# Patient Record
Sex: Male | Born: 1955 | Race: White | Hispanic: No | Marital: Married | Smoking: Never smoker
Health system: Southern US, Community
[De-identification: ages and names within clinical notes are randomized; demographics above are authoritative.]

## PROBLEM LIST (undated history)

## (undated) DIAGNOSIS — F419 Anxiety disorder, unspecified: Secondary | ICD-10-CM

## (undated) DIAGNOSIS — F32A Depression, unspecified: Secondary | ICD-10-CM

## (undated) DIAGNOSIS — I10 Essential (primary) hypertension: Secondary | ICD-10-CM

## (undated) DIAGNOSIS — C801 Malignant (primary) neoplasm, unspecified: Secondary | ICD-10-CM

## (undated) DIAGNOSIS — F429 Obsessive-compulsive disorder, unspecified: Secondary | ICD-10-CM

## (undated) DIAGNOSIS — F329 Major depressive disorder, single episode, unspecified: Secondary | ICD-10-CM

## (undated) HISTORY — PX: HERNIA REPAIR: SHX51

## (undated) HISTORY — PX: COLON SURGERY: SHX602

## (undated) HISTORY — PX: CHOLECYSTECTOMY: SHX55

---

## 2015-10-06 ENCOUNTER — Encounter (HOSPITAL_BASED_OUTPATIENT_CLINIC_OR_DEPARTMENT_OTHER): Payer: Self-pay | Admitting: *Deleted

## 2015-10-06 ENCOUNTER — Emergency Department (HOSPITAL_BASED_OUTPATIENT_CLINIC_OR_DEPARTMENT_OTHER)
Admission: EM | Admit: 2015-10-06 | Discharge: 2015-10-06 | Disposition: A | Discharge status: Home / Self Care | Payer: 59 | Attending: Emergency Medicine | Admitting: Emergency Medicine

## 2015-10-06 ENCOUNTER — Emergency Department (HOSPITAL_BASED_OUTPATIENT_CLINIC_OR_DEPARTMENT_OTHER): Payer: 59

## 2015-10-06 DIAGNOSIS — Z760 Encounter for issue of repeat prescription: Secondary | ICD-10-CM | POA: Insufficient documentation

## 2015-10-06 DIAGNOSIS — E876 Hypokalemia: Secondary | ICD-10-CM | POA: Diagnosis not present

## 2015-10-06 DIAGNOSIS — F419 Anxiety disorder, unspecified: Secondary | ICD-10-CM | POA: Diagnosis present

## 2015-10-06 DIAGNOSIS — R002 Palpitations: Secondary | ICD-10-CM | POA: Diagnosis not present

## 2015-10-06 HISTORY — DX: Depression, unspecified: F32.A

## 2015-10-06 HISTORY — DX: Essential (primary) hypertension: I10

## 2015-10-06 HISTORY — DX: Obsessive-compulsive disorder, unspecified: F42.9

## 2015-10-06 HISTORY — DX: Anxiety disorder, unspecified: F41.9

## 2015-10-06 HISTORY — DX: Major depressive disorder, single episode, unspecified: F32.9

## 2015-10-06 HISTORY — DX: Malignant (primary) neoplasm, unspecified: C80.1

## 2015-10-06 LAB — CBC WITH DIFFERENTIAL/PLATELET
Basophils Absolute: 0.1 10*3/uL (ref 0.0–0.1)
Basophils Relative: 0 %
EOS ABS: 0.1 10*3/uL (ref 0.0–0.7)
EOS PCT: 1 %
HCT: 45.1 % (ref 39.0–52.0)
Hemoglobin: 15.9 g/dL (ref 13.0–17.0)
LYMPHS ABS: 2.1 10*3/uL (ref 0.7–4.0)
LYMPHS PCT: 19 %
MCH: 31.3 pg (ref 26.0–34.0)
MCHC: 35.3 g/dL (ref 30.0–36.0)
MCV: 88.8 fL (ref 78.0–100.0)
MONO ABS: 1.1 10*3/uL — AB (ref 0.1–1.0)
MONOS PCT: 10 %
Neutro Abs: 7.9 10*3/uL — ABNORMAL HIGH (ref 1.7–7.7)
Neutrophils Relative %: 70 %
PLATELETS: 137 10*3/uL — AB (ref 150–400)
RBC: 5.08 MIL/uL (ref 4.22–5.81)
RDW: 13.4 % (ref 11.5–15.5)
WBC: 11.3 10*3/uL — AB (ref 4.0–10.5)

## 2015-10-06 LAB — MAGNESIUM: MAGNESIUM: 2 mg/dL (ref 1.7–2.4)

## 2015-10-06 LAB — BASIC METABOLIC PANEL
Anion gap: 8 (ref 5–15)
BUN: 13 mg/dL (ref 6–20)
CO2: 25 mmol/L (ref 22–32)
CREATININE: 0.75 mg/dL (ref 0.61–1.24)
Calcium: 9.1 mg/dL (ref 8.9–10.3)
Chloride: 107 mmol/L (ref 101–111)
GFR calc Af Amer: >60 mL/min (ref >60)
GLUCOSE: 123 mg/dL — AB (ref 65–99)
Potassium: 3.7 mmol/L (ref 3.5–5.1)
SODIUM: 140 mmol/L (ref 135–145)

## 2015-10-06 LAB — T4, FREE: Free T4: 1.2 ng/dL — ABNORMAL HIGH (ref 0.61–1.12)

## 2015-10-06 MED ORDER — HALOPERIDOL 1 MG PO TABS
1.0000 mg | ORAL_TABLET | Freq: Once | ORAL | Status: DC
  Filled 2015-10-06: qty 1

## 2015-10-06 MED ORDER — LORAZEPAM 1 MG PO TABS
1.0000 mg | ORAL_TABLET | Freq: Once | ORAL | Status: AC
  Administered 2015-10-06: 1 mg via ORAL
  Filled 2015-10-06: qty 1

## 2015-10-06 MED ORDER — DIPHENHYDRAMINE HCL 25 MG PO CAPS
50.0000 mg | ORAL_CAPSULE | Freq: Once | ORAL | Status: AC
  Administered 2015-10-06: 50 mg via ORAL
  Filled 2015-10-06: qty 2

## 2015-10-06 MED ORDER — POTASSIUM CHLORIDE CRYS ER 20 MEQ PO TBCR
40.0000 meq | EXTENDED_RELEASE_TABLET | Freq: Once | ORAL | Status: AC
  Administered 2015-10-06: 40 meq via ORAL
  Filled 2015-10-06: qty 2

## 2015-10-06 MED ORDER — LORAZEPAM 1 MG PO TABS
2.0000 mg | ORAL_TABLET | Freq: Once | ORAL | Status: DC
Start: 2015-10-06 — End: 2015-10-06

## 2015-10-06 MED ORDER — HALOPERIDOL 5 MG PO TABS
5.0000 mg | ORAL_TABLET | Freq: Once | ORAL | Status: AC
  Administered 2015-10-06: 5 mg via ORAL
  Filled 2015-10-06: qty 1

## 2015-10-06 MED ORDER — LORAZEPAM 1 MG PO TABS: 1.0000 mg | ORAL_TABLET | Freq: Two times a day (BID) | ORAL | Status: DC | PRN

## 2015-10-06 NOTE — ED Notes (Signed)
Denies sx, "feel better", steady gait, alert, NAD, calm, interactive, no dyspnea noted, out with family to d/c desk, given rx x1

## 2015-10-06 NOTE — ED Notes (Addendum)
C/o 1 week of increased and progressive anxiety, heart racing, palpitations, inability to sleep (difficulty falling asleep and staying asleep, sleeps about 4-5 hrs at a time).  Reports recent stressors (father died). Admits to h/o anxiety and depression. No relief tonight after taking lexapro10mg  at 2000, and clonazepam 0.5mg  and ambien 10mg  at 2300. Hand tremor and irregular pulse noted.  Denies ETOH smoking or drug use. Mentions some recent, but not current congestion.

## 2015-10-06 NOTE — ED Notes (Signed)
Dr. Claudine Mouton into room, family at [facility].

## 2015-10-06 NOTE — ED Notes (Signed)
Pt to xray, no changes, calmer, VSS.

## 2015-10-06 NOTE — ED Provider Notes (Signed)
CSN: [id number]     Arrival date & time 10/06/15  0404 History   First MD Initiated Contact with Patient 10/06/15 0410     Chief Complaint  Patient presents with  . Anxiety     (Consider location/radiation/quality/duration/timing/severity/associated sxs/prior Treatment) HPI  Joshua Eaton is a 60yo male, h/o anxiety, here with an attack. He states his symptoms  Have worsened over the last week. He hs had recent tress with his ather ho passed away in his mother who is in a nursing home. He denies alcohol or illicit drug use. He took several medications tonight including Lexapro, clonazepam, and Ambien to help with his sleeping and anxiety but they did not help.He is here requesting medication for this.  He does have palpitations but denies any pain  He has no SOB, vomiting or diaphoresis. He states this is consistent with his anxiety.   10 Systems reviewed and are negative for acute change except as noted in the HPI.   No past medical history on file. No past surgical history on file. No family history on file. Social History  Substance Use Topics  . Smoking status: Not on file  . Smokeless tobacco: Not on file  . Alcohol Use: Not on file    Review of Systems    Allergies  Review of patient's allergies indicates not on file.  Home Medications   Prior to Admission medications   Not on File   BP 180/100 mmHg  Pulse 92  Temp(Src) 98.4 F (36.9 C) (Oral)  Resp 18  Ht 5\' 10"  (1.778 m)  Wt 197 lb (89.359 kg)  BMI 28.27 kg/m2  SpO2 97% Physical Exam  Constitutional: He is oriented to person, place, and time. Vital signs are normal. He appears well-developed and well-nourished.  Non-toxic appearance. He does not appear ill. No distress.  HENT:  Head: Normocephalic and atraumatic.  Nose: Nose normal.  Mouth/Throat: Oropharynx is clear and moist. No oropharyngeal exudate.  Eyes: Conjunctivae and EOM are normal. Pupils are equal, round, and reactive to light. No scleral icterus.   Neck: Normal range of motion. Neck supple. No tracheal deviation, no edema, no erythema and normal range of motion present. No thyroid mass and no thyromegaly present.  Cardiovascular: Normal rate, regular rhythm, S1 normal, S2 normal, normal heart sounds, intact distal pulses and normal pulses.  Exam reveals no gallop and no friction rub.   No murmur heard. Pulmonary/Chest: Effort normal and breath sounds normal. No respiratory distress. He has no wheezes. He has no rhonchi. He has no rales.  Abdominal: Soft. Normal appearance and bowel sounds are normal. He exhibits no distension, no ascites and no mass. There is no hepatosplenomegaly. There is no tenderness. There is no rebound, no guarding and no CVA tenderness.  Musculoskeletal: Normal range of motion. He exhibits no edema or tenderness.  Lymphadenopathy:    He has no cervical adenopathy.  Neurological: He is alert and oriented to person, place, and time. He has normal strength. No cranial nerve deficit or sensory deficit.  Skin: Skin is warm, dry and intact. No petechiae and no rash noted. He is not diaphoretic. No erythema. No pallor.  Psychiatric:  anxious  Nursing note and vitals reviewed.   ED Course  Procedures (including critical care time) Labs Review Labs Reviewed  CBC WITH DIFFERENTIAL/PLATELET - Abnormal; Notable for the following:    WBC 11.3 (*)    Platelets 137 (*)    Neutro Abs 7.9 (*)    Monocytes Absolute 1.1 (*)  All other components within normal limits  BASIC METABOLIC PANEL - Abnormal; Notable for the following:    Glucose, Bld 123 (*)    All other components within normal limits  MAGNESIUM  TSH  T4, FREE    Imaging Review Dg Chest 2 View  10/06/2015  CLINICAL DATA:  Palpitations EXAM: CHEST  2 VIEW COMPARISON:  None. FINDINGS: Normal heart size and mediastinal contours. No acute infiltrate or edema. No effusion or pneumothorax. No acute osseous findings. Cholecystectomy clips IMPRESSION: No active  cardiopulmonary disease. Electronically Signed   By: Monte Fantasia M.D.   On: 10/06/2015 05:44   I have personally reviewed and evaluated these images and lab results as part of my medical decision-making.   EKG Interpretation None      MDM   Final diagnoses:  None   Patient given ativan, benadryl and haldol for relief, PO.  EKG show PVCs, potassium mildly low and was replaced.  Labs otherwise unremarkable.  Will follow up on thyroid studies.  Upon repeat evaluation, patient appears much more calm.  He is requesting ativan to go home with, he was given 6 tabs and instructed to stop taking the clonazepam completely.  VS remain within his normal limits and he is safe for DC with PCP fu within 3 Days.   He appears well and in NAD. VS remain within his normal limits and he is safe for DC.    Everlene Balls, MD 10/06/15 [phone]

## 2015-10-06 NOTE — Discharge Instructions (Signed)
Palpitations Mr. Chaplain, your EKG, blood work, and chest xray today were normal.  Take ativan as needed for your anxiety and see your doctor on Monday at your scheduled appointment.  Do NOT mix the clonazepam with the ativan.  Stop taking the clonazepam. If any symptoms worsen, come back to the ED Immediately. Thank you. A palpitation is the feeling that your heartbeat is irregular. It may feel like your heart is fluttering or skipping a beat. It may also feel like your heart is beating faster than normal. This is usually not a serious problem. In some cases, you may need more medical tests. HOME CARE  Avoid:  Caffeine in coffee, tea, soft drinks, diet pills, and energy drinks.  Chocolate.  Alcohol.  Stop smoking if you smoke.  Reduce your stress and anxiety. Try:  A method that measures bodily functions so you can learn to control them (biofeedback).  Yoga.  Meditation.  Physical activity such as swimming, jogging, or walking.  Get plenty of rest and sleep. GET HELP IF:  Your fast or irregular heartbeat continues after 24 hours.  Your palpitations occur more often. GET HELP RIGHT AWAY IF:   You have chest pain.  You feel short of breath.  You have a very bad headache.  You feel dizzy or pass out (faint). MAKE SURE YOU:   Understand these instructions.  Will watch your condition.  Will get help right away if you are not doing well or get worse.   This information is not intended to replace advice given to you by your health care provider. Make sure you discuss any questions you have with your health care provider.   Document Released: 05/26/2008 Document Revised: 09/07/2014 Document Reviewed: 10/16/2011 Elsevier Interactive Patient Education [employer].

## 2015-10-06 NOTE — ED Notes (Signed)
Dr. Claudine Mouton into room to update pt and family with results and plan. Pt up to b/r to void, steady gait.

## 2015-10-07 LAB — TSH: TSH: 1.07 u[IU]/mL (ref 0.350–4.500)

## 2015-12-05 ENCOUNTER — Encounter (HOSPITAL_COMMUNITY): Payer: Self-pay | Admitting: *Deleted

## 2015-12-05 ENCOUNTER — Inpatient Hospital Stay (HOSPITAL_COMMUNITY)
Admission: AD | Admit: 2015-12-05 | Discharge: 2015-12-11 | DRG: 881 | Disposition: A | Discharge status: Home / Self Care | Payer: 59 | Attending: Psychiatry | Admitting: Psychiatry

## 2015-12-05 DIAGNOSIS — I10 Essential (primary) hypertension: Secondary | ICD-10-CM | POA: Diagnosis present

## 2015-12-05 DIAGNOSIS — G4700 Insomnia, unspecified: Secondary | ICD-10-CM | POA: Diagnosis present

## 2015-12-05 DIAGNOSIS — F329 Major depressive disorder, single episode, unspecified: Principal | ICD-10-CM | POA: Diagnosis present

## 2015-12-05 DIAGNOSIS — F429 Obsessive-compulsive disorder, unspecified: Secondary | ICD-10-CM | POA: Diagnosis present

## 2015-12-05 DIAGNOSIS — F419 Anxiety disorder, unspecified: Secondary | ICD-10-CM | POA: Diagnosis not present

## 2015-12-05 DIAGNOSIS — F418 Other specified anxiety disorders: Secondary | ICD-10-CM | POA: Diagnosis not present

## 2015-12-05 DIAGNOSIS — F332 Major depressive disorder, recurrent severe without psychotic features: Secondary | ICD-10-CM | POA: Diagnosis not present

## 2015-12-05 DIAGNOSIS — F32A Depression, unspecified: Secondary | ICD-10-CM | POA: Diagnosis present

## 2015-12-05 MED ORDER — CLONAZEPAM 1 MG PO TABS
1.0000 mg | ORAL_TABLET | Freq: Three times a day (TID) | ORAL | Status: DC
  Administered 2015-12-05 – 2015-12-06 (×2): 1 mg via ORAL
  Filled 2015-12-05 (×2): qty 1

## 2015-12-05 MED ORDER — ACETAMINOPHEN 325 MG PO TABS: 650.0000 mg | ORAL_TABLET | Freq: Four times a day (QID) | ORAL | Status: DC | PRN

## 2015-12-05 MED ORDER — ZOLPIDEM TARTRATE 5 MG PO TABS
15.0000 mg | ORAL_TABLET | Freq: Every evening | ORAL | Status: DC | PRN
  Administered 2015-12-05: 15 mg via ORAL
  Filled 2015-12-05: qty 1

## 2015-12-05 MED ORDER — ALUM & MAG HYDROXIDE-SIMETH 200-200-20 MG/5ML PO SUSP: 30.0000 mL | ORAL | Status: DC | PRN

## 2015-12-05 MED ORDER — CLONAZEPAM 0.5 MG PO TABS: 0.5000 mg | ORAL_TABLET | Freq: Two times a day (BID) | ORAL | Status: DC | PRN

## 2015-12-05 MED ORDER — ESCITALOPRAM OXALATE 10 MG PO TABS
10.0000 mg | ORAL_TABLET | Freq: Every day | ORAL | Status: DC
  Filled 2015-12-05 (×2): qty 1

## 2015-12-05 MED ORDER — CLONAZEPAM 1 MG PO TABS
1.0000 mg | ORAL_TABLET | Freq: Three times a day (TID) | ORAL | Status: DC
  Filled 2015-12-05: qty 1

## 2015-12-05 MED ORDER — CLONAZEPAM 1 MG PO TABS
1.0000 mg | ORAL_TABLET | Freq: Once | ORAL | Status: AC
  Administered 2015-12-05: 1 mg via ORAL
  Filled 2015-12-05: qty 1

## 2015-12-05 MED ORDER — MAGNESIUM HYDROXIDE 400 MG/5ML PO SUSP: 30.0000 mL | Freq: Every day | ORAL | Status: DC | PRN

## 2015-12-05 MED ORDER — CLONAZEPAM 0.5 MG PO TABS: 0.5000 mg | ORAL_TABLET | Freq: Three times a day (TID) | ORAL | Status: DC | PRN

## 2015-12-05 MED ORDER — ZOLPIDEM TARTRATE 5 MG PO TABS: 15.0000 mg | ORAL_TABLET | Freq: Every evening | ORAL | Status: DC | PRN

## 2015-12-05 MED ORDER — HYDROXYZINE HCL 50 MG PO TABS
50.0000 mg | ORAL_TABLET | Freq: Three times a day (TID) | ORAL | Status: DC | PRN
  Administered 2015-12-05 – 2015-12-09 (×6): 50 mg via ORAL
  Filled 2015-12-05 (×6): qty 1

## 2015-12-05 MED ORDER — ZOLPIDEM TARTRATE 10 MG PO TABS: 10.0000 mg | ORAL_TABLET | Freq: Every evening | ORAL | Status: DC | PRN

## 2015-12-05 NOTE — Progress Notes (Signed)
Admission Note:  D- 60 yr old male who presents as a walk-in, in no acute distress, for the treatment of SI and anxiety. Patient appears anxious and depressed. Patient was cooperative with admission process. Patient presents with passive SI without a plan and contracts for safety upon admission. Patient reports, prior to admission, he was having "uneasy feelings" and "obessive thoughts to harm myself".  Patient reports "I didn't want to be left alone by myself".  Patient denies AVH . Patient reports multiple stressors.  Patient reports that at the beginning of the year both of his parents and his sister passed away in an 32 day period.  Patient also reports, 2 days after his parent's death, he was notified that he had a diagnosis of Macular Degeneration.  Patient currently reports minimal sight in his left eye.  Patient reports taking Areds for his eyes.  Patient reports that he was previously taking Zoloft to help with his OCD but was recently switched to Zyprexa.  Patient states that he had taken 5 doses of Zyprexa, as prescribed, and began feeling "dizzy and lightheaded".  Patient states "the medication set something off in my head and now everyone thinks I'm looney".  Patient reports that he had a CT Scan on Saturday and it came back "clear".    Patient has past medical Hx of irregular heart beat, OCD, and anxiety.  Patient reports that he was diagnosed with colon cancer 10 years ago which resulted in doctors removing part of his colon.  Patient reports decreased appetite which has resulted in him losing 8-10lbs in "the last couple of weeks".  Patient reports poor sleep and reports sleeping an average of 3 hours per night.  Denies tobacco, drug, or alcohol use.  Denies physical, verbal, or sexual abuse.   A- Skin was assessed and found to be clear of any abnormal marks apart from a scab on his right arm which patient reports is from "picking" at dry skin. Patient searched and no contraband found, POC and unit  policies explained and understanding verbalized. Consents obtained. Food and fluids offered and fluids accepted.  R- Patient had no additional questions or concerns.

## 2015-12-05 NOTE — Progress Notes (Signed)
Adult Psychoeducational Group Note  Date:  12/05/2015 Time:  9:25 PM  Group Topic/Focus:  Wrap-Up Group:   The focus of this group is to help patients review their daily goal of treatment and discuss progress on daily workbooks.  Participation Level:  Active  Participation Quality:  Appropriate  Affect:  Appropriate  Cognitive:  Alert  Insight: Appropriate  Engagement in Group:  Engaged  Modes of Intervention:  Discussion  Additional Comments:  Patient goal for today was to get to [name]. Patient stated his anxiety is bad. On a scale between 1-10, (1=worse, 10=best) patient rated his day a 2.  [name] [name] 12/05/2015, 9:25 PM

## 2015-12-05 NOTE — BH Assessment (Addendum)
Tele Assessment Note   Joshua Eaton is an 60 y.o. male. Pt presents voluntarily to [facility] for assessment accompanied by his wife of 84 years, Joshua Eaton. Pt endorses SI with severe anxiety and panic attacks. Pt reports he has been inpatient at [facility] twice over the past six mos. He reports he is currently having "thoughts of harming myself.". Pt denies hx of suicide attempts. He is unable to contract for safety. He endorses severe anxiety with panic attacks. Pt reports his anxiety is greatly affecting his quality of life. Pt reports he doesn't leave his house anymore and doesn't interact with 18 yo grandson like he used to. He endorses severe anxiety during assessment and reports he is afraid he may have a panic attack. He says, "My anxiety just takes over. It is terrible." He endorses insomnia, poor appetite, loss of interest in usual pleasures, hopelessness, isolating bx and irritability. Pt says he has been having more SI lately. Pt reports his wife has had to stay home with him the past week d/t his anxiety. He says he is afraid to be alone. He says he has seen Dr [facility] Eaton twice recently for med management. He reports Dr [facility] Eaton is his psychaitrist, however in EPIC Dr [facility] E[facility] is listed as his PCP. Pt reports Dr Joshua Eaton prescribes Clonazepam 1 mg. Pt reports having OCD. He denies any compulsive behavior.   Wife reports pt's mood has been "doom and gloom." She says pt is won't leave the house. Wife reports pt used to be outgoing and very social. She says pt's father, sister and mother have all died in the past three months. She also reports pt has recently been dx with macular degeneration. Wife says pt's anxiety and depression worsened approx six months ago when his parents' health began going quickly downhill. She reports pt is very sensitive to medications. She says she is worried as [facility] professionals haven't seemed to be able to find psych meds which will decrease pt's anxiety.   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   Past Medical History:  Past Medical History  Diagnosis Date  . Anxiety   . Depression   . OCD (obsessive compulsive disorder)   . Hypertension   . Cancer [facility])     Past Surgical History  Procedure Laterality Date  . Colon surgery      cancerous polyp removed  . Cholecystectomy    . Hernia repair      Family History: No family history on file.  Social History:  reports that he has never smoked. He does not have any smokeless tobacco history on file. He reports that he does not drink alcohol or use illicit drugs.  Additional Social History:  Alcohol / Drug Use Pain Medications: pt denies abuse = see PTA meds list Prescriptions: pt denies abuse = see PTA meds list Over the Counter: pt denies abuse -see PTA meds list History of alcohol / drug use?: No history of alcohol / drug abuse Longest period of sobriety (when/how long): n/a  CIWA:   COWS:    PATIENT STRENGTHS: (choose at least two) Ability for insight Average or above average intelligence Communication skills Physical Health Supportive family/friends  Allergies:  Allergies  Allergen Reactions  . Codeine     "sends me into orbit"    Home Medications:  Medications Prior to Admission  Medication Sig Dispense Refill  . escitalopram (LEXAPRO) 10 MG tablet Take 10 mg by  mouth daily.    Marland Kitchen LORazepam (ATIVAN) 1 MG tablet Take 1 tablet (1 mg total) by mouth 2 (two) times daily as needed for anxiety. 6 tablet 0  . zolpidem (AMBIEN) 10 MG tablet Take 10 mg by mouth at bedtime as needed for sleep.      OB/GYN Status:  No LMP for male patient.  General Assessment Data Location of Assessment: [facility] TTS Assessment: In system Is this a Tele or Face-to-Face Assessment?: Face-to-Face Is this an Initial Assessment or a Re-assessment for this encounter?: Initial Assessment Marital  status: Married Is patient pregnant?: No Pregnancy Status: No Living Arrangements: Spouse/significant other (wife of 65 yrs) Can pt return to current living arrangement?: Yes Admission Status: Voluntary Is patient capable of signing voluntary admission?: Yes Referral Source: Self/Family/Friend Insurance type: united healthcare     Crisis Care Plan Living Arrangements: Spouse/significant other (wife of 63 yrs) Name of Psychiatrist: Dr Joshua Eaton (has only seen MD twice) Name of Therapist: none  Education Status Is patient currently in school?: No Highest grade of school patient has completed: 9  Risk to self with the past 6 months Suicidal Ideation: Yes-Currently Present Has patient been a risk to self within the past 6 months prior to admission? : No Suicidal Intent: No Has patient had any suicidal intent within the past 6 months prior to admission? : No Is patient at risk for suicide?: Yes Suicidal Plan?: No Has patient had any suicidal plan within the past 6 months prior to admission? : No Access to Means: No What has been your use of drugs/alcohol within the last 12 months?: none Previous Attempts/Gestures: No How many times?: 0 Other Self Harm Risks: none Triggers for Past Attempts:  (n/a) Intentional Self Injurious Behavior: None Family Suicide History: No (fam hx of mental illness and substance absue) Recent stressful life event(s): Loss (Comment) (mom, sister, dad died b/t 2015/09/24 & 2015-11-22) Persecutory voices/beliefs?: No Depression: Yes Depression Symptoms: Tearfulness, Feeling angry/irritable, Loss of interest in usual pleasures, Fatigue, Isolating, Despondent Substance abuse history and/or treatment for substance abuse?: No Suicide prevention information given to non-admitted patients: Yes  Risk to Others within the past 6 months Homicidal Ideation: No Does patient have any lifetime risk of violence toward others beyond the six months prior to admission? :  No Thoughts of Harm to Others: No Current Homicidal Intent: No Current Homicidal Plan: No Access to Homicidal Means: No Identified Victim: none History of harm to others?: No Assessment of Violence: None Noted Violent Behavior Description: pt denies hx violence  Does patient have access to weapons?: No Criminal Charges Pending?: No Does patient have a court date: No Is patient on probation?: No  Psychosis Hallucinations: None noted Delusions: None noted  Mental Status Report Appearance/Hygiene: Unremarkable (in appropriate street clothing) Eye Contact: Fair Motor Activity: Freedom of movement Speech: Logical/coherent Level of Consciousness: Alert Mood: Depressed, Anxious, Sad, Anhedonia Affect: Appropriate to circumstance Anxiety Level: Panic Attacks Panic attack frequency: daily Most recent panic attack: today Thought Processes: Relevant, Coherent Judgement: Unimpaired Orientation: Person, Place, Time, Situation Obsessive Compulsive Thoughts/Behaviors: Moderate (yes per pt)  Cognitive Functioning Concentration: Normal Memory: Recent Intact, Remote Intact IQ: Average Insight: Fair Impulse Control: Fair Appetite: Poor Sleep: No Change Total Hours of Sleep: 3 Vegetative Symptoms: None  ADLScreening [facility] Assessment Services) Patient's cognitive ability adequate to safely complete daily activities?: Yes Patient able to express need for assistance with ADLs?: Yes Independently performs ADLs?: Yes (appropriate for developmental age)  Prior Inpatient  Therapy Prior Inpatient Therapy: Yes Prior Therapy Dates: in past year Prior Therapy Facilty/Provider(s): [facility] Reason for Treatment: anxiety, depression, SI  Prior Outpatient Therapy Prior Outpatient Therapy: Yes Prior Therapy Dates: two appointments so far Prior Therapy Facilty/Provider(s): Dr Joshua Eaton Reason for Treatment: med management Does patient have an ACCT team?: No Does patient have Intensive  In-House Services?  : No Does patient have [facility] services? : No Does patient have P4CC services?: No  ADL Screening (condition at time of admission) Patient's cognitive ability adequate to safely complete daily activities?: Yes Is the patient deaf or have difficulty hearing?: No Does the patient have difficulty seeing, even when wearing glasses/contacts?: No Does the patient have difficulty concentrating, remembering, or making decisions?: No Patient able to express need for assistance with ADLs?: Yes Does the patient have difficulty dressing or bathing?: No Independently performs ADLs?: Yes (appropriate for developmental age) Does the patient have difficulty walking or climbing stairs?: No Weakness of Legs: None Weakness of Arms/Hands: None  Home Assistive Devices/Equipment Home Assistive Devices/Equipment: None    Abuse/Neglect Assessment (Assessment to be complete while patient is alone) Physical Abuse: Denies Verbal Abuse: Denies Sexual Abuse: Denies Exploitation of patient/patient's resources: Denies Self-Neglect: Denies     Regulatory affairs officer (For Healthcare) Does patient have an advance directive?: No Would patient like information on creating an advanced directive?: No - patient declined information    Additional Information 1:1 In Past 12 Months?: No CIRT Risk: No Elopement Risk: No Does patient have medical clearance?: No     Disposition:  Disposition Initial Assessment Completed for this Encounter: Yes Disposition of Patient: Inpatient treatment program Type of inpatient treatment program: Adult (conrad withrow DNP accepts to 407-1)  [place], Adrina Armijo P 12/05/2015 2:27 PM

## 2015-12-05 NOTE — Tx Team (Signed)
Initial Interdisciplinary Treatment Plan   PATIENT STRESSORS: Health problems Loss of family members Medication change or noncompliance Recent health diagnosis   PATIENT STRENGTHS: Ability for insight Communication skills Motivation for treatment/growth Supportive family/friends   PROBLEM LIST: Problem List/Patient Goals Date to be addressed Date deferred Reason deferred Estimated date of resolution  At risk for suicide 12/05/2015  12/05/2015   D/C  Depression 12/05/2015  12/05/2015   D/C  Anxiety 12/05/2015  12/05/2015   D/C  "Relief from OCD" 12/05/2015  12/05/2015   D/C  "Try not to think about recent losses" 12/05/2015  12/05/2015   D/C                           DISCHARGE CRITERIA:  Ability to meet basic life and health needs Improved stabilization in mood, thinking, and/or behavior Medical problems require only outpatient monitoring Motivation to continue treatment in a less acute level of care Need for constant or close observation no longer present Reduction of life-threatening or endangering symptoms to within safe limits  PRELIMINARY DISCHARGE PLAN: Outpatient therapy Return to previous living arrangement Return to previous work or school arrangements  PATIENT/FAMIILY INVOLVEMENT: This treatment plan has been presented to and reviewed with the patient, Trendyn Arrellano.  The patient and family have been given the opportunity to ask questions and make suggestions.  Donne Hazel P 12/05/2015, 4:23 PM

## 2015-12-06 DIAGNOSIS — F418 Other specified anxiety disorders: Secondary | ICD-10-CM

## 2015-12-06 MED ORDER — ZOLPIDEM TARTRATE 5 MG PO TABS
5.0000 mg | ORAL_TABLET | Freq: Every evening | ORAL | Status: DC | PRN
  Filled 2015-12-06: qty 1

## 2015-12-06 MED ORDER — ADULT MULTIVITAMIN W/MINERALS CH
1.0000 | ORAL_TABLET | Freq: Once | ORAL | Status: AC
  Administered 2015-12-06: 1 via ORAL
  Filled 2015-12-06: qty 1

## 2015-12-06 MED ORDER — CLONAZEPAM 1 MG PO TABS
1.0000 mg | ORAL_TABLET | ORAL | Status: DC
  Administered 2015-12-06 – 2015-12-11 (×15): 1 mg via ORAL
  Filled 2015-12-06 (×15): qty 1

## 2015-12-06 MED ORDER — ZOLPIDEM TARTRATE 10 MG PO TABS
10.0000 mg | ORAL_TABLET | Freq: Every evening | ORAL | Status: DC | PRN
Start: 2015-12-06 — End: 2015-12-08
  Administered 2015-12-06 – 2015-12-07 (×2): 10 mg via ORAL
  Filled 2015-12-06 (×2): qty 1

## 2015-12-06 MED ORDER — CLONAZEPAM 1 MG PO TABS
1.0000 mg | ORAL_TABLET | ORAL | Status: DC
Start: 2015-12-07 — End: 2015-12-06
  Administered 2015-12-06: 1 mg via ORAL
  Filled 2015-12-06: qty 1

## 2015-12-06 MED ORDER — VITAMIN E 180 MG (400 UNIT) PO CAPS
400.0000 [IU] | ORAL_CAPSULE | Freq: Once | ORAL | Status: AC
  Administered 2015-12-06: 400 [IU] via ORAL
  Filled 2015-12-06: qty 1

## 2015-12-06 MED ORDER — PAROXETINE HCL 20 MG PO TABS
20.0000 mg | ORAL_TABLET | Freq: Every day | ORAL | Status: DC
  Administered 2015-12-07 – 2015-12-08 (×2): 20 mg via ORAL
  Filled 2015-12-06 (×4): qty 1

## 2015-12-06 MED ORDER — ICAPS AREDS 2 PO CAPS
2.0000 | ORAL_CAPSULE | Freq: Two times a day (BID) | ORAL | Status: DC
  Administered 2015-12-06: 2 via ORAL
  Administered 2015-12-07 – 2015-12-08 (×4): 1 via ORAL
  Administered 2015-12-09 – 2015-12-10 (×3): 2 via ORAL
  Filled 2015-12-06 (×4): qty 1

## 2015-12-06 NOTE — Progress Notes (Signed)
Pt is new to the unit this evening.  He reports that he is really anxious about being here.  He states he has been having panic attacks and hopes the MD can find medication that will help him not feel that way.  He has had three deaths in his family that have affected him greatly.  He is worried about being able to sleep tonight.  Writer reviewed his medications for the evening.  He was encouraged to make his needs known to staff.  Support and encouragement offered.  Discharge plans are in process.  Safety maintained with q15 minute checks.

## 2015-12-06 NOTE — Progress Notes (Signed)
[name] is seen OOB UAL on the 400 hall today..he tolerates this fair. He is quite verbal about getting his klonopin " on time" . He completed his daily assessment today and on it he wrote  He denied SI today and  He rated his depression, hopelessness and anxiety " 06/09/09", respectively. A per his requests, the  klonopin is scheduled for 0800, 1500 and bedtime, his room is changed to one with a hospital bed ( " for my back") and he is attending his groups as scheduled. R Safety in place.

## 2015-12-06 NOTE — H&P (Addendum)
Psychiatric Admission Assessment Adult  Patient Identification: Joshua Eaton MRN:  [id number] Date of Evaluation:  12/06/2015 Chief Complaint:   " I think it is my OCD which is driving all  Of this " Principal Diagnosis: OCD by history, Suicidal Ideations  Diagnosis:   Patient Active Problem List   Diagnosis Date Noted  . Anxiety and depression [F41.8] 12/05/2015   History of Present Illness:: 60 year old man, reports worsening depression and anxiety,  which he attributes partially to the recent death of his  mother, father, and sister, who passed away  within a few weeks of one another earlier this year. He also states that he has been diagnosed with Macular Degeneration and has been ruminating about possibly losing his eye sight  He reports having a history of OCD,  reports long history of obsessions ( does not describe any clear compulsions), and states he has had these constant " intrusive thoughts " of suicide , in spite of not actually having a plan or  intention to do so. Identifies these suicidal ruminations more as an obsessive thought than as a function of depression and again denies  an actual plan or intention . Associated Signs/Symptoms: Depression Symptoms:  insomnia, anxiety, panic attacks, loss of energy/fatigue,  Of note, states " I don't think I'm that depressed, mostly I am anxious ". Denies anhedonia or persistent sense of sadness.  (Hypo) Manic Symptoms:   Does not endorse  Anxiety Symptoms:  Describes panic attacks, tendency to worry excessively.  Psychotic Symptoms:   Denies psychotic symptoms  PTSD Symptoms: does not endorse   Total Time spent with patient: 45 minutes  Past Psychiatric History: history of chronic anxiety and  states he has had previous episodes of depression . States he has never attempted suicide.  Denies history of self injurious ideations, denies any history of violence. States he has been diagnosed with anxiety, and OCD . States he has been  hospitalized at [facility]  Earlier this year for " having these obsessive thoughts of suicide or sometimes about hurting someone, although denies any actual history of violence or any actual plan or intention of violence ever. Does not endorse any clear history of mania or hypomania.   Is the patient at risk to self? Yes.    Has the patient been a risk to self in the past 6 months? No.  Has the patient been a risk to self within the distant past? No.  Is the patient a risk to others? No.  Has the patient been a risk to others in the past 6 months? No.  Has the patient been a risk to others within the distant past? No.   Prior Inpatient Therapy: Prior Inpatient Therapy: Yes Prior Therapy Dates: in past year Prior Therapy Facilty/Provider(s): [facility] Reason for Treatment: anxiety, depression, SI Prior Outpatient Therapy: Prior Outpatient Therapy: Yes Prior Therapy Dates: two appointments so far Prior Therapy Facilty/Provider(s): Dr Rex Kras Reason for Treatment: med management Does patient have an ACCT team?: No Does patient have Intensive In-House Services?  : No Does patient have [facility] services? : No Does patient have P4CC services?: No  Alcohol Screening: 1. How often do you have a drink containing alcohol?: Never 9. Have you or someone else been injured as a result of your drinking?: No 10. Has a relative or friend or a doctor or another health worker been concerned about your drinking or suggested you cut down?: No Alcohol Use Disorder Identification Test Final Score (  AUDIT): 0 Brief Intervention: AUDIT score less than 7 or less-screening does not suggest unhealthy drinking-brief intervention not indicated Substance Abuse History in the last 12 months: denies alcohol or drug abuse  Consequences of Substance Abuse: Denies  Previous Psychotropic Medications:  He states he has recently been on Zoloft, Zyprexa , Klonopin, Ambien.  He remembers Zyprexa as initially  helpful but had stopped it due to feeling it was causing some poorly specified neurologic symptoms. Remembers Paxil trial as helpful in the past . Regarding Klonopin, states he has been taking 1 mgr TID prior to admission .   Psychological Evaluations:  No  Past Medical History:  Past Medical History  Diagnosis Date  . Anxiety   . Depression   . OCD (obsessive compulsive disorder)   . Hypertension   . Cancer [facility])     Past Surgical History  Procedure Laterality Date  . Colon surgery      cancerous polyp removed  . Cholecystectomy    . Hernia repair     Family History:parents passed away recently .  Family Psychiatric  History:  Sister had history of OCD, another sister had history of opiate abuse, no history of suicides in the family  Tobacco Screening: does not smoke  Social History: Married x 83 years, has an adult son, self employed as a Barrister's clerk , but not working at this time  , denies legal or financial issues .  Describes boredom and limited daily structure since he stopped working  History  Alcohol Use No     History  Drug Use No    Additional Social History: Marital status: Married    Pain Medications: pt denies abuse = see PTA meds list Prescriptions: pt denies abuse = see PTA meds list Over the Counter: pt denies abuse -see PTA meds list History of alcohol / drug use?: No history of alcohol / drug abuse Longest period of sobriety (when/how long): n/a  Allergies:   Allergies  Allergen Reactions  . Codeine     "sends me into orbit"   Lab Results: No results found for this or any previous visit (from the past 48 hour(s)).  Blood Alcohol level:  No results found for: [facility]  Metabolic Disorder Labs:  No results found for: HGBA1C, MPG No results found for: PROLACTIN No results found for: CHOL, TRIG, HDL, CHOLHDL, VLDL, LDLCALC  Current Medications: Current Facility-Administered Medications  Medication Dose Route Frequency Provider Last Rate Last Dose  .  acetaminophen (TYLENOL) tablet 650 mg  650 mg Oral Q6H PRN [name], NP      . alum & mag hydroxide-simeth (MAALOX/MYLANTA) 200-200-20 MG/5ML suspension 30 mL  30 mL Oral Q4H PRN [name], NP      . clonazePAM Bobbye Charleston) tablet 1 mg  1 mg Oral TID Benjamine Mola, FNP   1 mg at 12/06/15 0814  . hydrOXYzine (ATARAX/VISTARIL) tablet 50 mg  50 mg Oral TID PRN [name], NP   50 mg at 12/05/15 2207  . ICAPS AREDS 2 CAPS 2 capsule  2 capsule Oral BID Myer Peer Cobos, MD      . magnesium hydroxide (MILK OF MAGNESIA) suspension 30 mL  30 mL Oral Daily PRN [name], NP      . multivitamin with minerals tablet 1 tablet  1 tablet Oral Once [name], MD      . vitamin E capsule 400 Units  400 Units Oral Once [name], MD      .  zolpidem (AMBIEN) tablet 15 mg  15 mg Oral QHS PRN Benjamine Mola, FNP   15 mg at 12/05/15 2207   PTA Medications: Prescriptions prior to admission  Medication Sig Dispense Refill Last Dose  . clonazePAM (KLONOPIN) 1 MG tablet Take 1 mg by mouth 3 (three) times daily.   Past Week at Unknown time  . Multiple Vitamins-Minerals (ICAPS AREDS 2) CAPS Take 1 tablet by mouth 2 (two) times daily.   12/04/2015  . zolpidem (AMBIEN) 10 MG tablet Take 15 mg by mouth at bedtime as needed for sleep.    Past Week at Unknown time    Musculoskeletal: Strength & Muscle Tone: within normal limits Gait & Station: normal Patient leans: N/A  Psychiatric Specialty Exam: Physical Exam  Review of Systems  Constitutional: Negative.   Eyes:       States decrease in visual acuity due to macular degeneration   Respiratory: Negative.   Cardiovascular: Negative.   Gastrointestinal: Negative for nausea, vomiting, abdominal pain, diarrhea, constipation and blood in stool.  Skin: Negative.   Neurological: Negative for seizures.  Psychiatric/Behavioral: Positive for suicidal ideas. The patient is nervous/anxious.   All other systems reviewed and are negative.    Blood pressure 119/84, pulse 91, temperature 98.4 F (36.9 C), temperature source Oral, resp. rate 18, height 5' 9.75" (1.772 m), weight 197 lb (89.359 kg).Body mass index is 28.46 kg/(m^2).  General Appearance: Fairly Groomed  Engineer, water::  Good  Speech:  Normal Rate  Volume:  Normal  Mood:  depressed, anxious   Affect:  anxious   Thought Process:  Linear  Orientation:  Other:  fully alert and attentive oriented x 3   Thought Content:  Rumination and denies any hallucinations, not internally preoccupied , no delusions   Suicidal Thoughts:  No states " I do not want to hurt myself ", denies plan or intention of hurting self or of SI. Contracts for safety on the unit   Homicidal Thoughts:  No denies any homicidal ideations, specifically denies any violent or homicidal ideations towards wife or family    Memory:  recent and remote grossly intact   Judgement:  Other:  fair   Insight:  Fair  Psychomotor Activity:  Normal  Concentration:  Good  Recall:  Good  Fund of Knowledge:Good  Language: Good  Akathisia:  Negative  Handed:  Right  AIMS (if indicated):     Assets:  Desire for Improvement Housing Physical Health Resilience  ADL's:  Intact  Cognition: WNL  Sleep:  Number of Hours: 5.5     Treatment Plan Summary: Daily contact with patient to assess and evaluate symptoms and progress in treatment, Medication management, Plan inpatient treatment  and medications as below   Observation Level/Precautions:  15 minute checks  Laboratory:  As needed   Psychotherapy: milieu, support    Medications:   On klonopin 1 mgrs TID for anxiety.  Would continue this  treatment at this time. We have reviewed side effects, addictive potential, sedating and cognitive side effects. Patient agrees to start Paxil, which he states he has been on before and remembers as possibly effective and well tolerated .  Start Paxil 20 mgrs QAM  Will taper Ambien from 15 mgrs QHS to 5 mgrs QHS PRN to minimize  risk of excessive sedation and interactions with BZD   Consultations:  As needed   Discharge Concerns:  -   Estimated LOS: 5 days   Other:     I certify that inpatient  services furnished can reasonably be expected to improve the patient's condition.    Neita Garnet, MD 4/7/201711:56 AM

## 2015-12-06 NOTE — BHH Suicide Risk Assessment (Addendum)
[facility] Admission Suicide Risk Assessment   Nursing information obtained from:  Patient Demographic factors:  Male, Caucasian, Unemployed, Access to firearms Current Mental Status:  Self-harm thoughts Loss Factors:  Loss of significant relationship, Decline in physical health Historical Factors:  Family history of mental illness or substance abuse Risk Reduction Factors:  Living with another person, especially a relative, Positive social support  Total Time spent with patient: 45 minutes Principal Problem:  Depression, Anxiety- OCD by history  Diagnosis:   Patient Active Problem List   Diagnosis Date Noted  . Anxiety and depression [F41.8] 12/05/2015     Continued Clinical Symptoms:  Alcohol Use Disorder Identification Test Final Score (AUDIT): 0 The "Alcohol Use Disorders Identification Test", Guidelines for Use in Primary Care, Second Edition.  World Pharmacologist [facility]). Score between 0-7:  no or low risk or alcohol related problems. Score between 8-15:  moderate risk of alcohol related problems. Score between 16-19:  high risk of alcohol related problems. Score 20 or above:  warrants further diagnostic evaluation for alcohol dependence and treatment.   CLINICAL FACTORS:   Patient is a 60 year old male, long history of anxiety, states specifically he has dealt with OCD symptoms for years, reports worsening anxiety, some depression and obsessive , intrusive thoughts of suicide , but without plan or intention . He has been facing significant stressors, mainly the death of mother, father, and sister recently .     Psychiatric Specialty Exam: ROS  Blood pressure 119/84, pulse 91, temperature 98.4 F (36.9 C), temperature source Oral, resp. rate 18, height 5' 9.75" (1.772 m), weight 197 lb (89.359 kg).Body mass index is 28.46 kg/(m^2).   see admit note MSE                                                       COGNITIVE FEATURES THAT CONTRIBUTE TO RISK:   Closed-mindedness and Loss of executive function    SUICIDE RISK:   Moderate:  Frequent suicidal ideation with limited intensity, and duration, some specificity in terms of plans, no associated intent, good self-control, limited dysphoria/symptomatology, some risk factors present, and identifiable protective factors, including available and accessible social support.  PLAN OF CARE: Patient will be admitted to inpatient psychiatric unit for stabilization and safety. Will provide and encourage milieu participation. Provide medication management and maked adjustments as needed.  Will follow daily.    I certify that inpatient services furnished can reasonably be expected to improve the patient's condition.   Neita Garnet, MD 12/06/2015, 2:16 PM

## 2015-12-06 NOTE — Progress Notes (Signed)
Adult Psychoeducational Group Note  Date:  12/06/2015 Time:  9:17 PM  Group Topic/Focus:  Wrap-Up Group:   The focus of this group is to help patients review their daily goal of treatment and discuss progress on daily workbooks.  Participation Level:  Active  Participation Quality:  Appropriate  Affect:  Appropriate  Cognitive:  Alert  Insight: Appropriate  Engagement in Group:  Engaged  Modes of Intervention:  Discussion  Additional Comments:  Patient stated having an "alright day". Patient goal for today was to get sleep.   Deanie Jupiter L Stormy Connon 12/06/2015, 9:17 PM

## 2015-12-06 NOTE — Progress Notes (Addendum)
Patient ID: Joshua Eaton, male   DOB: 1956/06/18, 60 y.o.   MRN: [id number] D: Client visible on the unit, in dayroom watching TV, interacts appropriately with staff and peers. Client reports wife brought Vitamins for his eyes. "day has been pretty good, tired of this negative talk" "this cursing getting on my nerves"  A: Writer provided emotional support. Medications retrieved, reviewed and administered. Client encouaged to reports to staff any offensive language. Staff will monitor q42min for safety. R: Client is safe on the unit, attended group.

## 2015-12-06 NOTE — BHH Group Notes (Signed)
[place] LCSW Group Therapy 12/06/2015 1:15pm  Type of Therapy: Group Therapy- Feelings Around Relapse and Recovery  Participation Level: Active   Participation Quality:  Appropriate  Affect:  Appropriate  Cognitive: Alert and Oriented   Insight:  Developing   Engagement in Therapy: Developing/Improving and Engaged   Modes of Intervention: Clarification, Confrontation, Discussion, Education, Exploration, Limit-setting, Orientation, Problem-solving, Rapport Building, Art therapist, Socialization and Support  Summary of Progress/Problems: The topic for today was feelings about relapse. The group discussed what relapse prevention is to them and identified triggers that they are on the path to relapse. Members also processed their feeling towards relapse and were able to relate to common experiences. Group also discussed coping skills that can be used for relapse prevention.  Pt participated actively, however participation was off-topic at times and somewhat tangential. He discussed not finding pleasure in activities that he enjoys and expressed that grief was the primary trigger for this relapse.   Therapeutic Modalities:   Cognitive Behavioral Therapy Solution-Focused Therapy Assertiveness Training Relapse Prevention Therapy    Norman Clay [id number] 12/06/2015 4:32 PM

## 2015-12-06 NOTE — BHH Group Notes (Signed)
[facility] LCSW Aftercare Discharge Planning Group Note  12/06/2015 8:45 AM  Participation Quality: Alert, Appropriate and Oriented  Mood/Affect: Irritable  Depression Rating: "okay"  Anxiety Rating: "25"  Thoughts of Suicide: Pt denies SI/HI  Will you contract for safety? Yes  Current AVH: Pt denies  Plan for Discharge/Comments: Pt attended discharge planning group and actively participated in group. CSW discussed suicide prevention education with the group and encouraged them to discuss discharge planning and any relevant barriers. Pt expressed that he did not sleep well and is not pleased with the beds here at the hospital.  Transportation Means: Pt reports access to transportation  Supports: No supports mentioned at this time  Peri Maris, [place] 12/06/2015 9:51 AM
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
## 2015-12-06 NOTE — Tx Team (Signed)
Interdisciplinary Treatment Plan Update (Adult) Date: 12/06/2015   Date: 12/06/2015 4:36 PM  Progress in Treatment:  Attending groups: Yes  Participating in groups: Yes  Taking medication as prescribed: Yes  Tolerating medication: Yes  Family/Significant othe contact made: No, CSW attempting to make contact with wife Patient understands diagnosis: yes AEB seeking help with his depression and OCD Discussing patient identified problems/goals with staff: Yes  Medical problems stabilized or resolved: Yes  Denies suicidal/homicidal ideation: yes Patient has not harmed self or Others: Yes   New problem(s) identified: None identified at this time.   Discharge Plan or Barriers: Pt will return home and follow-up with outpatient services  Additional comments:  Patient and CSW reviewed pt's identified goals and treatment plan. Patient verbalized understanding and agreed to treatment plan. CSW reviewed [facility] "Discharge Process and Patient Involvement" Form. Pt verbalized understanding of information provided and signed form.   Reason for Continuation of Hospitalization:  Anxiety Depression Medication stabilization Suicidal ideation  Estimated length of stay: 3-5 days  Review of initial/current patient goals per problem list:   1.  Goal(s): Patient will participate in aftercare plan  Met:  Yes  Target date: 3-5 days from date of admission   As evidenced by: Patient will participate within aftercare plan AEB aftercare provider and housing plan at discharge being identified.   12/06/15:  Pt will return home and follow-up with outpatient services  2.  Goal (s): Patient will exhibit decreased depressive symptoms and suicidal ideations.  Met:  No  Target date: 3-5 days from date of admission   As evidenced by: Patient will utilize self rating of depression at 3 or below and demonstrate decreased signs of depression or be deemed stable for discharge by MD.  12/06/15: Pt continues to rate  depression at high levels; denies SI  3.  Goal(s): Patient will demonstrate decreased signs and symptoms of anxiety.  Met:  No  Target date: 3-5 days from date of admission   As evidenced by: Patient will utilize self rating of anxiety at 3 or below and demonstrated decreased signs of anxiety, or be deemed stable for discharge by MD  12/06/15: Pt rates anxiety at "25"/10  Attendees:  Patient:    Family:    Physician: Dr. Parke Poisson, MD  12/06/2015 4:36 PM  Nursing: Lars Pinks, RN Case manager  12/06/2015 4:36 PM  Clinical Social Worker Peri Maris, [place] 12/06/2015 4:36 PM  Other: Tilden Fossa, [place] 12/06/2015 4:36 PM  Clinical:  Sandre Kitty, RN 12/06/2015 4:36 PM  Other: , RN Charge Nurse 12/06/2015 4:36 PM  Other: Hilda Lias, [place], [place] Work [phone]

## 2015-12-07 DIAGNOSIS — F419 Anxiety disorder, unspecified: Secondary | ICD-10-CM

## 2015-12-07 DIAGNOSIS — F332 Major depressive disorder, recurrent severe without psychotic features: Secondary | ICD-10-CM

## 2015-12-07 NOTE — Progress Notes (Signed)
Patient up and visible in the milieu. Labile, irritable. Rates depression and hopelessness both at a 5/10, anxiety at a 10/10. States he signed a 72 hour RFD on admit however no record in EPIC or paper chart. Admission nurse Lissa Merlin) present and states she did not provide him with this form nor did he request it from her. Complains that the other patients are negative in their comments, particularly during group. States he is angry that his Lorrin Mais was cut back from 15mg  to 10mg . Angry that his perception is the doctor told him he would be discharged tomorrow however per his conversation with NP, he is not slated for discharge. 72 hour RFD provided and patient signed it at 1341. Medicated per orders and emotional support offered. Self inventory reviewed. He remains safe on level III obs and denies SI/HI. States he is just here because his wife wanted him to come in and he is not a danger to himself or others. "I just have OCD."

## 2015-12-07 NOTE — B[facility] Group Notes (Signed)
[place] LCSW Group Therapy  12/07/2015 10:15 to 11 AM  Type of Therapy:  Group Therapy  Participation Level:  Active  Participation Quality:  Attentive  Affect:  Appropriate and humorus  Cognitive:  Alert and Oriented  Insight:  Limited  Engagement in Therapy:  Limited  Modes of Intervention:  Clarification, Discussion, Exploration, Socialization and Support  Summary of Progress/Problems:  The main focus of today's process group was for the patient to identify ways in which they have in the past sabotaged their own recovery. Motivational Interviewing was utilized to identify motivation they may have for wanting to change. The Stages of Change were explained using a handout, and patients identified where they currently are with regard to stages of change. Pt reports willingness (action) to try new medications.    Sheilah Pigeon, LCSW

## 2015-12-07 NOTE — Plan of Care (Signed)
Problem: Ineffective individual coping Goal: STG: Patient will remain free from self harm Outcome: Progressing Patient has not engaged in self harm, denies Si.  Problem: Diagnosis: Increased Risk For Suicide Attempt Goal: STG-Patient Will Comply With Medication Regime Outcome: Progressing Patient is med compliant.

## 2015-12-07 NOTE — Progress Notes (Signed)
Adult Psychoeducational Group Note  Date:  12/07/2015 Time:  8:57 PM  Group Topic/Focus:  Wrap-Up Group:   The focus of this group is to help patients review their daily goal of treatment and discuss progress on daily workbooks.  Participation Level:  Active  Participation Quality:  Appropriate and Attentive  Affect:  Appropriate  Cognitive:  Appropriate  Insight: Appropriate  Engagement in Group:  Engaged  Modes of Intervention:  Discussion  Additional Comments:  Pt stated his goal for today was to think positive in general. Pt stated a coping skill he used is laughing.   Clint Bolder 12/07/2015, 8:57 PM

## 2015-12-07 NOTE — Progress Notes (Signed)
Joshua Eaton states today was "enjoyable". He rates Anxiety 1/10 and Depression 5/10. He is ready to be discharged home. His support system includes his wife and 4 sisters. He endorses passive SI but contracts for safety. Denies HI/AVH at this time. Encouragement and support given. Medications administered as prescribed. Continue to monitor Q 15 minutes for patient safety and medication effectiveness.

## 2015-12-07 NOTE — Progress Notes (Signed)
[facility] MD Progress Note  12/07/2015 2:15 PM Onel Kong  MRN:  [id number]   Subjective:  Patient reports " I only get anxious when I wake up in the morning and think about my eye disease."   Objective:  Regenald Maturo is awake, alert and oriented X4, found sitting in the dayroom interacting with peers.  Denies suicidal or homicidal ideation. Denies auditory or visual hallucination and does not appear to be responding to internal stimuli. Patient reports he is medication compliant without mediation side effects. However patient reports that he doesn't care for group sessions, due to the negativity. States he is ready to be discharged on Sunday like he was promised. Patient reports signing a 72 hour requested to be discharged. Unable to locate paperwork)  States his depression 5/10. Patient reports he feels ready to be discharged.  Reports good appetite and states he is resting well. Support, encouragement and reassurance was provided.   Principal Problem: Anxiety and depression Diagnosis:   Patient Active Problem List   Diagnosis Date Noted  . Anxiety and depression [F41.8] 12/05/2015   Total Time spent with patient: 30 minutes  Past Psychiatric History: See Above Past Medical History:  Past Medical History  Diagnosis Date  . Anxiety   . Depression   . OCD (obsessive compulsive disorder)   . Hypertension   . Cancer [facility])     Past Surgical History  Procedure Laterality Date  . Colon surgery      cancerous polyp removed  . Cholecystectomy    . Hernia repair     Family History: History reviewed. No pertinent family history. Family Psychiatric  History: See H&P Social History:  History  Alcohol Use No     History  Drug Use No    Social History   Social History  . Marital Status: Married    Spouse Name: N/A  . Number of Children: N/A  . Years of Education: N/A   Social History Main Topics  . Smoking status: Never Smoker   . Smokeless tobacco: None  . Alcohol Use: No  . Drug  Use: No  . Sexual Activity: Not Asked   Other Topics Concern  . None   Social History Narrative   Additional Social History:    Pain Medications: pt denies abuse = see PTA meds list Prescriptions: pt denies abuse = see PTA meds list Over the Counter: pt denies abuse -see PTA meds list History of alcohol / drug use?: No history of alcohol / drug abuse Longest period of sobriety (when/how long): n/a                    Sleep: Fair  Appetite:  Fair  Current Medications: Current Facility-Administered Medications  Medication Dose Route Frequency Provider Last Rate Last Dose  . acetaminophen (TYLENOL) tablet 650 mg  650 mg Oral Q6H PRN [name], NP      . alum & mag hydroxide-simeth (MAALOX/MYLANTA) 200-200-20 MG/5ML suspension 30 mL  30 mL Oral Q4H PRN [name], NP      . clonazePAM [facility]) tablet 1 mg  1 mg Oral [id number] [name], MD   1 mg at 12/07/15 0853  . hydrOXYzine (ATARAX/VISTARIL) tablet 50 mg  50 mg Oral TID PRN [name], NP   50 mg at 12/06/15 2241  . ICAPS AREDS 2 CAPS 2 capsule  2 capsule Oral BID [name], MD   1 capsule at 12/07/15 0854  . magnesium hydroxide (MILK OF MAGNESIA) suspension  30 mL  30 mL Oral Daily PRN [name], NP      . PARoxetine (PAXIL) tablet 20 mg  20 mg Oral Daily [name], MD   20 mg at 12/07/15 0853  . zolpidem (AMBIEN) tablet 10 mg  10 mg Oral QHS PRN Nicholaus Bloom, MD   10 mg at 12/06/15 2242    Lab Results: No results found for this or any previous visit (from the past 28 hour(s)).  Blood Alcohol level:  No results found for: [facility]  Physical Findings: AIMS: Facial and Oral Movements Muscles of Facial Expression: None, normal Lips and Perioral Area: None, normal Jaw: None, normal Tongue: None, normal,Extremity Movements Upper (arms, wrists, hands, fingers): None, normal Lower (legs, knees, ankles, toes): None, normal, Trunk Movements Neck, shoulders, hips: None, normal, Overall  Severity Severity of abnormal movements (highest score from questions above): None, normal Incapacitation due to abnormal movements: None, normal Patient's awareness of abnormal movements (rate only patient's report): No Awareness, Dental Status Current problems with teeth and/or dentures?: No Does patient usually wear dentures?: No  CIWA:    COWS:     Musculoskeletal: Strength & Muscle Tone: within normal limits Gait & Station: normal Patient leans: N/A  Psychiatric Specialty Exam: Review of Systems  Psychiatric/Behavioral: Positive for depression. Negative for suicidal ideas and hallucinations. The patient is nervous/anxious.   All other systems reviewed and are negative.   Blood pressure 110/72, pulse 74, temperature 98.4 F (36.9 C), temperature source Oral, resp. rate 18, height 5' 9.75" (1.772 m), weight 89.359 kg (197 lb).Body mass index is 28.46 kg/(m^2).  General Appearance: Casual  Eye Contact::  Good  Speech:  Clear and Coherent  Volume:  Normal  Mood:  Anxious and Depressed  Affect:  Congruent  Thought Process:  Linear  Orientation:  Full (Time, Place, and Person)  Thought Content:  Hallucinations: None  Suicidal Thoughts:  No  Homicidal Thoughts:  No  Memory:  Immediate;   Fair Recent;   Fair Remote;   Fair  Judgement:  Intact  Insight:  Fair  Psychomotor Activity:  Normal  Concentration:  Fair  Recall:  [employer] of Knowledge:Fair  Language: Fair  Akathisia:  No  Handed:  Right  AIMS (if indicated):     Assets:  Resilience  ADL's:  Intact  Cognition: WNL  Sleep:  Number of Hours: 6.25     I agree with current treatment plan on 12/07/2015, Patient seen face-to-face for psychiatric evaluation follow-up, chart reviewed. Reviewed the information documented and agree with the treatment plan.  Treatment Plan Summary:  Daily contact with patient to assess and evaluate symptoms and progress in treatment and Medication management   Continue klonopin 1  mgrs TID for anxiety. Would continue this treatment at this time.  Continue start Paxil 20 mg PO Q Am for depression/anxiety  Continue Ambien 10 mg PO QHS for insomnia .  MD noted-Will taper Ambien from 15 mgrs QHS to 5 mgrs QHS PRN to minimize risk of excessive sedation and interactions with BZD  Will continue to monitor vitals ,medication compliance and treatment side effects while patient is here.  Reviewed labs: Pending Admission Labs CSW will start working on disposition.  Patient to participate in therapeutic milieu  Derrill Center, NP 12/07/2015, 2:15 PM I agree with assessment and plan Geralyn Flash A. Sabra Heck, M.D.

## 2015-12-08 MED ORDER — PAROXETINE HCL 10 MG PO TABS
10.0000 mg | ORAL_TABLET | Freq: Every day | ORAL | Status: DC
  Administered 2015-12-09: 10 mg via ORAL
  Filled 2015-12-08 (×3): qty 1

## 2015-12-08 MED ORDER — ZOLPIDEM TARTRATE 5 MG PO TABS
5.0000 mg | ORAL_TABLET | Freq: Every evening | ORAL | Status: DC | PRN
  Administered 2015-12-08 – 2015-12-09 (×2): 5 mg via ORAL
  Filled 2015-12-08 (×2): qty 1

## 2015-12-08 NOTE — [facility] Group Notes (Signed)
[facility] Group Notes:  (Nursing/MHT/Case Management/Adjunct)  Date:  12/08/2015  Time:  1315  Type of Therapy:  Nurse Education  /   Healthy Support Systems :  The group focuses on teaching patients how to develop and utilize  healthy support systems.  Participation Level:  Active  Participation Quality:  Appropriate  Affect:  Appropriate  Cognitive:  Appropriate  Insight:  Appropriate  Engagement in Group:  Engaged  Modes of Intervention:  Education  Summary of Progress/Problems:  Joshua Eaton 12/08/2015, 3:12 PM

## 2015-12-08 NOTE — Progress Notes (Signed)
Patient up and visible in the milieu. Affect anxious, mood congruent. Rates depression and hopelessness both at a 5/10, anxiety at a 10/10. States his goal is to "feel better" and to "try harder." Denies pain however reports feeling "shaky" from what he thinks is the initiation of paxil. Encouraged to speak with MD. Emotional support offered. Self inventory reviewed. Fall precautions in place and teaching reviewed. Patient verbalizes understanding. He denies SI/HI again stating, "ma'am, I'm not here for that. I have OCD." Patient remains safe on level III obs.

## 2015-12-08 NOTE — Progress Notes (Signed)
Adult Psychoeducational Group Note  Date:  12/08/2015 Time:  9:05 PM  Group Topic/Focus:  Wrap-Up Group:   The focus of this group is to help patients review their daily goal of treatment and discuss progress on daily workbooks.  Participation Level:  Active  Participation Quality:  Appropriate and Attentive  Affect:  Appropriate  Cognitive:  Appropriate  Insight: Appropriate  Engagement in Group:  Engaged  Modes of Intervention:  Discussion  Additional Comments:  Pt stated he had a good day today. Pt stated he enjoyed going outside and eating good food.  Clint Bolder 12/08/2015, 9:05 PM

## 2015-12-08 NOTE — Plan of Care (Signed)
Problem: Diagnosis: Increased Risk For Suicide Attempt Goal: STG-Patient Will Attend All Groups On The Unit Outcome: Progressing Patient attending groups/programming.  Problem: Alteration in mood Goal: STG-Patient reports thoughts of self-harm to staff Outcome: Progressing Patient denies thoughts to self harm. No SI.

## 2015-12-08 NOTE — Progress Notes (Signed)
[facility] MD Progress Note  12/08/2015 9:52 AM Joshua Eaton  MRN:  [id number]   Subjective:  Patient reports " I feel shaky and nervous today I think its from the Paxil ."   Objective:  Joshua Eaton is awake, alert and oriented X4, found sitting in the dayroom interacting with peers.  Denies suicidal or homicidal ideation. Denies auditory or visual hallucination and does not appear to be responding to internal stimuli. Patient reports he is medication compliant. Patient reports similar side effect when taken Zoloft in the past. However patient reports that he doesn't care for group sessions, due to the negativity. Patient reports I always real nervous because I have high anxiety anyway's. States he is ready to be discharged on Sunday like he was promised. Patient reports signing a 72 hour requested to be discharged. (Unable to locate paperwork)  States his depression 5/10. Patient reports he feels ready to be discharged. Patient reports he was advised by his sister to just relax and go with the flow. Reports good appetite and states he didn't rest well last night. Support, encouragement and reassurance was provided.   Principal Problem: Anxiety and depression Diagnosis:   Patient Active Problem List   Diagnosis Date Noted  . Anxiety and depression [F41.8] 12/05/2015   Total Time spent with patient: 30 minutes  Past Psychiatric History: See Above Past Medical History:  Past Medical History  Diagnosis Date  . Anxiety   . Depression   . OCD (obsessive compulsive disorder)   . Hypertension   . Cancer [facility])     Past Surgical History  Procedure Laterality Date  . Colon surgery      cancerous polyp removed  . Cholecystectomy    . Hernia repair     Family History: History reviewed. No pertinent family history. Family Psychiatric  History: See H&P Social History:  History  Alcohol Use No     History  Drug Use No    Social History   Social History  . Marital Status: Married    Spouse Name:  N/A  . Number of Children: N/A  . Years of Education: N/A   Social History Main Topics  . Smoking status: Never Smoker   . Smokeless tobacco: None  . Alcohol Use: No  . Drug Use: No  . Sexual Activity: Not Asked   Other Topics Concern  . None   Social History Narrative   Additional Social History:    Pain Medications: pt denies abuse = see PTA meds list Prescriptions: pt denies abuse = see PTA meds list Over the Counter: pt denies abuse -see PTA meds list History of alcohol / drug use?: No history of alcohol / drug abuse Longest period of sobriety (when/how long): n/a                    Sleep: Fair  Appetite:  Fair  Current Medications: Current Facility-Administered Medications  Medication Dose Route Frequency Provider Last Rate Last Dose  . acetaminophen (TYLENOL) tablet 650 mg  650 mg Oral Q6H PRN [name], NP      . alum & mag hydroxide-simeth (MAALOX/MYLANTA) 200-200-20 MG/5ML suspension 30 mL  30 mL Oral Q4H PRN [name], NP      . clonazePAM [facility]) tablet 1 mg  1 mg Oral [id number] [name], MD   1 mg at 12/08/15 0807  . hydrOXYzine (ATARAX/VISTARIL) tablet 50 mg  50 mg Oral TID PRN [name], NP   50 mg at 12/07/15 2244  .  ICAPS AREDS 2 CAPS 2 capsule  2 capsule Oral BID [name], MD   1 capsule at 12/08/15 0807  . magnesium hydroxide (MILK OF MAGNESIA) suspension 30 mL  30 mL Oral Daily PRN [name], NP      . PARoxetine (PAXIL) tablet 20 mg  20 mg Oral Daily [name], MD   20 mg at 12/08/15 [id number]  . zolpidem (AMBIEN) tablet 10 mg  10 mg Oral QHS PRN Nicholaus Bloom, MD   10 mg at 12/07/15 2244    Lab Results: No results found for this or any previous visit (from the past 85 hour(s)).  Blood Alcohol level:  No results found for: [facility]  Physical Findings: AIMS: Facial and Oral Movements Muscles of Facial Expression: None, normal Lips and Perioral Area: None, normal Jaw: None, normal Tongue: None,  normal,Extremity Movements Upper (arms, wrists, hands, fingers): None, normal Lower (legs, knees, ankles, toes): None, normal, Trunk Movements Neck, shoulders, hips: None, normal, Overall Severity Severity of abnormal movements (highest score from questions above): None, normal Incapacitation due to abnormal movements: None, normal Patient's awareness of abnormal movements (rate only patient's report): No Awareness, Dental Status Current problems with teeth and/or dentures?: No Does patient usually wear dentures?: No  CIWA:    COWS:     Musculoskeletal: Strength & Muscle Tone: within normal limits Gait & Station: normal Patient leans: N/A  Psychiatric Specialty Exam: Review of Systems  Psychiatric/Behavioral: Positive for depression. Negative for suicidal ideas and hallucinations. The patient is nervous/anxious.   All other systems reviewed and are negative.   Blood pressure 131/63, pulse 83, temperature 98.6 F (37 C), temperature source Oral, resp. rate 18, height 5' 9.75" (1.772 m), weight 89.359 kg (197 lb).Body mass index is 28.46 kg/(m^2).  General Appearance: Casual  Eye Contact::  Good  Speech:  Clear and Coherent  Volume:  Normal  Mood:  Anxious and Depressed  Affect:  Congruent  Thought Process:  Linear  Orientation:  Full (Time, Place, and Person)  Thought Content:  Hallucinations: None  Suicidal Thoughts:  No  Homicidal Thoughts:  No  Memory:  Immediate;   Fair Recent;   Fair Remote;   Fair  Judgement:  Intact  Insight:  Fair  Psychomotor Activity:  Normal  Concentration:  Fair  Recall:  [employer] of Knowledge:Fair  Language: Fair  Akathisia:  No  Handed:  Right  AIMS (if indicated):     Assets:  Resilience  ADL's:  Intact  Cognition: WNL  Sleep:  Number of Hours: 5     I agree with current treatment plan on 12/07/2015, Patient seen face-to-face for psychiatric evaluation follow-up, chart reviewed. Reviewed the information documented and agree with  the treatment plan.  Treatment Plan Summary:  Daily contact with patient to assess and evaluate symptoms and progress in treatment and Medication management   Continue klonopin 1 mgrs TID for anxiety. Would continue this treatment at this time.  Decreased Paxil 20 mg to 10 mg PO Q Am for depression/anxiety -consider titration  Continue Ambien 10 mg PO QHS for insomnia .  MD noted-Will taper Ambien from 15 mgrs QHS to 5 mgrs QHS PRN to minimize risk of excessive sedation and interactions with BZD  Taper Ambien 5 mg PO QHS for insomnia Will continue to monitor vitals ,medication compliance and treatment side effects while patient is here.  Reviewed labs: Pending Admission Labs CSW will start working on disposition.  Patient to participate in therapeutic milieu  Derrill Center, NP 12/08/2015, 9:52 AM I agree with assessment and plan Woodroe Chen. Sabra Heck, M.D.

## 2015-12-09 LAB — BASIC METABOLIC PANEL
Anion gap: 9 (ref 5–15)
BUN: 16 mg/dL (ref 6–20)
CALCIUM: 9.1 mg/dL (ref 8.9–10.3)
CO2: 26 mmol/L (ref 22–32)
CREATININE: 0.86 mg/dL (ref 0.61–1.24)
Chloride: 106 mmol/L (ref 101–111)
GFR calc non Af Amer: >60 mL/min (ref >60)
Glucose, Bld: 168 mg/dL — ABNORMAL HIGH (ref 65–99)
Potassium: 3.7 mmol/L (ref 3.5–5.1)
SODIUM: 141 mmol/L (ref 135–145)

## 2015-12-09 LAB — TSH: TSH: 0.725 u[IU]/mL (ref 0.350–4.500)

## 2015-12-09 LAB — CBC
HCT: 44.9 % (ref 39.0–52.0)
Hemoglobin: 16.2 g/dL (ref 13.0–17.0)
MCH: 32.1 pg (ref 26.0–34.0)
MCHC: 36.1 g/dL — ABNORMAL HIGH (ref 30.0–36.0)
MCV: 89.1 fL (ref 78.0–100.0)
PLATELETS: 138 10*3/uL — AB (ref 150–400)
RBC: 5.04 MIL/uL (ref 4.22–5.81)
RDW: 13.3 % (ref 11.5–15.5)
WBC: 10.4 10*3/uL (ref 4.0–10.5)

## 2015-12-09 LAB — GLUCOSE, CAPILLARY: Glucose-Capillary: 88 mg/dL (ref 65–99)

## 2015-12-09 LAB — T4, FREE: FREE T4: 0.94 ng/dL (ref 0.61–1.12)

## 2015-12-09 LAB — MAGNESIUM: Magnesium: 2.1 mg/dL (ref 1.7–2.4)

## 2015-12-09 MED ORDER — VENLAFAXINE HCL ER 37.5 MG PO CP24: 37.5000 mg | ORAL_CAPSULE | Freq: Every day | ORAL | Status: DC

## 2015-12-09 NOTE — Progress Notes (Addendum)
Patient ID: Joshua Eaton, male   DOB: December 19, 1955, 60 y.o.   MRN: [id number] [facility] MD Progress Note  12/09/2015 12:56 PM Joshua Eaton  MRN:  [id number]   Subjective: patient states he is experiencing increased anxiety today, and states " I feel terrible ". Describes a vague sense of dysesthesias , tingling in his extremities, severe anxiety, and a  Feeling of imminent panic . Denies chest pain, no shortness of breath at room air . Attributes the above to Paxil- states he feels these symptoms most likely Paxil related side effects, states " I don't want to continue to take it , I think it is making me worse ".  Objective:  Patient seen and case reviewed with treatment team. As above, reports worsening symptoms of anxiety today. He reports he feels these are related to poor tolerance to Paxil, as he states he has been feeling them since Paxil was started .  He does not want to continue taking Paxil, and is currently not wanting to start another antidepressant medication . States " I have been on several and they do not work well for me. Right now I just think I need to come off Paxil". Tolerating Klonopin well , denies side effects or excessive sedation   As per staff, has been going to groups, visible in milieu, reporting ongoing , severe anxiety.  Denies any suicidal ideations . Vitals stable - 146/64, pulse 97, FS 88 , EKG - Sinus rhythm  PVCs.  Principal Problem: Anxiety and depression Diagnosis:   Patient Active Problem List   Diagnosis Date Noted  . Anxiety and depression [F41.8] 12/05/2015   Total Time spent with patient: 25 minutes   Past Psychiatric History: See Above Past Medical History:  Past Medical History  Diagnosis Date  . Anxiety   . Depression   . OCD (obsessive compulsive disorder)   . Hypertension   . Cancer [facility])     Past Surgical History  Procedure Laterality Date  . Colon surgery      cancerous polyp removed  . Cholecystectomy    . Hernia repair     Family History:  History reviewed. No pertinent family history. Family Psychiatric  History: See H&P Social History:  History  Alcohol Use No     History  Drug Use No    Social History   Social History  . Marital Status: Married    Spouse Name: N/A  . Number of Children: N/A  . Years of Education: N/A   Social History Main Topics  . Smoking status: Never Smoker   . Smokeless tobacco: None  . Alcohol Use: No  . Drug Use: No  . Sexual Activity: Not Asked   Other Topics Concern  . None   Social History Narrative   Additional Social History:    Pain Medications: pt denies abuse = see PTA meds list Prescriptions: pt denies abuse = see PTA meds list Over the Counter: pt denies abuse -see PTA meds list History of alcohol / drug use?: No history of alcohol / drug abuse Longest period of sobriety (when/how long): n/a  Sleep: Fair  Appetite:  Fair  Current Medications: Current Facility-Administered Medications  Medication Dose Route Frequency Provider Last Rate Last Dose  . acetaminophen (TYLENOL) tablet 650 mg  650 mg Oral Q6H PRN [name], NP      . alum & mag hydroxide-simeth (MAALOX/MYLANTA) 200-200-20 MG/5ML suspension 30 mL  30 mL Oral Q4H PRN [name], NP      .  clonazePAM (KLONOPIN) tablet 1 mg  1 mg Oral [id number] Saramma Eappen, MD   1 mg at 12/09/15 0810  . hydrOXYzine (ATARAX/VISTARIL) tablet 50 mg  50 mg Oral TID PRN [name], NP   50 mg at 12/09/15 1223  . ICAPS AREDS 2 CAPS 2 capsule  2 capsule Oral BID [name], MD   2 capsule at 12/09/15 [id number]  . magnesium hydroxide (MILK OF MAGNESIA) suspension 30 mL  30 mL Oral Daily PRN [name], NP      . PARoxetine (PAXIL) tablet 10 mg  10 mg Oral Daily Derrill Center, NP   10 mg at 12/09/15 0809  . zolpidem (AMBIEN) tablet 5 mg  5 mg Oral QHS PRN Derrill Center, NP   5 mg at 12/08/15 2232    Lab Results:  Results for orders placed or performed during the hospital encounter of 12/05/15 (from the past  48 hour(s))  Glucose, capillary     Status: None   Collection Time: 12/09/15 12:20 PM  Result Value Ref Range   Glucose-Capillary 88 65 - 99 mg/dL    Blood Alcohol level:  No results found for: [facility]  Physical Findings: AIMS: Facial and Oral Movements Muscles of Facial Expression: None, normal Lips and Perioral Area: None, normal Jaw: None, normal Tongue: None, normal,Extremity Movements Upper (arms, wrists, hands, fingers): None, normal Lower (legs, knees, ankles, toes): None, normal, Trunk Movements Neck, shoulders, hips: None, normal, Overall Severity Severity of abnormal movements (highest score from questions above): None, normal Incapacitation due to abnormal movements: None, normal Patient's awareness of abnormal movements (rate only patient's report): No Awareness, Dental Status Current problems with teeth and/or dentures?: No Does patient usually wear dentures?: No  CIWA:    COWS:     Musculoskeletal: Strength & Muscle Tone: within normal limits Gait & Station: normal Patient leans: N/A  Psychiatric Specialty Exam: Review of Systems  Psychiatric/Behavioral: Positive for depression. Negative for suicidal ideas and hallucinations. The patient is nervous/anxious.   All other systems reviewed and are negative.   Blood pressure 152/85, pulse 81, temperature 98.1 F (36.7 C), temperature source Oral, resp. rate 18, height 5' 9.75" (1.772 m), weight 197 lb (89.359 kg).Body mass index is 28.46 kg/(m^2).  General Appearance: Fairly Groomed  Engineer, water::  Good  Speech:  Normal Rate  Volume:  Normal  Mood:   Anxious , not as depressed   Affect:  Anxious, vaguely irritable   Thought Process:  Linear  Orientation:  Full (Time, Place, and Person)- recall 3/3 immediate, 2/3 at 5 minutes   Thought Content:   No hallucinations, no delusions  Suicidal Thoughts:  No does not endorse suicidal plan or intention, contracts for safety on unit   Homicidal Thoughts:  No  Memory:  recent and remote grossly intact   Judgement:  Fair  Insight:  Fair  Psychomotor Activity:  Normal  Concentration:  Good  Recall:  Good  Fund of Knowledge:Good  Language: Good  Akathisia:  No  Handed:  Right  AIMS (if indicated):     Assets:  Resilience  ADL's:  Intact  Cognition: WNL  Sleep:  Number of Hours: 6.5  Assessment - patient reports increasing anxiety today and describes diffuse sense of tingling, subjective sense of agitation, and sense of impending doom, suggestive of panic symptomatology.  Attributes this to Paxil, as he states he is clearly feeling more of these symptoms since he started this antidepressant. States that the only medication that has been  helpful thus far is Klonopin. We have discussed psychopharmacological options- at this time not interested in restarting another antidepressant .     Treatment Plan Summary:  Daily contact with patient to assess and evaluate symptoms and progress in treatment and Medication management   Continue to encourage groups/milieu participation to work on coping skills and symptom reduction  Continue Klonopin 1 mgr TID for severe anxiety .   D/C Paxil- see above  Continue  Vistaril 25 mgrs Q  6 hours PRN for anxiety as needed  Continue Ambien 5 mgrs QHS for insomnia . Check BMP, Mg ++,  and CBC in AM  COBOS, FERNANDO, MD 12/09/2015, 12:56 PM Addendum - reviewed EKG result with Hospitalist . Has PVCs, which he did not have on prior EKG- recommended to check BMP, Mg++, and repeat TSH, FT4.

## 2015-12-09 NOTE — BHH Group Notes (Signed)
[facility] LCSW Group Therapy  12/09/2015 1:15pm  Type of Therapy:  Group Therapy vercoming Obstacles  Pt did not attend, declined invitation.    Peri Maris, LCSWA 12/09/2015 3:00 PM

## 2015-12-09 NOTE — Progress Notes (Signed)
DAR: Pt present with very anxious mood and flat affect. Pt has been isolating a lot, complained of dayroom being too noisy for him to stay. Pt has been encouraged to approach staff with his needs and also advised to not to stand up abruptly when getting up to avoid falls. Pt took all his medications as scheduled, denied physical pain.  As per self inventory, pt had a good night sleep, good appetite, low energy, and good concentration. Pt rate depression at 5, hopeless ness at 5, and anxiety at a 10. Pt's safety ensured with 15 minute and environmental checks. Pt currently denies SI/HI and A/V hallucinations. Pt verbally agrees to seek staff if SI/HI or A/VH occurs and to consult with staff before acting on these thoughts. Will continue POC.

## 2015-12-09 NOTE — Progress Notes (Signed)
Recreation Therapy Notes  Date: 04.10.2017 Time: 9:30am Location: [address] Group Room   Group Topic: Stress Management  Goal Area(s) Addresses:  Patient will actively participate in stress management techniques presented during session.   Behavioral Response: Appropriate, Engaged  Intervention: Stress management techniques  Activity :  Diaphragmatic Breathing, Mindful Breathing and Mindfulness. LRT provided education, instruction and demonstration on practice of Diaphragmatic Breathing, Mindful Breathing and Mindfulness. Patient was asked to participate in technique introduced during session.   Education:  Stress Management, Discharge Planning.   Education Outcome: Acknowledges education  Clinical Observations/Feedback: Patient actively engaged in technique introduced, expressed no concerns and demonstrated ability to practice independently post d/c.   Laureen Ochs Jenisse Vullo, LRT/CTRS        Chasady Longwell L 12/09/2015 1:44 PM

## 2015-12-09 NOTE — BHH Group Notes (Signed)
[facility] LCSW Aftercare Discharge Planning Group Note  12/09/2015 8:45 AM  Participation Quality: Alert, Appropriate and Oriented  Mood/Affect: Anxious  Depression Rating: 5  Anxiety Rating: 10  Thoughts of Suicide: Pt denies SI/HI  Will you contract for safety? Yes  Current AVH: Pt denies  Plan for Discharge/Comments: Pt attended discharge planning group and actively participated in group. CSW discussed suicide prevention education with the group and encouraged them to discuss discharge planning and any relevant barriers. Pt reports that he woke up anxious this morning and is not sure of the reason. Pt expresses no needs at this time.  Transportation Means: Pt reports access to transportation  Supports: No supports mentioned at this time  Peri Maris, [place] 12/09/2015 9:19 AM

## 2015-12-09 NOTE — Progress Notes (Signed)
D: When asked about questions or concerns pt stated "I feel all right. Dr Salvatore Decent could go home today, but he wasn't here". Writer also spoke with the pt his grief.  Pt has no questions or concerns.   A:  Support and encouragement was offered. 15 min checks continued for safety.  R: Pt remains safe.

## 2015-12-09 NOTE — BHH Suicide Risk Assessment (Signed)
[place] INPATIENT:  Family/Significant Other Suicide Prevention Education  Suicide Prevention Education:  Education Completed; Joshua Eaton, Pt's wife [phone], has been identified by the patient as the family member/significant other with whom the patient will be residing, and identified as the person(s) who will aid the patient in the event of a mental health crisis (suicidal ideations/suicide attempt).  With written consent from the patient, the family member/significant other has been provided the following suicide prevention education, prior to the and/or following the discharge of the patient.  The suicide prevention education provided includes the following:  Suicide risk factors  Suicide prevention and interventions  National Suicide Hotline telephone number  [facility] assessment telephone number  [facility] Emergency Assistance [place] and/or Residential Mobile Crisis Unit telephone number  Request made of family/significant other to:  Remove weapons (e.g., guns, rifles, knives), all items previously/currently identified as safety concern.    Remove drugs/medications (over-the-counter, prescriptions, illicit drugs), all items previously/currently identified as a safety concern.  The family member/significant other verbalizes understanding of the suicide prevention education information provided.  The family member/significant other agrees to remove the items of safety concern listed above.  Joshua Eaton 12/09/2015, 3:01 PM

## 2015-12-10 MED ORDER — MIRTAZAPINE 15 MG PO TABS
7.5000 mg | ORAL_TABLET | Freq: Every evening | ORAL | Status: DC | PRN
  Administered 2015-12-10: 7.5 mg via ORAL
  Filled 2015-12-10: qty 1
  Filled 2015-12-10: qty 0.5
  Filled 2015-12-10: qty 1
  Filled 2015-12-10 (×3): qty 0.5

## 2015-12-10 MED ORDER — ICAPS AREDS 2 PO CAPS
1.0000 | ORAL_CAPSULE | Freq: Two times a day (BID) | ORAL | Status: DC
  Administered 2015-12-10 – 2015-12-11 (×2): 1 via ORAL

## 2015-12-10 MED ORDER — GABAPENTIN 100 MG PO CAPS
100.0000 mg | ORAL_CAPSULE | Freq: Three times a day (TID) | ORAL | Status: DC
  Administered 2015-12-10 – 2015-12-11 (×4): 100 mg via ORAL
  Filled 2015-12-10 (×10): qty 1

## 2015-12-10 MED ORDER — MIRTAZAPINE 7.5 MG PO TABS
7.5000 mg | ORAL_TABLET | Freq: Every day | ORAL | Status: DC
  Administered 2015-12-10: 7.5 mg via ORAL
  Filled 2015-12-10 (×2): qty 1

## 2015-12-10 NOTE — BHH Group Notes (Signed)
The focus of this group is to educate the patient on the purpose and policies of crisis stabilization and provide a format to answer questions about their admission.  The group details unit policies and expectations of patients while admitted.  Patient did not attend 0900 nurse education orientation group this morning.  Patient stayed in bed.   

## 2015-12-10 NOTE — Progress Notes (Signed)
DAR: Pt present with anxious and depressed mood. Pt stated  He afraid something bad is going to happen to him, pt will not come out of the room and complained of not getting better. Pt appears confused and believed that medication he is taking is not going to help him. Pt stated he did not sleep well last night, has poor appetite, low energy and poor concentration. Pt rate depression at 10, hopelessness at 3, and anxiety at 10. Pt's goal for today is to " get out of bed, but feels like i can't."Pt's safety ensured with 15 minute and environmental checks. Pt currently denies SI/HI and A/V hallucinations. Pt verbally agrees to seek staff if SI/HI or A/VH occurs and to consult with staff before acting on these thoughts. Will continue POC.

## 2015-12-10 NOTE — Progress Notes (Signed)
D: Pt's wife was with him when the writer entered the room. When asked about his day pt stated, "I was numb and tingly all over". Stated, "I was able to calm down with my wife here".  Pt then stated, "I can't walk. I might not be able to go home tomorrow. They may have to drag me out of here". Informed the writer that he " can't deal with the people in the dayroom.  Informed Probation officer later that [employer] works well. Writer discussed further need for possible anti depressant as well as an anxiolytic. Pt has no other questions or concerns. Encouraged participation in the milieu.  A:  Support and encouragement was offered. 15 min checks continued for safety.  R: Pt remains safe.

## 2015-12-10 NOTE — Progress Notes (Signed)
Patient ID: Joshua Eaton, male   DOB: [birth year], 60 y.o.   MRN: [id number] [name] MD Progress Note  12/10/2015 2:56 PM Joshua Eaton  MRN:  [id number]   Subjective: Reports increased anxiety. Reports feeling of vague but persistent apprehension, vague sense of impending doom, and a sense of diffuse " tingling " in extremities, and a sense of abdominal discomfort, all related to anxiety. Continues to attribute this to Paxil side effect ( now discontinued ) .  Today, however, also expresses that he feels the milieu, with associated noise, level of stimulation, and peers " being negative " may be contributing to his anxiety. Also, although expresses sense of apprehension that medications may cause side effects or worsen anxiety rather than improve it, is more willing to add another medication to address anxiety. Denies suicidal ideations, and states he does not want to die, reports improved mood and at this time focused  On severe anxiety.  Objective:  Patient seen and case reviewed with treatment team. Patient presents significantly anxious, apprehensive, and ruminative about his symptoms. Denies suicidal ideations, denies hallucinations and does not appear internally preoccupied . With patient's express consent I have spoken with his wife via phone- she reports he has had worsening symptoms of anxiety , depression, and agrees that milieu may be contributing to some increased anxiety.  I have discussed with patient and with wife potential disposition options, such as IOP. Wife is hoping to be able to take some time off work in order to spend more time with patient and insure follow up compliance . Of note, patient is alert, oriented x 3, and there is no current evidence of delirium Labs reviewed :  Mg ++ WNL, TSH, FT4  WNL Principal Problem: Anxiety and depression Diagnosis:   Patient Active Problem List   Diagnosis Date Noted  . Anxiety and depression [F41.8] 12/05/2015   Total Time spent with patient:  25 minutes   Past Psychiatric History: See Above Past Medical History:  Past Medical History  Diagnosis Date  . Anxiety   . Depression   . OCD (obsessive compulsive disorder)   . Hypertension   . Cancer [facility])     Past Surgical History  Procedure Laterality Date  . Colon surgery      cancerous polyp removed  . Cholecystectomy    . Hernia repair     Family History: History reviewed. No pertinent family history. Family Psychiatric  History: See H&P Social History:  History  Alcohol Use No     History  Drug Use No    Social History   Social History  . Marital Status: Married    Spouse Name: N/A  . Number of Children: N/A  . Years of Education: N/A   Social History Main Topics  . Smoking status: Never Smoker   . Smokeless tobacco: None  . Alcohol Use: No  . Drug Use: No  . Sexual Activity: Not Asked   Other Topics Concern  . None   Social History Narrative   Additional Social History:    Pain Medications: pt denies abuse = see PTA meds list Prescriptions: pt denies abuse = see PTA meds list Over the Counter: pt denies abuse -see PTA meds list History of alcohol / drug use?: No history of alcohol / drug abuse Longest period of sobriety (when/how long): n/a  Sleep: improved   Appetite:  Fair  Current Medications: Current Facility-Administered Medications  Medication Dose Route Frequency Provider Last Rate Last Dose  . acetaminophen (TYLENOL)  tablet 650 mg  650 mg Oral Q6H PRN [name], NP      . alum & mag hydroxide-simeth (MAALOX/MYLANTA) 200-200-20 MG/5ML suspension 30 mL  30 mL Oral Q4H PRN [name], NP      . clonazePAM [facility]) tablet 1 mg  1 mg Oral [id number] [name], MD   1 mg at 12/10/15 0827  . gabapentin (NEURONTIN) capsule 100 mg  100 mg Oral TID [name], MD   100 mg at 12/10/15 1146  . ICAPS AREDS 2 CAPS 1 capsule  1 capsule Oral BID [name], MD      . magnesium hydroxide (MILK OF MAGNESIA) suspension 30  mL  30 mL Oral Daily PRN [name], NP      . mirtazapine (REMERON) tablet 7.5 mg  7.5 mg Oral QHS [name], MD        Lab Results:  Results for orders placed or performed during the hospital encounter of 12/05/15 (from the past 48 hour(s))  Glucose, capillary     Status: None   Collection Time: 12/09/15 12:20 PM  Result Value Ref Range   Glucose-Capillary 88 65 - 99 mg/dL  Basic metabolic panel     Status: Abnormal   Collection Time: 12/09/15  6:30 PM  Result Value Ref Range   Sodium 141 135 - 145 mmol/L   Potassium 3.7 3.5 - 5.1 mmol/L   Chloride 106 101 - 111 mmol/L   CO2 26 22 - 32 mmol/L   Glucose, Bld 168 (H) 65 - 99 mg/dL   BUN 16 6 - 20 mg/dL   Creatinine, Ser 0.86 0.61 - 1.24 mg/dL   Calcium 9.1 8.9 - 10.3 mg/dL   GFR calc non Af Amer >60 >60 mL/min   GFR calc Af Amer >60 >60 mL/min    Comment: (NOTE) The eGFR has been calculated using the CKD EPI equation. This calculation has not been validated in all clinical situations. eGFR's persistently <60 mL/min signify possible Chronic Kidney Disease.    Anion gap 9 5 - 15    Comment: Performed at [facility]  CBC     Status: Abnormal   Collection Time: 12/09/15  6:30 PM  Result Value Ref Range   WBC 10.4 4.0 - 10.5 K/uL   RBC 5.04 4.22 - 5.81 MIL/uL   Hemoglobin 16.2 13.0 - 17.0 g/dL   HCT 44.9 39.0 - 52.0 %   MCV 89.1 78.0 - 100.0 fL   MCH 32.1 26.0 - 34.0 pg   MCHC 36.1 (H) 30.0 - 36.0 g/dL   RDW 13.3 11.5 - 15.5 %   Platelets 138 (L) 150 - 400 K/uL    Comment: Performed at [facility]  Magnesium     Status: None   Collection Time: 12/09/15  6:30 PM  Result Value Ref Range   Magnesium 2.1 1.7 - 2.4 mg/dL    Comment: Performed at [facility]  T4, free     Status: None   Collection Time: 12/09/15  6:30 PM  Result Value Ref Range   Free T4 0.94 0.61 - 1.12 ng/dL    Comment: Performed at [facility]  TSH     Status: None   Collection  Time: 12/09/15  6:30 PM  Result Value Ref Range   TSH 0.725 0.350 - 4.500 uIU/mL    Comment: Performed at [facility]    Blood Alcohol level:  No results found for:  [facility]  Physical Findings: AIMS: Facial and Oral Movements Muscles of Facial Expression: None, normal Lips and Perioral Area: None, normal Jaw: None, normal Tongue: None, normal,Extremity Movements Upper (arms, wrists, hands, fingers): None, normal Lower (legs, knees, ankles, toes): None, normal, Trunk Movements Neck, shoulders, hips: None, normal, Overall Severity Severity of abnormal movements (highest score from questions above): None, normal Incapacitation due to abnormal movements: None, normal Patient's awareness of abnormal movements (rate only patient's report): No Awareness, Dental Status Current problems with teeth and/or dentures?: No Does patient usually wear dentures?: No  CIWA:    COWS:     Musculoskeletal: Strength & Muscle Tone: within normal limits Gait & Station: normal Patient leans: N/A  Psychiatric Specialty Exam: Review of Systems  Psychiatric/Behavioral: Positive for depression. Negative for suicidal ideas and hallucinations. The patient is nervous/anxious.   All other systems reviewed and are negative.   Blood pressure 108/77, pulse 103, temperature 98.6 F (37 C), temperature source Oral, resp. rate 12, height 5' 9.75" (1.772 m), weight 197 lb (89.359 kg), SpO2 98 %.Body mass index is 28.46 kg/(m^2).  General Appearance: Fairly Groomed  Engineer, water::  Good  Speech:  Normal Rate  Volume:  Normal  Mood:   Anxious    Affect:  Anxious, fearful, denies depression   Thought Process:  Linear  Orientation:  Full (Time, Place, and Person)-  Thought Content:   No hallucinations, no delusions, ruminative about anxiety, medications, possible side effects  Suicidal Thoughts:  No does not endorse suicidal  Ideations, denies any plan or intention of suicide or of hurting self ,  contracts for safety on unit   Homicidal Thoughts:  No  Memory: recent and remote grossly intact   Judgement:  Fair  Insight:  Fair  Psychomotor Activity:  Normal  Concentration:  Good  Recall:  Good  Fund of Knowledge:Good  Language: Good  Akathisia:  No  Handed:  Right  AIMS (if indicated):     Assets:  Resilience  ADL's:  Intact  Cognition: WNL  Sleep:  Number of Hours: 6.5  Assessment -patient reports subjective sense of severe anxiety, which he tends to attribute to recent medication trial ( Paxil) and today also to milieu. As discussed with wife via phone, anxiety has been worsening over recent months and has become severe at times. Patient does feel that Klonopin helps " a little ", denies side effects. Today, although fearful of side effects, more amenable to discuss adding medications to address symptoms- we discussed options ( to include low dose Seroquel, Zyprexa) , but states he has had side effects to them . Agreeing to Neurontin and Remeron. We have reviewed side effects. Will D/C Ambien, add Remeron     Treatment Plan Summary:  Daily contact with patient to assess and evaluate symptoms and progress in treatment and Medication management   Continue to encourage groups/milieu participation to work on coping skills and symptom reduction  Continue Klonopin 1 mgr TID for severe anxiety .   Start Remeron 7.5 mgrs QHS for insomnia, depression, anxiety disorder  Start Neurontin 100 mgrs TID for anxiety disorder Treatment team working on disposition planning - consider IOP   Neita Garnet, MD 12/10/2015, 2:56 PM

## 2015-12-10 NOTE — BHH Group Notes (Signed)
Adult Psychoeducational Group Note  Date:  12/10/2015 Time:  9:20 PM  Group Topic/Focus:  Wrap-Up Group:   The focus of this group is to help patients review their daily goal of treatment and discuss progress on daily workbooks.  Participation Level:  Minimal  Participation Quality:  Attentive  Affect:  Flat  Cognitive:  Appropriate  Insight: Good  Engagement in Group:  Limited  Modes of Intervention:  Discussion  Additional Comments:  Pt stated his day was a 0 because he has "something going on and I don't know what it is".  Victorino Sparrow A 12/10/2015, 9:20 PM

## 2015-12-10 NOTE — Progress Notes (Signed)
Pt complained of being nervous, shakes, and rasing heart., orthostatic v/s taken.  Pt also rescinded 72 hr form. MD is aware.

## 2015-12-10 NOTE — Progress Notes (Signed)
Recreation Therapy Notes  Animal-Assisted Activity (AAA) Program Checklist/Progress Notes Patient Eligibility Criteria Checklist & Daily Group note for Rec Tx Intervention  Date: 04.12.2017 Time: 2:45pm Location: [address]   AAA/T Program Assumption of Risk Form signed by Patient/ or Parent Legal Guardian Yes  Patient is free of allergies or sever asthma Yes  Patient reports no fear of animals Yes  Patient reports no history of cruelty to animals Yes  Patient understands his/her participation is voluntary Yes  Behavioral Response: Did not attend.   Laureen Ochs Patina Spanier, LRT/CTRS        Kasai Beltran L 12/10/2015 3:05 PM

## 2015-12-10 NOTE — BHH Group Notes (Signed)
[facility] LCSW Group Therapy 12/10/2015 1:15 PM  Type of Therapy: Group Therapy- Feelings about Diagnosis  Pt did not attend, declined invitation.   Peri Maris, LCSWA 12/10/2015 5:13 PM

## 2015-12-11 MED ORDER — GABAPENTIN 100 MG PO CAPS: 100.0000 mg | ORAL_CAPSULE | Freq: Three times a day (TID) | ORAL | Status: DC

## 2015-12-11 MED ORDER — CLONAZEPAM 1 MG PO TABS: 1.0000 mg | ORAL_TABLET | Freq: Three times a day (TID) | ORAL | Status: DC

## 2015-12-11 MED ORDER — ICAPS AREDS 2 PO CAPS: 1.0000 | ORAL_CAPSULE | Freq: Two times a day (BID) | ORAL | Status: DC

## 2015-12-11 MED ORDER — MIRTAZAPINE 7.5 MG PO TABS: ORAL_TABLET | ORAL | Status: DC

## 2015-12-11 NOTE — Progress Notes (Signed)
Pt discharged home with wife. Pt was ambulatory, stable and appreciative at that time. All papers and prescriptions were given and valuables returned. Verbal understanding expressed. Denies SI/HI and A/VH. Pt given opportunity to express concerns and ask questions.  

## 2015-12-11 NOTE — Progress Notes (Signed)
Recreation Therapy Notes  Date: 04.12.2017 Time: 9:30am Location: [address] Room   Group Topic: Stress Management  Goal Area(s) Addresses:  Patient will actively participate in stress management techniques presented during session.   Behavioral Response: Did not attend.    Laureen Ochs Kassady Laboy, LRT/CTRS        Noelia Lenart L 12/11/2015 12:01 PM

## 2015-12-11 NOTE — Tx Team (Signed)
Interdisciplinary Treatment Plan Update (Adult) Date: 12/11/2015   Date: 12/11/2015 4:00 PM  Progress in Treatment:  Attending groups: Intermittently Participating in groups: Minimally Taking medication as prescribed: Yes  Tolerating medication: Yes  Family/Significant othe contact made: Yes with wife Patient understands diagnosis: yes AEB seeking help with his depression and OCD Discussing patient identified problems/goals with staff: Yes  Medical problems stabilized or resolved: Yes  Denies suicidal/homicidal ideation: yes Patient has not harmed self or Others: Yes   New problem(s) identified: None identified at this time.   Discharge Plan or Barriers: Pt will return home and follow-up with outpatient services  Additional comments:  Patient and CSW reviewed pt's identified goals and treatment plan. Patient verbalized understanding and agreed to treatment plan. CSW reviewed [facility] "Discharge Process and Patient Involvement" Form. Pt verbalized understanding of information provided and signed form.   Reason for Continuation of Hospitalization:  Anxiety Depression Medication stabilization Suicidal ideation  Estimated length of stay: 0 days  Review of initial/current patient goals per problem list:   1.  Goal(s): Patient will participate in aftercare plan  Met:  Yes  Target date: 3-5 days from date of admission   As evidenced by: Patient will participate within aftercare plan AEB aftercare provider and housing plan at discharge being identified.   12/06/15:  Pt will return home and follow-up with outpatient services  2.  Goal (s): Patient will exhibit decreased depressive symptoms and suicidal ideations.  Met:  Yes  Target date: 3-5 days from date of admission   As evidenced by: Patient will utilize self rating of depression at 3 or below and demonstrate decreased signs of depression or be deemed stable for discharge by MD.  12/06/15: Pt continues to rate depression at high  levels; denies SI  12/11/15: Pt rates depression at 3/10; denies SI  3.  Goal(s): Patient will demonstrate decreased signs and symptoms of anxiety.  Met:  Adequate for DC  Target date: 3-5 days from date of admission   As evidenced by: Patient will utilize self rating of anxiety at 3 or below and demonstrated decreased signs of anxiety, or be deemed stable for discharge by MD  12/06/15: Pt rates anxiety at "25"/10  12/11/15: Although Pt continues to rate anxiety at high levels, MD feels that Pt's symptoms have decreased to the point that they can be managed in an outpatient setting.   Attendees:  Patient:    Family:    Physician: Dr. Parke Poisson, MD  12/11/2015 4:00 PM  Nursing: Lars Pinks, RN Case manager  12/11/2015 4:00 PM  Clinical Social Worker Peri Maris, LCSWA 12/11/2015 4:00 PM  Other: Erasmo Downer Drinkard, LCSWA 12/11/2015 4:00 PM  Clinical:  Eulogio Bear, RN; Marcella Dubs, RN  12/11/2015 4:00 PM  Other: , RN Charge Nurse 12/11/2015 4:00 PM  Other: Hilda Lias, [place], [place] Social Work [phone]

## 2015-12-11 NOTE — BHH Suicide Risk Assessment (Signed)
[facility] Discharge Suicide Risk Assessment   Principal Problem: Anxiety and depression Discharge Diagnoses:  Patient Active Problem List   Diagnosis Date Noted  . Anxiety and depression [F41.8] 12/05/2015    Total Time spent with patient: 30 minutes  Musculoskeletal: Strength & Muscle Tone: within normal limits Gait & Station: normal Patient leans: N/A  Psychiatric Specialty Exam: ROS  Blood pressure 136/60, pulse 94, temperature 97.9 F (36.6 C), temperature source Oral, resp. rate 18, height 5' 9.75" (1.772 m), weight 197 lb (89.359 kg), SpO2 98 %.Body mass index is 28.46 kg/(m^2).  General Appearance: Fairly Groomed, but improving compared to admission   Eye Contact::  Good  Speech:  Normal Rate409  Volume:  Normal  Mood:  Anxious  Affect:  remains anxious, but more reactive, does smile at times   Thought Process:  Linear  Orientation:  Full (Time, Place, and Person)- fully oriented x 3  Thought Content:  denies hallucinations, no delusions, remains ruminative /anxious, but to lesser degree than on admission   Suicidal Thoughts:  No denies any suicidal or self injurious ideations, denies any homicidal or violent ideations   Homicidal Thoughts:  No  Memory:  grossly intact   Judgement:  Fair  Insight:  Fair  Psychomotor Activity:  Normal- today is calmer, not pacing, no tremors or restlessness noted   Concentration:  Good  Recall:  Good  Fund of Knowledge:Good  Language: Good  Akathisia:  Negative  Handed:  Right  AIMS (if indicated):     Assets:  Desire for Improvement Resilience  Sleep:  Number of Hours: 5.75  Cognition: WNL  ADL's: improving    Mental Status Per Nursing Assessment::   On Admission:  Self-harm thoughts  Demographic Factors:  60 year old married male , lives with wife  Loss Factors: Recent death of family members, recent diagnosis of Macular Degeneration   Historical Factors: long history of anxiety disorder   Risk Reduction Factors:    Sense of responsibility to family, Living with another person, especially a relative, Positive social support and Positive coping skills or problem solving skills  Continued Clinical Symptoms:  At this time patient , although remains anxious, ruminative, is improved compared to admission- denies depression, emphasizes chronic anxiety as main issue affecting him, affect remains anxious but is partially improved and is noted to smile at times and use some humor during interactions, no thought disorder, denies suicidal ideations, denies homicidal ideations , no hallucinations, no delusions expressed, oriented x 3.  Today less focused on medication issues, side effects, and at this time tolerating medications well . Patient wants to discharge, states he would feel more comfortable at home, feels subjectively anxious with level of noise and stimulation inherent to milieu  At his request, have contacted wife, who is very supportive and is agreeing with discharge, will come pick him up later today  Cognitive Features That Contribute To Risk:  No gross cognitive deficits noted upon discharge. Is alert , attentive, and oriented x 3   Suicide Risk:  Mild:  Suicidal ideation of limited frequency, intensity, duration, and specificity.  There are no identifiable plans, no associated intent, mild dysphoria and related symptoms, good self-control (both objective and subjective assessment), few other risk factors, and identifiable protective factors, including available and accessible social support.    Plan Of Care/Follow-up recommendations:  Activity:  as tolerated  Diet:  Regular Tests:  Na Other:  See below  Patient wants to discharge and there are no current grounds for  involuntary commitment  Plans to return home Plans to follow up with Dr. Erling Cruz, psychiatrist at [facility] and is being referred to [facility] Plans to follow up with his ophthalmologist and with his PCP for medical issues Neita Garnet,  MD 12/11/2015, 11:35 AM

## 2015-12-11 NOTE — Progress Notes (Signed)
Pt has been staying in his room most of the time.   He comes out for meals and snacks.  He denies having any suicidal thoughts.  He denies HI/AVH.  He seems very paranoid and slightly confused tonight.  He feels he is worse now than when he came in.  When writer took him his medication, he had to be told to put the med in his mouth and then drink the water to swallow the medicine.  When he is offered meds, he says that they are not going to work anyway.  Pt is very down and depressed.  Support and encouragement offered.  Discharge plans are in process.  He plans to return home at discharge.  Safety maintained with q15 minute checks.

## 2015-12-11 NOTE — Progress Notes (Signed)
  [facility] Adult Case Management Discharge Plan :  Will you be returning to the same living situation after discharge:  Yes,  Pt returning home with wife At discharge, do you have transportation home?: Yes,  Pt wife to pick up Do you have the ability to pay for your medications: Yes,  Pt provided with prescriptions  Release of information consent forms completed and in the chart;  Patient's signature needed at discharge.  Patient to Follow up at: Follow-up Information    Follow up with [facility].   Why:  Referral for therapy sent 12/11/15. Staff should call you within 2-3 business days to schedule. Call office if you do not receive a call by Tuesday April 18th.   Contact information:   [address] [place], [place] [address]  Phone - [phone] Fax - [phone]      Follow up with [facility] On 12/12/2015.   Why:  at  10:45am for medication management with Dr. Ocie Doyne information:   [address] [place] ,  [address] Phone: [phone] Fax: [phone]      Next level of care provider has access to [name] and Suicide Prevention discussed: Yes,  with wife  Have you used any form of tobacco in the last 30 days? (Cigarettes, Smokeless Tobacco, Cigars, and/or Pipes): No  Has patient been referred to the Quitline?: N/A patient is not a smoker  Patient has been referred for addiction treatment: N/A  Bo Mcclintock 12/11/2015, 4:06 PM 2

## 2015-12-11 NOTE — Discharge Summary (Signed)
Physician Discharge Summary Note  Patient:  Joshua Eaton is an 60 y.o., male MRN:  [id number] DOB:  Dec 31, 1955 Patient phone:  [phone] (home)  Patient address:   [place] [address],  Total Time spent with patient: Greater than 30 minutes  Date of Admission:  12/05/2015 Date of Discharge: 12-11-15  Reason for Admission:  Worsening symptoms of depression/anxiety  Principal Problem: Anxiety and depression  Discharge Diagnoses: Patient Active Problem List   Diagnosis Date Noted  . Anxiety and depression [F41.8] 12/05/2015   Past Psychiatric History: Depression/anxiety  Past Medical History:  Past Medical History  Diagnosis Date  . Anxiety   . Depression   . OCD (obsessive compulsive disorder)   . Hypertension   . Cancer [facility])     Past Surgical History  Procedure Laterality Date  . Colon surgery      cancerous polyp removed  . Cholecystectomy    . Hernia repair     Family History: History reviewed. No pertinent family history.  Family Psychiatric  History: See H&P  Social History:  History  Alcohol Use No     History  Drug Use No    Social History   Social History  . Marital Status: Married    Spouse Name: N/A  . Number of Children: N/A  . Years of Education: N/A   Social History Main Topics  . Smoking status: Never Smoker   . Smokeless tobacco: None  . Alcohol Use: No  . Drug Use: No  . Sexual Activity: Not Asked   Other Topics Concern  . None   Social History Narrative   Hospital Course: 60 year old man, reports worsening depression and anxiety,which he attributes partially to the recent death of his mother, father, and sister, who passed away within a few weeks of one another earlier this year. He also states that he has been diagnosed with Macular Degeneration and has been ruminating about possibly losing his eye sight. He reports having a history of OCD, reports long history of obsessions ( does not describe any clear  compulsions), and states he has had these constant " intrusive thoughts " of suicide , in spite of not actually having a plan or intention to do so. Identifies these suicidal ruminations more as an obsessive thought than as a function of depression and again denies an actual plan or intention.  Joshua Eaton was admitted to the adult unit for constant suicidal ideations without any plans or intent to kill himself. He cited his OCD as the reason for the obsessive thoughts of suicide. He did admit worsening symptoms of depression & anxiety since the recent deaths of 3 of his close family members. He was in need of mental health care. During his admission assessment, Joshua Eaton was evaluated and his presenting symptoms were identified. The medication regimen targeting those presenting symptoms were discussed & initiated with his consent. He was oriented to the unit and encouraged to participate in the unit programming. His other pre-existing medical problems were identified and treated appropriately by resuming his pertinent home medication for those health issues (Vitamin supplement).         During the course of his hospitalization, Joshua Eaton was evaluated each day by a clinical provider to ascertain his response to his treatment regimen. As the day goes by, improvement was noted by his report of decreasing symptoms, improved affect, medication tolerance & participation in the unit programming.  He was required on daily basis to complete a self inventory asssessment  noting mood, mental status any new symptoms, anxiety or concerns. Joshua Eaton symptoms responded well to his treatment regimen, being in a therapeutic & supportive environment also assisted in his mood stability. Joshua Eaton did present appropriate behavior & was motivated for recovery. He worked closely with the treatment team & case manager to develop a discharge plan with appropriate goals to maintain mood stability after discharge. Coping skills, problem solving as well as  relaxation therapies were also part of the unit programming.  On this day of his hospital discharge, Joshua Eaton was in much improved condition than upon admission. His symptoms were reported as significantly decreased or resolved completely. Upon discharge, he denies SI/HI and voiced no AVH. He was motivated to continue taking medication with a goal of continued improvement in mental health.He was discharged home with a plan to follow up as noted below. He was discharged on; Gabapentin 100 mg for agitation, Klonopin 1 mg for anxiety & Mirtazapine 7.5 mg for insomnia. He left [facility] with all personal belongings in no apparent distress. Transportation per his arrangement.   Physical Findings: AIMS: Facial and Oral Movements Muscles of Facial Expression: None, normal Lips and Perioral Area: None, normal Jaw: None, normal Tongue: None, normal,Extremity Movements Upper (arms, wrists, hands, fingers): None, normal Lower (legs, knees, ankles, toes): None, normal, Trunk Movements Neck, shoulders, hips: None, normal, Overall Severity Severity of abnormal movements (highest score from questions above): None, normal Incapacitation due to abnormal movements: None, normal Patient's awareness of abnormal movements (rate only patient's report): No Awareness, Dental Status Current problems with teeth and/or dentures?: No Does patient usually wear dentures?: No  CIWA:    COWS:     Musculoskeletal: Strength & Muscle Tone: within normal limits Gait & Station: normal Patient leans: N/A  Psychiatric Specialty Exam: Review of Systems  Constitutional: Negative.   HENT: Negative.   Eyes: Negative.   Respiratory: Negative.   Cardiovascular: Negative.   Gastrointestinal: Negative.   Genitourinary: Negative.   Musculoskeletal: Negative.   Skin: Negative.   Neurological: Negative.   Endo/Heme/Allergies: Negative.   Psychiatric/Behavioral: Positive for depression (Stable). Negative for suicidal ideas,  hallucinations, memory loss and substance abuse. The patient has insomnia (Stable). The patient is not nervous/anxious.     Blood pressure 116/80, pulse 86, temperature 97.9 F (36.6 C), temperature source Oral, resp. rate 18, height 5' 9.75" (1.772 m), weight 89.359 kg (197 lb), SpO2 98 %.Body mass index is 28.46 kg/(m^2).  See Md's SRA   Have you used any form of tobacco in the last 30 days? (Cigarettes, Smokeless Tobacco, Cigars, and/or Pipes): No  Has this patient used any form of tobacco in the last 30 days? (Cigarettes, Smokeless Tobacco, Cigars, and/or Pipes): No  Blood Alcohol level:  No results found for: [facility]  Metabolic Disorder Labs:  No results found for: HGBA1C, MPG No results found for: PROLACTIN No results found for: CHOL, TRIG, HDL, CHOLHDL, VLDL, LDLCALC  See Psychiatric Specialty Exam and Suicide Risk Assessment completed by Attending Physician prior to discharge.  Discharge destination:  Home  Is patient on multiple antipsychotic therapies at discharge:  No   Has Patient had three or more failed trials of antipsychotic monotherapy by history:  No  Recommended Plan for Multiple Antipsychotic Therapies: NA    Medication List    STOP taking these medications        zolpidem 10 MG tablet  Commonly known as:  AMBIEN      TAKE these medications      Indication  clonazePAM 1 MG tablet  Commonly known as:  KLONOPIN  Take 1 tablet (1 mg total) by mouth 3 (three) times daily. For severe anxiety   Indication:  Severe anxiety     gabapentin 100 MG capsule  Commonly known as:  NEURONTIN  Take 1 capsule (100 mg total) by mouth 3 (three) times daily. For agitation   Indication:  Agitation     ICAPS AREDS 2 Caps  Take 1 capsule by mouth 2 (two) times daily. For eye care   Indication:  Vitamin and Mineral Deficiency     mirtazapine 7.5 MG tablet  Commonly known as:  REMERON  Take 1 tablet (7.5 mg) at bedtime as needed: For sleep   Indication:  Trouble Sleeping        Follow-up Information    Follow up with [facility].   Why:  Referral for therapy sent 12/11/15. Staff should call you within 2-3 business days to schedule. Call office if you do not receive a call by Tuesday April 18th.   Contact information:   [address] [place], [place] [address]  Phone - [phone] Fax - [phone]      Follow up with [facility] On 12/12/2015.   Why:  at  10:45am for medication management with Dr. Ocie Doyne information:   [address] [place] , [place] [address] Phone: [phone] Fax: [phone]    Follow-up recommendations: Activity:  As tolerated Diet: As recommended by your primary care doctor. Keep all scheduled follow-up appointments as recommended.    Comments: Take all your medications as prescribed by your mental healthcare provider. Report any adverse effects and or reactions from your medicines to your outpatient provider promptly. Patient is instructed and cautioned to not engage in alcohol and or illegal drug use while on prescription medicines. In the event of worsening symptoms, patient is instructed to call the crisis hotline, 911 and or go to the nearest ED for appropriate evaluation and treatment of symptoms. Follow-up with your primary care provider for your other medical issues, concerns and or health care needs.   Signed: Encarnacion Slates, NP, PMHNP-BC 12/11/2015, 3:26 PM   Patient seen, Suicide Assessment Completed.  Disposition Plan Reviewed

## 2015-12-12 LAB — HEMOGLOBIN A1C
Hgb A1c MFr Bld: 5.1 % (ref 4.8–5.6)
MEAN PLASMA GLUCOSE: 100 mg/dL

## 2016-02-03 ENCOUNTER — Encounter (HOSPITAL_COMMUNITY): Payer: Self-pay | Admitting: Emergency Medicine

## 2016-02-03 ENCOUNTER — Ambulatory Visit (HOSPITAL_COMMUNITY)
Admission: RE | Admit: 2016-02-03 | Discharge: 2016-02-03 | Disposition: A | Discharge status: Home / Self Care | Payer: 59 | Attending: Psychiatry | Admitting: Psychiatry

## 2016-02-03 ENCOUNTER — Emergency Department (HOSPITAL_COMMUNITY)
Admission: EM | Admit: 2016-02-03 | Discharge: 2016-02-04 | Disposition: A | Discharge status: Other Acute Inpatient Hospital | Payer: 59 | Attending: Emergency Medicine | Admitting: Emergency Medicine

## 2016-02-03 DIAGNOSIS — Z859 Personal history of malignant neoplasm, unspecified: Secondary | ICD-10-CM | POA: Insufficient documentation

## 2016-02-03 DIAGNOSIS — R45850 Homicidal ideations: Secondary | ICD-10-CM | POA: Diagnosis not present

## 2016-02-03 DIAGNOSIS — F332 Major depressive disorder, recurrent severe without psychotic features: Secondary | ICD-10-CM | POA: Insufficient documentation

## 2016-02-03 DIAGNOSIS — F429 Obsessive-compulsive disorder, unspecified: Secondary | ICD-10-CM | POA: Diagnosis present

## 2016-02-03 DIAGNOSIS — I10 Essential (primary) hypertension: Secondary | ICD-10-CM | POA: Diagnosis not present

## 2016-02-03 DIAGNOSIS — R45851 Suicidal ideations: Secondary | ICD-10-CM | POA: Diagnosis not present

## 2016-02-03 DIAGNOSIS — Z049 Encounter for examination and observation for unspecified reason: Secondary | ICD-10-CM

## 2016-02-03 LAB — COMPREHENSIVE METABOLIC PANEL
ALK PHOS: 49 U/L (ref 38–126)
ALT: 24 U/L (ref 17–63)
ANION GAP: 9 (ref 5–15)
AST: 23 U/L (ref 15–41)
Albumin: 3.8 g/dL (ref 3.5–5.0)
BUN: 21 mg/dL — ABNORMAL HIGH (ref 6–20)
CALCIUM: 8.9 mg/dL (ref 8.9–10.3)
CO2: 19 mmol/L — AB (ref 22–32)
CREATININE: 1.02 mg/dL (ref 0.61–1.24)
Chloride: 110 mmol/L (ref 101–111)
Glucose, Bld: 212 mg/dL — ABNORMAL HIGH (ref 65–99)
Potassium: 3.5 mmol/L (ref 3.5–5.1)
SODIUM: 138 mmol/L (ref 135–145)
Total Bilirubin: 0.6 mg/dL (ref 0.3–1.2)
Total Protein: 6.6 g/dL (ref 6.5–8.1)

## 2016-02-03 LAB — CBC
HCT: 45.4 % (ref 39.0–52.0)
HEMOGLOBIN: 16.3 g/dL (ref 13.0–17.0)
MCH: 31.9 pg (ref 26.0–34.0)
MCHC: 35.9 g/dL (ref 30.0–36.0)
MCV: 88.8 fL (ref 78.0–100.0)
PLATELETS: 138 10*3/uL — AB (ref 150–400)
RBC: 5.11 MIL/uL (ref 4.22–5.81)
RDW: 13.1 % (ref 11.5–15.5)
WBC: 10.1 10*3/uL (ref 4.0–10.5)

## 2016-02-03 LAB — ACETAMINOPHEN LEVEL

## 2016-02-03 LAB — RAPID URINE DRUG SCREEN, HOSP PERFORMED
Amphetamines: NOT DETECTED
Barbiturates: NOT DETECTED
Benzodiazepines: POSITIVE — AB
COCAINE: NOT DETECTED
OPIATES: NOT DETECTED
TETRAHYDROCANNABINOL: NOT DETECTED

## 2016-02-03 LAB — SALICYLATE LEVEL

## 2016-02-03 LAB — ETHANOL

## 2016-02-03 MED ORDER — ACETAMINOPHEN 325 MG PO TABS: 650.0000 mg | ORAL_TABLET | ORAL | Status: DC | PRN

## 2016-02-03 MED ORDER — TOPIRAMATE 25 MG PO TABS
50.0000 mg | ORAL_TABLET | Freq: Two times a day (BID) | ORAL | Status: DC
Start: 2016-02-03 — End: 2016-02-04
  Administered 2016-02-03 – 2016-02-04 (×2): 50 mg via ORAL
  Filled 2016-02-03 (×2): qty 2

## 2016-02-03 MED ORDER — LORAZEPAM 1 MG PO TABS
1.0000 mg | ORAL_TABLET | Freq: Three times a day (TID) | ORAL | Status: DC
  Administered 2016-02-03 – 2016-02-04 (×3): 1 mg via ORAL
  Filled 2016-02-03 (×3): qty 1

## 2016-02-03 MED ORDER — GABAPENTIN 100 MG PO CAPS
100.0000 mg | ORAL_CAPSULE | Freq: Three times a day (TID) | ORAL | Status: DC
  Administered 2016-02-03: 100 mg via ORAL
  Administered 2016-02-03: 300 mg via ORAL
  Administered 2016-02-04: 100 mg via ORAL
  Filled 2016-02-03: qty 2
  Filled 2016-02-03: qty 1
  Filled 2016-02-03: qty 3
  Filled 2016-02-03: qty 1

## 2016-02-03 MED ORDER — ONDANSETRON HCL 4 MG PO TABS: 4.0000 mg | ORAL_TABLET | Freq: Three times a day (TID) | ORAL | Status: DC | PRN

## 2016-02-03 MED ORDER — TRAZODONE HCL 100 MG PO TABS
100.0000 mg | ORAL_TABLET | Freq: Every evening | ORAL | Status: DC | PRN
  Administered 2016-02-03: 100 mg via ORAL
  Filled 2016-02-03: qty 1

## 2016-02-03 MED ORDER — FLUOXETINE HCL 20 MG PO CAPS
60.0000 mg | ORAL_CAPSULE | Freq: Every day | ORAL | Status: DC
  Administered 2016-02-04: 60 mg via ORAL
  Filled 2016-02-03 (×2): qty 3

## 2016-02-03 MED ORDER — ASENAPINE MALEATE 5 MG SL SUBL
10.0000 mg | SUBLINGUAL_TABLET | Freq: Two times a day (BID) | SUBLINGUAL | Status: DC
  Administered 2016-02-03 – 2016-02-04 (×2): 10 mg via SUBLINGUAL
  Filled 2016-02-03 (×3): qty 2

## 2016-02-03 MED ORDER — MIRTAZAPINE 30 MG PO TABS
15.0000 mg | ORAL_TABLET | Freq: Every day | ORAL | Status: DC
  Administered 2016-02-03: 15 mg via ORAL
  Filled 2016-02-03: qty 1

## 2016-02-03 NOTE — ED Notes (Signed)
Pt states that he has been having harmful thoughts toward his wife. Denies SI. Sent here from [facility] to seek placement possibly at [facility]. Alert and oriented.

## 2016-02-03 NOTE — ED Provider Notes (Signed)
CSN: [id number]     Arrival date & time 02/03/16  1633 History   First MD Initiated Contact with Patient 02/03/16 1654     Chief Complaint  Patient presents with  . Homicidal     (Consider location/radiation/quality/duration/timing/severity/associated sxs/prior Treatment) HPI Patient has had recent recurrent hospitalizations for depression. His wife reports that he is temporarily improved after leaving the hospital but then becomes very severely depressed and preoccupied with anxiety about hurting someone else. Patient reports he is very anxious that he will hurt his wife and he does not want to do that. His wife reports that she does not feel that he is a danger to her but he is becoming increasingly anxious and preoccupied to the point that is nonfunctional. Patient denies any self injury. He has not ingested any medications or tried to harm himself. He also expresses concerns that he is watched very carefully when he is in the hospital because he is fearful that he will hurt someone else in the hospital ward. Past Medical History  Diagnosis Date  . Anxiety   . Depression   . OCD (obsessive compulsive disorder)   . Hypertension   . Cancer [facility])    Past Surgical History  Procedure Laterality Date  . Colon surgery      cancerous polyp removed  . Cholecystectomy    . Hernia repair     History reviewed. No pertinent family history. Social History  Substance Use Topics  . Smoking status: Never Smoker   . Smokeless tobacco: None  . Alcohol Use: No    Review of Systems 10 Systems reviewed and are negative for acute change except as noted in the HPI.   Allergies  Codeine  Home Medications   Prior to Admission medications   Medication Sig Start Date End Date Taking? Authorizing Provider  Asenapine Maleate 10 MG SUBL Place 10 mg under the tongue 2 (two) times daily.   Yes Historical Provider, MD  FLUoxetine (PROZAC) 20 MG capsule Take 60 mg by mouth daily.   Yes Historical  Provider, MD  gabapentin (NEURONTIN) 100 MG capsule Take 1 capsule (100 mg total) by mouth 3 (three) times daily. For agitation Patient taking differently: Take 100-300 mg by mouth 3 (three) times daily. 100 mg every morning, 100 mg every afternoon, 300 mg every night 12/11/15  Yes Encarnacion Slates, NP  hydrOXYzine (ATARAX/VISTARIL) 25 MG tablet Take 25 mg by mouth every 6 (six) hours as needed (sleep).   Yes Historical Provider, MD  LORazepam (ATIVAN) 1 MG tablet Take 1 mg by mouth 3 (three) times daily.   Yes Historical Provider, MD  mirtazapine (REMERON) 30 MG tablet Take 15 mg by mouth at bedtime.   Yes Historical Provider, MD  Multiple Vitamins-Minerals (ICAPS AREDS 2) CAPS Take 1 capsule by mouth 2 (two) times daily. For eye care 12/11/15  Yes Encarnacion Slates, NP  topiramate (TOPAMAX) 50 MG tablet Take 50 mg by mouth 2 (two) times daily. 01/29/16  Yes Historical Provider, MD  traZODone (DESYREL) 100 MG tablet Take 100-150 mg by mouth at bedtime as needed for sleep.   Yes Historical Provider, MD  clonazePAM (KLONOPIN) 1 MG tablet Take 1 tablet (1 mg total) by mouth 3 (three) times daily. For severe anxiety Patient not taking: Reported on 02/03/2016 12/11/15   Encarnacion Slates, NP  mirtazapine (REMERON) 7.5 MG tablet Take 1 tablet (7.5 mg) at bedtime as needed: For sleep Patient not taking: Reported on 02/03/2016 12/11/15  Encarnacion Slates, NP   BP 147/86 mmHg  Pulse 82  Temp(Src) 98.7 F (37.1 C) (Oral)  Resp 16  SpO2 96% Physical Exam  Constitutional: He is oriented to person, place, and time. He appears well-developed and well-nourished.  HENT:  Head: Normocephalic and atraumatic.  Eyes: EOM are normal. Pupils are equal, round, and reactive to light.  Neck: Neck supple.  Cardiovascular: Normal rate, regular rhythm, normal heart sounds and intact distal pulses.   Pulmonary/Chest: Effort normal and breath sounds normal.  Abdominal: Soft. Bowel sounds are normal. He exhibits no distension. There is no  tenderness.  Musculoskeletal: Normal range of motion. He exhibits no edema.  Neurological: He is alert and oriented to person, place, and time. He has normal strength. Coordination normal. GCS eye subscore is 4. GCS verbal subscore is 5. GCS motor subscore is 6.  Skin: Skin is warm, dry and intact.  Psychiatric: He has a normal mood and affect.    ED Course  Procedures (including critical care time) Labs Review Labs Reviewed  COMPREHENSIVE METABOLIC PANEL - Abnormal; Notable for the following:    CO2 19 (*)    Glucose, Bld 212 (*)    BUN 21 (*)    All other components within normal limits  ACETAMINOPHEN LEVEL - Abnormal; Notable for the following:    Acetaminophen (Tylenol), Serum <10 (*)    All other components within normal limits  CBC - Abnormal; Notable for the following:    Platelets 138 (*)    All other components within normal limits  URINE RAPID DRUG SCREEN, HOSP PERFORMED - Abnormal; Notable for the following:    Benzodiazepines POSITIVE (*)    All other components within normal limits  URINALYSIS, ROUTINE W REFLEX MICROSCOPIC (NOT AT [facility]) - Abnormal; Notable for the following:    APPearance CLOUDY (*)    All other components within normal limits  ETHANOL  SALICYLATE LEVEL    Imaging Review Dg Chest 2 View  02/04/2016  CLINICAL DATA:  60 year old male - respiratory examination for behavioral health. EXAM: CHEST  2 VIEW COMPARISON:  01/21/2016 FINDINGS: The cardiomediastinal silhouette is unremarkable. There is no evidence of focal airspace disease, pulmonary edema, suspicious pulmonary nodule/mass, pleural effusion, or pneumothorax. No acute bony abnormalities are identified. IMPRESSION: No active cardiopulmonary disease. Electronically Signed   By: Margarette Canada M.D.   On: 02/04/2016 12:38   I have personally reviewed and evaluated these images and lab results as part of my medical decision-making.   EKG Interpretation   Date/Time:  Tuesday February 04 2016 12:39:10  EDT Ventricular Rate:  73 PR Interval:  204 QRS Duration: 88 QT Interval:  398 QTC Calculation: 438 R Axis:   -11 Text Interpretation:  Normal sinus rhythm Normal ECG pvcs resolved since  last tracing Confirmed by KNAPP  MD-J, JON [id number]) on 02/04/2016 3:23:29 PM      MDM   Final diagnoses:  Severe recurrent major depression without psychotic features [facility])   Patient is medically cleared. He is alert and nontoxic. His vital signs are stable. Patient has had severe paranoia of injury to others. Patient has taken no action on these thoughts.    Charlesetta Shanks, MD 02/05/16 [phone]

## 2016-02-03 NOTE — BH Assessment (Signed)
Tele Assessment Note   Joshua Eaton is an 60 y.o. male.  Joshua Eaton is an 60 y.o. male. Pt presents voluntarily to [facility] for assessment accompanied by his wife of 56 years, Joshua Eaton. Pt is cooperative and oriented x 4. He appears anxious and preoccupied. Writer assessed pt as walk in on 12/05/15 after which time pt was admitted to [facility] for SI. Today pt endorses HI towards his wife. He denies intent or plan. Pt denies access to weapons. He says, "I would never hurt her." Pt reports that the homicidal thoughts "are wearing me out." He says since his d/c from [facility] in mid-April, pt has been admitted to [facility] inpatient psych for 3 weeks and then admitted to [facility]. Wife says he was d/c from [facility] on 01/29/16. He endorses severe anxiety. Pt reports insomnia and only sleeping three hours nightly. His appetite is good. Pt endorses insomnia, loss of interest in usual pleasures, isolating behavior, irritability and hopelessness. Pt denies hx of substance abuse. He says his psychiatrist is Dr Lendon Colonel and his PCP is Dr Rex Kras. Pt denies hx of suicide attempts. Pt says he is afraid to be alone. Pt says he is afraid he might hurt someone else. He asked if whichever hospital he goes to will watch him carefully so he won't harm anyone. Pt denies hx of violence.   Wife says, "I'm at my wit's end." She says pt has been inpatient so often that he has missed his [facility] neurology appt scheduled for last week. She also says he was supposed to start outpatient treatment today at [facility]. Wife says pt was so worried about harming wife last night that she had to get his sisters to visit to help calm him down. She says that she is not worried at all that pt will actually try to harm wife. She says that pt seems to do well for "three or four days" after d/c from an inpatient unit but then "he gets worse again". Wife reports pt's mood has been "doom and gloom." She says pt is won't leave the house. Wife reports pt  used to be outgoing and very social. She says pt's father, sister and mother have all died in the past five months. She also reports pt has recently been dx with macular degeneration. Wife says pt's anxiety and depression worsened approx seven months ago when his parents' health began going quickly downhill. She reports pt is very sensitive to medications. She says she is worried as [place] professionals haven't seemed to be able to find psych meds which will decrease pt's anxiety.   Diagnosis:  MDD, Recurrent, Severe without Psychotic Features Generalized Anxiety Disorder  Past Medical History:  Past Medical History  Diagnosis Date  . Anxiety   . Depression   . OCD (obsessive compulsive disorder)   . Hypertension   . Cancer [facility])     Past Surgical History  Procedure Laterality Date  . Colon surgery      cancerous polyp removed  . Cholecystectomy    . Hernia repair      Family History: No family history on file.  Social History:  reports that he has never smoked. He does not have any smokeless tobacco history on file. He reports that he does not drink alcohol or use illicit drugs.  Additional Social History:  Alcohol / Drug Use Pain Medications: pt denies abuse - see pta meds list Prescriptions: pt denies abuse - see pta meds  list Over the Counter: pt denies abuse - see pta meds list History of alcohol / drug use?: No history of alcohol / drug abuse Longest period of sobriety (when/how long): n/a  CIWA:   COWS:    PATIENT STRENGTHS: (choose at least two) Average or above average intelligence Communication skills Motivation for treatment/growth Supportive family/friends  Allergies:  Allergies  Allergen Reactions  . Codeine     "sends me into orbit"    Home Medications:  (Not in a hospital admission)  OB/GYN Status:  No LMP for male patient.  General Assessment Data Location of Assessment: [facility] TTS Assessment: In system Is this a Tele or  Face-to-Face Assessment?: Face-to-Face Is this an Initial Assessment or a Re-assessment for this encounter?: Initial Assessment Marital status: Married Is patient pregnant?: No Pregnancy Status: No Living Arrangements: Spouse/significant other Can pt return to current living arrangement?: Yes Admission Status: Voluntary Is patient capable of signing voluntary admission?: Yes Referral Source: Self/Family/Friend Insurance type: united healthcare     Crisis Care Plan Living Arrangements: Spouse/significant other Name of Psychiatrist: Dr Lendon Colonel Name of Therapist: none  Education Status Is patient currently in school?: No Highest grade of school patient has completed: 9  Risk to self with the past 6 months Suicidal Ideation: No Has patient been a risk to self within the past 6 months prior to admission? : No Suicidal Intent: No Has patient had any suicidal intent within the past 6 months prior to admission? : No Is patient at risk for suicide?: No Suicidal Plan?: No Has patient had any suicidal plan within the past 6 months prior to admission? : No Access to Means: No What has been your use of drugs/alcohol within the last 12 months?: none Previous Attempts/Gestures: No How many times?: 0 Other Self Harm Risks: none Triggers for Past Attempts:  (n/a) Intentional Self Injurious Behavior: None Family Suicide History: No (fam hx of mental illness & substance abuse) Recent stressful life event(s): Other (Comment) (HI towards wife although denies intent or plan) Persecutory voices/beliefs?: No Depression: Yes Depression Symptoms: Tearfulness, Feeling angry/irritable, Loss of interest in usual pleasures, Fatigue, Isolating, Insomnia Substance abuse history and/or treatment for substance abuse?: No Suicide prevention information given to non-admitted patients: Not applicable  Risk to Others within the past 6 months Homicidal Ideation: Yes-Currently Present Does patient have  any lifetime risk of violence toward others beyond the six months prior to admission? : No Thoughts of Harm to Others: Yes-Currently Present Comment - Thoughts of Harm to Others: pt reports HI towards wife but denies intent or plan Current Homicidal Intent: No Current Homicidal Plan: No Access to Homicidal Means: No Identified Victim: wife History of harm to others?: No Assessment of Violence: None Noted Violent Behavior Description: pt denies hx violence (wife reports she isn't worried about him harming her) Does patient have access to weapons?: No Criminal Charges Pending?: No Does patient have a court date: No Is patient on probation?: No  Psychosis Hallucinations: None noted Delusions: None noted  Mental Status Report Appearance/Hygiene: Unremarkable Eye Contact: Fair Motor Activity: Freedom of movement, Restlessness Speech: Logical/coherent, Soft Level of Consciousness: Alert Mood: Fearful, Anxious Affect: Anxious, Preoccupied Anxiety Level: Severe Panic attack frequency: daily a few mos ago Thought Processes: Relevant, Coherent Judgement: Impaired Orientation: Person, Place, Time, Situation Obsessive Compulsive Thoughts/Behaviors: Moderate  Cognitive Functioning Concentration: Decreased Memory: Remote Intact, Recent Intact IQ: Average Insight: Fair Impulse Control: Fair Appetite: Poor Sleep: Decreased Total Hours of Sleep: 3 Vegetative Symptoms:  None  ADLScreening [facility] Assessment Services) Patient's cognitive ability adequate to safely complete daily activities?: Yes Patient able to express need for assistance with ADLs?: Yes Independently performs ADLs?: Yes (appropriate for developmental age)  Prior Inpatient Therapy Prior Inpatient Therapy: Yes Prior Therapy Dates: over the past year Prior Therapy Facilty/Provider(s): [facility], [facility], [facility] Reason for Treatment: anxiety, depression, SI  Prior Outpatient Therapy Prior Outpatient Therapy:  Yes Prior Therapy Dates: recently began Prior Therapy Facilty/Provider(s): Dr Erling Cruz Reason for Treatment: med management Does patient have an ACCT team?: No Does patient have Intensive In-House Services?  : No Does patient have [facility] services? : No Does patient have P4CC services?: No  ADL Screening (condition at time of admission) Patient's cognitive ability adequate to safely complete daily activities?: Yes Is the patient deaf or have difficulty hearing?: No Does the patient have difficulty seeing, even when wearing glasses/contacts?: No Does the patient have difficulty concentrating, remembering, or making decisions?: Yes Patient able to express need for assistance with ADLs?: Yes Does the patient have difficulty dressing or bathing?: No Independently performs ADLs?: Yes (appropriate for developmental age) Does the patient have difficulty walking or climbing stairs?: No Weakness of Legs: None Weakness of Arms/Hands: None  Home Assistive Devices/Equipment Home Assistive Devices/Equipment: None    Abuse/Neglect Assessment (Assessment to be complete while patient is alone) Physical Abuse: Denies Verbal Abuse: Denies Sexual Abuse: Denies Exploitation of patient/patient's resources: Denies Self-Neglect: Denies     Regulatory affairs officer (For Healthcare) Does patient have an advance directive?: No Would patient like information on creating an advanced directive?: No - patient declined information    Additional Information 1:1 In Past 12 Months?: Yes CIRT Risk: No Elopement Risk: No Does patient have medical clearance?: No     Disposition:  Disposition Initial Assessment Completed for this Encounter: Yes Disposition of Patient: Inpatient treatment program Type of inpatient treatment program: Adult (laura davis NP recommends geropsych)  Equan Cogbill P 02/03/2016 3:48 PM

## 2016-02-03 NOTE — ED Notes (Signed)
Patient denies SI and AVH. Patient admits to HI towards wife with no plan. Patient has family at bedside. Plan of care discussed with patient. Patient voices no complaints or concerns at this time. Encouragement and support provided and safety maintain. Q 15 min safety checks remain in place.

## 2016-02-04 ENCOUNTER — Emergency Department (HOSPITAL_COMMUNITY): Payer: 59

## 2016-02-04 DIAGNOSIS — R45850 Homicidal ideations: Secondary | ICD-10-CM | POA: Diagnosis not present

## 2016-02-04 DIAGNOSIS — R45851 Suicidal ideations: Secondary | ICD-10-CM | POA: Diagnosis not present

## 2016-02-04 DIAGNOSIS — F429 Obsessive-compulsive disorder, unspecified: Secondary | ICD-10-CM | POA: Diagnosis not present

## 2016-02-04 DIAGNOSIS — F332 Major depressive disorder, recurrent severe without psychotic features: Secondary | ICD-10-CM | POA: Diagnosis not present

## 2016-02-04 LAB — URINALYSIS, ROUTINE W REFLEX MICROSCOPIC
Bilirubin Urine: NEGATIVE
GLUCOSE, UA: NEGATIVE mg/dL
HGB URINE DIPSTICK: NEGATIVE
Ketones, ur: NEGATIVE mg/dL
Leukocytes, UA: NEGATIVE
Nitrite: NEGATIVE
Protein, ur: NEGATIVE mg/dL
SPECIFIC GRAVITY, URINE: 1.01 (ref 1.005–1.030)
pH: 7 (ref 5.0–8.0)

## 2016-02-04 MED ORDER — LORAZEPAM 1 MG PO TABS
1.0000 mg | ORAL_TABLET | Freq: Four times a day (QID) | ORAL | Status: DC | PRN
  Administered 2016-02-04 (×2): 1 mg via ORAL
  Filled 2016-02-04 (×2): qty 1

## 2016-02-04 MED ORDER — ZIPRASIDONE HCL 20 MG PO CAPS
20.0000 mg | ORAL_CAPSULE | Freq: Every day | ORAL | Status: DC
  Administered 2016-02-04: 20 mg via ORAL
  Filled 2016-02-04: qty 1

## 2016-02-04 MED ORDER — GABAPENTIN 100 MG PO CAPS: 200.0000 mg | ORAL_CAPSULE | Freq: Two times a day (BID) | ORAL | Status: DC

## 2016-02-04 MED ORDER — TRAZODONE HCL 50 MG PO TABS: 50.0000 mg | ORAL_TABLET | Freq: Every day | ORAL | Status: DC

## 2016-02-04 NOTE — BH Assessment (Signed)
[facility] Assessment Progress Note  Per Corena Pilgrim, MD, this pt requires psychiatric hospitalization at this time.  Ebony Hail calls from [facility] to report that they will accept pt for admission provided that he is placed under IVC.  Waylan Boga, DNP, opines that pt meets criteria for IVC, and EDP Dorie Rank, MD concurs with this opinion.  He has initiated IVC.  IVC documents have been faxed to [facility], and at 15:57 [employer] confirmed receipt.  As of this writing, service of Findings and Custody Order is pending.  Petition and First Examination have been faxed to [facility].  At 16:04 Ebony Hail reports that pt has been accepted to their facility by Dr Mina Marble.  Dr Tomi Bamberger concurs with this decision.  Please call report to [phone].  Pt's nurse, Dawnaly, has been notified, and agrees to call report.  Pt is to be transported via [facility].  Jalene Mullet, [place] Triage Specialist [phone]

## 2016-02-04 NOTE — Progress Notes (Signed)
CSW called various facilities to assist with placement:  Joshua Eaton- left message [facility]- adult beds available, one gero male [place]- at capacity for adult and gero [place] for male and male [place]- no adult, wait list for [name]- left message [facility]- adult beds available, no adolescent [facility] [place]- at [place]- at capacity for adolescent, adult and [place]- no adult male, no gero Audiological [facility]- gero available [place]- no gero, four adult beds available [facility]- gero beds available [facility]- gero beds available  Genice Rouge [id number] ED CSW 02/04/2016 1:57 PM

## 2016-02-04 NOTE — Progress Notes (Addendum)
CSW received a telephone call from [place] with [facility] who states patient should be IVC due to being homicidal. CSW informed TTS and provided the contact number to speak with their facility [phone].  Genice Rouge [id number] ED CSW 02/04/2016 3:21 PM

## 2016-02-04 NOTE — Progress Notes (Signed)
This writer completed a chart review for disposition.    Jobani Sabado, MSW, LCSW, LCAS [facility] Triage Specialist [phone] 

## 2016-02-04 NOTE — Consult Note (Signed)
[facility] Face-to-Face Psychiatry Consult   Reason for Consult:  Homicidal ideations, anxiety Referring Physician:  EDP Patient Identification: Joshua Eaton MRN:  [id number] Principal Diagnosis: Severe recurrent major depression without psychotic features [facility]) Diagnosis:   Patient Active Problem List   Diagnosis Date Noted  . Severe recurrent major depression without psychotic features ([place]) [F33.2] 02/04/2016    Priority: High  . Obsessive compulsive disorder [F42.9] 02/04/2016    Priority: High    Total Time spent with patient: 45 minutes  Subjective:   Joshua Eaton is a 60 y.o. male patient admitted with homicidal ideations and increase in depression and anxiety.  HPI:  60 yo male who presented to the ED with an increase in anxiety, depression, and homicidal ideations.    Past Psychiatric History: depression, anxiety, OCD   Risk to Self: Is patient at risk for suicide?: No Risk to Others:  wife Prior Inpatient Therapy:  yes Prior Outpatient Therapy:  [facility]  Past Medical History:  Past Medical History  Diagnosis Date  . Anxiety   . Depression   . OCD (obsessive compulsive disorder)   . Hypertension   . Cancer [facility])     Past Surgical History  Procedure Laterality Date  . Colon surgery      cancerous polyp removed  . Cholecystectomy    . Hernia repair     Family History: History reviewed. No pertinent family history. Family Psychiatric  History: none Social History:  History  Alcohol Use No     History  Drug Use No    Social History   Social History  . Marital Status: Married    Spouse Name: N/A  . Number of Children: N/A  . Years of Education: N/A   Social History Main Topics  . Smoking status: Never Smoker   . Smokeless tobacco: None  . Alcohol Use: No  . Drug Use: No  . Sexual Activity: Not Asked   Other Topics Concern  . None   Social History Narrative   Additional Social History:    Allergies:   Allergies  Allergen Reactions  . Codeine    "sends me into orbit"    Labs:  Results for orders placed or performed during the hospital encounter of 02/03/16 (from the past 48 hour(s))  Comprehensive metabolic panel     Status: Abnormal   Collection Time: 02/03/16  5:08 PM  Result Value Ref Range   Sodium 138 135 - 145 mmol/L   Potassium 3.5 3.5 - 5.1 mmol/L   Chloride 110 101 - 111 mmol/L   CO2 19 (L) 22 - 32 mmol/L   Glucose, Bld 212 (H) 65 - 99 mg/dL   BUN 21 (H) 6 - 20 mg/dL   Creatinine, Ser 1.02 0.61 - 1.24 mg/dL   Calcium 8.9 8.9 - 10.3 mg/dL   Total Protein 6.6 6.5 - 8.1 g/dL   Albumin 3.8 3.5 - 5.0 g/dL   AST 23 15 - 41 U/L   ALT 24 17 - 63 U/L   Alkaline Phosphatase 49 38 - 126 U/L   Total Bilirubin 0.6 0.3 - 1.2 mg/dL   GFR calc non Af Amer >60 >60 mL/min   GFR calc Af Amer >60 >60 mL/min    Comment: (NOTE) The eGFR has been calculated using the CKD EPI equation. This calculation has not been validated in all clinical situations. eGFR's persistently <60 mL/min signify possible Chronic Kidney Disease.    Anion gap 9 5 - 15  Ethanol  Status: None   Collection Time: 02/03/16  5:08 PM  Result Value Ref Range   Alcohol, Ethyl (B) <5 <5 mg/dL    Comment:        LOWEST DETECTABLE LIMIT FOR SERUM ALCOHOL IS 5 mg/dL FOR MEDICAL PURPOSES ONLY   Salicylate level     Status: None   Collection Time: 02/03/16  5:08 PM  Result Value Ref Range   Salicylate Lvl <8.2 2.8 - 30.0 mg/dL  Acetaminophen level     Status: Abnormal   Collection Time: 02/03/16  5:08 PM  Result Value Ref Range   Acetaminophen (Tylenol), Serum <10 (L) 10 - 30 ug/mL    Comment:        THERAPEUTIC CONCENTRATIONS VARY SIGNIFICANTLY. A RANGE OF 10-30 ug/mL MAY BE AN EFFECTIVE CONCENTRATION FOR MANY PATIENTS. HOWEVER, SOME ARE BEST TREATED AT CONCENTRATIONS OUTSIDE THIS RANGE. ACETAMINOPHEN CONCENTRATIONS >150 ug/mL AT 4 HOURS AFTER INGESTION AND >50 ug/mL AT 12 HOURS AFTER INGESTION ARE OFTEN ASSOCIATED WITH TOXIC REACTIONS.   cbc      Status: Abnormal   Collection Time: 02/03/16  5:08 PM  Result Value Ref Range   WBC 10.1 4.0 - 10.5 K/uL   RBC 5.11 4.22 - 5.81 MIL/uL   Hemoglobin 16.3 13.0 - 17.0 g/dL   HCT 45.4 39.0 - 52.0 %   MCV 88.8 78.0 - 100.0 fL   MCH 31.9 26.0 - 34.0 pg   MCHC 35.9 30.0 - 36.0 g/dL   RDW 13.1 11.5 - 15.5 %   Platelets 138 (L) 150 - 400 K/uL  Rapid urine drug screen (hospital performed)     Status: Abnormal   Collection Time: 02/03/16  7:36 PM  Result Value Ref Range   Opiates NONE DETECTED NONE DETECTED   Cocaine NONE DETECTED NONE DETECTED   Benzodiazepines POSITIVE (A) NONE DETECTED   Amphetamines NONE DETECTED NONE DETECTED   Tetrahydrocannabinol NONE DETECTED NONE DETECTED   Barbiturates NONE DETECTED NONE DETECTED    Comment:        DRUG SCREEN FOR MEDICAL PURPOSES ONLY.  IF CONFIRMATION IS NEEDED FOR ANY PURPOSE, NOTIFY LAB WITHIN 5 DAYS.        LOWEST DETECTABLE LIMITS FOR URINE DRUG SCREEN Drug Class       Cutoff (ng/mL) Amphetamine      1000 Barbiturate      200 Benzodiazepine   423 Tricyclics       536 Opiates          300 Cocaine          300 THC              50     Current Facility-Administered Medications  Medication Dose Route Frequency Provider Last Rate Last Dose  . acetaminophen (TYLENOL) tablet 650 mg  650 mg Oral Q4H PRN Charlesetta Shanks, MD      . FLUoxetine (PROZAC) capsule 60 mg  60 mg Oral Daily Charlesetta Shanks, MD   60 mg at 02/04/16 1443  . gabapentin (NEURONTIN) capsule 200 mg  200 mg Oral BID Raigen Jagielski, MD      . LORazepam (ATIVAN) tablet 1 mg  1 mg Oral Q6H PRN Colton Engdahl, MD      . ondansetron (ZOFRAN) tablet 4 mg  4 mg Oral Q8H PRN Charlesetta Shanks, MD      . topiramate (TOPAMAX) tablet 50 mg  50 mg Oral BID Corena Pilgrim, MD   50 mg at 02/04/16 1540  . traZODone (DESYREL) tablet 50  mg  50 mg Oral QHS Lular Letson, MD      . ziprasidone (GEODON) capsule 20 mg  20 mg Oral Q1200 Corena Pilgrim, MD       Current Outpatient  Prescriptions  Medication Sig Dispense Refill  . Asenapine Maleate 10 MG SUBL Place 10 mg under the tongue 2 (two) times daily.    Marland Kitchen FLUoxetine (PROZAC) 20 MG capsule Take 60 mg by mouth daily.    Marland Kitchen gabapentin (NEURONTIN) 100 MG capsule Take 1 capsule (100 mg total) by mouth 3 (three) times daily. For agitation (Patient taking differently: Take 100-300 mg by mouth 3 (three) times daily. 100 mg every morning, 100 mg every afternoon, 300 mg every night) 90 capsule 0  . hydrOXYzine (ATARAX/VISTARIL) 25 MG tablet Take 25 mg by mouth every 6 (six) hours as needed (sleep).    . LORazepam (ATIVAN) 1 MG tablet Take 1 mg by mouth 3 (three) times daily.    . mirtazapine (REMERON) 30 MG tablet Take 15 mg by mouth at bedtime.    . Multiple Vitamins-Minerals (ICAPS AREDS 2) CAPS Take 1 capsule by mouth 2 (two) times daily. For eye care    . topiramate (TOPAMAX) 50 MG tablet Take 50 mg by mouth 2 (two) times daily.  0  . traZODone (DESYREL) 100 MG tablet Take 100-150 mg by mouth at bedtime as needed for sleep.    . clonazePAM (KLONOPIN) 1 MG tablet Take 1 tablet (1 mg total) by mouth 3 (three) times daily. For severe anxiety (Patient not taking: Reported on 02/03/2016) 21 tablet 0  . mirtazapine (REMERON) 7.5 MG tablet Take 1 tablet (7.5 mg) at bedtime as needed: For sleep (Patient not taking: Reported on 02/03/2016) 30 tablet 0    Musculoskeletal: Strength & Muscle Tone: within normal limits Gait & Station: normal Patient leans: N/A  Psychiatric Specialty Exam: Physical Exam  Constitutional: He is oriented to person, place, and time. He appears well-developed and well-nourished.  HENT:  Head: Normocephalic.  Neck: Normal range of motion.  Respiratory: Effort normal.  Musculoskeletal: Normal range of motion.  Neurological: He is alert and oriented to person, place, and time.  Skin: Skin is warm and dry.  Psychiatric: His behavior is normal. Judgment normal. His mood appears anxious. Cognition and memory  are normal. He exhibits a depressed mood. He expresses homicidal and suicidal ideation. He expresses suicidal plans.    Review of Systems  Constitutional: Negative.   HENT: Negative.   Eyes: Negative.   Respiratory: Negative.   Cardiovascular: Negative.   Gastrointestinal: Negative.   Genitourinary: Negative.   Musculoskeletal: Negative.   Skin: Negative.   Neurological: Negative.   Endo/Heme/Allergies: Negative.   Psychiatric/Behavioral: Positive for depression and suicidal ideas. The patient is nervous/anxious.     Blood pressure 127/73, pulse 78, temperature 97.8 F (36.6 C), temperature source Oral, resp. rate 17, SpO2 96 %.There is no weight on file to calculate BMI.  General Appearance: Casual  Eye Contact:  Fair  Speech:  Normal Rate  Volume:  Decreased  Mood:  Depressed, anxiety  Affect:  Congruent  Thought Process:  Coherent and Descriptions of Associations: Intact  Orientation:  Full (Time, Place, and Person)  Thought Content:  Obsessions and Rumination  Suicidal Thoughts:  Yes.  without intent/plan  Homicidal Thoughts:  Yes.  without intent/plan  Memory:  Immediate;   Fair Recent;   Fair Remote;   Fair  Judgement:  Impaired  Insight:  Fair  Psychomotor Activity:  Decreased  Concentration:  Concentration: Fair and Attention Span: Fair  Recall:  [employer] of Knowledge:  Fair  Language:  Good  Akathisia:  No  Handed:  Right  AIMS (if indicated):     Assets:  Housing Intimacy Leisure Time Resilience Social Support  ADL's:  Intact  Cognition:  WNL  Sleep:        Treatment Plan Summary: Daily contact with patient to assess and evaluate symptoms and progress in treatment, Medication management and Plan major depressive disorder, recurrent, severe without psychotic features:  -Crisis stabilization -Medication management:  Continue his medical medications and Prozac 60 mg daily for depression.  Change Trazodone 100-150 mg at bedtime for sleep decreased to 50  mg.  Start Geodon 20 mg for anxiety and OCD.  Gabapentin changed to 200 mg BID for anxiety.  Stop Remeron 15 mg at bedtime for sleep.  Change Ativan 1 mg TID to every six hours PRN anxiety.  Stop Saphris 10 mg BID for OCD and mood. -Individual counseling  Disposition: Recommend psychiatric Inpatient admission when medically cleared.  Waylan Boga, NP 02/04/2016 11:55 AM Patient seen face-to-face for psychiatric evaluation, chart reviewed and case discussed with the physician extender and developed treatment plan. Reviewed the information documented and agree with the treatment plan. Corena Pilgrim, MD

## 2016-02-04 NOTE — ED Notes (Signed)
Report called to [facility] 

## 2017-03-03 IMAGING — CR DG CHEST 2V
2 series · 2 of 2 positions shown · non-contrast
Comparison: None.

CLINICAL DATA: Palpitations

EXAM:
CHEST  2 VIEW

[w chest pa]
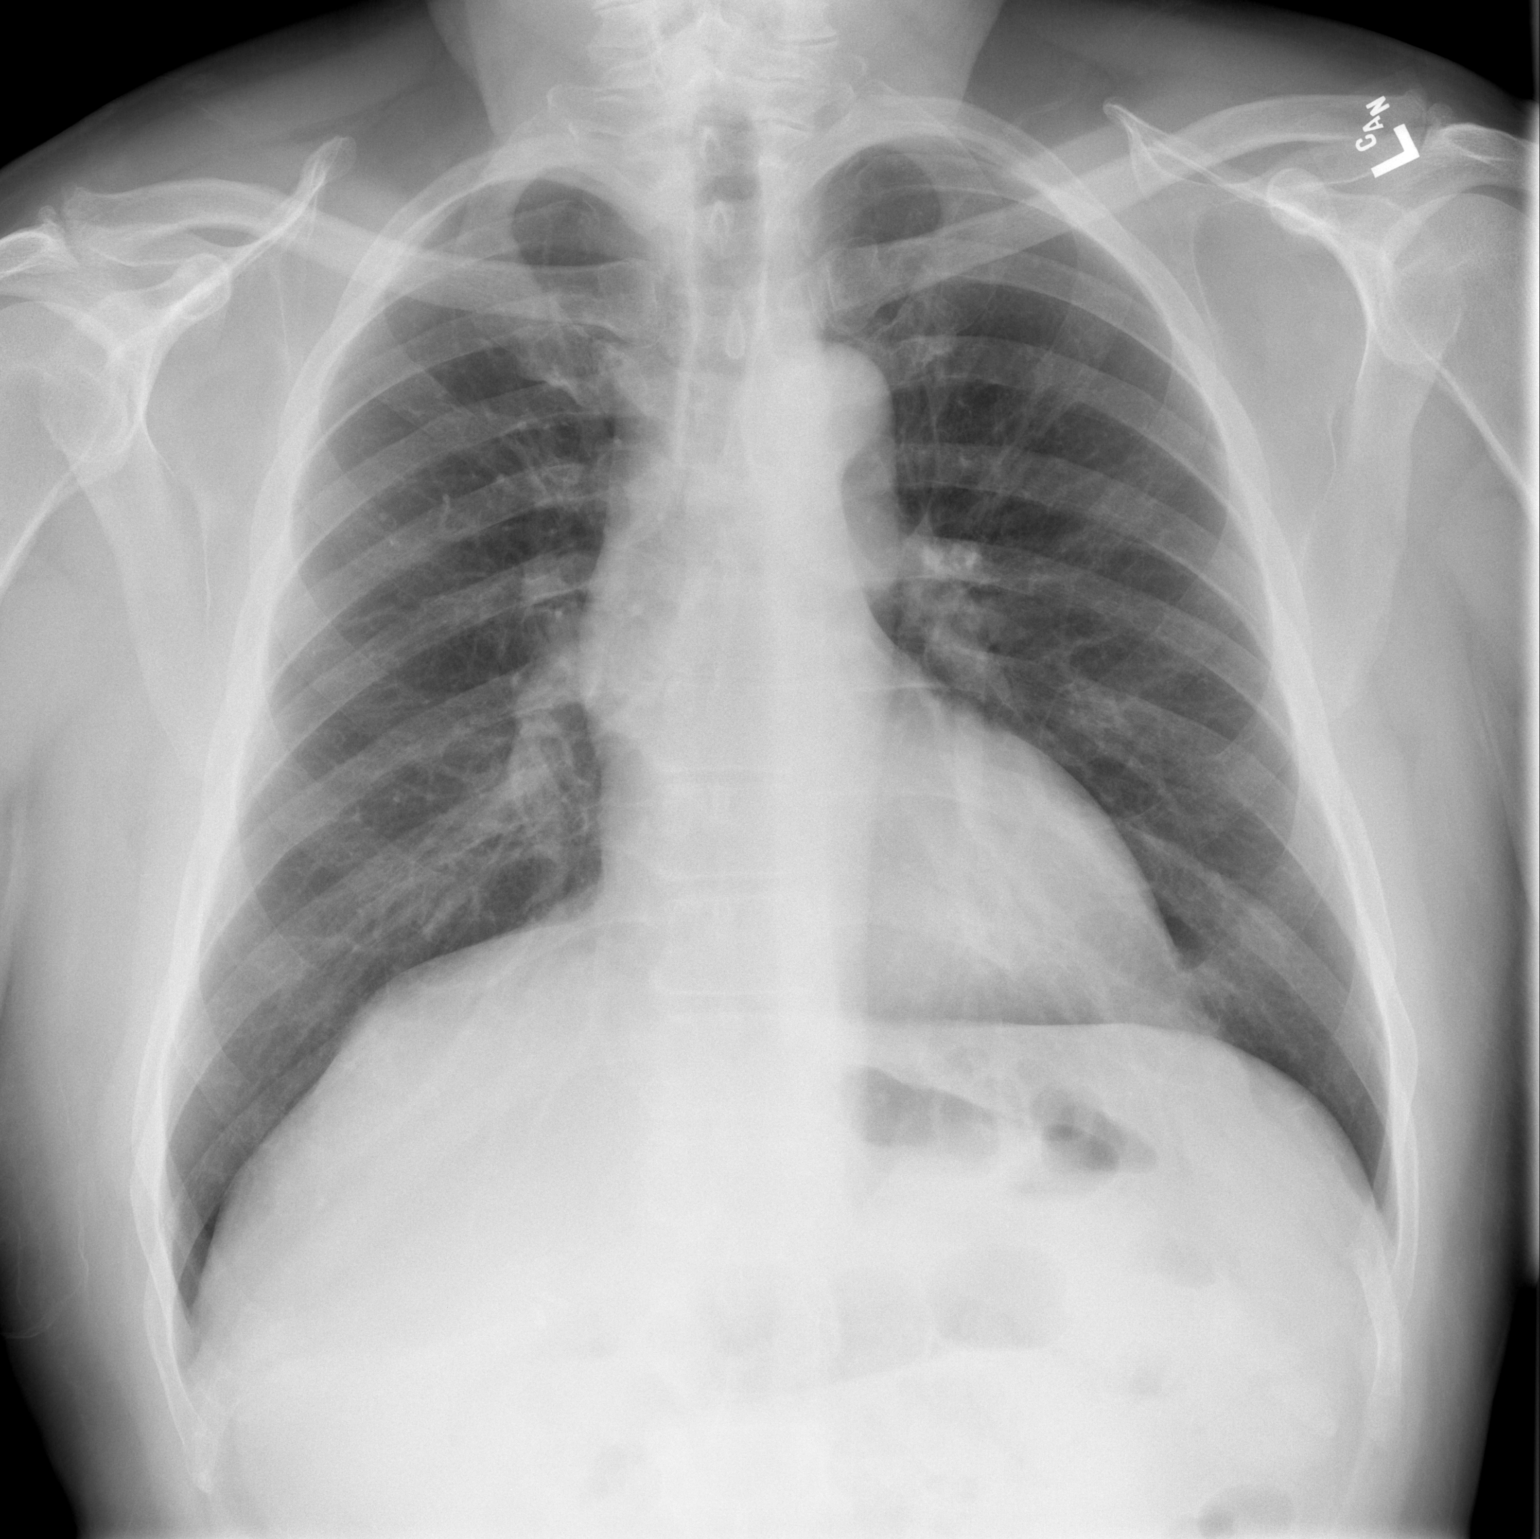

[w chest lat]
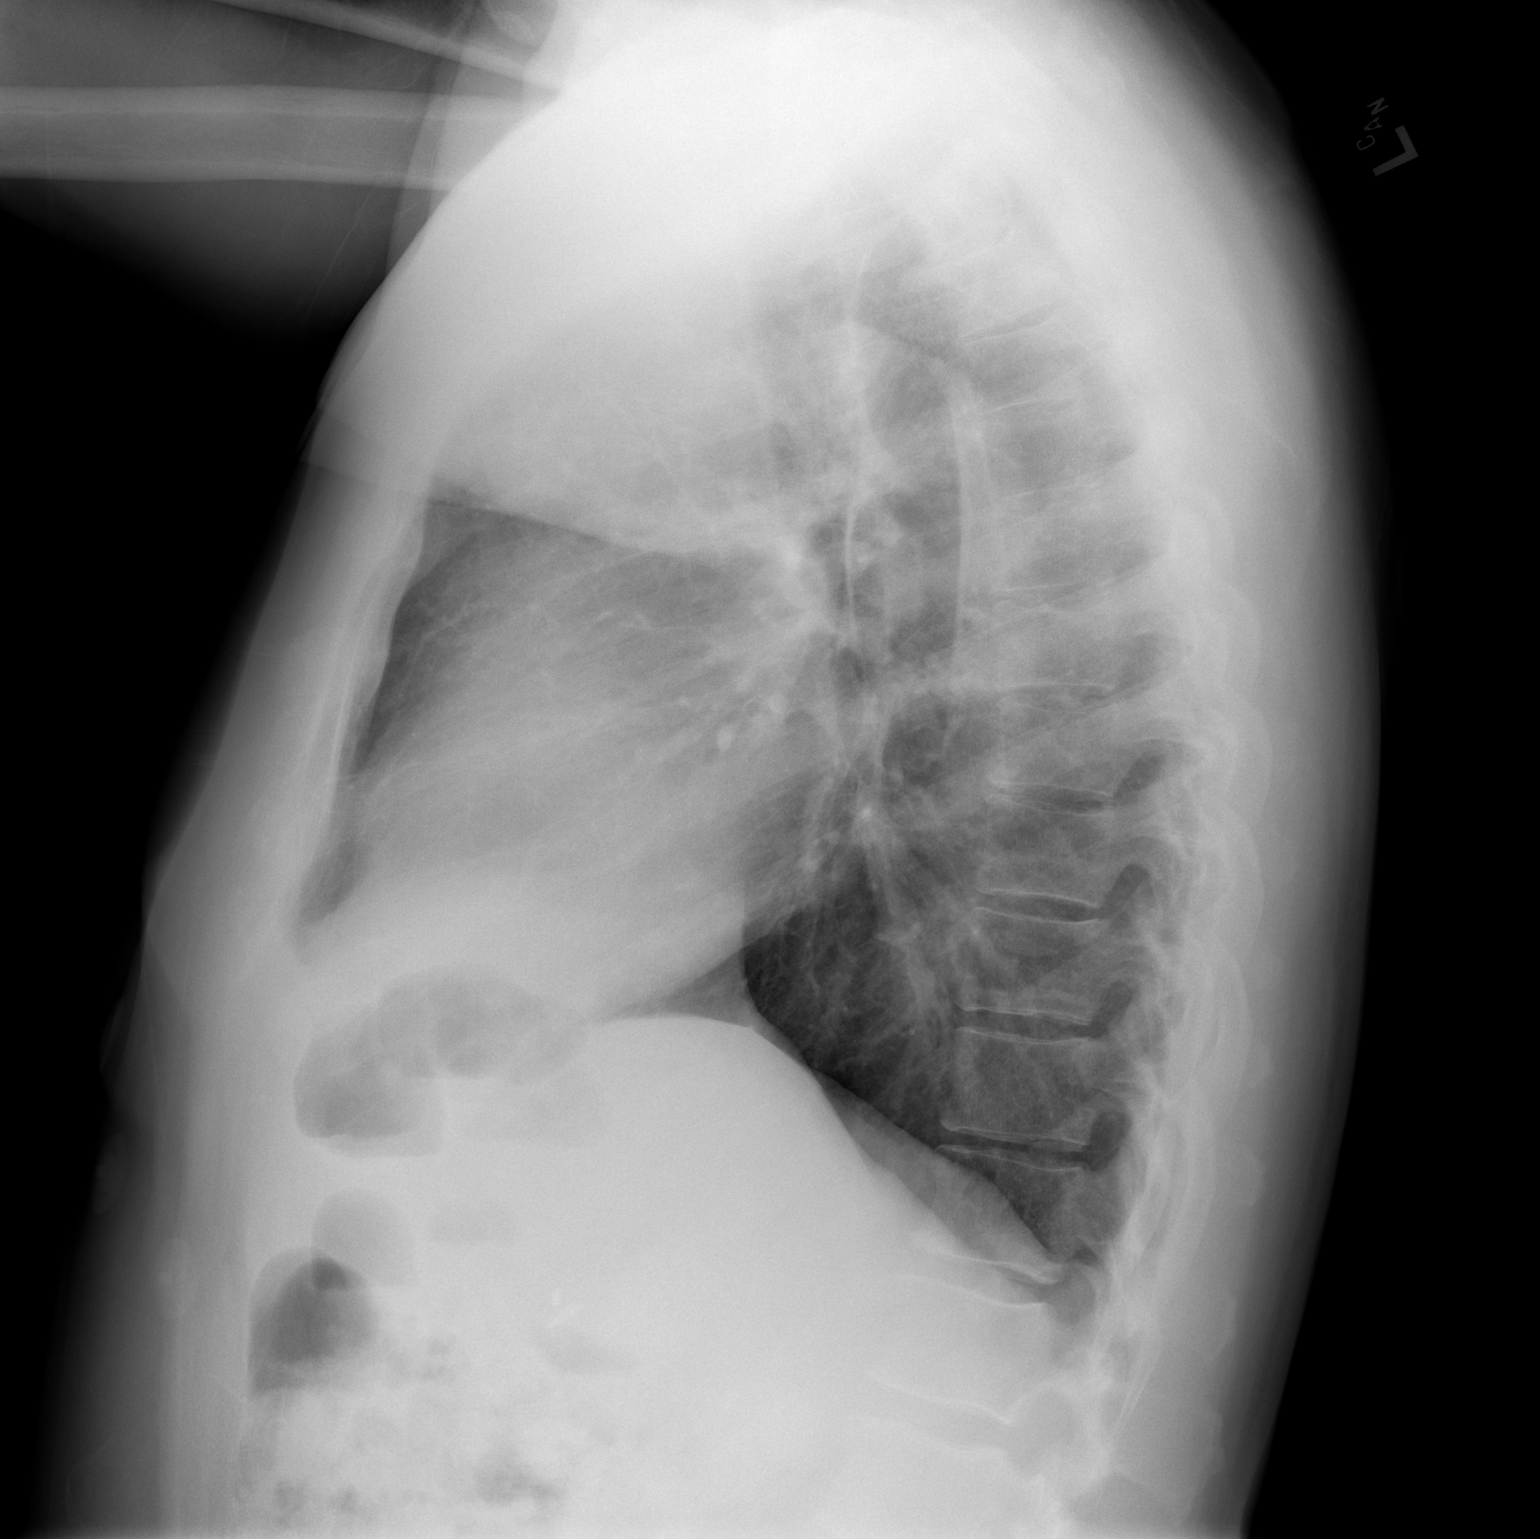

[2 of 2 positions shown; findings below may reference images not displayed]

FINDINGS: Normal heart size and mediastinal contours. No acute infiltrate or
edema. No effusion or pneumothorax. No acute osseous findings.
Cholecystectomy clips
IMPRESSION: No active cardiopulmonary disease.

## 2017-07-02 IMAGING — CR DG CHEST 2V
2 series · 2 of 2 positions shown · non-contrast
Comparison: 01/21/2016

CLINICAL DATA: 59-year-old male - respiratory examination for
[REDACTED].

EXAM:
CHEST  2 VIEW

[w chest pa]
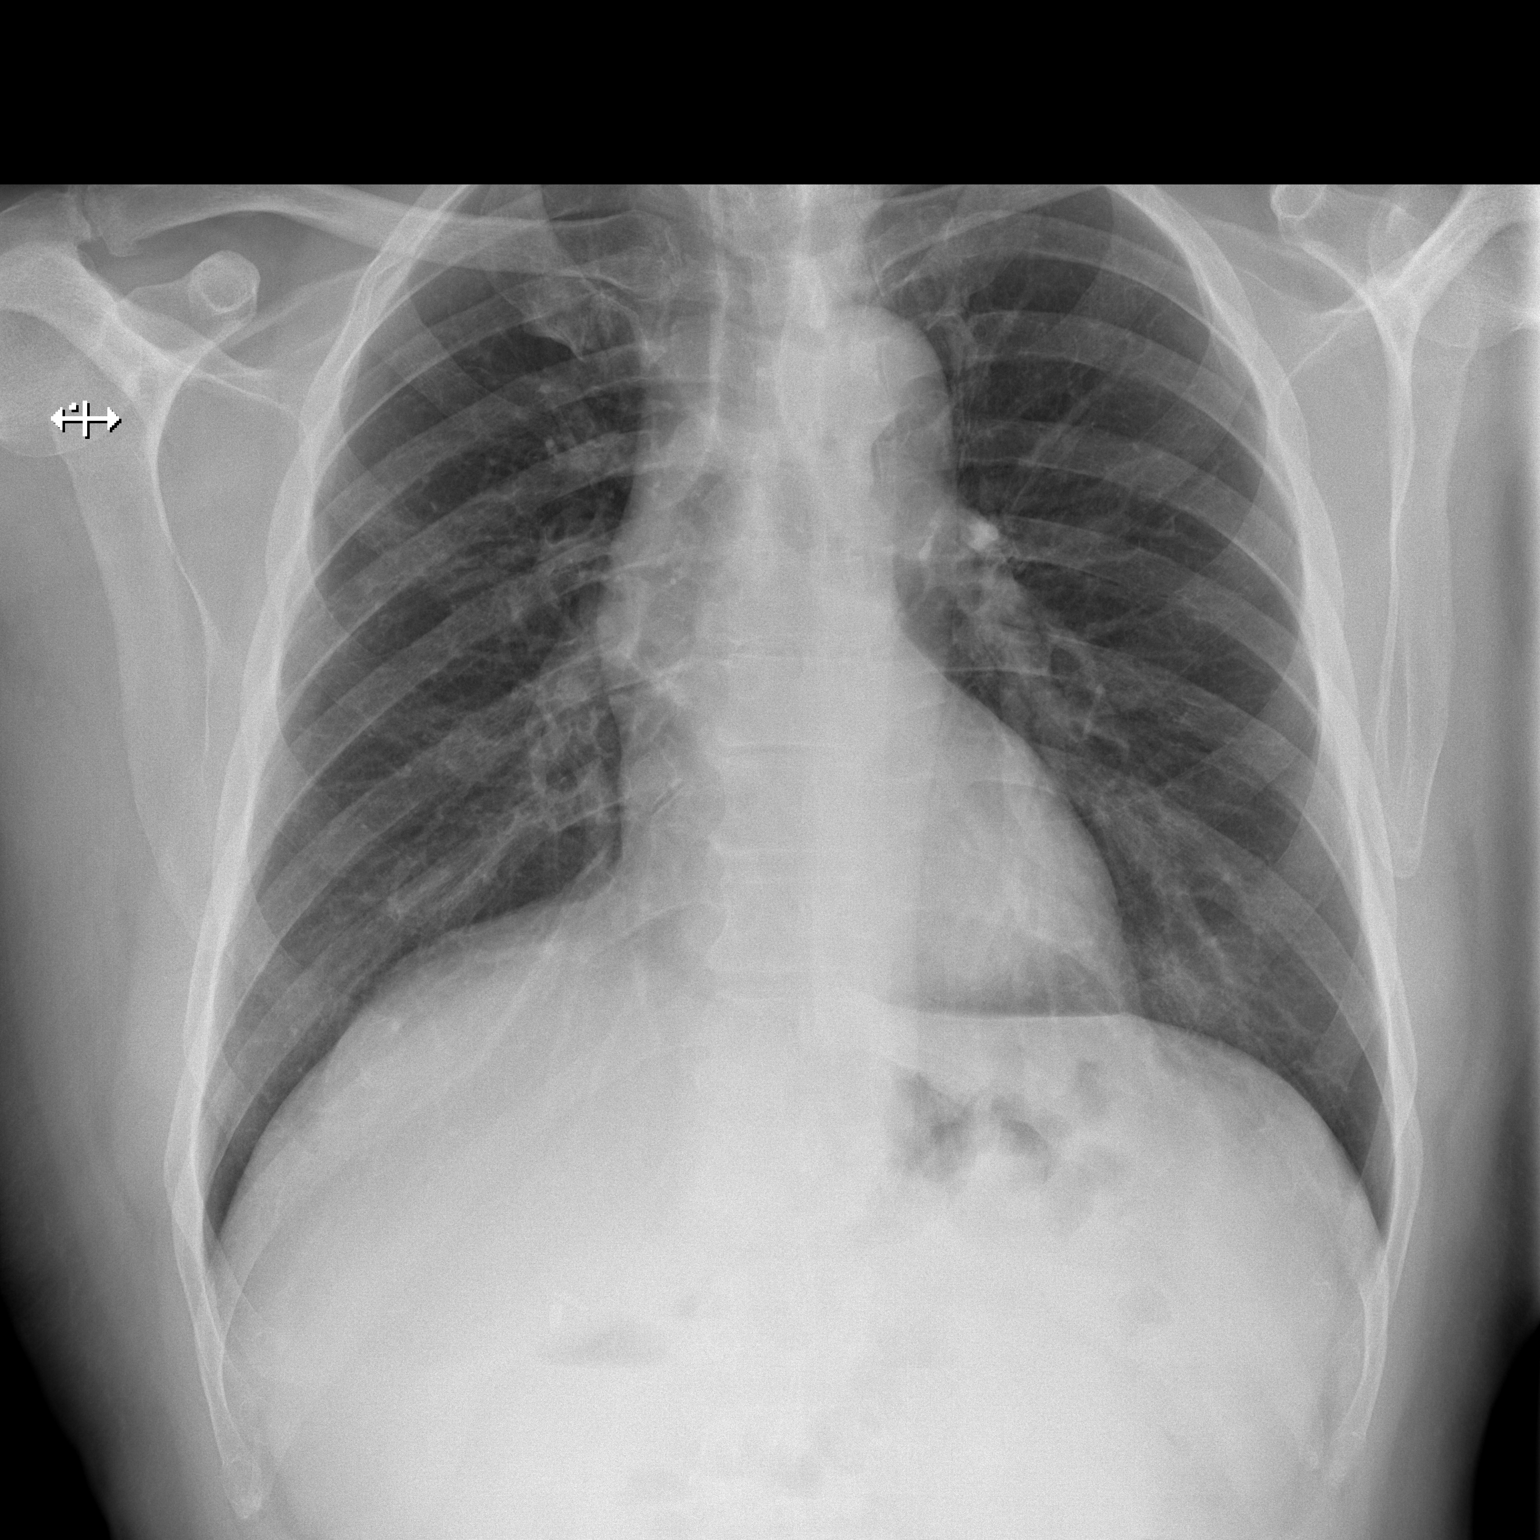

[w chest lat]
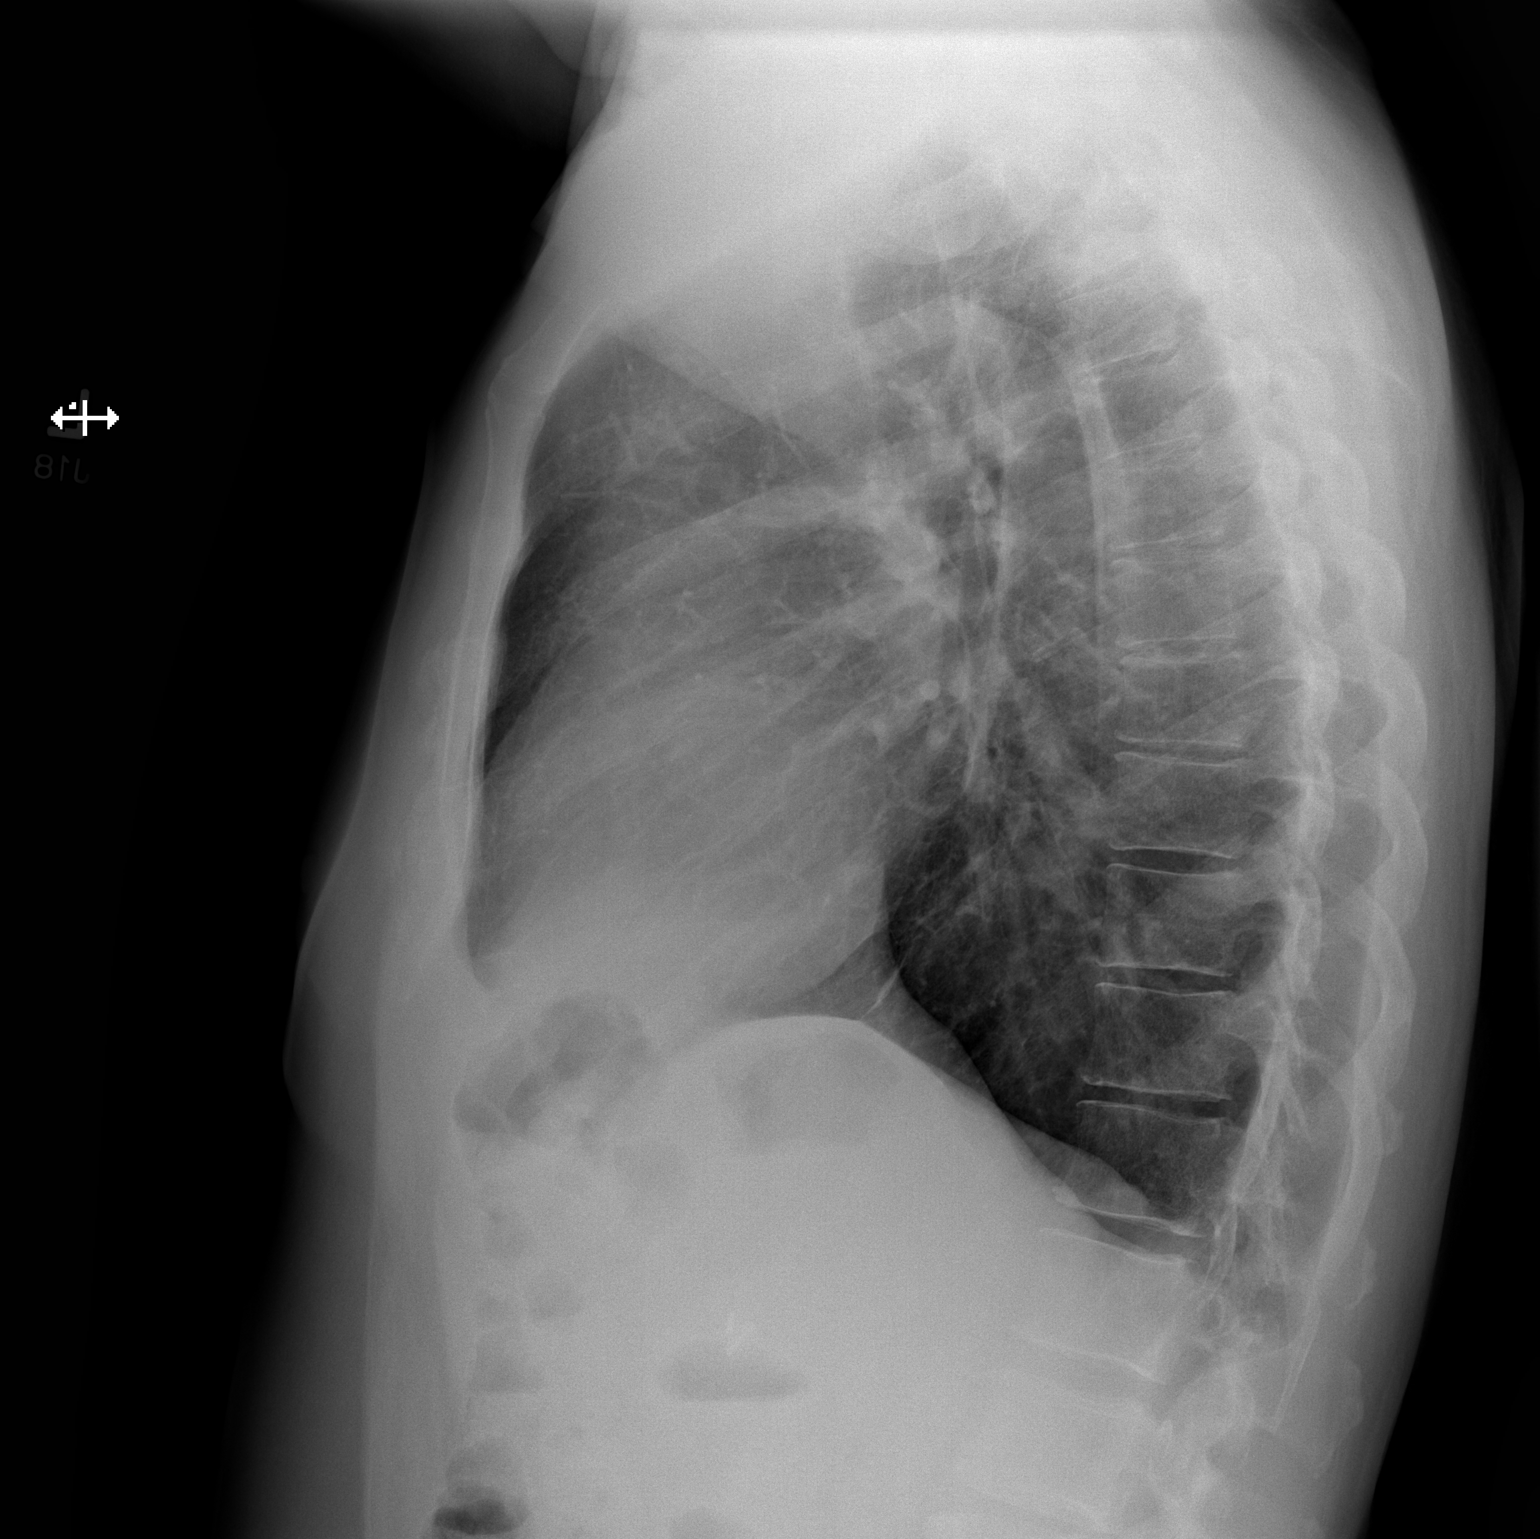

[2 of 2 positions shown; findings below may reference images not displayed]

FINDINGS: The cardiomediastinal silhouette is unremarkable.

There is no evidence of focal airspace disease, pulmonary edema,
suspicious pulmonary nodule/mass, pleural effusion, or pneumothorax.
No acute bony abnormalities are identified.
IMPRESSION: No active cardiopulmonary disease.

## 2018-06-07 ENCOUNTER — Other Ambulatory Visit: Payer: Self-pay

## 2018-06-07 MED ORDER — ZOLPIDEM TARTRATE 10 MG PO TABS: 10.0000 mg | ORAL_TABLET | Freq: Every evening | ORAL | 2 refills | Status: DC | PRN

## 2018-08-01 ENCOUNTER — Other Ambulatory Visit: Payer: Self-pay

## 2018-08-01 MED ORDER — ZOLPIDEM TARTRATE 10 MG PO TABS: 10.0000 mg | ORAL_TABLET | Freq: Every evening | ORAL | 1 refills | Status: DC | PRN

## 2018-08-01 MED ORDER — ZOLPIDEM TARTRATE 10 MG PO TABS: 10.0000 mg | ORAL_TABLET | Freq: Every evening | ORAL | 2 refills | Status: DC | PRN

## 2018-08-12 ENCOUNTER — Encounter: Payer: Self-pay | Admitting: Emergency Medicine

## 2018-08-12 DIAGNOSIS — G4700 Insomnia, unspecified: Secondary | ICD-10-CM | POA: Insufficient documentation

## 2018-08-12 DIAGNOSIS — F419 Anxiety disorder, unspecified: Secondary | ICD-10-CM | POA: Insufficient documentation

## 2018-08-22 ENCOUNTER — Encounter: Payer: Self-pay | Admitting: Psychiatry

## 2018-08-22 ENCOUNTER — Ambulatory Visit (INDEPENDENT_AMBULATORY_CARE_PROVIDER_SITE_OTHER): Payer: 59 | Admitting: Psychiatry

## 2018-08-22 DIAGNOSIS — F332 Major depressive disorder, recurrent severe without psychotic features: Secondary | ICD-10-CM

## 2018-08-22 DIAGNOSIS — F422 Mixed obsessional thoughts and acts: Secondary | ICD-10-CM

## 2018-08-22 DIAGNOSIS — F99 Mental disorder, not otherwise specified: Secondary | ICD-10-CM

## 2018-08-22 DIAGNOSIS — F5105 Insomnia due to other mental disorder: Secondary | ICD-10-CM | POA: Diagnosis not present

## 2018-08-22 DIAGNOSIS — G4733 Obstructive sleep apnea (adult) (pediatric): Secondary | ICD-10-CM

## 2018-08-22 DIAGNOSIS — F40298 Other specified phobia: Secondary | ICD-10-CM | POA: Diagnosis not present

## 2018-08-22 NOTE — Patient Instructions (Signed)
Stop gabapentin.  If any problems then call.

## 2018-08-22 NOTE — Progress Notes (Signed)
Joshua Eaton [id number] March 22, 1956 62 y.o.  Subjective:   Patient ID:  Charlis Eaton is a 62 y.o. (DOB 1955-09-16) male.  Chief Complaint:  Chief Complaint  Patient presents with  . Anxiety  . Sleeping Problem  . Medication Problem    weight    HPI [name] presents to the office today for follow-up of severe TR anxiety and OCD and med problems.  Still has chronic anxiety  But it's better than before the meds he's currently on.  Chronic fear of insomnia.  Appetite is very strong with the olanzapine.  Sleep is better with olanzapine but not great.  He is not depressed.  He is not having mood swings or unusual irritability or anger.  Still has some rigidity and is easily anxious and highly avoidant and poorly functional overall.  He can enjoy some things.  Last visit DC olanzapine but he restarted it helped his sleep.  Lost 5 #.  CO poor sleep and still having problems with the mask of CPAP.  Has nasal pillows recently.  Leaks some still.    Review of Systems:  Review of Systems  Constitutional: Positive for appetite change and unexpected weight change.  Neurological: Negative for tremors and weakness.  Psychiatric/Behavioral: Negative for agitation, behavioral problems, confusion, decreased concentration, dysphoric mood, hallucinations, self-injury, sleep disturbance and suicidal ideas. The patient is nervous/anxious. The patient is not hyperactive.     Medications: I have reviewed the patient's current medications.  Current Outpatient Medications  Medication Sig Dispense Refill  . LORazepam (ATIVAN) 1 MG tablet Take 1 mg by mouth 2 (two) times daily.     . Multiple Vitamins-Minerals (ICAPS AREDS 2) CAPS Take 1 capsule by mouth 2 (two) times daily. For eye care    . OLANZapine (ZYPREXA) 5 MG tablet Take 5 mg by mouth at bedtime.    . sertraline (ZOLOFT) 100 MG tablet Take 300 mg by mouth daily.     Marland Kitchen zolpidem (AMBIEN) 10 MG tablet Take 1 tablet (10 mg total) by mouth at bedtime as needed  for sleep. 30 tablet 2  . topiramate (TOPAMAX) 50 MG tablet Take 50 mg by mouth 2 (two) times daily.  0   No current facility-administered medications for this visit.     Medication Side Effects: Other: weight  Allergies:  Allergies  Allergen Reactions  . Codeine     "sends me into orbit"    Past Medical History:  Diagnosis Date  . Anxiety   . Cancer ([place])   . Depression   . Hypertension   . OCD (obsessive compulsive disorder)     History reviewed. No pertinent family history.  Social History   Socioeconomic History  . Marital status: Married    Spouse name: Not on file  . Number of children: Not on file  . Years of education: Not on file  . Highest education level: Not on file  Occupational History  . Not on file  Social Needs  . Financial resource strain: Not on file  . Food insecurity:    Worry: Not on file    Inability: Not on file  . Transportation needs:    Medical: Not on file    Non-medical: Not on file  Tobacco Use  . Smoking status: Never Smoker  . Smokeless tobacco: Never Used  Substance and Sexual Activity  . Alcohol use: No  . Drug use: No  . Sexual activity: Not on file  Lifestyle  . Physical activity:    Days  per week: Not on file    Minutes per session: Not on file  . Stress: Not on file  Relationships  . Social connections:    Talks on phone: Not on file    Gets together: Not on file    Attends religious service: Not on file    Active member of club or organization: Not on file    Attends meetings of clubs or organizations: Not on file    Relationship status: Not on file  . Intimate partner violence:    Fear of current or ex partner: Not on file    Emotionally abused: Not on file    Physically abused: Not on file    Forced sexual activity: Not on file  Other Topics Concern  . Not on file  Social History Narrative  . Not on file    Past Medical History, Surgical history, Social history, and Family history were reviewed and  updated as appropriate.   Please see review of systems for further details on the patient's review from today.   Objective:   Physical Exam:  There were no vitals taken for this visit.  Physical Exam Constitutional:      Appearance: He is obese.  Neurological:     Mental Status: He is alert.     Sensory: No sensory deficit.     Gait: Gait normal.  Psychiatric:        Attention and Perception: Attention and perception normal.        Mood and Affect: Mood is anxious. Mood is not depressed.        Speech: Speech normal.        Behavior: Behavior is cooperative.        Thought Content: Thought content is not paranoid. Thought content does not include homicidal or suicidal ideation.        Cognition and Memory: Cognition normal.     Comments: Chronic anxiety. Fair insight and judgment. Chronic OCD still better than before Zoloft.     Lab Review:     Component Value Date/Time   NA 138 02/03/2016 1708   K 3.5 02/03/2016 1708   CL 110 02/03/2016 1708   CO2 19 (L) 02/03/2016 1708   GLUCOSE 212 (H) 02/03/2016 1708   BUN 21 (H) 02/03/2016 1708   CREATININE 1.02 02/03/2016 1708   CALCIUM 8.9 02/03/2016 1708   PROT 6.6 02/03/2016 1708   ALBUMIN 3.8 02/03/2016 1708   AST 23 02/03/2016 1708   ALT 24 02/03/2016 1708   ALKPHOS 49 02/03/2016 1708   BILITOT 0.6 02/03/2016 1708   GFRNONAA >60 02/03/2016 1708   GFRAA >60 02/03/2016 1708       Component Value Date/Time   WBC 10.1 02/03/2016 1708   RBC 5.11 02/03/2016 1708   HGB 16.3 02/03/2016 1708   HCT 45.4 02/03/2016 1708   PLT 138 (L) 02/03/2016 1708   MCV 88.8 02/03/2016 1708   MCH 31.9 02/03/2016 1708   MCHC 35.9 02/03/2016 1708   RDW 13.1 02/03/2016 1708   LYMPHSABS 2.1 10/06/2015 0500   MONOABS 1.1 (H) 10/06/2015 0500   EOSABS 0.1 10/06/2015 0500   BASOSABS 0.1 10/06/2015 0500    No results found for: POCLITH, LITHIUM   No results found for: PHENYTOIN, PHENOBARB, VALPROATE, CBMZ   .res Assessment: Plan:     Mixed obsessional thoughts and acts  Insomnia due to other mental disorder  Severe recurrent major depression without psychotic features ([place])  Specific phobia  Obstructive sleep apnea  Disc CPAP use and ways to improve mask sealing.  Disc importance of compliance for brain health.  Disc high dose sertraline and article supporting high dosage for TR OCD.  Disc risk serotonin syndrome.  Disc SSRI WD as cause for dizziness.  Discussed potential metabolic side effects associated with atypical antipsychotics, as well as potential risk for movement side effects. Advised pt to contact office if movement side effects occur.   We discussed the short-term risks associated with benzodiazepines including sedation and increased fall risk among others.  Discussed long-term side effect risk including dependence, potential withdrawal symptoms, and the potential eventual dose-related risk of dementia.  Consider DC gabapentin bc balance issues  This appt was 30 mins.  FU 3 mos  Lynder Parents, MD, DFAPA    Please see After Visit Summary for patient specific instructions.  No future appointments.  No orders of the defined types were placed in this encounter.     -------------------------------

## 2018-08-26 ENCOUNTER — Other Ambulatory Visit: Payer: Self-pay

## 2018-08-26 MED ORDER — OLANZAPINE 5 MG PO TABS: 5.0000 mg | ORAL_TABLET | Freq: Every day | ORAL | 2 refills | Status: DC

## 2018-09-24 ENCOUNTER — Other Ambulatory Visit: Payer: Self-pay | Admitting: Psychiatry

## 2018-09-25 NOTE — Telephone Encounter (Signed)
Clarify dose discrepancy

## 2018-09-26 ENCOUNTER — Other Ambulatory Visit: Payer: Self-pay

## 2018-09-26 MED ORDER — LORAZEPAM 1 MG PO TABS: 1.0000 mg | ORAL_TABLET | Freq: Two times a day (BID) | ORAL | 2 refills | Status: DC

## 2018-09-26 NOTE — Telephone Encounter (Signed)
Please clarify, paper chart showing gabapentin 100 mg tid, refill request is 300mg  tid?

## 2018-09-29 ENCOUNTER — Other Ambulatory Visit: Payer: Self-pay

## 2018-09-29 MED ORDER — SERTRALINE HCL 100 MG PO TABS: ORAL_TABLET | ORAL | 1 refills | Status: DC

## 2018-09-29 NOTE — Telephone Encounter (Signed)
Have you review this yet?

## 2018-10-13 ENCOUNTER — Other Ambulatory Visit: Payer: Self-pay | Admitting: Psychiatry

## 2018-10-18 ENCOUNTER — Other Ambulatory Visit: Payer: Self-pay

## 2018-10-18 NOTE — Progress Notes (Signed)
Gabapentin Rx failed to transmit on 10/14/2018 to [facility] pharmacy in [place], Per last office visit on 08/22/2018 pt to discontinue.

## 2018-10-31 ENCOUNTER — Other Ambulatory Visit: Payer: Self-pay

## 2018-10-31 MED ORDER — ZOLPIDEM TARTRATE 10 MG PO TABS: 10.0000 mg | ORAL_TABLET | Freq: Every evening | ORAL | 2 refills | Status: DC | PRN

## 2018-11-20 ENCOUNTER — Other Ambulatory Visit: Payer: Self-pay | Admitting: Psychiatry

## 2018-11-25 ENCOUNTER — Encounter: Payer: Self-pay | Admitting: Psychiatry

## 2018-11-25 ENCOUNTER — Ambulatory Visit (INDEPENDENT_AMBULATORY_CARE_PROVIDER_SITE_OTHER): Payer: 59 | Admitting: Psychiatry

## 2018-11-25 ENCOUNTER — Other Ambulatory Visit: Payer: Self-pay

## 2018-11-25 DIAGNOSIS — F40298 Other specified phobia: Secondary | ICD-10-CM

## 2018-11-25 DIAGNOSIS — G4733 Obstructive sleep apnea (adult) (pediatric): Secondary | ICD-10-CM

## 2018-11-25 DIAGNOSIS — F332 Major depressive disorder, recurrent severe without psychotic features: Secondary | ICD-10-CM

## 2018-11-25 DIAGNOSIS — F422 Mixed obsessional thoughts and acts: Secondary | ICD-10-CM

## 2018-11-25 DIAGNOSIS — F99 Mental disorder, not otherwise specified: Secondary | ICD-10-CM

## 2018-11-25 DIAGNOSIS — F5105 Insomnia due to other mental disorder: Secondary | ICD-10-CM

## 2018-11-25 MED ORDER — ZOLPIDEM TARTRATE 10 MG PO TABS: 10.0000 mg | ORAL_TABLET | Freq: Every day | ORAL | 5 refills | Status: DC

## 2018-11-25 NOTE — Progress Notes (Signed)
Joshua Eaton [id number] October 07, 1955 63 y.o.  Subjective:   Patient ID:  Joshua Eaton is a 63 y.o. (DOB [birth year]) male.  Chief Complaint:  Chief Complaint  Patient presents with  . Anxiety  . Sleeping Problem  . Depression    HPI TC with patient and his wife. She administers meds. 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    Last visit August 22, 2018.   Good and bad days. Bad days doesn't want to do things.  Can't find much activity.  Bored and not doing much.  Anxiety 7-8/10 with 10 good.  Depression is rated the same as anxiety which is all much improved.  Still has chronic anxiety  But it's better than before the meds he's currently on.  Chronic fear of insomnia.  Appetite is very strong with the olanzapine.  Sleep is better with olanzapine but not great.  He is not depressed.  He is not having mood swings or unusual irritability or anger.  Still has some rigidity and is easily anxious and highly avoidant and poorly functional overall.  He can enjoy some things.  Problems with CPAP.  Sleep terrible.  Only 3-4 hours of sleep.  Using [facility].  Doesn't think it's humidiyfing.  Has not talked to them about it yet.    CO poor sleep and still having problems with the mask of CPAP.  Has nasal pillows recently.  Leaks some still.    Stopped gabapentin with no problems.  Asked about reduction in Zoloft.   Weight down from 287 to 266#.  Review of Systems:  Review of Systems  Constitutional: Positive for appetite change and unexpected weight change.  Neurological: Negative for tremors and weakness.  Psychiatric/Behavioral: Negative for agitation, behavioral problems, confusion, decreased concentration, dysphoric mood, hallucinations, self-injury, sleep disturbance and suicidal ideas. The patient is nervous/anxious. The patient is not hyperactive.     Medications: I have reviewed the patient's current  medications.  Current Outpatient Medications  Medication Sig Dispense Refill  . LORazepam (ATIVAN) 1 MG tablet Take 1 tablet (1 mg total) by mouth 2 (two) times daily. 60 tablet 2  . Multiple Vitamins-Minerals (ICAPS AREDS 2) CAPS Take 1 capsule by mouth 2 (two) times daily. For eye care    . OLANZapine (ZYPREXA) 5 MG tablet TAKE 1 TABLET BY MOUTH EVERYDAY AT BEDTIME 30 tablet 2  . sertraline (ZOLOFT) 100 MG tablet Take 2 tablets by mouth in the morning and 1 tablet in the PM 270 tablet 1  . TRIAMTERENE PO Take 25 mg by mouth daily.    Marland Kitchen zolpidem (AMBIEN) 10 MG tablet Take 1 tablet (10 mg total) by mouth at bedtime for 30 days. 30 tablet 5   No current facility-administered medications for this visit.     Medication Side Effects: Other: weight  Allergies:  Allergies  Allergen Reactions  . Codeine     "sends me into orbit"    Past Medical History:  Diagnosis Date  . Anxiety   . Cancer ([place])   . Depression   . Hypertension   . OCD (obsessive compulsive disorder)     History reviewed. No pertinent family history.  Social History   Socioeconomic History  . Marital status: Married    Spouse name: Not on file  . Number of children: Not on file  . Years of education: Not on file  . Highest education level: Not on file  Occupational History  .  Not on file  Social Needs  . Financial resource strain: Not on file  . Food insecurity:    Worry: Not on file    Inability: Not on file  . Transportation needs:    Medical: Not on file    Non-medical: Not on file  Tobacco Use  . Smoking status: Never Smoker  . Smokeless tobacco: Never Used  Substance and Sexual Activity  . Alcohol use: No  . Drug use: No  . Sexual activity: Not on file  Lifestyle  . Physical activity:    Days per week: Not on file    Minutes per session: Not on file  . Stress: Not on file  Relationships  . Social connections:    Talks on phone: Not on file    Gets together: Not on file    Attends  religious service: Not on file    Active member of club or organization: Not on file    Attends meetings of clubs or organizations: Not on file    Relationship status: Not on file  . Intimate partner violence:    Fear of current or ex partner: Not on file    Emotionally abused: Not on file    Physically abused: Not on file    Forced sexual activity: Not on file  Other Topics Concern  . Not on file  Social History Narrative  . Not on file    Past Medical History, Surgical history, Social history, and Family history were reviewed and updated as appropriate.   Please see review of systems for further details on the patient's review from today.   Objective:   Physical Exam:  There were no vitals taken for this visit.  Physical Exam Constitutional:      Appearance: He is obese.  Neurological:     Mental Status: He is alert.     Sensory: No sensory deficit.     Gait: Gait normal.  Psychiatric:        Attention and Perception: Attention and perception normal.        Mood and Affect: Mood is anxious. Mood is not depressed.        Speech: Speech normal.        Behavior: Behavior is cooperative.        Thought Content: Thought content is not paranoid. Thought content does not include homicidal or suicidal ideation.        Cognition and Memory: Cognition normal.     Comments: Chronic anxiety. Fair insight and judgment. Chronic OCD still better than before Zoloft.     Lab Review:     Component Value Date/Time   NA 138 02/03/2016 1708   K 3.5 02/03/2016 1708   CL 110 02/03/2016 1708   CO2 19 (L) 02/03/2016 1708   GLUCOSE 212 (H) 02/03/2016 1708   BUN 21 (H) 02/03/2016 1708   CREATININE 1.02 02/03/2016 1708   CALCIUM 8.9 02/03/2016 1708   PROT 6.6 02/03/2016 1708   ALBUMIN 3.8 02/03/2016 1708   AST 23 02/03/2016 1708   ALT 24 02/03/2016 1708   ALKPHOS 49 02/03/2016 1708   BILITOT 0.6 02/03/2016 1708   GFRNONAA >60 02/03/2016 1708   GFRAA >60 02/03/2016 1708        Component Value Date/Time   WBC 10.1 02/03/2016 1708   RBC 5.11 02/03/2016 1708   HGB 16.3 02/03/2016 1708   HCT 45.4 02/03/2016 1708   PLT 138 (L) 02/03/2016 1708   MCV 88.8 02/03/2016 1708  MCH 31.9 02/03/2016 1708   MCHC 35.9 02/03/2016 1708   RDW 13.1 02/03/2016 1708   LYMPHSABS 2.1 10/06/2015 0500   MONOABS 1.1 (H) 10/06/2015 0500   EOSABS 0.1 10/06/2015 0500   BASOSABS 0.1 10/06/2015 0500    No results found for: POCLITH, LITHIUM   No results found for: PHENYTOIN, PHENOBARB, VALPROATE, CBMZ   .res Assessment: Plan:    Mixed obsessional thoughts and acts  Insomnia due to other mental disorder  Obstructive sleep apnea  Severe recurrent major depression without psychotic features (HCC)  Specific phobia   Disc CPAP use and ways to improve mask sealing.  Disc importance of compliance for brain health.  He needs to contact [facility] and make an appt with them about it.  Sleep poor bc of it.  Disc high dose sertraline and article supporting high dosage for TR OCD.  Disc risk serotonin syndrome.  Disc SSRI WD as cause for dizziness.  Advise against reduction from the high dosage because it is clearly helped his anxiety and depression.  His anxiety and depression are the best it has been in years..  Discussed potential metabolic side effects associated with atypical antipsychotics, as well as potential risk for movement side effects. Advised pt to contact office if movement side effects occur.   We discussed the short-term risks associated with benzodiazepines including sedation and increased fall risk among others.  Discussed long-term side effect risk including dependence, potential withdrawal symptoms, and the potential eventual dose-related risk of dementia.  stopped gabapentin bc balance issues wtihout worsening anxiety.  No med changes today  This appt was 30 mins.  I connected with patient by a video enabled telemedicine application or telephone, with  their informed consent, and verified patient privacy and that I am speaking with the correct person using two identifiers.  I was located at office and patient at home.  FU 3 mos  Lynder Parents, MD, DFAPA    Please see After Visit Summary for patient specific instructions.  No future appointments.  No orders of the defined types were placed in this encounter.     -------------------------------

## 2018-12-13 ENCOUNTER — Other Ambulatory Visit: Payer: Self-pay

## 2018-12-13 MED ORDER — LORAZEPAM 1 MG PO TABS: 1.0000 mg | ORAL_TABLET | Freq: Two times a day (BID) | ORAL | 2 refills | Status: DC

## 2018-12-14 ENCOUNTER — Other Ambulatory Visit: Payer: Self-pay | Admitting: Psychiatry

## 2019-02-20 ENCOUNTER — Ambulatory Visit (INDEPENDENT_AMBULATORY_CARE_PROVIDER_SITE_OTHER): Payer: 59 | Admitting: Psychiatry

## 2019-02-20 ENCOUNTER — Other Ambulatory Visit: Payer: Self-pay

## 2019-02-20 ENCOUNTER — Encounter: Payer: Self-pay | Admitting: Psychiatry

## 2019-02-20 DIAGNOSIS — F5105 Insomnia due to other mental disorder: Secondary | ICD-10-CM

## 2019-02-20 DIAGNOSIS — G4733 Obstructive sleep apnea (adult) (pediatric): Secondary | ICD-10-CM

## 2019-02-20 DIAGNOSIS — F99 Mental disorder, not otherwise specified: Secondary | ICD-10-CM

## 2019-02-20 DIAGNOSIS — F40298 Other specified phobia: Secondary | ICD-10-CM | POA: Diagnosis not present

## 2019-02-20 DIAGNOSIS — F422 Mixed obsessional thoughts and acts: Secondary | ICD-10-CM | POA: Diagnosis not present

## 2019-02-20 DIAGNOSIS — F4001 Agoraphobia with panic disorder: Secondary | ICD-10-CM

## 2019-02-20 MED ORDER — LORAZEPAM 1 MG PO TABS: 1.0000 mg | ORAL_TABLET | Freq: Two times a day (BID) | ORAL | 3 refills | Status: DC

## 2019-02-20 MED ORDER — SERTRALINE HCL 100 MG PO TABS: ORAL_TABLET | ORAL | 1 refills | Status: DC

## 2019-02-20 MED ORDER — ZOLPIDEM TARTRATE 10 MG PO TABS: 10.0000 mg | ORAL_TABLET | Freq: Every day | ORAL | 3 refills | Status: DC

## 2019-02-20 MED ORDER — OLANZAPINE 5 MG PO TABS: 5.0000 mg | ORAL_TABLET | Freq: Every day | ORAL | 1 refills | Status: DC

## 2019-02-20 NOTE — Progress Notes (Addendum)
Joshua Eaton [id number] 1955-12-01 63 y.o.  Subjective:   Patient ID:  Joshua Eaton is a 63 y.o. (DOB 1955-11-10) male.  Chief Complaint:  Chief Complaint  Patient presents with  . Anxiety    med mangement  . Sleeping Problem    HPI Seen alone bc W''s mother died lately. 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    Last seen November 25, 2018.  No med changes were made.  M in law died at their house.  Lived there 12 years.  Nice lady.  Some sort of funeral  For her this weekend.  Concerned insurance won't be as good when turns 63 yo.  Disc concerns about this.  Still Good and bad days. Bad days doesn't want to do things.  Can't find much activity.  Bored and not doing much.  Anxiety 7-8/10 with 10 good.  Depression is rated the same as anxiety which is all much improved.  Still has chronic anxiety  But it's better than before the meds he's currently on.  Chronic fear of insomnia.  Appetite is very strong with the olanzapine.  Sleep is better with olanzapine but not great.  He is not depressed.  He is not having mood swings or unusual irritability or anger.  Still has some rigidity and is easily anxious and highly avoidant and poorly functional overall.  He can enjoy some things.  Problems with CPAP.  Sleep not great as usual but sleeps with TV in bedroom.    Only 3-4 hours of sleep.  Using [facility].  Doesn't think it's humidiyfing.  Has not talked to them about it yet.    CO poor sleep and still having problems with the mask of CPAP.  Has nasal pillows recently.  Leaks some still.    Stopped gabapentin with no problems.  Asked about reduction in Zoloft.   Weight down from 287 to 266#.  Past Psychiatric Medication Trials:  Olanzapine, Xanax, lorazepam, gabapentin, Ambien, Geodon, fluvoxamine, sertraline 300, mirtazapine, fluoxetine 40, temazepam, risperidone 0.25, quetiapine 25, topiramate 50, trazodone  Review of  Systems:  Review of Systems  Constitutional: Positive for appetite change and unexpected weight change.  Neurological: Negative for tremors and weakness.  Psychiatric/Behavioral: Negative for agitation, behavioral problems, confusion, decreased concentration, dysphoric mood, hallucinations, self-injury, sleep disturbance and suicidal ideas. The patient is nervous/anxious. The patient is not hyperactive.     Medications: I have reviewed the patient's current medications.  Current Outpatient Medications  Medication Sig Dispense Refill  . LORazepam (ATIVAN) 1 MG tablet Take 1 tablet (1 mg total) by mouth 2 (two) times daily. 60 tablet 2  . Multiple Vitamins-Minerals (ICAPS AREDS 2) CAPS Take 1 capsule by mouth 2 (two) times daily. For eye care    . OLANZapine (ZYPREXA) 5 MG tablet TAKE 1 TABLET BY MOUTH EVERYDAY AT BEDTIME 90 tablet 0  . sertraline (ZOLOFT) 100 MG tablet Take 2 tablets by mouth in the morning and 1 tablet in the PM 270 tablet 1  . TRIAMTERENE PO Take 25 mg by mouth daily.    Marland Kitchen zolpidem (AMBIEN) 10 MG tablet Take 1 tablet (10 mg total) by mouth at bedtime for 30 days. 30 tablet 5   No current facility-administered medications for this visit.     Medication Side Effects: Other: weight  Allergies:  Allergies  Allergen Reactions  . Codeine     "sends me into orbit"    Past  Medical History:  Diagnosis Date  . Anxiety   . Cancer ([place])   . Depression   . Hypertension   . OCD (obsessive compulsive disorder)     History reviewed. No pertinent family history.  Social History   Socioeconomic History  . Marital status: Married    Spouse name: Not on file  . Number of children: Not on file  . Years of education: Not on file  . Highest education level: Not on file  Occupational History  . Not on file  Social Needs  . Financial resource strain: Not on file  . Food insecurity    Worry: Not on file    Inability: Not on file  . Transportation needs    Medical: Not  on file    Non-medical: Not on file  Tobacco Use  . Smoking status: Never Smoker  . Smokeless tobacco: Never Used  Substance and Sexual Activity  . Alcohol use: No  . Drug use: No  . Sexual activity: Not on file  Lifestyle  . Physical activity    Days per week: Not on file    Minutes per session: Not on file  . Stress: Not on file  Relationships  . Social Herbalist on phone: Not on file    Gets together: Not on file    Attends religious service: Not on file    Active member of club or organization: Not on file    Attends meetings of clubs or organizations: Not on file    Relationship status: Not on file  . Intimate partner violence    Fear of current or ex partner: Not on file    Emotionally abused: Not on file    Physically abused: Not on file    Forced sexual activity: Not on file  Other Topics Concern  . Not on file  Social History Narrative  . Not on file    Past Medical History, Surgical history, Social history, and Family history were reviewed and updated as appropriate.   Please see review of systems for further details on the patient's review from today.   Objective:   Physical Exam:  There were no vitals taken for this visit.  Physical Exam Constitutional:      Appearance: He is obese.  Neurological:     Mental Status: He is alert.     Sensory: No sensory deficit.     Gait: Gait normal.  Psychiatric:        Attention and Perception: Attention and perception normal.        Mood and Affect: Mood is anxious. Mood is not depressed.        Speech: Speech normal.        Behavior: Behavior is cooperative.        Thought Content: Thought content is not paranoid. Thought content does not include homicidal or suicidal ideation.        Cognition and Memory: Cognition normal.     Comments: Chronic anxiety. Fair insight and judgment. Chronic OCD still better than before Zoloft.     Lab Review:     Component Value Date/Time   NA 138 02/03/2016  1708   K 3.5 02/03/2016 1708   CL 110 02/03/2016 1708   CO2 19 (L) 02/03/2016 1708   GLUCOSE 212 (H) 02/03/2016 1708   BUN 21 (H) 02/03/2016 1708   CREATININE 1.02 02/03/2016 1708   CALCIUM 8.9 02/03/2016 1708   PROT 6.6 02/03/2016 1708   ALBUMIN  3.8 02/03/2016 1708   AST 23 02/03/2016 1708   ALT 24 02/03/2016 1708   ALKPHOS 49 02/03/2016 1708   BILITOT 0.6 02/03/2016 1708   GFRNONAA >60 02/03/2016 1708   GFRAA >60 02/03/2016 1708       Component Value Date/Time   WBC 10.1 02/03/2016 1708   RBC 5.11 02/03/2016 1708   HGB 16.3 02/03/2016 1708   HCT 45.4 02/03/2016 1708   PLT 138 (L) 02/03/2016 1708   MCV 88.8 02/03/2016 1708   MCH 31.9 02/03/2016 1708   MCHC 35.9 02/03/2016 1708   RDW 13.1 02/03/2016 1708   LYMPHSABS 2.1 10/06/2015 0500   MONOABS 1.1 (H) 10/06/2015 0500   EOSABS 0.1 10/06/2015 0500   BASOSABS 0.1 10/06/2015 0500    No results found for: POCLITH, LITHIUM   No results found for: PHENYTOIN, PHENOBARB, VALPROATE, CBMZ   .res Assessment: Plan:    Joshua Eaton was seen today for anxiety and sleeping problem.  Diagnoses and all orders for this visit:  Mixed obsessional thoughts and acts  Insomnia due to other mental disorder  Obstructive sleep apnea  Specific phobia  Panic disorder with agoraphobia   Disc CPAP use and ways to improve mask sealing.  Disc importance of compliance for brain health.  He needs to contact [facility] and make an appt with them about it.  Sleep poor bc of it.  Sleep hygiene poor with TV on and explained to him.  He's unlikely to change.  Disc high dose sertraline and article supporting high dosage for TR OCD.  Disc risk serotonin syndrome.  Disc SSRI WD as cause for dizziness.  Advise against reduction from the high dosage because it is clearly helped his anxiety and depression.  His anxiety and depression are the best it has been in years..  Discussed potential metabolic side effects associated with atypical  antipsychotics, as well as potential risk for movement side effects. Advised pt to contact office if movement side effects occur. Getting benefit from olanzapine.  We discussed the short-term risks associated with benzodiazepines including sedation and increased fall risk among others.  Discussed long-term side effect risk including dependence, potential withdrawal symptoms, and the potential eventual dose-related risk of dementia.  Disc good RX witih med changes.  No med changes today  This appt was 30 mins.  FU 4 mos  Lynder Parents, MD, DFAPA    Please see After Visit Summary for patient specific instructions.  No future appointments.  No orders of the defined types were placed in this encounter.     -------------------------------

## 2019-04-21 ENCOUNTER — Other Ambulatory Visit: Payer: Self-pay | Admitting: Cardiology

## 2019-04-21 NOTE — Telephone Encounter (Signed)
°*  STAT* If patient is at the pharmacy, call can be transferred to refill team.   1. Which medications need to be refilled? (please list name of each medication and dose if known) Triamterene hctz 37.5-25mg  tablet  2. Which pharmacy/location (including street and city if local pharmacy) is medication to be sent to?[id number]  3. Do they need a 30 day or 90 day supply? [place]

## 2019-04-24 NOTE — Telephone Encounter (Signed)
Called patient's pharmacy, [facility]. I have advised that the patient is over due for a follow up so we would not be able to do his refill.

## 2019-06-21 ENCOUNTER — Encounter: Payer: Self-pay | Admitting: Psychiatry

## 2019-06-21 ENCOUNTER — Other Ambulatory Visit: Payer: Self-pay

## 2019-06-21 ENCOUNTER — Ambulatory Visit (INDEPENDENT_AMBULATORY_CARE_PROVIDER_SITE_OTHER): Payer: PPO | Admitting: Psychiatry

## 2019-06-21 VITALS — Wt 262.0 lb

## 2019-06-21 DIAGNOSIS — F332 Major depressive disorder, recurrent severe without psychotic features: Secondary | ICD-10-CM

## 2019-06-21 DIAGNOSIS — F40298 Other specified phobia: Secondary | ICD-10-CM

## 2019-06-21 DIAGNOSIS — G4733 Obstructive sleep apnea (adult) (pediatric): Secondary | ICD-10-CM

## 2019-06-21 DIAGNOSIS — F4001 Agoraphobia with panic disorder: Secondary | ICD-10-CM

## 2019-06-21 DIAGNOSIS — F422 Mixed obsessional thoughts and acts: Secondary | ICD-10-CM | POA: Diagnosis not present

## 2019-06-21 DIAGNOSIS — F5105 Insomnia due to other mental disorder: Secondary | ICD-10-CM

## 2019-06-21 DIAGNOSIS — F99 Mental disorder, not otherwise specified: Secondary | ICD-10-CM | POA: Diagnosis not present

## 2019-06-21 MED ORDER — LORAZEPAM 1 MG PO TABS: 1.0000 mg | ORAL_TABLET | Freq: Two times a day (BID) | ORAL | 3 refills | Status: DC

## 2019-06-21 MED ORDER — OLANZAPINE 5 MG PO TABS: 5.0000 mg | ORAL_TABLET | Freq: Every day | ORAL | 1 refills | Status: DC

## 2019-06-21 MED ORDER — SERTRALINE HCL 100 MG PO TABS: ORAL_TABLET | ORAL | 1 refills | Status: DC

## 2019-06-21 NOTE — Progress Notes (Signed)
Joshua Eaton [id number] May 26, 1956 63 y.o.  Subjective:   Patient ID:  Joshua Eaton is a 63 y.o. (DOB [birth year]) male.  Chief Complaint:  Chief Complaint  Patient presents with  . Anxiety  . Depression  . Follow-up    med managemet    HPI Seen alone  Joshua Eaton presents to the office today for follow-up of severe TR anxiety and OCD and med problems.    Last seen June 22 , 2020.  No med changes were made.  M in law died at their house in 03/11/23.  Lived there 12 years.  Still dealing with it.  Only time he was with someone who died.  Wife Joshua Eaton still grieving.   Usually feeling OK but this week not feeling all present, "like I'm outside of myself".  Bothered some there have been a lot of friends to die lately.  Thinks his anxiety is up some.  Never should have started watching news.  Disc Covid concerns too.  Still Good and bad days. Bad days doesn't want to do things.  Can't find much activity.  Bored and not doing much.  Anxiety 7-8/10 with 10 good.  Depression is rated the same as anxiety which is all much improved.  No problems with the meds.  Still has chronic anxiety  But it's better than before the meds he's currently on.  Chronic fear of insomnia.  Appetite is very strong with the olanzapine.  Sleep is variable.  Last night good but bad last week.  He is not depressed.  He is not having mood swings or unusual irritability or anger.  Still has some rigidity and is easily anxious and highly avoidant and poorly functional overall.  He can enjoy some things.  Tries to stay active.  Going to [employer].  Worry over the country.  Chronic Problems with CPAP.  Sleep not great as usual but sleeps with TV in bedroom.    Only 3-4 hours of sleep.  Using [place].  Doesn't think it's humidiyfing.  Has not talked to them about it yet.    CO poor sleep and still having problems with the mask of CPAP.  Has nasal pillows recently.  Leaks some still.    Stopped gabapentin with no  problems.  Weight down from 287 to 262#.  No sex in 67 mos bc difficulty.  Wonders about meds affecting.    Past Psychiatric Medication Trials:  Olanzapine, Xanax, lorazepam, gabapentin, Ambien, Geodon, fluvoxamine, sertraline 300, mirtazapine, fluoxetine 40, temazepam, risperidone 0.25, quetiapine 25, topiramate 50, trazodone  Review of Systems:  Review of Systems  Constitutional: Positive for appetite change and unexpected weight change.  Neurological: Negative for tremors and weakness.  Psychiatric/Behavioral: Negative for agitation, behavioral problems, confusion, decreased concentration, dysphoric mood, hallucinations, self-injury, sleep disturbance and suicidal ideas. The patient is nervous/anxious. The patient is not hyperactive.     Medications: I have reviewed the patient's current medications.  Current Outpatient Medications  Medication Sig Dispense Refill  . LORazepam (ATIVAN) 1 MG tablet Take 1 tablet (1 mg total) by mouth 2 (two) times daily. 60 tablet 3  . Multiple Vitamins-Minerals (ICAPS AREDS 2) CAPS Take 1 capsule by mouth 2 (two) times daily. For eye care    . OLANZapine (ZYPREXA) 5 MG tablet Take 1 tablet (5 mg total) by mouth at bedtime. 90 tablet 1  . sertraline (ZOLOFT) 100 MG tablet Take 2 tablets by mouth in the morning and 1 tablet in the PM 270  tablet 1  . TRIAMTERENE PO Take 25 mg by mouth daily.    Marland Kitchen zolpidem (AMBIEN) 10 MG tablet Take 1 tablet (10 mg total) by mouth at bedtime for 30 days. 30 tablet 3   No current facility-administered medications for this visit.     Medication Side Effects: Other: weight  Allergies:  Allergies  Allergen Reactions  . Codeine     "sends me into orbit"    Past Medical History:  Diagnosis Date  . Anxiety   . Cancer ([place])   . Depression   . Hypertension   . OCD (obsessive compulsive disorder)     History reviewed. No pertinent family history.  Social History   Socioeconomic History  . Marital status:  Married    Spouse name: Not on file  . Number of children: Not on file  . Years of education: Not on file  . Highest education level: Not on file  Occupational History  . Not on file  Social Needs  . Financial resource strain: Not on file  . Food insecurity    Worry: Not on file    Inability: Not on file  . Transportation needs    Medical: Not on file    Non-medical: Not on file  Tobacco Use  . Smoking status: Never Smoker  . Smokeless tobacco: Never Used  Substance and Sexual Activity  . Alcohol use: No  . Drug use: No  . Sexual activity: Not on file  Lifestyle  . Physical activity    Days per week: Not on file    Minutes per session: Not on file  . Stress: Not on file  Relationships  . Social Herbalist on phone: Not on file    Gets together: Not on file    Attends religious service: Not on file    Active member of club or organization: Not on file    Attends meetings of clubs or organizations: Not on file    Relationship status: Not on file  . Intimate partner violence    Fear of current or ex partner: Not on file    Emotionally abused: Not on file    Physically abused: Not on file    Forced sexual activity: Not on file  Other Topics Concern  . Not on file  Social History Narrative  . Not on file    Past Medical History, Surgical history, Social history, and Family history were reviewed and updated as appropriate.   Please see review of systems for further details on the patient's review from today.   Objective:   Physical Exam:  Wt 262 lb (118.8 kg)   BMI 37.86 kg/m   Physical Exam Constitutional:      Appearance: He is obese.  Neurological:     Mental Status: He is alert.     Sensory: No sensory deficit.     Gait: Gait normal.  Psychiatric:        Attention and Perception: Attention and perception normal.        Mood and Affect: Mood is anxious. Mood is not depressed.        Speech: Speech normal.        Behavior: Behavior is  cooperative.        Thought Content: Thought content is not paranoid. Thought content does not include homicidal or suicidal ideation.        Cognition and Memory: Cognition normal.     Comments: Chronic anxiety. Fair insight and judgment.  Chronic OCD still better than before Zoloft. Talkative per usual.     Lab Review:     Component Value Date/Time   NA 138 02/03/2016 1708   K 3.5 02/03/2016 1708   CL 110 02/03/2016 1708   CO2 19 (L) 02/03/2016 1708   GLUCOSE 212 (H) 02/03/2016 1708   BUN 21 (H) 02/03/2016 1708   CREATININE 1.02 02/03/2016 1708   CALCIUM 8.9 02/03/2016 1708   PROT 6.6 02/03/2016 1708   ALBUMIN 3.8 02/03/2016 1708   AST 23 02/03/2016 1708   ALT 24 02/03/2016 1708   ALKPHOS 49 02/03/2016 1708   BILITOT 0.6 02/03/2016 1708   GFRNONAA >60 02/03/2016 1708   GFRAA >60 02/03/2016 1708       Component Value Date/Time   WBC 10.1 02/03/2016 1708   RBC 5.11 02/03/2016 1708   HGB 16.3 02/03/2016 1708   HCT 45.4 02/03/2016 1708   PLT 138 (L) 02/03/2016 1708   MCV 88.8 02/03/2016 1708   MCH 31.9 02/03/2016 1708   MCHC 35.9 02/03/2016 1708   RDW 13.1 02/03/2016 1708   LYMPHSABS 2.1 10/06/2015 0500   MONOABS 1.1 (H) 10/06/2015 0500   EOSABS 0.1 10/06/2015 0500   BASOSABS 0.1 10/06/2015 0500    No results found for: POCLITH, LITHIUM   No results found for: PHENYTOIN, PHENOBARB, VALPROATE, CBMZ   .res Assessment: Plan:    Joshua Eaton was seen today for anxiety, depression and follow-up.  Diagnoses and all orders for this visit:  Mixed obsessional thoughts and acts -     LORazepam (ATIVAN) 1 MG tablet; Take 1 tablet (1 mg total) by mouth 2 (two) times daily. -     OLANZapine (ZYPREXA) 5 MG tablet; Take 1 tablet (5 mg total) by mouth at bedtime. -     sertraline (ZOLOFT) 100 MG tablet; Take 2 tablets by mouth in the morning and 1 tablet in the PM  Insomnia due to other mental disorder  Panic disorder with agoraphobia  Severe recurrent major depression  without psychotic features ([place])  Obstructive sleep apnea  Specific phobia  SSRI sexual dysfunction  Greater than 50% of 30 min  face to face time with patient was spent on counseling and coordination of care. We discussed the following: Disc CPAP use and ways to improve mask sealing.  Disc importance of compliance for brain health.  He needs to contact [place] [facility] and make an appt with them about it.  Sleep poor bc of it.  Sleep hygiene poor with TV on and explained to him.  He's unlikely to change.  Disc high dose sertraline and article supporting high dosage for TR OCD.  Disc risk serotonin syndrome.  Disc SSRI WD as cause for dizziness.  Advise against reduction from the high dosage because it is clearly helped his anxiety and depression.  His anxiety and depression are the best it has been in years.. Disc sexual SE likely from sertraline.  Option Viagra.  Disc pricing.  Says Dr. Arelia Sneddon gave him a sample without help.  Disc drug holiday.  Discussed potential metabolic side effects associated with atypical antipsychotics, as well as potential risk for movement side effects. Advised pt to contact office if movement side effects occur. Getting benefit from olanzapine markedly and he agrees.  We discussed the short-term risks associated with benzodiazepines including sedation and increased fall risk among others.  Discussed long-term side effect risk including dependence, potential withdrawal symptoms, and the potential eventual dose-related risk of dementia.  Encourage walking and  weight loss.  Disc good RX witih med changes.  No med changes today  This appt was 30 mins.  FU 4 mos  Lynder Parents, MD, DFAPA    Please see After Visit Summary for patient specific instructions.  No future appointments.  No orders of the defined types were placed in this encounter.     -------------------------------

## 2019-06-23 DIAGNOSIS — G4733 Obstructive sleep apnea (adult) (pediatric): Secondary | ICD-10-CM | POA: Diagnosis not present

## 2019-07-08 ENCOUNTER — Other Ambulatory Visit: Payer: Self-pay | Admitting: Psychiatry

## 2019-07-08 DIAGNOSIS — F422 Mixed obsessional thoughts and acts: Secondary | ICD-10-CM

## 2019-07-09 NOTE — Telephone Encounter (Signed)
NEXT apt 02/23

## 2019-08-26 ENCOUNTER — Other Ambulatory Visit: Payer: Self-pay | Admitting: Psychiatry

## 2019-08-26 DIAGNOSIS — F5105 Insomnia due to other mental disorder: Secondary | ICD-10-CM

## 2019-08-31 DIAGNOSIS — G4733 Obstructive sleep apnea (adult) (pediatric): Secondary | ICD-10-CM | POA: Diagnosis not present

## 2019-10-05 DIAGNOSIS — U071 COVID-19: Secondary | ICD-10-CM | POA: Diagnosis not present

## 2019-10-05 DIAGNOSIS — Z20828 Contact with and (suspected) exposure to other viral communicable diseases: Secondary | ICD-10-CM | POA: Diagnosis not present

## 2019-10-06 DIAGNOSIS — K5732 Diverticulitis of large intestine without perforation or abscess without bleeding: Secondary | ICD-10-CM | POA: Diagnosis not present

## 2019-10-24 ENCOUNTER — Ambulatory Visit (INDEPENDENT_AMBULATORY_CARE_PROVIDER_SITE_OTHER): Payer: PPO | Admitting: Psychiatry

## 2019-10-24 ENCOUNTER — Other Ambulatory Visit: Payer: Self-pay

## 2019-10-24 ENCOUNTER — Encounter: Payer: Self-pay | Admitting: Psychiatry

## 2019-10-24 DIAGNOSIS — F3341 Major depressive disorder, recurrent, in partial remission: Secondary | ICD-10-CM

## 2019-10-24 DIAGNOSIS — F99 Mental disorder, not otherwise specified: Secondary | ICD-10-CM | POA: Diagnosis not present

## 2019-10-24 DIAGNOSIS — F4001 Agoraphobia with panic disorder: Secondary | ICD-10-CM | POA: Diagnosis not present

## 2019-10-24 DIAGNOSIS — G4733 Obstructive sleep apnea (adult) (pediatric): Secondary | ICD-10-CM | POA: Diagnosis not present

## 2019-10-24 DIAGNOSIS — F422 Mixed obsessional thoughts and acts: Secondary | ICD-10-CM | POA: Diagnosis not present

## 2019-10-24 DIAGNOSIS — F5105 Insomnia due to other mental disorder: Secondary | ICD-10-CM

## 2019-10-24 MED ORDER — LORAZEPAM 1 MG PO TABS: 1.0000 mg | ORAL_TABLET | Freq: Two times a day (BID) | ORAL | 5 refills | Status: DC

## 2019-10-24 NOTE — Progress Notes (Signed)
[name] [id number] [birth year] 64 y.o.  Subjective:   Patient ID:  Joshua Eaton is a 64 y.o. (DOB 08-Mar-1956) male.  Chief Complaint:  Chief Complaint  Patient presents with  . Anxiety  . Follow-up    med managment    HPI Seen with wife Joshua Eaton presents to the office today for follow-up of severe TR anxiety and OCD and med problems.    Last seen October, 2020.  No med changes were made.   No Covid.  Still worries about it.  Has days where he feels that he doesn't exist.  Will be unmotivated but not nervous.    Enjoys cat.    M in law died at their house in 18-Mar-2019.  Lived there 12 years.  Still dealing with it.  Only time he was with someone who died.  Wife Joshua Eaton still grieving.   Usually feeling OK but this week not feeling all present, "like I'm outside of myself".  Bothered some there have been a lot of friends to die lately.  Thinks his anxiety is up some.  Never should have started watching news.  Disc Covid concerns too.  Still Good and bad days. Bad days doesn't want to do things.  Can't find much activity.  Bored and not doing much.  Anxiety 7-8/10 with 10 good.  Depression is rated the same as anxiety which is all much improved.  No problems with the meds. Except is forgetful per wife.    Still has chronic anxiety  But it's better than before the meds he's currently on.  Chronic fear of insomnia.  Appetite is very strong with the olanzapine.  Sleep is variable.  Last night good but bad last week.  He is not depressed.  He is not having mood swings or unusual irritability or anger.  Still has some rigidity and is easily anxious and highly avoidant and poorly functional overall.  He can enjoy some things.  Tries to stay active.  Going to [employer].  Worry over the country.  Chronic Problems with CPAP.  Sleep not great as usual but sleeps with TV in bedroom.    Only 3-4 hours of sleep.  Using [place].  Doesn't think it's humidiyfing.  Has not talked  to them about it yet.    CO poor sleep and still having problems with the mask of CPAP.  Has nasal pillows recently.  Leaks some still.    Stopped gabapentin with no problems.  Weight down from 287 to 262#.  No sex in 22 mos bc difficulty.  Wonders about meds affecting.    Past Psychiatric Medication Trials:  Olanzapine, Xanax, lorazepam, gabapentin, Ambien, Geodon, fluvoxamine, sertraline 300, mirtazapine, fluoxetine 40, temazepam, risperidone 0.25, quetiapine 25, topiramate 50, trazodone  Review of Systems:  Review of Systems  Constitutional: Positive for appetite change and unexpected weight change.  Eyes: Positive for visual disturbance.  Neurological: Negative for tremors and weakness.  Psychiatric/Behavioral: Negative for agitation, behavioral problems, confusion, decreased concentration, dysphoric mood, hallucinations, self-injury, sleep disturbance and suicidal ideas. The patient is nervous/anxious. The patient is not hyperactive.     Medications: I have reviewed the patient's current medications.  Current Outpatient Medications  Medication Sig Dispense Refill  . LORazepam (ATIVAN) 1 MG tablet Take 1 tablet (1 mg total) by mouth 2 (two) times daily. 60 tablet 5  . Multiple Vitamins-Minerals (ICAPS AREDS 2) CAPS Take 1 capsule by mouth 2 (two) times daily. For eye care    .  OLANZapine (ZYPREXA) 5 MG tablet Take 1 tablet (5 mg total) by mouth at bedtime. 90 tablet 1  . sertraline (ZOLOFT) 100 MG tablet Take 2 tablets by mouth in the morning and 1 tablet in the PM 270 tablet 1  . TRIAMTERENE PO Take 25 mg by mouth daily.    Marland Kitchen zolpidem (AMBIEN) 10 MG tablet TAKE 1 TABLET BY MOUTH AT BEDTIME FOR 30 DAYS. 30 tablet 2   No current facility-administered medications for this visit.    Medication Side Effects: Other: weight  Allergies:  Allergies  Allergen Reactions  . Codeine     "sends me into orbit"    Past Medical History:  Diagnosis Date  . Anxiety   . Cancer ([place])   .  Depression   . Hypertension   . OCD (obsessive compulsive disorder)     History reviewed. No pertinent family history.  Social History   Socioeconomic History  . Marital status: Married    Spouse name: Not on file  . Number of children: Not on file  . Years of education: Not on file  . Highest education level: Not on file  Occupational History  . Not on file  Tobacco Use  . Smoking status: Never Smoker  . Smokeless tobacco: Never Used  Substance and Sexual Activity  . Alcohol use: No  . Drug use: No  . Sexual activity: Not on file  Other Topics Concern  . Not on file  Social History Narrative  . Not on file   Social Determinants of Health   Financial Resource Strain:   . Difficulty of Paying Living Expenses: Not on file  Food Insecurity:   . Worried About Charity fundraiser in the Last Year: Not on file  . Ran Out of Food in the Last Year: Not on file  Transportation Needs:   . Lack of Transportation (Medical): Not on file  . Lack of Transportation (Non-Medical): Not on file  Physical Activity:   . Days of Exercise per Week: Not on file  . Minutes of Exercise per Session: Not on file  Stress:   . Feeling of Stress : Not on file  Social Connections:   . Frequency of Communication with Friends and Family: Not on file  . Frequency of Social Gatherings with Friends and Family: Not on file  . Attends Religious Services: Not on file  . Active Member of Clubs or Organizations: Not on file  . Attends Archivist Meetings: Not on file  . Marital Status: Not on file  Intimate Partner Violence:   . Fear of Current or Ex-Partner: Not on file  . Emotionally Abused: Not on file  . Physically Abused: Not on file  . Sexually Abused: Not on file    Past Medical History, Surgical history, Social history, and Family history were reviewed and updated as appropriate.   Please see review of systems for further details on the patient's review from today.   Objective:    Physical Exam:  There were no vitals taken for this visit.  Physical Exam Constitutional:      Appearance: He is obese.  Neurological:     Mental Status: He is alert.     Sensory: No sensory deficit.     Gait: Gait normal.  Psychiatric:        Attention and Perception: Attention and perception normal.        Mood and Affect: Mood is anxious. Mood is not depressed.  Speech: Speech normal.        Behavior: Behavior is cooperative.        Thought Content: Thought content is not paranoid. Thought content does not include homicidal or suicidal ideation.        Cognition and Memory: Cognition normal.     Comments: Chronic anxiety but better with meds. Fair insight and judgment. Chronic OCD still better than before Zoloft. Talkative per usual.     Lab Review:     Component Value Date/Time   NA 138 02/03/2016 1708   K 3.5 02/03/2016 1708   CL 110 02/03/2016 1708   CO2 19 (L) 02/03/2016 1708   GLUCOSE 212 (H) 02/03/2016 1708   BUN 21 (H) 02/03/2016 1708   CREATININE 1.02 02/03/2016 1708   CALCIUM 8.9 02/03/2016 1708   PROT 6.6 02/03/2016 1708   ALBUMIN 3.8 02/03/2016 1708   AST 23 02/03/2016 1708   ALT 24 02/03/2016 1708   ALKPHOS 49 02/03/2016 1708   BILITOT 0.6 02/03/2016 1708   GFRNONAA >60 02/03/2016 1708   GFRAA >60 02/03/2016 1708       Component Value Date/Time   WBC 10.1 02/03/2016 1708   RBC 5.11 02/03/2016 1708   HGB 16.3 02/03/2016 1708   HCT 45.4 02/03/2016 1708   PLT 138 (L) 02/03/2016 1708   MCV 88.8 02/03/2016 1708   MCH 31.9 02/03/2016 1708   MCHC 35.9 02/03/2016 1708   RDW 13.1 02/03/2016 1708   LYMPHSABS 2.1 10/06/2015 0500   MONOABS 1.1 (H) 10/06/2015 0500   EOSABS 0.1 10/06/2015 0500   BASOSABS 0.1 10/06/2015 0500    No results found for: POCLITH, LITHIUM   No results found for: PHENYTOIN, PHENOBARB, VALPROATE, CBMZ   .res Assessment: Plan:    D[place] was seen today for anxiety and follow-up.  Diagnoses and all orders for this  visit:  Mixed obsessional thoughts and acts -     LORazepam (ATIVAN) 1 MG tablet; Take 1 tablet (1 mg total) by mouth 2 (two) times daily.  Panic disorder with agoraphobia  Insomnia due to other mental disorder  Depression, major, recurrent, in partial remission ([place])  Obstructive sleep apnea  SSRI sexual dysfunction  Greater than 50% of 30 min  face to face time with patient was spent on counseling and coordination of care. We discussed the following: Disc CPAP use and ways to improve mask sealing.  Disc importance of compliance for brain health.  He needs to contact [facility] and make an appt with them about it.  Sleep poor bc of it.  Sleep hygiene poor with TV on and explained to him.  He's unlikely to change. He's much better with meds.    Disc high dose sertraline and article supporting high dosage for TR OCD.  Disc risk serotonin syndrome.  Disc SSRI WD as cause for dizziness.  Advise against reduction from the high dosage because it is clearly helped his anxiety and depression.  His anxiety and depression are the best it has been in years.. Disc sexual SE likely from sertraline.  Option Viagra.  Disc pricing.  Says Dr. Arelia Sneddon gave him a sample without help.  Disc drug holiday. Consider reduce at [facility] for sexual SE  Discussed potential metabolic side effects associated with atypical antipsychotics, as well as potential risk for movement side effects. Advised pt to contact office if movement side effects occur. Getting benefit from olanzapine markedly and he agrees.  We discussed the short-term risks associated with benzodiazepines including sedation  and increased fall risk among others.  Discussed long-term side effect risk including dependence, potential withdrawal symptoms, and the potential eventual dose-related risk of dementia. Disc possibility of reducing lorazepam to 1/2 mg AM and 1 mg PM.  Encourage walking and weight loss.  Disc good RX witih med changes.  No  med changes today except as noted.  Continue olanzapine 5, sertraline 300, Ambien and lorazepam.  This appt was 30 mins.  FU 6 mos  Lynder Parents, MD, DFAPA    Please see After Visit Summary for patient specific instructions.  No future appointments.  No orders of the defined types were placed in this encounter.     -------------------------------

## 2019-11-17 DIAGNOSIS — Z20828 Contact with and (suspected) exposure to other viral communicable diseases: Secondary | ICD-10-CM | POA: Diagnosis not present

## 2019-11-17 DIAGNOSIS — L2089 Other atopic dermatitis: Secondary | ICD-10-CM | POA: Diagnosis not present

## 2019-11-17 DIAGNOSIS — S30860A Insect bite (nonvenomous) of lower back and pelvis, initial encounter: Secondary | ICD-10-CM | POA: Diagnosis not present

## 2019-11-17 DIAGNOSIS — R21 Rash and other nonspecific skin eruption: Secondary | ICD-10-CM | POA: Diagnosis not present

## 2019-11-17 DIAGNOSIS — M791 Myalgia, unspecified site: Secondary | ICD-10-CM | POA: Diagnosis not present

## 2019-11-20 ENCOUNTER — Other Ambulatory Visit: Payer: Self-pay

## 2019-11-20 DIAGNOSIS — F5105 Insomnia due to other mental disorder: Secondary | ICD-10-CM

## 2019-11-20 MED ORDER — ZOLPIDEM TARTRATE 10 MG PO TABS: ORAL_TABLET | ORAL | 2 refills | Status: DC

## 2019-12-29 DIAGNOSIS — G4733 Obstructive sleep apnea (adult) (pediatric): Secondary | ICD-10-CM | POA: Diagnosis not present

## 2020-02-19 ENCOUNTER — Other Ambulatory Visit: Payer: Self-pay | Admitting: Psychiatry

## 2020-02-19 DIAGNOSIS — F99 Mental disorder, not otherwise specified: Secondary | ICD-10-CM

## 2020-03-10 ENCOUNTER — Other Ambulatory Visit: Payer: Self-pay | Admitting: Psychiatry

## 2020-03-10 DIAGNOSIS — F422 Mixed obsessional thoughts and acts: Secondary | ICD-10-CM

## 2020-04-11 ENCOUNTER — Other Ambulatory Visit: Payer: Self-pay | Admitting: Psychiatry

## 2020-04-11 DIAGNOSIS — F422 Mixed obsessional thoughts and acts: Secondary | ICD-10-CM

## 2020-04-22 ENCOUNTER — Ambulatory Visit (INDEPENDENT_AMBULATORY_CARE_PROVIDER_SITE_OTHER): Payer: PPO | Admitting: Psychiatry

## 2020-04-22 ENCOUNTER — Encounter: Payer: Self-pay | Admitting: Psychiatry

## 2020-04-22 ENCOUNTER — Other Ambulatory Visit: Payer: Self-pay

## 2020-04-22 DIAGNOSIS — G4733 Obstructive sleep apnea (adult) (pediatric): Secondary | ICD-10-CM

## 2020-04-22 DIAGNOSIS — F5105 Insomnia due to other mental disorder: Secondary | ICD-10-CM | POA: Diagnosis not present

## 2020-04-22 DIAGNOSIS — F99 Mental disorder, not otherwise specified: Secondary | ICD-10-CM

## 2020-04-22 DIAGNOSIS — F4001 Agoraphobia with panic disorder: Secondary | ICD-10-CM

## 2020-04-22 DIAGNOSIS — F422 Mixed obsessional thoughts and acts: Secondary | ICD-10-CM

## 2020-04-22 DIAGNOSIS — F3341 Major depressive disorder, recurrent, in partial remission: Secondary | ICD-10-CM | POA: Diagnosis not present

## 2020-04-22 MED ORDER — ZOLPIDEM TARTRATE 10 MG PO TABS: ORAL_TABLET | ORAL | 5 refills | Status: DC

## 2020-04-22 MED ORDER — SERTRALINE HCL 100 MG PO TABS: ORAL_TABLET | ORAL | 1 refills | Status: DC

## 2020-04-22 MED ORDER — LORAZEPAM 1 MG PO TABS: 1.0000 mg | ORAL_TABLET | Freq: Two times a day (BID) | ORAL | 5 refills | Status: DC

## 2020-04-22 MED ORDER — OLANZAPINE 5 MG PO TABS: 5.0000 mg | ORAL_TABLET | Freq: Every day | ORAL | 1 refills | Status: DC

## 2020-04-22 NOTE — Progress Notes (Signed)
Joshua Eaton [id number] 01/01/1956 64 y.o.  Subjective:   Patient ID:  Joshua Eaton is a 64 y.o. (DOB 10-21-1955) male.  Chief Complaint:  Chief Complaint  Patient presents with  . Follow-up    Medication Management  . 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    Last seen October, 2020.  No med changes were made.   No Covid.  Still worries about it.  Has days where he feels that he doesn't exist.  Will be unmotivated but not nervous.    Enjoys cat.    M in law died at their house in 22-Mar-2019.  Lived there 12 years.  Still dealing with it.  Only time he was with someone who died.  Wife Joshua Eaton still grieving.   Usually feeling OK but this week not feeling all present, "like I'm outside of myself".  Bothered some there have been a lot of friends to die lately.  Thinks his anxiety is up some.  Never should have started watching news.  Disc Covid concerns too.  10/24/19 appt with the following noted:  No med changes Still Good and bad days. Bad days doesn't want to do things.  Can't find much activity.  Bored and not doing much.  Anxiety 7-8/10 with 10 good.  Depression is rated the same as anxiety which is all much improved. No problems with the meds. Except is forgetful per wife.   Still has chronic anxiety  But it's better than before the meds he's currently on.  Chronic fear of insomnia.  Appetite is very strong with the olanzapine.  Sleep is variable.  Last night good but bad last week.  He is not depressed.  He is not having mood swings or unusual irritability or anger.  Still has some rigidity and is easily anxious and highly avoidant and poorly functional overall.  He can enjoy some things.  Tries to stay active.  Going to [employer].  Worry over the country. Chronic Problems with CPAP.  Sleep not great as usual but sleeps with TV in bedroom.    Only 3-4 hours of sleep.  Using [place].  Doesn't think it's humidiyfing.  Has not talked to them about it yet.    CO poor sleep and still having problems with the mask of CPAP.  Has nasal pillows recently.  Leaks some still.   Stopped gabapentin with no problems. Weight down from 287 to 262#.  04/22/20 appt with the following noted: Days when doesn't feel right but otherwise anxiety is managed well.   Usually Sleep OK but not always. Favorite car is [employer], community and enjoys them.  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 Good interest. Still has occ obsessions but can usually dismiss easily. Patient reports stable mood and denies depressed or irritable moods.  Patient denies any recent difficulty with anxiety. Some chronic issues with CPAP and sleep. Denies appetite disturbance.  Patient reports that energy and motivation have been good.  Patient denies any difficulty with concentration.  Patient denies any suicidal ideation. No Ambien amnesia.  No sex in 62 mos bc difficulty.  Wonders about meds affecting.    Past Psychiatric Medication Trials:   Xanax, lorazepam, gabapentin, Ambien, temazepam, trazodone Geodon, Olanzapine, risperidone 0.25, fluvoxamine, sertraline 300, mirtazapine, fluoxetine 40,   quetiapine 25, topiramate 50,  Review of Systems:  Review of Systems  Constitutional: Negative for appetite change and unexpected weight change.  Eyes: Positive for visual disturbance.  Neurological: Negative for tremors and weakness.  Psychiatric/Behavioral: Negative for agitation, behavioral problems, confusion, decreased concentration, dysphoric mood, hallucinations, self-injury, sleep disturbance and suicidal ideas. The patient is nervous/anxious. The patient is not hyperactive.     Medications: I have reviewed the patient's current medications.  Current Outpatient Medications  Medication Sig Dispense Refill  . LORazepam (ATIVAN) 1 MG tablet Take 1 tablet (1 mg total) by mouth 2 (two) times daily. 60 tablet 5  .  Multiple Vitamins-Minerals (ICAPS AREDS 2) CAPS Take 1 capsule by mouth 2 (two) times daily. For eye care    . OLANZapine (ZYPREXA) 5 MG tablet Take 1 tablet (5 mg total) by mouth at bedtime. 90 tablet 1  . sertraline (ZOLOFT) 100 MG tablet TAKE 2 TABLETS BY MOUTH EVERY MORNING AND 1 TABLET IN THE EVENING 270 tablet 1  . TRIAMTERENE PO Take 25 mg by mouth daily.    Marland Kitchen zolpidem (AMBIEN) 10 MG tablet TAKE 1 TABLET BY MOUTH EVERYDAY AT BEDTIME 30 tablet 5   No current facility-administered medications for this visit.    Medication Side Effects: Other: weight  Allergies:  Allergies  Allergen Reactions  . Codeine     "sends me into orbit"    Past Medical History:  Diagnosis Date  . Anxiety   . Cancer ([place])   . Depression   . Hypertension   . OCD (obsessive compulsive disorder)     History reviewed. No pertinent family history.  Social History   Socioeconomic History  . Marital status: Married    Spouse name: Not on file  . Number of children: Not on file  . Years of education: Not on file  . Highest education level: Not on file  Occupational History  . Not on file  Tobacco Use  . Smoking status: Never Smoker  . Smokeless tobacco: Never Used  Substance and Sexual Activity  . Alcohol use: No  . Drug use: No  . Sexual activity: Not on file  Other Topics Concern  . Not on file  Social History Narrative  . Not on file   Social Determinants of Health   Financial Resource Strain:   . Difficulty of Paying Living Expenses: Not on file  Food Insecurity:   . Worried About Charity fundraiser in the Last Year: Not on file  . Ran Out of Food in the Last Year: Not on file  Transportation Needs:   . Lack of Transportation (Medical): Not on file  . Lack of Transportation (Non-Medical): Not on file  Physical Activity:   . Days of Exercise per Week: Not on file  . Minutes of Exercise per Session: Not on file  Stress:   . Feeling of Stress : Not on file  Social Connections:    . Frequency of Communication with Friends and Family: Not on file  . Frequency of Social Gatherings with Friends and Family: Not on file  . Attends Religious Services: Not on file  . Active Member of Clubs or Organizations: Not on file  . Attends Archivist Meetings: Not on file  . Marital Status: Not on file  Intimate Partner Violence:   . Fear of Current or Ex-Partner: Not on file  . Emotionally Abused: Not on file  . Physically Abused: Not on file  . Sexually Abused: Not on file    Past Medical History, Surgical history, Social history, and Family history  were reviewed and updated as appropriate.   Please see review of systems for further details on the patient's review from today.   Objective:   Physical Exam:  There were no vitals taken for this visit.  Physical Exam Constitutional:      Appearance: He is obese.  Neurological:     Mental Status: He is alert.     Sensory: No sensory deficit.     Gait: Gait normal.  Psychiatric:        Attention and Perception: Attention and perception normal.        Mood and Affect: Mood is anxious. Mood is not depressed.        Speech: Speech normal.        Behavior: Behavior is cooperative.        Thought Content: Thought content is not paranoid. Thought content does not include homicidal or suicidal ideation.        Cognition and Memory: Cognition normal.     Comments: Chronic anxiety but better with meds and overall better much than when he first came here. Fair insight and judgment. Chronic OCD still better than before Zoloft. Talkative per usual.     Lab Review:     Component Value Date/Time   NA 138 02/03/2016 1708   K 3.5 02/03/2016 1708   CL 110 02/03/2016 1708   CO2 19 (L) 02/03/2016 1708   GLUCOSE 212 (H) 02/03/2016 1708   BUN 21 (H) 02/03/2016 1708   CREATININE 1.02 02/03/2016 1708   CALCIUM 8.9 02/03/2016 1708   PROT 6.6 02/03/2016 1708   ALBUMIN 3.8 02/03/2016 1708   AST 23 02/03/2016 1708   ALT  24 02/03/2016 1708   ALKPHOS 49 02/03/2016 1708   BILITOT 0.6 02/03/2016 1708   GFRNONAA >60 02/03/2016 1708   GFRAA >60 02/03/2016 1708       Component Value Date/Time   WBC 10.1 02/03/2016 1708   RBC 5.11 02/03/2016 1708   HGB 16.3 02/03/2016 1708   HCT 45.4 02/03/2016 1708   PLT 138 (L) 02/03/2016 1708   MCV 88.8 02/03/2016 1708   MCH 31.9 02/03/2016 1708   MCHC 35.9 02/03/2016 1708   RDW 13.1 02/03/2016 1708   LYMPHSABS 2.1 10/06/2015 0500   MONOABS 1.1 (H) 10/06/2015 0500   EOSABS 0.1 10/06/2015 0500   BASOSABS 0.1 10/06/2015 0500    No results found for: POCLITH, LITHIUM   No results found for: PHENYTOIN, PHENOBARB, VALPROATE, CBMZ   .res Assessment: Plan:    Sharmarke was seen today for follow-up and anxiety.  Diagnoses and all orders for this visit:  Panic disorder with agoraphobia  Mixed obsessional thoughts and acts -     LORazepam (ATIVAN) 1 MG tablet; Take 1 tablet (1 mg total) by mouth 2 (two) times daily. -     OLANZapine (ZYPREXA) 5 MG tablet; Take 1 tablet (5 mg total) by mouth at bedtime. -     sertraline (ZOLOFT) 100 MG tablet; TAKE 2 TABLETS BY MOUTH EVERY MORNING AND 1 TABLET IN THE EVENING  Insomnia due to other mental disorder -     zolpidem (AMBIEN) 10 MG tablet; TAKE 1 TABLET BY MOUTH EVERYDAY AT BEDTIME  Depression, major, recurrent, in partial remission (HCC)  Obstructive sleep apnea  SSRI sexual dysfunction  Greater than 50% of 30 min  face to face time with patient was spent on counseling and coordination of care. We discussed the following:  Residual obsession are a lot better but not gone. Disc  CPAP use and ways to improve mask sealing.  Disc importance of compliance for brain health.  He needs to contact [place] care and make an appt with them about it.  Sleep poor bc of it.  Sleep hygiene poor with TV on and explained to him.  He's unlikely to change. He's much better with meds.    Disc high dose sertraline and article supporting  high dosage for TR OCD.  Disc risk serotonin syndrome.  Disc SSRI WD as cause for dizziness.  Advise against reduction from the high dosage because it is clearly helped his anxiety and depression.  His anxiety and depression are the best it has been in years.. Disc sexual SE likely from sertraline.  Option Viagra.  Disc pricing.  Says Dr. Arelia Sneddon gave him a sample without help.  Disc drug holiday.  Discussed potential metabolic side effects associated with atypical antipsychotics, as well as potential risk for movement side effects. Advised pt to contact office if movement side effects occur. Getting benefit from olanzapine markedly and he agrees.  We discussed the short-term risks associated with benzodiazepines including sedation and increased fall risk among others.  Discussed long-term side effect risk including dependence, potential withdrawal symptoms, and the potential eventual dose-related risk of dementia.  Encourage walking and weight loss.  Disc good RX witih med changes.  No med changes.  He doesn't know the name of the meds.  Continue olanzapine 5, sertraline 300, Ambien and lorazepam.  This appt was 30 mins.  FU 6 mos  Lynder Parents, MD, DFAPA    Please see After Visit Summary for patient specific instructions.  No future appointments.  No orders of the defined types were placed in this encounter.     -------------------------------

## 2020-05-20 ENCOUNTER — Other Ambulatory Visit: Payer: Self-pay | Admitting: Psychiatry

## 2020-05-20 DIAGNOSIS — F5105 Insomnia due to other mental disorder: Secondary | ICD-10-CM

## 2020-09-27 DIAGNOSIS — U071 COVID-19: Secondary | ICD-10-CM | POA: Diagnosis not present

## 2020-09-27 DIAGNOSIS — Z20828 Contact with and (suspected) exposure to other viral communicable diseases: Secondary | ICD-10-CM | POA: Diagnosis not present

## 2020-10-21 ENCOUNTER — Other Ambulatory Visit: Payer: Self-pay

## 2020-10-21 ENCOUNTER — Encounter: Payer: Self-pay | Admitting: Psychiatry

## 2020-10-21 ENCOUNTER — Ambulatory Visit (INDEPENDENT_AMBULATORY_CARE_PROVIDER_SITE_OTHER): Payer: PPO | Admitting: Psychiatry

## 2020-10-21 DIAGNOSIS — G4733 Obstructive sleep apnea (adult) (pediatric): Secondary | ICD-10-CM

## 2020-10-21 DIAGNOSIS — F4001 Agoraphobia with panic disorder: Secondary | ICD-10-CM

## 2020-10-21 DIAGNOSIS — F422 Mixed obsessional thoughts and acts: Secondary | ICD-10-CM

## 2020-10-21 DIAGNOSIS — F5105 Insomnia due to other mental disorder: Secondary | ICD-10-CM

## 2020-10-21 DIAGNOSIS — F3341 Major depressive disorder, recurrent, in partial remission: Secondary | ICD-10-CM

## 2020-10-21 DIAGNOSIS — F99 Mental disorder, not otherwise specified: Secondary | ICD-10-CM | POA: Diagnosis not present

## 2020-10-21 MED ORDER — SERTRALINE HCL 100 MG PO TABS: ORAL_TABLET | ORAL | 1 refills | Status: DC

## 2020-10-21 MED ORDER — LORAZEPAM 1 MG PO TABS: ORAL_TABLET | ORAL | 5 refills | Status: DC

## 2020-10-21 MED ORDER — ZOLPIDEM TARTRATE 10 MG PO TABS
ORAL_TABLET | ORAL | 5 refills | Status: DC
Start: 2020-10-21 — End: 2020-11-15

## 2020-10-21 MED ORDER — OLANZAPINE 5 MG PO TABS: 5.0000 mg | ORAL_TABLET | Freq: Every day | ORAL | 1 refills | Status: DC

## 2020-10-21 NOTE — Progress Notes (Signed)
Joshua Eaton [id number] 06/25/1956 65 y.o.  Subjective:   Patient ID:  Joshua Eaton is a 65 y.o. (DOB 04-30-1956) male.  Chief Complaint:  Chief Complaint  Patient presents with  . Follow-up  . Panic disorder with agoraphobia  . Anxiety  . 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    seen October, 2020.  No med changes were made.   10/24/19 appt with the following noted:  No med changes Still Good and bad days. Bad days doesn't want to do things.  Can't find much activity.  Bored and not doing much.  Anxiety 7-8/10 with 10 good.  Depression is rated the same as anxiety which is all much improved. No problems with the meds. Except is forgetful per wife.   Still has chronic anxiety  But it's better than before the meds he's currently on.  Chronic fear of insomnia.  Appetite is very strong with the olanzapine.  Sleep is variable.  Last night good but bad last week.  He is not depressed.  He is not having mood swings or unusual irritability or anger.  Still has some rigidity and is easily anxious and highly avoidant and poorly functional overall.  He can enjoy some things.  Tries to stay active.  Going to [employer].  Worry over the country. Chronic Problems with CPAP.  Sleep not great as usual but sleeps with TV in bedroom.    Only 3-4 hours of sleep.  Using [place].  Doesn't think it's humidiyfing.  Has not talked to them about it yet.    CO poor sleep and still having problems with the mask of CPAP.  Has nasal pillows recently.  Leaks some still.   Stopped gabapentin with no problems. Weight down from 287 to 262#.  04/22/20 appt with the following noted: Days when doesn't feel right but otherwise anxiety is managed well.   Usually Sleep OK but not always. Favorite car is [employer], community and enjoys them.  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 Good interest. Still has occ obsessions but  can usually dismiss easily. Patient reports stable mood and denies depressed or irritable moods.  Patient denies any recent difficulty with anxiety. Some chronic issues with CPAP and sleep. Denies appetite disturbance.  Patient reports that energy and motivation have been good.  Patient denies any difficulty with concentration.  Patient denies any suicidal ideation. No Ambien amnesia. No sex in 21 mos bc difficulty.  Wonders about meds affecting.   Plan no med changes Continue sertraline 300 mg daily, olanzapine 5 mg nightly and lorazepam 1 mg twice daily and zolpidem 10 mg nightly.  10/21/2020 appt with following noted:  Seen with wife Joshua Eaton Situational stressors.  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 Not as sensitive with caffeine. Missed Ativan 2 days and felt it.  Otherwise been OK with meds.   Consistent. Still balance problems.   No numbness in feet.   Has macular degeneration and history of repeated ear infections. Has been able to reduce lorazepam to 1/2 mg AM and 1 mg PM.  Has had a few panic attacks over the last few months.  Car market is bad bc high prices.  Wife says he's forgetful.   Rarely obsesses if triggered.   Sleep is OK at night.  Past Psychiatric Medication Trials:   Xanax, lorazepam, gabapentin, Ambien, temazepam, trazodone, mirtazapine, quetiapine 25, Geodon, Olanzapine, risperidone 0.25, fluvoxamine, sertraline 300, fluoxetine 40,     topiramate 50,  Review of Systems:  Review of Systems  Constitutional: Negative for appetite change and unexpected weight change.  Eyes: Positive for visual disturbance.  Neurological: Negative for tremors and weakness.       Balance trouble ongoing  Psychiatric/Behavioral: Negative for agitation, behavioral problems, confusion, decreased concentration, dysphoric mood, hallucinations, self-injury, sleep disturbance and suicidal ideas. The patient is nervous/anxious. The patient is not  hyperactive.     Medications: I have reviewed the patient's current medications.  Current Outpatient Medications  Medication Sig Dispense Refill  . LORazepam (ATIVAN) 1 MG tablet Take 1 tablet (1 mg total) by mouth 2 (two) times daily. (Patient taking differently: Take 1 mg by mouth 2 (two) times daily. 1/2 tablet in the AM and 1 tablet at night) 60 tablet 5  . Multiple Vitamins-Minerals (ICAPS AREDS 2) CAPS Take 1 capsule by mouth 2 (two) times daily. For eye care    . OLANZapine (ZYPREXA) 5 MG tablet Take 1 tablet (5 mg total) by mouth at bedtime. 90 tablet 1  . sertraline (ZOLOFT) 100 MG tablet TAKE 2 TABLETS BY MOUTH EVERY MORNING AND 1 TABLET IN THE EVENING 270 tablet 1  . zolpidem (AMBIEN) 10 MG tablet TAKE 1 TABLET BY MOUTH EVERYDAY AT BEDTIME 30 tablet 5  . TRIAMTERENE PO Take 25 mg by mouth daily. (Patient not taking: Reported on 10/21/2020)     No current facility-administered medications for this visit.    Medication Side Effects: Other: weight  Allergies:  Allergies  Allergen Reactions  . Codeine     "sends me into orbit"    Past Medical History:  Diagnosis Date  . Anxiety   . Cancer ([place])   . Depression   . Hypertension   . OCD (obsessive compulsive disorder)     History reviewed. No pertinent family history.  Social History   Socioeconomic History  . Marital status: Married    Spouse name: Not on file  . Number of children: Not on file  . Years of education: Not on file  . Highest education level: Not on file  Occupational History  . Not on file  Tobacco Use  . Smoking status: Never Smoker  . Smokeless tobacco: Never Used  Substance and Sexual Activity  . Alcohol use: No  . Drug use: No  . Sexual activity: Not on file  Other Topics Concern  . Not on file  Social History Narrative  . Not on file   Social Determinants of Health   Financial Resource Strain: Not on file  Food Insecurity: Not on file  Transportation Needs: Not on file  Physical  Activity: Not on file  Stress: Not on file  Social Connections: Not on file  Intimate Partner Violence: Not on file    Past Medical History, Surgical history, Social history, and Family history were reviewed and updated as appropriate.   Please see review of systems for further details on the patient's review from today.   Objective:   Physical Exam:  There were no vitals taken for this visit.  Physical Exam Constitutional:      Appearance: He is obese.  Neurological:     Mental Status: He is alert.     Sensory: No sensory deficit.     Gait: Gait normal.  Psychiatric:        Attention  and Perception: Attention and perception normal.        Mood and Affect: Mood is anxious. Mood is not depressed.        Speech: Speech normal.        Behavior: Behavior is cooperative.        Thought Content: Thought content is not paranoid. Thought content does not include homicidal or suicidal ideation.        Cognition and Memory: Cognition normal.     Comments: Chronic anxiety but better with meds and overall better much than when he first came here. Fair insight and judgment. Chronic OCD still better than before Zoloft. Talkative per usual.     Lab Review:     Component Value Date/Time   NA 138 02/03/2016 1708   K 3.5 02/03/2016 1708   CL 110 02/03/2016 1708   CO2 19 (L) 02/03/2016 1708   GLUCOSE 212 (H) 02/03/2016 1708   BUN 21 (H) 02/03/2016 1708   CREATININE 1.02 02/03/2016 1708   CALCIUM 8.9 02/03/2016 1708   PROT 6.6 02/03/2016 1708   ALBUMIN 3.8 02/03/2016 1708   AST 23 02/03/2016 1708   ALT 24 02/03/2016 1708   ALKPHOS 49 02/03/2016 1708   BILITOT 0.6 02/03/2016 1708   GFRNONAA >60 02/03/2016 1708   GFRAA >60 02/03/2016 1708       Component Value Date/Time   WBC 10.1 02/03/2016 1708   RBC 5.11 02/03/2016 1708   HGB 16.3 02/03/2016 1708   HCT 45.4 02/03/2016 1708   PLT 138 (L) 02/03/2016 1708   MCV 88.8 02/03/2016 1708   MCH 31.9 02/03/2016 1708   MCHC 35.9  02/03/2016 1708   RDW 13.1 02/03/2016 1708   LYMPHSABS 2.1 10/06/2015 0500   MONOABS 1.1 (H) 10/06/2015 0500   EOSABS 0.1 10/06/2015 0500   BASOSABS 0.1 10/06/2015 0500    No results found for: POCLITH, LITHIUM   No results found for: PHENYTOIN, PHENOBARB, VALPROATE, CBMZ   .res Assessment: Plan:    Nethaniel was seen today for follow-up, panic disorder with agoraphobia, anxiety and sleeping problem.  Diagnoses and all orders for this visit:  Panic disorder with agoraphobia  Mixed obsessional thoughts and acts  Depression, major, recurrent, in partial remission ([place])  Insomnia due to other mental disorder  Obstructive sleep apnea  SSRI sexual dysfunction  Greater than 50% of 30 min  face to face time with patient was spent on counseling and coordination of care. We discussed the following:  Residual obsession are a lot better but not gone. Disc CPAP use and ways to improve mask sealing. He's not using it consistently lately. Disc importance of compliance for brain health.  He needs to contact [place] [facility] care and make an appt with them about it.  Sleep poor bc of it.  Sleep hygiene poor with TV on and explained to him.  He's unlikely to change. He's much better with meds.    USE C PAP FOR MEMORY, HEART HEALTH.  Disc high dose sertraline and article supporting high dosage for TR OCD.  Disc risk serotonin syndrome.  Disc SSRI WD as cause for dizziness.  Advise against reduction from the high dosage because it is clearly helped his anxiety and depression.  His anxiety and depression are the best it has been in years.. Disc sexual SE likely from sertraline.  Option Viagra.  Disc pricing.  Says Dr. Arelia Sneddon gave him a sample without help.  Disc drug holiday.  Discussed potential metabolic side effects associated with  atypical antipsychotics, as well as potential risk for movement side effects. Advised pt to contact office if movement side effects occur. Getting benefit from olanzapine  markedly and he agrees.  We discussed the short-term risks associated with benzodiazepines including sedation and increased fall risk among others.  Discussed long-term side effect risk including dependence, potential withdrawal symptoms, and the potential eventual dose-related risk of dementia.  Encourage walking and weight loss.  Disc good RX witih med changes.  Trial reduce olanzapine 1/2 of 5 mg tablet at night to see if balance is any better.  Disc risk.  Continue sertraline 300, Ambien and lorazepam. Use LED of Ambien and try reducing if possible.  This appt was 30 mins.  FU 6 mos  Lynder Parents, MD, DFAPA    Please see After Visit Summary for patient specific instructions.  No future appointments.  No orders of the defined types were placed in this encounter.     -------------------------------

## 2020-11-14 ENCOUNTER — Other Ambulatory Visit: Payer: Self-pay | Admitting: Psychiatry

## 2020-11-14 DIAGNOSIS — F5105 Insomnia due to other mental disorder: Secondary | ICD-10-CM

## 2020-11-15 NOTE — Telephone Encounter (Signed)
Controlled substance 

## 2020-11-24 ENCOUNTER — Other Ambulatory Visit: Payer: Self-pay | Admitting: Psychiatry

## 2020-11-24 DIAGNOSIS — F422 Mixed obsessional thoughts and acts: Secondary | ICD-10-CM

## 2020-12-28 DIAGNOSIS — G4733 Obstructive sleep apnea (adult) (pediatric): Secondary | ICD-10-CM | POA: Diagnosis not present

## 2021-02-19 DIAGNOSIS — G4733 Obstructive sleep apnea (adult) (pediatric): Secondary | ICD-10-CM | POA: Diagnosis not present

## 2021-03-27 ENCOUNTER — Other Ambulatory Visit: Payer: Self-pay | Admitting: Psychiatry

## 2021-03-27 DIAGNOSIS — F422 Mixed obsessional thoughts and acts: Secondary | ICD-10-CM

## 2021-04-23 DIAGNOSIS — H353131 Nonexudative age-related macular degeneration, bilateral, early dry stage: Secondary | ICD-10-CM | POA: Diagnosis not present

## 2021-04-23 DIAGNOSIS — H2513 Age-related nuclear cataract, bilateral: Secondary | ICD-10-CM | POA: Diagnosis not present

## 2021-04-23 DIAGNOSIS — H25013 Cortical age-related cataract, bilateral: Secondary | ICD-10-CM | POA: Diagnosis not present

## 2021-04-23 DIAGNOSIS — H524 Presbyopia: Secondary | ICD-10-CM | POA: Diagnosis not present

## 2021-04-23 DIAGNOSIS — H31093 Other chorioretinal scars, bilateral: Secondary | ICD-10-CM | POA: Diagnosis not present

## 2021-04-24 ENCOUNTER — Other Ambulatory Visit: Payer: Self-pay

## 2021-04-24 ENCOUNTER — Encounter: Payer: Self-pay | Admitting: Psychiatry

## 2021-04-24 ENCOUNTER — Ambulatory Visit: Payer: PPO | Admitting: Psychiatry

## 2021-04-24 DIAGNOSIS — F99 Mental disorder, not otherwise specified: Secondary | ICD-10-CM | POA: Diagnosis not present

## 2021-04-24 DIAGNOSIS — F3341 Major depressive disorder, recurrent, in partial remission: Secondary | ICD-10-CM | POA: Diagnosis not present

## 2021-04-24 DIAGNOSIS — F422 Mixed obsessional thoughts and acts: Secondary | ICD-10-CM | POA: Diagnosis not present

## 2021-04-24 DIAGNOSIS — F4001 Agoraphobia with panic disorder: Secondary | ICD-10-CM | POA: Diagnosis not present

## 2021-04-24 DIAGNOSIS — F5105 Insomnia due to other mental disorder: Secondary | ICD-10-CM | POA: Diagnosis not present

## 2021-04-24 DIAGNOSIS — G4733 Obstructive sleep apnea (adult) (pediatric): Secondary | ICD-10-CM

## 2021-04-24 MED ORDER — SERTRALINE HCL 100 MG PO TABS: ORAL_TABLET | ORAL | 1 refills | Status: DC

## 2021-04-24 MED ORDER — LORAZEPAM 1 MG PO TABS: ORAL_TABLET | ORAL | 5 refills | Status: DC

## 2021-04-24 MED ORDER — ZOLPIDEM TARTRATE 10 MG PO TABS: ORAL_TABLET | ORAL | 5 refills | Status: DC

## 2021-04-24 MED ORDER — OLANZAPINE 5 MG PO TABS: 5.0000 mg | ORAL_TABLET | Freq: Every day | ORAL | 1 refills | Status: DC

## 2021-04-24 NOTE — Progress Notes (Signed)
Joshua Eaton [id number] February 27, 1956 65 y.o.  Subjective:   Patient ID:  Joshua Eaton is a 65 y.o. (DOB 28-Oct-1955) male.  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    seen October, 2020.  No med changes were made.   10/24/19 appt with the following noted:  No med changes Still Good and bad days. Bad days doesn't want to do things.  Can't find much activity.  Bored and not doing much.  Anxiety 7-8/10 with 10 good.  Depression is rated the same as anxiety which is all much improved. No problems with the meds. Except is forgetful per wife.   Still has chronic anxiety  But it's better than before the meds he's currently on.  Chronic fear of insomnia.  Appetite is very strong with the olanzapine.  Sleep is variable.  Last night good but bad last week.  He is not depressed.  He is not having mood swings or unusual irritability or anger.  Still has some rigidity and is easily anxious and highly avoidant and poorly functional overall.  He can enjoy some things.  Tries to stay active.  Going to [employer].  Worry over the country. Chronic Problems with CPAP.  Sleep not great as usual but sleeps with TV in bedroom.    Only 3-4 hours of sleep.  Using [place].  Doesn't think it's humidiyfing.  Has not talked to them about it yet.    CO poor sleep and still having problems with the mask of CPAP.  Has nasal pillows recently.  Leaks some still.   Stopped gabapentin with no problems. Weight down from 287 to 262#.  04/22/20 appt with the following noted: Days when doesn't feel right but otherwise anxiety is managed well.   Usually Sleep OK but not always. Favorite car is [employer] community and enjoys them.  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 Good interest. Still has occ obsessions  but can usually dismiss easily. Patient reports stable mood and denies depressed or irritable moods.  Patient denies any recent difficulty with anxiety. Some chronic issues with CPAP and sleep. Denies appetite disturbance.  Patient reports that energy and motivation have been good.  Patient denies any difficulty with concentration.  Patient denies any suicidal ideation. No Ambien amnesia. No sex in 52 mos bc difficulty.  Wonders about meds affecting.   Plan no med changes Continue sertraline 300 mg daily, olanzapine 5 mg nightly and lorazepam 1 mg twice daily and zolpidem 10 mg nightly.  10/21/2020 appt with following noted:  Seen with wife Joshua Eaton Situational stressors.  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 Not as sensitive with caffeine. Missed Ativan 2 days and felt it.  Otherwise been OK with meds.   Consistent. Still balance problems.   No numbness in feet.   Has macular degeneration and history of repeated ear infections. Has been able to reduce lorazepam to 1/2 mg AM and 1 mg PM.  Has had a few panic attacks over the last few months.  Car market is bad bc high prices.  Wife says he's forgetful.   Rarely obsesses if triggered.   Sleep is OK at night.  Plan: Trial reduce olanzapine 1/2 of 5 mg tablet at night to see if balance is any better.  Disc risk.  Continue sertraline 300, Ambien and lorazepam. Use LED of Ambien and try reducing if possible.  04/24/21 appt noted:  seen with wife Joshua Eaton Never tried reducing olanzapine below 5 mg HS. Memory is ok somewhat but trouble with low back pain and hard to start walking. No recent falls.  One of them caused him to hit head on car.   SP mild Covid. Sleep not good the last few weeks but was good until lately.  May nap in the morning. Sometimes forgets am meds.  But uses pill box.  W says he's more forgetful.   Not using CPAP.    Past Psychiatric Medication Trials:   Xanax, lorazepam, gabapentin,  Ambien, temazepam, trazodone, mirtazapine, quetiapine 25, Geodon, Olanzapine, risperidone 0.25, fluvoxamine, sertraline 300, fluoxetine 40,     topiramate 50,  Review of Systems:  Review of Systems  Constitutional:  Negative for appetite change and unexpected weight change.  Eyes:  Positive for visual disturbance.  Musculoskeletal:  Positive for back pain.  Neurological:  Positive for dizziness and weakness. Negative for tremors.       Balance trouble ongoing  Psychiatric/Behavioral:  Negative for agitation, behavioral problems, confusion, decreased concentration, dysphoric mood, hallucinations, self-injury, sleep disturbance and suicidal ideas. The patient is nervous/anxious. The patient is not hyperactive.    Medications: I have reviewed the patient's current medications.  Current Outpatient Medications  Medication Sig Dispense Refill   Multiple Vitamins-Minerals (ICAPS AREDS 2) CAPS Take 1 capsule by mouth 2 (two) times daily. For eye care     LORazepam (ATIVAN) 1 MG tablet 1/2 tablet in the morning and 1 tablet at night. 45 tablet 5   OLANZapine (ZYPREXA) 5 MG tablet Take 1 tablet (5 mg total) by mouth at bedtime. 90 tablet 1   sertraline (ZOLOFT) 100 MG tablet TAKE 2 TABLETS BY MOUTH EVERY MORNING AND 1 TABLET IN THE EVENING 270 tablet 1   TRIAMTERENE PO Take 25 mg by mouth daily. (Patient not taking: No sig reported)     zolpidem (AMBIEN) 10 MG tablet TAKE 1 TABLET BY MOUTH EVERYDAY AT BEDTIME 30 tablet 5   No current facility-administered medications for this visit.    Medication Side Effects: Other: weight  Allergies:  Allergies  Allergen Reactions   Codeine     "sends me into orbit"    Past Medical History:  Diagnosis Date   Anxiety    Cancer ([place])    Depression    Hypertension    OCD (obsessive compulsive disorder)     History reviewed. No pertinent family history.  Social History   Socioeconomic History   Marital status: Married    Spouse name: Not on  file   Number of children: Not on file   Years of education: Not on file   Highest education level: Not on file  Occupational History   Not on file  Tobacco Use   Smoking status: Never   Smokeless tobacco: Never  Substance and Sexual Activity   Alcohol use: No   Drug use: No   Sexual activity: Not on file  Other Topics Concern   Not on file  Social History Narrative   Not on file   Social Determinants of Health   Financial Resource Strain: Not on file  Food Insecurity: Not on file  Transportation Needs: Not on file  Physical Activity: Not on file  Stress:  Not on file  Social Connections: Not on file  Intimate Partner Violence: Not on file    Past Medical History, Surgical history, Social history, and Family history were reviewed and updated as appropriate.   Please see review of systems for further details on the patient's review from today.   Objective:   Physical Exam:  There were no vitals taken for this visit.  Physical Exam Constitutional:      Appearance: He is obese.  Neurological:     Mental Status: He is alert.     Sensory: No sensory deficit.     Gait: Gait normal.  Psychiatric:        Attention and Perception: Attention and perception normal.        Mood and Affect: Mood is anxious. Mood is not depressed.        Speech: Speech normal.        Behavior: Behavior is cooperative.        Thought Content: Thought content is not paranoid. Thought content does not include homicidal or suicidal ideation.        Cognition and Memory: Cognition normal.     Comments: Chronic anxiety waxes and wanes and overall much better than when he first came here. Fair insight and judgment. Chronic OCD still better than before Zoloft. Talkative per usual. But not a good historian.    Lab Review:     Component Value Date/Time   NA 138 02/03/2016 1708   K 3.5 02/03/2016 1708   CL 110 02/03/2016 1708   CO2 19 (L) 02/03/2016 1708   GLUCOSE 212 (H) 02/03/2016 1708   BUN  21 (H) 02/03/2016 1708   CREATININE 1.02 02/03/2016 1708   CALCIUM 8.9 02/03/2016 1708   PROT 6.6 02/03/2016 1708   ALBUMIN 3.8 02/03/2016 1708   AST 23 02/03/2016 1708   ALT 24 02/03/2016 1708   ALKPHOS 49 02/03/2016 1708   BILITOT 0.6 02/03/2016 1708   GFRNONAA >60 02/03/2016 1708   GFRAA >60 02/03/2016 1708       Component Value Date/Time   WBC 10.1 02/03/2016 1708   RBC 5.11 02/03/2016 1708   HGB 16.3 02/03/2016 1708   HCT 45.4 02/03/2016 1708   PLT 138 (L) 02/03/2016 1708   MCV 88.8 02/03/2016 1708   MCH 31.9 02/03/2016 1708   MCHC 35.9 02/03/2016 1708   RDW 13.1 02/03/2016 1708   LYMPHSABS 2.1 10/06/2015 0500   MONOABS 1.1 (H) 10/06/2015 0500   EOSABS 0.1 10/06/2015 0500   BASOSABS 0.1 10/06/2015 0500    No results found for: POCLITH, LITHIUM   No results found for: PHENYTOIN, PHENOBARB, VALPROATE, CBMZ   .res Assessment: Plan:    Joshua Eaton was seen today for mixed obsessional thoughts and acts, follow-up, anxiety and medication reaction.  Diagnoses and all orders for this visit:  Mixed obsessional thoughts and acts -     LORazepam (ATIVAN) 1 MG tablet; 1/2 tablet in the morning and 1 tablet at night. -     OLANZapine (ZYPREXA) 5 MG tablet; Take 1 tablet (5 mg total) by mouth at bedtime. -     sertraline (ZOLOFT) 100 MG tablet; TAKE 2 TABLETS BY MOUTH EVERY MORNING AND 1 TABLET IN THE EVENING  Panic disorder with agoraphobia  Depression, major, recurrent, in partial remission (HCC)  Insomnia due to other mental disorder -     zolpidem (AMBIEN) 10 MG tablet; TAKE 1 TABLET BY MOUTH EVERYDAY AT BEDTIME  Obstructive sleep apnea SSRI sexual dysfunction  Greater than 50% of 30 min  face to face time with patient was spent on counseling and coordination of care. We discussed the following:  Residual obsession are a lot better but not gone. I don't think memory problems are med related but related to not using CPAP. Disc CPAP use and ways to improve mask sealing.  He's not using it consistently lately. Disc importance of compliance for brain health.  He needs to contact [place] care and make an appt with them about it.  Sleep poor bc of it.  Sleep hygiene poor with TV on and explained to him.  He's unlikely to change. He's much better with meds.    USE C PAP FOR MEMORY, HEART HEALTH.  He probably will not comply. Disc caffeine.    Disc high dose sertraline and article supporting high dosage for TR OCD.  Disc risk serotonin syndrome.  Disc SSRI WD as cause for dizziness.  Advise against reduction from the high dosage because it is clearly helped his anxiety and depression.  His anxiety and depression are the best it has been in years.. Disc sexual SE likely from sertraline.  Option Viagra.  Disc pricing.  Says Dr. Arelia Sneddon gave him a sample without help.  Disc drug holiday.  Extensive discussion of timing of olanzapine and Ambien which need to be separated to have most benefit for sleep.  Not sure he's doing this correctly.  Plan no med changes Continue sertraline 300 mg daily, olanzapine 5 mg nightly and lorazepam 1 mg twice daily and zolpidem 10 mg nightly.  Discussed potential metabolic side effects associated with atypical antipsychotics, as well as potential risk for movement side effects. Advised pt to contact office if movement side effects occur. Getting benefit from olanzapine markedly and he agrees.  We discussed the short-term risks associated with benzodiazepines including sedation and increased fall risk among others.  Discussed long-term side effect risk including dependence, potential withdrawal symptoms, and the potential eventual dose-related risk of dementia.  Encourage walking and weight loss. Encourage see PCP about weakness and falls.  Disc good RX witih med changes.  This appt was 30 mins.  FU 6 mos  Lynder Parents, MD, DFAPA    Please see After Visit Summary for patient specific instructions.  No future  appointments.  No orders of the defined types were placed in this encounter.     -------------------------------

## 2021-10-21 ENCOUNTER — Ambulatory Visit: Payer: Medicare Other | Admitting: Psychiatry

## 2021-10-21 ENCOUNTER — Other Ambulatory Visit: Payer: Self-pay

## 2021-10-21 ENCOUNTER — Encounter: Payer: Self-pay | Admitting: Psychiatry

## 2021-10-21 DIAGNOSIS — F422 Mixed obsessional thoughts and acts: Secondary | ICD-10-CM

## 2021-10-21 DIAGNOSIS — F4001 Agoraphobia with panic disorder: Secondary | ICD-10-CM

## 2021-10-21 DIAGNOSIS — F3341 Major depressive disorder, recurrent, in partial remission: Secondary | ICD-10-CM | POA: Diagnosis not present

## 2021-10-21 DIAGNOSIS — F5105 Insomnia due to other mental disorder: Secondary | ICD-10-CM

## 2021-10-21 DIAGNOSIS — F99 Mental disorder, not otherwise specified: Secondary | ICD-10-CM

## 2021-10-21 DIAGNOSIS — G4733 Obstructive sleep apnea (adult) (pediatric): Secondary | ICD-10-CM

## 2021-10-21 MED ORDER — SERTRALINE HCL 100 MG PO TABS: ORAL_TABLET | ORAL | 1 refills | Status: DC

## 2021-10-21 MED ORDER — LORAZEPAM 1 MG PO TABS: ORAL_TABLET | ORAL | 1 refills | Status: DC

## 2021-10-21 MED ORDER — ZOLPIDEM TARTRATE 10 MG PO TABS: ORAL_TABLET | ORAL | 5 refills | Status: DC

## 2021-10-21 MED ORDER — OLANZAPINE 5 MG PO TABS: 5.0000 mg | ORAL_TABLET | Freq: Every day | ORAL | 1 refills | Status: DC

## 2021-10-21 NOTE — Progress Notes (Signed)
Joshua Eaton [id number] 17-Nov-1955 66 y.o.  Subjective:   Patient ID:  Joshua Eaton is a 66 y.o. (DOB [birth year]) male.  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    seen October, 2020.  No med changes were made.   10/24/19 appt with the following noted:  No med changes Still Good and bad days. Bad days doesn't want to do things.  Can't find much activity.  Bored and not doing much.  Anxiety 7-8/10 with 10 good.  Depression is rated the same as anxiety which is all much improved. No problems with the meds. Except is forgetful per wife.   Still has chronic anxiety  But it's better than before the meds he's currently on.  Chronic fear of insomnia.  Appetite is very strong with the olanzapine.  Sleep is variable.  Last night good but bad last week.  He is not depressed.  He is not having mood swings or unusual irritability or anger.  Still has some rigidity and is easily anxious and highly avoidant and poorly functional overall.  He can enjoy some things.  Tries to stay active.  Going to [place].  Worry over the country. Chronic Problems with CPAP.  Sleep not great as usual but sleeps with TV in bedroom.    Only 3-4 hours of sleep.  Using [place].  Doesn't think it's humidiyfing.  Has not talked to them about it yet.    CO poor sleep and still having problems with the mask of CPAP.  Has nasal pillows recently.  Leaks some still.   Stopped gabapentin with no problems. Weight down from 287 to 262#.  04/22/20 appt with the following noted: Days when doesn't feel right but otherwise anxiety is managed well.   Usually Sleep OK but not always. Favorite car is [employer], community and enjoys them.  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 Good interest. Still has occ  obsessions but can usually dismiss easily. Patient reports stable mood and denies depressed or irritable moods.  Patient denies any recent difficulty with anxiety. Some chronic issues with CPAP and sleep. Denies appetite disturbance.  Patient reports that energy and motivation have been good.  Patient denies any difficulty with concentration.  Patient denies any suicidal ideation. No Ambien amnesia. No sex in 42 mos bc difficulty.  Wonders about meds affecting.   Plan no med changes Continue sertraline 300 mg daily, olanzapine 5 mg nightly and lorazepam 1 mg twice daily and zolpidem 10 mg nightly.  10/21/2020 appt with following noted:  Seen with wife Joshua Eaton Situational stressors.  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 Not as sensitive with caffeine. Missed Ativan 2 days and felt it.  Otherwise been OK with meds.   Consistent. Still balance problems.   No numbness in feet.   Has macular degeneration and history of repeated ear infections. Has been able to reduce lorazepam to 1/2 mg AM and 1 mg PM.  Has had a few panic attacks over the last few months.  Car market is bad bc high prices.  Wife says he's forgetful.   Rarely obsesses if triggered.   Sleep is OK at night.  Plan: Trial reduce olanzapine 1/2 of 5 mg tablet at night to see if balance is any better.  Disc risk.  Continue sertraline 300, Ambien and lorazepam. Use LED of Ambien and try reducing if possible.  04/24/21 appt noted:  seen with wife Joshua Eaton Never tried reducing olanzapine below 5 mg HS. Memory is ok somewhat but trouble with low back pain and hard to start walking. No recent falls.  One of them caused him to hit head on car.   SP mild Covid. Sleep not good the last few weeks but was good until lately.  May nap in the morning. Sometimes forgets am meds.  But uses pill box.  W says he's more forgetful.   Not using CPAP.   Plan: Plan no med changes Continue sertraline 300 mg daily,  olanzapine 5 mg nightly and lorazepam 1 mg twice daily and zolpidem 10 mg nightly. Use CPAP  10/21/21 appt noted: seen with wife Joshua Eaton.  Caffeine some.  Mother also had a Joshua.   Wonders if Joshua related to meds. A little frustrated with things lately.   Son lost his license for 2 years with DUI.    Past Psychiatric Medication Trials:   Xanax, lorazepam, gabapentin, Ambien, temazepam, trazodone, mirtazapine, quetiapine 25, Geodon, Olanzapine, risperidone 0.25, fluvoxamine, sertraline 300, fluoxetine 40,     topiramate 50,  Review of Systems:  Review of Systems  Constitutional:  Negative for appetite change and unexpected weight change.  Eyes:  Positive for visual disturbance.  Musculoskeletal:  Positive for back pain.  Neurological:  Positive for dizziness, tremors and weakness.       Balance trouble ongoing  Psychiatric/Behavioral:  Negative for agitation, behavioral problems, confusion, decreased concentration, dysphoric mood, hallucinations, self-injury, sleep disturbance and suicidal ideas. The patient is nervous/anxious. The patient is not hyperactive.    Medications: I have reviewed the patient's current medications.  Current Outpatient Medications  Medication Sig Dispense Refill   Multiple Vitamins-Minerals (ICAPS AREDS 2) CAPS Take 1 capsule by mouth 2 (two) times daily. For eye care     LORazepam (ATIVAN) 1 MG tablet 1/2 tablet in the morning and 1 tablet at night. 135 tablet 1   OLANZapine (ZYPREXA) 5 MG tablet Take 1 tablet (5 mg total) by mouth at bedtime. 90 tablet 1   sertraline (ZOLOFT) 100 MG tablet TAKE 2 TABLETS BY MOUTH EVERY MORNING AND 1 TABLET IN THE EVENING 270 tablet 1   zolpidem (AMBIEN) 10 MG tablet TAKE 1 TABLET BY MOUTH EVERYDAY AT BEDTIME 30 tablet 5   No current facility-administered medications for this visit.    Medication Side Effects: Other: weight  Allergies:  Allergies  Allergen Reactions   Codeine      "sends me into orbit"    Past Medical History:  Diagnosis Date   Anxiety    Cancer ([place])    Depression    Hypertension    OCD (obsessive compulsive disorder)     History reviewed. No pertinent family history.  Social History   Socioeconomic History   Marital status: Married    Spouse name: Not on file   Number of children: Not on file   Years of education: Not on file   Highest education level: Not on file  Occupational History   Not on file  Tobacco Use   Smoking status: Never   Smokeless tobacco: Never  Substance and Sexual Activity   Alcohol use: No   Drug use: No  Sexual activity: Not on file  Other Topics Concern   Not on file  Social History Narrative   Not on file   Social Determinants of Health   Financial Resource Strain: Not on file  Food Insecurity: Not on file  Transportation Needs: Not on file  Physical Activity: Not on file  Stress: Not on file  Social Connections: Not on file  Intimate Partner Violence: Not on file    Past Medical History, Surgical history, Social history, and Family history were reviewed and updated as appropriate.   Please see review of systems for further details on the patient's review from today.   Objective:   Physical Exam:  There were no vitals taken for this visit.  Physical Exam Constitutional:      Appearance: He is obese.  Neurological:     Mental Status: He is alert.     Sensory: No sensory deficit.     Gait: Gait normal.  Psychiatric:        Attention and Perception: Attention and perception normal.        Mood and Affect: Mood is anxious. Mood is not depressed.        Speech: Speech normal.        Behavior: Behavior is cooperative.        Thought Content: Thought content is not paranoid or delusional. Thought content does not include homicidal or suicidal ideation.        Cognition and Memory: Cognition normal.     Comments: Chronic anxiety waxes and wanes and overall much better than when he first  came here. Fair insight and judgment. Chronic OCD still better than before Zoloft. Talkative per usual. But not a good historian.    Lab Review:     Component Value Date/Time   NA 138 02/03/2016 1708   K 3.5 02/03/2016 1708   CL 110 02/03/2016 1708   CO2 19 (L) 02/03/2016 1708   GLUCOSE 212 (H) 02/03/2016 1708   BUN 21 (H) 02/03/2016 1708   CREATININE 1.02 02/03/2016 1708   CALCIUM 8.9 02/03/2016 1708   PROT 6.6 02/03/2016 1708   ALBUMIN 3.8 02/03/2016 1708   AST 23 02/03/2016 1708   ALT 24 02/03/2016 1708   ALKPHOS 49 02/03/2016 1708   BILITOT 0.6 02/03/2016 1708   GFRNONAA >60 02/03/2016 1708   GFRAA >60 02/03/2016 1708       Component Value Date/Time   WBC 10.1 02/03/2016 1708   RBC 5.11 02/03/2016 1708   HGB 16.3 02/03/2016 1708   HCT 45.4 02/03/2016 1708   PLT 138 (L) 02/03/2016 1708   MCV 88.8 02/03/2016 1708   MCH 31.9 02/03/2016 1708   MCHC 35.9 02/03/2016 1708   RDW 13.1 02/03/2016 1708   LYMPHSABS 2.1 10/06/2015 0500   MONOABS 1.1 (H) 10/06/2015 0500   EOSABS 0.1 10/06/2015 0500   BASOSABS 0.1 10/06/2015 0500    No results found for: POCLITH, LITHIUM   No results found for: PHENYTOIN, PHENOBARB, VALPROATE, CBMZ   .res Assessment: Plan:    Dyllen was seen today for follow-up and anxiety.  Diagnoses and all orders for this visit:  Mixed obsessional thoughts and acts -     sertraline (ZOLOFT) 100 MG tablet; TAKE 2 TABLETS BY MOUTH EVERY MORNING AND 1 TABLET IN THE EVENING -     OLANZapine (ZYPREXA) 5 MG tablet; Take 1 tablet (5 mg total) by mouth at bedtime. -     LORazepam (ATIVAN) 1 MG tablet; 1/2 tablet in the  morning and 1 tablet at night.  Panic disorder with agoraphobia  Depression, major, recurrent, in partial remission ([place])  Insomnia due to other mental disorder -     zolpidem (AMBIEN) 10 MG tablet; TAKE 1 TABLET BY MOUTH EVERYDAY AT BEDTIME  Obstructive sleep apnea  SSRI sexual dysfunction  Greater than 50% of 30 min  face to face  time with patient was spent on counseling and coordination of care. We discussed the following:  Residual obsession are a lot better but not gone. I don't think memory problems are med related but related to not using CPAP. Disc CPAP use and ways to improve mask sealing. He's not using it consistently lately. Disc importance of compliance for brain health.  He needs to contact [place] care and make an appt with them about it.  Sleep poor bc of it.  Sleep hygiene poor with TV on and explained to him.  He's unlikely to change. He's much better with meds.    USE C PAP FOR MEMORY, HEART HEALTH.  He probably will not comply. Disc caffeine.    Disc high dose sertraline and article supporting high dosage for TR OCD.  Disc risk serotonin syndrome.  Disc SSRI WD as cause for dizziness.  Advise against reduction from the high dosage because it is clearly helped his anxiety and depression.  His anxiety and depression are the best it has been in years.. Might be contributing to Joshua.  Extensive discussion of timing of olanzapine and Ambien which need to be separated to have most benefit for sleep.  Not sure he's doing this correctly.  Continue sertraline but ok trial 250 mg daily to see if Joshua is better (Reduce sertraline to 1 and 1/2 tablets in the AM and 1 tablet in the PM) Disc risk of OCD and anxiety getting worse  olanzapine 5 mg nightly and lorazepam 1 mg twice daily and zolpidem 10 mg nightly.  Discussed potential metabolic side effects associated with atypical antipsychotics, as well as potential risk for movement side effects. Advised pt to contact office if movement side effects occur. Getting benefit from olanzapine markedly and he agrees.  We discussed the short-term risks associated with benzodiazepines including sedation and increased fall risk among others.  Discussed long-term side effect risk including dependence, potential withdrawal symptoms, and the potential eventual  dose-related risk of dementia.  Encourage walking and weight loss. Encourage see PCP about weakness and falls.  Disc good RX witih med changes.  This appt was 30 mins.  FU 3 mos  Lynder Parents, MD, DFAPA    Please see After Visit Summary for patient specific instructions.  No future appointments.  No orders of the defined types were placed in this encounter.      -------------------------------

## 2021-10-21 NOTE — Patient Instructions (Signed)
Reduce sertraline to 1 and 1/2 tablets in the AM and 1 tablet in the PM

## 2021-10-22 ENCOUNTER — Other Ambulatory Visit: Payer: Self-pay | Admitting: Psychiatry

## 2021-10-22 DIAGNOSIS — F422 Mixed obsessional thoughts and acts: Secondary | ICD-10-CM

## 2021-10-26 ENCOUNTER — Other Ambulatory Visit: Payer: Self-pay | Admitting: Psychiatry

## 2021-10-26 DIAGNOSIS — F422 Mixed obsessional thoughts and acts: Secondary | ICD-10-CM

## 2021-11-17 ENCOUNTER — Other Ambulatory Visit: Payer: Self-pay | Admitting: Psychiatry

## 2021-11-17 DIAGNOSIS — F422 Mixed obsessional thoughts and acts: Secondary | ICD-10-CM

## 2022-01-16 ENCOUNTER — Encounter: Payer: Self-pay | Admitting: Nurse Practitioner

## 2022-01-20 ENCOUNTER — Ambulatory Visit (INDEPENDENT_AMBULATORY_CARE_PROVIDER_SITE_OTHER): Payer: Medicare Other | Admitting: Psychiatry

## 2022-01-20 ENCOUNTER — Encounter: Payer: Self-pay | Admitting: Psychiatry

## 2022-01-20 DIAGNOSIS — F422 Mixed obsessional thoughts and acts: Secondary | ICD-10-CM

## 2022-01-20 DIAGNOSIS — F5105 Insomnia due to other mental disorder: Secondary | ICD-10-CM

## 2022-01-20 DIAGNOSIS — F3341 Major depressive disorder, recurrent, in partial remission: Secondary | ICD-10-CM | POA: Diagnosis not present

## 2022-01-20 DIAGNOSIS — F99 Mental disorder, not otherwise specified: Secondary | ICD-10-CM

## 2022-01-20 DIAGNOSIS — F4001 Agoraphobia with panic disorder: Secondary | ICD-10-CM | POA: Diagnosis not present

## 2022-01-20 DIAGNOSIS — G4733 Obstructive sleep apnea (adult) (pediatric): Secondary | ICD-10-CM

## 2022-01-20 DIAGNOSIS — R251 Tremor, unspecified: Secondary | ICD-10-CM

## 2022-01-20 MED ORDER — PROPRANOLOL HCL 20 MG PO TABS: 20.0000 mg | ORAL_TABLET | Freq: Two times a day (BID) | ORAL | 0 refills | Status: DC | PRN

## 2022-01-20 NOTE — Progress Notes (Signed)
Joshua Eaton [id number] 1956/08/21 66 y.o.  Subjective:   Patient ID:  Joshua Eaton is a 66 y.o. (DOB 22-Nov-1955) male.  Chief Complaint:  Chief Complaint  Patient presents with   Follow-up   Anxiety   Other    tremor    HPI  Kamran Coker presents to the office today for follow-up of severe TR anxiety and OCD and med problems.    seen October, 2020.  No med changes were made.   10/24/19 appt with the following noted:  No med changes Still Good and bad days. Bad days doesn't want to do things.  Can't find much activity.  Bored and not doing much.  Anxiety 7-8/10 with 10 good.  Depression is rated the same as anxiety which is all much improved. No problems with the meds. Except is forgetful per wife.   Still has chronic anxiety  But it's better than before the meds he's currently on.  Chronic fear of insomnia.  Appetite is very strong with the olanzapine.  Sleep is variable.  Last night good but bad last week.  He is not depressed.  He is not having mood swings or unusual irritability or anger.  Still has some rigidity and is easily anxious and highly avoidant and poorly functional overall.  He can enjoy some things.  Tries to stay active.  Going to [employer].  Worry over the country. Chronic Problems with CPAP.  Sleep not great as usual but sleeps with TV in bedroom.    Only 3-4 hours of sleep.  Using [place].  Doesn't think it's humidiyfing.  Has not talked to them about it yet.    CO poor sleep and still having problems with the mask of CPAP.  Has nasal pillows recently.  Leaks some still.   Stopped gabapentin with no problems. Weight down from 287 to 262#.  04/22/20 appt with the following noted: Days when doesn't feel right but otherwise anxiety is managed well.   Usually Sleep OK but not always. Favorite car is [employer], community and enjoys them.  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 Good interest. Still has occ obsessions but can usually dismiss easily. Patient  reports stable mood and denies depressed or irritable moods.  Patient denies any recent difficulty with anxiety. Some chronic issues with CPAP and sleep. Denies appetite disturbance.  Patient reports that energy and motivation have been good.  Patient denies any difficulty with concentration.  Patient denies any suicidal ideation. No Ambien amnesia. No sex in 44 mos bc difficulty.  Wonders about meds affecting.   Plan no med changes Continue sertraline 300 mg daily, olanzapine 5 mg nightly and lorazepam 1 mg twice daily and zolpidem 10 mg nightly.  10/21/2020 appt with following noted:  Seen with wife Joshua Eaton Situational stressors.  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 Not as sensitive with caffeine. Missed Ativan 2 days and felt it.  Otherwise been OK with meds.   Consistent. Still balance problems.   No numbness in feet.   Has macular degeneration and history of repeated ear infections. Has been able to reduce lorazepam to 1/2 mg AM and 1 mg PM.  Has had a few panic attacks over the last few months.  Car market is bad bc high prices.  Wife says he's forgetful.   Rarely obsesses if triggered.   Sleep is OK at night.   Plan: Trial reduce olanzapine  1/2 of 5 mg tablet at night to see if balance is any better.  Disc risk.  Continue sertraline 300, Ambien and lorazepam. Use LED of Ambien and try reducing if possible.  04/24/21 appt noted:  seen with wife Joshua Eaton Never tried reducing olanzapine below 5 mg HS. Memory is ok somewhat but trouble with low back pain and hard to start walking. No recent falls.  One of them caused him to hit head on car.   SP mild Covid. Sleep not good the last few weeks but was good until lately.  May nap in the morning. Sometimes forgets am meds.  But uses pill box.  W says he's more forgetful.   Not using CPAP.   Plan: Plan no med changes Continue sertraline 300 mg daily, olanzapine 5 mg nightly and lorazepam 1 mg twice  daily and zolpidem 10 mg nightly. Use CPAP  10/21/21 appt noted: seen with wife Tremor with intention close to a year.  Caffeine some.  Mother also had a tremor.   Wonders if tremor related to meds. A little frustrated with things lately.   Son lost his license for 2 years with DUI.   OCD manageable and wants to reduce sertraline to see if tremor will be better Plan: Continue sertraline but ok trial 250 mg daily to see if tremor is better (Reduce sertraline to 1 and 1/2 tablets in the AM and 1 tablet in the PM)  01/20/22 appt noted: Pretty much feel the same as I did.  Some occ obs thoughts but generally lets them go. Can't tell any difference at all in the tremor with less sertraline. Macular degeneration affects him more than anything. Limited coffee. Anxiety managed.     Past Psychiatric Medication Trials:   Xanax, lorazepam, gabapentin, Ambien, temazepam, trazodone, mirtazapine, quetiapine 25, Geodon, Olanzapine, risperidone 0.25, fluvoxamine, sertraline 300, fluoxetine 40,     topiramate 50,  M had severe tremor  Review of Systems:  Review of Systems  Constitutional:  Negative for appetite change and unexpected weight change.  Eyes:  Positive for visual disturbance.  Musculoskeletal:  Positive for back pain.  Neurological:  Positive for dizziness, tremors and weakness.       Balance trouble ongoing  Psychiatric/Behavioral:  Negative for agitation, behavioral problems, confusion, decreased concentration, dysphoric mood, hallucinations, self-injury, sleep disturbance and suicidal ideas. The patient is nervous/anxious. The patient is not hyperactive.    Medications: I have reviewed the patient's current medications.  Current Outpatient Medications  Medication Sig Dispense Refill   LORazepam (ATIVAN) 1 MG tablet 1/2 tablet in the morning and 1 tablet at night. 135 tablet 1   Multiple Vitamins-Minerals (ICAPS AREDS 2) CAPS Take 1 capsule by mouth 2 (two) times daily. For eye  care     OLANZapine (ZYPREXA) 5 MG tablet Take 1 tablet (5 mg total) by mouth at bedtime. 90 tablet 1   propranolol (INDERAL) 20 MG tablet Take 1-2 tablets (20-40 mg total) by mouth 2 (two) times daily as needed. 100 tablet 0   sertraline (ZOLOFT) 100 MG tablet TAKE 2 TABLETS BY MOUTH EVERY MORNING AND 1 TABLET IN THE EVENING 270 tablet 1   zolpidem (AMBIEN) 10 MG tablet TAKE 1 TABLET BY MOUTH EVERYDAY AT BEDTIME 30 tablet 5   No current facility-administered medications for this visit.    Medication Side Effects: Other: weight  Allergies:  Allergies  Allergen Reactions   Codeine     "sends me into orbit"    Past Medical History:  Diagnosis Date   Anxiety    Cancer ([place])    Depression    Hypertension    OCD (obsessive compulsive disorder)     History reviewed. No pertinent family history.  Social History   Socioeconomic History   Marital status: Married    Spouse name: Not on file   Number of children: Not on file   Years of education: Not on file   Highest education level: Not on file  Occupational History   Not on file  Tobacco Use   Smoking status: Never   Smokeless tobacco: Never  Substance and Sexual Activity   Alcohol use: No   Drug use: No   Sexual activity: Not on file  Other Topics Concern   Not on file  Social History Narrative   Not on file   Social Determinants of Health   Financial Resource Strain: Not on file  Food Insecurity: Not on file  Transportation Needs: Not on file  Physical Activity: Not on file  Stress: Not on file  Social Connections: Not on file  Intimate Partner Violence: Not on file    Past Medical History, Surgical history, Social history, and Family history were reviewed and updated as appropriate.   Please see review of systems for further details on the patient's review from today.   Objective:   Physical Exam:  There were no vitals taken for this visit.  Physical Exam Constitutional:      Appearance: He is  obese.  Neurological:     Mental Status: He is alert.     Sensory: No sensory deficit.     Gait: Gait normal.  Psychiatric:        Attention and Perception: Attention and perception normal.        Mood and Affect: Mood is anxious. Mood is not depressed.        Speech: Speech normal.        Behavior: Behavior is cooperative.        Thought Content: Thought content is not paranoid or delusional. Thought content does not include homicidal or suicidal ideation.        Cognition and Memory: Cognition normal.     Comments: Chronic anxiety waxes and wanes and overall much better than when he first came here. Fair insight and judgment. Chronic OCD still better than before Zoloft. Talkative per usual. But not a good historian.    Lab Review:     Component Value Date/Time   NA 138 02/03/2016 1708   K 3.5 02/03/2016 1708   CL 110 02/03/2016 1708   CO2 19 (L) 02/03/2016 1708   GLUCOSE 212 (H) 02/03/2016 1708   BUN 21 (H) 02/03/2016 1708   CREATININE 1.02 02/03/2016 1708   CALCIUM 8.9 02/03/2016 1708   PROT 6.6 02/03/2016 1708   ALBUMIN 3.8 02/03/2016 1708   AST 23 02/03/2016 1708   ALT 24 02/03/2016 1708   ALKPHOS 49 02/03/2016 1708   BILITOT 0.6 02/03/2016 1708   GFRNONAA >60 02/03/2016 1708   GFRAA >60 02/03/2016 1708       Component Value Date/Time   WBC 10.1 02/03/2016 1708   RBC 5.11 02/03/2016 1708   HGB 16.3 02/03/2016 1708   HCT 45.4 02/03/2016 1708   PLT 138 (L) 02/03/2016 1708   MCV 88.8 02/03/2016 1708   MCH 31.9 02/03/2016 1708   MCHC 35.9 02/03/2016 1708   RDW 13.1 02/03/2016 1708   LYMPHSABS 2.1 10/06/2015 0500   MONOABS 1.1 (H) 10/06/2015 0500  EOSABS 0.1 10/06/2015 0500   BASOSABS 0.1 10/06/2015 0500    No results found for: POCLITH, LITHIUM   No results found for: PHENYTOIN, PHENOBARB, VALPROATE, CBMZ   .res Assessment: Plan:    Cloyce was seen today for follow-up, anxiety and other.  Diagnoses and all orders for this visit:  Mixed obsessional  thoughts and acts  Panic disorder with agoraphobia  Depression, major, recurrent, in partial remission ([place])  Insomnia due to other mental disorder  Obstructive sleep apnea  Tremor of both hands -     propranolol (INDERAL) 20 MG tablet; Take 1-2 tablets (20-40 mg total) by mouth 2 (two) times daily as needed.  SSRI sexual dysfunction  Greater than 50% of 30 min  face to face time with patient was spent on counseling and coordination of care. We discussed the following:  Residual obsession are a lot better but not gone. I don't think memory problems are med related but related to not using CPAP. Disc CPAP use and ways to improve mask sealing. He's not using it consistently lately. Disc importance of compliance for brain health.  He needs to contact [facility] and make an appt with them about it.  Sleep poor bc of it.  Sleep hygiene poor with TV on and explained to him.  He's unlikely to change. He's much better with meds.    USE C PAP FOR MEMORY, HEART HEALTH.  He probably will not comply. Disc caffeine.    Disc high dose sertraline and article supporting high dosage for TR OCD.  Disc risk serotonin syndrome.  Disc SSRI WD as cause for dizziness.  Advise against reduction from the high dosage because it is clearly helped his anxiety and depression.  His anxiety and depression are the best it has been in years.. Might be contributing to tremor. Might have essential tremor bc mother had tremor to or could be related to olanzapine. Ok trial reduction in olanzapine 2.5 mg HS.   Disc risk worsening OCD  and anxiety and if so increase dose If tremor is not better call and we'll try propranolol 20-40 mg twice daily  Extensive discussion of timing of olanzapine and Ambien which need to be separated to have most benefit for sleep.  Not sure he's doing this correctly.  Continue sertraline  250 mg daily but  tremor is not better but anxiety no worse. Disc risk of OCD and anxiety getting  worse  lorazepam 1 mg twice daily and zolpidem 10 mg nightly.  Discussed potential metabolic side effects associated with atypical antipsychotics, as well as potential risk for movement side effects. Advised pt to contact office if movement side effects occur. Getting benefit from olanzapine markedly and he agrees.  We discussed the short-term risks associated with benzodiazepines including sedation and increased fall risk among others.  Discussed long-term side effect risk including dependence, potential withdrawal symptoms, and the potential eventual dose-related risk of dementia.  Encourage walking and weight loss. Encourage see PCP about weakness and falls.  Disc good RX witih med changes.  This appt was 30 mins.  FU 3 mos  Lynder Parents, MD, DFAPA    Please see After Visit Summary for patient specific instructions.  No future appointments.   No orders of the defined types were placed in this encounter.      -------------------------------

## 2022-01-30 ENCOUNTER — Other Ambulatory Visit: Payer: Self-pay | Admitting: Psychiatry

## 2022-01-30 DIAGNOSIS — F422 Mixed obsessional thoughts and acts: Secondary | ICD-10-CM

## 2022-02-02 ENCOUNTER — Other Ambulatory Visit: Payer: Self-pay | Admitting: Psychiatry

## 2022-02-02 DIAGNOSIS — F422 Mixed obsessional thoughts and acts: Secondary | ICD-10-CM

## 2022-02-04 ENCOUNTER — Other Ambulatory Visit: Payer: Self-pay

## 2022-02-04 DIAGNOSIS — R251 Tremor, unspecified: Secondary | ICD-10-CM

## 2022-02-04 MED ORDER — PROPRANOLOL HCL 20 MG PO TABS: 20.0000 mg | ORAL_TABLET | Freq: Two times a day (BID) | ORAL | 0 refills | Status: DC | PRN

## 2022-02-26 ENCOUNTER — Other Ambulatory Visit: Payer: Self-pay | Admitting: Psychiatry

## 2022-02-26 DIAGNOSIS — F5105 Insomnia due to other mental disorder: Secondary | ICD-10-CM

## 2022-03-13 ENCOUNTER — Other Ambulatory Visit: Payer: Self-pay | Admitting: Psychiatry

## 2022-03-13 DIAGNOSIS — F5105 Insomnia due to other mental disorder: Secondary | ICD-10-CM

## 2022-03-13 DIAGNOSIS — F99 Mental disorder, not otherwise specified: Secondary | ICD-10-CM

## 2022-03-16 NOTE — Telephone Encounter (Signed)
Last filled 6/26, due 7/24

## 2022-03-18 NOTE — Telephone Encounter (Signed)
Nadav called to check on status of refill of his refill for Zolpidem.  I told him we had the request and were processing it.  I see it is not due until 7/24.  Note that [facility] is a mail order.

## 2022-04-08 ENCOUNTER — Other Ambulatory Visit: Payer: Self-pay | Admitting: Psychiatry

## 2022-04-08 DIAGNOSIS — R251 Tremor, unspecified: Secondary | ICD-10-CM

## 2022-04-17 ENCOUNTER — Other Ambulatory Visit: Payer: Self-pay | Admitting: Psychiatry

## 2022-04-17 DIAGNOSIS — F422 Mixed obsessional thoughts and acts: Secondary | ICD-10-CM

## 2022-05-28 ENCOUNTER — Ambulatory Visit: Payer: Medicare Other | Admitting: Psychiatry

## 2022-07-20 ENCOUNTER — Encounter: Payer: Self-pay | Admitting: Psychiatry

## 2022-07-20 ENCOUNTER — Ambulatory Visit (INDEPENDENT_AMBULATORY_CARE_PROVIDER_SITE_OTHER): Payer: Medicare Other | Admitting: Psychiatry

## 2022-07-20 DIAGNOSIS — F422 Mixed obsessional thoughts and acts: Secondary | ICD-10-CM

## 2022-07-20 MED ORDER — OLANZAPINE 2.5 MG PO TABS: 2.5000 mg | ORAL_TABLET | Freq: Every day | ORAL | 1 refills | Status: DC

## 2022-07-20 MED ORDER — LORAZEPAM 1 MG PO TABS: ORAL_TABLET | ORAL | 1 refills | Status: DC

## 2022-07-20 NOTE — Patient Instructions (Addendum)
Reduce olanzapine to 2.5 mg nightly .  Sent in RX for that dose to [facility].

## 2022-07-20 NOTE — Progress Notes (Signed)
Joshua Eaton [id number] [birth year] 66 y.o.  Subjective:   Patient ID:  Joshua Eaton is a 66 y.o. (DOB 07-07-1956) male.  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    seen October, 2020.  No med changes were made.   10/24/19 appt with the following noted:  No med changes Still Good and bad days. Bad days doesn't want to do things.  Can't find much activity.  Bored and not doing much.  Anxiety 7-8/10 with 10 good.  Depression is rated the same as anxiety which is all much improved. No problems with the meds. Except is forgetful per wife.   Still has chronic anxiety  But it's better than before the meds he's currently on.  Chronic fear of insomnia.  Appetite is very strong with the olanzapine.  Sleep is variable.  Last night good but bad last week.  He is not depressed.  He is not having mood swings or unusual irritability or anger.  Still has some rigidity and is easily anxious and highly avoidant and poorly functional overall.  He can enjoy some things.  Tries to stay active.  Going to [employer].  Worry over the country. Chronic Problems with CPAP.  Sleep not great as usual but sleeps with TV in bedroom.    Only 3-4 hours of sleep.  Using [place].  Doesn't think it's humidiyfing.  Has not talked to them about it yet.    CO poor sleep and still having problems with the mask of CPAP.  Has nasal pillows recently.  Leaks some still.   Stopped gabapentin with no problems. Weight down from 287 to 262#.  04/22/20 appt with the following noted: Days when doesn't feel right but otherwise anxiety is managed well.   Usually Sleep OK but not always. Favorite car is [employer], community and enjoys them.  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 Good interest. Still has occ obsessions but can  usually dismiss easily. Patient reports stable mood and denies depressed or irritable moods.  Patient denies any recent difficulty with anxiety. Some chronic issues with CPAP and sleep. Denies appetite disturbance.  Patient reports that energy and motivation have been good.  Patient denies any difficulty with concentration.  Patient denies any suicidal ideation. No Ambien amnesia. No sex in 7 mos bc difficulty.  Wonders about meds affecting.   Plan no med changes Continue sertraline 300 mg daily, olanzapine 5 mg nightly and lorazepam 1 mg twice daily and zolpidem 10 mg nightly.  10/21/2020 appt with following noted:  Seen with wife Joshua Eaton Situational stressors.  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 Not as sensitive with caffeine. Missed Ativan 2 days and felt it.  Otherwise been OK with meds.   Consistent. Still balance problems.   No numbness in feet.   Has macular degeneration and history of repeated ear infections. Has been able to reduce lorazepam to 1/2 mg AM and 1 mg PM.  Has had a few panic attacks over the last few months.  Car market is bad bc high prices.  Wife says he's forgetful.   Rarely obsesses if triggered.   Sleep is OK at night.  Plan: Trial reduce olanzapine 1/2 of 5 mg tablet at night to see if balance is any better.  Disc risk.  Continue sertraline 300, Ambien and lorazepam. Use LED of Ambien and try reducing if possible.  04/24/21 appt noted:  seen with wife Joshua Eaton Never tried reducing olanzapine below 5 mg HS. Memory is ok somewhat but trouble with low back pain and hard to start walking. No recent falls.  One of them caused him to hit head on car.   SP mild Covid. Sleep not good the last few weeks but was good until lately.  May nap in the morning. Sometimes forgets am meds.  But uses pill box.  W says he's more forgetful.   Not using CPAP.   Plan: Plan no med changes Continue sertraline 300 mg daily, olanzapine 5 mg  nightly and lorazepam 1 mg twice daily and zolpidem 10 mg nightly. Use CPAP  10/21/21 appt noted: seen with wife Joshua Eaton with intention close to a year.  Caffeine some.  Mother also had a Joshua Eaton.   Wonders if Joshua Eaton related to meds. A little frustrated with things lately.   Son lost his license for 2 years with DUI.   OCD manageable and wants to reduce sertraline to see if Joshua Eaton will be better Plan: Continue sertraline but ok trial 250 mg daily to see if Joshua Eaton is better (Reduce sertraline to 1 and 1/2 tablets in the AM and 1 tablet in the PM)  01/20/22 appt noted: Pretty much feel the same as I did.  Some occ obs thoughts but generally lets them go. Can't tell any difference at all in the Joshua Eaton with less sertraline. Macular degeneration affects him more than anything. Limited coffee. Anxiety managed.   Plan: Ok trial reduction in olanzapine 2.5 mg HS.    07/20/2022 appointment noted: Anxiety has been OK overall.  Still some low back pain issues and affects balance some and some positional dizziness.  No problems driving.   Not depressed.   He's not sure if reduced olazapine to 2.5 mg HS bc wife puts his meds out for him.   No problems with reduction in dose.   Still working some.  Past Psychiatric Medication Trials:   Xanax, lorazepam, gabapentin, Ambien, temazepam, trazodone, mirtazapine, quetiapine 25, Geodon, Olanzapine, risperidone 0.25, fluvoxamine, sertraline 300, fluoxetine 40,     topiramate 50,  M had severe Joshua Eaton  Review of Systems:  Review of Systems  Constitutional:  Negative for appetite change and unexpected weight change.  Eyes:  Positive for visual disturbance.  Musculoskeletal:  Positive for back pain.  Neurological:  Positive for dizziness and tremors. Negative for weakness.       Balance trouble ongoing but looks like positional vertigo  Psychiatric/Behavioral:  Negative for agitation, behavioral problems, confusion, decreased concentration, dysphoric  mood, hallucinations, self-injury, sleep disturbance and suicidal ideas. The patient is not nervous/anxious and is not hyperactive.     Medications: I have reviewed the patient's current medications.  Current Outpatient Medications  Medication Sig Dispense Refill   Multiple Vitamins-Minerals (ICAPS AREDS 2) CAPS Take 1 capsule by mouth 2 (two) times daily. For eye care     propranolol (INDERAL) 20 MG tablet TAKE 1 TO 2 TABLETS BY MOUTH  TWICE DAILY AS NEEDED 360 tablet 3   sertraline (ZOLOFT) 100 MG tablet TAKE 2 TABLETS BY MOUTH IN THE  MORNING AND 1 TABLET BY MOUTH IN THE EVENING (Patient taking differently: TAKE 2 TABLETS BY MOUTH IN THE  MORNING AND  1/2 TABLET BY MOUTH IN THE EVENING) 270 tablet 3   zolpidem (AMBIEN) 10 MG tablet TAKE 1 TABLET BY MOUTH DAILY AT  BEDTIME 30 tablet 4   LORazepam (ATIVAN) 1 MG tablet TAKE ONE-HALF TABLET BY MOUTH IN THE MORNING AND 1 TABLET BY  MOUTH AT NIGHT 135 tablet 1   OLANZapine (ZYPREXA) 2.5 MG tablet Take 1 tablet (2.5 mg total) by mouth at bedtime. 90 tablet 1   No current facility-administered medications for this visit.    Medication Side Effects: Other: weight  Allergies:  Allergies  Allergen Reactions   Codeine     "sends me into orbit"    Past Medical History:  Diagnosis Date   Anxiety    Cancer ([place])    Depression    Hypertension    OCD (obsessive compulsive disorder)     History reviewed. No pertinent family history.  Social History   Socioeconomic History   Marital status: Married    Spouse name: Not on file   Number of children: Not on file   Years of education: Not on file   Highest education level: Not on file  Occupational History   Not on file  Tobacco Use   Smoking status: Never   Smokeless tobacco: Never  Substance and Sexual Activity   Alcohol use: No   Drug use: No   Sexual activity: Not on file  Other Topics Concern   Not on file  Social History Narrative   Not on file   Social Determinants of  Health   Financial Resource Strain: Not on file  Food Insecurity: Not on file  Transportation Needs: Not on file  Physical Activity: Not on file  Stress: Not on file  Social Connections: Not on file  Intimate Partner Violence: Not on file    Past Medical History, Surgical history, Social history, and Family history were reviewed and updated as appropriate.   Please see review of systems for further details on the patient's review from today.   Objective:   Physical Exam:  There were no vitals taken for this visit.  Physical Exam Constitutional:      Appearance: He is obese.  Neurological:     Mental Status: He is alert.     Sensory: No sensory deficit.     Gait: Gait normal.  Psychiatric:        Attention and Perception: Attention and perception normal.        Mood and Affect: Mood is anxious. Mood is not depressed.        Speech: Speech normal.        Behavior: Behavior is cooperative.        Thought Content: Thought content is not paranoid or delusional. Thought content does not include homicidal or suicidal ideation.        Cognition and Memory: Cognition normal.     Comments: Chronic anxiety much better than when he first came here. Fair insight and judgment. Chronic OCD still better than before Zoloft. Talkative per usual.     Lab Review:     Component Value Date/Time   NA 138 02/03/2016 1708   K 3.5 02/03/2016 1708   CL 110 02/03/2016 1708   CO2 19 (L) 02/03/2016 1708   GLUCOSE 212 (H) 02/03/2016 1708   BUN 21 (H) 02/03/2016 1708   CREATININE 1.02 02/03/2016 1708   CALCIUM 8.9 02/03/2016 1708   PROT 6.6 02/03/2016 1708   ALBUMIN 3.8 02/03/2016 1708   AST 23 02/03/2016 1708  ALT 24 02/03/2016 1708   ALKPHOS 49 02/03/2016 1708   BILITOT 0.6 02/03/2016 1708   GFRNONAA >60 02/03/2016 1708   GFRAA >60 02/03/2016 1708       Component Value Date/Time   WBC 10.1 02/03/2016 1708   RBC 5.11 02/03/2016 1708   HGB 16.3 02/03/2016 1708   HCT 45.4  02/03/2016 1708   PLT 138 (L) 02/03/2016 1708   MCV 88.8 02/03/2016 1708   MCH 31.9 02/03/2016 1708   MCHC 35.9 02/03/2016 1708   RDW 13.1 02/03/2016 1708   LYMPHSABS 2.1 10/06/2015 0500   MONOABS 1.1 (H) 10/06/2015 0500   EOSABS 0.1 10/06/2015 0500   BASOSABS 0.1 10/06/2015 0500    No results found for: "POCLITH", "LITHIUM"   No results found for: "PHENYTOIN", "PHENOBARB", "VALPROATE", "CBMZ"   .res Assessment: Plan:    Nobel was seen today for follow-up, depression and anxiety.  Diagnoses and all orders for this visit:  Mixed obsessional thoughts and acts -     OLANZapine (ZYPREXA) 2.5 MG tablet; Take 1 tablet (2.5 mg total) by mouth at bedtime. -     LORazepam (ATIVAN) 1 MG tablet; TAKE ONE-HALF TABLET BY MOUTH IN THE MORNING AND 1 TABLET BY  MOUTH AT NIGHT  SSRI sexual dysfunction  Greater than 50% of 30 min  face to face time with patient was spent on counseling and coordination of care. We discussed the following:  Residual obsession are a lot better but not gone. I don't think memory problems are med related but related to not using CPAP. Disc CPAP use and ways to improve mask sealing. He's not using it consistently lately. Disc importance of compliance for brain health.  He needs to contact [facility] and make an appt with them about it.  Sleep poor bc of it.  Sleep hygiene poor with TV on and explained to him.  He's unlikely to change. He's much better with meds.    USE C PAP FOR MEMORY, HEART HEALTH.  He probably will not comply. Disc caffeine.    Disc high dose sertraline and article supporting high dosage for TR OCD.  Disc risk serotonin syndrome.  Disc SSRI WD as cause for dizziness.  Advise against reduction from the high dosage because it is clearly helped his anxiety and depression.  His anxiety and depression are the best it has been in years.. Might be contributing to Joshua Eaton. Might have essential Joshua Eaton bc mother had Joshua Eaton to or could be related to  olanzapine. continue reduction in olanzapine 2.5 mg HS.   Disc risk worsening OCD  and anxiety and if so increase dose If Joshua Eaton is not better call and we'll try propranolol 20-40 mg twice daily  Extensive discussion of timing of olanzapine and Ambien which need to be separated to have most benefit for sleep.  Not sure he's doing this correctly.  Continue sertraline  250 mg daily but  Joshua Eaton is not better but anxiety no worse. Disc risk of OCD and anxiety getting worse  lorazepam 0.5-1 mg twice daily and zolpidem 10 mg nightly.  Discussed potential metabolic side effects associated with atypical antipsychotics, as well as potential risk for movement side effects. Advised pt to contact office if movement side effects occur. Getting benefit from olanzapine markedly and he agrees.  We discussed the short-term risks associated with benzodiazepines including sedation and increased fall risk among others.  Discussed long-term side effect risk including dependence, potential withdrawal symptoms, and the potential eventual dose-related risk of  dementia.  This appt was 30 mins.  FU 3 mos  Lynder Parents, MD, DFAPA    Please see After Visit Summary for patient specific instructions.  Future Appointments  Date Time Provider [place]  08/18/2022 10:30 AM Cottle, Billey Co., MD [facility] None     No orders of the defined types were placed in this encounter.      -------------------------------

## 2022-08-18 ENCOUNTER — Ambulatory Visit: Payer: Medicare Other | Admitting: Psychiatry

## 2022-08-29 ENCOUNTER — Other Ambulatory Visit: Payer: Self-pay | Admitting: Psychiatry

## 2022-08-29 DIAGNOSIS — F5105 Insomnia due to other mental disorder: Secondary | ICD-10-CM

## 2022-12-05 ENCOUNTER — Other Ambulatory Visit: Payer: Self-pay | Admitting: Psychiatry

## 2022-12-05 DIAGNOSIS — F422 Mixed obsessional thoughts and acts: Secondary | ICD-10-CM

## 2023-01-20 ENCOUNTER — Encounter: Payer: Self-pay | Admitting: Psychiatry

## 2023-01-20 ENCOUNTER — Other Ambulatory Visit: Payer: Self-pay | Admitting: Psychiatry

## 2023-01-20 ENCOUNTER — Ambulatory Visit: Payer: Medicare Other | Admitting: Psychiatry

## 2023-01-20 DIAGNOSIS — F422 Mixed obsessional thoughts and acts: Secondary | ICD-10-CM

## 2023-01-20 DIAGNOSIS — R251 Tremor, unspecified: Secondary | ICD-10-CM

## 2023-01-20 DIAGNOSIS — F4001 Agoraphobia with panic disorder: Secondary | ICD-10-CM | POA: Diagnosis not present

## 2023-01-20 DIAGNOSIS — G4733 Obstructive sleep apnea (adult) (pediatric): Secondary | ICD-10-CM

## 2023-01-20 DIAGNOSIS — F5105 Insomnia due to other mental disorder: Secondary | ICD-10-CM | POA: Diagnosis not present

## 2023-01-20 DIAGNOSIS — F3341 Major depressive disorder, recurrent, in partial remission: Secondary | ICD-10-CM

## 2023-01-20 DIAGNOSIS — F99 Mental disorder, not otherwise specified: Secondary | ICD-10-CM

## 2023-01-20 MED ORDER — OLANZAPINE 2.5 MG PO TABS: 2.5000 mg | ORAL_TABLET | Freq: Every day | ORAL | 1 refills | Status: DC

## 2023-01-20 MED ORDER — LORAZEPAM 1 MG PO TABS: ORAL_TABLET | ORAL | 1 refills | Status: DC

## 2023-01-20 MED ORDER — ZOLPIDEM TARTRATE 10 MG PO TABS: 10.0000 mg | ORAL_TABLET | Freq: Every day | ORAL | 5 refills | Status: DC

## 2023-01-20 NOTE — Progress Notes (Signed)
Joshua Eaton [id number] 1956-05-17 67 y.o.  Subjective:   Patient ID:  Joshua Eaton is a 67 y.o. (DOB 11-17-1955) male.  Chief Complaint:  Chief Complaint  Patient presents with   Follow-up   Anxiety   Sleeping Problem    HPI  Joshua Eaton presents to the office today for follow-up of severe TR anxiety and OCD and med problems.    seen October, 2020.  No med changes were made.   10/24/19 appt with the following noted:  No med changes Still Good and bad days. Bad days doesn't want to do things.  Can't find much activity.  Bored and not doing much.  Anxiety 7-8/10 with 10 good.  Depression is rated the same as anxiety which is all much improved. No problems with the meds. Except is forgetful per wife.   Still has chronic anxiety  But it's better than before the meds he's currently on.  Chronic fear of insomnia.  Appetite is very strong with the olanzapine.  Sleep is variable.  Last night good but bad last week.  He is not depressed.  He is not having mood swings or unusual irritability or anger.  Still has some rigidity and is easily anxious and highly avoidant and poorly functional overall.  He can enjoy some things.  Tries to stay active.  Going to [employer].  Worry over the country. Chronic Problems with CPAP.  Sleep not great as usual but sleeps with TV in bedroom.    Only 3-4 hours of sleep.  Using [facility].  Doesn't think it's humidiyfing.  Has not talked to them about it yet.    CO poor sleep and still having problems with the mask of CPAP.  Has nasal pillows recently.  Leaks some still.   Stopped gabapentin with no problems. Weight down from 287 to 262#.  04/22/20 appt with the following noted: Days when doesn't feel right but otherwise anxiety is managed well.   Usually Sleep OK but not always. Favorite car is [employer] and enjoys them.  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 Good interest. Still has occ obsessions but can usually dismiss easily. Patient  reports stable mood and denies depressed or irritable moods.  Patient denies any recent difficulty with anxiety. Some chronic issues with CPAP and sleep. Denies appetite disturbance.  Patient reports that energy and motivation have been good.  Patient denies any difficulty with concentration.  Patient denies any suicidal ideation. No Ambien amnesia. No sex in 18 mos bc difficulty.  Wonders about meds affecting.   Plan no med changes Continue sertraline 300 mg daily, olanzapine 5 mg nightly and lorazepam 1 mg twice daily and zolpidem 10 mg nightly.  10/21/2020 appt with following noted:  Seen with wife Joshua Eaton Situational stressors.  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 Not as sensitive with caffeine. Missed Ativan 2 days and felt it.  Otherwise been OK with meds.   Consistent. Still balance problems.   No numbness in feet.   Has macular degeneration and history of repeated ear infections. Has been able to reduce lorazepam to 1/2 mg AM and 1 mg PM.  Has had a few panic attacks over the last few months.  Car market is bad bc high prices.  Wife says he's forgetful.   Rarely obsesses if triggered.   Sleep is OK at night.   Plan: Trial reduce olanzapine 1/2 of 5  mg tablet at night to see if balance is any better.  Disc risk.  Continue sertraline 300, Ambien and lorazepam. Use LED of Ambien and try reducing if possible.  04/24/21 appt noted:  seen with wife Joshua Eaton Never tried reducing olanzapine below 5 mg HS. Memory is ok somewhat but trouble with low back pain and hard to start walking. No recent falls.  One of them caused him to hit head on car.   SP mild Covid. Sleep not good the last few weeks but was good until lately.  May nap in the morning. Sometimes forgets am meds.  But uses pill box.  W says he's more forgetful.   Not using CPAP.   Plan: Plan no med changes Continue sertraline 300 mg daily, olanzapine 5 mg nightly and lorazepam 1 mg twice  daily and zolpidem 10 mg nightly. Use CPAP  10/21/21 appt noted: seen with wife Joshua Eaton with intention close to a year.  Caffeine some.  Mother also had a Joshua Eaton.   Wonders if Joshua Eaton related to meds. A little frustrated with things lately.   Son lost his license for 2 years with DUI.   OCD manageable and wants to reduce sertraline to see if Joshua Eaton will be better Plan: Continue sertraline but ok trial 250 mg daily to see if Joshua Eaton is better (Reduce sertraline to 1 and 1/2 tablets in the AM and 1 tablet in the PM)  01/20/22 appt noted: Pretty much feel the same as I did.  Some occ obs thoughts but generally lets them go. Can't tell any difference at all in the Joshua Eaton with less sertraline. Macular degeneration affects him more than anything. Limited coffee. Anxiety managed.   Plan: Ok trial reduction in olanzapine 2.5 mg HS.    07/20/2022 appointment noted: Anxiety has been OK overall.  Still some low back pain issues and affects balance some and some positional dizziness.  No problems driving.   Not depressed.   He's not sure if reduced olazapine to 2.5 mg HS bc wife puts his meds out for him.   No problems with reduction in dose.   Still working some. Plan no changes  01/20/23 appt noted: Psych med: sertraline 250, lorazepam 1 mg tab 1/2 in AM and 1 HS, olanzapine 2.5 mg HS, Ambien 10 HS, propranolol 20 prn Still selling cars.  Still some Joshua Eaton problems.  Takes propranolol prn with some benefit but doesn't use it all the time. Coffee in the AM 2.  Not depressed.  Anxiety doing pretty good overall.   Sleep is pretty well.  To bed 10 pm No SE noted.   Forgets names. 2 sisters passed away.    Past Psychiatric Medication Trials:   Xanax, lorazepam,  gabapentin,  Ambien, temazepam, trazodone, mirtazapine, quetiapine 25, Geodon, Olanzapine, risperidone 0.25, fluvoxamine, sertraline 300, fluoxetine 40,    topiramate 50, propranolol  M had severe Joshua Eaton  Review of Systems:   Review of Systems  Constitutional:  Negative for appetite change and unexpected weight change.  Eyes:  Positive for visual disturbance.  Cardiovascular:  Negative for palpitations.  Musculoskeletal:  Positive for back pain.  Neurological:  Positive for dizziness and tremors. Negative for weakness.       Balance trouble ongoing but looks like positional vertigo  Psychiatric/Behavioral:  Negative for agitation, behavioral problems, confusion, decreased concentration, dysphoric mood, hallucinations, self-injury, sleep disturbance and suicidal ideas. The patient is not nervous/anxious and is not hyperactive.     Medications: I have reviewed the patient's  current medications.  Current Outpatient Medications  Medication Sig Dispense Refill   Multiple Vitamins-Minerals (ICAPS AREDS 2) CAPS Take 1 capsule by mouth 2 (two) times daily. For eye care     propranolol (INDERAL) 20 MG tablet TAKE 1 TO 2 TABLETS BY MOUTH  TWICE DAILY AS NEEDED 360 tablet 3   sertraline (ZOLOFT) 100 MG tablet TAKE 2 TABLETS BY MOUTH IN THE  MORNING AND 1 TABLET BY MOUTH IN THE EVENING (Patient taking differently: TAKE 2 TABLETS BY MOUTH IN THE  MORNING AND 1/2 TABLET BY MOUTH IN THE EVENING) 270 tablet 3   LORazepam (ATIVAN) 1 MG tablet TAKE ONE-HALF TABLET BY MOUTH IN THE MORNING AND 1 TABLET BY  MOUTH AT NIGHT 135 tablet 1   OLANZapine (ZYPREXA) 2.5 MG tablet Take 1 tablet (2.5 mg total) by mouth at bedtime. 90 tablet 1   zolpidem (AMBIEN) 10 MG tablet Take 1 tablet (10 mg total) by mouth at bedtime. 30 tablet 5   No current facility-administered medications for this visit.    Medication Side Effects: Other: weight  Allergies:  Allergies  Allergen Reactions   Codeine     "sends me into orbit"    Past Medical History:  Diagnosis Date   Anxiety    Cancer (HCC)    Depression    Hypertension    OCD (obsessive compulsive disorder)     History reviewed. No pertinent family history.  Social History    Socioeconomic History   Marital status: Married    Spouse name: Not on file   Number of children: Not on file   Years of education: Not on file   Highest education level: Not on file  Occupational History   Not on file  Tobacco Use   Smoking status: Never   Smokeless tobacco: Never  Substance and Sexual Activity   Alcohol use: No   Drug use: No   Sexual activity: Not on file  Other Topics Concern   Not on file  Social History Narrative   Not on file   Social Determinants of Health   Financial Resource Strain: Not on file  Food Insecurity: Not on file  Transportation Needs: Not on file  Physical Activity: Not on file  Stress: Not on file  Social Connections: Not on file  Intimate Partner Violence: Not on file    Past Medical History, Surgical history, Social history, and Family history were reviewed and updated as appropriate.   Please see review of systems for further details on the patient's review from today.   Objective:   Physical Exam:  There were no vitals taken for this visit.  Physical Exam Constitutional:      Appearance: He is obese.  Neurological:     Mental Status: He is alert.     Sensory: No sensory deficit.     Gait: Gait normal.  Psychiatric:        Attention and Perception: Attention and perception normal.        Mood and Affect: Mood is anxious. Mood is not depressed.        Speech: Speech normal.        Behavior: Behavior is cooperative.        Thought Content: Thought content is not paranoid or delusional. Thought content does not include homicidal or suicidal ideation.        Cognition and Memory: Cognition normal.     Comments: Chronic anxiety much better than when he first came here. Fair insight and  judgment. Chronic OCD still better than before Zoloft. Talkative per usual.     Lab Review:     Component Value Date/Time   NA 138 02/03/2016 1708   K 3.5 02/03/2016 1708   CL 110 02/03/2016 1708   CO2 19 (L) 02/03/2016 1708    GLUCOSE 212 (H) 02/03/2016 1708   BUN 21 (H) 02/03/2016 1708   CREATININE 1.02 02/03/2016 1708   CALCIUM 8.9 02/03/2016 1708   PROT 6.6 02/03/2016 1708   ALBUMIN 3.8 02/03/2016 1708   AST 23 02/03/2016 1708   ALT 24 02/03/2016 1708   ALKPHOS 49 02/03/2016 1708   BILITOT 0.6 02/03/2016 1708   GFRNONAA >60 02/03/2016 1708   GFRAA >60 02/03/2016 1708       Component Value Date/Time   WBC 10.1 02/03/2016 1708   RBC 5.11 02/03/2016 1708   HGB 16.3 02/03/2016 1708   HCT 45.4 02/03/2016 1708   PLT 138 (L) 02/03/2016 1708   MCV 88.8 02/03/2016 1708   MCH 31.9 02/03/2016 1708   MCHC 35.9 02/03/2016 1708   RDW 13.1 02/03/2016 1708   LYMPHSABS 2.1 10/06/2015 0500   MONOABS 1.1 (H) 10/06/2015 0500   EOSABS 0.1 10/06/2015 0500   BASOSABS 0.1 10/06/2015 0500    No results found for: "POCLITH", "LITHIUM"   No results found for: "PHENYTOIN", "PHENOBARB", "VALPROATE", "CBMZ"   .res Assessment: Plan:    Gavon was seen today for follow-up, anxiety and sleeping problem.  Diagnoses and all orders for this visit:  Mixed obsessional thoughts and acts -     LORazepam (ATIVAN) 1 MG tablet; TAKE ONE-HALF TABLET BY MOUTH IN THE MORNING AND 1 TABLET BY  MOUTH AT NIGHT -     OLANZapine (ZYPREXA) 2.5 MG tablet; Take 1 tablet (2.5 mg total) by mouth at bedtime.  Panic disorder with agoraphobia  Depression, major, recurrent, in partial remission (HCC)  Insomnia due to other mental disorder -     zolpidem (AMBIEN) 10 MG tablet; Take 1 tablet (10 mg total) by mouth at bedtime.  Obstructive sleep apnea  Joshua Eaton of both hands  SSRI sexual dysfunction  Greater than 50% of 30 min  face to face time with patient was spent on counseling and coordination of care. We discussed the following:  Residual obsession are a lot better but not gone. I don't think memory problems are med related but related to not using CPAP. Disc CPAP use and ways to improve mask sealing. He's not using it consistently  lately. Disc importance of compliance for brain health.  He needs to contact [facility] and make an appt with them about it.  Sleep poor bc of it.  Sleep hygiene poor with TV on and explained to him.  He's unlikely to change. He's much better with meds.    USE CPAP FOR MEMORY, HEART HEALTH.  He probably will not comply. Disc caffeine.    Disc high dose sertraline and article supporting high dosage for TR OCD.  Disc risk serotonin syndrome.  Disc SSRI WD as cause for dizziness.  Advise against reduction from the high dosage because it is clearly helped his anxiety and depression.  His anxiety and depression are the best it has been in years.. Might be contributing to Joshua Eaton. Might have essential Joshua Eaton bc mother had Joshua Eaton to or could be related to olanzapine. continue reduction in olanzapine 2.5 mg HS.   Disc risk worsening OCD  and anxiety and if so increase dose If Joshua Eaton is not better  call and we'll try propranolol 20-40 mg twice daily  Extensive discussion of timing of olanzapine and Ambien which need to be separated to have most benefit for sleep.  Not sure he's doing this correctly.  Continue sertraline  250 mg daily but  Joshua Eaton is not better but anxiety no worse. Disc risk of OCD and anxiety getting worse  lorazepam 0.5-1 mg twice daily and  zolpidem 10 mg nightly.  Propranolol prn Joshua Eaton with some benefit.  Discussed potential metabolic side effects associated with atypical antipsychotics, as well as potential risk for movement side effects. Advised pt to contact office if movement side effects occur. Getting benefit from olanzapine markedly and he agrees.  We discussed the short-term risks associated with benzodiazepines including sedation and increased fall risk among others.  Discussed long-term side effect risk including dependence, potential withdrawal symptoms, and the potential eventual dose-related risk of dementia.  But recent studies from 2020 dispute this  association between benzodiazepines and dementia risk. Newer studies in 2020 do not support an association with dementia.  No med changes.  This appt was 30 mins.  FU 6 mos  Meredith Staggers, MD, DFAPA    Please see After Visit Summary for patient specific instructions.  No future appointments.    No orders of the defined types were placed in this encounter.      -------------------------------

## 2023-03-16 ENCOUNTER — Other Ambulatory Visit: Payer: Self-pay | Admitting: Psychiatry

## 2023-03-16 DIAGNOSIS — F422 Mixed obsessional thoughts and acts: Secondary | ICD-10-CM

## 2023-04-16 ENCOUNTER — Other Ambulatory Visit: Payer: Self-pay | Admitting: Anesthesiology

## 2023-04-16 DIAGNOSIS — M5459 Other low back pain: Secondary | ICD-10-CM

## 2023-04-27 ENCOUNTER — Ambulatory Visit (HOSPITAL_COMMUNITY)
Admission: RE | Admit: 2023-04-27 | Discharge: 2023-04-27 | Disposition: A | Discharge status: Home / Self Care | Payer: Medicare Other | Source: Ambulatory Visit | Attending: Anesthesiology | Admitting: Anesthesiology

## 2023-04-27 DIAGNOSIS — M5459 Other low back pain: Secondary | ICD-10-CM | POA: Insufficient documentation

## 2023-05-20 DIAGNOSIS — I340 Nonrheumatic mitral (valve) insufficiency: Secondary | ICD-10-CM | POA: Diagnosis not present

## 2023-05-20 DIAGNOSIS — I361 Nonrheumatic tricuspid (valve) insufficiency: Secondary | ICD-10-CM | POA: Diagnosis not present

## 2023-05-27 ENCOUNTER — Ambulatory Visit: Payer: Medicare Other | Admitting: Neurology

## 2023-06-01 DIAGNOSIS — I340 Nonrheumatic mitral (valve) insufficiency: Secondary | ICD-10-CM | POA: Diagnosis not present

## 2023-06-01 DIAGNOSIS — J189 Pneumonia, unspecified organism: Secondary | ICD-10-CM | POA: Diagnosis not present

## 2023-06-01 DIAGNOSIS — J9601 Acute respiratory failure with hypoxia: Secondary | ICD-10-CM | POA: Diagnosis not present

## 2023-06-02 ENCOUNTER — Encounter (HOSPITAL_COMMUNITY): Payer: Self-pay

## 2023-06-02 DIAGNOSIS — C189 Malignant neoplasm of colon, unspecified: Secondary | ICD-10-CM

## 2023-06-02 DIAGNOSIS — J9601 Acute respiratory failure with hypoxia: Secondary | ICD-10-CM | POA: Diagnosis not present

## 2023-06-02 DIAGNOSIS — I340 Nonrheumatic mitral (valve) insufficiency: Secondary | ICD-10-CM | POA: Diagnosis not present

## 2023-06-02 DIAGNOSIS — J189 Pneumonia, unspecified organism: Secondary | ICD-10-CM | POA: Diagnosis not present

## 2023-06-03 ENCOUNTER — Inpatient Hospital Stay (HOSPITAL_COMMUNITY)
Admission: AD | Admit: 2023-06-03 | Discharge: 2023-06-28 | DRG: 004 | Disposition: A | Discharge status: Other Acute Inpatient Hospital | Payer: Medicare Other | Source: Other Acute Inpatient Hospital | Attending: Family Medicine | Admitting: Family Medicine

## 2023-06-03 ENCOUNTER — Inpatient Hospital Stay (HOSPITAL_COMMUNITY): Payer: Medicare Other

## 2023-06-03 DIAGNOSIS — I110 Hypertensive heart disease with heart failure: Secondary | ICD-10-CM | POA: Diagnosis present

## 2023-06-03 DIAGNOSIS — I080 Rheumatic disorders of both mitral and aortic valves: Secondary | ICD-10-CM | POA: Diagnosis present

## 2023-06-03 DIAGNOSIS — E43 Unspecified severe protein-calorie malnutrition: Secondary | ICD-10-CM | POA: Diagnosis present

## 2023-06-03 DIAGNOSIS — Z9911 Dependence on respirator [ventilator] status: Secondary | ICD-10-CM | POA: Diagnosis not present

## 2023-06-03 DIAGNOSIS — Z85038 Personal history of other malignant neoplasm of large intestine: Secondary | ICD-10-CM

## 2023-06-03 DIAGNOSIS — I5043 Acute on chronic combined systolic (congestive) and diastolic (congestive) heart failure: Secondary | ICD-10-CM | POA: Diagnosis present

## 2023-06-03 DIAGNOSIS — J189 Pneumonia, unspecified organism: Secondary | ICD-10-CM | POA: Diagnosis present

## 2023-06-03 DIAGNOSIS — J690 Pneumonitis due to inhalation of food and vomit: Secondary | ICD-10-CM | POA: Diagnosis not present

## 2023-06-03 DIAGNOSIS — I341 Nonrheumatic mitral (valve) prolapse: Secondary | ICD-10-CM | POA: Diagnosis not present

## 2023-06-03 DIAGNOSIS — J9601 Acute respiratory failure with hypoxia: Secondary | ICD-10-CM | POA: Diagnosis present

## 2023-06-03 DIAGNOSIS — I351 Nonrheumatic aortic (valve) insufficiency: Secondary | ICD-10-CM | POA: Diagnosis not present

## 2023-06-03 DIAGNOSIS — J80 Acute respiratory distress syndrome: Principal | ICD-10-CM | POA: Diagnosis present

## 2023-06-03 DIAGNOSIS — E873 Alkalosis: Secondary | ICD-10-CM | POA: Diagnosis present

## 2023-06-03 DIAGNOSIS — Z9049 Acquired absence of other specified parts of digestive tract: Secondary | ICD-10-CM

## 2023-06-03 DIAGNOSIS — F429 Obsessive-compulsive disorder, unspecified: Secondary | ICD-10-CM | POA: Diagnosis present

## 2023-06-03 DIAGNOSIS — Z6823 Body mass index (BMI) 23.0-23.9, adult: Secondary | ICD-10-CM

## 2023-06-03 DIAGNOSIS — L89152 Pressure ulcer of sacral region, stage 2: Secondary | ICD-10-CM | POA: Diagnosis not present

## 2023-06-03 DIAGNOSIS — E870 Hyperosmolality and hypernatremia: Secondary | ICD-10-CM | POA: Diagnosis present

## 2023-06-03 DIAGNOSIS — Z781 Physical restraint status: Secondary | ICD-10-CM

## 2023-06-03 DIAGNOSIS — Z7951 Long term (current) use of inhaled steroids: Secondary | ICD-10-CM

## 2023-06-03 DIAGNOSIS — D6959 Other secondary thrombocytopenia: Secondary | ICD-10-CM | POA: Diagnosis present

## 2023-06-03 DIAGNOSIS — G9341 Metabolic encephalopathy: Secondary | ICD-10-CM | POA: Diagnosis not present

## 2023-06-03 DIAGNOSIS — K761 Chronic passive congestion of liver: Secondary | ICD-10-CM | POA: Diagnosis present

## 2023-06-03 DIAGNOSIS — I82621 Acute embolism and thrombosis of deep veins of right upper extremity: Secondary | ICD-10-CM | POA: Diagnosis not present

## 2023-06-03 DIAGNOSIS — T502X5A Adverse effect of carbonic-anhydrase inhibitors, benzothiadiazides and other diuretics, initial encounter: Secondary | ICD-10-CM | POA: Diagnosis not present

## 2023-06-03 DIAGNOSIS — J440 Chronic obstructive pulmonary disease with acute lower respiratory infection: Secondary | ICD-10-CM | POA: Diagnosis present

## 2023-06-03 DIAGNOSIS — I4891 Unspecified atrial fibrillation: Secondary | ICD-10-CM | POA: Diagnosis not present

## 2023-06-03 DIAGNOSIS — J810 Acute pulmonary edema: Secondary | ICD-10-CM | POA: Diagnosis not present

## 2023-06-03 DIAGNOSIS — N401 Enlarged prostate with lower urinary tract symptoms: Secondary | ICD-10-CM | POA: Diagnosis present

## 2023-06-03 DIAGNOSIS — N400 Enlarged prostate without lower urinary tract symptoms: Secondary | ICD-10-CM | POA: Diagnosis present

## 2023-06-03 DIAGNOSIS — L89323 Pressure ulcer of left buttock, stage 3: Secondary | ICD-10-CM | POA: Diagnosis present

## 2023-06-03 DIAGNOSIS — D531 Other megaloblastic anemias, not elsewhere classified: Secondary | ICD-10-CM | POA: Diagnosis present

## 2023-06-03 DIAGNOSIS — Z79899 Other long term (current) drug therapy: Secondary | ICD-10-CM

## 2023-06-03 DIAGNOSIS — F419 Anxiety disorder, unspecified: Secondary | ICD-10-CM | POA: Diagnosis not present

## 2023-06-03 DIAGNOSIS — I340 Nonrheumatic mitral (valve) insufficiency: Secondary | ICD-10-CM | POA: Diagnosis not present

## 2023-06-03 DIAGNOSIS — E876 Hypokalemia: Secondary | ICD-10-CM | POA: Diagnosis present

## 2023-06-03 DIAGNOSIS — E1165 Type 2 diabetes mellitus with hyperglycemia: Secondary | ICD-10-CM | POA: Diagnosis present

## 2023-06-03 DIAGNOSIS — N179 Acute kidney failure, unspecified: Secondary | ICD-10-CM | POA: Diagnosis not present

## 2023-06-03 DIAGNOSIS — Z885 Allergy status to narcotic agent status: Secondary | ICD-10-CM

## 2023-06-03 DIAGNOSIS — R509 Fever, unspecified: Secondary | ICD-10-CM | POA: Diagnosis not present

## 2023-06-03 DIAGNOSIS — E8779 Other fluid overload: Secondary | ICD-10-CM | POA: Diagnosis not present

## 2023-06-03 DIAGNOSIS — D638 Anemia in other chronic diseases classified elsewhere: Secondary | ICD-10-CM | POA: Diagnosis present

## 2023-06-03 DIAGNOSIS — Z7982 Long term (current) use of aspirin: Secondary | ICD-10-CM

## 2023-06-03 DIAGNOSIS — Z930 Tracheostomy status: Secondary | ICD-10-CM | POA: Diagnosis not present

## 2023-06-03 DIAGNOSIS — D696 Thrombocytopenia, unspecified: Secondary | ICD-10-CM | POA: Diagnosis not present

## 2023-06-03 DIAGNOSIS — R338 Other retention of urine: Secondary | ICD-10-CM | POA: Diagnosis present

## 2023-06-03 DIAGNOSIS — I959 Hypotension, unspecified: Secondary | ICD-10-CM | POA: Diagnosis not present

## 2023-06-03 LAB — COMPREHENSIVE METABOLIC PANEL
ALT: 14 U/L (ref 0–44)
AST: 30 U/L (ref 15–41)
Albumin: 1.9 g/dL — ABNORMAL LOW (ref 3.5–5.0)
Alkaline Phosphatase: 43 U/L (ref 38–126)
Anion gap: 11 (ref 5–15)
BUN: 40 mg/dL — ABNORMAL HIGH (ref 8–23)
CO2: 21 mmol/L — ABNORMAL LOW (ref 22–32)
Calcium: 8.6 mg/dL — ABNORMAL LOW (ref 8.9–10.3)
Chloride: 111 mmol/L (ref 98–111)
Creatinine, Ser: 0.98 mg/dL (ref 0.61–1.24)
GFR, Estimated: >60 mL/min (ref >60)
Glucose, Bld: 128 mg/dL — ABNORMAL HIGH (ref 70–99)
Potassium: 4.5 mmol/L (ref 3.5–5.1)
Sodium: 143 mmol/L (ref 135–145)
Total Bilirubin: 2.1 mg/dL — ABNORMAL HIGH (ref 0.3–1.2)
Total Protein: 4.5 g/dL — ABNORMAL LOW (ref 6.5–8.1)

## 2023-06-03 LAB — GLUCOSE, CAPILLARY
Glucose-Capillary: 113 mg/dL — ABNORMAL HIGH (ref 70–99)
Glucose-Capillary: 114 mg/dL — ABNORMAL HIGH (ref 70–99)
Glucose-Capillary: 122 mg/dL — ABNORMAL HIGH (ref 70–99)

## 2023-06-03 LAB — BLOOD GAS, VENOUS
Acid-Base Excess: 1.8 mmol/L (ref 0.0–2.0)
Bicarbonate: 28.6 mmol/L — ABNORMAL HIGH (ref 20.0–28.0)
Drawn by: 67249
O2 Saturation: 87.8 %
Patient temperature: 36.9
pCO2, Ven: 53 mm[Hg] (ref 44–60)
pH, Ven: 7.34 (ref 7.25–7.43)
pO2, Ven: 54 mm[Hg] — ABNORMAL HIGH (ref 32–45)

## 2023-06-03 LAB — PROCALCITONIN: Procalcitonin: 0.27 ng/mL

## 2023-06-03 LAB — PHOSPHORUS: Phosphorus: 2.6 mg/dL (ref 2.5–4.6)

## 2023-06-03 LAB — MRSA NEXT GEN BY PCR, NASAL: MRSA by PCR Next Gen: NOT DETECTED

## 2023-06-03 LAB — CBC
HCT: 30.8 % — ABNORMAL LOW (ref 39.0–52.0)
Hemoglobin: 9.8 g/dL — ABNORMAL LOW (ref 13.0–17.0)
MCH: 32.2 pg (ref 26.0–34.0)
MCHC: 31.8 g/dL (ref 30.0–36.0)
MCV: 101.3 fL — ABNORMAL HIGH (ref 80.0–100.0)
Platelets: 49 10*3/uL — ABNORMAL LOW (ref 150–400)
RBC: 3.04 MIL/uL — ABNORMAL LOW (ref 4.22–5.81)
RDW: 18.3 % — ABNORMAL HIGH (ref 11.5–15.5)
WBC: 10.2 10*3/uL (ref 4.0–10.5)
nRBC: 0 % (ref 0.0–0.2)

## 2023-06-03 LAB — HIV ANTIBODY (ROUTINE TESTING W REFLEX): HIV Screen 4th Generation wRfx: NONREACTIVE

## 2023-06-03 LAB — MAGNESIUM: Magnesium: 2.3 mg/dL (ref 1.7–2.4)

## 2023-06-03 LAB — BRAIN NATRIURETIC PEPTIDE: B Natriuretic Peptide: 564.4 pg/mL — ABNORMAL HIGH (ref 0.0–100.0)

## 2023-06-03 MED ORDER — IPRATROPIUM-ALBUTEROL 0.5-2.5 (3) MG/3ML IN SOLN: 3.0000 mL | RESPIRATORY_TRACT | Status: DC | PRN

## 2023-06-03 MED ORDER — SODIUM CHLORIDE 0.9% FLUSH
10.0000 mL | Freq: Two times a day (BID) | INTRAVENOUS | Status: DC
  Administered 2023-06-03 – 2023-06-05 (×4): 10 mL
  Administered 2023-06-05: 20 mL
  Administered 2023-06-06: 10 mL
  Administered 2023-06-06: 20 mL
  Administered 2023-06-07 – 2023-06-10 (×5): 10 mL
  Administered 2023-06-11: 20 mL

## 2023-06-03 MED ORDER — FUROSEMIDE 10 MG/ML IJ SOLN
40.0000 mg | Freq: Once | INTRAMUSCULAR | Status: AC
  Administered 2023-06-03: 40 mg via INTRAVENOUS
  Filled 2023-06-03: qty 4

## 2023-06-03 MED ORDER — FAMOTIDINE 20 MG PO TABS: 20.0000 mg | ORAL_TABLET | Freq: Two times a day (BID) | ORAL | Status: DC

## 2023-06-03 MED ORDER — SODIUM CHLORIDE 0.9% FLUSH: 10.0000 mL | INTRAVENOUS | Status: DC | PRN

## 2023-06-03 MED ORDER — VITAL HIGH PROTEIN PO LIQD
1000.0000 mL | ORAL | Status: DC
  Filled 2023-06-03: qty 1000

## 2023-06-03 MED ORDER — SODIUM CHLORIDE 0.9 % IV SOLN
2.0000 g | Freq: Two times a day (BID) | INTRAVENOUS | Status: DC
  Administered 2023-06-03: 2 g via INTRAVENOUS
  Filled 2023-06-03: qty 12.5

## 2023-06-03 MED ORDER — VITAL AF 1.2 CAL PO LIQD
1000.0000 mL | ORAL | Status: DC
  Administered 2023-06-03: 1000 mL

## 2023-06-03 MED ORDER — FENTANYL BOLUS VIA INFUSION
25.0000 ug | INTRAVENOUS | Status: DC | PRN
  Administered 2023-06-04: 50 ug via INTRAVENOUS
  Administered 2023-06-04: 25 ug via INTRAVENOUS
  Administered 2023-06-05 – 2023-06-07 (×5): 50 ug via INTRAVENOUS

## 2023-06-03 MED ORDER — POLYETHYLENE GLYCOL 3350 17 G PO PACK
17.0000 g | PACK | Freq: Every day | ORAL | Status: DC
  Administered 2023-06-03 – 2023-06-04 (×2): 17 g
  Filled 2023-06-03 (×2): qty 1

## 2023-06-03 MED ORDER — FENTANYL 2500MCG IN NS 250ML (10MCG/ML) PREMIX INFUSION
25.0000 ug/h | INTRAVENOUS | Status: DC
  Administered 2023-06-03: 25 ug/h via INTRAVENOUS
  Administered 2023-06-04: 125 ug/h via INTRAVENOUS
  Administered 2023-06-05: 50 ug/h via INTRAVENOUS
  Administered 2023-06-06: 75 ug/h via INTRAVENOUS
  Filled 2023-06-03 (×3): qty 250

## 2023-06-03 MED ORDER — PANTOPRAZOLE SODIUM 40 MG IV SOLR
40.0000 mg | Freq: Two times a day (BID) | INTRAVENOUS | Status: DC
  Administered 2023-06-03 – 2023-06-07 (×8): 40 mg via INTRAVENOUS
  Filled 2023-06-03 (×8): qty 10

## 2023-06-03 MED ORDER — DOCUSATE SODIUM 100 MG PO CAPS: 100.0000 mg | ORAL_CAPSULE | Freq: Two times a day (BID) | ORAL | Status: DC | PRN

## 2023-06-03 MED ORDER — CHLORHEXIDINE GLUCONATE CLOTH 2 % EX PADS
6.0000 | MEDICATED_PAD | Freq: Every day | CUTANEOUS | Status: DC
  Administered 2023-06-04: 6 via TOPICAL

## 2023-06-03 MED ORDER — VANCOMYCIN HCL 2000 MG/400ML IV SOLN
2000.0000 mg | Freq: Once | INTRAVENOUS | Status: AC
  Administered 2023-06-03: 2000 mg via INTRAVENOUS
  Filled 2023-06-03: qty 400

## 2023-06-03 MED ORDER — PROSOURCE TF20 ENFIT COMPATIBL EN LIQD
60.0000 mL | Freq: Every day | ENTERAL | Status: DC
  Administered 2023-06-03 – 2023-06-28 (×22): 60 mL
  Filled 2023-06-03 (×24): qty 60

## 2023-06-03 MED ORDER — POLYETHYLENE GLYCOL 3350 17 G PO PACK: 17.0000 g | PACK | Freq: Every day | ORAL | Status: DC | PRN

## 2023-06-03 MED ORDER — PROPOFOL 1000 MG/100ML IV EMUL
0.0000 ug/kg/min | INTRAVENOUS | Status: DC
  Administered 2023-06-03 (×2): 10 ug/kg/min via INTRAVENOUS
  Administered 2023-06-04: 25 ug/kg/min via INTRAVENOUS
  Administered 2023-06-04: 20 ug/kg/min via INTRAVENOUS
  Administered 2023-06-04: 15 ug/kg/min via INTRAVENOUS
  Administered 2023-06-05: 25 ug/kg/min via INTRAVENOUS
  Administered 2023-06-05: 20 ug/kg/min via INTRAVENOUS
  Administered 2023-06-05 – 2023-06-06 (×2): 25 ug/kg/min via INTRAVENOUS
  Administered 2023-06-06: 10 ug/kg/min via INTRAVENOUS
  Administered 2023-06-06: 25 ug/kg/min via INTRAVENOUS
  Administered 2023-06-07: 15 ug/kg/min via INTRAVENOUS
  Filled 2023-06-03 (×8): qty 100

## 2023-06-03 MED ORDER — DOCUSATE SODIUM 50 MG/5ML PO LIQD
100.0000 mg | Freq: Two times a day (BID) | ORAL | Status: DC
  Administered 2023-06-03 – 2023-06-04 (×3): 100 mg
  Filled 2023-06-03 (×3): qty 10

## 2023-06-03 MED ORDER — SODIUM CHLORIDE 0.9 % IV SOLN
2.0000 g | Freq: Three times a day (TID) | INTRAVENOUS | Status: DC
  Administered 2023-06-04 – 2023-06-06 (×7): 2 g via INTRAVENOUS
  Filled 2023-06-03 (×7): qty 12.5

## 2023-06-03 NOTE — Progress Notes (Deleted)
eLink Physician-Brief Progress Note Patient Name: Joshua Eaton DOB: 1955/09/15 MRN: [id number]   Date of Service  06/03/2023  HPI/Events of Note  Noted ABG.  Pt with improved oxygenation while proned (pO2 80) Pt with worsening respiratory alkalosis.   eICU Interventions  Will decrease TV to 540 (36ml/kg PBW), RR to 24 This would decrease MV from 17L/min to 13L/min. Will follow serial ABG.         Jancarlos Thrun M DELA CRUZ 06/03/2023, 8:29 PM

## 2023-06-03 NOTE — Progress Notes (Addendum)
eLink Physician-Brief Progress Note Patient Name: Joshua Eaton DOB: June 19, 1956 MRN: [id number]   Date of Service  06/03/2023  HPI/Events of Note  Pt intubated, on vent support.  CXR shows ETT in proper place. With bilateral airspace disease; edema vs. pneumonia  eICU Interventions  Unable to get ABG; ok to obtain VBG for now.  Bilateral wrist restraints also ordered as pt intermittently agitated, at risk for unintentional extubation        [name] 06/03/2023, 8:38 PM  5:32 AM Pt reported to have increased secretions being suctioned out this AM Placed order for CXR.

## 2023-06-03 NOTE — Progress Notes (Signed)
Joshua Eaton [id number] Admission Data: 06/03/2023 6:55 PM Attending Provider: Cheri Fowler, MD  EXB:[id number], Curly Rim, MD Consults/ Treatment Team:   Joshua Eaton is a 67 y.o. male patient admitted from [facility] awake, alert  & orientated  X 3,  Full Code, VSS - Blood pressure (!) 144/55, pulse 69, temperature 98.8 F (37.1 C), temperature source Axillary, resp. rate 17, height 6\' 1"  (1.854 m), weight 101.9 kg, SpO2 100%., O2, intubated Tele # [id number] placed and pt is currently running:normal sinus rhythm.   IV site WDL:  hand left, condition patent and no redness with a transparent dsg that's clean dry and intact.  Skin, clean-dry- intact without evidence of bruising, or skin tears.   No evidence of skin break down noted on exam.  Pt arrive by carelink, one unit of PRBC given prior to arrival.      Will cont to monitor and assist as needed.  [name], RN 06/03/2023 6:55 PM

## 2023-06-03 NOTE — H&P (Addendum)
NAME:  Joshua Eaton, MRN:  [id number], DOB:  04/02/1956, LOS: 0 ADMISSION DATE:  06/03/2023, CONSULTATION DATE:  06/02/2023 REFERRING MD:  Dr. Nadine Counts [facility], CHIEF COMPLAINT:  pneumonia, respiratory failure   History of Present Illness:  67 year old male with past medical history of colon cancer s/p right hemicolectomy 2007 , ?COPD wears CPAP at home, MVR who presented to [facility] on 05/20/2023 for shortness of breath. Per transfer notes, patient feeling unwell for approximately 6 days prior to initial presentation with cough, shortness of breath. He had an initial CXR showing bilateral airspace opacities and was eventually placed on BiPAP and admitted to ICU. On 9/20 he was intubated due to ongoing respiratory failure and antibiotics were widened for multifocal pneumonia. He had echo performed showing EF 60-65%, severe dilated LA, severe MR. Throughout his course at [facility] had ongoing ARDS picture, developed worsening AKI. Eventually, [facility] PCCM team was consulted for transfer here due to ongoing high vent requirements, unable to wean. Additionally, there was concern over need for MV replacement and possible need for right heart cath.   Pertinent  Medical History  colon cancer s/p right hemicolectomy 2007 , ?COPD wears CPAP at home, MVR  Significant Hospital Events: Including procedures, antibiotic start and stop dates in addition to other pertinent events   10/3: admitted to PCCM from [place]   Interim History / Subjective:  Patient unable to participate in subjective history   Objective   Height 6\' 1"  (1.854 m).    Vent Mode: PRVC FiO2 (%):  [100 %] 100 % Set Rate:  [16 bmp] 16 bmp Vt Set:  [640 mL] 640 mL PEEP:  [5 cmH20] 5 cmH20 Plateau Pressure:  [19 cmH20] 19 cmH20  No intake or output data in the 24 hours ending 06/03/23 1807 There were no vitals filed for this visit.  Examination: General: Middle aged male on vent in NAD HENT: [place]/AT, PERRL, no JVD Lungs: Bibasilar  crackles Cardiovascular: RRR, no MRG Abdomen: Soft, NT, nD Extremities: No acute deformity or edema Neuro: Alert, oriented, non-focal  Resolved Hospital Problem list     Assessment & Plan:   Acute respiratory failure with hypoxia: He was intubated 9/20 at [place]. Has since been treated for multifocal pneumonia. Cultures negative. Zosyn and Levaquin x 7 days ended 9/30. He has continued with high FiO2 requirements despite treatment. Diffuse infiltrates on CXR despite ABX. Also has severe MR Multifocal PNA - Full vent support - CXR and ABG - Escalate to cefepime and vanco, check PCT.  - Wean vent as able, we are told this has been difficult and he may require trach.  - Try a dose of lasix, repeat echo. Check BNP  Elevated BNP Severe MR: echo 9/20 EF 60-65%; sever MR Plan: -monitor UOP; consider lasix -daily weights; strict I/o's  AKI Plan: -avoid nephrotoxic agents; renal dose meds -monitor UOP  Possible GIB -having tarry stools and low hgb Plan: -PPI bid -consider gi consult -trend cbc  Anemia Thrombocytopenia Plan: -trend cbc  Nutrition - did not tolerate gastric feeding - now has post pyloric tube, which has been tolerated better.  - TF  Best Practice (right click and "Reselect all SmartList Selections" daily)   Diet/type: NPO DVT prophylaxis: SCD GI prophylaxis: H2B Lines: N/A Foley:  Yes, and it is still needed Code Status:  full code Last date of multidisciplinary goals of care discussion [10/3 updated family at bedside]  Labs   CBC: No results for input(s): "WBC", "NEUTROABS", "HGB", "HCT", "MCV", "PLT"  in the last 168 hours.  Basic Metabolic Panel: No results for input(s): "NA", "K", "CL", "CO2", "GLUCOSE", "BUN", "CREATININE", "CALCIUM", "MG", "PHOS" in the last 168 hours. GFR: CrCl cannot be calculated (Patient's most recent lab result is older than the maximum 21 days allowed.). No results for input(s): "PROCALCITON", "WBC", "LATICACIDVEN"  in the last 168 hours.  Liver Function Tests: No results for input(s): "AST", "ALT", "ALKPHOS", "BILITOT", "PROT", "ALBUMIN" in the last 168 hours. No results for input(s): "LIPASE", "AMYLASE" in the last 168 hours. No results for input(s): "AMMONIA" in the last 168 hours.  ABG No results found for: "PHART", "PCO2ART", "PO2ART", "HCO3", "TCO2", "ACIDBASEDEF", "O2SAT"   Coagulation Profile: No results for input(s): "INR", "PROTIME" in the last 168 hours.  Cardiac Enzymes: No results for input(s): "CKTOTAL", "CKMB", "CKMBINDEX", "TROPONINI" in the last 168 hours.  HbA1C: Hgb A1c MFr Bld  Date/Time Value Ref Range Status  12/09/2015 06:30 PM 5.1 4.8 - 5.6 % Final    Comment:    (NOTE)         Pre-diabetes: 5.7 - 6.4         Diabetes: >6.4         Glycemic control for adults with diabetes: <7.0     CBG: No results for input(s): "GLUCAP" in the last 168 hours.  Review of Systems:   As per HPI   Past Medical History:  He,  has a past medical history of Anxiety, Cancer (HCC), Depression, Hypertension, and OCD (obsessive compulsive disorder).   Surgical History:   Past Surgical History:  Procedure Laterality Date   CHOLECYSTECTOMY     COLON SURGERY     cancerous polyp removed   HERNIA REPAIR       Social History:   reports that he has never smoked. He has never used smokeless tobacco. He reports that he does not drink alcohol and does not use drugs.   Family History:  His family history is not on file.   Allergies Allergies  Allergen Reactions   Codeine     "sends me into orbit"     Home Medications  Prior to Admission medications   Medication Sig Start Date End Date Taking? Authorizing Provider  LORazepam (ATIVAN) 1 MG tablet TAKE ONE-HALF TABLET BY MOUTH IN THE MORNING AND 1 TABLET BY  MOUTH AT NIGHT 01/20/23   Cottle, Steva Ready., MD  Multiple Vitamins-Minerals (ICAPS AREDS 2) CAPS Take 1 capsule by mouth 2 (two) times daily. For eye care 12/11/15   Armandina Stammer I, NP  OLANZapine (ZYPREXA) 2.5 MG tablet Take 1 tablet (2.5 mg total) by mouth at bedtime. 01/20/23   Cottle, Steva Ready., MD  propranolol (INDERAL) 20 MG tablet TAKE 1 TO 2 TABLETS BY MOUTH  TWICE DAILY AS NEEDED 01/21/23   Cottle, Steva Ready., MD  sertraline (ZOLOFT) 100 MG tablet TAKE 2 TABLETS BY MOUTH IN THE  MORNING AND 1 TABLET BY MOUTH IN THE EVENING 03/17/23   Cottle, Steva Ready., MD  zolpidem (AMBIEN) 10 MG tablet Take 1 tablet (10 mg total) by mouth at bedtime. 01/20/23   Cottle, Steva Ready., MD     Critical care time: 35 min     Joneen Roach, AGACNP-BC [facility]  See Amion for personal pager PCCM on call pager [phone] until 7pm. Please call Elink 7p-7a. [phone]  06/03/2023 7:05 PM

## 2023-06-03 NOTE — Progress Notes (Signed)
RT attempted ABG X2 with no success. Only fluid is coming out. RT will try again later.

## 2023-06-03 NOTE — Progress Notes (Signed)
Pharmacy Antibiotic Note  Joshua Eaton is a 67 y.o. male admitted on 06/03/2023 with HAP.  Pharmacy has been consulted for cefepime dosing.  Plan: Cefepime 2g IV q8h Follow up renal function, cultures as available, clinical progress, length of tx  Height: 6\' 1"  (185.4 cm) Weight: 101.9 kg (224 lb 10.4 oz) IBW/kg (Calculated) : 79.9  Temp (24hrs), Avg:98.8 F (37.1 C), Min:98.8 F (37.1 C), Max:98.8 F (37.1 C)  Recent Labs  Lab 06/03/23 2112  WBC 10.2  CREATININE 0.98    Estimated Creatinine Clearance: 89.3 mL/min (by C-G formula based on SCr of 0.98 mg/dL).    Allergies  Allergen Reactions   Codeine     "sends me into orbit"    Antimicrobials this admission: 10/3 Vanc x 1 10/3 Cefepime >>  Dose adjustments this admission:  Microbiology results: 10/3 MRSA PCR: neg  Thank you for allowing pharmacy to be a part of this patient's care.  Loralee Pacas, PharmD, BCPS 06/03/2023 9:49 PM  Please check AMION for all [facility] Pharmacy phone numbers After 10:00 PM, call Main Pharmacy [phone]

## 2023-06-04 ENCOUNTER — Inpatient Hospital Stay (HOSPITAL_COMMUNITY): Payer: Medicare Other

## 2023-06-04 DIAGNOSIS — Z9911 Dependence on respirator [ventilator] status: Secondary | ICD-10-CM

## 2023-06-04 DIAGNOSIS — I340 Nonrheumatic mitral (valve) insufficiency: Secondary | ICD-10-CM | POA: Diagnosis not present

## 2023-06-04 DIAGNOSIS — J810 Acute pulmonary edema: Secondary | ICD-10-CM | POA: Diagnosis not present

## 2023-06-04 LAB — CBC
HCT: 31.6 % — ABNORMAL LOW (ref 39.0–52.0)
Hemoglobin: 10.2 g/dL — ABNORMAL LOW (ref 13.0–17.0)
MCH: 32.4 pg (ref 26.0–34.0)
MCHC: 32.3 g/dL (ref 30.0–36.0)
MCV: 100.3 fL — ABNORMAL HIGH (ref 80.0–100.0)
Platelets: 55 10*3/uL — ABNORMAL LOW (ref 150–400)
RBC: 3.15 MIL/uL — ABNORMAL LOW (ref 4.22–5.81)
RDW: 19.6 % — ABNORMAL HIGH (ref 11.5–15.5)
WBC: 11.3 10*3/uL — ABNORMAL HIGH (ref 4.0–10.5)
nRBC: 0.2 % (ref 0.0–0.2)

## 2023-06-04 LAB — TRIGLYCERIDES: Triglycerides: 131 mg/dL (ref <150)

## 2023-06-04 LAB — VITAMIN B12: Vitamin B-12: 455 pg/mL (ref 180–914)

## 2023-06-04 LAB — ECHOCARDIOGRAM COMPLETE
AR max vel: 3.34 cm2
AV Area VTI: 3.52 cm2
AV Area mean vel: 3.13 cm2
AV Mean grad: 8.2 mm[Hg]
AV Peak grad: 15.5 mm[Hg]
Ao pk vel: 1.97 m/s
Area-P 1/2: 2.61 cm2
Height: 73 in
MV VTI: 0.92 cm2
P 1/2 time: 362 ms
S' Lateral: 3.5 cm
Weight: 3604.96 [oz_av]

## 2023-06-04 LAB — MAGNESIUM
Magnesium: 2.1 mg/dL (ref 1.7–2.4)
Magnesium: 2 mg/dL (ref 1.7–2.4)

## 2023-06-04 LAB — BASIC METABOLIC PANEL
Anion gap: 9 (ref 5–15)
BUN: 43 mg/dL — ABNORMAL HIGH (ref 8–23)
CO2: 24 mmol/L (ref 22–32)
Calcium: 8.8 mg/dL — ABNORMAL LOW (ref 8.9–10.3)
Chloride: 112 mmol/L — ABNORMAL HIGH (ref 98–111)
Creatinine, Ser: 0.98 mg/dL (ref 0.61–1.24)
GFR, Estimated: >60 mL/min (ref >60)
Glucose, Bld: 116 mg/dL — ABNORMAL HIGH (ref 70–99)
Potassium: 4.1 mmol/L (ref 3.5–5.1)
Sodium: 145 mmol/L (ref 135–145)

## 2023-06-04 LAB — GLUCOSE, CAPILLARY
Glucose-Capillary: 107 mg/dL — ABNORMAL HIGH (ref 70–99)
Glucose-Capillary: 116 mg/dL — ABNORMAL HIGH (ref 70–99)
Glucose-Capillary: 117 mg/dL — ABNORMAL HIGH (ref 70–99)
Glucose-Capillary: 141 mg/dL — ABNORMAL HIGH (ref 70–99)
Glucose-Capillary: 173 mg/dL — ABNORMAL HIGH (ref 70–99)
Glucose-Capillary: 153 mg/dL — ABNORMAL HIGH (ref 70–99)

## 2023-06-04 LAB — PHOSPHORUS
Phosphorus: 2.3 mg/dL — ABNORMAL LOW (ref 2.5–4.6)
Phosphorus: 2.5 mg/dL (ref 2.5–4.6)

## 2023-06-04 LAB — FOLATE: Folate: 5 ng/mL — ABNORMAL LOW (ref >5.9)

## 2023-06-04 MED ORDER — VITAL 1.5 CAL PO LIQD
1000.0000 mL | ORAL | Status: DC
  Administered 2023-06-04 – 2023-06-14 (×14): 1000 mL
  Filled 2023-06-04 (×3): qty 1000

## 2023-06-04 MED ORDER — FUROSEMIDE 10 MG/ML IJ SOLN
60.0000 mg | Freq: Three times a day (TID) | INTRAMUSCULAR | Status: AC
  Administered 2023-06-04 (×2): 60 mg via INTRAVENOUS
  Filled 2023-06-04 (×2): qty 6

## 2023-06-04 MED ORDER — ORAL CARE MOUTH RINSE
15.0000 mL | OROMUCOSAL | Status: DC
  Administered 2023-06-04 – 2023-06-25 (×246): 15 mL via OROMUCOSAL

## 2023-06-04 MED ORDER — HYDRALAZINE HCL 25 MG PO TABS
25.0000 mg | ORAL_TABLET | Freq: Three times a day (TID) | ORAL | Status: DC
  Administered 2023-06-04 – 2023-06-08 (×12): 25 mg
  Filled 2023-06-04 (×13): qty 1

## 2023-06-04 MED ORDER — HEPARIN SODIUM (PORCINE) 5000 UNIT/ML IJ SOLN
5000.0000 [IU] | Freq: Three times a day (TID) | INTRAMUSCULAR | Status: DC
  Administered 2023-06-04 – 2023-06-05 (×3): 5000 [IU] via SUBCUTANEOUS
  Filled 2023-06-04 (×3): qty 1

## 2023-06-04 MED ORDER — ORAL CARE MOUTH RINSE: 15.0000 mL | OROMUCOSAL | Status: DC | PRN

## 2023-06-04 MED ORDER — CHLORHEXIDINE GLUCONATE CLOTH 2 % EX PADS
6.0000 | MEDICATED_PAD | CUTANEOUS | Status: DC
  Administered 2023-06-05 – 2023-06-28 (×24): 6 via TOPICAL

## 2023-06-04 MED ORDER — POTASSIUM PHOSPHATES 15 MMOLE/5ML IV SOLN
30.0000 mmol | Freq: Once | INTRAVENOUS | Status: AC
  Administered 2023-06-04: 30 mmol via INTRAVENOUS
  Filled 2023-06-04: qty 10

## 2023-06-04 NOTE — Progress Notes (Signed)
NAME:  Joshua Eaton, MRN:  [id number], DOB:  [birth year], LOS: 1 ADMISSION DATE:  06/03/2023, CONSULTATION DATE:  06/02/2023 REFERRING MD:  Dr. Nadine Counts [facility], CHIEF COMPLAINT:  Pneumonia    History of Present Illness:  This is a 67 year old male with a past medical history of colon cancer status post right hemicolectomy 2007, COPD, mitral valve Regurgitation who presented to [facility] health on 05/20/2023 for shortness of breath.  Prior to presenting, patient was feeling unwell for about 6 days with cough, shortness of breath. He had an initial CXR showing bilateral airspace opacities and was eventually placed on BiPAP and admitted to ICU. On 9/20 he was intubated due to ongoing respiratory failure and antibiotics were widened for multifocal pneumonia. He had echo performed showing EF 60-65%, severe dilated LA, severe MR. Throughout his course at [facility] had ongoing ARDS picture, developed worsening AKI. Eventually, [facility] PCCM team was consulted for transfer here due to ongoing high vent requirements, unable to wean. Additionally, there was concern over need for MV replacement and possible need for right heart cath.   Pertinent  Medical History  Colon cancer, status post hemicolectomy (right) 2007, questionable COPD wears CPAP at home, mitral valve regurgitation   Significant Hospital Events: Including procedures, antibiotic start and stop dates in addition to other pertinent events   Admitted to [facility] health on 05/20/2023 06/03/2023: Transferred from [facility] to [facility]  Interim History / Subjective:  Overnight events: Patient agitated, needed wrist restraints for agitation.  Had increased secretions being suctioned out.  Placed order for chest x-ray.  Patient evaluated at bedside this morning.  Patient is intubated and sedated.  Not able to participate in patient-physician interview.  Objective   Blood pressure (!) 129/45, pulse 70, temperature 99.5 F (37.5 C), resp. rate 15,  height 6\' 1"  (1.854 m), weight 102.2 kg, SpO2 96%.  Maps currently between 68-79, satting at 96-100% on ventilator, afebrile, pulse 60s-70s, sedated with fent and prop Vent Mode: PRVC FiO2 (%):  [60 %-100 %] 60 % Set Rate:  [16 bmp] 16 bmp Vt Set:  [640 mL] 640 mL PEEP:  [5 cmH20] 5 cmH20 Plateau Pressure:  [19 cmH20-25 cmH20] 25 cmH20   Intake/Output Summary (Last 24 hours) at 06/04/2023 0702 Last data filed at 06/04/2023 0600 Gross per 24 hour  Intake 970.97 ml  Output 1675 ml  Net -704.03 ml   Filed Weights   06/03/23 1807 06/04/23 0459  Weight: 101.9 kg 102.2 kg    Examination: General: Intubated, sedated, critically ill HENT: Normocephalic, atraumatic, ET tube in place Lungs: Coarse breath sounds bilaterally, decreased breath sounds to right lung field worse than left Cardiovascular: Grade 3/6 systolic murmur noted throughout Abdomen: Soft, nontender, normal active bowel sounds Extremities: 1+ pitting edema noted below bilateral malleoli Neuro: Intubated, sedated GU: Foley in place, draining well  Labs reviewed: CMP: Sodium 143, potassium 4.5, albumin 1.9, glucose 128, bilirubin 2.1 CBC: Platelets 49, hemoglobin 9.8, MCV 101.3, white count 10.2 Pro-Cal 0.27 Mag 2.3 Phos 2.6 pH: 7.34, pCO2 53, pO2 54  Resolved Hospital Problem list   AKI  Assessment & Plan:  This is a 67 year old male with past medical history of colon cancer, mitral valve replacement who presents to the emergency department for concerns of shortness of breath.  Patient diagnosed with pneumonia and admitted to the ICU for further evaluation and management.  Patient was transferred over from [facility] for further monitoring.  #Acute hypoxemic respiratory failure secondary to multifocal pneumonia #Sepsis  Patient  did have cefepime and vancomycin yesterday.  Patient did have negative MRSA nares.  I am trying to track down cultures from previous hospital.  Will continue with cefepime at this time.   Will stop vancomycin.  Legionella and strep antigen were negative at previous hospital.  There is some charting about Staph epidermidis and blood cultures, likely contaminant.  Repeat chest x-ray showing diffuse opacities.  Entire right lung almost looks whited out.  Patient was treated previously with Zosyn and Levaquin.  Was also treated with Bactrim.  Has been intubated since 9/20. I do wonder if this could be related to pulmonary edema and if there is an underlying effusion. Patient did well with Lasix yesterday and will give more lasix today  -Continue cefepime -Stop vancomycin -Follow-up echo -Start lasix 60 mg q8h for 2 doses  #History of mitral regurgitation  Patient does have grade 3 systolic murmur noted on exam.  Will obtain echo today.  Elevated BNP with some edema noted. -Trial 60 of Lasix today q8h  #Macrocytic anemia #Thrombocytopenia Likely concomitant with critical illness.  Can obtain B12 and folate levels. Platlets 49. Per charting, there is some concern for Dark tarry stools. Patient is currently with normal blood pressures. On PPI. Will trend CBC to make sure that patient is not losing.  -Follow CBC -Monitor for evidence of bleeding -Follow up b12 -Start lovenox for DVT ppx  -folate and b12 pending   #Concern for GI bleed, resolved Did have some concern for GI bleed at the previous hospital. Did resolve as hgb is stable at this time and is up trending -monitor cbc -continue ppi bid  #Nutrition Currently on tube feeds  Best Practice (right click and "Reselect all SmartList Selections" daily)   Diet/type: tubefeeds DVT prophylaxis: SCD GI prophylaxis: PPI Lines: N/A Foley:  Yes, and it is still needed Code Status:  full code  Labs   CBC: Recent Labs  Lab 06/03/23 2112  WBC 10.2  HGB 9.8*  HCT 30.8*  MCV 101.3*  PLT 49*    Basic Metabolic Panel: Recent Labs  Lab 06/03/23 2112  NA 143  K 4.5  CL 111  CO2 21*  GLUCOSE 128*  BUN 40*  CREATININE  0.98  CALCIUM 8.6*  MG 2.3  PHOS 2.6   GFR: Estimated Creatinine Clearance: 89.4 mL/min (by C-G formula based on SCr of 0.98 mg/dL). Recent Labs  Lab 06/03/23 2112  PROCALCITON 0.27  WBC 10.2    Liver Function Tests: Recent Labs  Lab 06/03/23 2112  AST 30  ALT 14  ALKPHOS 43  BILITOT 2.1*  PROT 4.5*  ALBUMIN 1.9*   No results for input(s): "LIPASE", "AMYLASE" in the last 168 hours. No results for input(s): "AMMONIA" in the last 168 hours.  ABG    Component Value Date/Time   HCO3 28.6 (H) 06/03/2023 2229   O2SAT 87.8 06/03/2023 2229     Coagulation Profile: No results for input(s): "INR", "PROTIME" in the last 168 hours.  Cardiac Enzymes: No results for input(s): "CKTOTAL", "CKMB", "CKMBINDEX", "TROPONINI" in the last 168 hours.  HbA1C: Hgb A1c MFr Bld  Date/Time Value Ref Range Status  12/09/2015 06:30 PM 5.1 4.8 - 5.6 % Final    Comment:    (NOTE)         Pre-diabetes: 5.7 - 6.4         Diabetes: >6.4         Glycemic control for adults with diabetes: <7.0     CBG: Recent  Labs  Lab 06/03/23 1811 06/03/23 1936 06/03/23 2350 06/04/23 0329  GLUCAP 113* 122* 114* 107*    Review of Systems:   Unable to obtain 2/2 intubation and sedation   Past Medical History:  He,  has a past medical history of Anxiety, Cancer (HCC), Depression, Hypertension, and OCD (obsessive compulsive disorder).   Surgical History:   Past Surgical History:  Procedure Laterality Date   CHOLECYSTECTOMY     COLON SURGERY     cancerous polyp removed   HERNIA REPAIR       Social History:   reports that he has never smoked. He has never used smokeless tobacco. He reports that he does not drink alcohol and does not use drugs.   Family History:  His family history is not on file.   Allergies Allergies  Allergen Reactions   Codeine     "sends me into orbit"     Home Medications  Prior to Admission medications   Medication Sig Start Date End Date Taking? Authorizing  Provider  LORazepam (ATIVAN) 1 MG tablet TAKE ONE-HALF TABLET BY MOUTH IN THE MORNING AND 1 TABLET BY  MOUTH AT NIGHT 01/20/23   Cottle, Steva Ready., MD  Multiple Vitamins-Minerals (ICAPS AREDS 2) CAPS Take 1 capsule by mouth 2 (two) times daily. For eye care 12/11/15   Armandina Stammer I, NP  OLANZapine (ZYPREXA) 2.5 MG tablet Take 1 tablet (2.5 mg total) by mouth at bedtime. 01/20/23   Cottle, Steva Ready., MD  propranolol (INDERAL) 20 MG tablet TAKE 1 TO 2 TABLETS BY MOUTH  TWICE DAILY AS NEEDED 01/21/23   Cottle, Steva Ready., MD  sertraline (ZOLOFT) 100 MG tablet TAKE 2 TABLETS BY MOUTH IN THE  MORNING AND 1 TABLET BY MOUTH IN THE EVENING 03/17/23   Cottle, Steva Ready., MD  zolpidem (AMBIEN) 10 MG tablet Take 1 tablet (10 mg total) by mouth at bedtime. 01/20/23   Cottle, Steva Ready., MD     Critical care time: 40 mins     Modena Slater, DO Internal Medicine Resident PGY-2 Pager: [phone]

## 2023-06-04 NOTE — Progress Notes (Signed)
Initial Nutrition Assessment  DOCUMENTATION CODES:   Not applicable  INTERVENTION:  TF via NGT (tip in distal stomach): Transition to Vital 1.5 at 11ml/h ( per day) Start at 21ml/h and advance by 10ml q8h to goal rate of 25ml/h 60ml PrSoruce TF once daily  Goal TF regimen provides: 2240 kcal, 117g protein, and per day  NUTRITION DIAGNOSIS:   Inadequate oral intake related to acute illness as evidenced by NPO status.  GOAL:   Patient will meet greater than or equal to 90% of their needs  MONITOR:   Vent status, Labs, Weight trends, TF tolerance  REASON FOR ASSESSMENT:   Consult Enteral/tube feeding initiation and management  ASSESSMENT:   Pt admitted with PNA and respiratory failure. PMH significant for colon cancer s/p R hemicolectomy (2007), COPD, MVR.   9/20: intubated at [facility] 10/3: transferred to [facility] d/t high vent requirements, unable to wean, and concern for need for MVR and R heart cath  Assessed pt at bedside. Unable to provide nutrition history as he remains on vent support. No family present at bedside.   Pt noted to have severe muscle deficits on nutrition focused physical exam. Which is suggestive of a chronic period of undernutrition however and likely a degree of malnutrition however cannot confirm additional criteria to diagnose at this time. Will attempt to gather more information throughout admission.    Vital AF 1.2 infusing at 95ml/h at time of visit. Given presence of edema, will transition TF to a more concentrated formula.   Patient is currently intubated on ventilator support MV: 10.3 L/min Temp (24hrs), Avg:99.2 F (37.3 C), Min:98.4 F (36.9 C), Max:100.2 F (37.9 C) Current MAP 75  Unfortunately there is no prior weight history on file to review.  9/17: 224 lbs +moderate BLE and B hand edema  Medications: colace, lasix 60mg  q8h, protonix, miralax  Drips: Abx Fentanyl Potassium phos Propofol @ 12.23  ml/hr  Labs: BUN 43, phos 2.3, CBG's 107-122 x24 hours  NUTRITION - FOCUSED PHYSICAL EXAM:  Flowsheet Row Most Recent Value  Orbital Region Unable to assess  Upper Arm Region Mild depletion  Thoracic and Lumbar Region No depletion  Buccal Region Unable to assess  Temple Region Severe depletion  Clavicle Bone Region Severe depletion  Clavicle and Acromion Bone Region Moderate depletion  Scapular Bone Region Unable to assess  Dorsal Hand Unable to assess  [mod edema]  Patellar Region Unable to assess  [RLE edema]  Anterior Thigh Region Severe depletion  Posterior Calf Region Severe depletion  Edema (RD Assessment) Moderate  [moderate BLE]  Hair Reviewed  Eyes Reviewed  Mouth Unable to assess  Skin Reviewed  Nails Reviewed       Diet Order:   Diet Order             Diet NPO time specified  Diet effective now                   EDUCATION NEEDS:   No education needs have been identified at this time  Skin:  Skin Assessment: Reviewed RN Assessment  Last BM:  PTA/unknown  Height:   Ht Readings from Last 1 Encounters:  06/03/23 6\' 1"  (1.854 m)    Weight:   Wt Readings from Last 1 Encounters:  06/04/23 102.2 kg   BMI:  Body mass index is 29.73 kg/m.  Estimated Nutritional Needs:   Kcal:  2100-2300  Protein:  105-120g  Fluid:  >/=2L  Drusilla Kanner, RDN, LDN Clinical Nutrition

## 2023-06-04 NOTE — Progress Notes (Signed)
Cortrak Tube Team Note:  Consult received to place a bridle on an existing small bore feeding tube present from outside facility. Placed without incident and left at 88cm at the nare   Greig Castilla, RD, LDN Clinical Dietitian RD pager # available in AMION  After hours/weekend pager # available in [facility]

## 2023-06-04 NOTE — Progress Notes (Signed)
*  PRELIMINARY RESULTS* Echocardiogram 2D Echocardiogram has been performed.  Joshua Eaton 06/04/2023, 9:57 AM

## 2023-06-05 DIAGNOSIS — J9601 Acute respiratory failure with hypoxia: Secondary | ICD-10-CM | POA: Diagnosis not present

## 2023-06-05 DIAGNOSIS — Z9911 Dependence on respirator [ventilator] status: Secondary | ICD-10-CM | POA: Diagnosis not present

## 2023-06-05 DIAGNOSIS — E8779 Other fluid overload: Secondary | ICD-10-CM

## 2023-06-05 DIAGNOSIS — I340 Nonrheumatic mitral (valve) insufficiency: Secondary | ICD-10-CM | POA: Diagnosis not present

## 2023-06-05 LAB — COMPREHENSIVE METABOLIC PANEL
ALT: 14 U/L (ref 0–44)
AST: 28 U/L (ref 15–41)
Albumin: 1.7 g/dL — ABNORMAL LOW (ref 3.5–5.0)
Alkaline Phosphatase: 50 U/L (ref 38–126)
Anion gap: 9 (ref 5–15)
BUN: 46 mg/dL — ABNORMAL HIGH (ref 8–23)
CO2: 27 mmol/L (ref 22–32)
Calcium: 8.3 mg/dL — ABNORMAL LOW (ref 8.9–10.3)
Chloride: 110 mmol/L (ref 98–111)
Creatinine, Ser: 0.95 mg/dL (ref 0.61–1.24)
GFR, Estimated: >60 mL/min (ref >60)
Glucose, Bld: 167 mg/dL — ABNORMAL HIGH (ref 70–99)
Potassium: 3 mmol/L — ABNORMAL LOW (ref 3.5–5.1)
Sodium: 146 mmol/L — ABNORMAL HIGH (ref 135–145)
Total Bilirubin: 1.6 mg/dL — ABNORMAL HIGH (ref 0.3–1.2)
Total Protein: 4.3 g/dL — ABNORMAL LOW (ref 6.5–8.1)

## 2023-06-05 LAB — CBC
HCT: 30.2 % — ABNORMAL LOW (ref 39.0–52.0)
Hemoglobin: 9.5 g/dL — ABNORMAL LOW (ref 13.0–17.0)
MCH: 31 pg (ref 26.0–34.0)
MCHC: 31.5 g/dL (ref 30.0–36.0)
MCV: 98.7 fL (ref 80.0–100.0)
Platelets: 52 10*3/uL — ABNORMAL LOW (ref 150–400)
RBC: 3.06 MIL/uL — ABNORMAL LOW (ref 4.22–5.81)
RDW: 19.9 % — ABNORMAL HIGH (ref 11.5–15.5)
WBC: 8.7 10*3/uL (ref 4.0–10.5)
nRBC: 0.2 % (ref 0.0–0.2)

## 2023-06-05 LAB — GLUCOSE, CAPILLARY
Glucose-Capillary: 143 mg/dL — ABNORMAL HIGH (ref 70–99)
Glucose-Capillary: 144 mg/dL — ABNORMAL HIGH (ref 70–99)
Glucose-Capillary: 152 mg/dL — ABNORMAL HIGH (ref 70–99)
Glucose-Capillary: 159 mg/dL — ABNORMAL HIGH (ref 70–99)
Glucose-Capillary: 163 mg/dL — ABNORMAL HIGH (ref 70–99)
Glucose-Capillary: 153 mg/dL — ABNORMAL HIGH (ref 70–99)

## 2023-06-05 LAB — TYPE AND SCREEN
ABO/RH(D): O POS
Antibody Screen: NEGATIVE

## 2023-06-05 LAB — PHOSPHORUS: Phosphorus: 2.8 mg/dL (ref 2.5–4.6)

## 2023-06-05 LAB — ABO/RH: ABO/RH(D): O POS

## 2023-06-05 LAB — MAGNESIUM: Magnesium: 2 mg/dL (ref 1.7–2.4)

## 2023-06-05 MED ORDER — POTASSIUM CHLORIDE 10 MEQ/100ML IV SOLN
10.0000 meq | INTRAVENOUS | Status: AC
  Administered 2023-06-05 (×4): 10 meq via INTRAVENOUS
  Filled 2023-06-05 (×4): qty 100

## 2023-06-05 MED ORDER — HYDRALAZINE HCL 20 MG/ML IJ SOLN
INTRAMUSCULAR | Status: AC
  Filled 2023-06-05: qty 1

## 2023-06-05 MED ORDER — POTASSIUM CHLORIDE 20 MEQ PO PACK
20.0000 meq | PACK | ORAL | Status: AC
  Administered 2023-06-05 (×2): 20 meq
  Filled 2023-06-05 (×2): qty 1

## 2023-06-05 MED ORDER — FOLIC ACID 1 MG PO TABS
1.0000 mg | ORAL_TABLET | Freq: Every day | ORAL | Status: DC
  Administered 2023-06-05 – 2023-06-08 (×4): 1 mg
  Filled 2023-06-05 (×5): qty 1

## 2023-06-05 MED ORDER — BANATROL TF EN LIQD
60.0000 mL | Freq: Two times a day (BID) | ENTERAL | Status: DC
  Administered 2023-06-05 – 2023-06-15 (×18): 60 mL
  Filled 2023-06-05 (×20): qty 60

## 2023-06-05 MED ORDER — FREE WATER
200.0000 mL | Status: DC
  Administered 2023-06-05 (×5): 200 mL

## 2023-06-05 MED ORDER — ADULT MULTIVITAMIN LIQUID CH
15.0000 mL | Freq: Every day | ORAL | Status: DC
  Administered 2023-06-05 – 2023-06-08 (×4): 15 mL
  Filled 2023-06-05 (×5): qty 15

## 2023-06-05 MED ORDER — LOSARTAN POTASSIUM 50 MG PO TABS
25.0000 mg | ORAL_TABLET | Freq: Every day | ORAL | Status: DC
  Administered 2023-06-05 – 2023-06-08 (×4): 25 mg
  Filled 2023-06-05 (×4): qty 1

## 2023-06-05 MED ORDER — POTASSIUM CHLORIDE 20 MEQ PO PACK
40.0000 meq | PACK | Freq: Once | ORAL | Status: AC
  Administered 2023-06-05: 40 meq
  Filled 2023-06-05: qty 2

## 2023-06-05 MED ORDER — SODIUM CHLORIDE 0.9% IV SOLUTION: Freq: Once | INTRAVENOUS | Status: DC

## 2023-06-05 MED ORDER — FUROSEMIDE 10 MG/ML IJ SOLN
80.0000 mg | Freq: Three times a day (TID) | INTRAMUSCULAR | Status: AC
  Administered 2023-06-05 (×2): 80 mg via INTRAVENOUS
  Filled 2023-06-05 (×2): qty 8

## 2023-06-05 MED ORDER — HEPARIN SODIUM (PORCINE) 5000 UNIT/ML IJ SOLN
5000.0000 [IU] | Freq: Three times a day (TID) | INTRAMUSCULAR | Status: DC
  Administered 2023-06-06 – 2023-06-16 (×30): 5000 [IU] via SUBCUTANEOUS
  Filled 2023-06-05 (×30): qty 1

## 2023-06-05 MED ORDER — INSULIN ASPART 100 UNIT/ML IJ SOLN
2.0000 [IU] | INTRAMUSCULAR | Status: DC
  Administered 2023-06-05 – 2023-06-06 (×6): 4 [IU] via SUBCUTANEOUS
  Administered 2023-06-06 – 2023-06-07 (×3): 2 [IU] via SUBCUTANEOUS
  Administered 2023-06-07 – 2023-06-08 (×6): 4 [IU] via SUBCUTANEOUS
  Administered 2023-06-08 – 2023-06-09 (×5): 2 [IU] via SUBCUTANEOUS
  Administered 2023-06-09: 4 [IU] via SUBCUTANEOUS
  Administered 2023-06-09 – 2023-06-10 (×6): 2 [IU] via SUBCUTANEOUS
  Administered 2023-06-11 (×3): 4 [IU] via SUBCUTANEOUS
  Administered 2023-06-11: 2 [IU] via SUBCUTANEOUS
  Administered 2023-06-11 – 2023-06-12 (×4): 4 [IU] via SUBCUTANEOUS
  Administered 2023-06-12: 2 [IU] via SUBCUTANEOUS
  Administered 2023-06-12: 4 [IU] via SUBCUTANEOUS
  Administered 2023-06-12: 2 [IU] via SUBCUTANEOUS
  Administered 2023-06-13: 4 [IU] via SUBCUTANEOUS
  Administered 2023-06-13: 2 [IU] via SUBCUTANEOUS
  Administered 2023-06-13: 4 [IU] via SUBCUTANEOUS
  Administered 2023-06-13: 6 [IU] via SUBCUTANEOUS
  Administered 2023-06-13 (×2): 2 [IU] via SUBCUTANEOUS
  Administered 2023-06-14 (×4): 4 [IU] via SUBCUTANEOUS
  Administered 2023-06-14: 2 [IU] via SUBCUTANEOUS
  Administered 2023-06-14 (×2): 4 [IU] via SUBCUTANEOUS
  Administered 2023-06-15 (×2): 2 [IU] via SUBCUTANEOUS
  Administered 2023-06-15: 4 [IU] via SUBCUTANEOUS
  Administered 2023-06-15: 2 [IU] via SUBCUTANEOUS
  Administered 2023-06-15 – 2023-06-16 (×4): 4 [IU] via SUBCUTANEOUS
  Administered 2023-06-16: 2 [IU] via SUBCUTANEOUS
  Administered 2023-06-16: 4 [IU] via SUBCUTANEOUS
  Administered 2023-06-16 – 2023-06-17 (×4): 2 [IU] via SUBCUTANEOUS
  Administered 2023-06-17: 4 [IU] via SUBCUTANEOUS
  Administered 2023-06-17 – 2023-06-18 (×4): 2 [IU] via SUBCUTANEOUS
  Administered 2023-06-18: 4 [IU] via SUBCUTANEOUS
  Administered 2023-06-18 – 2023-06-20 (×9): 2 [IU] via SUBCUTANEOUS
  Administered 2023-06-20 (×2): 4 [IU] via SUBCUTANEOUS
  Administered 2023-06-20: 2 [IU] via SUBCUTANEOUS
  Administered 2023-06-21: 4 [IU] via SUBCUTANEOUS
  Administered 2023-06-21: 2 [IU] via SUBCUTANEOUS
  Administered 2023-06-21 (×2): 4 [IU] via SUBCUTANEOUS
  Administered 2023-06-22 – 2023-06-26 (×18): 2 [IU] via SUBCUTANEOUS
  Administered 2023-06-26 (×2): 4 [IU] via SUBCUTANEOUS
  Administered 2023-06-26: 2 [IU] via SUBCUTANEOUS
  Administered 2023-06-26: 4 [IU] via SUBCUTANEOUS
  Administered 2023-06-27: 2 [IU] via SUBCUTANEOUS
  Administered 2023-06-27: 4 [IU] via SUBCUTANEOUS
  Administered 2023-06-27 (×3): 2 [IU] via SUBCUTANEOUS
  Administered 2023-06-28: 4 [IU] via SUBCUTANEOUS
  Administered 2023-06-28: 2 [IU] via SUBCUTANEOUS
  Administered 2023-06-28: 4 [IU] via SUBCUTANEOUS
  Administered 2023-06-28: 2 [IU] via SUBCUTANEOUS

## 2023-06-05 MED ORDER — DOCUSATE SODIUM 50 MG/5ML PO LIQD: 100.0000 mg | Freq: Every day | ORAL | Status: DC | PRN

## 2023-06-05 NOTE — Progress Notes (Signed)
NAME:  Joshua Eaton, MRN:  [id number], DOB:  12-14-1955, LOS: 2 ADMISSION DATE:  06/03/2023, CONSULTATION DATE:  06/02/2023 REFERRING MD:  Dr. Nadine Counts [facility], CHIEF COMPLAINT:  Pneumonia    History of Present Illness:  This is a 67 year old male with a past medical history of colon cancer status post right hemicolectomy 2007, COPD, mitral valve Regurgitation who presented to [facility] health on 05/20/2023 for shortness of breath.  Prior to presenting, patient was feeling unwell for about 6 days with cough, shortness of breath. He had an initial CXR showing bilateral airspace opacities and was eventually placed on BiPAP and admitted to ICU. On 9/20 he was intubated due to ongoing respiratory failure and antibiotics were widened for multifocal pneumonia. He had echo performed showing EF 60-65%, severe dilated LA, severe MR. Throughout his course at [facility] had ongoing ARDS picture, developed worsening AKI. Eventually, [facility] PCCM team was consulted for transfer here due to ongoing high vent requirements, unable to wean. Additionally, there was concern over need for MV replacement and possible need for right heart cath.   Pertinent  Medical History  Colon cancer, status post hemicolectomy (right) 2007, questionable COPD wears CPAP at home, mitral valve regurgitation   Significant Hospital Events: Including procedures, antibiotic start and stop dates in addition to other pertinent events   Admitted to [facility] health on 05/20/2023 06/03/2023: Transferred from [place] [facility] to [facility]  Interim History / Subjective:  Sedated, not answering questions. Failed SBT yesterday.  Diarrhea after bowel regimen yesterday, no blood in stool.  Objective   Blood pressure (!) 126/47, pulse 64, temperature 99 F (37.2 C), temperature source Bladder, resp. rate 17, height 6\' 1"  (1.854 m), weight 102.6 kg, SpO2 100%.   Vent Mode: PRVC FiO2 (%):  [50 %-55 %] 55 % Set Rate:  [16 bmp] 16 bmp Vt Set:  [640 mL] 640  mL PEEP:  [5 cmH20] 5 cmH20 Plateau Pressure:  [21 cmH20-22 cmH20] 21 cmH20   Intake/Output Summary (Last 24 hours) at 06/05/2023 0815 Last data filed at 06/05/2023 0800 Gross per 24 hour  Intake 2646.49 ml  Output 3725 ml  Net -1078.51 ml   Filed Weights   06/03/23 1807 06/04/23 0459 06/05/23 0213  Weight: 101.9 kg 102.2 kg 102.6 kg    Examination: General: Critically ill-appearing man lying in bed no acute distress HENT: [place]/AT, endotracheal tube in place Lungs: Decreased basilar breath sounds, rales bilaterally. Cardiovascular: Systolic murmur throughout precordium.  S1-S2 regular rate and rhythm Abdomen: Soft, nontender, nondistended Extremities: Pitting edema in arms and legs Neuro: RASS -4, breathing over the vent GU: Foley in place with clear yellow urine  Respiratory culture-no WBC  Sodium 146 Potassium 3.0 BUN 46 Creatinine 0.95 Bilirubin 1.6 WBC 8.7 H/H 9.5/30.2 Platelets 52  Folic acid low at 5 B12   455  Bedside US: minimal small R dependent effusion- not large enough to drain.   Resolved Hospital Problem list   AKI- present PTA  Assessment & Plan:  Acute respiratory failure with hypoxia requiring MV- with elevated BNP and normal PCT, favor acute pulmonary edema being the more significant cause currently. RLL pneumonia; procalcitonin low suggest against active pneumonia -trach aspirate culture repeat -LTVV - VAP prevention protocol - PAD protocol for sedation - Daily SAT and SBT as appropriate.  Likely to require tracheostomy. Will require platelet transfusion for tracheostomy. -Continue diuresis  -Anticipate stopping antibiotics in the coming days as long as cultures remain negative   Severe MR Mild to moderate AI -  Continue diuresis - Continue afterload reduction with hydralazine, adding low-dose losartan.  Hold beta-blocker for now. - Consult cardiology when more stable from respiratory standpoint.  Anticipate he would require TEE for further  evaluation.   HTN LVH on echo -Hydralazine, losartan   Thrombocytopenia- potentially due to antibiotics acutely, but per wife has had low platelet counts preceding this admission. Has never been evaluated by hematology. HIV negative.  Anemia; new since 2017 Concern for GIB at OSH -Monitor for bleeding - Continue PPI twice daily - If evidence of GI bleeding will consult GI for evaluation - Continue [place] heparin and monitoring for bleeding.  With low platelets he is increased risk of bleeding but he has been critically ill for about 2 weeks and has increased risk of clotting as well.   Mild hyperglycemia, controlled off insulin -Adding sliding scale insulin - Goal blood glucose 1 4180   At risk for malnutrition -Tube feeds via cortrak   AKI PTA, resolved. Peak Cr 2.1. -Renally dose meds, avoid nephrotoxic meds - Monitor - Strict I's/O   Hypophosphatemia -repleted   Hyperglycemia, mild, controlled without insulin -SSI as needed -Goal blood glucose 140-180   BPH -Unable to take PTA tamsulosin until able to take oral meds -Unfortunately still continues to require Foley catheter due to retention   Megaloblastic anemia with low folate.  B12 within normal limits. - Folate supplementation  Hyperbilirubinemia with normal transaminases, improving.  Suspect this is related to heart failure. -Continue to monitor - Continue diuresis  Hypernatremia - Free water enterally   Best Practice (right click and "Reselect all SmartList Selections" daily)   Diet/type: tubefeeds DVT prophylaxis: prophylactic heparin  GI prophylaxis: PPI Lines: Central line Foley:  Yes, and it is still needed Code Status:  full code  Labs   CBC: Recent Labs  Lab 06/03/23 2112 06/04/23 0902 06/05/23 0209  WBC 10.2 11.3* 8.7  HGB 9.8* 10.2* 9.5*  HCT 30.8* 31.6* 30.2*  MCV 101.3* 100.3* 98.7  PLT 49* 55* 52*    Basic Metabolic Panel: Recent Labs  Lab 06/03/23 2112 06/04/23 0902  06/04/23 1743 06/05/23 0209  NA 143 145  --  146*  K 4.5 4.1  --  3.0*  CL 111 112*  --  110  CO2 21* 24  --  27  GLUCOSE 128* 116*  --  167*  BUN 40* 43*  --  46*  CREATININE 0.98 0.98  --  0.95  CALCIUM 8.6* 8.8*  --  8.3*  MG 2.3 2.1 2.0 2.0  PHOS 2.6 2.3* 2.5 2.8   GFR: Estimated Creatinine Clearance: 92.4 mL/min (by C-G formula based on SCr of 0.95 mg/dL). Recent Labs  Lab 06/03/23 2112 06/04/23 0902 06/05/23 0209  PROCALCITON 0.27  --   --   WBC 10.2 11.3* 8.7   CBG: Recent Labs  Lab 06/04/23 1519 06/04/23 1914 06/04/23 2320 06/05/23 0310 06/05/23 0717  GLUCAP 141* 173* 153* 143* 144*    Critical care time:     This patient is critically ill with multiple organ system failure which requires frequent high complexity decision making, assessment, support, evaluation, and titration of therapies. This was completed through the application of advanced monitoring technologies and extensive interpretation of multiple databases. During this encounter critical care time was devoted to patient care services described in this note for 35 minutes.  Steffanie Dunn, DO 06/05/23 8:47 AM [facility]  For contact information, see Amion. If no response to pager, please call PCCM  consult pager. After hours, 7PM- 7AM, please call Elink.

## 2023-06-05 NOTE — Progress Notes (Signed)
Wife and another family member updated at bedside.  Planning for trach if he fails SBT.  Failed SBT this afternoon. Planning for 1 unit of platelets, then trach this afternoon.  Steffanie Dunn, DO 06/05/23 12:09 PM [facility]  For contact information, see Amion. If no response to pager, please call PCCM consult pager. After hours, 7PM- 7AM, please call Elink.

## 2023-06-05 NOTE — Progress Notes (Signed)
[facility] ADULT ICU REPLACEMENT PROTOCOL   The patient does apply for the [facility] Adult ICU Electrolyte Replacment Protocol based on the criteria listed below:   1.Exclusion criteria: TCTS, ECMO, Dialysis, and Myasthenia Gravis patients 2. Is GFR >/= 30 ml/min? Yes.    Patient's GFR today is >60 3. Is SCr </= 2? Yes.   Patient's SCr is 0.95 mg/dL 4. Did SCr increase >/= 0.5 in 24 hours? No. 5.Pt's weight >40kg  Yes.   6. Abnormal electrolyte(s): K+ 3.0  7. Electrolytes replaced per protocol 8.  Call MD STAT for K+ </= 2.5, Phos </= 1, or Mag </= 1 Physician:  Larinda Buttery [facility] 06/05/2023 3:08 AM

## 2023-06-06 ENCOUNTER — Inpatient Hospital Stay (HOSPITAL_COMMUNITY): Payer: Medicare Other

## 2023-06-06 DIAGNOSIS — D696 Thrombocytopenia, unspecified: Secondary | ICD-10-CM | POA: Diagnosis not present

## 2023-06-06 DIAGNOSIS — E876 Hypokalemia: Secondary | ICD-10-CM

## 2023-06-06 DIAGNOSIS — J9601 Acute respiratory failure with hypoxia: Secondary | ICD-10-CM | POA: Diagnosis not present

## 2023-06-06 DIAGNOSIS — Z9911 Dependence on respirator [ventilator] status: Secondary | ICD-10-CM | POA: Diagnosis not present

## 2023-06-06 LAB — BASIC METABOLIC PANEL
Anion gap: 9 (ref 5–15)
Anion gap: 9 (ref 5–15)
BUN: 40 mg/dL — ABNORMAL HIGH (ref 8–23)
BUN: 42 mg/dL — ABNORMAL HIGH (ref 8–23)
CO2: 28 mmol/L (ref 22–32)
CO2: 27 mmol/L (ref 22–32)
Calcium: 7.9 mg/dL — ABNORMAL LOW (ref 8.9–10.3)
Calcium: 8.2 mg/dL — ABNORMAL LOW (ref 8.9–10.3)
Chloride: 107 mmol/L (ref 98–111)
Chloride: 108 mmol/L (ref 98–111)
Creatinine, Ser: 0.78 mg/dL (ref 0.61–1.24)
Creatinine, Ser: 0.77 mg/dL (ref 0.61–1.24)
GFR, Estimated: >60 mL/min (ref >60)
GFR, Estimated: >60 mL/min (ref >60)
Glucose, Bld: 108 mg/dL — ABNORMAL HIGH (ref 70–99)
Glucose, Bld: 120 mg/dL — ABNORMAL HIGH (ref 70–99)
Potassium: 3.2 mmol/L — ABNORMAL LOW (ref 3.5–5.1)
Potassium: 4 mmol/L (ref 3.5–5.1)
Sodium: 144 mmol/L (ref 135–145)
Sodium: 144 mmol/L (ref 135–145)

## 2023-06-06 LAB — GLUCOSE, CAPILLARY
Glucose-Capillary: 103 mg/dL — ABNORMAL HIGH (ref 70–99)
Glucose-Capillary: 114 mg/dL — ABNORMAL HIGH (ref 70–99)
Glucose-Capillary: 145 mg/dL — ABNORMAL HIGH (ref 70–99)
Glucose-Capillary: 120 mg/dL — ABNORMAL HIGH (ref 70–99)
Glucose-Capillary: 156 mg/dL — ABNORMAL HIGH (ref 70–99)
Glucose-Capillary: 169 mg/dL — ABNORMAL HIGH (ref 70–99)

## 2023-06-06 LAB — CBC
HCT: 29.2 % — ABNORMAL LOW (ref 39.0–52.0)
Hemoglobin: 9.4 g/dL — ABNORMAL LOW (ref 13.0–17.0)
MCH: 32.1 pg (ref 26.0–34.0)
MCHC: 32.2 g/dL (ref 30.0–36.0)
MCV: 99.7 fL (ref 80.0–100.0)
Platelets: 50 10*3/uL — ABNORMAL LOW (ref 150–400)
RBC: 2.93 MIL/uL — ABNORMAL LOW (ref 4.22–5.81)
RDW: 20.3 % — ABNORMAL HIGH (ref 11.5–15.5)
WBC: 8.7 10*3/uL (ref 4.0–10.5)
nRBC: 0 % (ref 0.0–0.2)

## 2023-06-06 LAB — MAGNESIUM: Magnesium: 2.8 mg/dL — ABNORMAL HIGH (ref 1.7–2.4)

## 2023-06-06 MED ORDER — POTASSIUM CHLORIDE 20 MEQ PO PACK
40.0000 meq | PACK | Freq: Once | ORAL | Status: AC
  Administered 2023-06-06: 40 meq
  Filled 2023-06-06: qty 2

## 2023-06-06 MED ORDER — FUROSEMIDE 10 MG/ML IJ SOLN
80.0000 mg | Freq: Three times a day (TID) | INTRAMUSCULAR | Status: AC
  Administered 2023-06-06 (×2): 80 mg via INTRAVENOUS
  Filled 2023-06-06 (×2): qty 8

## 2023-06-06 MED ORDER — FENTANYL CITRATE PF 50 MCG/ML IJ SOSY
200.0000 ug | PREFILLED_SYRINGE | Freq: Once | INTRAMUSCULAR | Status: AC
  Administered 2023-06-06: 200 ug via INTRAVENOUS

## 2023-06-06 MED ORDER — FREE WATER
200.0000 mL | Freq: Four times a day (QID) | Status: DC
  Administered 2023-06-06 – 2023-06-10 (×12): 200 mL

## 2023-06-06 MED ORDER — ETOMIDATE 2 MG/ML IV SOLN
20.0000 mg | Freq: Once | INTRAVENOUS | Status: AC
  Administered 2023-06-06: 20 mg via INTRAVENOUS
  Filled 2023-06-06: qty 10

## 2023-06-06 MED ORDER — POTASSIUM CHLORIDE 20 MEQ PO PACK
20.0000 meq | PACK | ORAL | Status: AC
  Administered 2023-06-06 (×2): 20 meq
  Filled 2023-06-06: qty 1

## 2023-06-06 MED ORDER — LIDOCAINE-EPINEPHRINE 1 %-1:100000 IJ SOLN
20.0000 mL | Freq: Once | INTRAMUSCULAR | Status: AC
  Administered 2023-06-06: 20 mL via INTRADERMAL
  Filled 2023-06-06: qty 1

## 2023-06-06 MED ORDER — NOREPINEPHRINE 4 MG/250ML-% IV SOLN
INTRAVENOUS | Status: AC
  Filled 2023-06-06: qty 250

## 2023-06-06 MED ORDER — POTASSIUM CHLORIDE 10 MEQ/100ML IV SOLN
10.0000 meq | INTRAVENOUS | Status: AC
  Administered 2023-06-06 (×4): 10 meq via INTRAVENOUS
  Filled 2023-06-06 (×4): qty 100

## 2023-06-06 MED ORDER — MAGNESIUM SULFATE 2 GM/50ML IV SOLN
2.0000 g | Freq: Four times a day (QID) | INTRAVENOUS | Status: AC
  Administered 2023-06-06 (×2): 2 g via INTRAVENOUS
  Filled 2023-06-06 (×2): qty 50

## 2023-06-06 MED ORDER — SODIUM CHLORIDE 0.9 % IV SOLN
250.0000 mL | INTRAVENOUS | Status: DC
  Administered 2023-06-06: 250 mL via INTRAVENOUS

## 2023-06-06 MED ORDER — ROCURONIUM BROMIDE 10 MG/ML (PF) SYRINGE
PREFILLED_SYRINGE | INTRAVENOUS | Status: AC
  Administered 2023-06-06: 100 mg
  Filled 2023-06-06: qty 10

## 2023-06-06 MED ORDER — NOREPINEPHRINE 4 MG/250ML-% IV SOLN
2.0000 ug/min | INTRAVENOUS | Status: DC
  Administered 2023-06-06: 3 ug/min via INTRAVENOUS

## 2023-06-06 MED ORDER — ROCURONIUM BROMIDE 10 MG/ML (PF) SYRINGE
100.0000 mg | PREFILLED_SYRINGE | Freq: Once | INTRAVENOUS | Status: AC
  Administered 2023-06-06: 100 mg via INTRAVENOUS
  Filled 2023-06-06: qty 10

## 2023-06-06 MED ORDER — MIDAZOLAM HCL 2 MG/2ML IJ SOLN
5.0000 mg | Freq: Once | INTRAMUSCULAR | Status: AC
  Administered 2023-06-06: 6 mg via INTRAVENOUS
  Filled 2023-06-06: qty 6

## 2023-06-06 NOTE — Procedures (Signed)
Diagnostic Bronchoscopy  Joshua Eaton  [id number]  Sep 20, 1955  Date:06/06/23  Time:1:21 PM   Provider Performing:Michelangelo Rindfleisch C Katrinka Blazing   Procedure: Diagnostic Bronchoscopy (95638) w/ BAL  Indication(s) Assist with direct visualization of tracheostomy placement  Consent Risks of the procedure as well as the alternatives and risks of each were explained to the patient and/or caregiver.  Consent for the procedure was obtained.   Anesthesia See separate tracheostomy note   Time Out Verified patient identification, verified procedure, site/side was marked, verified correct patient position, special equipment/implants available, medications/allergies/relevant history reviewed, required imaging and test results available.   Sterile Technique Usual hand hygiene, masks, gowns, and gloves were used   Procedure Description Bronchoscope advanced through endotracheal tube and into airway.  After suctioning out tracheal secretions, bronchoscope used to provide direct visualization of tracheostomy placement and showed balloon entirely within tracheal lumen.  BAL then performed in RML with return of bloody fluid.   Complications/Tolerance None; patient tolerated the procedure well.   EBL None  Specimen(s) RML BAL

## 2023-06-06 NOTE — Progress Notes (Signed)
NAME:  Joshua Eaton, MRN:  [id number], DOB:  1955/09/05, LOS: 3 ADMISSION DATE:  06/03/2023, CONSULTATION DATE:  06/02/2023 REFERRING MD:  Dr. Nadine Counts [facility], CHIEF COMPLAINT:  Pneumonia    History of Present Illness:  This is a 67 year old male with a past medical history of colon cancer status post right hemicolectomy 2007, COPD, mitral valve Regurgitation who presented to [facility] health on 05/20/2023 for shortness of breath.  Prior to presenting, patient was feeling unwell for about 6 days with cough, shortness of breath. He had an initial CXR showing bilateral airspace opacities and was eventually placed on BiPAP and admitted to ICU. On 9/20 he was intubated due to ongoing respiratory failure and antibiotics were widened for multifocal pneumonia. He had echo performed showing EF 60-65%, severe dilated LA, severe MR. Throughout his course at [facility] had ongoing ARDS picture, developed worsening AKI. Eventually, [facility] PCCM team was consulted for transfer here due to ongoing high vent requirements, unable to wean. Additionally, there was concern over need for MV replacement and possible need for right heart cath.   Pertinent  Medical History  Colon cancer, status post hemicolectomy (right) 2007, questionable COPD wears CPAP at home, mitral valve regurgitation   Significant Hospital Events: Including procedures, antibiotic start and stop dates in addition to other pertinent events   Admitted to [facility] health on 05/20/2023 06/03/2023: Transferred from [facility] to [facility] 10/5 failed SBT  Interim History / Subjective:  Planning for trach today.  Tmax 99.5.  Objective   Blood pressure (!) 129/46, pulse 70, temperature 99 F (37.2 C), temperature source Bladder, resp. rate (!) 22, height 6\' 1"  (1.854 m), weight 98.2 kg, SpO2 99%.   Vent Mode: PRVC FiO2 (%):  [40 %] 40 % Set Rate:  [16 bmp] 16 bmp Vt Set:  [640 mL] 640 mL PEEP:  [5 cmH20] 5 cmH20 Plateau Pressure:  [18 cmH20-27  cmH20] 24 cmH20   Intake/Output Summary (Last 24 hours) at 06/06/2023 0823 Last data filed at 06/06/2023 0800 Gross per 24 hour  Intake 3092.43 ml  Output 4235 ml  Net -1142.57 ml   Filed Weights   06/04/23 0459 06/05/23 0213 06/06/23 0127  Weight: 102.2 kg 102.6 kg 98.2 kg    Examination: General: Critically ill-appearing man lying in bed no acute distress HENT: [place]/AT, eyes anicteric Lungs: Breathing comfortably mechanical ventilation, faint basilar rales.  Clear anteriorly.  Minimal endotracheal tube secretions. Cardiovascular: S1-S2, regular rate and rhythm.  Systolic murmur throughout the precordium. Abdomen: soft, NT Extremities: No cyanosis, pitting edema in all extremities Neuro: RASS -4, large pupils, reactive GU: Foley with clear yellow urine  I/O -2.6L  for admission   Respiratory culture-no WBC, NGTD  Sodium 144 Potassium 3.2 BUN 40 Creatinine 0.78 WBC 0.7  H/H 9.4/29.2 Platelets 50  Resolved Hospital Problem list   AKI- present PTA  Assessment & Plan:  Acute respiratory failure with hypoxia requiring MV- with elevated BNP and normal PCT, favor acute pulmonary edema being the more significant cause currently. RLL pneumonia; procalcitonin low suggest against active pneumonia -Repeat trach aspirate culture - Low tidal volume ventilation - VAP prevention protocol - PAD protocol for sedation - Planning for trach today, continue vent weaning efforts. -Continues to need diuresis - Stop antibiotics, monitor fever curve and white blood cell count   Severe MR Mild to moderate AI - Needs to require diuresis - Continue afterload reduction with hydralazine, low-dose losartan.  Holding beta-blocker. -Consult cardiology when more stable from respiratory standpoint.  He may eventually require TEE for further valve evaluation   HTN; wide pulse pressure due to valve disease LVH on echo - Continue losartan and hydralazine   Hypokalemia - Aggressive potassium  magnesium repletion - Recheck this afternoon  Thrombocytopenia- potentially due to antibiotics acutely, but per wife has had low platelet counts preceding this admission. Has never been evaluated by hematology. HIV negative.  Anemia; new since 2017 Concern for GIB at OSH - Monitor for signs of bleeding - Continue twice daily PPI - Holding DVT prophylaxis for trach today. - Would consult GI if develops evidence of GI bleeding. -1 unit of platelets today before trach   Mild hyperglycemia, controlled off insulin -Sliding scale insulin as needed - Goal blood glucose 140-180   At risk for malnutrition -Tube feeds on hold this morning for trach, can resume after trach is placed.   AKI PTA, resolved. Peak Cr 2.1. - Strict I's/O - Renally dose meds and avoid nephrotoxic meds - Continue to monitor   Hypophosphatemia -Recheck tomorrow   BPH -Unable to take PTA tamsulosin until able to take oral meds -Continue Foley catheter due to concern for retention.  When he is off sedation post tracheostomy can consider discontinuing Foley   Megaloblastic anemia with low folate.  B12 within normal limits. -Continue folate supplementation  Hyperbilirubinemia with normal transaminases, improving.  Suspect this is related to heart failure. -Monitor LFTs periodically - Continue diuresis  Hypernatremia - Can slow down enteral free water.   Best Practice (right click and "Reselect all SmartList Selections" daily)   Diet/type: tubefeeds DVT prophylaxis: SCD GI prophylaxis: PPI Lines: Central line Foley:  Yes, and it is still needed Code Status:  full code  Labs   CBC: Recent Labs  Lab 06/03/23 2112 06/04/23 0902 06/05/23 0209 06/06/23 0228  WBC 10.2 11.3* 8.7 8.7  HGB 9.8* 10.2* 9.5* 9.4*  HCT 30.8* 31.6* 30.2* 29.2*  MCV 101.3* 100.3* 98.7 99.7  PLT 49* 55* 52* 50*    Basic Metabolic Panel: Recent Labs  Lab 06/03/23 2112 06/04/23 0902 06/04/23 1743 06/05/23 0209  06/06/23 0228  NA 143 145  --  146* 144  K 4.5 4.1  --  3.0* 3.2*  CL 111 112*  --  110 107  CO2 21* 24  --  27 28  GLUCOSE 128* 116*  --  167* 108*  BUN 40* 43*  --  46* 40*  CREATININE 0.98 0.98  --  0.95 0.78  CALCIUM 8.6* 8.8*  --  8.3* 7.9*  MG 2.3 2.1 2.0 2.0  --   PHOS 2.6 2.3* 2.5 2.8  --    GFR: Estimated Creatinine Clearance: 107.5 mL/min (by C-G formula based on SCr of 0.78 mg/dL). Recent Labs  Lab 06/03/23 2112 06/04/23 0902 06/05/23 0209 06/06/23 0228  PROCALCITON 0.27  --   --   --   WBC 10.2 11.3* 8.7 8.7   CBG: Recent Labs  Lab 06/05/23 1506 06/05/23 1932 06/05/23 2305 06/06/23 0312 06/06/23 0731  GLUCAP 159* 163* 153* 103* 114*    Critical care time:     This patient is critically ill with multiple organ system failure which requires frequent high complexity decision making, assessment, support, evaluation, and titration of therapies. This was completed through the application of advanced monitoring technologies and extensive interpretation of multiple databases. During this encounter critical care time was devoted to patient care services described in this note for 36 minutes.  Steffanie Dunn, DO 06/06/23 8:36 AM  Pulmonary &  Critical Care  For contact information, see Amion. If no response to pager, please call PCCM consult pager. After hours, 7PM- 7AM, please call Elink.

## 2023-06-06 NOTE — Plan of Care (Signed)
?  Problem: Clinical Measurements: ?Goal: Diagnostic test results will improve ?Outcome: Progressing ?  ?Problem: Clinical Measurements: ?Goal: Respiratory complications will improve ?Outcome: Progressing ?  ?Problem: Nutrition: ?Goal: Adequate nutrition will be maintained ?Outcome: Progressing ?  ?Problem: Coping: ?Goal: Level of anxiety will decrease ?Outcome: Progressing ?  ?Problem: Pain Managment: ?Goal: General experience of comfort will improve ?Outcome: Progressing ?  ?

## 2023-06-06 NOTE — Procedures (Signed)
Bedside Tracheostomy Insertion Procedure Note   Patient Details:   Name: Joshua Eaton DOB: 1955/11/02 MRN: [id number]  Procedure: Tracheostomy  Pre Procedure Assessment: ET Tube Size: 8.0 ET Tube secured at lip (cm): 24 Bite block in place: No Breath Sounds: Clear and Diminished  Post Procedure Assessment: BP (!) 117/50   Pulse 78   Temp 98.8 F (37.1 C) (Bladder)   Resp 16   Ht 6\' 1"  (1.854 m)   Wt 98.2 kg   SpO2 100%   BMI 28.56 kg/m  O2 sats: stable throughout Complications: No apparent complications Patient did tolerate procedure well Tracheostomy Brand:Shiley Tracheostomy Style:Cuffed Tracheostomy Size:  8 Tracheostomy Secured XBM:[id number] & velcro Tracheostomy Placement Confirmation:Trach cuff visualized and in place and Chest X ray ordered for placement    Jacqulynn Cadet 06/06/2023, 12:28 PM

## 2023-06-06 NOTE — Progress Notes (Signed)
Patient with new tracheostomy. Orders for SLP eval and treat for PMSV and swallowing received. Will follow pt closely for readiness for SLP interventions as appropriate.   Angela Nevin, MA, CCC-SLP Speech Therapy

## 2023-06-06 NOTE — Progress Notes (Signed)
[facility] ADULT ICU REPLACEMENT PROTOCOL   The patient does apply for the [facility] Adult ICU Electrolyte Replacment Protocol based on the criteria listed below:   1.Exclusion criteria: TCTS, ECMO, Dialysis, and Myasthenia Gravis patients 2. Is GFR >/= 30 ml/min? Yes.    Patient's GFR today is >60 3. Is SCr </= 2? Yes.   Patient's SCr is 0.78 mg/dL 4. Did SCr increase >/= 0.5 in 24 hours? No. 5.Pt's weight >40kg  Yes.   6. Abnormal electrolyte(s): K  7. Electrolytes replaced per protocol 8.  Call MD STAT for K+ </= 2.5, Phos </= 1, or Mag </= 1 Physician:  Thersa Salt Tashon Capp 06/06/2023 3:29 AM

## 2023-06-06 NOTE — Procedures (Signed)
Percutaneous Tracheostomy Procedure Note   Joshua Eaton  [id number]  02-19-1956  Date:06/06/23  Time:11:59 AM   Provider Performing:Mareli Antunes  Procedure: Percutaneous Tracheostomy with Bronchoscopic Guidance (40981)  Indication(s) Acute respiratory failure  Consent Risks of the procedure as well as the alternatives and risks of each were explained to the patient and/or caregiver.  Consent for the procedure was obtained.  Anesthesia Etomidate, Versed, Fentanyl, Rocuronium   Time Out Verified patient identification, verified procedure, site/side was marked, verified correct patient position, special equipment/implants available, medications/allergies/relevant history reviewed, required imaging and test results available.   Sterile Technique Maximal sterile technique including sterile barrier drape, hand hygiene, sterile gown, sterile gloves, mask, hair covering.    Procedure Description Appropriate anatomy identified by palpation.  Patient's neck prepped and draped in sterile fashion.  1% lidocaine with epinephrine was used to anesthetize skin overlying neck.  1.5cm incision made and blunt dissection performed until tracheal rings could be easily palpated.   Then a size 8 Shiley tracheostomy was placed under bronchoscopic visualization using usual Seldinger technique and serial dilation.   Bronchoscope confirmed placement above the carina.  Tracheostomy was sutured in place with adhesive pad to protect skin under pressure.    Patient connected to ventilator.   Complications/Tolerance None; patient tolerated the procedure well. Chest X-ray is ordered to confirm no post-procedural complication.   EBL Minimal   Specimen(s) None

## 2023-06-07 DIAGNOSIS — J810 Acute pulmonary edema: Secondary | ICD-10-CM | POA: Diagnosis not present

## 2023-06-07 DIAGNOSIS — J9601 Acute respiratory failure with hypoxia: Secondary | ICD-10-CM | POA: Diagnosis not present

## 2023-06-07 DIAGNOSIS — Z9911 Dependence on respirator [ventilator] status: Secondary | ICD-10-CM | POA: Diagnosis not present

## 2023-06-07 LAB — GLUCOSE, CAPILLARY
Glucose-Capillary: 139 mg/dL — ABNORMAL HIGH (ref 70–99)
Glucose-Capillary: 156 mg/dL — ABNORMAL HIGH (ref 70–99)
Glucose-Capillary: 167 mg/dL — ABNORMAL HIGH (ref 70–99)
Glucose-Capillary: 163 mg/dL — ABNORMAL HIGH (ref 70–99)
Glucose-Capillary: 156 mg/dL — ABNORMAL HIGH (ref 70–99)
Glucose-Capillary: 146 mg/dL — ABNORMAL HIGH (ref 70–99)

## 2023-06-07 LAB — PHOSPHORUS: Phosphorus: 2.1 mg/dL — ABNORMAL LOW (ref 2.5–4.6)

## 2023-06-07 LAB — CBC
HCT: 31.9 % — ABNORMAL LOW (ref 39.0–52.0)
Hemoglobin: 10.1 g/dL — ABNORMAL LOW (ref 13.0–17.0)
MCH: 31 pg (ref 26.0–34.0)
MCHC: 31.7 g/dL (ref 30.0–36.0)
MCV: 97.9 fL (ref 80.0–100.0)
Platelets: 60 10*3/uL — ABNORMAL LOW (ref 150–400)
RBC: 3.26 MIL/uL — ABNORMAL LOW (ref 4.22–5.81)
RDW: 20.9 % — ABNORMAL HIGH (ref 11.5–15.5)
WBC: 9.9 10*3/uL (ref 4.0–10.5)
nRBC: 0 % (ref 0.0–0.2)

## 2023-06-07 LAB — COMPREHENSIVE METABOLIC PANEL
ALT: 15 U/L (ref 0–44)
AST: 28 U/L (ref 15–41)
Albumin: 1.8 g/dL — ABNORMAL LOW (ref 3.5–5.0)
Alkaline Phosphatase: 52 U/L (ref 38–126)
Anion gap: 12 (ref 5–15)
BUN: 38 mg/dL — ABNORMAL HIGH (ref 8–23)
CO2: 28 mmol/L (ref 22–32)
Calcium: 8.2 mg/dL — ABNORMAL LOW (ref 8.9–10.3)
Chloride: 103 mmol/L (ref 98–111)
Creatinine, Ser: 0.75 mg/dL (ref 0.61–1.24)
GFR, Estimated: >60 mL/min (ref >60)
Glucose, Bld: 149 mg/dL — ABNORMAL HIGH (ref 70–99)
Potassium: 3.4 mmol/L — ABNORMAL LOW (ref 3.5–5.1)
Sodium: 143 mmol/L (ref 135–145)
Total Bilirubin: 1.6 mg/dL — ABNORMAL HIGH (ref 0.3–1.2)
Total Protein: 4.8 g/dL — ABNORMAL LOW (ref 6.5–8.1)

## 2023-06-07 LAB — PREPARE PLATELET PHERESIS: Unit division: 0

## 2023-06-07 LAB — BPAM PLATELET PHERESIS
Blood Product Expiration Date: 202410062359
ISSUE DATE / TIME: 202410061044
Unit Type and Rh: 202410062359
Unit Type and Rh: 8400

## 2023-06-07 LAB — CULTURE, RESPIRATORY W GRAM STAIN: Gram Stain: NONE SEEN

## 2023-06-07 LAB — MAGNESIUM: Magnesium: 2.7 mg/dL — ABNORMAL HIGH (ref 1.7–2.4)

## 2023-06-07 LAB — TRIGLYCERIDES: Triglycerides: 124 mg/dL (ref <150)

## 2023-06-07 MED ORDER — POTASSIUM & SODIUM PHOSPHATES 280-160-250 MG PO PACK
2.0000 | PACK | ORAL | Status: AC
  Administered 2023-06-07 (×3): 2
  Filled 2023-06-07 (×3): qty 2

## 2023-06-07 MED ORDER — POTASSIUM CHLORIDE 20 MEQ PO PACK
40.0000 meq | PACK | ORAL | Status: AC
  Administered 2023-06-07 (×3): 40 meq
  Filled 2023-06-07 (×3): qty 2

## 2023-06-07 MED ORDER — POTASSIUM CHLORIDE 20 MEQ PO PACK
20.0000 meq | PACK | Freq: Once | ORAL | Status: AC
  Administered 2023-06-07: 20 meq
  Filled 2023-06-07: qty 1

## 2023-06-07 MED ORDER — FUROSEMIDE 10 MG/ML IJ SOLN
80.0000 mg | Freq: Three times a day (TID) | INTRAMUSCULAR | Status: AC
  Administered 2023-06-07 (×2): 80 mg via INTRAVENOUS
  Filled 2023-06-07 (×2): qty 8

## 2023-06-07 MED ORDER — DEXMEDETOMIDINE HCL IN NACL 400 MCG/100ML IV SOLN
0.0000 ug/kg/h | INTRAVENOUS | Status: DC
  Administered 2023-06-07: 0.6 ug/kg/h via INTRAVENOUS
  Administered 2023-06-07: 0.7 ug/kg/h via INTRAVENOUS
  Administered 2023-06-07 – 2023-06-08 (×2): 0.4 ug/kg/h via INTRAVENOUS
  Administered 2023-06-08: 0.7 ug/kg/h via INTRAVENOUS
  Administered 2023-06-08: 0.6 ug/kg/h via INTRAVENOUS
  Administered 2023-06-09: 0.7 ug/kg/h via INTRAVENOUS
  Filled 2023-06-07 (×7): qty 100

## 2023-06-07 MED ORDER — PANTOPRAZOLE SODIUM 40 MG IV SOLR
40.0000 mg | INTRAVENOUS | Status: DC
  Administered 2023-06-08 – 2023-06-09 (×2): 40 mg via INTRAVENOUS
  Filled 2023-06-07 (×2): qty 10

## 2023-06-07 MED ORDER — POTASSIUM PHOSPHATES 15 MMOLE/5ML IV SOLN
15.0000 mmol | Freq: Once | INTRAVENOUS | Status: AC
  Administered 2023-06-07: 15 mmol via INTRAVENOUS
  Filled 2023-06-07: qty 5

## 2023-06-07 NOTE — Progress Notes (Signed)
PT Cancellation Note  Patient Details Name: Joshua Eaton MRN: [id number] DOB: Nov 16, 1955   Cancelled Treatment:    Reason Eval/Treat Not Completed: Patient not medically ready (Pt with sedation off, on vent but currently unresponsive. Not currently able to participate in therapy)   Joshua Eaton 06/07/2023, 7:55 AM Merryl Hacker, PT Acute Rehabilitation Services Office: [phone]

## 2023-06-07 NOTE — Progress Notes (Signed)
[facility] ADULT ICU REPLACEMENT PROTOCOL   The patient does apply for the [facility] Adult ICU Electrolyte Replacment Protocol based on the criteria listed below:   1.Exclusion criteria: TCTS, ECMO, Dialysis, and Myasthenia Gravis patients 2. Is GFR >/= 30 ml/min? Yes.    Patient's GFR today is >60 3. Is SCr </= 2? Yes.   Patient's SCr is 0.75 mg/dL 4. Did SCr increase >/= 0.5 in 24 hours? No. 5.Pt's weight >40kg  Yes.   6. Abnormal electrolyte(s): Phos, K  7. Electrolytes replaced per protocol 8.  Call MD STAT for K+ </= 2.5, Phos </= 1, or Mag </= 1 Physician:  Halina Andreas E Jewelz Kobus 06/07/2023 4:06 AM

## 2023-06-07 NOTE — Progress Notes (Signed)
NAME:  Joshua Eaton, MRN:  [id number], DOB:  01-Oct-1955, LOS: 4 ADMISSION DATE:  06/03/2023, CONSULTATION DATE:  06/02/2023 REFERRING MD:  Dr. Nadine Counts [facility] , CHIEF COMPLAINT: Pneumonia  History of Present Illness:  This is a 67 year old male with a past medical history of colon cancer status post right hemicolectomy 2007, COPD, mitral valve Regurgitation who presented to [facility] on 05/20/2023 for shortness of breath. Prior to presenting, patient was feeling unwell for about 6 days with cough, shortness of breath. He had an initial CXR showing bilateral airspace opacities and was eventually placed on BiPAP and admitted to ICU. On 9/20 he was intubated due to ongoing respiratory failure and antibiotics were widened for multifocal pneumonia. He had echo performed showing EF 60-65%, severe dilated LA, severe MR. Throughout his course at [facility] had ongoing ARDS picture, developed worsening AKI. Eventually, [facility] PCCM team was consulted for transfer here due to ongoing high vent requirements, unable to wean. Additionally, there was concern over need for MV replacement and possible need for right heart cath.   Pertinent  Medical History  Colon cancer, status post hemicolectomy (right) 2007, questionable COPD wears CPAP at home, mitral valve regurgitation   Significant Hospital Events: Including procedures, antibiotic start and stop dates in addition to other pertinent events   Admitted to [facility] health on 05/20/2023 06/03/2023: Transferred from [facility] to [facility] 10/5 failed SBT 10/06: Tracheostomy   Interim History / Subjective:  No overnight events.   Patient evaluated at bedside this morning. Patient is not able to participate in the physician-patient interview as he is intubated and sedated.  Objective   Blood pressure (!) 127/45, pulse 79, temperature 99 F (37.2 C), resp. rate (!) 22, height 6\' 1"  (1.854 m), weight 94.4 kg, SpO2 97%.  Blood pressure with maps between  73-81 off pressors, did require 2 of Levophed at 1 point, satting 97-100% on ventilator settings, afebrile, pulse 70s Vent Mode: PRVC FiO2 (%):  [40 %-100 %] 40 % Set Rate:  [16 bmp] 16 bmp Vt Set:  [640 mL] 640 mL PEEP:  [5 cmH20] 5 cmH20 Plateau Pressure:  [22 cmH20-24 cmH20] 22 cmH20   Intake/Output Summary (Last 24 hours) at 06/07/2023 0651 Last data filed at 06/07/2023 0600 Gross per 24 hour  Intake 2694.52 ml  Output 4585 ml  Net -1890.48 ml   Filed Weights   06/05/23 0213 06/06/23 0127 06/07/23 0256  Weight: 102.6 kg 98.2 kg 94.4 kg    Examination: General: Critically ill, intubated, sedated, tracheostomy in place HENT: Tracheostomy in place Lungs: Decreased breath sounds bilaterally Cardiovascular: Regular rate and rhythm, systolic murmur appreciated Abdomen: Soft, nontender, nondistended Extremities: Diffuse edema appreciated to bilateral upper and lower extremities Neuro: Intubated, sedated, not withdrawing to pain GU: Foley in place draining well  Labs reviewed: CBC9.9 white count, hemoglobin 10.1, platelets 60 Magnesium 2.7 Phosphorus 2.1 CMP sodium 143, potassium 3.4, bicarb 28, BUN 38, creatinine 0.75, albumin 1.8, total bili 1.6  Glucose 139-169  Chest x-ray: Tracheostomy terminates 30 cm above carina, stable interstitial and airspace opacities  Resolved Hospital Problem list   AKI GI bleed   Assessment & Plan:  This is a 67 year old male with past medical history of mitral valve regurgitation, colon cancer status post hemicolectomy in 2007 who presents for concerns of this of breath.  Patient admitted for further evaluation and management acute hypoxemic respiratory failure  #Acute hypoxemic respiratory failure status post tracheostomy still requiring mechanical ventilation #Pulmonary edema secondary to HFpEF exacerbation  Patient is status post tracheostomy at this time.  Still requiring ventilator.  Not passing SBT.  Patient has significant volume  overload.  Will need to continue diuresis.  Patient is no longer on antibiotics as likely this is more related to volume overload. -Lasix 80 mg every 8 hours today x two doses  -Strict I's and O's -Daily weights -Wean off sedation as tolerated  #Severe mitral regurgitation #Mild to moderate aortic valve insufficiency Mitral regurg likely causing the pulmonary edema.  Patient status post Lasix 80 mg twice daily yesterday.  Patient had output of about 1.89 L yesterday.  Chest x-ray yesterday did shows some improvement of pulmonary edema.  Will continue with Lasix 80 mg twice daily.  Output has been well.  Will continue to be on mechanical ventilation. -Continue diuresing as above -Can evaluate mitral valve with TEE once patient is more stable  #History of hypertension Patient did require pressors at 2 mics of Levophed, this is bit weaned off.  Patient is currently on losartan 25 mg daily and hydralazine 25 mg every 8 hours.  Blood pressure is measuring well. -Patient has been weaned off all pressors -Continue hydralazine 25 mg every 8 hours -Continue losartan 25 mg daily  #Thrombocytopenia Thrombocytopenia likely in setting of critical illness.  Will continue to monitor.  Patient is status post 1 packed unit of platelets. Platelets stable at 60 this morning.  Still low.  No signs of bleeding. -Monitor CBC  #Nutrition -Continue tube feeds  #Hypophosphatemia Phosphorus low at 2.1, will replete.  Recheck in the morning.  #BPH -Foley still in place draining well   #Hyperbilirubinemia Stably elevated, but no acute concerns.  Will continue to monitor.  This is likely congestive hepatopathy. -Likely should improve with diuresis   Best Practice (right click and "Reselect all SmartList Selections" daily)   Diet/type: tubefeeds DVT prophylaxis: Heparin GI prophylaxis: PPI Lines: Midline  Foley:  Yes, and it is still needed Code Status:  full code  Labs   CBC: Recent Labs  Lab  06/03/23 2112 06/04/23 0902 06/05/23 0209 06/06/23 0228 06/07/23 0305  WBC 10.2 11.3* 8.7 8.7 9.9  HGB 9.8* 10.2* 9.5* 9.4* 10.1*  HCT 30.8* 31.6* 30.2* 29.2* 31.9*  MCV 101.3* 100.3* 98.7 99.7 97.9  PLT 49* 55* 52* 50* 60*    Basic Metabolic Panel: Recent Labs  Lab 06/03/23 2112 06/04/23 0902 06/04/23 1743 06/05/23 0209 06/06/23 0228 06/06/23 1436 06/07/23 0305  NA 143 145  --  146* 144 144 143  K 4.5 4.1  --  3.0* 3.2* 4.0 3.4*  CL 111 112*  --  110 107 108 103  CO2 21* 24  --  27 28 27 28   GLUCOSE 128* 116*  --  167* 108* 120* 149*  BUN 40* 43*  --  46* 40* 42* 38*  CREATININE 0.98 0.98  --  0.95 0.78 0.77 0.75  CALCIUM 8.6* 8.8*  --  8.3* 7.9* 8.2* 8.2*  MG 2.3 2.1 2.0 2.0  --  2.8* 2.7*  PHOS 2.6 2.3* 2.5 2.8  --   --  2.1*   GFR: Estimated Creatinine Clearance: 98.5 mL/min (by C-G formula based on SCr of 0.75 mg/dL). Recent Labs  Lab 06/03/23 2112 06/04/23 0902 06/05/23 0209 06/06/23 0228 06/07/23 0305  PROCALCITON 0.27  --   --   --   --   WBC 10.2 11.3* 8.7 8.7 9.9    Liver Function Tests: Recent Labs  Lab 06/03/23 2112 06/05/23 0209 06/07/23 0305  AST 30 28 28   ALT 14 14 15   ALKPHOS 43 50 52  BILITOT 2.1* 1.6* 1.6*  PROT 4.5* 4.3* 4.8*  ALBUMIN 1.9* 1.7* 1.8*   No results for input(s): "LIPASE", "AMYLASE" in the last 168 hours. No results for input(s): "AMMONIA" in the last 168 hours.  ABG    Component Value Date/Time   HCO3 28.6 (H) 06/03/2023 2229   O2SAT 87.8 06/03/2023 2229     Coagulation Profile: No results for input(s): "INR", "PROTIME" in the last 168 hours.  Cardiac Enzymes: No results for input(s): "CKTOTAL", "CKMB", "CKMBINDEX", "TROPONINI" in the last 168 hours.  HbA1C: Hgb A1c MFr Bld  Date/Time Value Ref Range Status  12/09/2015 06:30 PM 5.1 4.8 - 5.6 % Final    Comment:    (NOTE)         Pre-diabetes: 5.7 - 6.4         Diabetes: >6.4         Glycemic control for adults with diabetes: <7.0     CBG: Recent  Labs  Lab 06/06/23 1107 06/06/23 1509 06/06/23 1927 06/06/23 2317 06/07/23 0304  GLUCAP 145* 120* 156* 169* 139*    Review of Systems:   Negative Except for what is stated in HPI   Past Medical History:  He,  has a past medical history of Anxiety, Cancer (HCC), Depression, Hypertension, and OCD (obsessive compulsive disorder).   Surgical History:   Past Surgical History:  Procedure Laterality Date   CHOLECYSTECTOMY     COLON SURGERY     cancerous polyp removed   HERNIA REPAIR       Social History:   reports that he has never smoked. He has never used smokeless tobacco. He reports that he does not drink alcohol and does not use drugs.   Family History:  His family history is not on file.   Allergies Allergies  Allergen Reactions   Codeine     "sends me into orbit"     Home Medications  Prior to Admission medications   Medication Sig Start Date End Date Taking? Authorizing Provider  acetaminophen (TYLENOL) 325 MG tablet Take 650 mg by mouth every 6 (six) hours as needed for moderate pain.   Yes [provider]  aspirin EC 81 MG tablet Take 81 mg by mouth daily. Swallow whole.   Yes [provider]  benzonatate (TESSALON) 200 MG capsule Take 200 mg by mouth 3 (three) times daily as needed for cough. 05/11/23  Yes [provider]  budesonide (PULMICORT) 0.5 MG/2ML nebulizer solution Take 0.5 mg by nebulization 2 (two) times daily.   Yes [provider]  chlorhexidine (PERIDEX) 0.12 % solution Use as directed 15 mLs in the mouth or throat 2 (two) times daily.   Yes [provider]  dexmedeTOMIDine HCl (PRECEDEX IV) Inject 1 Dose into the vein every 12 (twelve) hours. in 100 mL 10.28mL/hr Q12H   Yes [provider]  feeding supplement (OSMOLITE 1 CAL) LIQD Take 1,000 mLs by mouth daily. 68mL/hr   Yes [provider]  fentaNYL citrate (PF) 10 mcg/mL in sodium chloride 0.9 % 200 mL Inject 50 mcg/hr into  the vein See admin instructions. 2,5109mcg in Rate: 21mL/hr Freq: Q12H   Yes [provider]  guaiFENesin-Codeine 200-20 MG/10ML SOLN Take 10 mLs by mouth every 6 (six) hours as needed (Cough).   Yes [provider]  ipratropium-albuterol (DUONEB) 0.5-2.5 (3) MG/3ML SOLN Take 3 mLs by nebulization every 6 (six)  hours.   Yes [provider]  levofloxacin (LEVAQUIN) 750 MG/150ML SOLN Inject 750 mg into the vein daily. Rate: 18ml/hr Dur: 90 minutes Freq: Q24H Started 05/31/23 End 06/03/23   Yes [provider]  LORazepam (ATIVAN) 0.5 MG tablet Take 0.5 mg by mouth in the morning.   Yes [provider]  LORazepam (ATIVAN) 1 MG tablet Take 1 mg by mouth at bedtime.   Yes [provider]  LORazepam (ATIVAN) 2 MG/ML injection Inject 0.5 mg into the vein every 6 (six) hours as needed for anxiety.   Yes [provider]  methylPREDNISolone acetate (DEPO-MEDROL) 40 MG/ML injection 20 mg every 12 (twelve) hours. Started 05/29/23 End 06/12/23   Yes [provider]  METOCLOPRAMIDE HCL IJ Inject 10 mg into the vein every 6 (six) hours.   Yes [provider]  Multiple Vitamins-Minerals (MULTIVITAL PO) Take 5 mLs by mouth in the morning and at bedtime.   Yes [provider]  OLANZapine (ZYPREXA) 2.5 MG tablet Take 1 tablet (2.5 mg total) by mouth at bedtime. 01/20/23  Yes Cottle, Steva Ready., MD  ondansetron [facility]) 4 MG/2ML SOLN injection Inject 4 mg into the vein every 6 (six) hours as needed for nausea or vomiting.   Yes [provider]  pantoprazole 40 mg in sodium chloride 0.9 % 100 mL Inject 40 mg into the vein every 12 (twelve) hours.   Yes [provider]  polyethylene glycol (MIRALAX / GLYCOLAX) 17 g packet Take 17 g by mouth 2 (two) times daily as needed for moderate constipation. Give with lunch and evening meal if no BM during the day as needed   Yes [provider]  promethazine  (PHENERGAN) 25 MG/ML injection Inject 12.5 mg into the muscle every 6 (six) hours as needed for nausea or vomiting.   Yes [provider]  propofol (DIPRIVAN) 1000 MG/100ML EMUL injection Inject 2.939 mLs into the vein See admin instructions. Rate: 2.970mL/hr Dur: 34hr 2 min Freq: Every 6 hours   Yes [provider]  propranolol (INDERAL) 20 MG tablet TAKE 1 TO 2 TABLETS BY MOUTH  TWICE DAILY AS NEEDED 01/21/23  Yes Cottle, Steva Ready., MD  sennosides-docusate sodium (SENOKOT-S) 8.6-50 MG tablet Take 1 tablet by mouth 2 (two) times daily as needed for constipation.   Yes [provider]  zolpidem (AMBIEN) 5 MG tablet Take 5 mg by mouth at bedtime as needed for sleep.   Yes [provider]  doxycycline (VIBRAMYCIN) 100 MG capsule Take 100 mg by mouth 2 (two) times daily. Patient not taking: Reported on 06/05/2023 05/14/23   [provider]  gabapentin (NEURONTIN) 100 MG capsule Take 300 mg by mouth at bedtime. Patient not taking: Reported on 06/05/2023 12/11/15   [provider]  loratadine (CLARITIN) 10 MG tablet Take 10 mg by mouth daily as needed for allergies. Patient not taking: Reported on 06/05/2023 05/11/23   [provider]  meloxicam (MOBIC) 7.5 MG tablet Take 7.5 mg by mouth 2 (two) times daily. Patient not taking: Reported on 06/05/2023 04/04/23   [provider]  methocarbamol (ROBAXIN) 500 MG tablet Take 500 mg by mouth 2 (two) times daily as needed for muscle spasms. Patient not taking: Reported on 06/05/2023 04/04/23   [provider]  predniSONE (DELTASONE) 20 MG tablet Take 10-40 mg by mouth See admin instructions. AKE 2 TABLETS (40mg ) BY MOUTH ONCE DAILY UNTIL SYMPTOMS IMPROVE, THEN DECREASE TO 1/2 TABLET (10mg ) EVERY OTHER DAY Patient  not taking: Reported on 06/05/2023 05/14/23   [provider]  promethazine-dextromethorphan (PROMETHAZINE-DM) 6.25-15 MG/5ML syrup Take 5 mLs by mouth every 6 (six) hours  as needed for cough. Patient not taking: Reported on 06/05/2023 05/11/23   [provider]  sertraline (ZOLOFT) 100 MG tablet TAKE 2 TABLETS BY MOUTH IN THE  MORNING AND 1 TABLET BY MOUTH IN THE EVENING 03/17/23   Cottle, Steva Ready., MD     Critical care time: 35 mins     Modena Slater, DO Internal Medicine Resident PGY-2 Pager: [phone]

## 2023-06-07 NOTE — Progress Notes (Signed)
RT NOTE: patient placed on CPAP/PSV of 12/5 at 0800.  Currently tolerating well.  Will continue to monitor.

## 2023-06-08 DIAGNOSIS — J9601 Acute respiratory failure with hypoxia: Secondary | ICD-10-CM | POA: Diagnosis not present

## 2023-06-08 LAB — CBC
HCT: 30.5 % — ABNORMAL LOW (ref 39.0–52.0)
Hemoglobin: 9.5 g/dL — ABNORMAL LOW (ref 13.0–17.0)
MCH: 30.9 pg (ref 26.0–34.0)
MCHC: 31.1 g/dL (ref 30.0–36.0)
MCV: 99.3 fL (ref 80.0–100.0)
Platelets: 53 10*3/uL — ABNORMAL LOW (ref 150–400)
RBC: 3.07 MIL/uL — ABNORMAL LOW (ref 4.22–5.81)
RDW: 20.8 % — ABNORMAL HIGH (ref 11.5–15.5)
WBC: 7.3 10*3/uL (ref 4.0–10.5)
nRBC: 0 % (ref 0.0–0.2)

## 2023-06-08 LAB — CULTURE, RESPIRATORY W GRAM STAIN

## 2023-06-08 LAB — BASIC METABOLIC PANEL
Anion gap: 8 (ref 5–15)
BUN: 43 mg/dL — ABNORMAL HIGH (ref 8–23)
CO2: 27 mmol/L (ref 22–32)
Calcium: 7.6 mg/dL — ABNORMAL LOW (ref 8.9–10.3)
Chloride: 108 mmol/L (ref 98–111)
Creatinine, Ser: 0.99 mg/dL (ref 0.61–1.24)
GFR, Estimated: >60 mL/min (ref >60)
Glucose, Bld: 157 mg/dL — ABNORMAL HIGH (ref 70–99)
Potassium: 4 mmol/L (ref 3.5–5.1)
Sodium: 143 mmol/L (ref 135–145)

## 2023-06-08 LAB — GLUCOSE, CAPILLARY
Glucose-Capillary: 160 mg/dL — ABNORMAL HIGH (ref 70–99)
Glucose-Capillary: 145 mg/dL — ABNORMAL HIGH (ref 70–99)
Glucose-Capillary: 134 mg/dL — ABNORMAL HIGH (ref 70–99)
Glucose-Capillary: 133 mg/dL — ABNORMAL HIGH (ref 70–99)
Glucose-Capillary: 153 mg/dL — ABNORMAL HIGH (ref 70–99)
Glucose-Capillary: 136 mg/dL — ABNORMAL HIGH (ref 70–99)

## 2023-06-08 LAB — MAGNESIUM: Magnesium: 2.4 mg/dL (ref 1.7–2.4)

## 2023-06-08 MED ORDER — ACETAMINOPHEN 325 MG PO TABS
650.0000 mg | ORAL_TABLET | Freq: Four times a day (QID) | ORAL | Status: DC | PRN
  Administered 2023-06-08: 650 mg
  Filled 2023-06-08: qty 2

## 2023-06-08 MED ORDER — CLONAZEPAM 0.25 MG PO TBDP
0.5000 mg | ORAL_TABLET | Freq: Two times a day (BID) | ORAL | Status: DC
  Administered 2023-06-08 (×2): 0.5 mg
  Filled 2023-06-08 (×3): qty 2

## 2023-06-08 MED ORDER — SODIUM CHLORIDE 0.9 % IV SOLN: INTRAVENOUS | Status: DC

## 2023-06-08 MED ORDER — DOCUSATE SODIUM 50 MG/5ML PO LIQD: 100.0000 mg | Freq: Two times a day (BID) | ORAL | Status: DC

## 2023-06-08 MED ORDER — INSULIN ASPART 100 UNIT/ML IJ SOLN
2.0000 [IU] | INTRAMUSCULAR | Status: DC
  Administered 2023-06-08 – 2023-06-28 (×100): 2 [IU] via SUBCUTANEOUS

## 2023-06-08 MED ORDER — FENTANYL CITRATE PF 50 MCG/ML IJ SOSY
25.0000 ug | PREFILLED_SYRINGE | INTRAMUSCULAR | Status: DC | PRN
  Administered 2023-06-08 (×2): 100 ug via INTRAVENOUS
  Administered 2023-06-09 (×2): 50 ug via INTRAVENOUS
  Administered 2023-06-10 (×2): 100 ug via INTRAVENOUS
  Administered 2023-06-10: 50 ug via INTRAVENOUS
  Filled 2023-06-08 (×2): qty 1
  Filled 2023-06-08 (×3): qty 2
  Filled 2023-06-08: qty 1

## 2023-06-08 MED ORDER — METOLAZONE 2.5 MG PO TABS
5.0000 mg | ORAL_TABLET | Freq: Once | ORAL | Status: AC
  Administered 2023-06-08: 5 mg
  Filled 2023-06-08: qty 2

## 2023-06-08 MED ORDER — FUROSEMIDE 10 MG/ML IJ SOLN
80.0000 mg | Freq: Three times a day (TID) | INTRAMUSCULAR | Status: AC
  Administered 2023-06-08 (×2): 80 mg via INTRAVENOUS
  Filled 2023-06-08 (×2): qty 8

## 2023-06-08 MED ORDER — LOPERAMIDE HCL 1 MG/7.5ML PO SUSP
2.0000 mg | ORAL | Status: DC | PRN
  Administered 2023-06-26: 2 mg
  Filled 2023-06-08 (×2): qty 15

## 2023-06-08 MED ORDER — POTASSIUM CHLORIDE 20 MEQ PO PACK
40.0000 meq | PACK | Freq: Once | ORAL | Status: AC
  Administered 2023-06-08: 40 meq
  Filled 2023-06-08: qty 2

## 2023-06-08 MED ORDER — POLYETHYLENE GLYCOL 3350 17 G PO PACK: 17.0000 g | PACK | Freq: Every day | ORAL | Status: DC

## 2023-06-08 NOTE — Progress Notes (Signed)
eLink Physician-Brief Progress Note Patient Name: Joshua Eaton DOB: 11-Oct-1955 MRN: [id number]   Date of Service  06/08/2023  HPI/Events of Note  67 year old that has severe MR with flail leaflet in the setting of heart failure and respiratory failure requiring tracheostomy  Intermittently febrile throughout the day.  Last set of blood cultures on 10/6 no growth to date  eICU Interventions  Anticipating TEE in the morning  Repeat blood cultures Tylenol as needed per tube   0021 -mild hypotension after scheduled hydralazine, diuresis.  Will attempt albumin 25% bolus 12.5 g.  If ineffective, low-dose peripheral phenylephrine for MAP greater than 65.  Notable wide pulse pressures  Intervention Category Minor Interventions: Routine modifications to care plan (e.g. PRN medications for pain, fever)  Kamal Jurgens 06/08/2023, 9:32 PM

## 2023-06-08 NOTE — Progress Notes (Signed)
SLP Cancellation Note  Patient Details Name: Joshua Eaton MRN: [id number] DOB: 1955-11-25   Cancelled treatment:       Reason Eval/Treat Not Completed: Patient not medically ready. Patient remains on vent, not responsive. SLP will continue to follow for readiness.   Angela Nevin, MA, CCC-SLP Speech Therapy

## 2023-06-08 NOTE — TOC Initial Note (Signed)
Transition of Care [facility]) - Initial/Assessment Note    Patient Details  Name: Joshua Eaton MRN: [id number] Date of Birth: 11/11/1955  [facility] CM/SW Contact:    Lorri Frederick, LCSW Phone Number: 06/08/2023, 11:46 AM  Clinical Narrative:     CSW spoke with pt wife [name] by phone regarding LTAC choice, discussed [facility] options.  [name] would prefer pt stay in the [facility] builiding and would like to choose [facility].    Pt from home with [name], no current services.  [facility] notified.                Expected Discharge Plan: Long Term Acute Care (LTAC) Barriers to Discharge: Other (must enter comment), Continued Medical Work up [employer] [facility])   Patient Goals and CMS Choice     Choice offered to / list presented to : Spouse      Expected Discharge Plan and Services In-house Referral: Clinical Social Work   Post Acute Care Choice: Long Term Acute Care (LTAC) Living arrangements for the past 2 months: Single Family Home                                      Prior Living Arrangements/Services Living arrangements for the past 2 months: Single Family Home Lives with:: Spouse Patient language and need for interpreter reviewed:: No        Need for Family Participation in Patient Care: Yes (Comment) Care giver support system in place?: Yes (comment) Current home services: Other (comment) (no services) Criminal Activity/Legal Involvement Pertinent to Current Situation/Hospitalization: No - Comment as needed  Activities of Daily Living      Permission Sought/Granted                  Emotional Assessment Appearance:: Appears stated age Attitude/Demeanor/Rapport: Unable to Assess Affect (typically observed): Unable to Assess Orientation: :  (intubated)      Admission diagnosis:  Acute respiratory failure with hypoxia (HCC) [J96.01] Patient Active Problem List   Diagnosis Date Noted   On mechanically assisted  ventilation (HCC) 06/04/2023   Acute pulmonary edema (HCC) 06/04/2023   Acute respiratory failure with hypoxia (HCC) 06/03/2023   Severe mitral regurgitation 06/03/2023   AKI (acute kidney injury) (HCC) 06/03/2023   Insomnia 08/12/2018   Anxiety 08/12/2018   Severe recurrent major depression without psychotic features (HCC) 02/04/2016   Obsessive compulsive disorder 02/04/2016   PCP:  Kaleen Mask, MD Pharmacy:   [facility] - [place], [place] - [address] [place] [address] Phone: [phone] Fax: [phone]     Social Determinants of Health (SDOH) Social History: SDOH Screenings   Tobacco Use: Low Risk  (01/20/2023)   SDOH Interventions:     Readmission Risk Interventions     No data to display

## 2023-06-08 NOTE — Progress Notes (Signed)
NAME:  Joshua Eaton, MRN:  [id number], DOB:  1955/10/24, LOS: 5 ADMISSION DATE:  06/03/2023, CONSULTATION DATE:  06/02/2023 REFERRING MD:  Dr. Nadine Counts [facility] , CHIEF COMPLAINT: Pneumonia  History of Present Illness:  This is a 67 year old male with a past medical history of colon cancer status post right hemicolectomy 2007, COPD, mitral valve Regurgitation who presented to [facility] health on 05/20/2023 for shortness of breath. Prior to presenting, patient was feeling unwell for about 6 days with cough, shortness of breath. He had an initial CXR showing bilateral airspace opacities and was eventually placed on BiPAP and admitted to ICU. On 9/20 he was intubated due to ongoing respiratory failure and antibiotics were widened for multifocal pneumonia. He had echo performed showing EF 60-65%, severe dilated LA, severe MR. Throughout his course at [facility] had ongoing ARDS picture, developed worsening AKI. Eventually, [facility] PCCM team was consulted for transfer here due to ongoing high vent requirements, unable to wean. Additionally, there was concern over need for MV replacement and possible need for right heart cath.   Pertinent  Medical History  Colon cancer, status post hemicolectomy (right) 2007, questionable COPD wears CPAP at home, mitral valve regurgitation   Significant Hospital Events: Including procedures, antibiotic start and stop dates in addition to other pertinent events   Admitted to [facility] health on 05/20/2023 06/03/2023: Transferred from [facility] to [facility] 10/5 failed SBT 10/06: Tracheostomy   Interim History / Subjective:  No overnight events.   Patient evaluated at bedside this morning.  Patient is intubated.  Able to follow some instructions.  Not participating in physician-patient interview  Objective   Blood pressure (!) 110/43, pulse (!) 55, temperature 99.5 F (37.5 C), resp. rate (!) 22, height 6\' 1"  (1.854 m), weight 94.4 kg, SpO2 99%.  Vent Mode:  PSV;CPAP FiO2 (%):  [40 %] 40 % Set Rate:  [16 bmp] 16 bmp Vt Set:  [640 mL] 640 mL PEEP:  [5 cmH20] 5 cmH20 Pressure Support:  [10 cmH20-12 cmH20] 10 cmH20 Plateau Pressure:  [18 cmH20-22 cmH20] 18 cmH20   Intake/Output Summary (Last 24 hours) at 06/08/2023 0748 Last data filed at 06/08/2023 0600 Gross per 24 hour  Intake 3072.45 ml  Output 3870 ml  Net -797.55 ml   Filed Weights   06/06/23 0127 06/07/23 0256 06/08/23 0213  Weight: 98.2 kg 94.4 kg 94.4 kg    Examination: General: Critically ill, intubated, sedated, tracheostomy in place HENT: Tracheostomy in place Lungs: Crackles heard throughout, decreased breath sounds bilaterally, no wheezing  Cardiovascular: Bradycardic, regular rhythm, grade 3/6 systolic murmur heard throughout Abdomen: Soft, nontender, nondistended Extremities: Diffuse edema appreciated to bilateral upper and lower extremities Neuro: Intubated, following some instructions, able to withdraw to pain GU: Foley in place draining well  Resolved Hospital Problem list   AKI GI bleed  Hypophosphatemia  Assessment & Plan:  This is a 67 year old male with past medical history of mitral valve regurgitation, colon cancer status post hemicolectomy in 2007 who presents for concerns of this of breath.  Patient admitted for further evaluation and management acute hypoxemic respiratory failure  #Acute hypoxemic respiratory failure status post tracheostomy still requiring mechanical ventilation #Pulmonary edema secondary to HFpEF exacerbation Patient is day by day improving.  Did wean today and did well.  Transition to trach collar.  However, patient did get a little agitated, required him to go back onto mechanical ventilation.  Will continue to work with patient.  Do feel that Lasix is helping patient.  Will  continue with diuresing.  -Lasix 80 mg every 8 hours today x two doses  -Strict I's and O's -Daily weights -Will wean off sedation, plan to use Klonopin to help  augment  #Severe mitral regurgitation #Mild to moderate aortic valve insufficiency Plan to consult cardiology today to evaluate for TEE. -Consult cardiology today-appreciate recommendations  #History of hypertension Patient blood pressure is measuring well at this time.  Currently on hydralazine 25 mg every 8 hours and losartan 25 mg daily.   -Continue hydralazine 25 mg every 8 hours -Continue losartan 25 mg daily  #Thrombocytopenia Thrombocytopenia likely in setting of critical illness.  Platelets at 53 today.  No signs of bleeding. -Monitor CBC  #Nutrition -Continue tube feeds -Replete electrolytes as needed  #BPH -Foley still in place draining well   #Hyperbilirubinemia Stably elevated, but no acute concerns.  Will continue to monitor.  This is likely congestive hepatopathy. -Likely should improve with diuresis  Consulted transition of care team for LTAC placement.  Best Practice (right click and "Reselect all SmartList Selections" daily)   Diet/type: tubefeeds DVT prophylaxis: Heparin GI prophylaxis: PPI Lines: Midline  Foley:  Yes, and it is still needed Code Status:  full code  Labs   CBC: Recent Labs  Lab 06/04/23 0902 06/05/23 0209 06/06/23 0228 06/07/23 0305 06/08/23 0354  WBC 11.3* 8.7 8.7 9.9 7.3  HGB 10.2* 9.5* 9.4* 10.1* 9.5*  HCT 31.6* 30.2* 29.2* 31.9* 30.5*  MCV 100.3* 98.7 99.7 97.9 99.3  PLT 55* 52* 50* 60* 53*    Basic Metabolic Panel: Recent Labs  Lab 06/03/23 2112 06/04/23 0902 06/04/23 1743 06/05/23 0209 06/06/23 0228 06/06/23 1436 06/07/23 0305 06/08/23 0354  NA 143 145  --  146* 144 144 143 143  K 4.5 4.1  --  3.0* 3.2* 4.0 3.4* 4.0  CL 111 112*  --  110 107 108 103 108  CO2 21* 24  --  27 28 27 28 27   GLUCOSE 128* 116*  --  167* 108* 120* 149* 157*  BUN 40* 43*  --  46* 40* 42* 38* 43*  CREATININE 0.98 0.98  --  0.95 0.78 0.77 0.75 0.99  CALCIUM 8.6* 8.8*  --  8.3* 7.9* 8.2* 8.2* 7.6*  MG 2.3 2.1 2.0 2.0  --  2.8* 2.7*  2.4  PHOS 2.6 2.3* 2.5 2.8  --   --  2.1*  --    GFR: Estimated Creatinine Clearance: 79.6 mL/min (by C-G formula based on SCr of 0.99 mg/dL). Recent Labs  Lab 06/03/23 2112 06/04/23 0902 06/05/23 0209 06/06/23 0228 06/07/23 0305 06/08/23 0354  PROCALCITON 0.27  --   --   --   --   --   WBC 10.2   < > 8.7 8.7 9.9 7.3   < > = values in this interval not displayed.    Liver Function Tests: Recent Labs  Lab 06/03/23 2112 06/05/23 0209 06/07/23 0305  AST 30 28 28   ALT 14 14 15   ALKPHOS 43 50 52  BILITOT 2.1* 1.6* 1.6*  PROT 4.5* 4.3* 4.8*  ALBUMIN 1.9* 1.7* 1.8*   No results for input(s): "LIPASE", "AMYLASE" in the last 168 hours. No results for input(s): "AMMONIA" in the last 168 hours.  ABG    Component Value Date/Time   HCO3 28.6 (H) 06/03/2023 2229   O2SAT 87.8 06/03/2023 2229     Coagulation Profile: No results for input(s): "INR", "PROTIME" in the last 168 hours.  Cardiac Enzymes: No results for input(s): "CKTOTAL", "CKMB", "  CKMBINDEX", "TROPONINI" in the last 168 hours.  HbA1C: Hgb A1c MFr Bld  Date/Time Value Ref Range Status  12/09/2015 06:30 PM 5.1 4.8 - 5.6 % Final    Comment:    (NOTE)         Pre-diabetes: 5.7 - 6.4         Diabetes: >6.4         Glycemic control for adults with diabetes: <7.0     CBG: Recent Labs  Lab 06/07/23 1511 06/07/23 1914 06/07/23 2310 06/08/23 0315 06/08/23 0728  GLUCAP 163* 156* 146* 160* 145*    Review of Systems:   Negative Except for what is stated in HPI   Past Medical History:  He,  has a past medical history of Anxiety, Cancer (HCC), Depression, Hypertension, and OCD (obsessive compulsive disorder).   Surgical History:   Past Surgical History:  Procedure Laterality Date   CHOLECYSTECTOMY     COLON SURGERY     cancerous polyp removed   HERNIA REPAIR       Social History:   reports that he has never smoked. He has never used smokeless tobacco. He reports that he does not drink alcohol and  does not use drugs.   Family History:  His family history is not on file.   Allergies Allergies  Allergen Reactions   Codeine     "sends me into orbit"     Home Medications  Prior to Admission medications   Medication Sig Start Date End Date Taking? Authorizing Provider  acetaminophen (TYLENOL) 325 MG tablet Take 650 mg by mouth every 6 (six) hours as needed for moderate pain.   Yes [provider]  aspirin EC 81 MG tablet Take 81 mg by mouth daily. Swallow whole.   Yes [provider]  benzonatate (TESSALON) 200 MG capsule Take 200 mg by mouth 3 (three) times daily as needed for cough. 05/11/23  Yes [provider]  budesonide (PULMICORT) 0.5 MG/2ML nebulizer solution Take 0.5 mg by nebulization 2 (two) times daily.   Yes [provider]  chlorhexidine (PERIDEX) 0.12 % solution Use as directed 15 mLs in the mouth or throat 2 (two) times daily.   Yes [provider]  dexmedeTOMIDine HCl (PRECEDEX IV) Inject 1 Dose into the vein every 12 (twelve) hours. in 100 mL 10.53mL/hr Q12H   Yes [provider]  feeding supplement (OSMOLITE 1 CAL) LIQD Take 1,000 mLs by mouth daily. 52mL/hr   Yes [provider]  fentaNYL citrate (PF) 10 mcg/mL in sodium chloride 0.9 % 200 mL Inject 50 mcg/hr into the vein See admin instructions. 2,578mcg in Rate: 67mL/hr Freq: Q12H   Yes [provider]  guaiFENesin-Codeine 200-20 MG/10ML SOLN Take 10 mLs by mouth every 6 (six) hours as needed (Cough).   Yes [provider]  ipratropium-albuterol (DUONEB) 0.5-2.5 (3) MG/3ML SOLN Take 3 mLs by nebulization every 6 (six) hours.   Yes [provider]  levofloxacin (LEVAQUIN) 750 MG/150ML SOLN Inject 750 mg into the vein daily. Rate: 137ml/hr Dur: 90 minutes Freq: Q24H Started 05/31/23 End 06/03/23   Yes [provider]  LORazepam (ATIVAN) 0.5 MG tablet Take 0.5 mg by mouth in the morning.   Yes [provider]  LORazepam (ATIVAN) 1 MG tablet Take 1 mg by mouth at bedtime.   Yes [provider]  LORazepam (ATIVAN) 2 MG/ML injection Inject 0.5 mg into the vein every 6 (six) hours as needed for anxiety.   Yes  [provider]  methylPREDNISolone acetate (DEPO-MEDROL) 40 MG/ML injection 20 mg every 12 (twelve) hours. Started 05/29/23 End 06/12/23   Yes [provider]  METOCLOPRAMIDE HCL IJ Inject 10 mg into the vein every 6 (six) hours.   Yes [provider]  Multiple Vitamins-Minerals (MULTIVITAL PO) Take 5 mLs by mouth in the morning and at bedtime.   Yes [provider]  OLANZapine (ZYPREXA) 2.5 MG tablet Take 1 tablet (2.5 mg total) by mouth at bedtime. 01/20/23  Yes Cottle, Steva Ready., MD  ondansetron [facility]) 4 MG/2ML SOLN injection Inject 4 mg into the vein every 6 (six) hours as needed for nausea or vomiting.   Yes [provider]  pantoprazole 40 mg in sodium chloride 0.9 % 100 mL Inject 40 mg into the vein every 12 (twelve) hours.   Yes [provider]  polyethylene glycol (MIRALAX / GLYCOLAX) 17 g packet Take 17 g by mouth 2 (two) times daily as needed for moderate constipation. Give with lunch and evening meal if no BM during the day as needed   Yes [provider]  promethazine (PHENERGAN) 25 MG/ML injection Inject 12.5 mg into the muscle every 6 (six) hours as needed for nausea or vomiting.   Yes [provider]  propofol (DIPRIVAN) 1000 MG/100ML EMUL injection Inject 2.939 mLs into the vein See admin instructions. Rate: 2.93mL/hr Dur: 34hr 2 min Freq: Every 6 hours   Yes [provider]  propranolol (INDERAL) 20 MG tablet TAKE 1 TO 2 TABLETS BY MOUTH  TWICE DAILY AS NEEDED 01/21/23  Yes Cottle, Steva Ready., MD  sennosides-docusate sodium (SENOKOT-S) 8.6-50 MG tablet Take 1 tablet by mouth 2 (two) times daily as needed for constipation.   Yes [provider]  zolpidem (AMBIEN) 5 MG  tablet Take 5 mg by mouth at bedtime as needed for sleep.   Yes [provider]  doxycycline (VIBRAMYCIN) 100 MG capsule Take 100 mg by mouth 2 (two) times daily. Patient not taking: Reported on 06/05/2023 05/14/23   [provider]  gabapentin (NEURONTIN) 100 MG capsule Take 300 mg by mouth at bedtime. Patient not taking: Reported on 06/05/2023 12/11/15   [provider]  loratadine (CLARITIN) 10 MG tablet Take 10 mg by mouth daily as needed for allergies. Patient not taking: Reported on 06/05/2023 05/11/23   [provider]  meloxicam (MOBIC) 7.5 MG tablet Take 7.5 mg by mouth 2 (two) times daily. Patient not taking: Reported on 06/05/2023 04/04/23   [provider]  methocarbamol (ROBAXIN) 500 MG tablet Take 500 mg by mouth 2 (two) times daily as needed for muscle spasms. Patient not taking: Reported on 06/05/2023 04/04/23   [provider]  predniSONE (DELTASONE) 20 MG tablet Take 10-40 mg by mouth See admin instructions. AKE 2 TABLETS (40mg ) BY MOUTH ONCE DAILY UNTIL SYMPTOMS IMPROVE, THEN DECREASE TO 1/2 TABLET (10mg ) EVERY OTHER DAY Patient not taking: Reported on 06/05/2023 05/14/23   [provider]  promethazine-dextromethorphan (PROMETHAZINE-DM) 6.25-15 MG/5ML syrup Take 5 mLs by mouth every 6 (six) hours as needed for cough. Patient not taking: Reported on 06/05/2023 05/11/23   [provider]  sertraline (ZOLOFT) 100 MG tablet TAKE 2 TABLETS BY MOUTH IN THE  MORNING AND 1 TABLET BY MOUTH IN THE EVENING 03/17/23   Cottle, Steva Ready., MD     Critical care time: 35 mins     Modena Slater, DO Internal Medicine Resident PGY-2 Pager: [phone]

## 2023-06-08 NOTE — Progress Notes (Signed)
    [facility] has been requested to perform a transesophageal echocardiogram on Tacey Heap for evaluation of severe mitral regurgitation. Patient is currently sedated/on ventilator. Consent discussion via phone with patient's spouse Jayveon Convey.  After careful review of history and examination, the risks and benefits of transesophageal echocardiogram have been explained including risks of esophageal damage, perforation (1:10,000 risk), bleeding, pharyngeal hematoma as well as other potential complications associated with conscious sedation including aspiration, arrhythmia, respiratory failure and death. Alternatives to treatment were discussed, questions were answered. Patient's spouse willing to consent on patient's behalf/ is willing to proceed.   Perlie Gold PA-C 06/08/2023 3:55 PM

## 2023-06-08 NOTE — Evaluation (Addendum)
Physical Therapy Evaluation Patient Details Name: Joshua Eaton MRN: [id number] DOB: 1956-02-02 Today's Date: 06/08/2023  History of Present Illness  67 yo male admitted to [facility] 9/19 with SOB, intubated 9/20. Pt with continued ARDS and AKI, 10/3 transfer to [facility] 10/6 bronch and trach. PMHx: colon CA s/p Rt hemicolectomy, MVR, COPD  Clinical Impression  Pt with very limited response of toe wiggling, left thumb movement and partial eye opening/head movement x 2. Pt tolerated PROM all extremities and transition to chair position during session. Pt unable and family not present to state PLOF. Pt unable to significantly participate or move during today's session and will follow from a distance for progression of activity. Pt with decreased cognition, strength, function and balance who will benefit from acute therapy to maximize mobility and function to decrease burden of care as able to actively participate. PT would benefit from [facility].  PRVC, FiO2 40%, sPO2 98%, peep 5 HR 66 BP 119/46        If plan is discharge home, recommend the following: Two people to help with walking and/or transfers;Two people to help with bathing/dressing/bathroom;Assistance with cooking/housework;Assist for transportation   Can travel by Doctor, hospital (measurements PT);Wheelchair cushion (measurements PT);Hoyer lift;Hospital bed  Recommendations for Other Services       Functional Status Assessment Patient has had a recent decline in their functional status and/or demonstrates limited ability to make significant improvements in function in a reasonable and predictable amount of time     Precautions / Restrictions Precautions Precautions: Fall;Other (comment) Precaution Comments: vent, trach, UB edema Restrictions Weight Bearing Restrictions: No      Mobility  Bed Mobility               General bed mobility comments: total assist with bed  features to transition from supine with HOB elevated to full chair position. pt with left lean in sitting with pillows supporting pt    Transfers                   General transfer comment: unable    Ambulation/Gait                  Stairs            Wheelchair Mobility     Tilt Bed    Modified Rankin (Stroke Patients Only)       Balance                                             Pertinent Vitals/Pain Pain Assessment Pain Assessment: CPOT Facial Expression: Relaxed, neutral Body Movements: Absence of movements Muscle Tension: Relaxed Compliance with ventilator (intubated pts.): Tolerating ventilator or movement Vocalization (extubated pts.): N/A CPOT Total: 0 Pain Intervention(s): Repositioned    Home Living Family/patient expects to be discharged to:: Unsure                   Additional Comments: pt unable  to provide home setup or PLOF and no family present    Prior Function                       Extremity/Trunk Assessment   Upper Extremity Assessment Upper Extremity Assessment: RUE deficits/detail;LUE deficits/detail RUE Deficits / Details: significant edema with no AROM noted. PROM WFL LUE  Deficits / Details: significant edema with 1 instance of thumb wiggle but no other AROM noted. PROM WFL    Lower Extremity Assessment Lower Extremity Assessment: LLE deficits/detail;RLE deficits/detail RLE Deficits / Details: wiggled toes on command and with noxious stimuli, no other AROM, PROM WFL LLE Deficits / Details: wiggled toes on command and with noxious stimuli, no other AROM, PROM WFL       Communication   Communication Communication: Tracheostomy  Cognition Arousal: Obtunded   Overall Cognitive Status: Difficult to assess                                 General Comments: pt with trach on vent, maintains eyes closed with 2 periods of partial eye opening or slight turn of head to  stimuli        General Comments      Exercises General Exercises - Upper Extremity Shoulder Flexion: PROM, Both, Seated, 10 reps Shoulder Horizontal ABduction: PROM, Both, Seated, 10 reps Elbow Flexion: PROM, Both, Seated, 10 reps Elbow Extension: PROM, 10 reps, Both, Seated General Exercises - Lower Extremity Ankle Circles/Pumps: PROM, Both, 10 reps, Seated Short Arc Quad: PROM, Both, Seated, 10 reps Heel Slides: PROM, Both, 10 reps, Seated Hip ABduction/ADduction: PROM, Both, Seated, 10 reps   Assessment/Plan    PT Assessment Patient needs continued PT services  PT Problem List Decreased strength;Decreased mobility;Decreased safety awareness;Decreased range of motion;Decreased coordination;Decreased activity tolerance;Decreased cognition;Cardiopulmonary status limiting activity;Decreased skin integrity;Decreased balance;Decreased knowledge of use of DME       PT Treatment Interventions DME instruction;Functional mobility training;Cognitive remediation;Therapeutic activities;Patient/family education;Neuromuscular re-education;Balance training;Therapeutic exercise    PT Goals (Current goals can be found in the Care Plan section)  Acute Rehab PT Goals PT Goal Formulation: Patient unable to participate in goal setting Time For Goal Achievement: 06/22/23 Potential to Achieve Goals: Poor    Frequency Min 1X/week     Co-evaluation               AM-PAC PT "6 Clicks" Mobility  Outcome Measure Help needed turning from your back to your side while in a flat bed without using bedrails?: Total Help needed moving from lying on your back to sitting on the side of a flat bed without using bedrails?: Total Help needed moving to and from a bed to a chair (including a wheelchair)?: Total Help needed standing up from a chair using your arms (e.g., wheelchair or bedside chair)?: Total Help needed to walk in hospital room?: Total Help needed climbing 3-5 steps with a railing? :  Total 6 Click Score: 6    End of Session   Activity Tolerance: Patient limited by lethargy Patient left: in bed;with bed alarm set Nurse Communication: Mobility status;Need for lift equipment PT Visit Diagnosis: Other abnormalities of gait and mobility (R26.89);Muscle weakness (generalized) (M62.81);Other symptoms and signs involving the nervous system (R29.898)    Time: 6045-4098 PT Time Calculation (min) (ACUTE ONLY): 12 min   Charges:   PT Evaluation $PT Eval High Complexity: 1 High   PT General Charges $$ ACUTE PT VISIT: 1 Visit         Merryl Hacker, PT Acute Rehabilitation Services Office: [phone]   Shahzad Thomann B [place] 06/08/2023, 10:50 AM

## 2023-06-09 ENCOUNTER — Encounter: Payer: Self-pay | Admitting: Internal Medicine

## 2023-06-09 ENCOUNTER — Inpatient Hospital Stay (HOSPITAL_COMMUNITY): Payer: Medicare Other

## 2023-06-09 ENCOUNTER — Other Ambulatory Visit: Payer: Self-pay

## 2023-06-09 DIAGNOSIS — I341 Nonrheumatic mitral (valve) prolapse: Secondary | ICD-10-CM

## 2023-06-09 DIAGNOSIS — J9601 Acute respiratory failure with hypoxia: Secondary | ICD-10-CM | POA: Diagnosis not present

## 2023-06-09 LAB — HEPATIC FUNCTION PANEL
ALT: 18 U/L (ref 0–44)
AST: 30 U/L (ref 15–41)
Albumin: 1.8 g/dL — ABNORMAL LOW (ref 3.5–5.0)
Alkaline Phosphatase: 48 U/L (ref 38–126)
Bilirubin, Direct: 0.4 mg/dL — ABNORMAL HIGH (ref 0.0–0.2)
Indirect Bilirubin: 1.1 mg/dL — ABNORMAL HIGH (ref 0.3–0.9)
Total Bilirubin: 1.5 mg/dL — ABNORMAL HIGH (ref 0.3–1.2)
Total Protein: 5 g/dL — ABNORMAL LOW (ref 6.5–8.1)

## 2023-06-09 LAB — BASIC METABOLIC PANEL
Anion gap: 11 (ref 5–15)
BUN: 52 mg/dL — ABNORMAL HIGH (ref 8–23)
CO2: 28 mmol/L (ref 22–32)
Calcium: 7.9 mg/dL — ABNORMAL LOW (ref 8.9–10.3)
Chloride: 102 mmol/L (ref 98–111)
Creatinine, Ser: 0.92 mg/dL (ref 0.61–1.24)
GFR, Estimated: >60 mL/min (ref >60)
Glucose, Bld: 121 mg/dL — ABNORMAL HIGH (ref 70–99)
Potassium: 3.4 mmol/L — ABNORMAL LOW (ref 3.5–5.1)
Sodium: 141 mmol/L (ref 135–145)

## 2023-06-09 LAB — CBC
HCT: 33.8 % — ABNORMAL LOW (ref 39.0–52.0)
Hemoglobin: 10.5 g/dL — ABNORMAL LOW (ref 13.0–17.0)
MCH: 31.2 pg (ref 26.0–34.0)
MCHC: 31.1 g/dL (ref 30.0–36.0)
MCV: 100.3 fL — ABNORMAL HIGH (ref 80.0–100.0)
Platelets: 62 10*3/uL — ABNORMAL LOW (ref 150–400)
RBC: 3.37 MIL/uL — ABNORMAL LOW (ref 4.22–5.81)
RDW: 20.5 % — ABNORMAL HIGH (ref 11.5–15.5)
WBC: 10.5 10*3/uL (ref 4.0–10.5)
nRBC: 0 % (ref 0.0–0.2)

## 2023-06-09 LAB — GLUCOSE, CAPILLARY
Glucose-Capillary: 129 mg/dL — ABNORMAL HIGH (ref 70–99)
Glucose-Capillary: 110 mg/dL — ABNORMAL HIGH (ref 70–99)
Glucose-Capillary: 114 mg/dL — ABNORMAL HIGH (ref 70–99)
Glucose-Capillary: 108 mg/dL — ABNORMAL HIGH (ref 70–99)
Glucose-Capillary: 132 mg/dL — ABNORMAL HIGH (ref 70–99)
Glucose-Capillary: 163 mg/dL — ABNORMAL HIGH (ref 70–99)

## 2023-06-09 LAB — PHOSPHORUS: Phosphorus: 4.4 mg/dL (ref 2.5–4.6)

## 2023-06-09 LAB — ECHO TEE: P 1/2 time: 411 ms

## 2023-06-09 LAB — MAGNESIUM: Magnesium: 2.5 mg/dL — ABNORMAL HIGH (ref 1.7–2.4)

## 2023-06-09 MED ORDER — FUROSEMIDE 10 MG/ML IJ SOLN
80.0000 mg | Freq: Three times a day (TID) | INTRAMUSCULAR | Status: DC
  Administered 2023-06-09: 80 mg via INTRAVENOUS
  Filled 2023-06-09: qty 8

## 2023-06-09 MED ORDER — CLONAZEPAM 0.25 MG PO TBDP: 0.5000 mg | ORAL_TABLET | Freq: Two times a day (BID) | ORAL | Status: DC

## 2023-06-09 MED ORDER — ALBUMIN HUMAN 25 % IV SOLN
12.5000 g | Freq: Once | INTRAVENOUS | Status: AC
  Administered 2023-06-09: 12.5 g via INTRAVENOUS
  Filled 2023-06-09: qty 50

## 2023-06-09 MED ORDER — POTASSIUM CHLORIDE CRYS ER 20 MEQ PO TBCR: 40.0000 meq | EXTENDED_RELEASE_TABLET | Freq: Three times a day (TID) | ORAL | Status: DC

## 2023-06-09 MED ORDER — ACETAMINOPHEN 325 MG PO TABS: 650.0000 mg | ORAL_TABLET | Freq: Four times a day (QID) | ORAL | Status: DC | PRN

## 2023-06-09 MED ORDER — SODIUM CHLORIDE 0.9 % IV SOLN: 250.0000 mL | INTRAVENOUS | Status: DC

## 2023-06-09 MED ORDER — MIDODRINE HCL 5 MG PO TABS
5.0000 mg | ORAL_TABLET | Freq: Three times a day (TID) | ORAL | Status: DC
  Filled 2023-06-09: qty 1

## 2023-06-09 MED ORDER — POTASSIUM CHLORIDE 20 MEQ PO PACK
40.0000 meq | PACK | Freq: Three times a day (TID) | ORAL | Status: DC
  Administered 2023-06-09 – 2023-06-10 (×2): 40 meq
  Filled 2023-06-09 (×2): qty 2

## 2023-06-09 MED ORDER — FOLIC ACID 1 MG PO TABS
1.0000 mg | ORAL_TABLET | Freq: Every day | ORAL | Status: DC
  Administered 2023-06-10 – 2023-06-28 (×18): 1 mg
  Filled 2023-06-09 (×18): qty 1

## 2023-06-09 MED ORDER — FOLIC ACID 1 MG PO TABS: 1.0000 mg | ORAL_TABLET | Freq: Every day | ORAL | Status: DC

## 2023-06-09 MED ORDER — FUROSEMIDE 10 MG/ML IJ SOLN
120.0000 mg | Freq: Three times a day (TID) | INTRAVENOUS | Status: AC
  Administered 2023-06-09: 120 mg via INTRAVENOUS
  Filled 2023-06-09: qty 10

## 2023-06-09 MED ORDER — MIDAZOLAM HCL 2 MG/2ML IJ SOLN
INTRAMUSCULAR | Status: AC
  Administered 2023-06-09: 4 mg via INTRAVENOUS
  Filled 2023-06-09: qty 4

## 2023-06-09 MED ORDER — FENTANYL CITRATE PF 50 MCG/ML IJ SOSY
50.0000 ug | PREFILLED_SYRINGE | Freq: Once | INTRAMUSCULAR | Status: AC
  Administered 2023-06-09: 50 ug via INTRAVENOUS
  Filled 2023-06-09: qty 1

## 2023-06-09 MED ORDER — PANTOPRAZOLE SODIUM 40 MG PO TBEC: 40.0000 mg | DELAYED_RELEASE_TABLET | Freq: Every day | ORAL | Status: DC

## 2023-06-09 MED ORDER — MIDAZOLAM HCL 2 MG/2ML IJ SOLN
4.0000 mg | Freq: Once | INTRAMUSCULAR | Status: AC
  Administered 2023-06-09: 4 mg via INTRAVENOUS
  Filled 2023-06-09: qty 4

## 2023-06-09 MED ORDER — FAMOTIDINE 20 MG PO TABS
20.0000 mg | ORAL_TABLET | Freq: Two times a day (BID) | ORAL | Status: DC
  Administered 2023-06-09 – 2023-06-17 (×17): 20 mg
  Filled 2023-06-09 (×17): qty 1

## 2023-06-09 MED ORDER — POTASSIUM CHLORIDE 20 MEQ PO PACK: 40.0000 meq | PACK | Freq: Three times a day (TID) | ORAL | Status: DC

## 2023-06-09 MED ORDER — OXYCODONE HCL 5 MG PO TABS
5.0000 mg | ORAL_TABLET | Freq: Four times a day (QID) | ORAL | Status: DC
  Filled 2023-06-09: qty 1

## 2023-06-09 MED ORDER — CLONAZEPAM 0.5 MG PO TABS
0.5000 mg | ORAL_TABLET | Freq: Two times a day (BID) | ORAL | Status: DC
  Administered 2023-06-09 – 2023-06-12 (×7): 0.5 mg
  Filled 2023-06-09 (×8): qty 1

## 2023-06-09 MED ORDER — PHENYLEPHRINE HCL-NACL 20-0.9 MG/250ML-% IV SOLN
25.0000 ug/min | INTRAVENOUS | Status: DC
  Administered 2023-06-09: 25 ug/min via INTRAVENOUS
  Filled 2023-06-09 (×2): qty 250

## 2023-06-09 MED ORDER — MIDODRINE HCL 5 MG PO TABS
5.0000 mg | ORAL_TABLET | Freq: Three times a day (TID) | ORAL | Status: DC
  Administered 2023-06-09 – 2023-06-10 (×2): 5 mg
  Filled 2023-06-09 (×2): qty 1

## 2023-06-09 MED ORDER — OXYCODONE HCL 5 MG/5ML PO SOLN
5.0000 mg | Freq: Four times a day (QID) | ORAL | Status: DC
  Administered 2023-06-09 – 2023-06-10 (×3): 5 mg
  Filled 2023-06-09 (×3): qty 5

## 2023-06-09 MED ORDER — ADULT MULTIVITAMIN W/MINERALS CH: 1.0000 | ORAL_TABLET | Freq: Every day | ORAL | Status: DC

## 2023-06-09 MED ORDER — SODIUM CHLORIDE 0.9% FLUSH
10.0000 mL | Freq: Two times a day (BID) | INTRAVENOUS | Status: DC
  Administered 2023-06-09 – 2023-06-12 (×6): 10 mL
  Administered 2023-06-13: 30 mL
  Administered 2023-06-13 – 2023-06-14 (×2): 10 mL
  Administered 2023-06-14: 20 mL
  Administered 2023-06-15 (×2): 10 mL
  Administered 2023-06-16: 30 mL
  Administered 2023-06-16 – 2023-06-17 (×3): 10 mL
  Administered 2023-06-18: 30 mL
  Administered 2023-06-18 – 2023-06-21 (×6): 10 mL
  Administered 2023-06-21: 30 mL
  Administered 2023-06-22: 10 mL
  Administered 2023-06-22: 30 mL
  Administered 2023-06-23: 20 mL
  Administered 2023-06-23 – 2023-06-24 (×2): 10 mL
  Administered 2023-06-24 – 2023-06-25 (×2): 30 mL
  Administered 2023-06-25 – 2023-06-28 (×6): 10 mL

## 2023-06-09 MED ORDER — SODIUM CHLORIDE 0.9% FLUSH: 10.0000 mL | INTRAVENOUS | Status: DC | PRN

## 2023-06-09 MED ORDER — ACETAMINOPHEN 160 MG/5ML PO SOLN
650.0000 mg | Freq: Four times a day (QID) | ORAL | Status: DC | PRN
  Administered 2023-06-09 – 2023-06-11 (×5): 650 mg
  Filled 2023-06-09 (×5): qty 20.3

## 2023-06-09 MED ORDER — MIDAZOLAM HCL 2 MG/2ML IJ SOLN: 4.0000 mg | Freq: Once | INTRAMUSCULAR | Status: AC

## 2023-06-09 MED ORDER — POTASSIUM CHLORIDE 20 MEQ PO PACK
40.0000 meq | PACK | Freq: Once | ORAL | Status: DC
  Filled 2023-06-09: qty 2

## 2023-06-09 MED ORDER — POTASSIUM CHLORIDE 20 MEQ PO PACK
40.0000 meq | PACK | Freq: Once | ORAL | Status: AC
  Administered 2023-06-09: 40 meq
  Filled 2023-06-09: qty 2

## 2023-06-09 MED ORDER — ADULT MULTIVITAMIN W/MINERALS CH
1.0000 | ORAL_TABLET | Freq: Every day | ORAL | Status: DC
  Administered 2023-06-10 – 2023-06-28 (×18): 1
  Filled 2023-06-09 (×18): qty 1

## 2023-06-09 MED ORDER — POLYETHYLENE GLYCOL 3350 17 G PO PACK: 17.0000 g | PACK | Freq: Every day | ORAL | Status: DC | PRN

## 2023-06-09 NOTE — Progress Notes (Signed)
NAME:  Joshua Eaton, MRN:  [id number], DOB:  12-Feb-1956, LOS: 6 ADMISSION DATE:  06/03/2023, CONSULTATION DATE:  06/02/2023 REFERRING MD:  Dr. Nadine Counts [facility] , CHIEF COMPLAINT: Pneumonia  History of Present Illness:  This is a 67 year old male with a past medical history of colon cancer status post right hemicolectomy 2007, COPD, mitral valve Regurgitation who presented to [facility] health on 05/20/2023 for shortness of breath. Prior to presenting, patient was feeling unwell for about 6 days with cough, shortness of breath. He had an initial CXR showing bilateral airspace opacities and was eventually placed on BiPAP and admitted to ICU. On 9/20 he was intubated due to ongoing respiratory failure and antibiotics were widened for multifocal pneumonia. He had echo performed showing EF 60-65%, severe dilated LA, severe MR. Throughout his course at [facility] had ongoing ARDS picture, developed worsening AKI. Eventually, [facility] PCCM team was consulted for transfer here due to ongoing high vent requirements, unable to wean. Additionally, there was concern over need for MV replacement and possible need for right heart cath.   Pertinent  Medical History  Colon cancer, status post hemicolectomy (right) 2007, questionable COPD wears CPAP at home, mitral valve regurgitation   Significant Hospital Events: Including procedures, antibiotic start and stop dates in addition to other pertinent events   Admitted to [facility] on 05/20/2023 06/03/2023: Transferred from [place] [facility] to [facility] 10/5 failed SBT 10/06: Tracheostomy   Interim History / Subjective:  Overnight: Patient had fever.  Patient evaluated at bedside this morning.  Patient still intubated.  Sleepy on Dex.  Objective   Blood pressure (!) 119/42, pulse (!) 48, temperature 99 F (37.2 C), resp. rate 16, height 6\' 1"  (1.854 m), weight 93.4 kg, SpO2 100%.  Vent Mode: PRVC FiO2 (%):  [40 %-100 %] 60 % Set Rate:  [16 bmp] 16 bmp Vt Set:   [640 mL] 640 mL PEEP:  [5 cmH20] 5 cmH20 Plateau Pressure:  [17 cmH20-24 cmH20] 18 cmH20   Intake/Output Summary (Last 24 hours) at 06/09/2023 1048 Last data filed at 06/09/2023 1000 Gross per 24 hour  Intake 2638.57 ml  Output 3760 ml  Net -1121.43 ml   Filed Weights   06/07/23 0256 06/08/23 0213 06/09/23 0300  Weight: 94.4 kg 94.4 kg 93.4 kg    Examination: General: Critically ill, tracheostomy in place. HENT: Tracheostomy in place Lungs: Decreased breath sounds bilaterally, rales, no wheezing appreciated Cardiovascular: Bradycardic, regular rhythm, grade 3/6 systolic murmur heard throughout Abdomen: Soft, nontender, nondistended Extremities: Diffuse edema to bilateral upper and lower extremities Neuro: follow some instructions, able to withdraw to pain GU: Foley in place draining well   Resolved Hospital Problem list   AKI GI bleed  Hypophosphatemia  Assessment & Plan:  This is a 67 year old male with past medical history of mitral valve regurgitation, colon cancer status post hemicolectomy in 2007 who presents for concerns of this of breath.  Patient admitted for further evaluation and management acute hypoxemic respiratory failure  #Acute hypoxemic respiratory failure status post tracheostomy still requiring mechanical ventilation #Pulmonary edema secondary to HFpEF exacerbation Patient has been improving.  At 1 point was able to wean off to a trach collar.  Is on ventilator at this time.  Will try to wean today.  Try to wake patient up today to see how he does on trach collar -Lasix 80 mg every 8 hours today x two doses  -Strict I's and O's -Daily weights - Try to wean to a Trach collar  #Fevers  Patient has had mild fevers overnight and yesterday.  Have gone up to 100.8 F.  Patient is becoming more hypotensive.  Did obtain new goal blood cultures.  Could be related to new medicine of Klonopin.  Could also be developing underlying infection as we did stop  antibiotics. -Repeat chest x-ray does not show any new infiltrates -Follow-up blood cultures -Will remove Foley  #Severe mitral regurgitation #Mild to moderate aortic valve insufficiency Plan for TEE today -Cardiology following -Follow-up TEE results  #History of hypertension Blood pressure have been softer.  Holding hydralazine and losartan today.  -Hold hydralazine 25 mg every 8 hours -Hold losartan 25 mg daily  #Thrombocytopenia Thrombocytopenia likely in setting of critical illness.  Platelets at 62 today.  No signs of bleeding. -Monitor CBC  #Nutrition -Continue tube feeds -Replete electrolytes as needed  #BPH Will attempt to take out Foley today to see if patient can void on his own. -Remove Foley  #Hyperbilirubinemia Bilirubin 1.5 this morning.  This is likely secondary to congestive hepatopathy -Continue diuresing   Best Practice (right click and "Reselect all SmartList Selections" daily)   Diet/type: tubefeeds DVT prophylaxis: Heparin GI prophylaxis: PPI Lines: Midline  Foley:  Yes, and it is still needed Code Status:  full code  Labs   CBC: Recent Labs  Lab 06/05/23 0209 06/06/23 0228 06/07/23 0305 06/08/23 0354 06/09/23 0829  WBC 8.7 8.7 9.9 7.3 10.5  HGB 9.5* 9.4* 10.1* 9.5* 10.5*  HCT 30.2* 29.2* 31.9* 30.5* 33.8*  MCV 98.7 99.7 97.9 99.3 100.3*  PLT 52* 50* 60* 53* 62*    Basic Metabolic Panel: Recent Labs  Lab 06/04/23 0902 06/04/23 1743 06/05/23 0209 06/06/23 0228 06/06/23 1436 06/07/23 0305 06/08/23 0354 06/09/23 0829  NA 145  --  146* 144 144 143 143 141  K 4.1  --  3.0* 3.2* 4.0 3.4* 4.0 3.4*  CL 112*  --  110 107 108 103 108 102  CO2 24  --  27 28 27 28 27 28   GLUCOSE 116*  --  167* 108* 120* 149* 157* 121*  BUN 43*  --  46* 40* 42* 38* 43* 52*  CREATININE 0.98  --  0.95 0.78 0.77 0.75 0.99 0.92  CALCIUM 8.8*  --  8.3* 7.9* 8.2* 8.2* 7.6* 7.9*  MG 2.1 2.0 2.0  --  2.8* 2.7* 2.4 2.5*  PHOS 2.3* 2.5 2.8  --   --  2.1*  --   4.4   GFR: Estimated Creatinine Clearance: 85.6 mL/min (by C-G formula based on SCr of 0.92 mg/dL). Recent Labs  Lab 06/03/23 2112 06/04/23 0902 06/06/23 0228 06/07/23 0305 06/08/23 0354 06/09/23 0829  PROCALCITON 0.27  --   --   --   --   --   WBC 10.2   < > 8.7 9.9 7.3 10.5   < > = values in this interval not displayed.    Liver Function Tests: Recent Labs  Lab 06/03/23 2112 06/05/23 0209 06/07/23 0305 06/09/23 0829  AST 30 28 28 30   ALT 14 14 15 18   ALKPHOS 43 50 52 48  BILITOT 2.1* 1.6* 1.6* 1.5*  PROT 4.5* 4.3* 4.8* 5.0*  ALBUMIN 1.9* 1.7* 1.8* 1.8*   No results for input(s): "LIPASE", "AMYLASE" in the last 168 hours. No results for input(s): "AMMONIA" in the last 168 hours.  ABG    Component Value Date/Time   HCO3 28.6 (H) 06/03/2023 2229   O2SAT 87.8 06/03/2023 2229     Coagulation Profile: No results  for input(s): "INR", "PROTIME" in the last 168 hours.  Cardiac Enzymes: No results for input(s): "CKTOTAL", "CKMB", "CKMBINDEX", "TROPONINI" in the last 168 hours.  HbA1C: Hgb A1c MFr Bld  Date/Time Value Ref Range Status  12/09/2015 06:30 PM 5.1 4.8 - 5.6 % Final    Comment:    (NOTE)         Pre-diabetes: 5.7 - 6.4         Diabetes: >6.4         Glycemic control for adults with diabetes: <7.0     CBG: Recent Labs  Lab 06/08/23 1528 06/08/23 1914 06/08/23 2309 06/09/23 0326 06/09/23 0756  GLUCAP 133* 153* 136* 129* 110*    Review of Systems:   Negative Except for what is stated in HPI   Past Medical History:  He,  has a past medical history of Anxiety, Cancer (HCC), Depression, Hypertension, and OCD (obsessive compulsive disorder).   Surgical History:   Past Surgical History:  Procedure Laterality Date   CHOLECYSTECTOMY     COLON SURGERY     cancerous polyp removed   HERNIA REPAIR       Social History:   reports that he has never smoked. He has never used smokeless tobacco. He reports that he does not drink alcohol and does  not use drugs.   Family History:  His family history is not on file.   Allergies Allergies  Allergen Reactions   Codeine     "sends me into orbit"     Home Medications  Prior to Admission medications   Medication Sig Start Date End Date Taking? Authorizing Provider  acetaminophen (TYLENOL) 325 MG tablet Take 650 mg by mouth every 6 (six) hours as needed for moderate pain.   Yes [provider]  aspirin EC 81 MG tablet Take 81 mg by mouth daily. Swallow whole.   Yes [provider]  benzonatate (TESSALON) 200 MG capsule Take 200 mg by mouth 3 (three) times daily as needed for cough. 05/11/23  Yes [provider]  budesonide (PULMICORT) 0.5 MG/2ML nebulizer solution Take 0.5 mg by nebulization 2 (two) times daily.   Yes [provider]  chlorhexidine (PERIDEX) 0.12 % solution Use as directed 15 mLs in the mouth or throat 2 (two) times daily.   Yes [provider]  dexmedeTOMIDine HCl (PRECEDEX IV) Inject 1 Dose into the vein every 12 (twelve) hours. in 100 mL 10.60mL/hr Q12H   Yes [provider]  feeding supplement (OSMOLITE 1 CAL) LIQD Take 1,000 mLs by mouth daily. 69mL/hr   Yes [provider]  fentaNYL citrate (PF) 10 mcg/mL in sodium chloride 0.9 % 200 mL Inject 50 mcg/hr into the vein See admin instructions. 2,517mcg in Rate: 43mL/hr Freq: Q12H   Yes [provider]  guaiFENesin-Codeine 200-20 MG/10ML SOLN Take 10 mLs by mouth every 6 (six) hours as needed (Cough).   Yes [provider]  ipratropium-albuterol (DUONEB) 0.5-2.5 (3) MG/3ML SOLN Take 3 mLs by nebulization every 6 (six) hours.   Yes [provider]  levofloxacin (LEVAQUIN) 750 MG/150ML SOLN Inject 750 mg into the vein daily. Rate: 19ml/hr Dur: 90 minutes Freq: Q24H Started 05/31/23 End 06/03/23   Yes [provider]  LORazepam (ATIVAN) 0.5 MG tablet Take 0.5 mg by mouth in the morning.   Yes [provider]  LORazepam (ATIVAN) 1 MG tablet Take 1 mg by mouth at bedtime.   Yes [provider]  LORazepam (ATIVAN) 2 MG/ML  injection Inject 0.5 mg into the vein every 6 (six) hours as needed for anxiety.   Yes [provider]  methylPREDNISolone acetate (DEPO-MEDROL) 40 MG/ML injection 20 mg every 12 (twelve) hours. Started 05/29/23 End 06/12/23   Yes [provider]  METOCLOPRAMIDE HCL IJ Inject 10 mg into the vein every 6 (six) hours.   Yes [provider]  Multiple Vitamins-Minerals (MULTIVITAL PO) Take 5 mLs by mouth in the morning and at bedtime.   Yes [provider]  OLANZapine (ZYPREXA) 2.5 MG tablet Take 1 tablet (2.5 mg total) by mouth at bedtime. 01/20/23  Yes Cottle, Steva Ready., MD  ondansetron [facility]) 4 MG/2ML SOLN injection Inject 4 mg into the vein every 6 (six) hours as needed for nausea or vomiting.   Yes [provider]  pantoprazole 40 mg in sodium chloride 0.9 % 100 mL Inject 40 mg into the vein every 12 (twelve) hours.   Yes [provider]  polyethylene glycol (MIRALAX / GLYCOLAX) 17 g packet Take 17 g by mouth 2 (two) times daily as needed for moderate constipation. Give with lunch and evening meal if no BM during the day as needed   Yes [provider]  promethazine (PHENERGAN) 25 MG/ML injection Inject 12.5 mg into the muscle every 6 (six) hours as needed for nausea or vomiting.   Yes [provider]  propofol (DIPRIVAN) 1000 MG/100ML EMUL injection Inject 2.939 mLs into the vein See admin instructions. Rate: 2.971mL/hr Dur: 34hr 2 min Freq: Every 6 hours   Yes [provider]  propranolol (INDERAL) 20 MG tablet TAKE 1 TO 2 TABLETS BY MOUTH  TWICE DAILY AS NEEDED 01/21/23  Yes Cottle, Steva Ready., MD  sennosides-docusate sodium (SENOKOT-S) 8.6-50 MG tablet Take 1 tablet by mouth 2 (two) times daily as needed for constipation.   Yes [provider]  zolpidem (AMBIEN) 5 MG  tablet Take 5 mg by mouth at bedtime as needed for sleep.   Yes [provider]  doxycycline (VIBRAMYCIN) 100 MG capsule Take 100 mg by mouth 2 (two) times daily. Patient not taking: Reported on 06/05/2023 05/14/23   [provider]  gabapentin (NEURONTIN) 100 MG capsule Take 300 mg by mouth at bedtime. Patient not taking: Reported on 06/05/2023 12/11/15   [provider]  loratadine (CLARITIN) 10 MG tablet Take 10 mg by mouth daily as needed for allergies. Patient not taking: Reported on 06/05/2023 05/11/23   [provider]  meloxicam (MOBIC) 7.5 MG tablet Take 7.5 mg by mouth 2 (two) times daily. Patient not taking: Reported on 06/05/2023 04/04/23   [provider]  methocarbamol (ROBAXIN) 500 MG tablet Take 500 mg by mouth 2 (two) times daily as needed for muscle spasms. Patient not taking: Reported on 06/05/2023 04/04/23   [provider]  predniSONE (DELTASONE) 20 MG tablet Take 10-40 mg by mouth See admin instructions. AKE 2 TABLETS (40mg ) BY MOUTH ONCE DAILY UNTIL SYMPTOMS IMPROVE, THEN DECREASE TO 1/2 TABLET (10mg ) EVERY OTHER DAY Patient not taking: Reported on 06/05/2023 05/14/23   [provider]  promethazine-dextromethorphan (PROMETHAZINE-DM) 6.25-15 MG/5ML syrup Take 5 mLs by mouth every 6 (six) hours as needed for cough. Patient not taking: Reported on 06/05/2023 05/11/23   [provider]  sertraline (ZOLOFT) 100 MG tablet TAKE 2 TABLETS BY MOUTH IN THE  MORNING AND 1 TABLET BY MOUTH IN THE EVENING 03/17/23   Cottle, Steva Ready., MD     Critical care time: 82  mins     Modena Slater, DO Internal Medicine Resident PGY-2 Pager: [phone]

## 2023-06-09 NOTE — CV Procedure (Addendum)
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  During this procedure the patient is administered a total of Versed 8 mg and Fentanyl 100 mcg to achieve and maintain moderate conscious sedation.  The patient's heart rate, blood pressure, and oxygen saturation are monitored continuously during the procedure. The period of conscious sedation is 25 minutes, of which I was present face-to-face 100% of this time.  Lekha Dancer C. Duke Salvia, MD, [facility] 06/09/2023 12:36 PM

## 2023-06-09 NOTE — Progress Notes (Signed)
Peripherally Inserted Central Catheter Placement  The IV Nurse has discussed with the patient and/or persons authorized to consent for the patient, the purpose of this procedure and the potential benefits and risks involved with this procedure.  The benefits include less needle sticks, lab draws from the catheter, and the patient may be discharged home with the catheter. Risks include, but not limited to, infection, bleeding, blood clot (thrombus formation), and puncture of an artery; nerve damage and irregular heartbeat and possibility to perform a PICC exchange if needed/ordered by physician.  Alternatives to this procedure were also discussed.  Bard Power PICC patient education guide, fact sheet on infection prevention and patient information card has been provided to patient /or left at bedside.  Consent obtained via phone with wife   PICC Placement Documentation  PICC Triple Lumen 06/09/23 Right Brachial 40 cm 0 cm (Active)  Indication for Insertion or Continuance of Line Vasoactive infusions 06/09/23 1600  Exposed Catheter (cm) 0 cm 06/09/23 1600  Site Assessment Clean, Dry, Intact 06/09/23 1600  Lumen #1 Status Flushed;Saline locked;Blood return noted 06/09/23 1600  Lumen #2 Status Flushed;Saline locked;Blood return noted 06/09/23 1600  Lumen #3 Status Flushed;Saline locked;Blood return noted 06/09/23 1600  Dressing Type Transparent;Securing device 06/09/23 1600  Dressing Status Antimicrobial disc in place;Clean, Dry, Intact 06/09/23 1600  Line Care Connections checked and tightened 06/09/23 1600  Line Adjustment (NICU/IV Team Only) No 06/09/23 1600  Dressing Intervention New dressing 06/09/23 1600  Dressing Change Due 06/16/23 06/09/23 1600       Franne Grip Renee 06/09/2023, 4:24 PM

## 2023-06-09 NOTE — Progress Notes (Signed)
Afternoon rounds  Patient appears uncomfortable. Still on low dose neo. Missed am meds. Currently off low dose precedex. Will schedule oxycodone for pain. Schedule midodrine to help come off neosynephrine. Goal MAP>55. Spoke with cardiology - no minimally invasive options for MV repair due to leaflet perforation. Too sick for CTCS surgery. Difficult clinical situation. May need to engage goals of care. Will try to medically optimize for LTAC but if he goes there he has high likelihood for bouncing back due to ongoing severe MR. Discussed ongoing diuresis but removing foley and scanning for retention. Likely the foley he has is the same one that has been in since previous hospital stay and needs to be changed anyway.   Additional cc time 36 minutes  Durel Salts, MD Pulmonary and Critical Care Medicine [facility] 06/09/2023 4:36 PM Pager: see AMION  If no response to pager, please call critical care on call (see AMION) until 7pm After 7:00 pm call Elink

## 2023-06-09 NOTE — Consult Note (Addendum)
Cardiology Consultation   Patient ID: Joshua Eaton MRN: [id number]; DOB: Feb 15, 1956  Admit date: 06/03/2023 Date of Consult: 06/09/2023  PCP:  Joshua Mask, MD   [facility] Providers Cardiologist:  New (Dr. Izora Eaton)  Patient Profile:   Joshua Eaton is a 67 y.o. male with a history of severe mitral regurgitation noted on Echo in 05/2023 hypertension, obstructive sleep apnea on CPAP, colon cancer s/p right hemicolectomy in 2007, severe anxiety, and OCD who is being seen 06/09/2023 for the evaluation of severe mitral regurgitation and moderate aortic insufficiency at the request of Dr. Celine Eaton.  History of Present Illness:   Joshua Eaton is a 67 year old male with the above history.  Per chart review, patient presented to [facility] on 05/20/2023 for further evaluation of shortness of breath x1 week.  Chest x-ray showed vascular congestion with bibasilar lateral airspace opacities. Chest CTA was negative for PE but did show diffuse, patchy consolidative opacities throughout both lung with scattered area of sparing suspicious for multifocal pneumonia as well as small bilateral pleural effusions.  Initial labs remarkable for WBC 27.1, Pro BNP 7,580 procalcitonin 0.22, lactic acid 2.1, Na 136, Ca 10.3, Albumin 3.4, AST 81, ALT 90, Total Bili 1.9. Respiratory panle negative for COVID, influenza, and RSV.  He was placed on BiPAP and started on IV antibiotics and then admitted to ICU for sepsis acute hypoxic respiratory failure secondary to multifocal pneumonia. Despite markedly elevated BNP on arrival, he looked hypovolemic so was not diuresed. Echo showed LVEF of 60-65%, severe left atrial enlargement, and severe mitral regurgitation.He ultimately required intubation on 9/20 and had persistent respiratory failure and FiO2 was unable to be weaned down. Hospitalization was also complicated by AKI, thrombocytopenia, and possible GI bleed with reports of melena.  He was ultimately  transferred to [facility] on 06/03/2023 for further evaluation and management.  Repeat Echo on 10/4 with LVEF of 60-65% with moderate prolapse of the anterior mitral leaflet and severe mitral regurgitation as well as mild to moderate aortic insufficiency.  He had a tracheostomy placed on 06/06/2023 (he was transfused 1 unit of platelets prior to this). His ongoing respiratory failure was ultimately felt to be more due to acute pulmonary edema in the setting of his severe mitral regurgitation rather than active pneumonia. He has been diuresed with IV Lasix and is currently negative negative 6.1 L since being here at [facility]. He underwent TEE today which confirmed severe prolapse of the anterior mitral valve leaflet with likely associated leaflet perforation and severe mitral regurgitation as well as moderate aortic insufficiency. Cardiology consulted for further evaluation.  At the of the evaluation, patient is sedated on Precedex with tracheostomy. He is diffusely edematous. No family is currently at bedside so I called and spoke with his wife Joshua Eaton and sister Joshua Eaton (who works in the renal transplant unit at [employer]). Per their report, patient was in his usual states of health until about 1 week prior to presenting to [facility] when he started to have shortness of breath and a cough. Family reports he was having dyspnea on exertion. His sister states she she did noticed his O2 sats would drop to the 80s with ambulation. He was seen by his PCP and lungs were reportedly clear. He was treated with UTI with antihistamines and cough medicine but had no improvement. PCP subsequently prescribed antibiotics and steroids but this also did not help. He was not having any chest pain, orthopnea, PND, or edema prior to admission. He was  having a lot of issues with back pain but was still very active. His wife states he would be outside working on his car for 6 hours straight. It sounds like he was recently told he had a  "leaky" heart valve about 1 month ago but he had not had an Echo prior to admission.  Past Medical History:  Diagnosis Date   Anxiety    Cancer (HCC)    Depression    Hypertension    OCD (obsessive compulsive disorder)     Past Surgical History:  Procedure Laterality Date   CHOLECYSTECTOMY     COLON SURGERY     cancerous polyp removed   HERNIA REPAIR         Inpatient Medications: Scheduled Meds:  sodium chloride   Intravenous Once   Chlorhexidine Gluconate Cloth  6 each Topical Daily   clonazepam  0.5 mg Per Tube BID   feeding supplement (PROSource TF20)  60 mL Per Tube Daily   fiber supplement (BANATROL TF)  60 mL Per Tube BID   folic acid  1 mg Per Tube Daily   free water  200 mL Per Tube Q6H   furosemide  80 mg Intravenous Q8H   heparin injection (subcutaneous)  5,000 Units Subcutaneous Q8H   insulin aspart  2 Units Subcutaneous Q4H   insulin aspart  2-6 Units Subcutaneous Q4H   multivitamin  15 mL Per Tube Daily   mouth rinse  15 mL Mouth Rinse Q2H   pantoprazole (PROTONIX) IV  40 mg Intravenous Q24H   potassium chloride  40 mEq Per Tube Once   potassium chloride  40 mEq Per Tube Once   sodium chloride flush  10-40 mL Intracatheter Q12H   Continuous Infusions:  dexmedetomidine (PRECEDEX) IV infusion 0.2 mcg/kg/hr (06/09/23 1000)   feeding supplement (VITAL 1.5 CAL) Stopped (06/09/23 0908)   phenylephrine (NEO-SYNEPHRINE) Adult infusion 15 mcg/min (06/09/23 1000)   PRN Meds: acetaminophen, fentaNYL (SUBLIMAZE) injection, ipratropium-albuterol, loperamide HCl, mouth rinse, polyethylene glycol, sodium chloride flush  Allergies:    Allergies  Allergen Reactions   Codeine     "sends me into orbit"    Social History:   Social History   Socioeconomic History   Marital status: Married    Spouse name: Not on file   Number of children: Not on file   Years of education: Not on file   Highest education level: Not on file  Occupational History   Not on file   Tobacco Use   Smoking status: Never   Smokeless tobacco: Never  Substance and Sexual Activity   Alcohol use: No   Drug use: No   Sexual activity: Not on file  Other Topics Concern   Not on file  Social History Narrative   Not on file   Social Determinants of Health   Financial Resource Strain: Not on file  Food Insecurity: Not on file  Transportation Needs: Not on file  Physical Activity: Not on file  Stress: Not on file  Social Connections: Not on file  Intimate Partner Violence: Not on file    Family History:   No family history on file.   ROS:  Please see the history of present illness.  Review of Systems  Unable to perform ROS: Intubated   Physical Exam/Data:   Vitals:   06/09/23 0700 06/09/23 0800 06/09/23 0900 06/09/23 1000  BP: (!) 146/48 (!) 131/43 (!) 111/47 (!) 119/42  Pulse: (!) 48 (!) 47 (!) 57 (!) 48  Resp: 16  16 17 16   Temp: (!) 93.9 F (34.4 C)  99 F (37.2 C) 99 F (37.2 C)  TempSrc:      SpO2: 100% 100% 100% 100%  Weight:      Height:        Intake/Output Summary (Last 24 hours) at 06/09/2023 1355 Last data filed at 06/09/2023 1000 Gross per 24 hour  Intake 2638.57 ml  Output 3760 ml  Net -1121.43 ml      06/09/2023    3:00 AM 06/08/2023    2:13 AM 06/07/2023    2:56 AM  Last 3 Weights  Weight (lbs) 205 lb 14.6 oz 208 lb 1.8 oz 208 lb 1.8 oz  Weight (kg) 93.4 kg 94.4 kg 94.4 kg     Body mass index is 27.17 kg/m.  General: 67 y.o. ill appearing Caucasian male. Sedated and intubated (vent to trach). HEENT: Normocephalic and atraumatic. Sclera clear.  Neck: Supple. JVD difficult to assess due to trach color. Heart: Mildly bradycardic with normal rhythm. Holosystolic murmur best heard at apex.  Lungs: Intubated (vent to trach). Coarse breath sounds throughout.  Extremities: Diffuse edema of bilateral upper and lower extremities.  Skin: Warm and dry. Neuro: Sedated and intubated. Psych: Sedated and intubated.   EKG:  The EKG was  personally reviewed and demonstrates:  EKG on 10/4 showed normal sinus rhythm, rate 81 bpm, with LVH, and no acute ST/T changes.  Repeat EKG on 10/7 showed normal sinus rhythm, rate 84 beats minute, with LVH and multiple PACs but no acute ST/T changes.  Telemetry:  Telemetry was personally reviewed and demonstrates:  Sinus rhythm with rates in the high 40s to 60s.  Relevant CV Studies:  TTE 06/04/2023: Impressions: 1. There is moderate prolaspe of the anterior mitral leaflet, unable to  determine if flail, recommend TEE. The mitral valve is abnormal. Severe  mitral valve regurgitation. No evidence of mitral stenosis.   2. Left ventricular ejection fraction, by estimation, is 60 to 65%. The  left ventricle has normal function. The left ventricle has no regional  wall motion abnormalities. There is moderate asymmetric left ventricular  hypertrophy of the inferior segment.  Left ventricular diastolic function could not be evaluated.   3. Right ventricular systolic function is normal. The right ventricular  size is normal.   4. Left atrial size was severely dilated.   5. The aortic valve is normal in structure. Aortic valve regurgitation is  mild to moderate. Aortic valve sclerosis is present, with no evidence of  aortic valve stenosis. Aortic regurgitation PHT measures 362 msec.   6. The inferior vena cava is normal in size with greater than 50%  respiratory variability, suggesting right atrial pressure of 3 mmHg.     Laboratory Data:  High Sensitivity Troponin:  No results for input(s): "TROPONINIHS" in the last 720 hours.   Chemistry Recent Labs  Lab 06/07/23 0305 06/08/23 0354 06/09/23 0829  NA 143 143 141  K 3.4* 4.0 3.4*  CL 103 108 102  CO2 28 27 28   GLUCOSE 149* 157* 121*  BUN 38* 43* 52*  CREATININE 0.75 0.99 0.92  CALCIUM 8.2* 7.6* 7.9*  MG 2.7* 2.4 2.5*  GFRNONAA >60 >60 >60  ANIONGAP 12 8 11     Recent Labs  Lab 06/05/23 0209 06/07/23 0305 06/09/23 0829   PROT 4.3* 4.8* 5.0*  ALBUMIN 1.7* 1.8* 1.8*  AST 28 28 30   ALT 14 15 18   ALKPHOS 50 52 48  BILITOT 1.6* 1.6* 1.5*  Lipids  Recent Labs  Lab 06/07/23 0305  TRIG 124    Hematology Recent Labs  Lab 06/07/23 0305 06/08/23 0354 06/09/23 0829  WBC 9.9 7.3 10.5  RBC 3.26* 3.07* 3.37*  HGB 10.1* 9.5* 10.5*  HCT 31.9* 30.5* 33.8*  MCV 97.9 99.3 100.3*  MCH 31.0 30.9 31.2  MCHC 31.7 31.1 31.1  RDW 20.9* 20.8* 20.5*  PLT 60* 53* 62*   Thyroid No results for input(s): "TSH", "FREET4" in the last 168 hours.  BNP Recent Labs  Lab 06/03/23 2112  BNP 564.4*    DDimer No results for input(s): "DDIMER" in the last 168 hours.   Radiology/Studies:  DG CHEST PORT 1 VIEW  Result Date: 06/09/2023 CLINICAL DATA:  Fever. EXAM: PORTABLE CHEST 1 VIEW COMPARISON:  June 06, 2023. FINDINGS: Stable cardiomediastinal silhouette. Tracheostomy and feeding tubes are unchanged. Stable bilateral lung opacities are noted concerning for edema or pneumonia. Small pleural effusions may be present. Bony thorax is unremarkable. IMPRESSION: Stable support apparatus.  Stable bilateral lung opacities. Electronically Signed   By: Lupita Raider M.D.   On: 06/09/2023 12:39   [place] EKG SITE RITE  Result Date: 06/09/2023 If Site Rite image not attached, placement could not be confirmed due to current cardiac rhythm.  DG Chest Port 1 View  Result Date: 06/06/2023 CLINICAL DATA:  Tracheostomy EXAM: PORTABLE CHEST 1 VIEW COMPARISON:  One-view chest x-ray 06/04/2023 FINDINGS: A lordotic view is submitted.  Lung apices are incompletely imaged. Tracheostomy tube terminates 3 cm above the carina. A small bore feeding tube courses off the inferior border the film. Heart is enlarged. Interstitial and airspace opacities are present in the lower lobes bilaterally. Bilateral pleural effusions are again noted, right greater than left. IMPRESSION: 1. Tracheostomy tube terminates 3 cm above the carina. 2. Small bore feeding  tube courses off the inferior border the film. 3. Stable cardiomegaly. 4. Stable interstitial and airspace opacities representing edema or infection. Electronically Signed   By: Marin Roberts M.D.   On: 06/06/2023 12:39     Assessment and Plan:   Severe Mitral Valve Prolapse with Severe Mitral Regurgitation Moderate Aortic Insufficiency Patient was admitted for acute hypoxic respiratory failure as described below and found to have severe mitral regurgitation on initial TTE at [facility]. Repeat TTE here at [facility] showed normal LV function and moderate prolapse of the anterior mitral leaflet and severe mitral regurgitation as well as mild to moderate aortic insufficiency.  TEE on 10/9 showed severe prolapse of the anterior mitral valve leaflet with likely associated leaflet perforation and severe mitral regurgitation as well as moderate aortic insufficiency. Do not think he is a candidate for valve repair. Not a candidate for Mitraclip given leaflet perforation and would not be a standard MVR candidate given other comorbidities. However, will review with MD. Recommend consulting palliative care if this has not been done.  Acute Hypoxic Respiratory s/p Tracheostomy Patient has had a prolonged hospitalization. He was initially admitted to [facility] on 05/20/2023 for acute hypoxic respiratory failure ultimately requiring intubation and initially felt to be due to pneumonia/ ARDS. He was transferred to [facility] on 10/3 and had tracheostomy placed on 10/6. He continued to have persistent respiratory failure and this has been felt to be due to acute pulmonary edema given severe mitral regurgitation. On 10/8, he was noted to have a mild fever up to 100.8 and was more hypotensive concerning for possible new infection. Repeat chest x-ray showed no new infiltrates. Blood cultures negative so  far. - Continue aggressive diuresis as below. - Otherwise, management per PCCM.  Acute Diastolic CHF TTE  showed LVEF of 60-65% with normal wall motion, moderate asymmetric LVH, severe left atrial enlargement, and severe valvular disease as described above. He has been aggressively diuresed with IV Lasix. Net negative 6.1 L since being here at [facility].  - Grossly volume overloaded. - Continue aggressive IV Lasix. Currently on 80mg  twice daily.  Hypotension History of Hypertension Patient has a history of hypertension but is now hypotensive requiring pressors.   - Currently on Neo-Synephrine 55mcg/min. - Continue to hold Hydralazine and Losartan.  Otherwise, per PCCM: - Fever - Anemia - Thrombocytopenia - Hyperbilirubinemia - BPH - Severe anxiety - OCD  Risk Assessment/Risk Scores:    New York Heart Association (NYHA) Functional Class NYHA Class II-III   For questions or updates, please contact [facility] Please consult [email or web address] for contact info under    Signed, Corrin Parker, PA-C  06/09/2023 1:55 PM   Personally seen and examined. Agree with APP above with the following comments:  Joshua Eaton, a 67 year old with a history of hypertension, obstructive sleep apnea, and colon cancer status post right colectomy, presents after a prolonged hospitalization due to multifocal pneumonia and septic shock. 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 Prior to hospitalization, he experienced shortness of breath and was found to have a severe elevation in his white blood cell count- he had multi-focal pneumonia. His hospital course was complicated by acute kidney injury, thrombocytopenia, and a gastrointestinal bleed with reported melena. He has received transfusion for thrombocytopenia.  He required intubation and subsequently underwent a tracheostomy due to difficulty weaning from the ventilator (transferred to [facility] 06/03/23).   Today a TEE was performed: he  was  found to have severe mitral regurgitation related to an anterior A1 perforation, likely exacerbated by moderate aortic regurgitation.  Patient is s/p Tracheostomy.  There are times he appears to track movement.  Unable to communicate. Exam notable for GENERAL: Acutely ill appearing, sedated, on mechanical ventilation with coarse breath sounds bilaterally. CARDIOVASCULAR: Systolic and diastolic murmurs. INTEGUMENT: multiple skin lesions that are well dressed RESPIRATORY: Coarse breath sounds bilaterally, at time of assessment he is on FIO2 of 50%    Personally reviewed relevant tests;  WBC: severe elevation Albumin: 3.4 ->  1.8 (06/09/2023) Respiratory Viral panel: negative Potassium: 3.4 (06/09/2023) Bilirubin: 1.5 (06/09/2023) Indirect bilirubin: 1.1 (06/09/2023) Hb: 10.5 (06/09/2023) PLT: 62 (06/09/2023)  DIAGNOSTIC Transesophageal echocardiogram: severe mitral regurgitation related to an anterior A1 perforation, moderate aortic regurgitation, mild pulmonic insufficiency EKG: sinus rhythm with left ventricular hypertrophy, occasional premature ventricular and premature atrial contractions (06/09/2023) Telemetry: sinus bradycardia, rare isolated PVCs (06/09/2023)  Would recommend   Severe Mitral Regurgitation due to Anterior A1 Perforation - Exacerbated by moderate aortic regurgitation and Not a candidate for percutaneous intervention or surgery due to critical illness, colon cancer, thrombocytopenia, tracheostomy, and complex hospitalization. -Given the above, it is my professional opinion that he would be turned down by Cardiothoracic surgery -Continue medical management, though limited.  Multifocal Pneumonia and Sepsis - Complicated by acute kidney injury, thrombocytopenia, and GI bleed. Currently stable with improvement in platelet count. - continue current management  Hypokalemia Potassium at 3.4, likely due to high dose Lasix. -Increase IV Lasix dose and increase  potassium supplementation.  Hypotension Requiring pressers to maintain blood pressure. -Continue  pressers as needed.  Tracheostomy Difficulty weaning from intubation led to tracheostomy. - increased lasix as above  Family Discussion Poor prognosis discussed with family due to critical illness and limited treatment options. - discussed at length with wife, Joshua Eaton, and her son.  I discussed with them talking with Almus's sister as well.  They asked that we try to meet in person.  I will try to meet him in person between 9A and 10 A on 06/10/23.  I recommend palliative care consultation.  Discussed with PCCM team.  CRITICAL CARE Performed by: Zillah Alexie A Euel Castile  Total critical care time: 67 minutes. Critical care time was exclusive of separately billable procedures and treating other patients. Critical care was necessary to treat or prevent imminent or life-threatening deterioration. Critical care was time spent personally by me on the following activities: development of treatment plan with patient and/or surrogate as well as nursing, discussions with consultants, evaluation of patient's response to treatment, examination of patient, obtaining history from patient or surrogate, ordering and performing treatments and interventions, ordering and review of laboratory studies, ordering and review of radiographic studies, pulse oximetry and re-evaluation of patient's condition.    Signed, Degroat Lam, MD FASE [facility]  [facility]  06/09/2023 5:26 PM

## 2023-06-09 NOTE — Progress Notes (Signed)
Nutrition Follow-up  DOCUMENTATION CODES:  Not applicable  INTERVENTION:  Once cortrak in place, resume TF at goal: Vital 1.5 at 60 ml/h (1440 ml per day) Prosource TF20 60 ml 1x/d Free water q6h Provides 2240 kcal, 117 gm protein, 1100 ml free water daily ( TF+flush) Banatrol BID to aid in management of loose stools  NUTRITION DIAGNOSIS:  Inadequate oral intake related to acute illness as evidenced by NPO status. - remains applicable  GOAL:  Patient will meet greater than or equal to 90% of their needs - progressing, being met with TF at goal  MONITOR:  Vent status, Labs, Weight trends, TF tolerance  REASON FOR ASSESSMENT:  Consult Enteral/tube feeding initiation and management  ASSESSMENT:  Pt admitted with PNA and respiratory failure. PMH significant for colon cancer s/p R hemicolectomy (2007), COPD, MVR.  9/20: intubated at [facility] 10/3: transferred to [facility] d/t high vent requirements, unable to wean, and concern for need for MVR and R heart cath 10/6 - bedside trach placed, bronchoscopy 10/9 - TEE, small bore tube pulled during procedure, cortrak to be placed today  Patient is currently intubated on ventilator support via trach. Undergoing TEE at the time of assessment. Small bore tube removed and cortrak is to be placed today. Discussed with RN, can resume feeds at goal once tube is in place. Lots of loose stool, banatrol BID added   Noted that pt is being considered for LTACH once stable to discharge  MV: 11.9 L/min Temp (24hrs), Avg:99.6 F (37.6 C), Min:93.9 F (34.4 C), Max:100.9 F (38.3 C)  Admit weight: 101.9 kg Current weight: 93.4 kg   Intake/Output Summary (Last 24 hours) at 06/09/2023 1315 Last data filed at 06/09/2023 1000 Gross per 24 hour  Intake 2638.57 ml  Output 3760 ml  Net -1121.43 ml  Net IO Since Admission: -6,165.2 mL [06/09/23 1315]  Nutritionally Relevant Medications: Scheduled Meds:  PROSource TF20  60 mL Per  Tube Daily   BANATROL TF  60 mL Per Tube BID   folic acid  1 mg Per Tube Daily   free water  200 mL Per Tube Q6H   furosemide  80 mg Intravenous Q8H   insulin aspart  2 Units Subcutaneous Q4H   insulin aspart  2-6 Units Subcutaneous Q4H   multivitamin  15 mL Per Tube Daily   pantoprazole IV  40 mg Intravenous Q24H   potassium chloride  40 mEq Per Tube Once   potassium chloride  40 mEq Per Tube Once   Continuous Infusions:  dexmedetomidine (PRECEDEX) IV infusion 0.2 mcg/kg/hr (06/09/23 1000)   feeding supplement (VITAL 1.5 CAL) Stopped (06/09/23 0908)   phenylephrine (NEO-SYNEPHRINE) Adult infusion 15 mcg/min (06/09/23 1000)   PRN Meds: loperamide HCl, polyethylene glycol  Labs Reviewed: K 3.4 BUN 52 Mg 2.5 CBG ranges from 110-160 mg/dL over the last 24 hours  NUTRITION - FOCUSED PHYSICAL EXAM: Flowsheet Row Most Recent Value  Orbital Region Unable to assess  Upper Arm Region Mild depletion  Thoracic and Lumbar Region No depletion  Buccal Region Unable to assess  Temple Region Severe depletion  Clavicle Bone Region Severe depletion  Clavicle and Acromion Bone Region Moderate depletion  Scapular Bone Region Unable to assess  Dorsal Hand Unable to assess  [mod edema]  Patellar Region Unable to assess  [RLE edema]  Anterior Thigh Region Severe depletion  Posterior Calf Region Severe depletion  Edema (RD Assessment) Moderate  [moderate BLE]  Hair Reviewed  Eyes Reviewed  Mouth Unable to  assess  Skin Reviewed  Nails Reviewed   Diet Order:   Diet Order             Diet NPO time specified  Diet effective midnight                   EDUCATION NEEDS:  No education needs have been identified at this time  Skin:  Skin Assessment: Reviewed RN Assessment  Last BM:  10/9 - type 7 Fecal Management System in place, with output in the last 24 hours.  Height:  Ht Readings from Last 1 Encounters:  06/03/23 6\' 1"  (1.854 m)    Weight:  Wt Readings from Last 1  Encounters:  06/09/23 93.4 kg    Ideal Body Weight:  83.6 kg  BMI:  Body mass index is 27.17 kg/m.  Estimated Nutritional Needs:  Kcal:  2100-2300 Protein:  105-120g Fluid:  >/=2L    Greig Castilla, RD, LDN Clinical Dietitian RD pager # available in AMION  After hours/weekend pager # available in [facility]

## 2023-06-09 NOTE — Progress Notes (Signed)
  Echocardiogram Echocardiogram Transesophageal has been performed.  Delcie Roch 06/09/2023, 1:30 PM

## 2023-06-09 NOTE — Progress Notes (Signed)
Pt not tolerating SBT at this time due to apnea, low min ventilation and bradycardia. Pt placed back on full vent support, pt tolerating well at this time. CCM aware, RN aware, RT will monitor as needed.

## 2023-06-09 NOTE — Procedures (Signed)
Cortrak  Person Inserting Tube:  Mahala Menghini, RD Tube Type:  Cortrak - 43 inches Tube Size:  10 Tube Location:  Left nare Secured by: Bridle Technique Used to Measure Tube Placement:  Marking at nare/corner of mouth Cortrak Secured At:  64 cm   Cortrak Tube Team Note:  Consult received to place a Cortrak feeding tube.   X-ray is required, abdominal x-ray has been ordered by the Cortrak team. Please confirm tube placement before using the Cortrak tube.   If the tube becomes dislodged please keep the tube and contact the Cortrak team at [email or web address] for replacement.  If after hours and replacement cannot be delayed, place a NG tube and confirm placement with an abdominal x-ray.    Mertie Clause, MS, RD, LDN Registered Dietitian II Please see AMiON for contact information.

## 2023-06-10 ENCOUNTER — Inpatient Hospital Stay (HOSPITAL_COMMUNITY): Payer: Medicare Other

## 2023-06-10 DIAGNOSIS — I351 Nonrheumatic aortic (valve) insufficiency: Secondary | ICD-10-CM

## 2023-06-10 DIAGNOSIS — J9601 Acute respiratory failure with hypoxia: Secondary | ICD-10-CM

## 2023-06-10 DIAGNOSIS — Z930 Tracheostomy status: Secondary | ICD-10-CM

## 2023-06-10 DIAGNOSIS — Z9911 Dependence on respirator [ventilator] status: Secondary | ICD-10-CM | POA: Diagnosis not present

## 2023-06-10 DIAGNOSIS — I340 Nonrheumatic mitral (valve) insufficiency: Secondary | ICD-10-CM | POA: Diagnosis not present

## 2023-06-10 LAB — CBC
HCT: 32.2 % — ABNORMAL LOW (ref 39.0–52.0)
Hemoglobin: 9.9 g/dL — ABNORMAL LOW (ref 13.0–17.0)
MCH: 31 pg (ref 26.0–34.0)
MCHC: 30.7 g/dL (ref 30.0–36.0)
MCV: 100.9 fL — ABNORMAL HIGH (ref 80.0–100.0)
Platelets: 64 10*3/uL — ABNORMAL LOW (ref 150–400)
RBC: 3.19 MIL/uL — ABNORMAL LOW (ref 4.22–5.81)
RDW: 20.3 % — ABNORMAL HIGH (ref 11.5–15.5)
WBC: 10.3 10*3/uL (ref 4.0–10.5)
nRBC: 0 % (ref 0.0–0.2)

## 2023-06-10 LAB — GLUCOSE, CAPILLARY
Glucose-Capillary: 147 mg/dL — ABNORMAL HIGH (ref 70–99)
Glucose-Capillary: 149 mg/dL — ABNORMAL HIGH (ref 70–99)
Glucose-Capillary: 149 mg/dL — ABNORMAL HIGH (ref 70–99)
Glucose-Capillary: 143 mg/dL — ABNORMAL HIGH (ref 70–99)
Glucose-Capillary: 142 mg/dL — ABNORMAL HIGH (ref 70–99)
Glucose-Capillary: 180 mg/dL — ABNORMAL HIGH (ref 70–99)

## 2023-06-10 LAB — BASIC METABOLIC PANEL
Anion gap: 10 (ref 5–15)
BUN: 55 mg/dL — ABNORMAL HIGH (ref 8–23)
CO2: 31 mmol/L (ref 22–32)
Calcium: 8.1 mg/dL — ABNORMAL LOW (ref 8.9–10.3)
Chloride: 106 mmol/L (ref 98–111)
Creatinine, Ser: 1.17 mg/dL (ref 0.61–1.24)
GFR, Estimated: >60 mL/min (ref >60)
Glucose, Bld: 137 mg/dL — ABNORMAL HIGH (ref 70–99)
Potassium: 3.3 mmol/L — ABNORMAL LOW (ref 3.5–5.1)
Sodium: 147 mmol/L — ABNORMAL HIGH (ref 135–145)

## 2023-06-10 MED ORDER — POTASSIUM CHLORIDE 20 MEQ PO PACK
80.0000 meq | PACK | Freq: Two times a day (BID) | ORAL | Status: DC
  Filled 2023-06-10: qty 4

## 2023-06-10 MED ORDER — FENTANYL CITRATE PF 50 MCG/ML IJ SOSY
25.0000 ug | PREFILLED_SYRINGE | INTRAMUSCULAR | Status: DC | PRN
  Administered 2023-06-11: 100 ug via INTRAVENOUS
  Administered 2023-06-13: 50 ug via INTRAVENOUS
  Administered 2023-06-13: 100 ug via INTRAVENOUS
  Administered 2023-06-15 – 2023-06-17 (×2): 50 ug via INTRAVENOUS
  Administered 2023-06-17 – 2023-06-22 (×19): 100 ug via INTRAVENOUS
  Administered 2023-06-22: 50 ug via INTRAVENOUS
  Administered 2023-06-23 – 2023-06-25 (×7): 100 ug via INTRAVENOUS
  Administered 2023-06-25: 50 ug via INTRAVENOUS
  Administered 2023-06-25 – 2023-06-26 (×3): 100 ug via INTRAVENOUS
  Filled 2023-06-10 (×2): qty 2
  Filled 2023-06-10: qty 1
  Filled 2023-06-10 (×15): qty 2
  Filled 2023-06-10: qty 1
  Filled 2023-06-10 (×2): qty 2
  Filled 2023-06-10: qty 1
  Filled 2023-06-10 (×3): qty 2
  Filled 2023-06-10: qty 1
  Filled 2023-06-10 (×5): qty 2
  Filled 2023-06-10: qty 1
  Filled 2023-06-10 (×5): qty 2

## 2023-06-10 MED ORDER — PIPERACILLIN-TAZOBACTAM 3.375 G IVPB
3.3750 g | Freq: Three times a day (TID) | INTRAVENOUS | Status: AC
  Administered 2023-06-10 – 2023-06-17 (×21): 3.375 g via INTRAVENOUS
  Filled 2023-06-10 (×21): qty 50

## 2023-06-10 MED ORDER — FUROSEMIDE 10 MG/ML IJ SOLN
120.0000 mg | Freq: Three times a day (TID) | INTRAVENOUS | Status: DC
  Administered 2023-06-10 – 2023-06-11 (×3): 120 mg via INTRAVENOUS
  Filled 2023-06-10: qty 10
  Filled 2023-06-10: qty 12
  Filled 2023-06-10: qty 10
  Filled 2023-06-10: qty 12
  Filled 2023-06-10: qty 10

## 2023-06-10 MED ORDER — OXYCODONE HCL 5 MG PO TABS
5.0000 mg | ORAL_TABLET | Freq: Four times a day (QID) | ORAL | Status: DC | PRN
  Administered 2023-06-14: 5 mg via ORAL
  Filled 2023-06-10: qty 1

## 2023-06-10 MED ORDER — POTASSIUM CHLORIDE 20 MEQ PO PACK
80.0000 meq | PACK | Freq: Two times a day (BID) | ORAL | Status: DC
  Administered 2023-06-10 – 2023-06-12 (×4): 80 meq
  Filled 2023-06-10 (×4): qty 4

## 2023-06-10 MED ORDER — HYDRALAZINE HCL 25 MG PO TABS
25.0000 mg | ORAL_TABLET | Freq: Three times a day (TID) | ORAL | Status: DC
  Administered 2023-06-10 – 2023-06-14 (×12): 25 mg
  Filled 2023-06-10 (×12): qty 1

## 2023-06-10 MED ORDER — POTASSIUM CHLORIDE 20 MEQ PO PACK
40.0000 meq | PACK | Freq: Once | ORAL | Status: AC
  Administered 2023-06-10: 40 meq
  Filled 2023-06-10: qty 2

## 2023-06-10 MED ORDER — HYDRALAZINE HCL 25 MG PO TABS: 25.0000 mg | ORAL_TABLET | Freq: Three times a day (TID) | ORAL | Status: DC

## 2023-06-10 MED ORDER — OXYCODONE HCL 5 MG/5ML PO SOLN
10.0000 mg | Freq: Four times a day (QID) | ORAL | Status: DC
  Administered 2023-06-10 – 2023-06-13 (×11): 10 mg
  Filled 2023-06-10 (×12): qty 10

## 2023-06-10 MED ORDER — FREE WATER
200.0000 mL | Status: DC
  Administered 2023-06-10 – 2023-06-14 (×23): 200 mL

## 2023-06-10 NOTE — Progress Notes (Signed)
eLink Physician-Brief Progress Note Patient Name: Joshua Eaton DOB: [birth year] MRN: [id number]   Date of Service  06/10/2023  HPI/Events of Note  67 y.o. male with a history of severe mitral regurgitation noted on Echo in 05/2023 hypertension, obstructive sleep apnea on CPAP, colon cancer s/p right hemicolectomy in 2007, severe anxiety, and OCD who is being seen 06/09/2023 for the evaluation of severe mitral regurgitation and moderate aortic insufficiency   Off phenylephrine since 7 PM, blood pressure climbing to 170s.  Persistent midodrine.  Off sedation.  eICU Interventions  Add holding parameters to midodrine.  Discontinue vasopressor orders.     Intervention Category Intermediate Interventions: Hypertension - evaluation and management  Sarai January 06/10/2023, 5:57 AM

## 2023-06-10 NOTE — Progress Notes (Signed)
NAME:  Joshua Eaton, MRN:  [id number], DOB:  [birth year], LOS: 7 ADMISSION DATE:  06/03/2023, CONSULTATION DATE:  06/02/2023 REFERRING MD:  Dr. Nadine Counts [facility] , CHIEF COMPLAINT: Pneumonia  History of Present Illness:  This is a 67 year old male with a past medical history of colon cancer status post right hemicolectomy 2007, COPD, mitral valve Regurgitation who presented to [facility] health on 05/20/2023 for shortness of breath. Prior to presenting, patient was feeling unwell for about 6 days with cough, shortness of breath. He had an initial CXR showing bilateral airspace opacities and was eventually placed on BiPAP and admitted to ICU. On 9/20 he was intubated due to ongoing respiratory failure and antibiotics were widened for multifocal pneumonia. He had echo performed showing EF 60-65%, severe dilated LA, severe MR. Throughout his course at [facility] had ongoing ARDS picture, developed worsening AKI. Eventually, [facility] PCCM team was consulted for transfer here due to ongoing high vent requirements, unable to wean. Additionally, there was concern over need for MV replacement and possible need for right heart cath.   Pertinent  Medical History  Colon cancer, status post hemicolectomy (right) 2007, questionable COPD wears CPAP at home, mitral valve regurgitation   Significant Hospital Events: Including procedures, antibiotic start and stop dates in addition to other pertinent events   Admitted to [facility] health on 05/20/2023 06/03/2023: Transferred from [place] health to [facility] 10/5 failed SBT 10/06: Tracheostomy  06/09/2023: TEE performed at bedside  Interim History / Subjective:  Overnight: No acute concerns   Patient evaluated at bedside this morning.  Patient is more alert, but not following instructions this morning.  Objective   Blood pressure (!) 167/55, pulse (!) 117, temperature 98.8 F (37.1 C), temperature source Oral, resp. rate 20, height 6\' 1"  (1.854 m), weight 89.3 kg, SpO2  96%. Blood pressures with maps into the 70s-80, off phenylephrine, satting at 97% on ventilator settings Vent Mode: PRVC FiO2 (%):  [50 %-60 %] 50 % Set Rate:  [16 bmp] 16 bmp Vt Set:  [640 mL] 640 mL PEEP:  [5 cmH20] 5 cmH20 Plateau Pressure:  [18 cmH20-23 cmH20] 23 cmH20   Intake/Output Summary (Last 24 hours) at 06/10/2023 0718 Last data filed at 06/10/2023 0700 Gross per 24 hour  Intake 1218.83 ml  Output 2250 ml  Net -1031.17 ml   Filed Weights   06/08/23 0213 06/09/23 0300 06/10/23 0111  Weight: 94.4 kg 93.4 kg 89.3 kg    Examination: General: Tracheostomy in place, critically ill, eyes open HENT: Tracheostomy in place, secretions noted Lungs: Very coarse breath sounds bilaterally, no rales or wheezing appreciated Cardiovascular: Tachycardic, regular rhythm, grade 3/6 systolic murmur heard throughout Abdomen: Soft, nondistended Extremities: Improving edema in the bilateral upper and lower extremities Neuro: Following instructions GU: External catheter in place  Blood cultures less than 12 hours with no growth CBC: White count 10.3, hemoglobin 9.9, platelets 64 BMP: Sodium 147, potassium 3.3, BUN 55, creatinine 1.27 Glucose: 132-147  Resolved Hospital Problem list   AKI GI bleed  Hypophosphatemia  Assessment & Plan:  This is a 67 year old male with past medical history of mitral valve regurgitation, colon cancer status post hemicolectomy in 2007 who presents for concerns of this of breath.  Patient admitted for further evaluation and management acute hypoxemic respiratory failure  #Acute hypoxemic respiratory failure status post tracheostomy still requiring mechanical ventilation #Pulmonary edema secondary to HFpEF exacerbation #Severe mitral regurgitation #Aortic valve insufficiency Patient is still requiring ventilator.  Not weaning well.  Patient is not  alert enough today.  Did have conversation with cardiology yesterday, and they state he is not a candidate  for any valve replacements given his comorbidities and his current conditions.  They are recommending palliative care, as this will be life limiting for the patient.  Plan to have family meeting with cardiology today around 9 AM-10 AM.  Was admitted -1 L yesterday.  Will continue diuresing as patient has stable opacities noted on Xray. -Will continue to diurese patient today -Continue on ventilator -Strict I's and O's -Cardiology following, appreciate recommendation -Family meeting at 9-10 a.m. with cardiology today -Hypokalemic today, will replete  #Fevers Patient continues to have fevers.  Went to 100.5 F night.  Not increasing.  Did have repeat chest x-ray yesterday which did have stable bilateral lung opacities.  New blood cultures not growing any organisms as of yet -Continue to follow blood cultures -Foley has been removed -Will discuss with attending about starting empirically  #History of hypertension Blood pressures have been elevated today.  Patient was started on midodrine.  Holding parameters in place for midodrine.  Can resume home hydralazine 25 mg every 8 hours  -Resume hydralazine 25 mg every 8 hours -Hold losartan 25 mg daily  #Macrocytic anemia #Thrombocytopenia Thrombocytopenia today at 64 today.  No signs of bleeding.  Will continue to monitor.  Hemoglobin 9.9 today. -Monitor CBC  #Nutrition -Continue tube feeds  #BPH Took out Foley yesterday.  Will BladderScan to make sure patient is not retaining -Patient has had spontaneous urination on PureWick  #Hyperbilirubinemia -Continue to monitor   Best Practice (right click and "Reselect all SmartList Selections" daily)   Diet/type: tubefeeds DVT prophylaxis: Heparin GI prophylaxis: PPI Lines: Midline  Foley:  Yes, and it is still needed Code Status:  full code  Labs   CBC: Recent Labs  Lab 06/06/23 0228 06/07/23 0305 06/08/23 0354 06/09/23 0829 06/10/23 0434  WBC 8.7 9.9 7.3 10.5 10.3  HGB 9.4*  10.1* 9.5* 10.5* 9.9*  HCT 29.2* 31.9* 30.5* 33.8* 32.2*  MCV 99.7 97.9 99.3 100.3* 100.9*  PLT 50* 60* 53* 62* 64*    Basic Metabolic Panel: Recent Labs  Lab 06/04/23 0902 06/04/23 1743 06/05/23 0209 06/06/23 0228 06/06/23 1436 06/07/23 0305 06/08/23 0354 06/09/23 0829 06/10/23 0434  NA 145  --  146*   < > 144 143 143 141 147*  K 4.1  --  3.0*   < > 4.0 3.4* 4.0 3.4* 3.3*  CL 112*  --  110   < > 108 103 108 102 106  CO2 24  --  27   < > 27 28 27 28 31   GLUCOSE 116*  --  167*   < > 120* 149* 157* 121* 137*  BUN 43*  --  46*   < > 42* 38* 43* 52* 55*  CREATININE 0.98  --  0.95   < > 0.77 0.75 0.99 0.92 1.17  CALCIUM 8.8*  --  8.3*   < > 8.2* 8.2* 7.6* 7.9* 8.1*  MG 2.1 2.0 2.0  --  2.8* 2.7* 2.4 2.5*  --   PHOS 2.3* 2.5 2.8  --   --  2.1*  --  4.4  --    < > = values in this interval not displayed.   GFR: Estimated Creatinine Clearance: 67.3 mL/min (by C-G formula based on SCr of 1.17 mg/dL). Recent Labs  Lab 06/03/23 2112 06/04/23 0902 06/07/23 0305 06/08/23 0354 06/09/23 0829 06/10/23 0434  PROCALCITON 0.27  --   --   --   --   --  WBC 10.2   < > 9.9 7.3 10.5 10.3   < > = values in this interval not displayed.    Liver Function Tests: Recent Labs  Lab 06/03/23 2112 06/05/23 0209 06/07/23 0305 06/09/23 0829  AST 30 28 28 30   ALT 14 14 15 18   ALKPHOS 43 50 52 48  BILITOT 2.1* 1.6* 1.6* 1.5*  PROT 4.5* 4.3* 4.8* 5.0*  ALBUMIN 1.9* 1.7* 1.8* 1.8*   No results for input(s): "LIPASE", "AMYLASE" in the last 168 hours. No results for input(s): "AMMONIA" in the last 168 hours.  ABG    Component Value Date/Time   HCO3 28.6 (H) 06/03/2023 2229   O2SAT 87.8 06/03/2023 2229     Coagulation Profile: No results for input(s): "INR", "PROTIME" in the last 168 hours.  Cardiac Enzymes: No results for input(s): "CKTOTAL", "CKMB", "CKMBINDEX", "TROPONINI" in the last 168 hours.  HbA1C: Hgb A1c MFr Bld  Date/Time Value Ref Range Status  12/09/2015 06:30 PM  5.1 4.8 - 5.6 % Final    Comment:    (NOTE)         Pre-diabetes: 5.7 - 6.4         Diabetes: >6.4         Glycemic control for adults with diabetes: <7.0     CBG: Recent Labs  Lab 06/09/23 1113 06/09/23 1514 06/09/23 1912 06/09/23 2312 06/10/23 0319  GLUCAP 114* 108* 132* 163* 147*    Review of Systems:   Negative Except for what is stated in HPI   Past Medical History:  He,  has a past medical history of Anxiety, Cancer (HCC), Depression, Hypertension, and OCD (obsessive compulsive disorder).   Surgical History:   Past Surgical History:  Procedure Laterality Date   CHOLECYSTECTOMY     COLON SURGERY     cancerous polyp removed   HERNIA REPAIR       Social History:   reports that he has never smoked. He has never used smokeless tobacco. He reports that he does not drink alcohol and does not use drugs.   Family History:  His family history is not on file.   Allergies Allergies  Allergen Reactions   Codeine     "sends me into orbit"     Home Medications  Prior to Admission medications   Medication Sig Start Date End Date Taking? Authorizing Provider  acetaminophen (TYLENOL) 325 MG tablet Take 650 mg by mouth every 6 (six) hours as needed for moderate pain.   Yes [provider]  aspirin EC 81 MG tablet Take 81 mg by mouth daily. Swallow whole.   Yes [provider]  benzonatate (TESSALON) 200 MG capsule Take 200 mg by mouth 3 (three) times daily as needed for cough. 05/11/23  Yes [provider]  budesonide (PULMICORT) 0.5 MG/2ML nebulizer solution Take 0.5 mg by nebulization 2 (two) times daily.   Yes [provider]  chlorhexidine (PERIDEX) 0.12 % solution Use as directed 15 mLs in the mouth or throat 2 (two) times daily.   Yes [provider]  dexmedeTOMIDine HCl (PRECEDEX IV) Inject 1 Dose into the vein every 12 (twelve) hours. in 100 mL 10.29mL/hr Q12H   Yes [provider]  feeding supplement  (OSMOLITE 1 CAL) LIQD Take 1,000 mLs by mouth daily. 38mL/hr   Yes [provider]  fentaNYL citrate (PF) 10 mcg/mL in sodium chloride 0.9 % 200 mL Inject 50 mcg/hr into the vein See admin instructions. 2,532mcg in Rate: 30mL/hr  Freq: Q12H   Yes [provider]  guaiFENesin-Codeine 200-20 MG/10ML SOLN Take 10 mLs by mouth every 6 (six) hours as needed (Cough).   Yes [provider]  ipratropium-albuterol (DUONEB) 0.5-2.5 (3) MG/3ML SOLN Take 3 mLs by nebulization every 6 (six) hours.   Yes [provider]  levofloxacin (LEVAQUIN) 750 MG/150ML SOLN Inject 750 mg into the vein daily. Rate: 176ml/hr Dur: 90 minutes Freq: Q24H Started 05/31/23 End 06/03/23   Yes [provider]  LORazepam (ATIVAN) 0.5 MG tablet Take 0.5 mg by mouth in the morning.   Yes [provider]  LORazepam (ATIVAN) 1 MG tablet Take 1 mg by mouth at bedtime.   Yes [provider]  LORazepam (ATIVAN) 2 MG/ML injection Inject 0.5 mg into the vein every 6 (six) hours as needed for anxiety.   Yes [provider]  methylPREDNISolone acetate (DEPO-MEDROL) 40 MG/ML injection 20 mg every 12 (twelve) hours. Started 05/29/23 End 06/12/23   Yes [provider]  METOCLOPRAMIDE HCL IJ Inject 10 mg into the vein every 6 (six) hours.   Yes [provider]  Multiple Vitamins-Minerals (MULTIVITAL PO) Take 5 mLs by mouth in the morning and at bedtime.   Yes [provider]  OLANZapine (ZYPREXA) 2.5 MG tablet Take 1 tablet (2.5 mg total) by mouth at bedtime. 01/20/23  Yes Cottle, Steva Ready., MD  ondansetron [facility]) 4 MG/2ML SOLN injection Inject 4 mg into the vein every 6 (six) hours as needed for nausea or vomiting.   Yes [provider]  pantoprazole 40 mg in sodium chloride 0.9 % 100 mL Inject 40 mg into the vein every 12 (twelve) hours.   Yes [provider]  polyethylene glycol (MIRALAX / GLYCOLAX) 17 g packet Take 17 g  by mouth 2 (two) times daily as needed for moderate constipation. Give with lunch and evening meal if no BM during the day as needed   Yes [provider]  promethazine (PHENERGAN) 25 MG/ML injection Inject 12.5 mg into the muscle every 6 (six) hours as needed for nausea or vomiting.   Yes [provider]  propofol (DIPRIVAN) 1000 MG/100ML EMUL injection Inject 2.939 mLs into the vein See admin instructions. Rate: 2.928mL/hr Dur: 34hr 2 min Freq: Every 6 hours   Yes [provider]  propranolol (INDERAL) 20 MG tablet TAKE 1 TO 2 TABLETS BY MOUTH  TWICE DAILY AS NEEDED 01/21/23  Yes Cottle, Steva Ready., MD  sennosides-docusate sodium (SENOKOT-S) 8.6-50 MG tablet Take 1 tablet by mouth 2 (two) times daily as needed for constipation.   Yes [provider]  zolpidem (AMBIEN) 5 MG tablet Take 5 mg by mouth at bedtime as needed for sleep.   Yes [provider]  doxycycline (VIBRAMYCIN) 100 MG capsule Take 100 mg by mouth 2 (two) times daily. Patient not taking: Reported on 06/05/2023 05/14/23   [provider]  gabapentin (NEURONTIN) 100 MG capsule Take 300 mg by mouth at bedtime. Patient not taking: Reported on 06/05/2023 12/11/15   [provider]  loratadine (CLARITIN) 10 MG tablet Take 10 mg by mouth daily as needed for allergies. Patient not taking: Reported on 06/05/2023 05/11/23   [provider]  meloxicam (MOBIC) 7.5 MG tablet Take 7.5 mg by mouth 2 (two) times daily. Patient not taking: Reported on 06/05/2023 04/04/23   [provider]  methocarbamol (ROBAXIN) 500 MG tablet Take 500 mg by mouth 2 (two) times daily as needed for muscle spasms. Patient  not taking: Reported on 06/05/2023 04/04/23   [provider]  predniSONE (DELTASONE) 20 MG tablet Take 10-40 mg by mouth See admin instructions. AKE 2 TABLETS (40mg ) BY MOUTH ONCE DAILY UNTIL SYMPTOMS IMPROVE, THEN DECREASE TO 1/2 TABLET (10mg ) EVERY OTHER DAY Patient  not taking: Reported on 06/05/2023 05/14/23   [provider]  promethazine-dextromethorphan (PROMETHAZINE-DM) 6.25-15 MG/5ML syrup Take 5 mLs by mouth every 6 (six) hours as needed for cough. Patient not taking: Reported on 06/05/2023 05/11/23   [provider]  sertraline (ZOLOFT) 100 MG tablet TAKE 2 TABLETS BY MOUTH IN THE  MORNING AND 1 TABLET BY MOUTH IN THE EVENING 03/17/23   Cottle, Steva Ready., MD     Critical care time: 34 mins     Modena Slater, DO Internal Medicine Resident PGY-2 Pager: [phone]

## 2023-06-10 NOTE — Progress Notes (Signed)
Progress Note  Patient Name: Joshua Eaton Date of Encounter: 06/10/2023 Primary Cardiologist: None   Subjective   Overnight blood pressure has improved. S/p Trach and sedated Still on Fi02 of 50%.  Vital Signs    Vitals:   06/10/23 0630 06/10/23 0645 06/10/23 0700 06/10/23 0734  BP: (!) 149/52 (!) 150/58 (!) 167/55   Pulse: (!) 109 (!) 107 (!) 117   Resp: 20 20 20    Temp:    (!) 100.5 F (38.1 C)  TempSrc:    Axillary  SpO2: 98% 97% 96%   Weight:      Height:        Intake/Output Summary (Last 24 hours) at 06/10/2023 0755 Last data filed at 06/10/2023 0700 Gross per 24 hour  Intake 1218.83 ml  Output 2250 ml  Net -1031.17 ml   Filed Weights   06/08/23 0213 06/09/23 0300 06/10/23 0111  Weight: 94.4 kg 93.4 kg 89.3 kg    Physical Exam   GEN: Chronically ill Neck: JVD noted Cardiac: regular tachycardia; there systolic and diastolic murmurs noted Respiratory: Diffuse rhonchi MS: bilateral edema  Labs   Telemetry: SR to largely sinus tachycardia with PVCs   Chemistry Recent Labs  Lab 06/05/23 0209 06/06/23 0228 06/07/23 0305 06/08/23 0354 06/09/23 0829 06/10/23 0434  NA 146*   < > 143 143 141 147*  K 3.0*   < > 3.4* 4.0 3.4* 3.3*  CL 110   < > 103 108 102 106  CO2 27   < > 28 27 28 31   GLUCOSE 167*   < > 149* 157* 121* 137*  BUN 46*   < > 38* 43* 52* 55*  CREATININE 0.95   < > 0.75 0.99 0.92 1.17  CALCIUM 8.3*   < > 8.2* 7.6* 7.9* 8.1*  PROT 4.3*  --  4.8*  --  5.0*  --   ALBUMIN 1.7*  --  1.8*  --  1.8*  --   AST 28  --  28  --  30  --   ALT 14  --  15  --  18  --   ALKPHOS 50  --  52  --  48  --   BILITOT 1.6*  --  1.6*  --  1.5*  --   GFRNONAA >60   < > >60 >60 >60 >60  ANIONGAP 9   < > 12 8 11 10    < > = values in this interval not displayed.     Hematology Recent Labs  Lab 06/08/23 0354 06/09/23 0829 06/10/23 0434  WBC 7.3 10.5 10.3  RBC 3.07* 3.37* 3.19*  HGB 9.5* 10.5* 9.9*  HCT 30.5* 33.8* 32.2*  MCV 99.3 100.3* 100.9*   MCH 30.9 31.2 31.0  MCHC 31.1 31.1 30.7  RDW 20.8* 20.5* 20.3*  PLT 53* 62* 64*    Cardiac EnzymesNo results for input(s): "TROPONINI" in the last 168 hours. No results for input(s): "TROPIPOC" in the last 168 hours.   BNP Recent Labs  Lab 06/03/23 2112  BNP 564.4*     DDimer No results for input(s): "DDIMER" in the last 168 hours.   Cardiac Studies   Cardiac Studies & Procedures       ECHOCARDIOGRAM  ECHOCARDIOGRAM COMPLETE 06/04/2023  Narrative ECHOCARDIOGRAM REPORT    Patient Name:   Joshua Eaton Date of Exam: 06/04/2023 Medical Rec #:  [id number]  Height:       73.0 in Accession #:    [id number] Weight:  225.3 lb Date of Birth:  1955-12-05   BSA:          2.263 m Patient Age:    67 years   BP:           123/46 mmHg Patient Gender: M          HR:           69 bpm. Exam Location:  Inpatient  Procedure: 2D Echo, Cardiac Doppler and Color Doppler  Indications:    MV disorder I05.9  History:        Patient has no prior history of Echocardiogram examinations. Mitral Valve Disease and Aortic Valve Disease; Risk Factors:Non-Smoker.  Sonographer:    Dondra Prader RVT RCS Referring Phys: [id number] Clarene Critchley [facility]   Sonographer Comments: Suboptimal parasternal window. Image acquisition challenging due to patient body habitus and Image acquisition challenging due to respiratory motion. IMPRESSIONS   1. There is moderate prolaspe of the anterior mitral leaflet, unable to determine if flail, recommend TEE. The mitral valve is abnormal. Severe mitral valve regurgitation. No evidence of mitral stenosis. 2. Left ventricular ejection fraction, by estimation, is 60 to 65%. The left ventricle has normal function. The left ventricle has no regional wall motion abnormalities. There is moderate asymmetric left ventricular hypertrophy of the inferior segment. Left ventricular diastolic function could not be evaluated. 3. Right ventricular systolic function is normal. The right  ventricular size is normal. 4. Left atrial size was severely dilated. 5. The aortic valve is normal in structure. Aortic valve regurgitation is mild to moderate. Aortic valve sclerosis is present, with no evidence of aortic valve stenosis. Aortic regurgitation PHT measures 362 msec. 6. The inferior vena cava is normal in size with greater than 50% respiratory variability, suggesting right atrial pressure of 3 mmHg.  FINDINGS Left Ventricle: Left ventricular ejection fraction, by estimation, is 60 to 65%. The left ventricle has normal function. The left ventricle has no regional wall motion abnormalities. The left ventricular internal cavity size was normal in size. There is moderate asymmetric left ventricular hypertrophy of the inferior segment. Left ventricular diastolic function could not be evaluated due to mitral regurgitation (moderate or greater). Left ventricular diastolic function could not be evaluated.  Right Ventricle: The right ventricular size is normal. No increase in right ventricular wall thickness. Right ventricular systolic function is normal.  Left Atrium: Left atrial size was severely dilated.  Right Atrium: Right atrial size was normal in size.  Pericardium: Trivial pericardial effusion is present. The pericardial effusion is anterior to the right ventricle.  Mitral Valve: There is moderate prolaspe of the anterior mitral leaflet, unable to determine if flail, recommend TEE. The mitral valve is abnormal. Severe mitral valve regurgitation. No evidence of mitral valve stenosis. MV peak gradient, 99.6 mmHg. The mean mitral valve gradient is 81.0 mmHg.  Tricuspid Valve: The tricuspid valve is normal in structure. Tricuspid valve regurgitation is not demonstrated. No evidence of tricuspid stenosis.  Aortic Valve: The aortic valve is normal in structure. Aortic valve regurgitation is mild to moderate. Aortic regurgitation PHT measures 362 msec. Aortic valve sclerosis is present,  with no evidence of aortic valve stenosis. Aortic valve mean gradient measures 8.2 mmHg. Aortic valve peak gradient measures 15.5 mmHg. Aortic valve area, by VTI measures 3.52 cm.  Pulmonic Valve: The pulmonic valve was not well visualized.  Aorta: The aortic root is normal in size and structure.  Venous: The inferior vena cava is normal in size with greater than 50% respiratory  variability, suggesting right atrial pressure of 3 mmHg.  IAS/Shunts: No atrial level shunt detected by color flow Doppler.   LEFT VENTRICLE PLAX 2D LVIDd:         5.30 cm   Diastology LVIDs:         3.50 cm   LV e' medial:    9.46 cm/s LV PW:         1.40 cm   LV E/e' medial:  16.7 LV IVS:        1.00 cm   LV e' lateral:   14.70 cm/s LVOT diam:     2.10 cm   LV E/e' lateral: 10.7 LV SV:         130 LV SV Index:   57 LVOT Area:     3.46 cm   RIGHT VENTRICLE             IVC RV S prime:     13.80 cm/s  IVC diam: 2.00 cm TAPSE (M-mode): 2.9 cm  LEFT ATRIUM              Index        RIGHT ATRIUM           Index LA diam:        4.10 cm  1.81 cm/m   RA Area:     12.90 cm LA Vol (A2C):   140.0 ml 61.85 ml/m  RA Volume:   30.60 ml  13.52 ml/m LA Vol (A4C):   113.8 ml 50.28 ml/m LA Biplane Vol: 142.0 ml 62.74 ml/m AORTIC VALVE AV Area (Vmax):    3.34 cm AV Area (Vmean):   3.13 cm AV Area (VTI):     3.52 cm AV Vmax:           196.80 cm/s AV Vmean:          130.600 cm/s AV VTI:            0.368 m AV Peak Grad:      15.5 mmHg AV Mean Grad:      8.2 mmHg LVOT Vmax:         190.00 cm/s LVOT Vmean:        118.000 cm/s LVOT VTI:          0.374 m LVOT/AV VTI ratio: 1.02 AI PHT:            362 msec  AORTA Ao Root diam: 3.50 cm Ao Asc diam:  3.50 cm  MITRAL VALVE MV Area (PHT): 2.61 cm     SHUNTS MV Area VTI:   0.92 cm     Systemic VTI:  0.37 m MV Peak grad:  99.6 mmHg    Systemic Diam: 2.10 cm MV Mean grad:  81.0 mmHg MV Vmax:       4.99 m/s MV Vmean:      438.0 cm/s MV Decel Time: 291  msec MV E velocity: 158.00 cm/s MV A velocity: 124.00 cm/s MV E/A ratio:  1.27  Kardie Tobb DO Electronically signed by Thomasene Ripple DO Signature Date/Time: 06/04/2023/11:15:56 AM    Final   TEE  ECHO TEE 06/09/2023  Narrative TRANSESOPHOGEAL ECHO REPORT    Patient Name:   LONZO SAULTER Date of Exam: 06/09/2023 Medical Rec #:  [id number]  Height:       73.0 in Accession #:    [id number] Weight:       205.9 lb Date of Birth:  29-Sep-1955   BSA:  2.178 m Patient Age:    57 years   BP:           116/40 mmHg Patient Gender: M          HR:           52 bpm. Exam Location:  Inpatient  Procedure: Transesophageal Echo  Indications:     mitral regurgitation  History:         Patient has prior history of Echocardiogram examinations, most recent 06/04/2023. COPD.  Sonographer:     Delcie Roch RDCS Referring Phys:  [id number] Perlie Gold Diagnosing Phys: Chilton Si MD   Sonographer Comments: Echo performed with patient supine and on artificial respirator.   PROCEDURE: After discussion of the risks and benefits of a TEE, an informed consent was obtained from a family member. The patient was intubated. The transesophogeal probe was passed without difficulty through the esophogus of the patient. Imaged were obtained with the patient in a left lateral decubitus position. Sedation performed by performing physician. The patient's vital signs; including heart rate, blood pressure, and oxygen saturation; remained stable throughout the procedure. The patient developed no complications during the procedure.  IMPRESSIONS   1. Images were technically challenging due to patient movement and movement/impedence from the NG tube. 2. Left ventricular ejection fraction, by estimation, is 60 to 65%. The left ventricle has normal function. The left ventricle has no regional wall motion abnormalities. 3. Right ventricular systolic function is normal. The right ventricular size is  normal. 4. No left atrial/left atrial appendage thrombus was detected. 5. Eccentric mitral regurgitation with Coanda effect. There is no pulmonary vein systolic reversal. There is at least moderate mitral regurgitation, likely severe. 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 The mitral valve is normal in structure. No evidence of mitral valve regurgitation. No evidence of mitral stenosis. There is severe prolapse of the middle segment of the anterior leaflet of the mitral valve. 6. The aortic valve is tricuspid. Aortic valve regurgitation is moderate to severe. No aortic stenosis is present. Aortic regurgitation PHT measures 411 msec. 7. The inferior vena cava is normal in size with greater than 50% respiratory variability, suggesting right atrial pressure of 3 mmHg.  FINDINGS Left Ventricle: Left ventricular ejection fraction, by estimation, is 60 to 65%. The left ventricle has normal function. The left ventricle has no regional wall motion abnormalities. The left ventricular internal cavity size was normal in size. There is no left ventricular hypertrophy.  Right Ventricle: The right ventricular size is normal. No increase in right ventricular wall thickness. Right ventricular systolic function is normal.  Left Atrium: Left atrial size was normal in size. No left atrial/left atrial appendage thrombus was detected.  Right Atrium: Right atrial size was normal in size.  Pericardium: There is no evidence of pericardial effusion.  Mitral Valve: Eccentric mitral regurgitation with Coanda effect. There is no pulmonary vein systolic reversal. There is at least moderate mitral regurgitation, likely severe. 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 The mitral valve is normal in structure. There is severe prolapse of the middle segment of the anterior leaflet of the mitral valve. No evidence of mitral valve regurgitation. No evidence of mitral valve  stenosis.  Tricuspid Valve: The tricuspid valve is normal in structure. Tricuspid valve regurgitation is not demonstrated. No evidence of tricuspid stenosis.  Aortic Valve: The aortic valve is tricuspid. Aortic valve regurgitation is moderate to severe. Aortic regurgitation PHT measures 411 msec. No aortic stenosis is  present.  Pulmonic Valve: The pulmonic valve was normal in structure. Pulmonic valve regurgitation is not visualized. No evidence of pulmonic stenosis.  Aorta: The aortic root is normal in size and structure.  Venous: The inferior vena cava is normal in size with greater than 50% respiratory variability, suggesting right atrial pressure of 3 mmHg.  IAS/Shunts: No atrial level shunt detected by color flow Doppler.  Additional Comments: There is a small pleural effusion in the left lateral region.  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 - Exacerbated by moderate aortic regurgitation and Not a candidate for percutaneous intervention (perforation) -Given his critical illness, I am concerned he is not a candidate for CV surgery; I  have discussed this at length with the family; I am not opposed to TCTS consult but I am unclear what achievable goal posts can be set for that he will be able to reach - getting high dose IV lasix for his volume overload, goal is to wean off toxic FIO2   Multifocal Pneumonia and Sepsis - Complicated by acute kidney injury, thrombocytopenia; platelet count is improving, albeit very slowly - continue current critical care management   Hypokalemia - Potassium at 3.4, likely due to high dose Lasix. - Increase K   Hypotension - pressors off 06/10/23   Tracheostomy Difficulty weaning from intubation led to tracheostomy. - lasix  as above   Family Discussion Discussed at length with patients sister, his wife, and her son - review pre-admission course - reviewed images of the AI and MR with family, reviewed report - reviewed [facility] Course - reviewed [facility] course - discussed interventions available for MV and aortic valve - discussed differences between Mitra-Clip and TAVR and surgery - discussed limitations or EOA vs spatial size of mitral valve perforation in those with perforation - discussed limitations of CMR for this patient (would not improve in prognostication) - discussed my clinical concern: if the patient did not survive this admission I would be sad both for him and the family, but I would not be surprised; discussed low albumin, perforation, trach wean difficulties and worsened thrombocytopenia as poor prognostic indications; sister has note pad and we reviewed her questions in detail - sister notes that he is going to live; they would not be amenable to palliative care - I gave my well wishes for him and the family, despite my clinical concern: regardless of his clinical outcome we will never stop caring about or for Mr. Trippe; even if we changed the focus of his care to comfort - though I do not forsee this, if he is planned for surgical intervention, we will perform LHC and RHC prior to surgery - if he is transfers to [facility] or [facility] for surgery, we can powershare images in addition to reports  CRITICAL CARE Performed by: Batsheva Stevick A Alga Southall  Total critical care time: 117 minutes. Critical care time was exclusive of separately billable procedures and treating other patients. Critical care was necessary to treat or prevent imminent or life-threatening deterioration. Critical care was time spent personally by me on the following activities: development of treatment plan with patient and/or surrogate as well as nursing, discussions with consultants, evaluation of patient's response to treatment, examination  of  patient, obtaining history from patient or surrogate, ordering and performing treatments and interventions, ordering and review of laboratory studies, ordering and review of radiographic studies, pulse oximetry and re-evaluation of patient's condition.    Signed, Strother Lam, MD FASE [facility] [place]  [facility]  06/10/2023 11:05 AM       For questions or updates, please contact [facility] Please consult [email or web address] for contact info under Cardiology/STEMI.      Matin Lam, MD FASE [facility] Cardiologist [facility]  [address] [place], [address] [place], [place] [address] [phone]  7:55 AM

## 2023-06-10 NOTE — Progress Notes (Signed)
Pharmacy Antibiotic Note  Joshua Eaton is a 67 y.o. male admitted on 06/03/2023 with  persistent fever .  Pharmacy has been consulted for Zosyn dosing. WBC wnl.   Plan: Zosyn 3.375g IV q8h (4 hour infusion).  Height: 6\' 1"  (185.4 cm) Weight: 89.3 kg (196 lb 13.9 oz) IBW/kg (Calculated) : 79.9  Temp (24hrs), Avg:99.4 F (37.4 C), Min:98.5 F (36.9 C), Max:100.5 F (38.1 C)  Recent Labs  Lab 06/06/23 0228 06/06/23 1436 06/07/23 0305 06/08/23 0354 06/09/23 0829 06/10/23 0434  WBC 8.7  --  9.9 7.3 10.5 10.3  CREATININE 0.78 0.77 0.75 0.99 0.92 1.17    Estimated Creatinine Clearance: 67.3 mL/min (by C-G formula based on SCr of 1.17 mg/dL).    Allergies  Allergen Reactions   Codeine     "sends me into orbit"    Antimicrobials this admission: Vanc 10/3 x1  Cefepime 10/3 > 10/6 Zosyn 10/6 >>  Dose adjustments this admission:   Microbiology results: AFB, fungal cxs pending  Bcx at [facility]: 1/4 Staph epi 10/3 MRSA neg  10/4 TA: NG x2 10/6 Resp cx: negative 10/8 BCx >> ngtd   Thank you for allowing pharmacy to be a part of this patient's care.  Link Snuffer, PharmD, BCPS, BCCCP Please refer to [facility] for [facility] Pharmacy numbers 06/10/2023 10:53 AM

## 2023-06-11 DIAGNOSIS — I351 Nonrheumatic aortic (valve) insufficiency: Secondary | ICD-10-CM | POA: Diagnosis not present

## 2023-06-11 DIAGNOSIS — G9341 Metabolic encephalopathy: Secondary | ICD-10-CM | POA: Diagnosis not present

## 2023-06-11 DIAGNOSIS — J9601 Acute respiratory failure with hypoxia: Secondary | ICD-10-CM | POA: Diagnosis not present

## 2023-06-11 DIAGNOSIS — I340 Nonrheumatic mitral (valve) insufficiency: Secondary | ICD-10-CM | POA: Diagnosis not present

## 2023-06-11 LAB — COMPREHENSIVE METABOLIC PANEL
ALT: 30 U/L (ref 0–44)
AST: 38 U/L (ref 15–41)
Albumin: 1.9 g/dL — ABNORMAL LOW (ref 3.5–5.0)
Alkaline Phosphatase: 49 U/L (ref 38–126)
Anion gap: 7 (ref 5–15)
BUN: 46 mg/dL — ABNORMAL HIGH (ref 8–23)
CO2: 33 mmol/L — ABNORMAL HIGH (ref 22–32)
Calcium: 7.9 mg/dL — ABNORMAL LOW (ref 8.9–10.3)
Chloride: 110 mmol/L (ref 98–111)
Creatinine, Ser: 1.13 mg/dL (ref 0.61–1.24)
GFR, Estimated: >60 mL/min (ref >60)
Glucose, Bld: 154 mg/dL — ABNORMAL HIGH (ref 70–99)
Potassium: 3.7 mmol/L (ref 3.5–5.1)
Sodium: 150 mmol/L — ABNORMAL HIGH (ref 135–145)
Total Bilirubin: 1.3 mg/dL — ABNORMAL HIGH (ref 0.3–1.2)
Total Protein: 5.2 g/dL — ABNORMAL LOW (ref 6.5–8.1)

## 2023-06-11 LAB — CBC
HCT: 33 % — ABNORMAL LOW (ref 39.0–52.0)
Hemoglobin: 10.2 g/dL — ABNORMAL LOW (ref 13.0–17.0)
MCH: 32.1 pg (ref 26.0–34.0)
MCHC: 30.9 g/dL (ref 30.0–36.0)
MCV: 103.8 fL — ABNORMAL HIGH (ref 80.0–100.0)
Platelets: 83 10*3/uL — ABNORMAL LOW (ref 150–400)
RBC: 3.18 MIL/uL — ABNORMAL LOW (ref 4.22–5.81)
RDW: 20.1 % — ABNORMAL HIGH (ref 11.5–15.5)
WBC: 7.8 10*3/uL (ref 4.0–10.5)
nRBC: 0 % (ref 0.0–0.2)

## 2023-06-11 LAB — PHOSPHORUS: Phosphorus: 3 mg/dL (ref 2.5–4.6)

## 2023-06-11 LAB — GLUCOSE, CAPILLARY
Glucose-Capillary: 151 mg/dL — ABNORMAL HIGH (ref 70–99)
Glucose-Capillary: 168 mg/dL — ABNORMAL HIGH (ref 70–99)
Glucose-Capillary: 160 mg/dL — ABNORMAL HIGH (ref 70–99)
Glucose-Capillary: 163 mg/dL — ABNORMAL HIGH (ref 70–99)
Glucose-Capillary: 145 mg/dL — ABNORMAL HIGH (ref 70–99)
Glucose-Capillary: 181 mg/dL — ABNORMAL HIGH (ref 70–99)

## 2023-06-11 LAB — AMMONIA: Ammonia: 26 umol/L (ref 9–35)

## 2023-06-11 LAB — MAGNESIUM: Magnesium: 2.6 mg/dL — ABNORMAL HIGH (ref 1.7–2.4)

## 2023-06-11 MED ORDER — FUROSEMIDE 10 MG/ML IJ SOLN
120.0000 mg | Freq: Three times a day (TID) | INTRAVENOUS | Status: AC
  Administered 2023-06-11 – 2023-06-12 (×3): 120 mg via INTRAVENOUS
  Filled 2023-06-11 (×3): qty 10

## 2023-06-11 MED ORDER — FUROSEMIDE 10 MG/ML IJ SOLN: 80.0000 mg | Freq: Three times a day (TID) | INTRAMUSCULAR | Status: DC

## 2023-06-11 MED ORDER — POTASSIUM CHLORIDE 20 MEQ PO PACK: 40.0000 meq | PACK | Freq: Once | ORAL | Status: DC

## 2023-06-11 NOTE — Progress Notes (Addendum)
NAME:  Joshua Eaton, MRN:  [id number], DOB:  Aug 05, 1956, LOS: 8 ADMISSION DATE:  06/03/2023, CONSULTATION DATE:  06/02/2023 REFERRING MD:  Dr. Nadine Counts [facility] , CHIEF COMPLAINT: Pneumonia  History of Present Illness:  This is a 67 year old male with a past medical history of colon cancer status post right hemicolectomy 2007, COPD, mitral valve Regurgitation who presented to [facility] health on 05/20/2023 for shortness of breath. Prior to presenting, patient was feeling unwell for about 6 days with cough, shortness of breath. He had an initial CXR showing bilateral airspace opacities and was eventually placed on BiPAP and admitted to ICU. On 9/20 he was intubated due to ongoing respiratory failure and antibiotics were widened for multifocal pneumonia. He had echo performed showing EF 60-65%, severe dilated LA, severe MR. Throughout his course at [facility] had ongoing ARDS picture, developed worsening AKI. Eventually, [facility] PCCM team was consulted for transfer here due to ongoing high vent requirements, unable to wean. Additionally, there was concern over need for MV replacement and possible need for right heart cath.   Pertinent  Medical History  Colon cancer, status post hemicolectomy (right) 2007, questionable COPD wears CPAP at home, mitral valve regurgitation   Significant Hospital Events: Including procedures, antibiotic start and stop dates in addition to other pertinent events   Admitted to [facility] health on 05/20/2023 06/03/2023: Transferred from [place] health to [facility] 10/5 failed SBT 10/06: Tracheostomy  06/09/2023: TEE performed at bedside  Interim History / Subjective:  Overnight: Episode of narrow complex tachycardia with heart rate into the 170s.  Resolved on its own.  No intervention was made.  Patient evaluated at bedside this morning.  Patient has still altered.  Not able to do much for me.  Objective   Blood pressure (!) 144/51, pulse (!) 110, temperature 99.4 F (37.4 C),  temperature source Oral, resp. rate 20, height 6\' 1"  (1.854 m), weight 89.3 kg, SpO2 97%. Blood pressures currently with maps between 69-75, currently with rates into the 110s-120s febrile to 101 yesterday. Vent Mode: PRVC FiO2 (%):  [50 %] 50 % Set Rate:  [16 bmp] 16 bmp Vt Set:  [640 mL] 640 mL PEEP:  [5 cmH20] 5 cmH20 Plateau Pressure:  [18 cmH20-24 cmH20] 23 cmH20   Intake/Output Summary (Last 24 hours) at 06/11/2023 0705 Last data filed at 06/11/2023 0600 Gross per 24 hour  Intake 1244.89 ml  Output 5600 ml  Net -4355.11 ml   Filed Weights   06/09/23 0300 06/10/23 0111 06/11/23 4098  Weight: 93.4 kg 89.3 kg 89.3 kg    Examination: General: Tracheostomy in place, critically ill, eyes open HENT: Tracheostomy in place, secretions noted Lungs: Decreased lung sounds bilaterally, coarse breath sounds Cardiovascular: Tachycardic, regular rhythm, grade 3/6 systolic murmur heard throughout Abdomen: Some distention appreciated today Extremities: Improving edema to bilateral upper and lower extremities Neuro: Not following any instructions this morning GU: External catheter in place  Ammonia 26 Phosphorus 3.0 Magnesium 2.6 White cell count 7.8, hemoglobin 10.2, MCV one 3.8, platelets 83 CMP with sodium 150, bicarb 33, creatinine 1.13 Glucose ranging between 142-1 80  Blood cultures with no growth as of yet Respiratory culture growing abundant gram-positive cocci in pairs  -4.3 L yesterday  Resolved Hospital Problem list   AKI GI bleed  Hypophosphatemia  Assessment & Plan:  This is a 67 year old male with past medical history of mitral valve regurgitation, colon cancer status post hemicolectomy in 2007 who presents for concerns of this of breath.  Patient admitted for  further evaluation and management acute hypoxemic respiratory failure  #Acute hypoxemic respiratory failure status post tracheostomy still requiring mechanical ventilation #Pulmonary edema secondary to HFpEF  exacerbation #Severe mitral regurgitation #Aortic valve insufficiency Patient is still requiring ventilator.  Will try to wean today.  Not alert at all today.  Did have conversation with cardiology yesterday who stated patient not a candidate for any procedures.  Had great output yesterday.  Patient still requiring 50% FiO2 and PEEP of 5.  Diuresed well. -Patient became tachycardic yesterday could be due to volume depletion -Continue on ventilator, wean today -Strict I's and O's -Cardiology following Will need to hold diuresing   #Fevers Patient continues to have fevers.  Respiratory cultures are growing gram-positive cocci.  Will continue with Zosyn as this is likely strep.  Zosyn day 2. -Blood cultures to have not grown much -Remove Foley -Right upper quadrant ultrasound was unrevealing -Likely respiratory reason  #History of hypertension Blood pressures currently measuring well.  Discontinued midodrine.  Continue hydralazine 25 mg 8 hours  -Conitnue hydralazine 25 mg every 8 hours -Hold losartan 25 mg daily  #Macrocytic anemia #Thrombocytopenia Thrombocytopenia today at 83 today.  No signs of bleeding.  Will continue to monitor.  Hemoglobin 10.8 today. -Monitor CBC  #Nutrition -Continue tube feeds  #BPH Great urine output.  No concerns. -Continue on external urinary catheter  #Hyperbilirubinemia -Right upper quadrant ultrasound did not reveal a gallbladder, patient did have cholecystectomy per family  #Hypernatremia -Free water flushes    Best Practice (right click and "Reselect all SmartList Selections" daily)   Diet/type: tubefeeds DVT prophylaxis: Heparin GI prophylaxis: PPI Lines: Midline  Foley:  Yes, and it is still needed Code Status:  full code  Labs   CBC: Recent Labs  Lab 06/07/23 0305 06/08/23 0354 06/09/23 0829 06/10/23 0434 06/11/23 0335  WBC 9.9 7.3 10.5 10.3 7.8  HGB 10.1* 9.5* 10.5* 9.9* 10.2*  HCT 31.9* 30.5* 33.8* 32.2* 33.0*  MCV 97.9  99.3 100.3* 100.9* 103.8*  PLT 60* 53* 62* 64* 83*    Basic Metabolic Panel: Recent Labs  Lab 06/04/23 1743 06/05/23 0209 06/06/23 0228 06/06/23 1436 06/07/23 0305 06/08/23 0354 06/09/23 0829 06/10/23 0434 06/11/23 0335  NA  --  146*   < > 144 143 143 141 147* 150*  K  --  3.0*   < > 4.0 3.4* 4.0 3.4* 3.3* 3.7  CL  --  110   < > 108 103 108 102 106 110  CO2  --  27   < > 27 28 27 28 31  33*  GLUCOSE  --  167*   < > 120* 149* 157* 121* 137* 154*  BUN  --  46*   < > 42* 38* 43* 52* 55* 46*  CREATININE  --  0.95   < > 0.77 0.75 0.99 0.92 1.17 1.13  CALCIUM  --  8.3*   < > 8.2* 8.2* 7.6* 7.9* 8.1* 7.9*  MG 2.0 2.0  --  2.8* 2.7* 2.4 2.5*  --  2.6*  PHOS 2.5 2.8  --   --  2.1*  --  4.4  --  3.0   < > = values in this interval not displayed.   GFR: Estimated Creatinine Clearance: 69.7 mL/min (by C-G formula based on SCr of 1.13 mg/dL). Recent Labs  Lab 06/08/23 0354 06/09/23 0829 06/10/23 0434 06/11/23 0335  WBC 7.3 10.5 10.3 7.8    Liver Function Tests: Recent Labs  Lab 06/05/23 0209 06/07/23 0305 06/09/23 1610  06/11/23 0335  AST 28 28 30  38  ALT 14 15 18 30   ALKPHOS 50 52 48 49  BILITOT 1.6* 1.6* 1.5* 1.3*  PROT 4.3* 4.8* 5.0* 5.2*  ALBUMIN 1.7* 1.8* 1.8* 1.9*   No results for input(s): "LIPASE", "AMYLASE" in the last 168 hours. Recent Labs  Lab 06/11/23 0335  AMMONIA 26    ABG    Component Value Date/Time   HCO3 28.6 (H) 06/03/2023 2229   O2SAT 87.8 06/03/2023 2229     Coagulation Profile: No results for input(s): "INR", "PROTIME" in the last 168 hours.  Cardiac Enzymes: No results for input(s): "CKTOTAL", "CKMB", "CKMBINDEX", "TROPONINI" in the last 168 hours.  HbA1C: Hgb A1c MFr Bld  Date/Time Value Ref Range Status  12/09/2015 06:30 PM 5.1 4.8 - 5.6 % Final    Comment:    (NOTE)         Pre-diabetes: 5.7 - 6.4         Diabetes: >6.4         Glycemic control for adults with diabetes: <7.0     CBG: Recent Labs  Lab 06/10/23 1123  06/10/23 1541 06/10/23 1918 06/10/23 2315 06/11/23 0315  GLUCAP 149* 143* 142* 180* 151*    Review of Systems:   Negative Except for what is stated in HPI   Past Medical History:  He,  has a past medical history of Anxiety, Cancer (HCC), Depression, Hypertension, and OCD (obsessive compulsive disorder).   Surgical History:   Past Surgical History:  Procedure Laterality Date   CHOLECYSTECTOMY     COLON SURGERY     cancerous polyp removed   HERNIA REPAIR       Social History:   reports that he has never smoked. He has never used smokeless tobacco. He reports that he does not drink alcohol and does not use drugs.   Family History:  His family history is not on file.   Allergies Allergies  Allergen Reactions   Codeine     "sends me into orbit"     Home Medications  Prior to Admission medications   Medication Sig Start Date End Date Taking? Authorizing Provider  acetaminophen (TYLENOL) 325 MG tablet Take 650 mg by mouth every 6 (six) hours as needed for moderate pain.   Yes [provider]  aspirin EC 81 MG tablet Take 81 mg by mouth daily. Swallow whole.   Yes [provider]  benzonatate (TESSALON) 200 MG capsule Take 200 mg by mouth 3 (three) times daily as needed for cough. 05/11/23  Yes [provider]  budesonide (PULMICORT) 0.5 MG/2ML nebulizer solution Take 0.5 mg by nebulization 2 (two) times daily.   Yes [provider]  chlorhexidine (PERIDEX) 0.12 % solution Use as directed 15 mLs in the mouth or throat 2 (two) times daily.   Yes [provider]  dexmedeTOMIDine HCl (PRECEDEX IV) Inject 1 Dose into the vein every 12 (twelve) hours. in 100 mL 10.55mL/hr Q12H   Yes [provider]  feeding supplement (OSMOLITE 1 CAL) LIQD Take 1,000 mLs by mouth daily. 51mL/hr   Yes [provider]  fentaNYL citrate (PF) 10 mcg/mL in sodium chloride 0.9 % 200 mL Inject 50 mcg/hr into the vein See admin  instructions. 2,578mcg in Rate: 41mL/hr Freq: Q12H   Yes [provider]  guaiFENesin-Codeine 200-20 MG/10ML SOLN Take 10 mLs by mouth every 6 (six) hours as needed (Cough).   Yes [provider]  ipratropium-albuterol (DUONEB) 0.5-2.5 (3)  MG/3ML SOLN Take 3 mLs by nebulization every 6 (six) hours.   Yes [provider]  levofloxacin (LEVAQUIN) 750 MG/150ML SOLN Inject 750 mg into the vein daily. Rate: 111ml/hr Dur: 90 minutes Freq: Q24H Started 05/31/23 End 06/03/23   Yes [provider]  LORazepam (ATIVAN) 0.5 MG tablet Take 0.5 mg by mouth in the morning.   Yes [provider]  LORazepam (ATIVAN) 1 MG tablet Take 1 mg by mouth at bedtime.   Yes [provider]  LORazepam (ATIVAN) 2 MG/ML injection Inject 0.5 mg into the vein every 6 (six) hours as needed for anxiety.   Yes [provider]  methylPREDNISolone acetate (DEPO-MEDROL) 40 MG/ML injection 20 mg every 12 (twelve) hours. Started 05/29/23 End 06/12/23   Yes [provider]  METOCLOPRAMIDE HCL IJ Inject 10 mg into the vein every 6 (six) hours.   Yes [provider]  Multiple Vitamins-Minerals (MULTIVITAL PO) Take 5 mLs by mouth in the morning and at bedtime.   Yes [provider]  OLANZapine (ZYPREXA) 2.5 MG tablet Take 1 tablet (2.5 mg total) by mouth at bedtime. 01/20/23  Yes Cottle, Steva Ready., MD  ondansetron [facility]) 4 MG/2ML SOLN injection Inject 4 mg into the vein every 6 (six) hours as needed for nausea or vomiting.   Yes [provider]  pantoprazole 40 mg in sodium chloride 0.9 % 100 mL Inject 40 mg into the vein every 12 (twelve) hours.   Yes [provider]  polyethylene glycol (MIRALAX / GLYCOLAX) 17 g packet Take 17 g by mouth 2 (two) times daily as needed for moderate constipation. Give with lunch and evening meal if no BM during the day as needed   Yes [provider]  promethazine (PHENERGAN) 25  MG/ML injection Inject 12.5 mg into the muscle every 6 (six) hours as needed for nausea or vomiting.   Yes [provider]  propofol (DIPRIVAN) 1000 MG/100ML EMUL injection Inject 2.939 mLs into the vein See admin instructions. Rate: 2.91mL/hr Dur: 34hr 2 min Freq: Every 6 hours   Yes [provider]  propranolol (INDERAL) 20 MG tablet TAKE 1 TO 2 TABLETS BY MOUTH  TWICE DAILY AS NEEDED 01/21/23  Yes Cottle, Steva Ready., MD  sennosides-docusate sodium (SENOKOT-S) 8.6-50 MG tablet Take 1 tablet by mouth 2 (two) times daily as needed for constipation.   Yes [provider]  zolpidem (AMBIEN) 5 MG tablet Take 5 mg by mouth at bedtime as needed for sleep.   Yes [provider]  doxycycline (VIBRAMYCIN) 100 MG capsule Take 100 mg by mouth 2 (two) times daily. Patient not taking: Reported on 06/05/2023 05/14/23   [provider]  gabapentin (NEURONTIN) 100 MG capsule Take 300 mg by mouth at bedtime. Patient not taking: Reported on 06/05/2023 12/11/15   [provider]  loratadine (CLARITIN) 10 MG tablet Take 10 mg by mouth daily as needed for allergies. Patient not taking: Reported on 06/05/2023 05/11/23   [provider]  meloxicam (MOBIC) 7.5 MG tablet Take 7.5 mg by mouth 2 (two) times daily. Patient not taking: Reported on 06/05/2023 04/04/23   [provider]  methocarbamol (ROBAXIN) 500 MG tablet Take 500 mg by mouth 2 (two) times daily as needed for muscle spasms. Patient not taking: Reported on 06/05/2023 04/04/23   [provider]  predniSONE (DELTASONE) 20 MG tablet Take 10-40 mg by mouth See admin instructions. AKE 2 TABLETS (40mg ) BY MOUTH ONCE DAILY UNTIL SYMPTOMS IMPROVE,  THEN DECREASE TO 1/2 TABLET (10mg ) EVERY OTHER DAY Patient not taking: Reported on 06/05/2023 05/14/23   [provider]  promethazine-dextromethorphan (PROMETHAZINE-DM) 6.25-15 MG/5ML syrup Take 5 mLs by mouth every 6 (six) hours as needed for  cough. Patient not taking: Reported on 06/05/2023 05/11/23   [provider]  sertraline (ZOLOFT) 100 MG tablet TAKE 2 TABLETS BY MOUTH IN THE  MORNING AND 1 TABLET BY MOUTH IN THE EVENING 03/17/23   Cottle, Steva Ready., MD     Critical care time: 34 mins     Modena Slater, DO Internal Medicine Resident PGY-2 Pager: [phone]

## 2023-06-11 NOTE — Progress Notes (Signed)
PCCM Progress Note  Extensively discussed GOC with family including his sister, Joshua Eaton, and son, Joshua Eaton. They wish to remain aggressive with his care and give him every medical opportunity to improve. They understand that he is currently not a candidate for surgery for his mitral valve regurgitation but remain cautiously optimistic if we can improve his medical status. We discussed current barriers including encephalopathy, presumed recurrent respiratory infection +/- pulmonary edema secondary to heart failure. When patient is stable they would like to discuss potential transfer to hospital that would be willing to perform surgery. Addressed questions and concerns.  Continue current care. Remain full code.  Care Time 80 min  Mechele Collin, M.D. [facility] Pulmonary/Critical Care Medicine 06/11/2023 4:10 PM   Please see Amion for pager number to reach on-call Pulmonary and Critical Care Team.

## 2023-06-11 NOTE — Progress Notes (Signed)
Progress Note  Patient Name: Joshua Eaton Date of Encounter: 06/11/2023 Primary Cardiologist: None   Subjective   Still febrile  Weaned down to FIO2 of 40%.  Vital Signs    Vitals:   06/11/23 0900 06/11/23 0930 06/11/23 0951 06/11/23 1123  BP: (!) 139/59 (!) 153/58    Pulse: (!) 103 (!) 113    Resp: (!) 25 (!) 30    Temp:   (!) 101.3 F (38.5 C) 99.7 F (37.6 C)  TempSrc:   Oral Axillary  SpO2: 96% 95%    Weight:      Height:        Intake/Output Summary (Last 24 hours) at 06/11/2023 1145 Last data filed at 06/11/2023 0800 Gross per 24 hour  Intake 1338.38 ml  Output 6000 ml  Net -4661.62 ml   Filed Weights   06/09/23 0300 06/10/23 0111 06/11/23 0337  Weight: 93.4 kg 89.3 kg 89.3 kg    Physical Exam   GEN: Chronically ill Neck: JVD noted and persistent Cardiac: regular tachycardia; there systolic and diastolic murmurs noted Respiratory: Diffuse rhonchi MS: Bilateral edema  Labs   Telemetry: SR to largely sinus tachycardia with ectopy   Chemistry Recent Labs  Lab 06/07/23 0305 06/08/23 0354 06/09/23 0829 06/10/23 0434 06/11/23 0335  NA 143   < > 141 147* 150*  K 3.4*   < > 3.4* 3.3* 3.7  CL 103   < > 102 106 110  CO2 28   < > 28 31 33*  GLUCOSE 149*   < > 121* 137* 154*  BUN 38*   < > 52* 55* 46*  CREATININE 0.75   < > 0.92 1.17 1.13  CALCIUM 8.2*   < > 7.9* 8.1* 7.9*  PROT 4.8*  --  5.0*  --  5.2*  ALBUMIN 1.8*  --  1.8*  --  1.9*  AST 28  --  30  --  38  ALT 15  --  18  --  30  ALKPHOS 52  --  48  --  49  BILITOT 1.6*  --  1.5*  --  1.3*  GFRNONAA >60   < > >60 >60 >60  ANIONGAP 12   < > 11 10 7    < > = values in this interval not displayed.     Hematology Recent Labs  Lab 06/09/23 0829 06/10/23 0434 06/11/23 0335  WBC 10.5 10.3 7.8  RBC 3.37* 3.19* 3.18*  HGB 10.5* 9.9* 10.2*  HCT 33.8* 32.2* 33.0*  MCV 100.3* 100.9* 103.8*  MCH 31.2 31.0 32.1  MCHC 31.1 30.7 30.9  RDW 20.5* 20.3* 20.1*  PLT 62* 64* 83*    Cardiac  EnzymesNo results for input(s): "TROPONINI" in the last 168 hours. No results for input(s): "TROPIPOC" in the last 168 hours.   BNP No results for input(s): "BNP", "PROBNP" in the last 168 hours.    DDimer No results for input(s): "DDIMER" in the last 168 hours.   Cardiac Studies   Cardiac Studies & Procedures       ECHOCARDIOGRAM  ECHOCARDIOGRAM COMPLETE 06/04/2023  Narrative ECHOCARDIOGRAM REPORT    Patient Name:   Joshua Eaton Date of Exam: 06/04/2023 Medical Rec #:  [id number]  Height:       73.0 in Accession #:    [id number] Weight:       225.3 lb Date of Birth:  1955/11/24   BSA:          2.263 m Patient Age:  67 years   BP:           123/46 mmHg Patient Gender: M          HR:           69 bpm. Exam Location:  Inpatient  Procedure: 2D Echo, Cardiac Doppler and Color Doppler  Indications:    MV disorder I05.9  History:        Patient has no prior history of Echocardiogram examinations. Mitral Valve Disease and Aortic Valve Disease; Risk Factors:Non-Smoker.  Sonographer:    Dondra Prader RVT RCS Referring Phys: [id number] Clarene Critchley [facility]   Sonographer Comments: Suboptimal parasternal window. Image acquisition challenging due to patient body habitus and Image acquisition challenging due to respiratory motion. IMPRESSIONS   1. There is moderate prolaspe of the anterior mitral leaflet, unable to determine if flail, recommend TEE. The mitral valve is abnormal. Severe mitral valve regurgitation. No evidence of mitral stenosis. 2. Left ventricular ejection fraction, by estimation, is 60 to 65%. The left ventricle has normal function. The left ventricle has no regional wall motion abnormalities. There is moderate asymmetric left ventricular hypertrophy of the inferior segment. Left ventricular diastolic function could not be evaluated. 3. Right ventricular systolic function is normal. The right ventricular size is normal. 4. Left atrial size was severely dilated. 5. The aortic  valve is normal in structure. Aortic valve regurgitation is mild to moderate. Aortic valve sclerosis is present, with no evidence of aortic valve stenosis. Aortic regurgitation PHT measures 362 msec. 6. The inferior vena cava is normal in size with greater than 50% respiratory variability, suggesting right atrial pressure of 3 mmHg.  FINDINGS Left Ventricle: Left ventricular ejection fraction, by estimation, is 60 to 65%. The left ventricle has normal function. The left ventricle has no regional wall motion abnormalities. The left ventricular internal cavity size was normal in size. There is moderate asymmetric left ventricular hypertrophy of the inferior segment. Left ventricular diastolic function could not be evaluated due to mitral regurgitation (moderate or greater). Left ventricular diastolic function could not be evaluated.  Right Ventricle: The right ventricular size is normal. No increase in right ventricular wall thickness. Right ventricular systolic function is normal.  Left Atrium: Left atrial size was severely dilated.  Right Atrium: Right atrial size was normal in size.  Pericardium: Trivial pericardial effusion is present. The pericardial effusion is anterior to the right ventricle.  Mitral Valve: There is moderate prolaspe of the anterior mitral leaflet, unable to determine if flail, recommend TEE. The mitral valve is abnormal. Severe mitral valve regurgitation. No evidence of mitral valve stenosis. MV peak gradient, 99.6 mmHg. The mean mitral valve gradient is 81.0 mmHg.  Tricuspid Valve: The tricuspid valve is normal in structure. Tricuspid valve regurgitation is not demonstrated. No evidence of tricuspid stenosis.  Aortic Valve: The aortic valve is normal in structure. Aortic valve regurgitation is mild to moderate. Aortic regurgitation PHT measures 362 msec. Aortic valve sclerosis is present, with no evidence of aortic valve stenosis. Aortic valve mean gradient measures 8.2  mmHg. Aortic valve peak gradient measures 15.5 mmHg. Aortic valve area, by VTI measures 3.52 cm.  Pulmonic Valve: The pulmonic valve was not well visualized.  Aorta: The aortic root is normal in size and structure.  Venous: The inferior vena cava is normal in size with greater than 50% respiratory variability, suggesting right atrial pressure of 3 mmHg.  IAS/Shunts: No atrial level shunt detected by color flow Doppler.   LEFT VENTRICLE PLAX 2D LVIDd:  5.30 cm   Diastology LVIDs:         3.50 cm   LV e' medial:    9.46 cm/s LV PW:         1.40 cm   LV E/e' medial:  16.7 LV IVS:        1.00 cm   LV e' lateral:   14.70 cm/s LVOT diam:     2.10 cm   LV E/e' lateral: 10.7 LV SV:         130 LV SV Index:   57 LVOT Area:     3.46 cm   RIGHT VENTRICLE             IVC RV S prime:     13.80 cm/s  IVC diam: 2.00 cm TAPSE (M-mode): 2.9 cm  LEFT ATRIUM              Index        RIGHT ATRIUM           Index LA diam:        4.10 cm  1.81 cm/m   RA Area:     12.90 cm LA Vol (A2C):   140.0 ml 61.85 ml/m  RA Volume:   30.60 ml  13.52 ml/m LA Vol (A4C):   113.8 ml 50.28 ml/m LA Biplane Vol: 142.0 ml 62.74 ml/m AORTIC VALVE AV Area (Vmax):    3.34 cm AV Area (Vmean):   3.13 cm AV Area (VTI):     3.52 cm AV Vmax:           196.80 cm/s AV Vmean:          130.600 cm/s AV VTI:            0.368 m AV Peak Grad:      15.5 mmHg AV Mean Grad:      8.2 mmHg LVOT Vmax:         190.00 cm/s LVOT Vmean:        118.000 cm/s LVOT VTI:          0.374 m LVOT/AV VTI ratio: 1.02 AI PHT:            362 msec  AORTA Ao Root diam: 3.50 cm Ao Asc diam:  3.50 cm  MITRAL VALVE MV Area (PHT): 2.61 cm     SHUNTS MV Area VTI:   0.92 cm     Systemic VTI:  0.37 m MV Peak grad:  99.6 mmHg    Systemic Diam: 2.10 cm MV Mean grad:  81.0 mmHg MV Vmax:       4.99 m/s MV Vmean:      438.0 cm/s MV Decel Time: 291 msec MV E velocity: 158.00 cm/s MV A velocity: 124.00 cm/s MV E/A ratio:   1.27  Kardie Tobb DO Electronically signed by Thomasene Ripple DO Signature Date/Time: 06/04/2023/11:15:56 AM    Final   TEE  ECHO TEE 06/09/2023  Narrative TRANSESOPHOGEAL ECHO REPORT    Patient Name:   THEOTIS GERDEMAN Date of Exam: 06/09/2023 Medical Rec #:  [id number]  Height:       73.0 in Accession #:    [id number] Weight:       205.9 lb Date of Birth:  [birth year]   BSA:          2.178 m Patient Age:    67 years   BP:           116/40 mmHg Patient Gender: M  HR:           52 bpm. Exam Location:  Inpatient  Procedure: Transesophageal Echo  Indications:     mitral regurgitation  History:         Patient has prior history of Echocardiogram examinations, most recent 06/04/2023. COPD.  Sonographer:     Delcie Roch RDCS Referring Phys:  [id number] Perlie Gold Diagnosing Phys: Chilton Si MD   Sonographer Comments: Echo performed with patient supine and on artificial respirator.   PROCEDURE: After discussion of the risks and benefits of a TEE, an informed consent was obtained from a family member. The patient was intubated. The transesophogeal probe was passed without difficulty through the esophogus of the patient. Imaged were obtained with the patient in a left lateral decubitus position. Sedation performed by performing physician. The patient's vital signs; including heart rate, blood pressure, and oxygen saturation; remained stable throughout the procedure. The patient developed no complications during the procedure.  IMPRESSIONS   1. Images were technically challenging due to patient movement and movement/impedence from the NG tube. 2. Left ventricular ejection fraction, by estimation, is 60 to 65%. The left ventricle has normal function. The left ventricle has no regional wall motion abnormalities. 3. Right ventricular systolic function is normal. The right ventricular size is normal. 4. No left atrial/left atrial appendage thrombus was detected. 5.  Eccentric mitral regurgitation with Coanda effect. There is no pulmonary vein systolic reversal. There is at least moderate mitral regurgitation, likely severe. 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 The mitral valve is normal in structure. No evidence of mitral valve regurgitation. No evidence of mitral stenosis. There is severe prolapse of the middle segment of the anterior leaflet of the mitral valve. 6. The aortic valve is tricuspid. Aortic valve regurgitation is moderate to severe. No aortic stenosis is present. Aortic regurgitation PHT measures 411 msec. 7. The inferior vena cava is normal in size with greater than 50% respiratory variability, suggesting right atrial pressure of 3 mmHg.  FINDINGS Left Ventricle: Left ventricular ejection fraction, by estimation, is 60 to 65%. The left ventricle has normal function. The left ventricle has no regional wall motion abnormalities. The left ventricular internal cavity size was normal in size. There is no left ventricular hypertrophy.  Right Ventricle: The right ventricular size is normal. No increase in right ventricular wall thickness. Right ventricular systolic function is normal.  Left Atrium: Left atrial size was normal in size. No left atrial/left atrial appendage thrombus was detected.  Right Atrium: Right atrial size was normal in size.  Pericardium: There is no evidence of pericardial effusion.  Mitral Valve: Eccentric mitral regurgitation with Coanda effect. There is no pulmonary vein systolic reversal. There is at least moderate mitral regurgitation, likely severe. 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 The mitral valve is normal in structure. There is severe prolapse of the middle segment of the anterior leaflet of the mitral valve. No evidence of mitral valve regurgitation. No evidence of mitral valve stenosis.  Tricuspid Valve: The tricuspid valve is normal in structure. Tricuspid valve  regurgitation is not demonstrated. No evidence of tricuspid stenosis.  Aortic Valve: The aortic valve is tricuspid. Aortic valve regurgitation is moderate to severe. Aortic regurgitation PHT measures 411 msec. No aortic stenosis is present.  Pulmonic Valve: The pulmonic valve was normal in structure. Pulmonic valve regurgitation is not visualized. No evidence of pulmonic stenosis.  Aorta: The aortic root is normal in size and structure.  Venous: The  inferior vena cava is normal in size with greater than 50% respiratory variability, suggesting right atrial pressure of 3 mmHg.  IAS/Shunts: No atrial level shunt detected by color flow Doppler.  Additional Comments: There is a small pleural effusion in the left lateral region.  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 - Exacerbated by moderate aortic regurgitation and Not a candidate for percutaneous intervention (perforation) - getting high dose IV lasix for his volume overload, would consider increasing back to 120 IV lasix - he has been successfully down to 40 % FIO2   Multifocal Pneumonia and Sepsis - Complicated by acute kidney injury, he is still febrile; tracheal aspirate has grown and he is now on antibiotics - thrombocytopenia; platelet count is improving - continue current critical care management; reviewed with critical care team    Hypokalemia - Potassium at 3.7, likely due to high dose Lasix. - Increase K support   Hypotension - pressors off 06/10/23   Tracheostomy Difficulty weaning from intubation led to tracheostomy. - lasix as above   After exam, spoke with Layne Benton RN after seeing and assessing patient - family has asked that both myself and members of [facility] (Cardiology  practice) are no longer part of his care.  After today's assessments, we will no longer see unless there are urgent or emergent care needs.   - Drs. Algie Coffer and Sharyn Lull are not affiliated with our practice and could be seen in consultation. - will CC my managing partner     For questions or updates, please contact [facility] Please consult [email or web address] for contact info under Cardiology/STEMI.      Mikes Lam, MD FASE [facility] Cardiologist [facility]  [address] [place], [address] [place], [place] [address] [phone]  11:45 AM

## 2023-06-11 NOTE — Progress Notes (Signed)
SLP Cancellation Note  Patient Details Name: Joshua Eaton MRN: [id number] DOB: 01-29-1956   Cancelled treatment:       Reason Eval/Treat Not Completed: Patient not medically ready. Pt still on vent and mentation does not support use of inline PMV. SLP continues to follow for readiness.    Mahala Menghini., M.A. CCC-SLP Acute Rehabilitation Services Office [phone]  Secure chat preferred  06/11/2023, 7:41 AM

## 2023-06-11 NOTE — Progress Notes (Signed)
eLink Physician-Brief Progress Note Patient Name: Joshua Eaton DOB: 1956-07-03 MRN: [id number]   Date of Service  06/11/2023  HPI/Events of Note  67 y.o. male with a history of severe mitral regurgitation noted on Echo in 05/2023 hypertension, obstructive sleep apnea on CPAP, colon cancer s/p right hemicolectomy in 2007, severe anxiety, and OCD who is being seen 06/09/2023 for the evaluation of severe mitral regurgitation and moderate aortic insufficiency   Had an episode of tachycardia up to the 170s resolved spontaneously.  No electrolyte disturbances.   eICU Interventions  Continue observation.  No immediate intervention indicated.     Intervention Category Intermediate Interventions: Arrhythmia - evaluation and management  Joshua Eaton 06/11/2023, 6:24 AM

## 2023-06-11 NOTE — Consult Note (Signed)
[address]       [place] [address]             [phone]        Liev Brockbank [facility] Medical Record #[id number] Date of Birth: 25-Feb-1956  Referring:   Modena Slater, DO  Primary Care: Kaleen Mask, MD Primary Cardiologist: None  Reason for consult: Evaluation for potential surgical management of severe mitral insufficiency.   History of Present Illness:     Mr. Joshua Eaton is a 67 year old gentleman with a past history of colon cancer status post right hemicolectomy in 2007, status postcholecystectomy, status post hernia repair, and who has a history of anxiety, depression, and obsessive-compulsive disorder.  He is a lifelong non-smoker.  He presented to the [facility] on 05/20/2023 with complaint of shortness of breath of about 6 days duration.  Initial chest x-ray showed multifocal pulmonary densities  ARDS or possibly pneumonia.  Suggestive of ARDS versus pneumonia.  He was treated aggressively with IV antibiotics and placed on BiPAP.  His respiratory status deteriorated requiring intubation by the second hospital day.  An echocardiogram was obtained at [facility] showing a left ventricular ejection fraction of around 65% with a severely dilated left atrium and severe MR.  His respiratory status did not improve over the next few days despite aggressive diuresis and antibiotic therapy.  He was transferred to [facility] and admitted by the critical care team for ongoing management.  He failed attempts to be weaned from the ventilator.  A percutaneous tracheostomy was performed by the critical care team 06/06/2023.  Cardiology consultation was requested and the patient was seen by Dr. Izora Ribas.  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  Dr. Izora Ribas documented in detail his discussions with Mr. Joshua Eaton family regarding the various options for management of severe MR along with the reasons why Mr. Joshua Eaton is not a suitable candidate for any of them.  Mr. Joshua Eaton family was also informed that he is likely not an acceptable candidate for open heart surgery to repair the valve but they have requested consultation with CT surgery to discuss this further.  As of today, Mr. Joshua Eaton family has asked that Dr. Izora Ribas and members of the [facility] team no longer participate in his care.    Current Activity/ Functional Status: Patient is mechanically ventilated by way of tracheostomy and not responsive.   Zubrod Score: At the time of surgery this patient's most appropriate activity status/level should be described as: []     0    Normal activity, no symptoms []     1    Restricted in physical strenuous activity but ambulatory, able to do out light work []     2    Ambulatory and capable of self care, unable to do work activities, up and about                 more than 50%  Of the time                            []     3    Only limited self care, in bed greater than 50% of waking hours []     4    Completely disabled,  no self care, confined to bed or chair [x]     5    Moribund  Past Medical History:  Diagnosis Date   Anxiety    Cancer (HCC)    Depression    Hypertension    OCD (obsessive compulsive disorder)     Past Surgical History:  Procedure Laterality Date   CHOLECYSTECTOMY     COLON SURGERY     cancerous polyp removed   HERNIA REPAIR      Social History   Tobacco Use  Smoking Status Never  Smokeless Tobacco Never    Social History   Substance and Sexual Activity  Alcohol Use No     Allergies  Allergen Reactions   Codeine     "sends me into orbit"    Current Facility-Administered Medications  Medication Dose Route Frequency Provider Last Rate  Last Admin   0.9 %  sodium chloride infusion (Manually program via Guardrails IV Fluids)   Intravenous Once Steffanie Dunn, DO   Held at 06/05/23 1846   acetaminophen (TYLENOL) 160 MG/5ML solution 650 mg  650 mg Per Tube Q6H PRN Doristine Counter, RPH   650 mg at 06/11/23 4010   Chlorhexidine Gluconate Cloth 2 % PADS 6 each  6 each Topical Daily Steffanie Dunn, DO   6 each at 06/11/23 1040   clonazePAM (KLONOPIN) tablet 0.5 mg  0.5 mg Per Tube BID Doristine Counter, RPH   0.5 mg at 06/11/23 1052   famotidine (PEPCID) tablet 20 mg  20 mg Per Tube BID Doristine Counter, RPH   20 mg at 06/11/23 1052   feeding supplement (PROSource TF20) liquid 60 mL  60 mL Per Tube Daily Duayne Cal, NP   60 mL at 06/11/23 1049   feeding supplement (VITAL 1.5 CAL) liquid 1,000 mL  1,000 mL Per Tube Continuous Steffanie Dunn, DO 60 mL/hr at 06/11/23 0700 Infusion Verify at 06/11/23 0700   fentaNYL (SUBLIMAZE) injection 25-100 mcg  25-100 mcg Intravenous Q30 min PRN Modena Slater, DO   100 mcg at 06/11/23 0134   fiber supplement (BANATROL TF) liquid 60 mL  60 mL Per Tube BID Karie Fetch P, DO   60 mL at 06/11/23 1049   folic acid (FOLVITE) tablet 1 mg  1 mg Per Tube Daily Doristine Counter, RPH   1 mg at 06/11/23 1052   free water 200 mL  200 mL Per Tube Q4H Karie Fetch P, DO   200 mL at 06/11/23 0758   furosemide (LASIX) 120 mg in dextrose 5 % 50 mL IVPB  120 mg Intravenous Q8H Patel, Amar, DO       heparin injection 5,000 Units  5,000 Units Subcutaneous Q8H Steffanie Dunn, DO   5,000 Units at 06/11/23 0500   hydrALAZINE (APRESOLINE) tablet 25 mg  25 mg Per Tube Q8H Millen, Jessica B, RPH   25 mg at 06/11/23 0500   insulin aspart (novoLOG) injection 2 Units  2 Units Subcutaneous Q4H Modena Slater, DO   2 Units at 06/11/23 0800   insulin aspart (novoLOG) injection 2-6 Units  2-6 Units Subcutaneous Q4H Karie Fetch P, DO   4 Units at 06/11/23 0759   ipratropium-albuterol (DUONEB) 0.5-2.5 (3) MG/3ML nebulizer solution 3 mL  3 mL  Nebulization Q4H PRN Pia Mau D, PA-C       loperamide HCl (IMODIUM) 1 MG/7.5ML suspension 2 mg  2 mg Per Tube PRN Charlott Holler, MD  multivitamin with minerals tablet 1 tablet  1 tablet Per Tube Daily Doristine Counter, RPH   1 tablet at 06/11/23 1052   Oral care mouth rinse  15 mL Mouth Rinse Q2H ClarkVirl Axe, DO   15 mL at 06/11/23 1041   Oral care mouth rinse  15 mL Mouth Rinse PRN Karie Fetch P, DO       oxyCODONE (Oxy IR/ROXICODONE) immediate release tablet 5 mg  5 mg Oral Q6H PRN Modena Slater, DO       oxyCODONE (ROXICODONE) 5 MG/5ML solution 10 mg  10 mg Per Tube Q6H Patel, Amar, DO   10 mg at 06/11/23 1050   piperacillin-tazobactam (ZOSYN) IVPB 3.375 g  3.375 g Intravenous Q8H Karie Fetch P, DO 12.5 mL/hr at 06/11/23 0700 Infusion Verify at 06/11/23 0700   polyethylene glycol (MIRALAX / GLYCOLAX) packet 17 g  17 g Per Tube Daily PRN Jardin, Carla G, RPH       potassium chloride (KLOR-CON) packet 40 mEq  40 mEq Per Tube Once Chandrasekhar, Mahesh A, MD       potassium chloride (KLOR-CON) packet 80 mEq  80 mEq Per Tube BID Link Snuffer B, RPH   80 mEq at 06/11/23 1052   sodium chloride flush (NS) 0.9 % injection 10-40 mL  10-40 mL Intracatheter Q12H Cheri Fowler, MD   20 mL at 06/11/23 1041   sodium chloride flush (NS) 0.9 % injection 10-40 mL  10-40 mL Intracatheter PRN Cheri Fowler, MD       sodium chloride flush (NS) 0.9 % injection 10-40 mL  10-40 mL Intracatheter Q12H Charlott Holler, MD   10 mL at 06/10/23 2104   sodium chloride flush (NS) 0.9 % injection 10-40 mL  10-40 mL Intracatheter PRN Charlott Holler, MD        Medications Prior to Admission  Medication Sig Dispense Refill Last Dose   acetaminophen (TYLENOL) 325 MG tablet Take 650 mg by mouth every 6 (six) hours as needed for moderate pain.   Unk   aspirin EC 81 MG tablet Take 81 mg by mouth daily. Swallow whole.   05/31/2023   benzonatate (TESSALON) 200 MG capsule Take 200 mg by mouth 3 (three) times daily as  needed for cough.   Unk   budesonide (PULMICORT) 0.5 MG/2ML nebulizer solution Take 0.5 mg by nebulization 2 (two) times daily.   06/02/2023   chlorhexidine (PERIDEX) 0.12 % solution Use as directed 15 mLs in the mouth or throat 2 (two) times daily.   06/02/2023   dexmedeTOMIDine HCl (PRECEDEX IV) Inject 1 Dose into the vein every 12 (twelve) hours. in 100 mL 10.31mL/hr Q12H   Unk   feeding supplement (OSMOLITE 1 CAL) LIQD Take 1,000 mLs by mouth daily. 30mL/hr   06/01/2023   fentaNYL citrate (PF) 10 mcg/mL in sodium chloride 0.9 % 200 mL Inject 50 mcg/hr into the vein See admin instructions. 2,510mcg in Rate: 12mL/hr Freq: Q12H   Unk   guaiFENesin-Codeine 200-20 MG/10ML SOLN Take 10 mLs by mouth every 6 (six) hours as needed (Cough).   Unk   ipratropium-albuterol (DUONEB) 0.5-2.5 (3) MG/3ML SOLN Take 3 mLs by nebulization every 6 (six) hours.   06/02/2023   levofloxacin (LEVAQUIN) 750 MG/150ML SOLN Inject 750 mg into the vein daily. Rate: 161ml/hr Dur: 90 minutes Freq: Q24H Started 05/31/23 End 06/03/23   06/01/2023   LORazepam (ATIVAN) 0.5 MG tablet Take 0.5 mg by mouth in the morning.   Unk  LORazepam (ATIVAN) 1 MG tablet Take 1 mg by mouth at bedtime.   05/20/2023   LORazepam (ATIVAN) 2 MG/ML injection Inject 0.5 mg into the vein every 6 (six) hours as needed for anxiety.   05/23/2023   methylPREDNISolone acetate (DEPO-MEDROL) 40 MG/ML injection 20 mg every 12 (twelve) hours. Started 05/29/23 End 06/12/23   06/02/2023   METOCLOPRAMIDE HCL IJ Inject 10 mg into the vein every 6 (six) hours.   06/02/2023   Multiple Vitamins-Minerals (MULTIVITAL PO) Take 5 mLs by mouth in the morning and at bedtime.   06/01/2023   OLANZapine (ZYPREXA) 2.5 MG tablet Take 1 tablet (2.5 mg total) by mouth at bedtime. 90 tablet 1 06/01/2023   ondansetron (ZOFRAN) 4 MG/2ML SOLN injection Inject 4 mg into the vein every 6 (six) hours as needed for nausea or vomiting.   Unk   pantoprazole 40 mg in sodium  chloride 0.9 % 100 mL Inject 40 mg into the vein every 12 (twelve) hours.   06/02/2023   polyethylene glycol (MIRALAX / GLYCOLAX) 17 g packet Take 17 g by mouth 2 (two) times daily as needed for moderate constipation. Give with lunch and evening meal if no BM during the day as needed   Unk   promethazine (PHENERGAN) 25 MG/ML injection Inject 12.5 mg into the muscle every 6 (six) hours as needed for nausea or vomiting.   Unk   propofol (DIPRIVAN) 1000 MG/100ML EMUL injection Inject 2.939 mLs into the vein See admin instructions. Rate: 2.928mL/hr Dur: 34hr 2 min Freq: Every 6 hours   06/02/2023   propranolol (INDERAL) 20 MG tablet TAKE 1 TO 2 TABLETS BY MOUTH  TWICE DAILY AS NEEDED 400 tablet 2 Unk   sennosides-docusate sodium (SENOKOT-S) 8.6-50 MG tablet Take 1 tablet by mouth 2 (two) times daily as needed for constipation.   Unk   zolpidem (AMBIEN) 5 MG tablet Take 5 mg by mouth at bedtime as needed for sleep.   Unk   doxycycline (VIBRAMYCIN) 100 MG capsule Take 100 mg by mouth 2 (two) times daily. (Patient not taking: Reported on 06/05/2023)   Not Taking   gabapentin (NEURONTIN) 100 MG capsule Take 300 mg by mouth at bedtime. (Patient not taking: Reported on 06/05/2023)   Not Taking   loratadine (CLARITIN) 10 MG tablet Take 10 mg by mouth daily as needed for allergies. (Patient not taking: Reported on 06/05/2023)   Not Taking   meloxicam (MOBIC) 7.5 MG tablet Take 7.5 mg by mouth 2 (two) times daily. (Patient not taking: Reported on 06/05/2023)   Not Taking   methocarbamol (ROBAXIN) 500 MG tablet Take 500 mg by mouth 2 (two) times daily as needed for muscle spasms. (Patient not taking: Reported on 06/05/2023)   Not Taking   predniSONE (DELTASONE) 20 MG tablet Take 10-40 mg by mouth See admin instructions. AKE 2 TABLETS (40mg ) BY MOUTH ONCE DAILY UNTIL SYMPTOMS IMPROVE, THEN DECREASE TO 1/2 TABLET (10mg ) EVERY OTHER DAY (Patient not taking: Reported on 06/05/2023)   Not Taking   promethazine-dextromethorphan  (PROMETHAZINE-DM) 6.25-15 MG/5ML syrup Take 5 mLs by mouth every 6 (six) hours as needed for cough. (Patient not taking: Reported on 06/05/2023)   Not Taking   sertraline (ZOLOFT) 100 MG tablet TAKE 2 TABLETS BY MOUTH IN THE  MORNING AND 1 TABLET BY MOUTH IN THE EVENING 270 tablet 1 06/01/2023    No family history on file.   Review of Systems:  Patient mechanically ventilated and not responsive.  Unable to obtain ROS.  Physical Exam: BP (!) 141/60   Pulse (!) 104   Temp 99.7 F (37.6 C) (Axillary)   Resp (!) 30   Ht 6\' 1"  (1.854 m)   Wt 89.3 kg   SpO2 95%   BMI 25.97 kg/m    General appearance: ill-appearing 67yo male with trach mechanically ventilated. Tube feeding infusing via NG CorTrak.  Head: Normocephalic, without obvious abnormality, scabbed abrasion to right ear Neck: no adenopathy, no carotid bruit, no JVD Resp: coarse breath sounds, FiO2 at 0.4, sat 96% Cardio: RRR, monitor review shows PAF with VR 120's. Harsh systolic murmur loudest at left sternal border GI: soft, few bowel sounds, no obvious mass. Extremities: has various bruises over both upper and lower extremities. Distal pulses are palpable. Neurologic: opens eyes to voice but does not respond of follow examiner. Pupils equal and reactive to light. Moves upper extremities spontaneously but not to command.  Diagnostic Studies & Laboratory data:      Brief TEE Note   LVEF 60-65% Severe prolapse of the anterior mitral valve leaflet with likely associated leaflet perforation Severe mitral regurgitation  +PV flow reversal Moderate aortic regurgitation   For additional details see full report.   During this procedure the patient is administered a total of Versed 8 mg and Fentanyl 100 mcg to achieve and maintain moderate conscious sedation.  The patient's heart rate, blood pressure, and oxygen saturation are monitored continuously during the procedure. The period of conscious sedation is 25 minutes, of  which I was present face-to-face 100% of this time.   Tiffany C. Duke Salvia, MD, [facility] 06/09/2023 12:36 PM           Recent Radiology Findings:    US Abdomen Limited RUQ (LIVER/GB)  Result Date: 06/10/2023 CLINICAL DATA:  Fever EXAM: ULTRASOUND ABDOMEN LIMITED RIGHT UPPER QUADRANT COMPARISON:  X-ray 06/09/2023 FINDINGS: Gallbladder: Gallbladder not seen.  Please correlate with surgical history. Common bile duct: Diameter: 4 mm Liver: Mildly increased echogenic hepatic parenchyma consistent with a component of fatty infiltration. Portal vein is patent on color Doppler imaging with normal direction of blood flow towards the liver. Other: Limited by overlapping bowel gas and soft tissue. IMPRESSION: Slight fatty liver infiltration.  No ductal dilatation. Gallbladder not seen.  Please correlate for any history of surgery Electronically Signed   By: Karen Kays M.D.   On: 06/10/2023 19:53   DG Abd Portable 1V  Result Date: 06/09/2023 CLINICAL DATA:  Feeding tube placement. EXAM: PORTABLE ABDOMEN - 1 VIEW COMPARISON:  06/04/2023 FINDINGS: Tip of the weighted enteric tube is below the diaphragm in the region of the mid stomach. Generalized paucity of upper abdominal bowel gas. Right upper quadrant surgical clips. IMPRESSION: Tip of the weighted enteric tube below the diaphragm in the region of the mid stomach. Electronically Signed   By: Narda Rutherford M.D.   On: 06/09/2023 16:20   [place] EKG SITE RITE  Result Date: 06/09/2023 If Site Rite image not attached, placement could not be confirmed due to current cardiac rhythm.    I have independently reviewed the above radiologic studies and discussed with the patient   Recent Lab Findings: Lab Results  Component Value Date   WBC 7.8 06/11/2023   HGB 10.2 (L) 06/11/2023   HCT 33.0 (L) 06/11/2023   PLT 83 (L) 06/11/2023   GLUCOSE 154 (H) 06/11/2023   TRIG 124 06/07/2023   ALT 30 06/11/2023   AST 38 06/11/2023   NA 150 (H) 06/11/2023   K 3.7  06/11/2023  CL 110 06/11/2023   CREATININE 1.13 06/11/2023   BUN 46 (H) 06/11/2023   CO2 33 (H) 06/11/2023   TSH 0.725 12/09/2015   HGBA1C 5.1 12/09/2015      Assessment / Plan:      67 year old male with severe mitral insufficiency and severe aortic insufficiency presented with respiratory distress that has deteriorated to respiratory failure and sepsis.  He is ventilator dependent despite several days of aggressive diuresis and antibiotic therapy.  Morbidity and mortality risks for mitral valve repair in this setting are extremely high and thus surgery is not recommended.     I  spent 30 minutes reviewing the chart, available imaging and examining the patient.   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 Also associated with moderate to severe AI with normal LV function. Pt has been intubated for several weeks and now has tracheostomy. Has ongoing fevers and with new cultures from tracheostomy. His CXR with diffuse infiltrates and on modest ventilator settings that have improved somewhat with aggressive diuresis. Has maintained his renal function. On tube feeds however with an albumin of 1.9 and with overall generalized edema. He is sedated and not conversant.  I feel that currently not a surgical candidate. Has to become afrebile and hopefully able to be separated from ventilator. He has not been cathed nor have I been able to view a ct scan to evaluate ascending aorta.  I discussed the very poor prognosis if surgical intervention was taken with his wife and other family member. They have contact with [facility] center in the state and I feel that transfer should be allowed for them to feel that all the resources they want for this patient is to be available. As for now  he is not a candidate due to above.

## 2023-06-12 ENCOUNTER — Inpatient Hospital Stay (HOSPITAL_COMMUNITY): Payer: Medicare Other

## 2023-06-12 DIAGNOSIS — J9601 Acute respiratory failure with hypoxia: Secondary | ICD-10-CM | POA: Diagnosis not present

## 2023-06-12 DIAGNOSIS — G9341 Metabolic encephalopathy: Secondary | ICD-10-CM | POA: Diagnosis not present

## 2023-06-12 LAB — CBC
HCT: 34.6 % — ABNORMAL LOW (ref 39.0–52.0)
Hemoglobin: 10.5 g/dL — ABNORMAL LOW (ref 13.0–17.0)
MCH: 31.6 pg (ref 26.0–34.0)
MCHC: 30.3 g/dL (ref 30.0–36.0)
MCV: 104.2 fL — ABNORMAL HIGH (ref 80.0–100.0)
Platelets: 105 10*3/uL — ABNORMAL LOW (ref 150–400)
RBC: 3.32 MIL/uL — ABNORMAL LOW (ref 4.22–5.81)
RDW: 20.1 % — ABNORMAL HIGH (ref 11.5–15.5)
WBC: 7.5 10*3/uL (ref 4.0–10.5)
nRBC: 0 % (ref 0.0–0.2)

## 2023-06-12 LAB — CULTURE, RESPIRATORY W GRAM STAIN: Culture: NORMAL

## 2023-06-12 LAB — BASIC METABOLIC PANEL
Anion gap: 10 (ref 5–15)
BUN: 48 mg/dL — ABNORMAL HIGH (ref 8–23)
CO2: 33 mmol/L — ABNORMAL HIGH (ref 22–32)
Calcium: 8.1 mg/dL — ABNORMAL LOW (ref 8.9–10.3)
Chloride: 108 mmol/L (ref 98–111)
Creatinine, Ser: 1.23 mg/dL (ref 0.61–1.24)
GFR, Estimated: >60 mL/min (ref >60)
Glucose, Bld: 166 mg/dL — ABNORMAL HIGH (ref 70–99)
Potassium: 4.5 mmol/L (ref 3.5–5.1)
Sodium: 151 mmol/L — ABNORMAL HIGH (ref 135–145)

## 2023-06-12 LAB — MAGNESIUM: Magnesium: 2.9 mg/dL — ABNORMAL HIGH (ref 1.7–2.4)

## 2023-06-12 LAB — GLUCOSE, CAPILLARY
Glucose-Capillary: 117 mg/dL — ABNORMAL HIGH (ref 70–99)
Glucose-Capillary: 144 mg/dL — ABNORMAL HIGH (ref 70–99)
Glucose-Capillary: 146 mg/dL — ABNORMAL HIGH (ref 70–99)
Glucose-Capillary: 156 mg/dL — ABNORMAL HIGH (ref 70–99)
Glucose-Capillary: 160 mg/dL — ABNORMAL HIGH (ref 70–99)
Glucose-Capillary: 160 mg/dL — ABNORMAL HIGH (ref 70–99)

## 2023-06-12 LAB — C DIFFICILE (CDIFF) QUICK SCRN (NO PCR REFLEX)
C Diff antigen: NEGATIVE
C Diff interpretation: NOT DETECTED
C Diff toxin: NEGATIVE

## 2023-06-12 MED ORDER — ACETAMINOPHEN 325 MG PO TABS
650.0000 mg | ORAL_TABLET | Freq: Four times a day (QID) | ORAL | Status: DC | PRN
  Administered 2023-06-28: 650 mg
  Filled 2023-06-12: qty 2

## 2023-06-12 MED ORDER — WHITE PETROLATUM EX OINT
TOPICAL_OINTMENT | CUTANEOUS | Status: DC | PRN
  Filled 2023-06-12 (×2): qty 28.35

## 2023-06-12 MED ORDER — FUROSEMIDE 10 MG/ML IJ SOLN
60.0000 mg | Freq: Three times a day (TID) | INTRAMUSCULAR | Status: AC
  Administered 2023-06-12 – 2023-06-13 (×3): 60 mg via INTRAVENOUS
  Filled 2023-06-12 (×3): qty 6

## 2023-06-12 NOTE — Consult Note (Signed)
[facility]       Reason for Consult:fever    Referring Physician: Dr. Everardo All  Principal Problem:   Acute hypoxemic respiratory failure Joshua Eaton) Active Problems:   Severe mitral regurgitation   AKI (acute kidney injury) (HCC)   On mechanically assisted ventilation (HCC)   Acute pulmonary edema (HCC)   Tracheostomy dependence (HCC)   Moderate aortic insufficiency   Acute respiratory failure with hypoxia (HCC)    sodium chloride   Intravenous Once   Chlorhexidine Gluconate Cloth  6 each Topical Daily   clonazePAM  0.5 mg Per Tube BID   famotidine  20 mg Per Tube BID   feeding supplement (PROSource TF20)  60 mL Per Tube Daily   fiber supplement (BANATROL TF)  60 mL Per Tube BID   folic acid  1 mg Per Tube Daily   free water  200 mL Per Tube Q4H   furosemide  60 mg Intravenous Q8H   heparin injection (subcutaneous)  5,000 Units Subcutaneous Q8H   hydrALAZINE  25 mg Per Tube Q8H   insulin aspart  2 Units Subcutaneous Q4H   insulin aspart  2-6 Units Subcutaneous Q4H   multivitamin with minerals  1 tablet Per Tube Daily   mouth rinse  15 mL Mouth Rinse Q2H   oxyCODONE  10 mg Per Tube Q6H   sodium chloride flush  10-40 mL Intracatheter Q12H    Recommendations:  C diff testing (sent) Continue piptazo for 7 days  Assessment: He has a fever and seems to have improved with antibiotics for presumed pneumonia.  No changes indicated.  Some increased stool output but with tube feeds and K, less likely C diff, though testing sent.  MVR, AVI - two valves involved, no positive blood cultures from initial presentation, no other signs of infection at that time.  Not c/w infective endocarditis as a source.      HPI: Joshua Eaton is a 67 y.o. male with a history of colon cancer s/p right hemicolectomy in 2007, COPD, severe MVR and moderate AI who initially presented to [facility] on 9/19 with shortness of breath.  He was intubated at [facility] on 9/20 and echo on  9/19 with severe MR and moderate AI.  He was transferred to [facility] due to ongoing high vent requirements and concern for ARDS and remains in the ICU, now with a trach.  He has been seen by cardiology and TCTS and no intervention planned at this time due to the critical illness.  Plan to transfer to [facility] at some point.  He has been intermittently febrile with a fever of 101.5 the night of 10/10 and was started on piperacillin/tazobactam for presumed pneumonia.  Also with increase stool output but also on tube feeds, K replacement.  Fever curve trending down since addition of antibiotics.    Review of Systems:  Unable to be assessed due to patient factors  Past Medical History:  Diagnosis Date   Anxiety    Cancer (HCC)    Depression    Hypertension    OCD (obsessive compulsive disorder)     Social History   Tobacco Use   Smoking status: Never   Smokeless tobacco: Never  Substance Use Topics   Alcohol use: No   Drug use: No    No family history on file.  Allergies  Allergen Reactions   Codeine     "sends me into orbit"    Physical Exam: Constitutional: no response Vitals:  06/12/23 1119 06/12/23 1210  BP:    Pulse:  97  Resp:  16  Temp: 98.7 F (37.1 C)   SpO2:  99%   EYES: anicteric ENMT: + trach Respiratory: respiratory effort on vent GI: soft Musculoskeletal: no rash, no edema, no ulcers of feet  Lab Results  Component Value Date   WBC 7.5 06/12/2023   HGB 10.5 (L) 06/12/2023   HCT 34.6 (L) 06/12/2023   MCV 104.2 (H) 06/12/2023   PLT 105 (L) 06/12/2023    Lab Results  Component Value Date   CREATININE 1.23 06/12/2023   BUN 48 (H) 06/12/2023   NA 151 (H) 06/12/2023   K 4.5 06/12/2023   CL 108 06/12/2023   CO2 33 (H) 06/12/2023    Lab Results  Component Value Date   ALT 30 06/11/2023   AST 38 06/11/2023   ALKPHOS 49 06/11/2023     Microbiology: Recent Results (from the past 240 hour(s))  MRSA Next Gen by PCR, Nasal     Status: None   Collection  Time: 06/03/23  6:07 PM   Specimen: Nasal Mucosa; Nasal Swab  Result Value Ref Range Status   MRSA by PCR Next Gen NOT DETECTED NOT DETECTED Final    Comment: (NOTE) The GeneXpert MRSA Assay (FDA approved for NASAL specimens only), is one component of a comprehensive MRSA colonization surveillance program. It is not intended to diagnose MRSA infection nor to guide or monitor treatment for MRSA infections. Test performance is not FDA approved in patients less than 91 years old. Performed at [facility], [address], [place], [place] [address]   Culture, Respiratory w Gram Stain     Status: None   Collection Time: 06/04/23 10:03 AM   Specimen: Tracheal Aspirate; Respiratory  Result Value Ref Range Status   Specimen Description TRACHEAL ASPIRATE  Final   Special Requests NONE  Final   Gram Stain NO WBC SEEN NO ORGANISMS SEEN   Final   Culture   Final    NO GROWTH 2 DAYS Performed at [facility], [address], [place], [place] [address]    Report Status 06/07/2023 FINAL  Final  Culture, Respiratory w Gram Stain     Status: None   Collection Time: 06/06/23 12:00 PM   Specimen: Bronchoalveolar Lavage; Respiratory  Result Value Ref Range Status   Specimen Description BRONCHIAL ALVEOLAR LAVAGE  Final   Special Requests NONE  Final   Gram Stain   Final    RARE WBC PRESENT, PREDOMINANTLY PMN NO ORGANISMS SEEN    Culture   Final    NO GROWTH 2 DAYS Performed at [facility], [address], [place], [place] [address]    Report Status 06/08/2023 FINAL  Final  Culture, blood (Routine X 2) w Reflex to ID Panel     Status: None (Preliminary result)   Collection Time: 06/08/23 11:55 PM   Specimen: BLOOD LEFT ARM  Result Value Ref Range Status   Specimen Description BLOOD LEFT ARM  Final   Special Requests   Final    BOTTLES DRAWN AEROBIC AND ANAEROBIC Blood Culture adequate volume   Culture   Final    NO GROWTH 3 DAYS Performed at [facility],  [address], [place], [place] [address]    Report Status PENDING  Incomplete  Culture, blood (Routine X 2) w Reflex to ID Panel     Status: None (Preliminary result)   Collection Time: 06/08/23  11:58 PM   Specimen: BLOOD RIGHT ARM  Result Value Ref Range Status   Specimen Description BLOOD RIGHT ARM  Final   Special Requests   Final    BOTTLES DRAWN AEROBIC AND ANAEROBIC Blood Culture adequate volume   Culture   Final    NO GROWTH 3 DAYS Performed at [facility], [address] [place], [place] [address]    Report Status PENDING  Incomplete  Culture, Respiratory w Gram Stain     Status: None   Collection Time: 06/10/23 10:56 AM   Specimen: Tracheal Aspirate; Respiratory  Result Value Ref Range Status   Specimen Description TRACHEAL ASPIRATE  Final   Special Requests NONE  Final   Gram Stain   Final    ABUNDANT WBC PRESENT, PREDOMINANTLY PMN ABUNDANT GRAM POSITIVE COCCI IN PAIRS    Culture   Final    Normal respiratory flora-no Staph aureus or Pseudomonas seen Performed at [facility], [address], [place], [place] [address]    Report Status 06/12/2023 FINAL  Final    Gardiner Barefoot, MD [facility] [email or web address] 06/12/2023, 12:27 PM

## 2023-06-12 NOTE — Progress Notes (Signed)
NAME:  Joshua Eaton, MRN:  [id number], DOB:  07-13-1956, LOS: 9 ADMISSION DATE:  06/03/2023, CONSULTATION DATE:  06/02/2023 REFERRING MD:  Dr. Nadine Eaton [facility] , CHIEF COMPLAINT: Pneumonia  History of Present Illness:  This is a 67 year old male with a past medical history of colon cancer status post right hemicolectomy 2007, COPD, mitral valve Regurgitation who presented to [facility] on 05/20/2023 for shortness of breath. Prior to presenting, patient was feeling unwell for about 6 days with cough, shortness of breath. He had an initial CXR showing bilateral airspace opacities and was eventually placed on BiPAP and admitted to ICU. On 9/20 he was intubated due to ongoing respiratory failure and antibiotics were widened for multifocal pneumonia. He had echo performed showing EF 60-65%, severe dilated LA, severe MR. Throughout his course at [facility] had ongoing ARDS picture, developed worsening AKI. Eventually, [facility] PCCM team was consulted for transfer here due to ongoing high vent requirements, unable to wean. Additionally, there was concern over need for MV replacement and possible need for right heart cath.   Pertinent  Medical History  Colon cancer, status post hemicolectomy (right) 2007, questionable COPD wears CPAP at home, mitral valve regurgitation   Significant Hospital Events: Including procedures, antibiotic start and stop dates in addition to other pertinent events   Admitted to [facility] on 05/20/2023 06/03/2023: Transferred from [facility] to [facility] 10/5 failed SBT 10/06: Tracheostomy  06/09/2023: TEE performed at bedside 10/10: Mitral valve perforation on echo. Cardiology discussed limited options 10/11: CTS consulted  Interim History / Subjective:  GOC with family yesterday. Currently not surgical candidate. Agreed to continue current care Good UOP with diuresis Diarrhea unchanged Tolerating PS Objective   Blood pressure (!) 149/51, pulse 86, temperature 98.4  F (36.9 C), temperature source Axillary, resp. rate 19, height 6\' 1"  (1.854 m), weight 88.7 kg, SpO2 96%. Vent Mode: PSV;CPAP FiO2 (%):  [40 %] 40 % Set Rate:  [15 bmp] 15 bmp Vt Set:  [600 mL] 600 mL PEEP:  [5 cmH20] 5 cmH20 Pressure Support:  [10 cmH20-12 cmH20] 10 cmH20 Plateau Pressure:  [15 cmH20-23 cmH20] 23 cmH20   Intake/Output Summary (Last 24 hours) at 06/12/2023 1101 Last data filed at 06/12/2023 1000 Gross per 24 hour  Intake 2336.33 ml  Output 3400 ml  Net -1063.67 ml   Filed Weights   06/10/23 0111 06/11/23 0337 06/12/23 0116  Weight: 89.3 kg 89.3 kg 88.7 kg    Physical Exam: General: Chronically ill-appearing, no acute distress, difficult to arouse HENT: [place], AT, OP clear, MMM Neck: Trach in place, c/d/I Eyes: EOMI, no scleral icterus Respiratory: Diminished but clear to auscultation bilaterally.  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 Decreased lasix 60 mg q8h -Appreciate Cardiology involvement. Family has requested to minimize cardiology consults unless needed -Appreciate TCTS involvement. Not a candidate. Consider transfer to tertiary center if eligible -Hydralazine for afterload reduction   #Acute hypoxemic respiratory failure status post tracheostomy still requiring mechanical ventilation #Pulmonary edema secondary to HFpEF exacerbation #Severe mitral regurgitation #Aortic valve insufficiency #Multifocal pneumonia -Tolerating PS 10/11 and 10/12 -Full  vent support -LTVV, 4-8cc/kg IBW with goal  Pplat<30 and DP<15 -Diuresis -VAP -Continue Zosyn  Acute metabolic encephalopathy 2/2 critical illness -PAD protocol: klonopin, scheduled oxy and PRN fentanyl -Minimize sedating meds  #Fevers RUQ neg. Resp and blood cultures neg. Foley discontinued -Zosyn -ID consulted -Check C.diff (family with recent dx) -Check UE and LE [place]  #Macrocytic anemia #Thrombocytopenia Thrombocytopenia today at 83 today.  No signs of bleeding.  Will continue to monitor.  Hemoglobin 10.8 today. -Monitor CBC  #Nutrition -Continue tube feeds  #BPH Great urine output.  No concerns. -Continue on external urinary catheter  #Hyperbilirubinemia -Right upper quadrant ultrasound did not reveal a gallbladder, patient did have cholecystectomy per family  #Hypernatremia -Free water flushes  -Decreasing lasix  Best Practice (right click and "Reselect all SmartList Selections" daily)   Diet/type: tubefeeds DVT prophylaxis: Heparin GI prophylaxis: PPI Lines: Midline  Foley:  Yes, and it is still needed Code Status:  full code  06/11/23 Extensively discussed GOC with family including his sister, Joshua Eaton, and son, Joshua Eaton. They wish to remain aggressive with his care and give him every medical opportunity to improve. They understand that he is currently not a candidate for surgery for his mitral valve regurgitation but remain cautiously optimistic if we can improve his medical status. We discussed current barriers including encephalopathy, presumed recurrent respiratory infection +/- pulmonary edema secondary to heart failure. When patient is stable they would like to discuss potential transfer to hospital that would be willing to perform surgery.  Critical care time: 40 mins      The patient is critically ill with multiple organ systems failure and requires high complexity decision making for assessment and support, frequent evaluation and titration of therapies, application of advanced monitoring  technologies and extensive interpretation of multiple databases.  Independent Critical Care Time: 40 Minutes.   Mechele Collin, M.D. [facility] Pulmonary/Critical Care Medicine 06/12/2023 11:06 AM   Please see Amion for pager number to reach on-call Pulmonary and Critical Care Team.

## 2023-06-13 DIAGNOSIS — J9601 Acute respiratory failure with hypoxia: Secondary | ICD-10-CM | POA: Diagnosis not present

## 2023-06-13 LAB — COMPREHENSIVE METABOLIC PANEL
ALT: 25 U/L (ref 0–44)
AST: 31 U/L (ref 15–41)
Albumin: 1.9 g/dL — ABNORMAL LOW (ref 3.5–5.0)
Alkaline Phosphatase: 46 U/L (ref 38–126)
Anion gap: 11 (ref 5–15)
BUN: 51 mg/dL — ABNORMAL HIGH (ref 8–23)
CO2: 32 mmol/L (ref 22–32)
Calcium: 8 mg/dL — ABNORMAL LOW (ref 8.9–10.3)
Chloride: 106 mmol/L (ref 98–111)
Creatinine, Ser: 1.2 mg/dL (ref 0.61–1.24)
GFR, Estimated: >60 mL/min (ref >60)
Glucose, Bld: 174 mg/dL — ABNORMAL HIGH (ref 70–99)
Potassium: 4 mmol/L (ref 3.5–5.1)
Sodium: 149 mmol/L — ABNORMAL HIGH (ref 135–145)
Total Bilirubin: 1 mg/dL (ref 0.3–1.2)
Total Protein: 5.5 g/dL — ABNORMAL LOW (ref 6.5–8.1)

## 2023-06-13 LAB — MAGNESIUM: Magnesium: 3 mg/dL — ABNORMAL HIGH (ref 1.7–2.4)

## 2023-06-13 LAB — CBC
HCT: 33.7 % — ABNORMAL LOW (ref 39.0–52.0)
Hemoglobin: 10.1 g/dL — ABNORMAL LOW (ref 13.0–17.0)
MCH: 31.9 pg (ref 26.0–34.0)
MCHC: 30 g/dL (ref 30.0–36.0)
MCV: 106.3 fL — ABNORMAL HIGH (ref 80.0–100.0)
Platelets: 110 10*3/uL — ABNORMAL LOW (ref 150–400)
RBC: 3.17 MIL/uL — ABNORMAL LOW (ref 4.22–5.81)
RDW: 19.8 % — ABNORMAL HIGH (ref 11.5–15.5)
WBC: 7.1 10*3/uL (ref 4.0–10.5)
nRBC: 0 % (ref 0.0–0.2)

## 2023-06-13 LAB — GLUCOSE, CAPILLARY
Glucose-Capillary: 132 mg/dL — ABNORMAL HIGH (ref 70–99)
Glucose-Capillary: 142 mg/dL — ABNORMAL HIGH (ref 70–99)
Glucose-Capillary: 149 mg/dL — ABNORMAL HIGH (ref 70–99)
Glucose-Capillary: 155 mg/dL — ABNORMAL HIGH (ref 70–99)
Glucose-Capillary: 158 mg/dL — ABNORMAL HIGH (ref 70–99)
Glucose-Capillary: 205 mg/dL — ABNORMAL HIGH (ref 70–99)

## 2023-06-13 LAB — LIPASE, BLOOD: Lipase: 43 U/L (ref 11–51)

## 2023-06-13 LAB — AMYLASE: Amylase: 108 U/L — ABNORMAL HIGH (ref 28–100)

## 2023-06-13 MED ORDER — CLONAZEPAM 0.25 MG PO TBDP
0.2500 mg | ORAL_TABLET | Freq: Two times a day (BID) | ORAL | Status: DC
  Administered 2023-06-13 (×2): 0.25 mg
  Filled 2023-06-13 (×2): qty 1

## 2023-06-13 MED ORDER — POTASSIUM CHLORIDE 20 MEQ PO PACK
80.0000 meq | PACK | Freq: Two times a day (BID) | ORAL | Status: DC
  Administered 2023-06-13 (×2): 80 meq
  Filled 2023-06-13 (×2): qty 4

## 2023-06-13 MED ORDER — OXYCODONE HCL 5 MG/5ML PO SOLN
5.0000 mg | Freq: Four times a day (QID) | ORAL | Status: DC
  Administered 2023-06-13 – 2023-06-18 (×20): 5 mg
  Filled 2023-06-13 (×20): qty 5

## 2023-06-13 MED ORDER — FUROSEMIDE 10 MG/ML IJ SOLN
60.0000 mg | Freq: Three times a day (TID) | INTRAMUSCULAR | Status: AC
  Administered 2023-06-13 – 2023-06-14 (×3): 60 mg via INTRAVENOUS
  Filled 2023-06-13 (×3): qty 6

## 2023-06-13 NOTE — Progress Notes (Signed)
NAME:  Joshua Eaton, MRN:  [id number], DOB:  [birth year], LOS: 10 ADMISSION DATE:  06/03/2023, CONSULTATION DATE:  06/02/2023 REFERRING MD:  Dr. Nadine Counts [facility] , CHIEF COMPLAINT: Pneumonia  BRIEF  This is a 67 year old male with a past medical history of colon cancer status post right hemicolectomy 2007, COPD, mitral valve Regurgitation who presented to [facility] on 05/20/2023 for shortness of breath. Prior to presenting, patient was feeling unwell for about 6 days with cough, shortness of breath. He had an initial CXR showing bilateral airspace opacities and was eventually placed on BiPAP and admitted to ICU. On 9/20 he was intubated due to ongoing respiratory failure and antibiotics were widened for multifocal pneumonia. He had echo performed showing EF 60-65%, severe dilated LA, severe MR. Throughout his course at [facility] had ongoing ARDS picture, developed worsening AKI. Eventually, [facility] PCCM team was consulted for transfer here due to ongoing high vent requirements, unable to wean. Additionally, there was concern over need for MV replacement and possible need for right heart cath.   Pertinent  Medical History  Colon cancer, status post hemicolectomy (right) 2007, questionable COPD wears CPAP at home, mitral valve regurgitation   Significant Hospital Events: Including procedures, antibiotic start and stop dates in addition to other pertinent events   Admitted to [facility] health on 05/20/2023 06/03/2023: Transferred from [facility] to [facility] MRSA PCR negative 06/04/2023 Tracheal aspirate no growth 10/5 failed SBT 10/06: Tracheostomy  06/08/2023 Blood culture no growth as of 06/13/2023  06/09/2023: TEE performed at bedside 10/10: Mitral valve perforation on echo. Cardiology discussed limited options Tracheal aspirate normal for 10/11: CTS consulted 10/12: GOC with family yesterday. Currently not surgical candidate. Agreed to continue current care, Good UOP with diuresis,  Diarrhea unchanged, Tolerating PS C. difficile screen negative   Interim History / Subjective:   10/13: Remains on the ventilator FiO2 40%.  Is on tube feeds on free water.  Is on Zosyn.  Is afebrile.  White count is normal. RN says he has been unresponsive x 2 days at least but did notice some foot movements - > per pharm: he is on scheduled oxy and klonopin.   Objective   Blood pressure 125/88, pulse (!) 118, temperature 100 F (37.8 C), temperature source Oral, resp. rate (!) 23, height 6\' 1"  (1.854 m), weight 86.9 kg, SpO2 95%. Vent Mode: PRVC FiO2 (%):  [40 %] 40 % Set Rate:  [15 bmp] 15 bmp Vt Set:  [550 mL] 550 mL PEEP:  [5 cmH20] 5 cmH20 Pressure Support:  [8 cmH20-10 cmH20] 8 cmH20 Plateau Pressure:  [14 cmH20-17 cmH20] 14 cmH20   Intake/Output Summary (Last 24 hours) at 06/13/2023 0835 Last data filed at 06/13/2023 0600 Gross per 24 hour  Intake 2012.68 ml  Output 2570 ml  Net -557.32 ml   Filed Weights   06/11/23 0337 06/12/23 0116 06/13/23 0500  Weight: 89.3 kg 88.7 kg 86.9 kg    Physical Exam: General Appearance:  Looks criticall ill and chronic critically ill Head:  Normocephalic, without obvious abnormality, atraumatic Eyes:  PERRL - yes, conjunctiva/corneas - muddy     Ears:  Normal external ear canals, both ears Nose:  G tube - yes Throat:  ETT TUBE - no , OG tube - no. TRACH + Neck:  Supple,  No enlargement/tenderness/nodules Lungs: Clear to auscultation bilaterally, Ventilator   Synchrony - YEs Heart:  S1 and S2 normal, no murmur, CVP - no.  Pressors - no Abdomen:  Soft, no masses, no  organomegaly Genitalia / Rectal:  Not done Extremities:  Extremities- intact Skin:  ntact in exposed areas . Sacral area - no decub per  RN Neurologic:  Sedation - scheudled oxy and klonopin -> RASS - -4 . Moves all 4s - no but some feet movement noted. CAM-ICU - cannot assess . 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  Continue  lasix 60 mg q8h -Appreciate Cardiology involvement. Family has requested to minimize cardiology consults unless needed -Appreciate TCTS involvement. Not a candidate. Consider transfer to tertiary center if eligible -Hydralazine for afterload reduction   #Acute hypoxemic respiratory failure status post tracheostomy still requiring mechanical ventilation #Pulmonary edema secondary to HFpEF exacerbation #Severe mitral regurgitation #Aortic valve insufficiency #Multifocal pneumonia  10/13:  On PSV 10/5 but mental status and overall critical illness precludes further liberation   PLAN -Tolerating PS 10/11 and 10/12 an d 10/13 -Full vent support -LTVV, 4-8cc/kg IBW with goal Pplat<30 and DP<15 -Diuresis -. Check bNP 06/14/23 -VAP   Acute metabolic encephalopathy 2/2 critical illness -PAD protocol: klonopin, scheduled oxy and PRN fentanyl  10/13: RASS -4  plan -REDUCE klonopin and oxycodone byt 50%  - monitor  #Fevers - RUQ neg. Resp and blood cultures neg. Foley discontinued. Concern aspiration. ID consult. C Diff negative  PLAN -Zosyn x  7 days through 06/16/23 -Check UE and LE [place]  ordered 10/12 (still pending 06/13/23)  #Macrocytic anemia #Thrombocytopenia   10/13: stable/improvined  Plan -Monitor CBC - - PRBC for hgb </= 6.9gm%    - exceptions are   -  if ACS susepcted/confirmed then transfuse for hgb </= 8.0gm%,  or    -  active bleeding with hemodynamic instability, then transfuse regardless of hemoglobin value   At at all times try to transfuse 1 unit prbc as possible with exception of active hemorrhage   #Nutrition -Continue tube feeds  #BPH Great urine output.  No concerns. -Continue on external urinary  catheter  #Hyperbilirubinemia -Right upper quadrant ultrasound did not reveal a gallbladder, patient did have cholecystectomy per family  #Hypernatremia  10/13: improved  plan -Free water flushes  -Decreasing lasix  Best Practice (right click and "Reselect all SmartList Selections" daily)   Diet/type: tubefeeds DVT prophylaxis: Heparin GI prophylaxis: PPI Lines: Midline  Foley:  Yes, and it is still needed Code Status:  full code  06/11/23 Extensively discussed GOC with family including his sister, [name], and son, [name]. They wish to remain aggressive with his care and give him every medical opportunity to improve. They understand that he is currently not a candidate for surgery for his mitral valve regurgitation but remain cautiously optimistic if we can improve his medical status. We discussed current barriers including encephalopathy, presumed recurrent respiratory infection +/- pulmonary edema secondary to heart failure. When patient is stable they would like to discuss potential transfer to hospital that would be willing to perform surgery.  06/13/23 : wife [name] Ferner: [phone]: denied any questions. She will be at bedside later around lunch time   ATTESTATION & SIGNATURE   The patient Susano Cleckler is critically ill with multiple organ systems failure and requires high complexity decision making for assessment and support, frequent evaluation and titration of therapies, application of advanced monitoring  technologies and extensive interpretation of multiple databases and discussion with other appropriate health care personnel such as bedside nurses, social workers, case Production designer, theatre/television/film, consultants, respiratory therapists, nutritionists, secretaries etc.,  Critical care time includes but is not restricted to just documentation time. Documentation can happen in parallel or sequential to care time depending on case mix urgency and priorities for the shift. So, overall critical Care Time  devoted to patient care services described in this note is  30  Minutes.   This time reflects time of care of this signee Dr Kalman Shan which includ does not reflect procedure time, or teaching time or supervisory time of PA/NP/Med student/Med Resident etc but could involve care discussion time     Dr. Kalman Shan, M.D., [facility]C.P Pulmonary and Critical Care Medicine Staff Physician,  [facility] Pager: [phone], If no answer or between  15:00h - 7:00h: call [phone]  06/13/2023 8:35 AM   LABS    PULMONARY No results for input(s): "PHART", "PCO2ART", "PO2ART", "HCO3", "TCO2", "O2SAT" in the last 168 hours.  Invalid input(s): "PCO2", "PO2"  CBC Recent Labs  Lab 06/11/23 0335 06/12/23 0404 06/13/23 0328  HGB 10.2* 10.5* 10.1*  HCT 33.0* 34.6* 33.7*  WBC 7.8 7.5 7.1  PLT 83* 105* 110*    COAGULATION No results for input(s): "INR" in the last 168 hours.  CARDIAC  No results for input(s): "TROPONINI" in the last 168 hours. No results for input(s): "PROBNP" in the last 168 hours.   CHEMISTRY Recent Labs  Lab 06/07/23 0305 06/08/23 0354 06/09/23 0829 06/10/23 0434 06/11/23 0335 06/12/23 0404 06/13/23 0328  NA 143 143 141 147* 150* 151* 149*  K 3.4* 4.0 3.4* 3.3* 3.7 4.5 4.0  CL 103 108 102 106 110 108 106  CO2 28 27 28 31  33* 33* 32  GLUCOSE 149* 157* 121* 137* 154* 166* 174*  BUN 38* 43* 52* 55* 46* 48* 51*  CREATININE 0.75 0.99 0.92 1.17 1.13 1.23 1.20  CALCIUM 8.2* 7.6* 7.9* 8.1* 7.9* 8.1* 8.0*  MG 2.7* 2.4 2.5*  --  2.6* 2.9* 3.0*  PHOS 2.1*  --  4.4  --  3.0  --   --    Estimated Creatinine Clearance: 65.7 mL/min (by C-G formula based on SCr of 1.2 mg/dL).   LIVER Recent Labs  Lab 06/07/23 0305 06/09/23 0829 06/11/23 0335 06/13/23 0328  AST 28 30 38 31  ALT 15 18 30 25   ALKPHOS 52 48 49 46  BILITOT 1.6* 1.5* 1.3* 1.0  PROT 4.8* 5.0* 5.2* 5.5*  ALBUMIN 1.8* 1.8* 1.9* 1.9*      INFECTIOUS No results for input(s): "LATICACIDVEN", "PROCALCITON" in the last 168 hours.   ENDOCRINE CBG (last 3)  Recent Labs    06/12/23 2332 06/13/23 0350 06/13/23 0749  GLUCAP 160* 205* 158*         IMAGING x48h  - image(s) personally visualized  -   highlighted in bold DG CHEST PORT 1 VIEW  Result Date: 06/12/2023 CLINICAL DATA:  [id number] Hypoxia [id number] EXAM: PORTABLE CHEST 1 VIEW COMPARISON:  CXR 06/09/23 FINDINGS: Tracheostomy in place. Weighted enteric tube courses below diaphragm with tip positioned near the pylorus. Tracheostomy in place. Right arm PICC with the tip in the lower SVC. No pleural effusion. No pneumothorax. Cardiomegaly. Persistent prominent bilateral interstitial opacities that could represent pulmonary venous congestion or multifocal infection. No radiographically apparent displaced rib fractures. Visualized upper abdomen is unremarkable IMPRESSION: 1. Interval  placement of a right arm PICC with the tip positioned in the lower SVC. Additional support apparatus as above. 2. Otherwise no significant change from prior exam with persistent prominent bilateral interstitial opacities. Electronically Signed   By: Lorenza Cambridge M.D.   On: 06/12/2023 11:15

## 2023-06-13 NOTE — Progress Notes (Signed)
Brief ID note:  C diff testing sent and negative so no indication for treatment.  Seems improved from febrile standpoint on piptazo so plan to continue for a 7 day course for pneumonia.   Awaiting potential [facility] if [facility].   Will sign off  Gardiner Barefoot, MD

## 2023-06-13 NOTE — Plan of Care (Signed)
Problem: Elimination: Goal: Will not experience complications related to urinary retention Outcome: Progressing   Problem: Pain Managment: Goal: General experience of comfort will improve Outcome: Progressing   Problem: Safety: Goal: Ability to remain free from injury will improve Outcome: Progressing

## 2023-06-14 ENCOUNTER — Encounter (HOSPITAL_COMMUNITY): Payer: Medicare Other

## 2023-06-14 DIAGNOSIS — J9601 Acute respiratory failure with hypoxia: Secondary | ICD-10-CM | POA: Diagnosis not present

## 2023-06-14 LAB — COMPREHENSIVE METABOLIC PANEL
ALT: 24 U/L (ref 0–44)
AST: 28 U/L (ref 15–41)
Albumin: 2 g/dL — ABNORMAL LOW (ref 3.5–5.0)
Alkaline Phosphatase: 45 U/L (ref 38–126)
Anion gap: 9 (ref 5–15)
BUN: 50 mg/dL — ABNORMAL HIGH (ref 8–23)
CO2: 33 mmol/L — ABNORMAL HIGH (ref 22–32)
Calcium: 8.2 mg/dL — ABNORMAL LOW (ref 8.9–10.3)
Chloride: 110 mmol/L (ref 98–111)
Creatinine, Ser: 1.24 mg/dL (ref 0.61–1.24)
GFR, Estimated: >60 mL/min (ref >60)
Glucose, Bld: 160 mg/dL — ABNORMAL HIGH (ref 70–99)
Potassium: 4.1 mmol/L (ref 3.5–5.1)
Sodium: 152 mmol/L — ABNORMAL HIGH (ref 135–145)
Total Bilirubin: 1.1 mg/dL (ref 0.3–1.2)
Total Protein: 5.9 g/dL — ABNORMAL LOW (ref 6.5–8.1)

## 2023-06-14 LAB — GLUCOSE, CAPILLARY
Glucose-Capillary: 134 mg/dL — ABNORMAL HIGH (ref 70–99)
Glucose-Capillary: 156 mg/dL — ABNORMAL HIGH (ref 70–99)
Glucose-Capillary: 161 mg/dL — ABNORMAL HIGH (ref 70–99)
Glucose-Capillary: 167 mg/dL — ABNORMAL HIGH (ref 70–99)
Glucose-Capillary: 175 mg/dL — ABNORMAL HIGH (ref 70–99)
Glucose-Capillary: 165 mg/dL — ABNORMAL HIGH (ref 70–99)

## 2023-06-14 LAB — CULTURE, BLOOD (ROUTINE X 2)
Culture: NO GROWTH
Culture: NO GROWTH
Special Requests: ADEQUATE
Special Requests: ADEQUATE

## 2023-06-14 LAB — CBC
HCT: 34.3 % — ABNORMAL LOW (ref 39.0–52.0)
Hemoglobin: 10.3 g/dL — ABNORMAL LOW (ref 13.0–17.0)
MCH: 32 pg (ref 26.0–34.0)
MCHC: 30 g/dL (ref 30.0–36.0)
MCV: 106.5 fL — ABNORMAL HIGH (ref 80.0–100.0)
Platelets: 140 10*3/uL — ABNORMAL LOW (ref 150–400)
RBC: 3.22 MIL/uL — ABNORMAL LOW (ref 4.22–5.81)
RDW: 19.8 % — ABNORMAL HIGH (ref 11.5–15.5)
WBC: 7.4 10*3/uL (ref 4.0–10.5)
nRBC: 0.3 % — ABNORMAL HIGH (ref 0.0–0.2)

## 2023-06-14 LAB — BRAIN NATRIURETIC PEPTIDE: B Natriuretic Peptide: 160.5 pg/mL — ABNORMAL HIGH (ref 0.0–100.0)

## 2023-06-14 LAB — PHOSPHORUS: Phosphorus: 2.6 mg/dL (ref 2.5–4.6)

## 2023-06-14 LAB — MAGNESIUM: Magnesium: 3 mg/dL — ABNORMAL HIGH (ref 1.7–2.4)

## 2023-06-14 MED ORDER — FREE WATER
100.0000 mL | Status: DC
  Administered 2023-06-14 – 2023-06-16 (×49): 100 mL

## 2023-06-14 MED ORDER — OXYCODONE HCL 5 MG PO TABS
5.0000 mg | ORAL_TABLET | Freq: Four times a day (QID) | ORAL | Status: DC | PRN
  Administered 2023-06-17: 5 mg
  Filled 2023-06-14: qty 1

## 2023-06-14 MED ORDER — HYDRALAZINE HCL 50 MG PO TABS
50.0000 mg | ORAL_TABLET | Freq: Three times a day (TID) | ORAL | Status: DC
  Administered 2023-06-14 – 2023-06-22 (×22): 50 mg
  Filled 2023-06-14 (×23): qty 1

## 2023-06-14 MED ORDER — GERHARDT'S BUTT CREAM
TOPICAL_CREAM | Freq: Two times a day (BID) | CUTANEOUS | Status: DC
  Administered 2023-06-23: 1 via TOPICAL
  Filled 2023-06-14 (×4): qty 1

## 2023-06-14 NOTE — Consult Note (Signed)
WOC Nurse Consult Note: Reason for Consult: Consult requested for sacrum and rectum area.  Pt is critically ill with multiple systemic factors which can impair healing.  Sacrum with Stage 2 pressure injury; 1X1X.1cm, pink and moist Lower rectum is red moist and macerated with partial thickness skin loss; Flexiseal in place to attempt to contain liquid stool but some still leaks around the insertion area.  Partial thickness skin loss is 3X2X.1cm related to moisture and where the tubing has been positioned.  Discussed plan of care with family member at the bedside. Pt is on a low airloss mattress to reduce pressure.  Topical treatment orders provided for bedside nurses to perform as follows to repel moisture and promote healing: 1. Apply Gerhardts cream to rectum area BID and PRN with each turning and cleaning episode 2. Attempt to position Flexiseal tubing upwards and away from affected area on lower rectum to avoid pressure 3. Foam dresisng to bilat buttocks/sacrum, change Q 3 days or PRN soiling. Please re-consult if further assistance is needed.  Thank-you,  Cammie Mcgee MSN, RN, CWOCN, [place], CNS [phone]

## 2023-06-14 NOTE — Progress Notes (Signed)
NAME:  Joshua Eaton, MRN:  [id number], DOB:  14-Apr-1956, LOS: 11 ADMISSION DATE:  06/03/2023, CONSULTATION DATE:  06/02/2023 REFERRING MD:  Dr. Nadine Counts [facility] , CHIEF COMPLAINT: Pneumonia  BRIEF  This is a 67 year old male with a past medical history of colon cancer status post right hemicolectomy 2007, COPD, mitral valve Regurgitation who presented to [facility] health on 05/20/2023 for shortness of breath. Prior to presenting, patient was feeling unwell for about 6 days with cough, shortness of breath. He had an initial CXR showing bilateral airspace opacities and was eventually placed on BiPAP and admitted to ICU. On 9/20 he was intubated due to ongoing respiratory failure and antibiotics were widened for multifocal pneumonia. He had echo performed showing EF 60-65%, severe dilated LA, severe MR. Throughout his course at [facility] had ongoing ARDS picture, developed worsening AKI. Eventually, [facility] PCCM team was consulted for transfer here due to ongoing high vent requirements, unable to wean. Additionally, there was concern over need for MV replacement and possible need for right heart cath.   Pertinent  Medical History  Colon cancer, status post hemicolectomy (right) 2007, questionable COPD wears CPAP at home, mitral valve regurgitation   Significant Hospital Events: Including procedures, antibiotic start and stop dates in addition to other pertinent events   Admitted to [facility] health on 05/20/2023 06/03/2023: Transferred from [place] [facility] to [facility] MRSA PCR negative 06/04/2023 Tracheal aspirate no growth 10/5 failed SBT 10/06: Tracheostomy  06/08/2023 Blood culture no growth as of 06/13/2023  06/09/2023: TEE performed at bedside 10/10: Mitral valve perforation on echo. Cardiology discussed limited options Tracheal aspirate normal for 10/11: CTS consulted 10/12: GOC with family yesterday. Currently not surgical candidate. Agreed to continue current care, Good UOP with diuresis,  Diarrhea unchanged, Tolerating PS C. difficile screen negative   Interim History / Subjective:   Remains on ventilator stable O2.  Afebrile on Zosyn.  Long discussion with sister at bedside regarding concerns.  Discussed ongoing plan of care both day to date and long-term big picture.  Objective   Blood pressure (!) 140/62, pulse (!) 114, temperature 99.4 F (37.4 C), temperature source Axillary, resp. rate (!) 26, height 6\' 1"  (1.854 m), weight 86.9 kg, SpO2 95%. Vent Mode: PRVC FiO2 (%):  [40 %] 40 % Set Rate:  [15 bmp] 15 bmp Vt Set:  [550 mL] 550 mL PEEP:  [5 cmH20] 5 cmH20 Pressure Support:  [12 cmH20] 12 cmH20 Plateau Pressure:  [13 cmH20-16 cmH20] 16 cmH20   Intake/Output Summary (Last 24 hours) at 06/14/2023 1414 Last data filed at 06/14/2023 1024 Gross per 24 hour  Intake 2166.59 ml  Output 1750 ml  Net 416.59 ml   Filed Weights   06/11/23 0337 06/12/23 0116 06/13/23 0500  Weight: 89.3 kg 88.7 kg 86.9 kg    Physical Exam: General Appearance:  Looks criticall ill and chronic critically ill Head:  Normocephalic, without obvious abnormality, atraumatic Eyes:  PERRL - yes, conjunctiva/corneas - muddy     Ears:  Normal external ear canals, both ears Nose:  G tube - yes Throat:  ETT TUBE - no , OG tube - no. TRACH + Neck:  Supple,  No enlargement/tenderness/nodules Lungs: Clear to auscultation bilaterally, breathing over 70 moderate Heart:  S1 and S2 normal, no murmur, CVP - no.  Pressors - no Abdomen:  Soft, no masses, no organomegaly Genitalia / Rectal:  Not done Extremities:  Extremities- intact Skin:  ntact in exposed areas .  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 Family has requested to minimize cardiology consults unless needed -Appreciate TCTS involvement. Not a candidate. Consider transfer to tertiary center if stabilizes -Hydralazine for afterload reduction, increase 10/14 with elevated BPs   #Acute hypoxemic respiratory failure status post tracheostomy still requiring mechanical ventilation #Pulmonary edema secondary to HFpEF exacerbation #Severe mitral regurgitation #Aortic valve insufficiency #Multifocal pneumonia On PSV 10/5 but mental status and overall critical illness precludes further liberation  PLAN -Tolerating PS  -Full vent support -LTVV, 4-8cc/kg IBW with goal Pplat<30 and DP<15 -Diuresis holiday, BNP 10/14 markedly improved from prior -VAP bundle, stress ulcer prophylaxis  Acute metabolic encephalopathy 2/2 critical illness plan -Stop Klonopin, continue oxycodone given signs of pain on exam -CT head ordered 10/14  #Fevers - RUQ neg. Resp culture with OP flora.  Blood cultures neg. Foley discontinued. Concern aspiration. ID consult. C Diff negative.  Improved with initiation of Zosyn for presumed aspiration pneumonia. -Zosyn x  7 days through 06/16/23 -Check UE and LE [place]  ordered 10/12 (still not performed)  #Macrocytic anemia #Thrombocytopenia -- Transfuse if hemoglobin 7 or less  # Severe protein calorie malnutrition  -Continue tube feeds  #BPH Great urine output.  No concerns. -Continue on external urinary catheter  #Hyperbilirubinemia -Resolved  #Hypernatremia: Worsening -Increase free water flush -Lasix holiday  Best Practice (right click and "Reselect all SmartList Selections" daily)   Diet/type: tubefeeds DVT prophylaxis: Heparin GI prophylaxis: PPI Lines: PICC needed Foley:  Yes, and it is still  needed Code Status:  full code  06/14/23 Extensively discussed goals of care and plan of care with sister at bedside,  will attempt update wife as well    LABS    PULMONARY No results for input(s): "PHART", "PCO2ART", "PO2ART", "HCO3", "TCO2", "O2SAT" in the last 168 hours.  Invalid input(s): "PCO2", "PO2"  CBC Recent Labs  Lab 06/12/23 0404 06/13/23 0328 06/14/23 0553  HGB 10.5* 10.1* 10.3*  HCT 34.6* 33.7* 34.3*  WBC 7.5 7.1 7.4  PLT 105* 110* 140*    COAGULATION No results for input(s): "INR" in the last 168 hours.  CARDIAC  No results for input(s): "TROPONINI" in the last 168 hours. No results for input(s): "PROBNP" in the last 168 hours.   CHEMISTRY Recent Labs  Lab 06/09/23 0829 06/10/23 0434 06/11/23 0335 06/12/23 0404 06/13/23 0328 06/14/23 0553  NA 141 147* 150* 151* 149* 152*  K 3.4* 3.3* 3.7 4.5 4.0 4.1  CL 102 106 110 108 106 110  CO2 28 31 33* 33* 32 33*  GLUCOSE 121* 137* 154* 166* 174* 160*  BUN 52* 55* 46* 48* 51* 50*  CREATININE 0.92 1.17 1.13 1.23 1.20 1.24  CALCIUM 7.9* 8.1* 7.9* 8.1* 8.0* 8.2*  MG 2.5*  --  2.6* 2.9* 3.0* 3.0*  PHOS 4.4  --  3.0  --   --  2.6   Estimated Creatinine Clearance: 63.5 mL/min (by C-G formula based on SCr of 1.24 mg/dL).   LIVER Recent Labs  Lab 06/09/23 0829 06/11/23 0335 06/13/23 0328 06/14/23 0553  AST 30 38 31 28  ALT 18 30 25 24   ALKPHOS 48 49 46 45  BILITOT 1.5* 1.3* 1.0 1.1  PROT 5.0* 5.2* 5.5* 5.9*  ALBUMIN 1.8* 1.9* 1.9* 2.0*     INFECTIOUS No results for input(s): "LATICACIDVEN", "PROCALCITON" in the last 168 hours.   ENDOCRINE CBG (last 3)  Recent Labs    06/14/23 0358 06/14/23 0736 06/14/23 1129  GLUCAP 134* 156* 161*         IMAGING x48h  - image(s) personally visualized  -   highlighted in bold No results found.  CRITICAL CARE Performed by: Lesia Sago Arrayah Connors   Total critical care time: 37 minutes  Critical care time was exclusive of separately billable  procedures and treating other patients.  Critical care was necessary to treat or prevent imminent or life-threatening deterioration.  Critical care was time spent personally by me on the following activities: development of treatment plan with patient and/or surrogate as well as nursing, discussions with consultants, evaluation of patient's response to treatment, examination of patient, obtaining history from patient or surrogate, ordering and performing treatments and interventions, ordering and review of laboratory studies, ordering and review of radiographic studies, pulse oximetry and re-evaluation of patient's condition.  Karren Burly, MD See Loretha Stapler

## 2023-06-14 NOTE — Progress Notes (Signed)
SLP Cancellation Note  Patient Details Name: Joshua Eaton MRN: [id number] DOB: [birth year]   Cancelled treatment:       Reason Eval/Treat Not Completed: Patient not medically ready. SLP continues to check for potential to use PMV. Will continue to follow.     Mahala Menghini., M.A. CCC-SLP Acute Rehabilitation Services Office [phone]  Secure chat preferred  06/14/2023, 7:35 AM

## 2023-06-14 NOTE — Progress Notes (Signed)
Physical Therapy Treatment Patient Details Name: Joshua Eaton MRN: [id number] DOB: 21-Jul-1956 Today's Date: 06/14/2023   History of Present Illness Pt is 67 yo male admitted to [facility] 9/19 with SOB, intubated 9/20. Pt with continued ARDS and AKI, 10/3 transfer to [facility]. 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 PMHx: colon CA s/p Rt hemicolectomy, MVR, COPD    PT Comments  Pt remains on trach to vent and not able to follow any commands.  He keeps eyes closed through majority of session and does not respond to stimuli.  Assessed PROM of all joints and no major contracture noted.  Bil UE with edema so elevated.  Ankle DF to neutral and pt is in prevelon boots.  Neck ROM is limited -pt tolerating gentle PROM.  Will continue plan of care -checking on pt weekly to assess for changes/ability to participate.  If pt with change - please notify. Recommendation for LTACH at d/c.    If plan is discharge home, recommend the following: Two people to help with walking and/or transfers;Two people to help with bathing/dressing/bathroom;Assistance with cooking/housework;Assist for transportation   Can travel by Doctor, hospital (measurements PT);Wheelchair cushion (measurements PT);Hoyer lift;Hospital bed    Recommendations for Other Services       Precautions / Restrictions Precautions Precautions: Fall;Other (comment) Precaution Comments: vent, trach, UB edema     Mobility  Bed Mobility Overal bed mobility: Needs Assistance             General bed mobility comments: Pt in chair position with wedge under right side to offload.  After session position with pillows to support head, elevate arms/hand for edema, and feet in prevelon boots.  Total A of 2 for any bed mobility    Transfers                   General transfer comment: unable    Ambulation/Gait                    Stairs             Wheelchair Mobility     Tilt Bed    Modified Rankin (Stroke Patients Only)       Balance                                            Cognition Arousal: Stuporous   Overall Cognitive Status: Difficult to assess                                 General Comments: Pt with trach on vent;  Mostly keeping eyes closed but did open for 3-4 brief periods periodically; Not turning head or moving eyes toward stimuli; Followed no commands        Exercises Other Exercises Other Exercises: PROM 10 reps bil: shoulder flex, shoulder abd, elbow flex/ext, finger and wrist flex/ext.  Pt tolerated well except grimace with shoudler end range/did not push further.  Noted pt spontaneously moves R UE but not L UE.  Assessed bil scapula with good PROM.  No contracutres noted. Other Exercises: PROM 10 reps bil: ankle DF/PF, heel slide, hip IR/ER.  Pt tolerated well.  He is in bil prevalon boots for pressure  relief and ROM, able to get to neutral dorsiflexion. Performed heel cord stretch 30 x sec x 3 Other Exercises: Gentle neck PROM 10 reps: R/L rotation, R/L lateral flex, lifting head from pillow; Pt does have limited neck ROM, does cough with neck ROM.    General Comments        Pertinent Vitals/Pain Pain Assessment Pain Assessment: Faces Faces Pain Scale: Hurts a little bit Pain Location: Grimaced end range shoulder ROM Pain Descriptors / Indicators: Grimacing Pain Intervention(s): Repositioned    Home Living                          Prior Function            PT Goals (current goals can now be found in the care plan section) Progress towards PT goals: Not progressing toward goals - comment (still unable to follow commands)    Frequency    Min 1X/week      PT Plan      Co-evaluation              AM-PAC PT "6 Clicks" Mobility   Outcome Measure  Help needed turning from your back to  your side while in a flat bed without using bedrails?: Total Help needed moving from lying on your back to sitting on the side of a flat bed without using bedrails?: Total Help needed moving to and from a bed to a chair (including a wheelchair)?: Total Help needed standing up from a chair using your arms (e.g., wheelchair or bedside chair)?: Total Help needed to walk in hospital room?: Total Help needed climbing 3-5 steps with a railing? : Total 6 Click Score: 6    End of Session   Activity Tolerance: Treatment limited secondary to medical complications (Comment);Other (comment) (limited by LOC) Patient left: in bed;with bed alarm set (prevelon boots, arms elevated, wedge under R side to offload) Nurse Communication: Mobility status;Need for lift equipment PT Visit Diagnosis: Other abnormalities of gait and mobility (R26.89);Muscle weakness (generalized) (M62.81);Other symptoms and signs involving the nervous system (R29.898)     Time: 5284-1324 PT Time Calculation (min) (ACUTE ONLY): 20 min  Charges:    $Therapeutic Exercise: 8-22 mins PT General Charges $$ ACUTE PT VISIT: 1 Visit                     Anise Salvo, PT Acute Rehab [facility] [phone]    Rayetta Humphrey 06/14/2023, 3:29 PM

## 2023-06-15 ENCOUNTER — Inpatient Hospital Stay (HOSPITAL_COMMUNITY): Payer: Medicare Other

## 2023-06-15 DIAGNOSIS — J9601 Acute respiratory failure with hypoxia: Secondary | ICD-10-CM | POA: Diagnosis not present

## 2023-06-15 LAB — BASIC METABOLIC PANEL
Anion gap: 9 (ref 5–15)
Anion gap: 11 (ref 5–15)
BUN: 45 mg/dL — ABNORMAL HIGH (ref 8–23)
BUN: 49 mg/dL — ABNORMAL HIGH (ref 8–23)
CO2: 32 mmol/L (ref 22–32)
CO2: 31 mmol/L (ref 22–32)
Calcium: 8.5 mg/dL — ABNORMAL LOW (ref 8.9–10.3)
Calcium: 8.3 mg/dL — ABNORMAL LOW (ref 8.9–10.3)
Chloride: 106 mmol/L (ref 98–111)
Chloride: 106 mmol/L (ref 98–111)
Creatinine, Ser: 1.22 mg/dL (ref 0.61–1.24)
Creatinine, Ser: 1.21 mg/dL (ref 0.61–1.24)
GFR, Estimated: >60 mL/min (ref >60)
GFR, Estimated: >60 mL/min (ref >60)
Glucose, Bld: 191 mg/dL — ABNORMAL HIGH (ref 70–99)
Glucose, Bld: 156 mg/dL — ABNORMAL HIGH (ref 70–99)
Potassium: 3.4 mmol/L — ABNORMAL LOW (ref 3.5–5.1)
Potassium: 4.2 mmol/L (ref 3.5–5.1)
Sodium: 147 mmol/L — ABNORMAL HIGH (ref 135–145)
Sodium: 148 mmol/L — ABNORMAL HIGH (ref 135–145)

## 2023-06-15 LAB — CBC
HCT: 34.6 % — ABNORMAL LOW (ref 39.0–52.0)
Hemoglobin: 10.3 g/dL — ABNORMAL LOW (ref 13.0–17.0)
MCH: 31.1 pg (ref 26.0–34.0)
MCHC: 29.8 g/dL — ABNORMAL LOW (ref 30.0–36.0)
MCV: 104.5 fL — ABNORMAL HIGH (ref 80.0–100.0)
Platelets: 158 10*3/uL (ref 150–400)
RBC: 3.31 MIL/uL — ABNORMAL LOW (ref 4.22–5.81)
RDW: 19.9 % — ABNORMAL HIGH (ref 11.5–15.5)
WBC: 8.7 10*3/uL (ref 4.0–10.5)
nRBC: 0.2 % (ref 0.0–0.2)

## 2023-06-15 LAB — GLUCOSE, CAPILLARY
Glucose-Capillary: 144 mg/dL — ABNORMAL HIGH (ref 70–99)
Glucose-Capillary: 184 mg/dL — ABNORMAL HIGH (ref 70–99)
Glucose-Capillary: 122 mg/dL — ABNORMAL HIGH (ref 70–99)
Glucose-Capillary: 129 mg/dL — ABNORMAL HIGH (ref 70–99)
Glucose-Capillary: 164 mg/dL — ABNORMAL HIGH (ref 70–99)
Glucose-Capillary: 141 mg/dL — ABNORMAL HIGH (ref 70–99)

## 2023-06-15 LAB — MAGNESIUM: Magnesium: 3 mg/dL — ABNORMAL HIGH (ref 1.7–2.4)

## 2023-06-15 MED ORDER — POTASSIUM CHLORIDE 20 MEQ PO PACK
40.0000 meq | PACK | Freq: Once | ORAL | Status: AC
  Administered 2023-06-15: 40 meq
  Filled 2023-06-15: qty 2

## 2023-06-15 MED ORDER — VITAL 1.5 CAL PO LIQD
1000.0000 mL | ORAL | Status: DC
  Administered 2023-06-15 – 2023-06-21 (×7): 1000 mL
  Filled 2023-06-15 (×2): qty 1000

## 2023-06-15 MED ORDER — ACETAMINOPHEN 500 MG PO TABS
1000.0000 mg | ORAL_TABLET | Freq: Three times a day (TID) | ORAL | Status: DC
  Administered 2023-06-15 – 2023-06-28 (×37): 1000 mg
  Filled 2023-06-15 (×38): qty 2

## 2023-06-15 MED ORDER — FUROSEMIDE 10 MG/ML IJ SOLN
80.0000 mg | Freq: Four times a day (QID) | INTRAMUSCULAR | Status: AC
  Administered 2023-06-15 (×2): 80 mg via INTRAVENOUS
  Filled 2023-06-15 (×2): qty 8

## 2023-06-15 MED ORDER — POTASSIUM CHLORIDE 20 MEQ PO PACK: 40.0000 meq | PACK | Freq: Once | ORAL | Status: DC

## 2023-06-15 MED ORDER — JUVEN PO PACK
1.0000 | PACK | Freq: Two times a day (BID) | ORAL | Status: DC
  Administered 2023-06-15 – 2023-06-28 (×23): 1
  Filled 2023-06-15 (×23): qty 1

## 2023-06-15 MED ORDER — BANATROL TF EN LIQD
60.0000 mL | Freq: Three times a day (TID) | ENTERAL | Status: DC
  Administered 2023-06-15 – 2023-06-21 (×16): 60 mL
  Filled 2023-06-15 (×16): qty 60

## 2023-06-15 NOTE — Consult Note (Addendum)
WOC Nurse re-consult Note: Reason for Consult: Requested to come and discuss wounds to rectum and sacrum with family members at the bedside. WOC consult was performed yesterday; refer to previous consult note for assessment and plan of care.  WOC discussed this with sister at bedside yesterday, but today 2 other family members are present and also wanted to discuss the wounds. I reviewed the topical treatment that is being performed, the air mattress to reduce pressure, systemic factors that can impair healing when patients are critically ill, and the size and appearance of each location. They deny further questions at this time.  Please re-consult if further assistance is needed.  Thank-you,  Cammie Mcgee MSN, RN, CWOCN, [place], CNS [phone]

## 2023-06-15 NOTE — Progress Notes (Signed)
Pt. transported from 2M06 to CT 4 and back on 100% O2 with RT and nurse without any complications. Vital signs stable at this time.

## 2023-06-15 NOTE — Progress Notes (Signed)
NAME:  Joshua Eaton, MRN:  [id number], DOB:  Jan 22, 1956, LOS: 12 ADMISSION DATE:  06/03/2023, CONSULTATION DATE:  06/02/2023 REFERRING MD:  Dr. Nadine Counts [facility] , CHIEF COMPLAINT: Pneumonia  BRIEF  This is a 67 year old male with a past medical history of colon cancer status post right hemicolectomy 2007, COPD, mitral valve Regurgitation who presented to [facility] health on 05/20/2023 for shortness of breath. Prior to presenting, patient was feeling unwell for about 6 days with cough, shortness of breath. He had an initial CXR showing bilateral airspace opacities and was eventually placed on BiPAP and admitted to ICU. On 9/20 he was intubated due to ongoing respiratory failure and antibiotics were widened for multifocal pneumonia. He had echo performed showing EF 60-65%, severe dilated LA, severe MR. Throughout his course at [facility] had ongoing ARDS picture, developed worsening AKI. Eventually, [facility] PCC[facility] team was consulted for transfer here due to ongoing high vent requirements, unable to wean. Additionally, there was concern over need for MV replacement and possible need for right heart cath.   Pertinent  Medical History  Colon cancer, status post hemicolectomy (right) 2007, questionable COPD wears CPAP at home, mitral valve regurgitation   Significant Hospital Events: Including procedures, antibiotic start and stop dates in addition to other pertinent events   Admitted to [facility] health on 05/20/2023 06/03/2023: Transferred from [facility] to [facility] MRSA PCR negative 06/04/2023 Tracheal aspirate no growth 10/5 failed SBT 10/06: Tracheostomy  06/08/2023 Blood culture no growth as of 06/13/2023  06/09/2023: TEE performed at bedside 10/10: Mitral valve perforation on echo. Cardiology discussed limited options Tracheal aspirate normal for 10/11: CTS consulted 10/12: GOC with family yesterday. Currently not surgical candidate. Agreed to continue current care, Good UOP with diuresis,  Diarrhea unchanged, Tolerating PS C. difficile screen negative   Interim History / Subjective:   Remains on ventilator stable O2.  Afebrile on Zosyn.  CT head without bleed or catastrophe to explain encephalopathy. CT chest with mixed picture pulmonary edema, small bilateral effusions, scattered basilar infiltrates concerning for aspiration pneumonia/pneumonitis.  Sodium coming down with increased free water flush.  He is a bit more active, semipurposeful.  Does not follow commands for me.  Objective   Blood pressure 111/61, pulse (!) 113, temperature 98.3 F (36.8 C), temperature source Axillary, resp. rate (!) 25, height 6\' 1"  (1.854 m), weight 86.9 kg, SpO2 97%. Vent Mode: PRVC FiO2 (%):  [40 %] 40 % Set Rate:  [15 bmp] 15 bmp Vt Set:  [550 mL] 550 mL PEEP:  [5 cmH20] 5 cmH20 Plateau Pressure:  [12 cmH20-16 cmH20] 12 cmH20   Intake/Output Summary (Last 24 hours) at 06/15/2023 1236 Last data filed at 06/15/2023 0700 Gross per 24 hour  Intake 1922.67 ml  Output 1300 ml  Net 622.67 ml   Filed Weights   06/11/23 0337 06/12/23 0116 06/13/23 0500  Weight: 89.3 kg 88.7 kg 86.9 kg    Physical Exam: General Appearance:  Looks chronically critically ill Head:  Normocephalic, without obvious abnormality, atraumatic Eyes:  PERRL - yes, conjunctiva/corneas - muddy     Ears:  Normal external ear canals, both ears Nose:  G tube - yes Throat:  ETT TUBE - no , OG tube - no. TRACH + Neck:  Supple,  No enlargement/tenderness/nodules Lungs: Clear to auscultation bilaterally, breathing over ventilator Heart:  S1 and S2 normal, no murmur, CVP - no.  Pressors - no Abdomen:  Soft, no masses, no organomegaly Genitalia / Rectal:  Not done Extremities:  Extremities-  intact Skin:  ntact in exposed areas .  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 Family has requested to minimize cardiology consults unless needed -Appreciate TCTS involvement. Not a candidate. Consider reevaluation versus transfer to tertiary center if stabilizes -Hydralazine for afterload reduction, increased 10/14 with elevated BPs   #Acute hypoxemic respiratory failure status post tracheostomy still requiring mechanical ventilation #Pulmonary edema secondary to HFpEF exacerbation #Severe mitral regurgitation #Aortic valve insufficiency #Multifocal pneumonia PLAN -PRVC, tolerating PSV trials -Full vent support -Resume diuresis 10/15 -VAP bundle, stress ulcer prophylaxis -Continue Zosyn for aspiration pneumonia  Acute metabolic encephalopathy 2/2 critical illness plan -Stop Klonopin 10/14, continue oxycodone given signs of pain on exam -CT head ordered 10/14 without significant findings to explain encephalopathy  #Fevers - RUQ neg. Resp culture with OP flora.  Blood cultures neg. Foley discontinued. Concern aspiration. ID consult. C Diff negative.  Improved with initiation of Zosyn for presumed aspiration pneumonia. -Zosyn x  7 days through 06/16/23 -Check UE and LE [place]  ordered 10/12 (still not performed)  #Macrocytic anemia #Thrombocytopenia -- Transfuse if hemoglobin 7 or less  #Severe protein calorie malnutrition  -Continue tube feeds  #BPH Great urine output.  No concerns. -Continue on external urinary catheter  #Hyperbilirubinemia -Resolved  #Hypernatremia: Improved with increase free water flushes -Increase free water flush 10/14 -Resume Lasix  Best Practice (right click and "Reselect all SmartList Selections" daily)   Diet/type: tubefeeds DVT prophylaxis: Heparin GI prophylaxis: PPI Lines:  PICC needed Foley:  Yes, and it is still needed Code Status:  full code  06/15/23 Extensively discussed goals of care and plan of care with wife at bedside, sister on the phone    LABS    PULMONARY No results for input(s): "PHART", "PCO2ART", "PO2ART", "HCO3", "TCO2", "O2SAT" in the last 168 hours.  Invalid input(s): "PCO2", "PO2"  CBC Recent Labs  Lab 06/13/23 0328 06/14/23 0553 06/15/23 0506  HGB 10.1* 10.3* 10.3*  HCT 33.7* 34.3* 34.6*  WBC 7.1 7.4 8.7  PLT 110* 140* 158    COAGULATION No results for input(s): "INR" in the last 168 hours.  CARDIAC  No results for input(s): "TROPONINI" in the last 168 hours. No results for input(s): "PROBNP" in the last 168 hours.   CHEMISTRY Recent Labs  Lab 06/09/23 0829 06/10/23 0434 06/11/23 0335 06/12/23 0404 06/13/23 0328 06/14/23 0553 06/15/23 0506  NA 141   < > 150* 151* 149* 152* 147*  K 3.4*   < > 3.7 4.5 4.0 4.1 3.4*  CL 102   < > 110 108 106 110 106  CO2 28   < > 33* 33* 32 33* 32  GLUCOSE 121*   < > 154* 166* 174* 160* 191*  BUN 52*   < > 46* 48* 51* 50* 45*  CREATININE 0.92   < > 1.13 1.23 1.20 1.24 1.22  CALCIUM 7.9*   < > 7.9* 8.1* 8.0* 8.2* 8.5*  MG 2.5*  --  2.6* 2.9* 3.0* 3.0* 3.0*  PHOS 4.4  --  3.0  --   --  2.6  --    < > = values in this interval  not displayed.   Estimated Creatinine Clearance: 64.6 mL/min (by C-G formula based on SCr of 1.22 mg/dL).   LIVER Recent Labs  Lab 06/09/23 0829 06/11/23 0335 06/13/23 0328 06/14/23 0553  AST 30 38 31 28  ALT 18 30 25 24   ALKPHOS 48 49 46 45  BILITOT 1.5* 1.3* 1.0 1.1  PROT 5.0* 5.2* 5.5* 5.9*  ALBUMIN 1.8* 1.9* 1.9* 2.0*     INFECTIOUS No results for input(s): "LATICACIDVEN", "PROCALCITON" in the last 168 hours.   ENDOCRINE CBG (last 3)  Recent Labs    06/15/23 0327 06/15/23 0752 06/15/23 1158  GLUCAP 144* 184* 122*         IMAGING x48h  - image(s) personally visualized  -   highlighted in bold CT HEAD WO CONTRAST  ( )  Result Date: 06/15/2023 CLINICAL DATA:  Inpatient with encephalopathy. EXAM: CT HEAD WITHOUT CONTRAST TECHNIQUE: Contiguous axial images were obtained from the base of the skull through the vertex without intravenous contrast. RADIATION DOSE REDUCTION: This exam was performed according to the departmental dose-optimization program which includes automated exposure control, adjustment of the mA and/or kV according to patient size and/or use of iterative reconstruction technique. COMPARISON:  Head CT 05/21/2023 FINDINGS: Brain: There is mild cerebral atrophy and small-vessel disease, unremarkable cerebellum and brainstem. No asymmetry is seen concerning for a cortical based acute infarct, hemorrhage, mass or mass effect. There is no midline shift. The ventricles are normal in size and position. Basal cisterns are patent. Vascular: There are patchy calcifications in both carotid siphons. There are no hyperdense central vessels. Skull: Negative for fractures or focal lesions. Sinuses/Orbits: Clear paranasal sinuses. Negative visible orbits. Interval nasoenteric intubation. There is new patchy fluid in the left mastoid air cells, trace fluid in the right mastoid tip also new. Both middle ear cavities are clear. Other: None. IMPRESSION: 1. No acute intracranial CT findings or intracranial interval changes. 2. New patchy fluid in the left mastoid air cells and trace fluid in the right mastoid tip. 3. Interval nasoenteric intubation. Electronically Signed   By: Almira Bar M.D.   On: 06/15/2023 07:23   CT CHEST WO CONTRAST  Result Date: 06/15/2023 CLINICAL DATA:  Pulmonary venous congestion versus multifocal infection on chest x-rays. EXAM: CT CHEST WITHOUT CONTRAST TECHNIQUE: Multidetector CT imaging of the chest was performed following the standard protocol without IV contrast. RADIATION DOSE REDUCTION: This exam was performed according to the departmental dose-optimization program which includes  automated exposure control, adjustment of the mA and/or kV according to patient size and/or use of iterative reconstruction technique. COMPARISON:  Portable chest 06/12/2023, portable chest 06/09/2023, chest CT without contrast 05/31/2023, CTA chest 05/20/2023. FINDINGS: Cardiovascular: There is mild cardiomegaly with a left chamber predominance. Scattered two-vessel coronary calcific plaques in the circumflex and LAD. There is no substantial pericardial effusion. Scattered calcific plaques noted in the aorta and great vessels but no aortic aneurysm. Mild aortic tortuosity. Central pulmonary veins are slightly prominent. Pulmonary arteries are normal caliber. A right PICC terminates in the distal SVC. Mediastinum/Nodes: Assessment for hilar adenopathy is difficult without contrast but there is no contour deforming abnormality. Tracheostomy cannula terminates 2 cm above the carina. The trachea is clear below the cannula but above the cannula there are moderate layering secretions in the distal cervical trachea. There is a feeding tube in the esophagus with the tip in the distal gastric antrum. No thyroid nodule is seen, no intrathoracic, axillary or supraclavicular adenopathy. Lungs/Pleura: There are minimal layering pleural effusions. There is  subpleural septal thickening in the lung fields consistent with interstitial edema, improved compared with 05/31/2023. There is confluent consolidation with air bronchograms in the bilateral lower lobes primarily the basilar segments which is probably due to pneumonia or aspiration. This is similar to the prior study. Patchy hazy airspace disease is noted in the bilateral upper and right middle lobes where the prior CT demonstrated patchy dense airspace disease. This is probably either ground-glass edema or ground-glass pneumonitis. Upper Abdomen: No acute abnormality. No splenomegaly with AP splenic dimension 16.5 cm. Musculoskeletal: No chest wall mass or suspicious bone  lesions identified. IMPRESSION: 1. Cardiomegaly with slight prominence of the central pulmonary veins and minimal layering pleural effusions. 2. Interstitial edema in the lung fields, improved compared with 05/31/2023. 3. Confluent consolidation with air bronchograms in the bilateral lower lobes primarily the basilar segments, probably due to pneumonia or aspiration. This is similar to the prior CT. 4. Patchy hazy opacities in the bilateral upper and right middle lobes where the prior CT demonstrated denser airspace disease. This is probably either ground-glass edema or ground-glass pneumonitis. 5. Aortic and coronary artery atherosclerosis. 6. Tracheostomy cannula and feeding tube. 7. Moderate layering secretions in the distal cervical trachea above the tracheostomy. 8. Splenomegaly. Electronically Signed   By: Almira Bar M.D.   On: 06/15/2023 07:14    CRITICAL CARE Performed by: Lesia Sago Natasia Sanko   Total critical care time: 42 minutes  Critical care time was exclusive of separately billable procedures and treating other patients.  Critical care was necessary to treat or prevent imminent or life-threatening deterioration.  Critical care was time spent personally by me on the following activities: development of treatment plan with patient and/or surrogate as well as nursing, discussions with consultants, evaluation of patient's response to treatment, examination of patient, obtaining history from patient or surrogate, ordering and performing treatments and interventions, ordering and review of laboratory studies, ordering and review of radiographic studies, pulse oximetry and re-evaluation of patient's condition.  Karren Burly, MD See Loretha Stapler

## 2023-06-15 NOTE — Plan of Care (Signed)
°  Problem: Activity: Goal: Risk for activity intolerance will decrease Outcome: Progressing   Problem: Nutrition: Goal: Adequate nutrition will be maintained Outcome: Progressing   Problem: Coping: Goal: Level of anxiety will decrease Outcome: Progressing   

## 2023-06-15 NOTE — Progress Notes (Signed)
Nutrition Follow-up  DOCUMENTATION CODES:  Not applicable  INTERVENTION:  Continue TF at goal: Vital 1.5 at 65 ml/h (1560 ml per day) Prosource TF20 60 ml 1x/d Free water q1h Provides 2420 kcal, 125 gm protein, 1192 ml free water daily ( TF+flush) Increase Banatrol to TID to aid in management of loose stools 1 packet Juven BID, each packet provides 95 calories, 2.5 grams of protein (collagen), and 9.8 grams of carbohydrate (3 grams sugar); also contains 7 grams of L-arginine and L-glutamine, 300 mg vitamin C, 15 mg vitamin E, 1.2 mcg vitamin B-12, 9.5 mg zinc, 200 mg calcium, and 1.5 g  Calcium Beta-hydroxy-Beta-methylbutyrate to support wound healing  NUTRITION DIAGNOSIS:  Inadequate oral intake related to acute illness as evidenced by NPO status. - remains applicable  GOAL:  Patient will meet greater than or equal to 90% of their needs - progressing, being met with TF at goal  MONITOR:  Vent status, Labs, Weight trends, TF tolerance  REASON FOR ASSESSMENT:  Consult Enteral/tube feeding initiation and management  ASSESSMENT:  Pt admitted with PNA and respiratory failure. PMH significant for colon cancer s/p R hemicolectomy (2007), COPD, MVR.  9/20: intubated at [facility] 10/3: transferred to [facility] d/t high vent requirements, unable to wean, and concern for need for MVR and R heart cath 10/6 - bedside trach placed, bronchoscopy 10/9 - TEE, small bore tube pulled during procedure, cortrak placed  Pt resting in bed at the time of assessment. Family at bedside and RN discussing updates. TF infusing at goal of 34mL/H.  Noted that pt has been aggressively diuresed and is now -11.7L this admission. Now receiving of free water qh via tube per MD. Na and BUN improving. Also note that pt ahs developed wounds. Likely due to immobility and pressor needs this admission, but will increase TF rate slightly and aff juven to promote wound healing.  MV: 15.8  L/min Temp (24hrs), Avg:98.9 F (37.2 C), Min:98.3 F (36.8 C), Max:100 F (37.8 C)  Admit weight: 101.9 kg Current weight: 86.9 kg   Intake/Output Summary (Last 24 hours) at 06/15/2023 1418 Last data filed at 06/15/2023 1200 Gross per 24 hour  Intake 2302.89 ml  Output 1335 ml  Net 967.89 ml  Net IO Since Admission: -11,736.59 mL [06/15/23 1418]  Nutritionally Relevant Medications: Scheduled Meds:  famotidine  20 mg Per Tube BID   feeding supplement (PROSource TF20)  60 mL Per Tube Daily   fiber supplement (BANATROL TF)  60 mL Per Tube BID   folic acid  1 mg Per Tube Daily   free water  100 mL Per Tube Q1H   furosemide  80 mg Intravenous Q6H   insulin aspart  2 Units Subcutaneous Q4H   insulin aspart  2-6 Units Subcutaneous Q4H   multivitamin with minerals  1 tablet Per Tube Daily   Continuous Infusions:  feeding supplement (VITAL 1.5 CAL) 60 mL/hr at 06/15/23 1200   piperacillin-tazobactam (ZOSYN)  IV 3.375 g (06/15/23 1207)   PRN Meds: loperamide HCl, polyethylene glycol  Labs Reviewed: Na 147 K 3.4 BUN 45 Mg 3.0 CBG ranges from 110-160 mg/dL over the last 24 hours  NUTRITION - FOCUSED PHYSICAL EXAM: Flowsheet Row Most Recent Value  Orbital Region Unable to assess  Upper Arm Region Mild depletion  Thoracic and Lumbar Region No depletion  Buccal Region Unable to assess  Temple Region Severe depletion  Clavicle Bone Region Severe depletion  Clavicle and Acromion Bone Region Moderate depletion  Scapular Bone  Region Unable to assess  Dorsal Hand Unable to assess  [mod edema]  Patellar Region Unable to assess  [RLE edema]  Anterior Thigh Region Severe depletion  Posterior Calf Region Severe depletion  Edema (RD Assessment) Moderate  [moderate BLE]  Hair Reviewed  Eyes Reviewed  Mouth Unable to assess  Skin Reviewed  Nails Reviewed   Diet Order:   Diet Order             Diet NPO time specified  Diet effective midnight                    EDUCATION NEEDS:  No education needs have been identified at this time  Skin:  Skin Assessment: Reviewed RN Assessment Stage 2: - sacrum (1 x 1 x 0.1 cm) - perineum (3 x 2 x 0.1 cm)   Last BM:  10/15 - type 7 Fecal Management System in place, with output in the last 24 hours.  Height:  Ht Readings from Last 1 Encounters:  06/14/23 6\' 1"  (1.854 m)    Weight:  Wt Readings from Last 1 Encounters:  06/13/23 86.9 kg    Ideal Body Weight:  83.6 kg  BMI:  Body mass index is 25.28 kg/m.  Estimated Nutritional Needs:  Kcal:  2100-2300 Protein:  105-120g Fluid:  >/=2L    Greig Castilla, RD, LDN Clinical Dietitian RD pager # available in AMION  After hours/weekend pager # available in [facility]

## 2023-06-15 NOTE — Evaluation (Signed)
Occupational Therapy Evaluation Patient Details Name: Joshua Eaton MRN: [id number] DOB: [birth year] Today's Date: 06/15/2023   History of Present Illness Pt is 67 yo male admitted to [facility] 9/19 with SOB, intubated 9/20. Pt with continued ARDS and AKI, 10/3 transfer to [facility]. 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 PMHx: colon CA s/p Rt hemicolectomy, MVR, COPD   Clinical Impression   Unsure of Pt's PLOF as family not present (although understand that they are very supportive and have been in hospital a lot) Pt on trach to vent and not able to follow any commands. His eyes were more open today compared to previous PT notes and while he did respond to sound and blink to threat he did not track. Assessed PROM of all joints and no major contracture noted. Bil UE with edema so elevated. Ankle DF to neutral and pt is in prevelon boots. Neck ROM is limited -pt tolerating gentle PROM. Pt with noted spontaneous movement of BUE/BLE but non-purposeful and strength seemed improved proximal>distal. Pt will benefit from resting hand splints - but not risk of contracture at wrist or digits presently. At this time OT will follow acutely and plan for post-acute placement for the medically complex . OT will plan on checking on pt weekly to assess for changes/ability to participate. If pt with change - please notify.  Of note: OT attempted to verify direction following multiple ways. Pt will blink once - but not twice, he did not close mouth, squeeze hand, attempt to say words. OT will continue to work with team to determine if Pt has purposeful reactions/responses.        If plan is discharge home, recommend the following: Two people to help with walking and/or transfers;Two people to help with bathing/dressing/bathroom    Functional Status Assessment  Patient has had a recent decline in their functional status and/or demonstrates  limited ability to make significant improvements in function in a reasonable and predictable amount of time  Equipment Recommendations  Other (comment) (defer to next venue of care)    Recommendations for Other Services Other (comment);PT consult;Speech consult (Palliative Consult)     Precautions / Restrictions Precautions Precautions: Fall;Other (comment) Precaution Comments: vent, trach, UB edema Restrictions Weight Bearing Restrictions: No      Mobility Bed Mobility Overal bed mobility: Needs Assistance             General bed mobility comments: Pt in chair position with pillows to support head, elevate arms/hand for edema, and feet in prevelon boots.  Total A of 2 for any bed mobility    Transfers                   General transfer comment: unable      Balance                                           ADL either performed or assessed with clinical judgement   ADL Overall ADL's : Needs assistance/impaired                                       General ADL Comments: dependent and NPO     Vision   Vision Assessment?: Vision impaired- to be further tested in functional context Additional Comments:  Pt does open eyes and responds to sounds - unable to determine eye contact vs "staring through" does blink to threat but does not track; better response on the Left side than the right     Perception         Praxis         Pertinent Vitals/Pain Pain Assessment Pain Assessment: Faces Faces Pain Scale: Hurts a little bit Pain Location: Grimaced end range shoulder ROM Pain Descriptors / Indicators: Grimacing Pain Intervention(s): Monitored during session, Repositioned (elevation of BUE)     Extremity/Trunk Assessment Upper Extremity Assessment Upper Extremity Assessment: RUE deficits/detail;LUE deficits/detail RUE Deficits / Details: PROM remains WFL, moving spontaneously but non-purposefully against gravity at elbow  and some shoulder, no wrist or digit movement - need to assess for resting hand splints RUE Coordination: decreased fine motor;decreased gross motor LUE Deficits / Details: significant edema, proximal strength better than distal - spontaneous non-purposeful movement at elbow>shoulder, none at wrist or digits today LUE Coordination: decreased fine motor;decreased gross motor   Lower Extremity Assessment Lower Extremity Assessment: Defer to PT evaluation       Communication Communication Communication: Tracheostomy (did not attempt to say words at all during session)   Cognition Arousal: Stuporous   Overall Cognitive Status: Difficult to assess                                 General Comments: Pt with trach on vent; eyes opened to stimuli, seemily makes eye contact and does blink to threat, but does not track and potentially could be "staring without seeing" did not follow any commands for squeeze/look this way/blink twice (did blink once)/close mouth - will need to continue to assess     General Comments       Exercises Exercises: General Upper Extremity, General Lower Extremity General Exercises - Upper Extremity Shoulder Flexion: PROM, Both, Seated, 10 reps Shoulder Horizontal ABduction: PROM, Both, Seated, 10 reps Elbow Flexion: PROM, Both, Seated, 10 reps Elbow Extension: PROM, 10 reps, Both, Seated Wrist Flexion: PROM, Both, 10 reps, Seated Wrist Extension: PROM, Both, 10 reps, Seated Digit Composite Flexion: PROM, Both, 10 reps, Seated General Exercises - Lower Extremity Ankle Circles/Pumps: PROM, Both, 10 reps, Seated Short Arc Quad: PROM, Both, Seated, 10 reps Heel Slides: PROM, Both, 10 reps, Seated Hip ABduction/ADduction: PROM, Both, Seated, 10 reps   Shoulder Instructions      Home Living Family/patient expects to be discharged to:: Unsure                                 Additional Comments: pt unable  to provide home setup or PLOF  and no family present      Prior Functioning/Environment Prior Level of Function : Patient poor historian/Family not available                        OT Problem List: Decreased activity tolerance;Impaired balance (sitting and/or standing);Impaired vision/perception;Decreased cognition;Decreased coordination;Impaired UE functional use;Increased edema      OT Treatment/Interventions: Self-care/ADL training;Therapeutic exercise;DME and/or AE instruction;Manual therapy;Splinting;Therapeutic activities;Cognitive remediation/compensation;Visual/perceptual remediation/compensation;Patient/family education;Balance training    OT Goals(Current goals can be found in the care plan section) Acute Rehab OT Goals Patient Stated Goal: none stated OT Goal Formulation: Patient unable to participate in goal setting Time For Goal Achievement: 06/29/23 Potential to Achieve Goals: Fair ADL  Goals Pt Will Perform Grooming: with max assist Pt/caregiver will Perform Home Exercise Program: Both right and left upper extremity;With written HEP provided  OT Frequency: Min 1X/week    Co-evaluation              AM-PAC OT "6 Clicks" Daily Activity     Outcome Measure Help from another person eating meals?: Total Help from another person taking care of personal grooming?: Total Help from another person toileting, which includes using toliet, bedpan, or urinal?: Total Help from another person bathing (including washing, rinsing, drying)?: Total Help from another person to put on and taking off regular upper body clothing?: Total Help from another person to put on and taking off regular lower body clothing?: Total 6 Click Score: 6   End of Session Equipment Utilized During Treatment: Oxygen (trach/vent support, prevlon boots) Nurse Communication: Mobility status;Precautions  Activity Tolerance: Patient tolerated treatment well Patient left: in bed;with call bell/phone within reach;with bed alarm  set;with SCD's reapplied (in more of a chair position)  OT Visit Diagnosis: Muscle weakness (generalized) (M62.81);Other symptoms and signs involving the nervous system (R29.898);Other symptoms and signs involving cognitive function                Time: 7829-5621 OT Time Calculation (min): 20 min Charges:  OT General Charges $OT Visit: 1 Visit OT Evaluation $OT Eval High Complexity: 1 High Nyoka Cowden OTR/L Acute Rehabilitation Services Office: [phone]  Evern Bio [facility] 06/15/2023, 1:34 PM

## 2023-06-16 ENCOUNTER — Inpatient Hospital Stay (HOSPITAL_COMMUNITY): Payer: Medicare Other

## 2023-06-16 DIAGNOSIS — J9601 Acute respiratory failure with hypoxia: Secondary | ICD-10-CM | POA: Diagnosis not present

## 2023-06-16 DIAGNOSIS — R509 Fever, unspecified: Secondary | ICD-10-CM

## 2023-06-16 LAB — BASIC METABOLIC PANEL
Anion gap: 7 (ref 5–15)
BUN: 49 mg/dL — ABNORMAL HIGH (ref 8–23)
CO2: 31 mmol/L (ref 22–32)
Calcium: 8.1 mg/dL — ABNORMAL LOW (ref 8.9–10.3)
Chloride: 107 mmol/L (ref 98–111)
Creatinine, Ser: 1.12 mg/dL (ref 0.61–1.24)
GFR, Estimated: >60 mL/min (ref >60)
Glucose, Bld: 178 mg/dL — ABNORMAL HIGH (ref 70–99)
Potassium: 3.6 mmol/L (ref 3.5–5.1)
Sodium: 145 mmol/L (ref 135–145)

## 2023-06-16 LAB — CBC
HCT: 31.5 % — ABNORMAL LOW (ref 39.0–52.0)
Hemoglobin: 9.5 g/dL — ABNORMAL LOW (ref 13.0–17.0)
MCH: 31.5 pg (ref 26.0–34.0)
MCHC: 30.2 g/dL (ref 30.0–36.0)
MCV: 104.3 fL — ABNORMAL HIGH (ref 80.0–100.0)
Platelets: 132 10*3/uL — ABNORMAL LOW (ref 150–400)
RBC: 3.02 MIL/uL — ABNORMAL LOW (ref 4.22–5.81)
RDW: 19.9 % — ABNORMAL HIGH (ref 11.5–15.5)
WBC: 6.3 10*3/uL (ref 4.0–10.5)
nRBC: 0 % (ref 0.0–0.2)

## 2023-06-16 LAB — GLUCOSE, CAPILLARY
Glucose-Capillary: 175 mg/dL — ABNORMAL HIGH (ref 70–99)
Glucose-Capillary: 150 mg/dL — ABNORMAL HIGH (ref 70–99)
Glucose-Capillary: 151 mg/dL — ABNORMAL HIGH (ref 70–99)
Glucose-Capillary: 147 mg/dL — ABNORMAL HIGH (ref 70–99)
Glucose-Capillary: 152 mg/dL — ABNORMAL HIGH (ref 70–99)
Glucose-Capillary: 146 mg/dL — ABNORMAL HIGH (ref 70–99)

## 2023-06-16 LAB — HEPARIN LEVEL (UNFRACTIONATED): Heparin Unfractionated: >1.1 [IU]/mL — ABNORMAL HIGH (ref 0.30–0.70)

## 2023-06-16 MED ORDER — POTASSIUM CHLORIDE 20 MEQ PO PACK
40.0000 meq | PACK | Freq: Once | ORAL | Status: AC
  Administered 2023-06-16: 40 meq
  Filled 2023-06-16: qty 2

## 2023-06-16 MED ORDER — HEPARIN BOLUS VIA INFUSION
4000.0000 [IU] | Freq: Once | INTRAVENOUS | Status: AC
  Administered 2023-06-16: 4000 [IU] via INTRAVENOUS
  Filled 2023-06-16: qty 4000

## 2023-06-16 MED ORDER — FREE WATER
100.0000 mL | Status: DC
  Administered 2023-06-16 – 2023-06-17 (×11): 100 mL

## 2023-06-16 MED ORDER — HEPARIN (PORCINE) 25000 UT/250ML-% IV SOLN
1500.0000 [IU]/h | INTRAVENOUS | Status: AC
  Administered 2023-06-16 – 2023-06-17 (×2): 1500 [IU]/h via INTRAVENOUS
  Filled 2023-06-16 (×2): qty 250

## 2023-06-16 NOTE — Progress Notes (Signed)
[facility] ADULT ICU REPLACEMENT PROTOCOL   The patient does apply for the [facility] Adult ICU Electrolyte Replacment Protocol based on the criteria listed below:   1.Exclusion criteria: TCTS, ECMO, Dialysis, and Myasthenia Gravis patients 2. Is GFR >/= 30 ml/min? Yes.    Patient's GFR today is >60 3. Is SCr </= 2? Yes.   Patient's SCr is 1.12 mg/dL 4. Did SCr increase >/= 0.5 in 24 hours? No. 5.Pt's weight >40kg  Yes.   6. Abnormal electrolyte(s):   K 3.6  7. Electrolytes replaced per protocol 8.  Call MD STAT for K+ </= 2.5, Phos </= 1, or Mag </= 1 Physician:  E. Reyes Ivan R Mackinzie Vuncannon 06/16/2023 5:30 AM

## 2023-06-16 NOTE — Progress Notes (Signed)
PHARMACY - ANTICOAGULATION CONSULT NOTE  Pharmacy Consult for Heparin Indication: DVT  Allergies  Allergen Reactions   Codeine     "sends me into orbit"    Patient Measurements: Height: 6\' 1"  (185.4 cm) Weight: 89.2 kg (196 lb 10.4 oz) IBW/kg (Calculated) : 79.9 Heparin Dosing Weight: 89 kg  Vital Signs: Temp: 98.5 F (36.9 C) (10/16 1533) Temp Source: Axillary (10/16 1533) BP: 117/49 (10/16 1500) Pulse Rate: 94 (10/16 1500)  Labs: Recent Labs    06/14/23 0553 06/15/23 0506 06/15/23 1620 06/16/23 0426  HGB 10.3* 10.3*  --  9.5*  HCT 34.3* 34.6*  --  31.5*  PLT 140* 158  --  132*  CREATININE 1.24 1.22 1.21 1.12    Estimated Creatinine Clearance: 70.3 mL/min (by C-G formula based on SCr of 1.12 mg/dL).   Medical History: Past Medical History:  Diagnosis Date   Anxiety    Cancer (HCC)    Depression    Hypertension    OCD (obsessive compulsive disorder)     Medications:  Scheduled:   sodium chloride   Intravenous Once   acetaminophen  1,000 mg Per Tube Q8H   Chlorhexidine Gluconate Cloth  6 each Topical Daily   famotidine  20 mg Per Tube BID   feeding supplement (PROSource TF20)  60 mL Per Tube Daily   fiber supplement (BANATROL TF)  60 mL Per Tube TID   folic acid  1 mg Per Tube Daily   free water  100 mL Per Tube Q2H   Gerhardt's butt cream   Topical BID   heparin injection (subcutaneous)  5,000 Units Subcutaneous Q8H   hydrALAZINE  50 mg Per Tube Q8H   insulin aspart  2 Units Subcutaneous Q4H   insulin aspart  2-6 Units Subcutaneous Q4H   multivitamin with minerals  1 tablet Per Tube Daily   nutrition supplement (JUVEN)  1 packet Per Tube BID BM   mouth rinse  15 mL Mouth Rinse Q2H   oxyCODONE  5 mg Per Tube Q6H   sodium chloride flush  10-40 mL Intracatheter Q12H   Infusions:   feeding supplement (VITAL 1.5 CAL) 65 mL/hr at 06/16/23 1300   piperacillin-tazobactam (ZOSYN)  IV 12.5 mL/hr at 06/16/23 1300    Assessment: 67 y.o. M admitted with  pulmonary edema. Now found to have RUE and LUE DVTs on venous duplex. Pt currently on SQ heparin 5000 units TID for VTE prophylaxis. Pt not on AC PTA. Hgb 9.5, plt 132.  Goal of Therapy:  Heparin level 0.3-0.7 units/ml Monitor platelets by anticoagulation protocol: Yes   Plan:  D/c SQ heparin Heparin IV bolus 4000 units Heparin gtt at 1500 units/hr Will f/u heparin level in 6 hours Daily heparin level and CBC   Christoper Fabian, PharmD, BCPS Please see amion for complete clinical pharmacist phone list 06/16/2023,4:03 PM

## 2023-06-16 NOTE — Progress Notes (Signed)
NAME:  Joshua Eaton, MRN:  [id number], DOB:  May 13, 1956, LOS: 13 ADMISSION DATE:  06/03/2023, CONSULTATION DATE:  06/02/2023 REFERRING MD:  Dr. Nadine Counts [facility] , CHIEF COMPLAINT: Pneumonia  BRIEF  This is a 66 year old male with a past medical history of colon cancer status post right hemicolectomy 2007, COPD, mitral valve Regurgitation who presented to [facility] health on 05/20/2023 for shortness of breath. Prior to presenting, patient was feeling unwell for about 6 days with cough, shortness of breath. He had an initial CXR showing bilateral airspace opacities and was eventually placed on BiPAP and admitted to ICU. On 9/20 he was intubated due to ongoing respiratory failure and antibiotics were widened for multifocal pneumonia. He had echo performed showing EF 60-65%, severe dilated LA, severe MR. Throughout his course at [facility] had ongoing ARDS picture, developed worsening AKI. Eventually, [facility] PCCM team was consulted for transfer here due to ongoing high vent requirements, unable to wean. Additionally, there was concern over need for MV replacement and possible need for right heart cath.   Pertinent  Medical History  Colon cancer, status post hemicolectomy (right) 2007, questionable COPD wears CPAP at home, mitral valve regurgitation   Significant Hospital Events: Including procedures, antibiotic start and stop dates in addition to other pertinent events   Admitted to [facility] health on 05/20/2023 06/03/2023: Transferred from [place] [facility] to [facility] MRSA PCR negative 06/04/2023 Tracheal aspirate no growth 10/5 failed SBT 10/06: Tracheostomy  06/08/2023 Blood culture no growth as of 06/13/2023  06/09/2023: TEE performed at bedside 10/10: Mitral valve perforation on echo. Cardiology discussed limited options Tracheal aspirate normal for 10/11: CTS consulted 10/12: GOC with family yesterday. Currently not surgical candidate. Agreed to continue current care, Good UOP with diuresis,  Diarrhea unchanged, Tolerating PS C. difficile screen negative   Interim History / Subjective:   Remains on ventilator stable O2.  Afebrile on Zosyn.  More alert, tracks, seems to attend, seems to follow simple commands  Objective   Blood pressure 130/65, pulse 91, temperature 98.4 F (36.9 C), temperature source Axillary, resp. rate 18, height 6\' 1"  (1.854 m), weight 89.2 kg, SpO2 98%. Vent Mode: PSV;CPAP FiO2 (%):  [40 %] 40 % Set Rate:  [15 bmp] 15 bmp Vt Set:  [550 mL] 550 mL PEEP:  [5 cmH20] 5 cmH20 Pressure Support:  [8 cmH20] 8 cmH20 Plateau Pressure:  [16 cmH20-22 cmH20] 16 cmH20   Intake/Output Summary (Last 24 hours) at 06/16/2023 1019 Last data filed at 06/16/2023 1000 Gross per 24 hour  Intake 2061.35 ml  Output 1335 ml  Net 726.35 ml   Filed Weights   06/12/23 0116 06/13/23 0500 06/16/23 0355  Weight: 88.7 kg 86.9 kg 89.2 kg    Physical Exam: General Appearance:  Looks chronically critically ill Head:  Normocephalic, without obvious abnormality, atraumatic Eyes:  PERRL - yes, conjunctiva/corneas - muddy     Ears:  Normal external ear canals, both ears Nose:  G tube - yes Throat:  ETT TUBE - no , OG tube - no. TRACH + Neck:  Supple,  No enlargement/tenderness/nodules Lungs: Clear to auscultation bilaterally, breathing over ventilator Heart:  S1 and S2 normal, no murmur, CVP - no.  Pressors - no Abdomen:  Soft, no masses, no organomegaly Genitalia / Rectal:  Not done Extremities:  Extremities- intact Skin:  ntact in exposed areas .  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 Family  has requested to minimize cardiology consults unless needed -Appreciate TCTS involvement. Not a candidate. Consider reevaluation versus transfer to tertiary center if stabilizes -Hydralazine for afterload reduction, increased 10/14 with elevated BPs   #Acute hypoxemic respiratory failure status post tracheostomy still requiring mechanical ventilation #Pulmonary edema secondary to HFpEF exacerbation #Severe mitral regurgitation #Aortic valve insufficiency #Multifocal pneumonia PLAN -PRVC, tolerating PSV trials, trial TC -Diuresis holiday with soft BPs -VAP bundle, stress ulcer prophylaxis -Continue Zosyn for aspiration pneumonia  Acute metabolic encephalopathy 2/2 critical illness plan -Stop Klonopin 10/14, continue oxycodone given signs of pain on exam -CT head ordered 10/14 without significant findings to explain encephalopathy  #Fevers - RUQ neg. Resp culture with OP flora.  Blood cultures neg. Foley discontinued. Concern aspiration. ID consult. C Diff negative.  Improved with initiation of Zosyn for presumed aspiration pneumonia. -Zosyn x  7 days through 06/16/23 -Check UE and LE [place]  ordered 10/12 (still not performed)  #Macrocytic anemia #Thrombocytopenia -- Transfuse if hemoglobin 7 or less  #Severe protein calorie malnutrition  -Continue tube feeds  #BPH Great urine output.  No concerns. -Continue on external urinary catheter  #Hyperbilirubinemia -Resolved  #Hypernatremia: Resolved with increase free water flushes -Decrease free water flush 10/16 Best Practice (right click and "Reselect all SmartList Selections" daily)   Diet/type: tubefeeds DVT prophylaxis: Heparin GI prophylaxis: PPI Lines: PICC needed Foley:  Yes, and it is still needed Code Status:  full code  06/15/23 Extensively discussed goals of care and plan of care with wife at bedside, sister on the phone    LABS    PULMONARY No results for input(s): "PHART", "PCO2ART", "PO2ART", "HCO3",  "TCO2", "O2SAT" in the last 168 hours.  Invalid input(s): "PCO2", "PO2"  CBC Recent Labs  Lab 06/14/23 0553 06/15/23 0506 06/16/23 0426  HGB 10.3* 10.3* 9.5*  HCT 34.3* 34.6* 31.5*  WBC 7.4 8.7 6.3  PLT 140* 158 132*    COAGULATION No results for input(s): "INR" in the last 168 hours.  CARDIAC  No results for input(s): "TROPONINI" in the last 168 hours. No results for input(s): "PROBNP" in the last 168 hours.   CHEMISTRY Recent Labs  Lab 06/11/23 0335 06/12/23 0404 06/13/23 0328 06/14/23 0553 06/15/23 0506 06/15/23 1620 06/16/23 0426  NA 150* 151* 149* 152* 147* 148* 145  K 3.7 4.5 4.0 4.1 3.4* 4.2 3.6  CL 110 108 106 110 106 106 107  CO2 33* 33* 32 33* 32 31 31  GLUCOSE 154* 166* 174* 160* 191* 156* 178*  BUN 46* 48* 51* 50* 45* 49* 49*  CREATININE 1.13 1.23 1.20 1.24 1.22 1.21 1.12  CALCIUM 7.9* 8.1* 8.0* 8.2* 8.5* 8.3* 8.1*  MG 2.6* 2.9* 3.0* 3.0* 3.0*  --   --   PHOS 3.0  --   --  2.6  --   --   --    Estimated Creatinine Clearance: 70.3 mL/min (by C-G formula based on SCr of 1.12 mg/dL).   LIVER Recent Labs  Lab 06/11/23 0335 06/13/23 0328 06/14/23 0553  AST 38 31 28  ALT 30 25 24   ALKPHOS 49 46 45  BILITOT 1.3* 1.0 1.1  PROT 5.2* 5.5* 5.9*  ALBUMIN 1.9* 1.9* 2.0*     INFECTIOUS No results for input(s): "  LATICACIDVEN", "PROCALCITON" in the last 168 hours.   ENDOCRINE CBG (last 3)  Recent Labs    06/15/23 2315 06/16/23 0317 06/16/23 0740  GLUCAP 141* 175* 150*         IMAGING x48h  - image(s) personally visualized  -   highlighted in bold CT HEAD WO CONTRAST ( )  Result Date: 06/15/2023 CLINICAL DATA:  Inpatient with encephalopathy. EXAM: CT HEAD WITHOUT CONTRAST TECHNIQUE: Contiguous axial images were obtained from the base of the skull through the vertex without intravenous contrast. RADIATION DOSE REDUCTION: This exam was performed according to the departmental dose-optimization program which includes automated exposure  control, adjustment of the mA and/or kV according to patient size and/or use of iterative reconstruction technique. COMPARISON:  Head CT 05/21/2023 FINDINGS: Brain: There is mild cerebral atrophy and small-vessel disease, unremarkable cerebellum and brainstem. No asymmetry is seen concerning for a cortical based acute infarct, hemorrhage, mass or mass effect. There is no midline shift. The ventricles are normal in size and position. Basal cisterns are patent. Vascular: There are patchy calcifications in both carotid siphons. There are no hyperdense central vessels. Skull: Negative for fractures or focal lesions. Sinuses/Orbits: Clear paranasal sinuses. Negative visible orbits. Interval nasoenteric intubation. There is new patchy fluid in the left mastoid air cells, trace fluid in the right mastoid tip also new. Both middle ear cavities are clear. Other: None. IMPRESSION: 1. No acute intracranial CT findings or intracranial interval changes. 2. New patchy fluid in the left mastoid air cells and trace fluid in the right mastoid tip. 3. Interval nasoenteric intubation. Electronically Signed   By: Almira Bar M.D.   On: 06/15/2023 07:23   CT CHEST WO CONTRAST  Result Date: 06/15/2023 CLINICAL DATA:  Pulmonary venous congestion versus multifocal infection on chest x-rays. EXAM: CT CHEST WITHOUT CONTRAST TECHNIQUE: Multidetector CT imaging of the chest was performed following the standard protocol without IV contrast. RADIATION DOSE REDUCTION: This exam was performed according to the departmental dose-optimization program which includes automated exposure control, adjustment of the mA and/or kV according to patient size and/or use of iterative reconstruction technique. COMPARISON:  Portable chest 06/12/2023, portable chest 06/09/2023, chest CT without contrast 05/31/2023, CTA chest 05/20/2023. FINDINGS: Cardiovascular: There is mild cardiomegaly with a left chamber predominance. Scattered two-vessel coronary  calcific plaques in the circumflex and LAD. There is no substantial pericardial effusion. Scattered calcific plaques noted in the aorta and great vessels but no aortic aneurysm. Mild aortic tortuosity. Central pulmonary veins are slightly prominent. Pulmonary arteries are normal caliber. A right PICC terminates in the distal SVC. Mediastinum/Nodes: Assessment for hilar adenopathy is difficult without contrast but there is no contour deforming abnormality. Tracheostomy cannula terminates 2 cm above the carina. The trachea is clear below the cannula but above the cannula there are moderate layering secretions in the distal cervical trachea. There is a feeding tube in the esophagus with the tip in the distal gastric antrum. No thyroid nodule is seen, no intrathoracic, axillary or supraclavicular adenopathy. Lungs/Pleura: There are minimal layering pleural effusions. There is subpleural septal thickening in the lung fields consistent with interstitial edema, improved compared with 05/31/2023. There is confluent consolidation with air bronchograms in the bilateral lower lobes primarily the basilar segments which is probably due to pneumonia or aspiration. This is similar to the prior study. Patchy hazy airspace disease is noted in the bilateral upper and right middle lobes where the prior CT demonstrated patchy dense airspace disease. This is probably either ground-glass edema or ground-glass pneumonitis.  Upper Abdomen: No acute abnormality. No splenomegaly with AP splenic dimension 16.5 cm. Musculoskeletal: No chest wall mass or suspicious bone lesions identified. IMPRESSION: 1. Cardiomegaly with slight prominence of the central pulmonary veins and minimal layering pleural effusions. 2. Interstitial edema in the lung fields, improved compared with 05/31/2023. 3. Confluent consolidation with air bronchograms in the bilateral lower lobes primarily the basilar segments, probably due to pneumonia or aspiration. This is  similar to the prior CT. 4. Patchy hazy opacities in the bilateral upper and right middle lobes where the prior CT demonstrated denser airspace disease. This is probably either ground-glass edema or ground-glass pneumonitis. 5. Aortic and coronary artery atherosclerosis. 6. Tracheostomy cannula and feeding tube. 7. Moderate layering secretions in the distal cervical trachea above the tracheostomy. 8. Splenomegaly. Electronically Signed   By: Almira Bar M.D.   On: 06/15/2023 07:14    CRITICAL CARE Performed by: Lesia Sago Kerrin Markman   Total critical care time: 32 minutes  Critical care time was exclusive of separately billable procedures and treating other patients.  Critical care was necessary to treat or prevent imminent or life-threatening deterioration.  Critical care was time spent personally by me on the following activities: development of treatment plan with patient and/or surrogate as well as nursing, discussions with consultants, evaluation of patient's response to treatment, examination of patient, obtaining history from patient or surrogate, ordering and performing treatments and interventions, ordering and review of laboratory studies, ordering and review of radiographic studies, pulse oximetry and re-evaluation of patient's condition.  Karren Burly, MD See Loretha Stapler

## 2023-06-16 NOTE — Progress Notes (Signed)
eLink Physician-Brief Progress Note Patient Name: Joshua Eaton DOB: [birth year] MRN: [id number]   Date of Service  06/16/2023  HPI/Events of Note  Received request for AM labs Reviewed previous labs and abnormalities noted  eICU Interventions  Reasonable to repeat AM CBC and electrolytes     Intervention Category Intermediate Interventions: Other:  Darl Pikes 06/16/2023, 4:16 AM

## 2023-06-16 NOTE — Plan of Care (Signed)

## 2023-06-16 NOTE — Progress Notes (Signed)
BUE & BLE venous duplex exams have been completed.  Preliminary findings given to [place], Charity fundraiser.   Results can be found under chart review under CV PROC. 06/16/2023 1:05 PM Lj Miyamoto RVT, RDMS

## 2023-06-17 DIAGNOSIS — J9601 Acute respiratory failure with hypoxia: Secondary | ICD-10-CM | POA: Diagnosis not present

## 2023-06-17 LAB — CBC
HCT: 33.1 % — ABNORMAL LOW (ref 39.0–52.0)
Hemoglobin: 10 g/dL — ABNORMAL LOW (ref 13.0–17.0)
MCH: 31.4 pg (ref 26.0–34.0)
MCHC: 30.2 g/dL (ref 30.0–36.0)
MCV: 104.1 fL — ABNORMAL HIGH (ref 80.0–100.0)
Platelets: 146 10*3/uL — ABNORMAL LOW (ref 150–400)
RBC: 3.18 MIL/uL — ABNORMAL LOW (ref 4.22–5.81)
RDW: 20.3 % — ABNORMAL HIGH (ref 11.5–15.5)
WBC: 6.7 10*3/uL (ref 4.0–10.5)
nRBC: 0 % (ref 0.0–0.2)

## 2023-06-17 LAB — GLUCOSE, CAPILLARY
Glucose-Capillary: 133 mg/dL — ABNORMAL HIGH (ref 70–99)
Glucose-Capillary: 134 mg/dL — ABNORMAL HIGH (ref 70–99)
Glucose-Capillary: 140 mg/dL — ABNORMAL HIGH (ref 70–99)
Glucose-Capillary: 142 mg/dL — ABNORMAL HIGH (ref 70–99)
Glucose-Capillary: 148 mg/dL — ABNORMAL HIGH (ref 70–99)
Glucose-Capillary: 156 mg/dL — ABNORMAL HIGH (ref 70–99)

## 2023-06-17 LAB — MAGNESIUM: Magnesium: 2.8 mg/dL — ABNORMAL HIGH (ref 1.7–2.4)

## 2023-06-17 LAB — BASIC METABOLIC PANEL
Anion gap: 10 (ref 5–15)
BUN: 45 mg/dL — ABNORMAL HIGH (ref 8–23)
CO2: 28 mmol/L (ref 22–32)
Calcium: 8.6 mg/dL — ABNORMAL LOW (ref 8.9–10.3)
Chloride: 108 mmol/L (ref 98–111)
Creatinine, Ser: 0.97 mg/dL (ref 0.61–1.24)
GFR, Estimated: >60 mL/min (ref >60)
Glucose, Bld: 171 mg/dL — ABNORMAL HIGH (ref 70–99)
Potassium: 3.9 mmol/L (ref 3.5–5.1)
Sodium: 146 mmol/L — ABNORMAL HIGH (ref 135–145)

## 2023-06-17 LAB — HEPARIN LEVEL (UNFRACTIONATED)
Heparin Unfractionated: 0.58 [IU]/mL (ref 0.30–0.70)
Heparin Unfractionated: 0.61 [IU]/mL (ref 0.30–0.70)

## 2023-06-17 MED ORDER — FREE WATER
100.0000 mL | Status: DC
  Administered 2023-06-17 – 2023-06-18 (×21): 100 mL

## 2023-06-17 MED ORDER — ENOXAPARIN SODIUM 100 MG/ML IJ SOSY
90.0000 mg | PREFILLED_SYRINGE | Freq: Two times a day (BID) | INTRAMUSCULAR | Status: DC
  Administered 2023-06-17 (×2): 90 mg via SUBCUTANEOUS
  Filled 2023-06-17 (×3): qty 0.9

## 2023-06-17 MED ORDER — POTASSIUM CHLORIDE 20 MEQ PO PACK
20.0000 meq | PACK | Freq: Once | ORAL | Status: AC
  Administered 2023-06-17: 20 meq
  Filled 2023-06-17: qty 1

## 2023-06-17 MED ORDER — FUROSEMIDE 10 MG/ML IJ SOLN
80.0000 mg | Freq: Four times a day (QID) | INTRAMUSCULAR | Status: AC
  Administered 2023-06-17 (×2): 80 mg via INTRAVENOUS
  Filled 2023-06-17 (×2): qty 8

## 2023-06-17 NOTE — Plan of Care (Signed)
  Problem: Education: Goal: Knowledge of General Education information will improve Description: Including pain rating scale, medication(s)/side effects and non-pharmacologic comfort measures Outcome: Not Progressing   Problem: Health Behavior/Discharge Planning: Goal: Ability to manage health-related needs will improve Outcome: Progressing   Problem: Clinical Measurements: Goal: Ability to maintain clinical measurements within normal limits will improve Outcome: Progressing Goal: Will remain free from infection Outcome: Progressing Goal: Diagnostic test results will improve Outcome: Progressing Goal: Respiratory complications will improve Outcome: Progressing Goal: Cardiovascular complication will be avoided Outcome: Progressing   Problem: Activity: Goal: Risk for activity intolerance will decrease Outcome: Progressing   Problem: Nutrition: Goal: Adequate nutrition will be maintained Outcome: Progressing   Problem: Coping: Goal: Level of anxiety will decrease Outcome: Progressing   Problem: Elimination: Goal: Will not experience complications related to bowel motility Outcome: Progressing Goal: Will not experience complications related to urinary retention Outcome: Progressing   Problem: Pain Managment: Goal: General experience of comfort will improve Outcome: Progressing   Problem: Safety: Goal: Ability to remain free from injury will improve Outcome: Progressing   Problem: Skin Integrity: Goal: Risk for impaired skin integrity will decrease Outcome: Progressing   Problem: Activity: Goal: Ability to tolerate increased activity will improve Outcome: Progressing   Problem: Respiratory: Goal: Ability to maintain a clear airway and adequate ventilation will improve Outcome: Progressing   Problem: Role Relationship: Goal: Method of communication will improve Outcome: Progressing   Problem: Safety: Goal: Non-violent Restraint(s) Outcome: Progressing    Problem: Education: Goal: Knowledge about tracheostomy care/management will improve Outcome: Progressing   Problem: Activity: Goal: Ability to tolerate increased activity will improve Outcome: Progressing   Problem: Health Behavior/Discharge Planning: Goal: Ability to manage tracheostomy will improve Outcome: Progressing   Problem: Respiratory: Goal: Patent airway maintenance will improve Outcome: Progressing   Problem: Role Relationship: Goal: Ability to communicate will improve Outcome: Progressing

## 2023-06-17 NOTE — Progress Notes (Signed)
SLP Cancellation Note  Patient Details Name: Joshua Eaton MRN: [id number] DOB: 11/26/1955   Cancelled treatment:       Reason Eval/Treat Not Completed: Patient not medically ready. SLP will continue to follow.   Gwynneth Aliment, M.A., CF-SLP Speech Language Pathology, Acute Rehabilitation Services  Secure Chat preferred [phone]  06/17/2023, 9:28 AM

## 2023-06-17 NOTE — Progress Notes (Signed)
PHARMACY - ANTICOAGULATION CONSULT NOTE  Pharmacy Consult for Heparin Indication: DVT  Allergies  Allergen Reactions   Codeine     "sends me into orbit"    Patient Measurements: Height: 6\' 1"  (185.4 cm) Weight: 89.2 kg (196 lb 10.4 oz) IBW/kg (Calculated) : 79.9 Heparin Dosing Weight: 89 kg  Vital Signs: Temp: 98.3 F (36.8 C) (10/16 2331) Temp Source: Oral (10/16 2331) BP: 146/56 (10/16 2000) Pulse Rate: 96 (10/16 2000)  Labs: Recent Labs    06/14/23 0553 06/15/23 0506 06/15/23 1620 06/16/23 0426 06/16/23 2250 06/17/23 0010  HGB 10.3* 10.3*  --  9.5*  --   --   HCT 34.3* 34.6*  --  31.5*  --   --   PLT 140* 158  --  132*  --   --   HEPARINUNFRC  --   --   --   --  >1.10* 0.61  CREATININE 1.24 1.22 1.21 1.12  --   --     Estimated Creatinine Clearance: 70.3 mL/min (by C-G formula based on SCr of 1.12 mg/dL).   Medical History: Past Medical History:  Diagnosis Date   Anxiety    Cancer (HCC)    Depression    Hypertension    OCD (obsessive compulsive disorder)     Medications:  Scheduled:   sodium chloride   Intravenous Once   acetaminophen  1,000 mg Per Tube Q8H   Chlorhexidine Gluconate Cloth  6 each Topical Daily   famotidine  20 mg Per Tube BID   feeding supplement (PROSource TF20)  60 mL Per Tube Daily   fiber supplement (BANATROL TF)  60 mL Per Tube TID   folic acid  1 mg Per Tube Daily   free water  100 mL Per Tube Q2H   Gerhardt's butt cream   Topical BID   hydrALAZINE  50 mg Per Tube Q8H   insulin aspart  2 Units Subcutaneous Q4H   insulin aspart  2-6 Units Subcutaneous Q4H   multivitamin with minerals  1 tablet Per Tube Daily   nutrition supplement (JUVEN)  1 packet Per Tube BID BM   mouth rinse  15 mL Mouth Rinse Q2H   oxyCODONE  5 mg Per Tube Q6H   sodium chloride flush  10-40 mL Intracatheter Q12H   Infusions:   feeding supplement (VITAL 1.5 CAL) 65 mL/hr at 06/16/23 2000   heparin 1,500 Units/hr (06/16/23 2000)    piperacillin-tazobactam (ZOSYN)  IV 3.375 g (06/16/23 2003)    Assessment: 67 y.o. M admitted with pulmonary edema. Now found to have RUE and LUE DVTs on venous duplex. Pt currently on SQ heparin 5000 units TID for VTE prophylaxis. Pt not on AC PTA. Hgb 9.5, plt 132.  10/17 AM update:  Heparin level therapeutic   Goal of Therapy:  Heparin level 0.3-0.7 units/ml Monitor platelets by anticoagulation protocol: Yes   Plan:  Cont heparin 1500 units/hr Heparin level with AM labs  Abran Duke, PharmD, BCPS Clinical Pharmacist Phone: [phone]

## 2023-06-17 NOTE — Progress Notes (Signed)
PHARMACY - ANTICOAGULATION CONSULT NOTE  Pharmacy Consult for Heparin > enoxaparin  Indication: DVT  Allergies  Allergen Reactions   Codeine     "sends me into orbit"    Patient Measurements: Height: 6\' 1"  (185.4 cm) Weight: 89.9 kg (198 lb 3.1 oz) IBW/kg (Calculated) : 79.9 Heparin Dosing Weight: 89 kg  Vital Signs: Temp: 98.6 F (37 C) (10/17 0752) Temp Source: Oral (10/17 0752) BP: 127/51 (10/17 0800) Pulse Rate: 100 (10/17 0800)  Labs: Recent Labs    06/15/23 0506 06/15/23 1620 06/16/23 0426 06/16/23 2250 06/17/23 0010 06/17/23 0354  HGB 10.3*  --  9.5*  --   --  10.0*  HCT 34.6*  --  31.5*  --   --  33.1*  PLT 158  --  132*  --   --  146*  HEPARINUNFRC  --   --   --  >1.10* 0.61 0.58  CREATININE 1.22 1.21 1.12  --   --  0.97    Estimated Creatinine Clearance: 81.2 mL/min (by C-G formula based on SCr of 0.97 mg/dL).   Medical History: Past Medical History:  Diagnosis Date   Anxiety    Cancer (HCC)    Depression    Hypertension    OCD (obsessive compulsive disorder)     Medications:  Scheduled:   sodium chloride   Intravenous Once   acetaminophen  1,000 mg Per Tube Q8H   Chlorhexidine Gluconate Cloth  6 each Topical Daily   famotidine  20 mg Per Tube BID   feeding supplement (PROSource TF20)  60 mL Per Tube Daily   fiber supplement (BANATROL TF)  60 mL Per Tube TID   folic acid  1 mg Per Tube Daily   free water  100 mL Per Tube Q1H   furosemide  80 mg Intravenous Q6H   Gerhardt's butt cream   Topical BID   hydrALAZINE  50 mg Per Tube Q8H   insulin aspart  2 Units Subcutaneous Q4H   insulin aspart  2-6 Units Subcutaneous Q4H   multivitamin with minerals  1 tablet Per Tube Daily   nutrition supplement (JUVEN)  1 packet Per Tube BID BM   mouth rinse  15 mL Mouth Rinse Q2H   oxyCODONE  5 mg Per Tube Q6H   sodium chloride flush  10-40 mL Intracatheter Q12H   Infusions:   feeding supplement (VITAL 1.5 CAL) 65 mL/hr at 06/17/23 0800   heparin  1,500 Units/hr (06/17/23 0800)    Assessment: 67 y.o. M admitted with pulmonary edema. Now found to have RUE and LUE DVTs on venous duplex. Pharmacy consulted to transition from heparin gtt > enoxaparin SQ for therapeutic anticoagulation in setting of DVTs.   Hgb 10, Plt 146   Goal of Therapy:  Heparin level 0.3-0.7 units/ml Monitor platelets by anticoagulation protocol: Yes   Plan:  Stop heparin infusion and give first dose enoxaparin at that time Enoxaparin 1mg /kg (90mg ) q12h  F/u CBC, renal function, levels PRN per protocol F/u transition to enteral therapies as appropriate  Calton Dach, PharmD, BCCCP Clinical Pharmacist 06/17/2023 9:04 AM

## 2023-06-17 NOTE — Progress Notes (Signed)
NAME:  Joshua Eaton, MRN:  [id number], DOB:  [birth year], LOS: 14 ADMISSION DATE:  06/03/2023, CONSULTATION DATE:  06/02/2023 REFERRING MD:  Dr. Nadine Counts [facility] , CHIEF COMPLAINT: Pneumonia  BRIEF  This is a 67 year old male with a past medical history of colon cancer status post right hemicolectomy 2007, COPD, mitral valve Regurgitation who presented to [facility] health on 05/20/2023 for shortness of breath. Prior to presenting, patient was feeling unwell for about 6 days with cough, shortness of breath. He had an initial CXR showing bilateral airspace opacities and was eventually placed on BiPAP and admitted to ICU. On 9/20 he was intubated due to ongoing respiratory failure and antibiotics were widened for multifocal pneumonia. He had echo performed showing EF 60-65%, severe dilated LA, severe MR. Throughout his course at [facility] had ongoing ARDS picture, developed worsening AKI. Eventually, [facility] P[facility]M team was consulted for transfer here due to ongoing high vent requirements, unable to wean. Additionally, there was concern over need for MV replacement and possible need for right heart cath.   Pertinent  Medical History  Colon cancer, status post hemicolectomy (right) 2007, questionable COPD wears CPAP at home, mitral valve regurgitation   Significant Hospital Events: Including procedures, antibiotic start and stop dates in addition to other pertinent events   Admitted to [facility] on 05/20/2023 06/03/2023: Transferred from [facility] to [facility] MRSA PCR negative 06/04/2023 Tracheal aspirate no growth 10/5 failed SBT 10/06: Tracheostomy  06/08/2023 Blood culture no growth as of 06/13/2023  06/09/2023: TEE performed at bedside 10/10: Mitral valve perforation on echo. Cardiology discussed limited options Tracheal aspirate normal for 10/11: CTS consulted 10/12: GOC with family yesterday. Currently not surgical candidate. Agreed to continue current care, Good UOP with diuresis,  Diarrhea unchanged, Tolerating PS C. difficile screen negative   Interim History / Subjective:   Tolerated TC several hours, placed back on rate 230 AM.  Objective   Blood pressure (!) 122/42, pulse 92, temperature 98.6 F (37 C), temperature source Oral, resp. rate (!) 21, height 6\' 1"  (1.854 m), weight 89.9 kg, SpO2 92%. Vent Mode: PSV;CPAP FiO2 (%):  [40 %] 40 % Set Rate:  [15 bmp] 15 bmp Vt Set:  [550 mL] 550 mL PEEP:  [5 cmH20] 5 cmH20 Pressure Support:  [8 cmH20] 8 cmH20   Intake/Output Summary (Last 24 hours) at 06/17/2023 0935 Last data filed at 06/17/2023 0931 Gross per 24 hour  Intake 2235.73 ml  Output 2235 ml  Net 0.73 ml   Filed Weights   06/13/23 0500 06/16/23 0355 06/17/23 0409  Weight: 86.9 kg 89.2 kg 89.9 kg    Physical Exam: General Appearance:  Looks chronically critically ill Head:  Normocephalic, without obvious abnormality, atraumatic Eyes:  PERRL - yes, conjunctiva/corneas - muddy     Ears:  Normal external ear canals, both ears Nose:  G tube - yes Throat:  ETT TUBE - no , OG tube - no. TRACH + Neck:  Supple,  No enlargement/tenderness/nodules Lungs: Clear to auscultation bilaterally, breathing over ventilator Heart:  S1 and S2 normal, no murmur Abdomen:  Soft, no masses, no organomegaly Genitalia / Rectal:  Not done Extremities:  Extremities- intact Skin:  ntact in exposed areas .  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 Family has requested to minimize cardiology consults unless needed -Appreciate TCTS involvement. Not a candidate. Consider reevaluation versus transfer to  tertiary center if stabilizes -Hydralazine for afterload reduction, increased 10/14 with elevated BPs   #Acute hypoxemic respiratory failure status post tracheostomy still requiring mechanical ventilation #Pulmonary edema secondary to HFpEF exacerbation #Severe mitral regurgitation #Aortic valve insufficiency #Multifocal pneumonia  PLAN -PRVC, tolerating PSV trials, trial TC -Lasix 80 mg IV x 2 -VAP bundle, stress ulcer prophylaxis -s/p 7 days zosyn (end date 10/16)  Acute metabolic encephalopathy 2/2 critical illness plan -Stop Klonopin 10/14, continue oxycodone given signs of pain on exam -CT head ordered 10/14 without significant findings to explain encephalopathy  #Fevers - RUQ neg. Resp culture with OP flora.  Blood cultures neg. Foley discontinued. Concern aspiration. ID consult. C Diff negative.  Improved with initiation of Zosyn for presumed aspiration pneumonia. -Zosyn x  7 days through 06/16/23 lead to defervescence  -UE DVT on Dopplers 10/16  # Upper extremity DVT: -- Lovenox 1 mg/KG twice daily, consider transition to DOAC once surgical planning finalized with no plans for additional invasive procedures  #Macrocytic anemia #Thrombocytopenia -- Transfuse if hemoglobin 7 or less  #Severe protein calorie malnutrition  -Continue tube feeds  #BPH Great urine output.  No concerns. -Continue on external urinary catheter  #Hyperbilirubinemia -Resolved  #Hypernatremia: Resolved with increase free water flushes, mild recurrence much decreasing free water flush -Increase free water flush 10/17  Best Practice (right click and "Reselect all SmartList Selections" daily)   Diet/type: tubefeeds DVT prophylaxis: Treatment dose Lovenox for DVT GI prophylaxis: PPI Lines: PICC needed Foley:  Yes, and it is still needed Code Status:  full code  06/16/23 Wife updated at bedside in detail    LABS    PULMONARY No results for input(s): "PHART", "PCO2ART", "PO2ART",  "HCO3", "TCO2", "O2SAT" in the last 168 hours.  Invalid input(s): "PCO2", "PO2"  CBC Recent Labs  Lab 06/15/23 0506 06/16/23 0426 06/17/23 0354  HGB 10.3* 9.5* 10.0*  HCT 34.6* 31.5* 33.1*  WBC 8.7 6.3 6.7  PLT 158 132* 146*    COAGULATION No results for input(s): "INR" in the last 168 hours.  CARDIAC  No results for input(s): "TROPONINI" in the last 168 hours. No results for input(s): "PROBNP" in the last 168 hours.   CHEMISTRY Recent Labs  Lab 06/11/23 0335 06/12/23 0404 06/13/23 0328 06/14/23 0553 06/15/23 0506 06/15/23 1620 06/16/23 0426 06/17/23 0354  NA 150* 151* 149* 152* 147* 148* 145 146*  K 3.7 4.5 4.0 4.1 3.4* 4.2 3.6 3.9  CL 110 108 106 110 106 106 107 108  CO2 33* 33* 32 33* 32 31 31 28   GLUCOSE 154* 166* 174* 160* 191* 156* 178* 171*  BUN 46* 48* 51* 50* 45* 49* 49* 45*  CREATININE 1.13 1.23 1.20 1.24 1.22 1.21 1.12 0.97  CALCIUM 7.9* 8.1* 8.0* 8.2* 8.5* 8.3* 8.1* 8.6*  MG 2.6* 2.9* 3.0* 3.0* 3.0*  --   --  2.8*  PHOS 3.0  --   --  2.6  --   --   --   --    Estimated Creatinine Clearance: 81.2 mL/min (by C-G formula based on SCr of 0.97 mg/dL).   LIVER Recent Labs  Lab 06/11/23 0335 06/13/23 0328 06/14/23 0553  AST 38 31 28  ALT 30 25 24   ALKPHOS 49 46 45  BILITOT 1.3* 1.0  1.1  PROT 5.2* 5.5* 5.9*  ALBUMIN 1.9* 1.9* 2.0*     INFECTIOUS No results for input(s): "LATICACIDVEN", "PROCALCITON" in the last 168 hours.   ENDOCRINE CBG (last 3)  Recent Labs    06/16/23 2312 06/17/23 0317 06/17/23 0747  GLUCAP 146* 156* 148*         IMAGING x48h  - image(s) personally visualized  -   highlighted in bold VAS [place] LOWER EXTREMITY VENOUS (DVT)  Result Date: 06/16/2023  Lower Venous DVT Study Patient Name:  RONTAE INGLETT  Date of Exam:   06/16/2023 Medical Rec #: [id number]   Accession #:    [id number] Date of Birth: 1956-08-14    Patient Gender: M Patient Age:   26 years Exam Location:  [facility] Procedure:      VAS [place] LOWER  EXTREMITY VENOUS (DVT) Referring Phys: CHI ELLISON --------------------------------------------------------------------------------  Indications: Fever. Other Indications: Critically ill patient, prolonged immobility. Limitations: Poor ultrasound/tissue interface and patient movement. Comparison Study: Previous exam at [facility] on 03/07/2018 was negative for DVT Performing Technologist: Ernestene Mention RVT, RDMS  Examination Guidelines: A complete evaluation includes B-mode imaging, spectral Doppler, color Doppler, and power Doppler as needed of all accessible portions of each vessel. Bilateral testing is considered an integral part of a complete examination. Limited examinations for reoccurring indications may be performed as noted. The reflux portion of the exam is performed with the patient in reverse Trendelenburg.  +---------+---------------+---------+-----------+----------+-------------------+ RIGHT    CompressibilityPhasicitySpontaneityPropertiesThrombus Aging      +---------+---------------+---------+-----------+----------+-------------------+ CFV      Full           No       Yes                                      +---------+---------------+---------+-----------+----------+-------------------+ SFJ      Full                                                             +---------+---------------+---------+-----------+----------+-------------------+ FV Prox  Full           Yes      Yes                                      +---------+---------------+---------+-----------+----------+-------------------+ FV Mid   Full           Yes      Yes                                      +---------+---------------+---------+-----------+----------+-------------------+ FV DistalFull           Yes      Yes                                      +---------+---------------+---------+-----------+----------+-------------------+ PFV      Full                                                              +---------+---------------+---------+-----------+----------+-------------------+  POP      Full           Yes      Yes                                      +---------+---------------+---------+-----------+----------+-------------------+ PTV      Full                                         Not well visualized +---------+---------------+---------+-----------+----------+-------------------+ PERO     Full                                         Not well visualized +---------+---------------+---------+-----------+----------+-------------------+   +---------+---------------+---------+-----------+----------+-------------------+ LEFT     CompressibilityPhasicitySpontaneityPropertiesThrombus Aging      +---------+---------------+---------+-----------+----------+-------------------+ CFV      Full           Yes      Yes                                      +---------+---------------+---------+-----------+----------+-------------------+ SFJ      Full                                                             +---------+---------------+---------+-----------+----------+-------------------+ FV Prox  Full           Yes      Yes                                      +---------+---------------+---------+-----------+----------+-------------------+ FV Mid   Full           Yes      Yes                                      +---------+---------------+---------+-----------+----------+-------------------+ FV DistalFull           Yes      Yes                                      +---------+---------------+---------+-----------+----------+-------------------+ PFV      Full                                                             +---------+---------------+---------+-----------+----------+-------------------+ POP      Full           Yes      Yes                                       +---------+---------------+---------+-----------+----------+-------------------+  PTV      Full                                         Not well visualized +---------+---------------+---------+-----------+----------+-------------------+ PERO     Full                                         Not well visualized +---------+---------------+---------+-----------+----------+-------------------+     Summary: BILATERAL: - No evidence of deep vein thrombosis seen in the lower extremities, bilaterally. -No evidence of popliteal cyst, bilaterally.   *See table(s) above for measurements and observations. Electronically signed by Coral Else MD on 06/16/2023 at 7:20:23 PM.    Final    VAS [place] UPPER EXTREMITY VENOUS DUPLEX  Result Date: 06/16/2023 UPPER VENOUS STUDY  Patient Name:  QUADRE BRISTOL  Date of Exam:   06/16/2023 Medical Rec #: [id number]   Accession #:    [id number] Date of Birth: 09/16/1955    Patient Gender: M Patient Age:   88 years Exam Location:  [facility] Procedure:      VAS [place] UPPER EXTREMITY VENOUS DUPLEX Referring Phys: CHI ELLISON --------------------------------------------------------------------------------  Indications: Fever Other Indications: Critically ill patient, BUE IV/line placements. Limitations: PICC line placement (RUE) and continuous patient movement/agitation. Comparison Study: No previous exams Performing Technologist: Jody Hill RVT, RDMS  Examination Guidelines: A complete evaluation includes B-mode imaging, spectral Doppler, color Doppler, and power Doppler as needed of all accessible portions of each vessel. Bilateral testing is considered an integral part of a complete examination. Limited examinations for reoccurring indications may be performed as noted.  Right Findings: +----------+------------+---------+-----------+----------+-------+ RIGHT     CompressiblePhasicitySpontaneousPropertiesSummary  +----------+------------+---------+-----------+----------+-------+ IJV           Full       Yes       Yes                      +----------+------------+---------+-----------+----------+-------+ Subclavian    Full       Yes       Yes                      +----------+------------+---------+-----------+----------+-------+ Axillary    Partial      Yes       Yes               Acute  +----------+------------+---------+-----------+----------+-------+ Brachial      Full                                          +----------+------------+---------+-----------+----------+-------+ Radial        Full                                          +----------+------------+---------+-----------+----------+-------+ Ulnar         Full                                          +----------+------------+---------+-----------+----------+-------+ Cephalic  None       No        No                Acute  +----------+------------+---------+-----------+----------+-------+ Basilic       Full                                          +----------+------------+---------+-----------+----------+-------+ RUE limited due to patient movement/agitation.  Left Findings: +----------+------------+---------+-----------+----------+-------+ LEFT      CompressiblePhasicitySpontaneousPropertiesSummary +----------+------------+---------+-----------+----------+-------+ IJV           Full       Yes       Yes                      +----------+------------+---------+-----------+----------+-------+ Subclavian    Full       Yes       Yes                      +----------+------------+---------+-----------+----------+-------+ Axillary      Full       Yes       Yes                      +----------+------------+---------+-----------+----------+-------+ Brachial      Full       Yes       Yes                      +----------+------------+---------+-----------+----------+-------+ Radial         Full                                          +----------+------------+---------+-----------+----------+-------+ Ulnar         Full                                          +----------+------------+---------+-----------+----------+-------+ Cephalic      None       No        No                Acute  +----------+------------+---------+-----------+----------+-------+ Basilic       Full       Yes       Yes                      +----------+------------+---------+-----------+----------+-------+  Summary:  Right: Findings consistent with acute deep vein thrombosis involving the right axillary vein. Findings consistent with acute superficial vein thrombosis involving the right cephalic vein.  Left: Findings consistent with acute superficial vein thrombosis involving the left cephalic vein.  *See table(s) above for measurements and observations.  Diagnosing physician: Coral Else MD Electronically signed by Coral Else MD on 06/16/2023 at 7:19:36 PM.    Final     CRITICAL CARE Performed by: Lesia Sago Donavyn Fecher   Total critical care time: 31 minutes  Critical care time was exclusive of separately billable procedures and treating other patients.  Critical care was necessary to treat or prevent imminent or life-threatening deterioration.  Critical care was time spent personally by me on the following activities: development of treatment plan with  patient and/or surrogate as well as nursing, discussions with consultants, evaluation of patient's response to treatment, examination of patient, obtaining history from patient or surrogate, ordering and performing treatments and interventions, ordering and review of laboratory studies, ordering and review of radiographic studies, pulse oximetry and re-evaluation of patient's condition.  Karren Burly, MD See Loretha Stapler

## 2023-06-18 ENCOUNTER — Inpatient Hospital Stay (HOSPITAL_COMMUNITY): Payer: Medicare Other

## 2023-06-18 DIAGNOSIS — N179 Acute kidney failure, unspecified: Secondary | ICD-10-CM | POA: Diagnosis not present

## 2023-06-18 DIAGNOSIS — J810 Acute pulmonary edema: Secondary | ICD-10-CM | POA: Diagnosis not present

## 2023-06-18 DIAGNOSIS — J9601 Acute respiratory failure with hypoxia: Secondary | ICD-10-CM | POA: Diagnosis not present

## 2023-06-18 DIAGNOSIS — I351 Nonrheumatic aortic (valve) insufficiency: Secondary | ICD-10-CM | POA: Diagnosis not present

## 2023-06-18 LAB — POCT I-STAT 7, (LYTES, BLD GAS, ICA,H+H)
Acid-Base Excess: 7 mmol/L — ABNORMAL HIGH (ref 0.0–2.0)
Bicarbonate: 32.3 mmol/L — ABNORMAL HIGH (ref 20.0–28.0)
Calcium, Ion: 1.22 mmol/L (ref 1.15–1.40)
HCT: 36 % — ABNORMAL LOW (ref 39.0–52.0)
Hemoglobin: 12.2 g/dL — ABNORMAL LOW (ref 13.0–17.0)
O2 Saturation: 99 %
Potassium: 4.9 mmol/L (ref 3.5–5.1)
Sodium: 145 mmol/L (ref 135–145)
TCO2: 34 mmol/L — ABNORMAL HIGH (ref 22–32)
pCO2 arterial: 46.5 mm[Hg] (ref 32–48)
pH, Arterial: 7.451 — ABNORMAL HIGH (ref 7.35–7.45)
pO2, Arterial: 142 mm[Hg] — ABNORMAL HIGH (ref 83–108)

## 2023-06-18 LAB — BASIC METABOLIC PANEL
Anion gap: 8 (ref 5–15)
BUN: 45 mg/dL — ABNORMAL HIGH (ref 8–23)
CO2: 30 mmol/L (ref 22–32)
Calcium: 9 mg/dL (ref 8.9–10.3)
Chloride: 105 mmol/L (ref 98–111)
Creatinine, Ser: 0.84 mg/dL (ref 0.61–1.24)
GFR, Estimated: >60 mL/min (ref >60)
Glucose, Bld: 170 mg/dL — ABNORMAL HIGH (ref 70–99)
Potassium: 3.7 mmol/L (ref 3.5–5.1)
Sodium: 143 mmol/L (ref 135–145)

## 2023-06-18 LAB — GLUCOSE, CAPILLARY
Glucose-Capillary: 172 mg/dL — ABNORMAL HIGH (ref 70–99)
Glucose-Capillary: 108 mg/dL — ABNORMAL HIGH (ref 70–99)
Glucose-Capillary: 100 mg/dL — ABNORMAL HIGH (ref 70–99)
Glucose-Capillary: 96 mg/dL (ref 70–99)
Glucose-Capillary: 139 mg/dL — ABNORMAL HIGH (ref 70–99)
Glucose-Capillary: 140 mg/dL — ABNORMAL HIGH (ref 70–99)

## 2023-06-18 LAB — TROPONIN I (HIGH SENSITIVITY)
Troponin I (High Sensitivity): 21 ng/L — ABNORMAL HIGH (ref <18)
Troponin I (High Sensitivity): 21 ng/L — ABNORMAL HIGH (ref <18)

## 2023-06-18 LAB — CBC
HCT: 33.1 % — ABNORMAL LOW (ref 39.0–52.0)
Hemoglobin: 10.3 g/dL — ABNORMAL LOW (ref 13.0–17.0)
MCH: 33 pg (ref 26.0–34.0)
MCHC: 31.1 g/dL (ref 30.0–36.0)
MCV: 106.1 fL — ABNORMAL HIGH (ref 80.0–100.0)
Platelets: 148 10*3/uL — ABNORMAL LOW (ref 150–400)
RBC: 3.12 MIL/uL — ABNORMAL LOW (ref 4.22–5.81)
RDW: 20.4 % — ABNORMAL HIGH (ref 11.5–15.5)
WBC: 7.9 10*3/uL (ref 4.0–10.5)
nRBC: 0.3 % — ABNORMAL HIGH (ref 0.0–0.2)

## 2023-06-18 MED ORDER — MIDAZOLAM HCL 2 MG/2ML IJ SOLN
INTRAMUSCULAR | Status: AC
  Administered 2023-06-18: 2 mg via INTRAVENOUS
  Filled 2023-06-18: qty 2

## 2023-06-18 MED ORDER — ETOMIDATE 2 MG/ML IV SOLN
20.0000 mg | Freq: Once | INTRAVENOUS | Status: AC
  Administered 2023-06-18: 20 mg via INTRAVENOUS
  Filled 2023-06-18: qty 10

## 2023-06-18 MED ORDER — PANTOPRAZOLE SODIUM 40 MG IV SOLR
40.0000 mg | INTRAVENOUS | Status: DC
  Administered 2023-06-18 – 2023-06-28 (×11): 40 mg via INTRAVENOUS
  Filled 2023-06-18 (×11): qty 10

## 2023-06-18 MED ORDER — MIDAZOLAM HCL 2 MG/2ML IJ SOLN: 2.0000 mg | Freq: Once | INTRAMUSCULAR | Status: AC

## 2023-06-18 MED ORDER — ROCURONIUM BROMIDE 10 MG/ML (PF) SYRINGE
100.0000 mg | PREFILLED_SYRINGE | Freq: Once | INTRAVENOUS | Status: AC
  Administered 2023-06-18: 100 mg via INTRAVENOUS
  Filled 2023-06-18: qty 10

## 2023-06-18 MED ORDER — ROCURONIUM BROMIDE 10 MG/ML (PF) SYRINGE
PREFILLED_SYRINGE | INTRAVENOUS | Status: AC
  Administered 2023-06-18: 100 mg via INTRAVENOUS
  Filled 2023-06-18: qty 10

## 2023-06-18 MED ORDER — ETOMIDATE 2 MG/ML IV SOLN
INTRAVENOUS | Status: AC
  Administered 2023-06-18: 20 mg via INTRAVENOUS
  Filled 2023-06-18: qty 20

## 2023-06-18 MED ORDER — FENTANYL CITRATE PF 50 MCG/ML IJ SOSY: 100.0000 ug | PREFILLED_SYRINGE | Freq: Once | INTRAMUSCULAR | Status: AC

## 2023-06-18 MED ORDER — FENTANYL CITRATE PF 50 MCG/ML IJ SOSY
200.0000 ug | PREFILLED_SYRINGE | Freq: Once | INTRAMUSCULAR | Status: AC
  Administered 2023-06-18: 200 ug via INTRAVENOUS
  Filled 2023-06-18: qty 4

## 2023-06-18 MED ORDER — ROCURONIUM BROMIDE 10 MG/ML (PF) SYRINGE: 100.0000 mg | PREFILLED_SYRINGE | Freq: Once | INTRAVENOUS | Status: AC

## 2023-06-18 MED ORDER — FENTANYL CITRATE PF 50 MCG/ML IJ SOSY
PREFILLED_SYRINGE | INTRAMUSCULAR | Status: AC
  Administered 2023-06-18: 100 ug via INTRAVENOUS
  Filled 2023-06-18: qty 2

## 2023-06-18 MED ORDER — PROPOFOL 1000 MG/100ML IV EMUL
5.0000 ug/kg/min | INTRAVENOUS | Status: DC
  Administered 2023-06-18 – 2023-06-19 (×9): 60 ug/kg/min via INTRAVENOUS
  Administered 2023-06-20: 5 ug/kg/min via INTRAVENOUS
  Administered 2023-06-20: 40 ug/kg/min via INTRAVENOUS
  Administered 2023-06-21: 5 ug/kg/min via INTRAVENOUS
  Administered 2023-06-21: 50 ug/kg/min via INTRAVENOUS
  Filled 2023-06-18 (×3): qty 100
  Filled 2023-06-18: qty 300
  Filled 2023-06-18: qty 100
  Filled 2023-06-18: qty 200
  Filled 2023-06-18 (×5): qty 100

## 2023-06-18 MED ORDER — MIDAZOLAM HCL 2 MG/2ML IJ SOLN
4.0000 mg | Freq: Once | INTRAMUSCULAR | Status: AC
  Administered 2023-06-18: 4 mg via INTRAVENOUS
  Filled 2023-06-18: qty 4

## 2023-06-18 MED ORDER — PROPOFOL 1000 MG/100ML IV EMUL
INTRAVENOUS | Status: AC
  Administered 2023-06-18: 40 ug/kg/min via INTRAVENOUS
  Filled 2023-06-18: qty 100

## 2023-06-18 MED ORDER — ETOMIDATE 2 MG/ML IV SOLN: 20.0000 mg | Freq: Once | INTRAVENOUS | Status: AC

## 2023-06-18 MED ORDER — NOREPINEPHRINE 4 MG/250ML-% IV SOLN
INTRAVENOUS | Status: AC
  Administered 2023-06-18: 2 ug/min via INTRAVENOUS
  Filled 2023-06-18: qty 250

## 2023-06-18 MED ORDER — POTASSIUM CHLORIDE 20 MEQ PO PACK
40.0000 meq | PACK | Freq: Once | ORAL | Status: AC
  Administered 2023-06-18: 40 meq
  Filled 2023-06-18: qty 2

## 2023-06-18 MED ORDER — FENTANYL CITRATE PF 50 MCG/ML IJ SOSY
PREFILLED_SYRINGE | INTRAMUSCULAR | Status: AC
  Filled 2023-06-18: qty 2

## 2023-06-18 MED ORDER — NOREPINEPHRINE 4 MG/250ML-% IV SOLN
0.0000 ug/min | INTRAVENOUS | Status: DC
  Administered 2023-06-19: 2 ug/min via INTRAVENOUS
  Filled 2023-06-18: qty 250

## 2023-06-18 MED ORDER — LIDOCAINE-EPINEPHRINE 1 %-1:100000 IJ SOLN
20.0000 mL | Freq: Once | INTRAMUSCULAR | Status: AC
  Administered 2023-06-18: 20 mL via INTRADERMAL
  Filled 2023-06-18: qty 1

## 2023-06-18 MED ORDER — SUCCINYLCHOLINE CHLORIDE 200 MG/10ML IV SOSY
PREFILLED_SYRINGE | INTRAVENOUS | Status: AC
  Filled 2023-06-18: qty 10

## 2023-06-18 MED ORDER — FENTANYL CITRATE PF 50 MCG/ML IJ SOSY
100.0000 ug | PREFILLED_SYRINGE | Freq: Once | INTRAMUSCULAR | Status: AC
  Administered 2023-06-18: 100 ug via INTRAVENOUS

## 2023-06-18 NOTE — Procedures (Signed)
Percutaneous Tracheostomy Procedure Note   Joshua Eaton  [id number]  27-Feb-1956  Date:06/18/23  Time:2:07 PM   Provider Performing:Trevante Tennell  Procedure: Percutaneous Tracheostomy with Bronchoscopic Guidance (78295)  Indication(s) Acute respiratory failure  Consent Risks of the procedure as well as the alternatives and risks of each were explained to the patient and/or caregiver.  Consent for the procedure was obtained.  Anesthesia Etomidate, Versed, Fentanyl, Vecuronium   Time Out Verified patient identification, verified procedure, site/side was marked, verified correct patient position, special equipment/implants available, medications/allergies/relevant history reviewed, required imaging and test results available.   Sterile Technique Maximal sterile technique including sterile barrier drape, hand hygiene, sterile gown, sterile gloves, mask, hair covering.    Procedure Description Appropriate anatomy identified by palpation.  Patient's neck prepped and draped in sterile fashion.  1% lidocaine with epinephrine was used to anesthetize skin overlying neck.  1.5cm incision made and blunt dissection performed until tracheal rings could be easily palpated.   Then a size 8 XLT promimal tracheostomy was placed under bronchoscopic visualization using usual Seldinger technique and serial dilation.   Bronchoscope confirmed placement above the carina.  Tracheostomy was sutured in place with adhesive pad to protect skin under pressure.    Patient connected to ventilator.   Complications/Tolerance None; patient tolerated the procedure well. Chest X-ray is ordered to confirm no post-procedural complication.   EBL Minimal   Specimen(s) None

## 2023-06-18 NOTE — Progress Notes (Addendum)
eLink Physician-Brief Progress Note Patient Name: Joshua Eaton DOB: 1955/10/19 MRN: [id number]   Date of Service  06/18/2023  HPI/Events of Note  67 year old male with a past medical history of colon cancer status post right hemicolectomy 2007, COPD, mitral valve Regurgitation who presented to [facility] on 05/20/2023 for shortness of breath   Tracheostomy in place.  Patient has been extremely uncomfortable transitioning from trach trials high flow to CPAP/PRVC.  Every time he is placed on the vent, he is agitated.    Substantial air leak noted on the ventilator with every breath on CPAP 10/5.  Although he is comfortable with bag mask ventilation and the ventilator, no obvious air leak at the level of the balloon, tracheostomy positioning, or oropharynx.  eICU Interventions  Will attempt to replace the ventilator although no obvious leak is identified in the ventilator tubing.  Replace the ventilator with minimal improvement.  Sounds like he has upper airway leak with the ventilator in place.  Although the balloon is taut, this may represent balloon dysfunction.  Will request ground team evaluation for potential tracheostomy exchange.   0601 -in the setting of deep sedation, the patient is developing mild hypotension.  EKG reviewed (ST changes on monitor for leads I and aVL) without evidence of acute ischemic changes.  K3.7.  Will add KCl.  Hold tube feeds for potential tracheostomy exchange  Intervention Category Intermediate Interventions: Respiratory distress - evaluation and management  Mikhail Hallenbeck 06/18/2023, 4:49 AM

## 2023-06-18 NOTE — Procedures (Addendum)
06/18/2023 Bronch/intubation note Indication: trach leak  Sedation given  Bronch advanced through trach and positioned within tracheal lumen  Bronch removed and then advanced through cords from above: balloon partially within stoma: attempts to adjust position could not advance balloon entirely within tracheal lumen.  Further sedation given and new prox XLT advanced through airway, unfortunately this was too long and nearly abutted carina.  Pulling it out slightly caused leak to re-occur.  Thought was then this will require trach repositioning under bronchoscopic guidance with physician running scope and physician adjusting tracheostomy.  Patient intubated from above and prox XLT removed as ETT advanced past stoma.  Leak resolved for now.  Keep heavily sedated until trach revision.  RN to inform family.  Myrla Halsted MD PCCM

## 2023-06-18 NOTE — Procedures (Signed)
Intubation Procedure Note  Joshua Eaton  [id number]  [birth year]  Date:06/18/23  Time:5:46 AM   Provider Performing:Catherine Oak L Judi Saa    Procedure: Assisted Intubation  Indication(s) Respiratory Failure  Consent Unable to obtain consent due to emergent nature of procedure.   Time Out Verified patient identification, verified procedure, site/side was marked, verified correct patient position, special equipment/implants available, medications/allergies/relevant history reviewed, required imaging and test results available.   Sterile Technique Usual hand hygeine, masks, and gloves were used   Procedure Description Patient positioned in bed supine.  Sedation given as noted above.  Patient was intubated with endotracheal tube using Glidescope.   Number of attempts was 1.  Colorimetric CO2 detector was consistent with tracheal placement.   Complications/Tolerance None; patient tolerated the procedure well.    Specimen(s) None  Pt intubated after trach exchange was unsuccessful. Pt to ventilator with stable vitals. Will cont to monitor

## 2023-06-18 NOTE — Plan of Care (Signed)
[facility] Tonsillectomy/Adenoidectomy, Postoperative PEDS care plan entered in error.

## 2023-06-18 NOTE — Procedures (Signed)
Diagnostic Bronchoscopy  Joshua Eaton  [id number]  08/20/1956  Date:06/18/23  Time:2:05 PM   Provider Performing:Takiyah Bohnsack   Procedure: Diagnostic Bronchoscopy (40981)  Indication(s) Assist with direct visualization of tracheostomy placement  Consent Risks of the procedure as well as the alternatives and risks of each were explained to the patient and/or caregiver.  Consent for the procedure was obtained.   Anesthesia See separate tracheostomy note   Time Out Verified patient identification, verified procedure, site/side was marked, verified correct patient position, special equipment/implants available, medications/allergies/relevant history reviewed, required imaging and test results available.   Sterile Technique Usual hand hygiene, masks, gowns, and gloves were used   Procedure Description Bronchoscope advanced through endotracheal tube and into airway.  After suctioning out tracheal secretions, bronchoscope used to provide direct visualization of tracheostomy placement. I was able to visualize the placement of the needle and advancement of the wire. An 8.0 Shiley proximal XLT was placed over the wire via seldinger technique. The bronchoscope was then advanced through the tracheostomy tube and confirmed it was in the airway, the tracheostomy tube ended above the level of the carina.  I then proceeded to withdraw the ETT to visualize the previous stoma, the tracheostomy tube was visualized through the stoma, but entry into the trachea was below the level of the previously formed stoma. The subglottic space was examined, cleared of secretions, and the ETT withdrawn and taken out.   Complications/Tolerance None; patient tolerated the procedure well.   EBL None  Specimen(s) None  [name], MD [facility] 06/18/2023 2:08 PM

## 2023-06-18 NOTE — Progress Notes (Signed)
NAME:  Joshua Eaton, MRN:  [id number], DOB:  05-11-1956, LOS: 15 ADMISSION DATE:  06/03/2023, CHIEF COMPLAINT:  Respiratory failure   History of Present Illness:   This is a 67 year old male with a past medical history of colon cancer status post right hemicolectomy 2007, COPD, mitral valve Regurgitation who presented to [facility] on 05/20/2023 for shortness of breath. Prior to presenting, patient was feeling unwell for about 6 days with cough, shortness of breath. He had an initial CXR showing bilateral airspace opacities and was eventually placed on BiPAP and admitted to ICU. On 9/20 he was intubated due to ongoing respiratory failure and antibiotics were widened for multifocal pneumonia. He had echo performed showing EF 60-65%, severe dilated LA, severe MR. Throughout his course at [facility] had ongoing ARDS picture, developed worsening AKI. Eventually, [facility] PCCM team was consulted for transfer here due to ongoing high vent requirements, unable to wean. Additionally, there was concern over need for MV replacement and possible need for right heart cath.    Pertinent  Medical History  Colon cancer, status post hemicolectomy (right) 2007, questionable COPD wears CPAP at home, mitral valve regurgitation   Significant Hospital Events: Including procedures, antibiotic start and stop dates in addition to other pertinent events   Admitted to [facility] health on 05/20/2023 06/03/2023: Transferred from [facility] to [facility] MRSA PCR negative 06/04/2023 Tracheal aspirate no growth 10/5 failed SBT 10/06: Tracheostomy  06/08/2023 Blood culture no growth as of 06/13/2023 06/09/2023: TEE performed at bedside 10/10: Mitral valve perforation on echo. Cardiology discussed limited options Tracheal aspirate normal for 10/11: CTS consulted 10/12: GOC with family yesterday. Currently not surgical candidate. Agreed to continue current care, Good UOP with diuresis, Diarrhea unchanged, Tolerating PS C.  difficile screen negative 10/18: overnight with cuff leak, trach evaluated, re-intubated from above, trach revision today  Interim History / Subjective:  Patient intubated and sedated this morning  Objective   Blood pressure (!) 113/49, pulse 80, temperature (!) 97 F (36.1 C), temperature source Axillary, resp. rate 15, height 6\' 1"  (1.854 m), weight 89.9 kg, SpO2 94%.    Vent Mode: PRVC FiO2 (%):  [40 %-60 %] 60 % Set Rate:  [15 bmp] 15 bmp Vt Set:  [550 mL-630 mL] 630 mL PEEP:  [5 cmH20] 5 cmH20 Plateau Pressure:  [23 cmH20] 23 cmH20   Intake/Output Summary (Last 24 hours) at 06/18/2023 1000 Last data filed at 06/18/2023 0900 Gross per 24 hour  Intake 1405.71 ml  Output 2050 ml  Net -644.29 ml   Filed Weights   06/13/23 0500 06/16/23 0355 06/17/23 0409  Weight: 86.9 kg 89.2 kg 89.9 kg    Examination: Physical Exam Constitutional:      General: He is not in acute distress.    Appearance: He is ill-appearing. He is not toxic-appearing.  Cardiovascular:     Rate and Rhythm: Normal rate and regular rhythm.     Pulses: Normal pulses.     Heart sounds: Normal heart sounds.  Pulmonary:     Comments: Ventilated breath sounds bilaterally Abdominal:     Palpations: Abdomen is soft.  Musculoskeletal:     Right lower leg: No edema.     Left lower leg: No edema.  Neurological:     Mental Status: He is disoriented.     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 Family has requested to minimize cardiology consults unless needed -Appreciate TCTS involvement. Not a candidate. Consider reevaluation versus  transfer to tertiary center if stabilizes -Hydralazine for afterload reduction, increased 10/14 with elevated BPs  #Acute hypoxemic respiratory failure status post tracheostomy still requiring mechanical ventilation #Pulmonary edema secondary to HFpEF exacerbation #Severe mitral regurgitation #Aortic valve insufficiency #Multifocal pneumonia  PLAN -PRVC, tolerating PSV trials -tolerated trach collar, cuff now with leak, balloon was in the stoma  -needs trach revision > performed 10/18  -will wean back to trach collar tomorrow -VAP bundle, stress ulcer prophylaxis -s/p 7 days zosyn (end date 10/16)   Acute metabolic encephalopathy 2/2 critical illness plan -Stop Klonopin 10/14, continue oxycodone given signs of pain on exam -CT head ordered 10/14 without significant findings to explain encephalopathy  #Fevers - RUQ neg. Resp culture with OP flora.  Blood cultures neg. Foley discontinued. Concern aspiration. ID consult. C Diff negative.  Improved with initiation of Zosyn for presumed aspiration pneumonia. -Zosyn x  7 days through 06/16/23 lead to defervescence  -UE DVT on Dopplers 10/16   # Upper extremity DVT: -- Lovenox 1 mg/KG twice daily, consider transition to DOAC once surgical planning finalized with no plans for additional invasive procedures  -hold lovenox this AM for trach revision  -restart tomorrow AM   #Macrocytic anemia #Thrombocytopenia -- Transfuse if hemoglobin 7 or less   #Severe protein calorie malnutrition  -Continue tube feeds > held for trach revision, resumed   #BPH Great urine output.  No concerns. -Continue on external urinary catheter   #Hyperbilirubinemia -Resolved   #Hypernatremia: Resolved with increase free water flushes, mild recurrence much decreasing free water flush -holding furosemide today, will hold free water flushes  Best Practice (right click and "Reselect all SmartList Selections" daily)   Diet/type: tubefeeds DVT prophylaxis:  systemic dose LMWH GI prophylaxis: PPI Lines: Central line and yes and it is still needed Foley:  N/A Code Status:  full code Last date of multidisciplinary goals of care discussion [06/18/2023]  Labs   CBC: Recent Labs  Lab 06/14/23 0553 06/15/23 0506 06/16/23 0426 06/17/23 0354 06/18/23 0338  WBC 7.4 8.7 6.3 6.7 7.9  HGB 10.3* 10.3* 9.5* 10.0* 10.3*  HCT 34.3* 34.6* 31.5* 33.1* 33.1*  MCV 106.5* 104.5* 104.3* 104.1* 106.1*  PLT 140* 158 132* 146* 148*    Basic Metabolic Panel: Recent Labs  Lab 06/12/23 0404 06/13/23 0328 06/14/23 0553 06/15/23 0506 06/15/23 1620 06/16/23 0426 06/17/23 0354 06/18/23 0338  NA 151* 149* 152* 147* 148* 145 146* 143  K 4.5 4.0 4.1 3.4* 4.2 3.6 3.9 3.7  CL 108 106 110 106 106 107 108 105  CO2 33* 32 33* 32 31 31 28 30   GLUCOSE 166* 174* 160* 191* 156* 178* 171* 170*  BUN 48* 51* 50* 45* 49* 49* 45* 45*  CREATININE 1.23 1.20 1.24 1.22 1.21 1.12 0.97 0.84  CALCIUM 8.1* 8.0* 8.2* 8.5* 8.3* 8.1* 8.6* 9.0  MG 2.9* 3.0* 3.0* 3.0*  --   --  2.8*  --   PHOS  --   --  2.6  --   --   --   --   --    GFR: Estimated Creatinine Clearance: 93.8 mL/min (by C-G formula based on SCr of 0.84 mg/dL). Recent Labs  Lab 06/15/23 0506 06/16/23 0426 06/17/23 0354 06/18/23 0338  WBC 8.7 6.3 6.7 7.9    Liver Function Tests: Recent Labs  Lab 06/13/23 0328 06/14/23 0553  AST 31 28  ALT 25 24  ALKPHOS 46 45  BILITOT 1.0 1.1  PROT 5.5* 5.9*  ALBUMIN 1.9* 2.0*   Recent Labs  Lab 06/13/23 0328  LIPASE 43  AMYLASE 108*   No results for input(s): "AMMONIA" in the last 168 hours.  ABG    Component Value Date/Time   HCO3 28.6 (H) 06/03/2023 2229   O2SAT 87.8 06/03/2023 2229     Coagulation Profile: No results for input(s): "INR", "PROTIME" in the last 168 hours.  Cardiac Enzymes: No results for input(s): "CKTOTAL", "CKMB", "CKMBINDEX", "TROPONINI" in the last 168 hours.  HbA1C: Hgb A1c MFr Bld  Date/Time Value Ref Range Status   12/09/2015 06:30 PM 5.1 4.8 - 5.6 % Final    Comment:    (NOTE)         Pre-diabetes: 5.7 - 6.4         Diabetes: >6.4         Glycemic control for adults with diabetes: <7.0     CBG: Recent Labs  Lab 06/17/23 1523 06/17/23 1952 06/17/23 2335 06/18/23 0317 06/18/23 0721  GLUCAP 134* 142* 133* 172* 108*    Review of Systems:   Unable to obtain  Past Medical History:  He,  has a past medical history of Anxiety, Cancer (HCC), Depression, Hypertension, and OCD (obsessive compulsive disorder).   Surgical History:   Past Surgical History:  Procedure Laterality Date   CHOLECYSTECTOMY     COLON SURGERY     cancerous polyp removed   HERNIA REPAIR       Social History:   reports that he has never smoked. He has never used smokeless tobacco. He reports that he does not drink alcohol and does not use drugs.   Family History:  His family history is not on file.   Allergies Allergies  Allergen Reactions   Codeine     "sends me into orbit"     Home Medications  Prior to Admission medications   Medication Sig Start Date End Date Taking? Authorizing Provider  acetaminophen (TYLENOL) 325 MG tablet Take 650 mg by mouth every 6 (six) hours as needed for moderate pain.   Yes [provider]  aspirin EC 81 MG tablet Take 81 mg by mouth daily. Swallow whole.   Yes [provider]  benzonatate (TESSALON) 200 MG capsule Take 200 mg by mouth 3 (three) times daily as needed for cough. 05/11/23  Yes [provider]  budesonide (PULMICORT) 0.5 MG/2ML nebulizer solution Take 0.5 mg by nebulization 2 (two) times daily.   Yes [provider]  chlorhexidine (PERIDEX) 0.12 % solution Use as directed 15 mLs in the mouth or throat 2 (two) times daily.   Yes [provider]  dexmedeTOMIDine HCl (PRECEDEX IV) Inject 1 Dose into the vein every 12 (twelve) hours. in 100 mL 10.58mL/hr Q12H   Yes [provider]  feeding supplement  (OSMOLITE 1 CAL) LIQD Take 1,000 mLs by mouth daily. 27mL/hr   Yes [provider]  fentaNYL citrate (PF) 10 mcg/mL in sodium chloride 0.9 % 200 mL Inject 50 mcg/hr into the vein See admin instructions. 2,562mcg in Rate: 48mL/hr Freq: Q12H   Yes [provider]  guaiFENesin-Codeine 200-20 MG/10ML SOLN Take 10 mLs by mouth every 6 (six) hours as needed (Cough).  Yes [provider]  ipratropium-albuterol (DUONEB) 0.5-2.5 (3) MG/3ML SOLN Take 3 mLs by nebulization every 6 (six) hours.   Yes [provider]  levofloxacin (LEVAQUIN) 750 MG/150ML SOLN Inject 750 mg into the vein daily. Rate: 15ml/hr Dur: 90 minutes Freq: Q24H Started 05/31/23 End 06/03/23   Yes [provider]  LORazepam (ATIVAN) 0.5 MG tablet Take 0.5 mg by mouth in the morning.   Yes [provider]  LORazepam (ATIVAN) 1 MG tablet Take 1 mg by mouth at bedtime.   Yes [provider]  LORazepam (ATIVAN) 2 MG/ML injection Inject 0.5 mg into the vein every 6 (six) hours as needed for anxiety.   Yes [provider]  methylPREDNISolone acetate (DEPO-MEDROL) 40 MG/ML injection 20 mg every 12 (twelve) hours. Started 05/29/23 End 06/12/23   Yes [provider]  METOCLOPRAMIDE HCL IJ Inject 10 mg into the vein every 6 (six) hours.   Yes [provider]  Multiple Vitamins-Minerals (MULTIVITAL PO) Take 5 mLs by mouth in the morning and at bedtime.   Yes [provider]  OLANZapine (ZYPREXA) 2.5 MG tablet Take 1 tablet (2.5 mg total) by mouth at bedtime. 01/20/23  Yes Cottle, Steva Ready., MD  ondansetron [facility]) 4 MG/2ML SOLN injection Inject 4 mg into the vein every 6 (six) hours as needed for nausea or vomiting.   Yes [provider]  pantoprazole 40 mg in sodium chloride 0.9 % 100 mL Inject 40 mg into the vein every 12 (twelve) hours.   Yes [provider]  polyethylene glycol (MIRALAX / GLYCOLAX) 17 g packet Take 17 g  by mouth 2 (two) times daily as needed for moderate constipation. Give with lunch and evening meal if no BM during the day as needed   Yes [provider]  promethazine (PHENERGAN) 25 MG/ML injection Inject 12.5 mg into the muscle every 6 (six) hours as needed for nausea or vomiting.   Yes [provider]  propofol (DIPRIVAN) 1000 MG/100ML EMUL injection Inject 2.939 mLs into the vein See admin instructions. Rate: 2.91mL/hr Dur: 34hr 2 min Freq: Every 6 hours   Yes [provider]  propranolol (INDERAL) 20 MG tablet TAKE 1 TO 2 TABLETS BY MOUTH  TWICE DAILY AS NEEDED 01/21/23  Yes Cottle, Steva Ready., MD  sennosides-docusate sodium (SENOKOT-S) 8.6-50 MG tablet Take 1 tablet by mouth 2 (two) times daily as needed for constipation.   Yes [provider]  zolpidem (AMBIEN) 5 MG tablet Take 5 mg by mouth at bedtime as needed for sleep.   Yes [provider]  doxycycline (VIBRAMYCIN) 100 MG capsule Take 100 mg by mouth 2 (two) times daily. Patient not taking: Reported on 06/05/2023 05/14/23   [provider]  gabapentin (NEURONTIN) 100 MG capsule Take 300 mg by mouth at bedtime. Patient not taking: Reported on 06/05/2023 12/11/15   [provider]  loratadine (CLARITIN) 10 MG tablet Take 10 mg by mouth daily as needed for allergies. Patient not taking: Reported on 06/05/2023 05/11/23   [provider]  meloxicam (MOBIC) 7.5 MG tablet Take 7.5 mg by mouth 2 (two) times daily. Patient not taking: Reported on 06/05/2023 04/04/23   [provider]  methocarbamol (ROBAXIN) 500 MG tablet Take 500 mg by mouth 2 (two) times daily as needed for muscle spasms. Patient not taking: Reported on 06/05/2023 04/04/23   [provider]  predniSONE (DELTASONE) 20 MG tablet Take 10-40 mg by mouth See admin instructions. AKE 2  TABLETS (40mg ) BY MOUTH ONCE DAILY UNTIL SYMPTOMS IMPROVE, THEN DECREASE TO 1/2 TABLET (10mg ) EVERY OTHER DAY Patient  not taking: Reported on 06/05/2023 05/14/23   [provider]  promethazine-dextromethorphan (PROMETHAZINE-DM) 6.25-15 MG/5ML syrup Take 5 mLs by mouth every 6 (six) hours as needed for cough. Patient not taking: Reported on 06/05/2023 05/11/23   [provider]  sertraline (ZOLOFT) 100 MG tablet TAKE 2 TABLETS BY MOUTH IN THE  MORNING AND 1 TABLET BY MOUTH IN THE EVENING 03/17/23   Cottle, Steva Ready., MD     Critical care time: 53 minutes    [name], MD [facility]

## 2023-06-18 NOTE — Procedures (Signed)
Bedside Tracheostomy Insertion Procedure Note   Patient Details:   Name: Joshua Eaton DOB: Oct 09, 1955 MRN: [id number]  Procedure: Tracheostomy exchange. Pt exchanged from 8.0 Shiley to 8.0 XLT. Pt did not tolerate and pt intubated via ETT      Spero Geralds 06/18/2023, 5:51 AM

## 2023-06-18 NOTE — Progress Notes (Signed)
SLP Cancellation Note  Patient Details Name: Joshua Eaton MRN: [id number] DOB: May 10, 1956   Cancelled treatment:       Reason Eval/Treat Not Completed: Patient not medically ready. SLP will continue to follow for PMV readiness.    Gwynneth Aliment, M.A., CF-SLP Speech Language Pathology, Acute Rehabilitation Services  Secure Chat preferred [phone]  06/18/2023, 2:21 PM

## 2023-06-19 ENCOUNTER — Inpatient Hospital Stay (HOSPITAL_COMMUNITY): Payer: Medicare Other

## 2023-06-19 DIAGNOSIS — I351 Nonrheumatic aortic (valve) insufficiency: Secondary | ICD-10-CM | POA: Diagnosis not present

## 2023-06-19 DIAGNOSIS — N179 Acute kidney failure, unspecified: Secondary | ICD-10-CM | POA: Diagnosis not present

## 2023-06-19 DIAGNOSIS — J810 Acute pulmonary edema: Secondary | ICD-10-CM | POA: Diagnosis not present

## 2023-06-19 DIAGNOSIS — J9601 Acute respiratory failure with hypoxia: Secondary | ICD-10-CM | POA: Diagnosis not present

## 2023-06-19 LAB — CBC
HCT: 30.2 % — ABNORMAL LOW (ref 39.0–52.0)
Hemoglobin: 9.9 g/dL — ABNORMAL LOW (ref 13.0–17.0)
MCH: 33.7 pg (ref 26.0–34.0)
MCHC: 32.8 g/dL (ref 30.0–36.0)
MCV: 102.7 fL — ABNORMAL HIGH (ref 80.0–100.0)
Platelets: 150 10*3/uL (ref 150–400)
RBC: 2.94 MIL/uL — ABNORMAL LOW (ref 4.22–5.81)
RDW: 19.9 % — ABNORMAL HIGH (ref 11.5–15.5)
WBC: 8.4 10*3/uL (ref 4.0–10.5)
nRBC: 0.4 % — ABNORMAL HIGH (ref 0.0–0.2)

## 2023-06-19 LAB — BASIC METABOLIC PANEL
Anion gap: 7 (ref 5–15)
BUN: 55 mg/dL — ABNORMAL HIGH (ref 8–23)
CO2: 28 mmol/L (ref 22–32)
Calcium: 8.4 mg/dL — ABNORMAL LOW (ref 8.9–10.3)
Chloride: 104 mmol/L (ref 98–111)
Creatinine, Ser: 1.2 mg/dL (ref 0.61–1.24)
GFR, Estimated: >60 mL/min (ref >60)
Glucose, Bld: 134 mg/dL — ABNORMAL HIGH (ref 70–99)
Potassium: 5.1 mmol/L (ref 3.5–5.1)
Sodium: 139 mmol/L (ref 135–145)

## 2023-06-19 LAB — TRIGLYCERIDES
Triglycerides: 1025 mg/dL — ABNORMAL HIGH (ref <150)
Triglycerides: 122 mg/dL (ref <150)

## 2023-06-19 LAB — LACTIC ACID, PLASMA
Lactic Acid, Venous: 0.9 mmol/L (ref 0.5–1.9)
Lactic Acid, Venous: 1 mmol/L (ref 0.5–1.9)

## 2023-06-19 LAB — GLUCOSE, CAPILLARY
Glucose-Capillary: 127 mg/dL — ABNORMAL HIGH (ref 70–99)
Glucose-Capillary: 145 mg/dL — ABNORMAL HIGH (ref 70–99)
Glucose-Capillary: 133 mg/dL — ABNORMAL HIGH (ref 70–99)
Glucose-Capillary: 143 mg/dL — ABNORMAL HIGH (ref 70–99)
Glucose-Capillary: 120 mg/dL — ABNORMAL HIGH (ref 70–99)
Glucose-Capillary: 132 mg/dL — ABNORMAL HIGH (ref 70–99)

## 2023-06-19 LAB — PROTIME-INR
INR: 1.1 (ref 0.8–1.2)
Prothrombin Time: 14.7 s (ref 11.4–15.2)

## 2023-06-19 LAB — MAGNESIUM
Magnesium: 3.1 mg/dL — ABNORMAL HIGH (ref 1.7–2.4)
Magnesium: 2.8 mg/dL — ABNORMAL HIGH (ref 1.7–2.4)

## 2023-06-19 LAB — APTT: aPTT: 31 s (ref 24–36)

## 2023-06-19 MED ORDER — ENOXAPARIN SODIUM 100 MG/ML IJ SOSY
90.0000 mg | PREFILLED_SYRINGE | Freq: Two times a day (BID) | INTRAMUSCULAR | Status: DC
  Administered 2023-06-19: 90 mg via SUBCUTANEOUS
  Filled 2023-06-19 (×2): qty 0.9

## 2023-06-19 MED ORDER — MIDAZOLAM HCL 2 MG/2ML IJ SOLN
2.0000 mg | Freq: Once | INTRAMUSCULAR | Status: AC
  Administered 2023-06-19: 2 mg via INTRAVENOUS
  Filled 2023-06-19: qty 2

## 2023-06-19 MED ORDER — DEXMEDETOMIDINE HCL IN NACL 400 MCG/100ML IV SOLN
INTRAVENOUS | Status: AC
  Filled 2023-06-19: qty 100

## 2023-06-19 MED ORDER — DEXMEDETOMIDINE HCL IN NACL 400 MCG/100ML IV SOLN
0.0000 ug/kg/h | INTRAVENOUS | Status: DC
  Administered 2023-06-19: 0.7 ug/kg/h via INTRAVENOUS
  Administered 2023-06-19: 0.4 ug/kg/h via INTRAVENOUS
  Filled 2023-06-19: qty 100

## 2023-06-19 MED ORDER — FUROSEMIDE 10 MG/ML IJ SOLN
80.0000 mg | Freq: Two times a day (BID) | INTRAMUSCULAR | Status: AC
  Administered 2023-06-19: 80 mg via INTRAVENOUS
  Filled 2023-06-19: qty 8

## 2023-06-19 MED ORDER — FUROSEMIDE 10 MG/ML IJ SOLN
INTRAMUSCULAR | Status: AC
  Filled 2023-06-19: qty 8

## 2023-06-19 NOTE — Plan of Care (Signed)
  Problem: Clinical Measurements: Goal: Ability to maintain clinical measurements within normal limits will improve Outcome: Progressing Goal: Will remain free from infection Outcome: Progressing Goal: Diagnostic test results will improve Outcome: Progressing Goal: Respiratory complications will improve Outcome: Progressing   Problem: Nutrition: Goal: Adequate nutrition will be maintained Outcome: Progressing   Problem: Respiratory: Goal: Ability to maintain a clear airway and adequate ventilation will improve Outcome: Progressing

## 2023-06-19 NOTE — Progress Notes (Signed)
PHARMACY - ANTICOAGULATION CONSULT NOTE  Pharmacy Consult:  Lovenox Indication: DVT  Allergies  Allergen Reactions   Codeine     "sends me into orbit"    Patient Measurements: Height: 6\' 1"  (185.4 cm) Weight: 89.9 kg (198 lb 3.1 oz) IBW/kg (Calculated) : 79.9  Vital Signs: Temp: 99.6 F (37.6 C) (10/19 0848) Temp Source: Axillary (10/19 0848) BP: 128/55 (10/19 0645) Pulse Rate: 119 (10/19 0837)  Labs: Recent Labs    06/16/23 2250 06/17/23 0010 06/17/23 0354 06/17/23 0354 06/18/23 0338 06/18/23 0844 06/18/23 1001 06/18/23 1025 06/19/23 0308  HGB  --   --  10.0*   < > 10.3*  --   --  12.2* 9.9*  HCT  --   --  33.1*   < > 33.1*  --   --  36.0* 30.2*  PLT  --   --  146*  --  148*  --   --   --  150  APTT  --   --   --   --   --   --   --   --  31  LABPROT  --   --   --   --   --   --   --   --  14.7  INR  --   --   --   --   --   --   --   --  1.1  HEPARINUNFRC >1.10* 0.61 0.58  --   --   --   --   --   --   CREATININE  --   --  0.97  --  0.84  --   --   --  1.20  TROPONINIHS  --   --   --   --   --  21* 21*  --   --    < > = values in this interval not displayed.    Estimated Creatinine Clearance: 65.7 mL/min (by C-G formula based on SCr of 1.2 mg/dL).  Assessment: 67 y.o. M admitted with pulmonary edema.  Found to have RUE and LUE DVTs on venous duplex. Pharmacy consulted to dose Lovenox.  Renal function and CBC relatively stable; no bleeding reported.  Goal of Therapy:  Heparin level 0.3-0.7 units/ml Monitor platelets by anticoagulation protocol: Yes   Plan:  Lovenox 90mg  SQ Q12H PRN CBC Pharmacy will sign off and monitor peripherally.  Thank you for the consult!  Delrae Hagey D. Laney Potash, PharmD, BCPS, BCCCP 06/19/2023, 10:09 AM

## 2023-06-19 NOTE — Progress Notes (Addendum)
eLink Physician-Brief Progress Note Patient Name: Joshua Eaton DOB: 1956/03/05 MRN: [id number]   Date of Service  06/19/2023  HPI/Events of Note  67 year old male with a past medical history of colon cancer status post right hemicolectomy 2007, COPD, mitral valve Regurgitation who presented to [facility] on 05/20/2023 for shortness of breath.   Treated for acute heart failure in the setting of mitral valve destruction and perforation, respiratory failure with hypoxemia from multifocal pneumonia and ongoing encephalopathy.  Notified that the patient had pupillary changes at roughly 4 PM-no additional neurological deficits are noted at this time.  Moving upper and lower extremities appropriately.  Persistently encephalopathic.  Anisocoria present with the right greater than left pupil 6 mm right versus 5 mm left.  Both are reactive.  No new medications.  eICU Interventions  Primary team was notified of these changes and no intervention ordered at this time.  Agree with holding additional imaging at this time.  Continue neuromonitoring per protocol   2248 - Right arm weaker and reduced movement, some concern that he may be having new neurodeficits.  Will add CT head stat to rule out acute bleeding.  Intervention Category Minor Interventions: Clinical assessment - ordering diagnostic tests  Tenee Wish 06/19/2023, 8:20 PM

## 2023-06-19 NOTE — Progress Notes (Addendum)
Pupils still remain uneven upon this RN's assessment, with R pupil > than L pupil, both reactive. No other neurological changes were noted during assessment. Consistent with previous RN's assessment findings. Discussed these findings with [facility] MD. No new orders received at this time.   2250: concern for R sided weakness, increased BP, and restlessness- Elink MD notified, new orders received- see elink note  0030: pt transported to CT and back to [id number] without complication

## 2023-06-19 NOTE — Plan of Care (Signed)
  Problem: Nutrition: Goal: Adequate nutrition will be maintained 06/19/2023 1459 by Annice Pih, RN Outcome: Progressing   Problem: Safety: Goal: Ability to remain free from injury will improve 06/19/2023 1459 by Annice Pih, RN Outcome: Progressing   Problem: Skin Integrity: Goal: Risk for impaired skin integrity will decrease 06/19/2023 1459 by Annice Pih, RN Outcome: Progressing   Problem: Respiratory: Goal: Ability to maintain a clear airway and adequate ventilation will improve 06/19/2023 1459 by Annice Pih, RN Outcome: Progressing

## 2023-06-19 NOTE — Progress Notes (Signed)
NAME:  Joshua Eaton, MRN:  [id number], DOB:  1956/02/20, LOS: 16 ADMISSION DATE:  06/03/2023, CHIEF COMPLAINT:  Respiratory failure   History of Present Illness:   This is a 67 year old male with a past medical history of colon cancer status post right hemicolectomy 2007, COPD, mitral valve Regurgitation who presented to [facility] health on 05/20/2023 for shortness of breath. Prior to presenting, patient was feeling unwell for about 6 days with cough, shortness of breath. He had an initial CXR showing bilateral airspace opacities and was eventually placed on BiPAP and admitted to ICU. On 9/20 he was intubated due to ongoing respiratory failure and antibiotics were widened for multifocal pneumonia. He had echo performed showing EF 60-65%, severe dilated LA, severe MR. Throughout his course at [facility] had ongoing ARDS picture, developed worsening AKI. Eventually, [facility] PCCM team was consulted for transfer here due to ongoing high vent requirements, unable to wean. Additionally, there was concern over need for MV replacement and possible need for right heart cath.    Pertinent  Medical History  Colon cancer, status post hemicolectomy (right) 2007, questionable COPD wears CPAP at home, mitral valve regurgitation   Significant Hospital Events: Including procedures, antibiotic start and stop dates in addition to other pertinent events   Admitted to [facility] health on 05/20/2023 06/03/2023: Transferred from [facility] to [facility] MRSA PCR negative 06/04/2023 Tracheal aspirate no growth 10/5 failed SBT 10/06: Tracheostomy  06/08/2023 Blood culture no growth as of 06/13/2023 06/09/2023: TEE performed at bedside 10/10: Mitral valve perforation on echo. Cardiology discussed limited options Tracheal aspirate normal for 10/11: CTS consulted 10/12: GOC with family yesterday. Currently not surgical candidate. Agreed to continue current care, Good UOP with diuresis, Diarrhea unchanged, Tolerating PS C.  difficile screen negative 10/18: overnight with cuff leak, trach evaluated, re-intubated from above, trach revision  10/19: doing well on SBT, sedation switched to precedex, plan for trach collar  Interim History / Subjective:  Patient is sedated, arousable but doesn't follow commands.  Objective   Blood pressure (!) 128/55, pulse (!) 119, temperature 99.6 F (37.6 C), temperature source Axillary, resp. rate (!) 35, height 6\' 1"  (1.854 m), weight 89.9 kg, SpO2 94%.    Vent Mode: PSV;CPAP FiO2 (%):  [30 %-60 %] 30 % Set Rate:  [15 bmp] 15 bmp Vt Set:  [630 mL] 630 mL PEEP:  [5 cmH20] 5 cmH20 Pressure Support:  [5 cmH20] 5 cmH20 Plateau Pressure:  [20 cmH20-29 cmH20] 20 cmH20   Intake/Output Summary (Last 24 hours) at 06/19/2023 0939 Last data filed at 06/19/2023 0600 Gross per 24 hour  Intake 1524.62 ml  Output 500 ml  Net 1024.62 ml   Filed Weights   06/13/23 0500 06/16/23 0355 06/17/23 0409  Weight: 86.9 kg 89.2 kg 89.9 kg    Examination: Physical Exam Constitutional:      General: He is not in acute distress.    Appearance: He is ill-appearing. He is not toxic-appearing.  Cardiovascular:     Rate and Rhythm: Normal rate and regular rhythm.     Pulses: Normal pulses.     Heart sounds: Normal heart sounds.  Pulmonary:     Breath sounds: No wheezing.     Comments: Ventilated breath sounds anteriorly. Abdominal:     Palpations: Abdomen is soft.  Musculoskeletal:     Right lower leg: No edema.     Left lower leg: No edema.  Neurological:     Mental Status: He is disoriented.     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  Family has requested to minimize cardiology consults unless needed -Appreciate TCTS involvement. Not a candidate. Consider reevaluation versus transfer to tertiary center if stabilizes -Hydralazine for afterload reduction, increased 10/14 with elevated BP, blood pressure now well controlled  #Acute hypoxemic respiratory failure status post tracheostomy still requiring mechanical ventilation #Pulmonary edema secondary to HFpEF exacerbation #Severe mitral regurgitation #Aortic valve insufficiency #Multifocal pneumonia  PLAN -PRVC, tolerating PSV trials, switch to trach collar today -tracheostomy revision performed 10/18, now with 8.0 Shiley proximal XLT  -did have bloody secretions from trach today, suctioned  -held enoxaparin -VAP bundle, stress ulcer prophylaxis -s/p 7 days zosyn (end date 10/16)   Acute metabolic encephalopathy 2/2 critical illness plan -Stop Klonopin 10/14, continue oxycodone given signs of pain on exam -initiated dexmedetomidine for agitation related to care, will titrate off -CT head ordered 10/14 without significant findings to explain encephalopathy  #Fevers - RUQ neg. Resp culture with OP flora.  Blood cultures neg. Foley discontinued. Concern aspiration. ID consult. C Diff negative.  Improved with initiation of Zosyn for presumed aspiration pneumonia. -Zosyn x  7 days through 06/16/23 lead to defervescence  -UE DVT on Dopplers 10/16   # Upper extremity DVT: -- Lovenox 1 mg/KG twice daily, consider transition to DOAC once surgical planning finalized with no plans for additional invasive procedures -held yesterday, resumed today, developed bloody secretions suctioned through the trach, again held enoxaparin, will hold for 24 hours before resuming  #Macrocytic anemia #Thrombocytopenia -- Transfuse if hemoglobin 7 or less   #Severe protein calorie malnutrition  -Continue tube feeds > held for trach revision, resumed last night   #BPH Great urine output.  No  concerns. -Continue on external urinary catheter   #Hyperbilirubinemia -Resolved   #Hypernatremia: Resolved with increase free water flushes, mild recurrence much decreasing free water flush -hold free water flushes  Best Practice (right click and "Reselect all SmartList Selections" daily)   Diet/type: tubefeeds DVT prophylaxis: systemic dose LMWH GI prophylaxis: PPI Lines: Central line and yes and it is still needed Foley:  N/A Code Status:  full code Last date of multidisciplinary goals of care discussion [06/19/2023]  Labs   CBC: Recent Labs  Lab 06/15/23 0506 06/16/23 0426 06/17/23 0354 06/18/23 0338 06/18/23 1025 06/19/23 0308  WBC 8.7 6.3 6.7 7.9  --  8.4  HGB 10.3* 9.5* 10.0* 10.3* 12.2* 9.9*  HCT 34.6* 31.5* 33.1* 33.1* 36.0* 30.2*  MCV 104.5* 104.3* 104.1* 106.1*  --  102.7*  PLT 158 132* 146* 148*  --  150    Basic Metabolic Panel: Recent Labs  Lab 06/13/23 0328 06/14/23 0553 06/15/23 0506 06/15/23 1620 06/16/23 0426 06/17/23 0354 06/18/23 0338 06/18/23 1025 06/19/23 0308  NA 149* 152* 147* 148* 145 146* 143 145 139  K 4.0 4.1 3.4* 4.2 3.6 3.9 3.7 4.9 5.1  CL 106 110 106 106 107 108 105  --  104  CO2 32 33* 32 31 31 28 30   --  28  GLUCOSE 174* 160* 191* 156* 178* 171* 170*  --  134*  BUN 51* 50* 45* 49* 49* 45* 45*  --  55*  CREATININE 1.20 1.24  1.22 1.21 1.12 0.97 0.84  --  1.20  CALCIUM 8.0* 8.2* 8.5* 8.3* 8.1* 8.6* 9.0  --  8.4*  MG 3.0* 3.0* 3.0*  --   --  2.8*  --   --  3.1*  PHOS  --  2.6  --   --   --   --   --   --   --    GFR: Estimated Creatinine Clearance: 65.7 mL/min (by C-G formula based on SCr of 1.2 mg/dL). Recent Labs  Lab 06/16/23 0426 06/17/23 0354 06/18/23 0338 06/19/23 0308  WBC 6.3 6.7 7.9 8.4    Liver Function Tests: Recent Labs  Lab 06/13/23 0328 06/14/23 0553  AST 31 28  ALT 25 24  ALKPHOS 46 45  BILITOT 1.0 1.1  PROT 5.5* 5.9*  ALBUMIN 1.9* 2.0*   Recent Labs  Lab 06/13/23 0328  LIPASE 43  AMYLASE  108*   No results for input(s): "AMMONIA" in the last 168 hours.  ABG    Component Value Date/Time   PHART 7.451 (H) 06/18/2023 1025   PCO2ART 46.5 06/18/2023 1025   PO2ART 142 (H) 06/18/2023 1025   HCO3 32.3 (H) 06/18/2023 1025   TCO2 34 (H) 06/18/2023 1025   O2SAT 99 06/18/2023 1025     Coagulation Profile: Recent Labs  Lab 06/19/23 0308  INR 1.1    Cardiac Enzymes: No results for input(s): "CKTOTAL", "CKMB", "CKMBINDEX", "TROPONINI" in the last 168 hours.  HbA1C: Hgb A1c MFr Bld  Date/Time Value Ref Range Status  12/09/2015 06:30 PM 5.1 4.8 - 5.6 % Final    Comment:    (NOTE)         Pre-diabetes: 5.7 - 6.4         Diabetes: >6.4         Glycemic control for adults with diabetes: <7.0     CBG: Recent Labs  Lab 06/18/23 1537 06/18/23 1919 06/18/23 2316 06/19/23 0310 06/19/23 0845  GLUCAP 96 139* 140* 127* 145*    Review of Systems:   Unable to obtain  Past Medical History:  He,  has a past medical history of Anxiety, Cancer (HCC), Depression, Hypertension, and OCD (obsessive compulsive disorder).   Surgical History:   Past Surgical History:  Procedure Laterality Date   CHOLECYSTECTOMY     COLON SURGERY     cancerous polyp removed   HERNIA REPAIR       Social History:   reports that he has never smoked. He has never used smokeless tobacco. He reports that he does not drink alcohol and does not use drugs.   Family History:  His family history is not on file.   Allergies Allergies  Allergen Reactions   Codeine     "sends me into orbit"     Home Medications  Prior to Admission medications   Medication Sig Start Date End Date Taking? Authorizing Provider  acetaminophen (TYLENOL) 325 MG tablet Take 650 mg by mouth every 6 (six) hours as needed for moderate pain.   Yes [provider]  aspirin EC 81 MG tablet Take 81 mg by mouth daily. Swallow whole.   Yes [provider]  benzonatate (TESSALON) 200 MG capsule Take 200 mg  by mouth 3 (three) times daily as needed for cough. 05/11/23  Yes [provider]  budesonide (PULMICORT) 0.5 MG/2ML nebulizer solution Take 0.5 mg by nebulization 2 (two) times daily.   Yes [provider]  chlorhexidine (PERIDEX) 0.12 % solution Use as directed 15  mLs in the mouth or throat 2 (two) times daily.   Yes [provider]  dexmedeTOMIDine HCl (PRECEDEX IV) Inject 1 Dose into the vein every 12 (twelve) hours. in 100 mL 10.38mL/hr Q12H   Yes [provider]  feeding supplement (OSMOLITE 1 CAL) LIQD Take 1,000 mLs by mouth daily. 55mL/hr   Yes [provider]  fentaNYL citrate (PF) 10 mcg/mL in sodium chloride 0.9 % 200 mL Inject 50 mcg/hr into the vein See admin instructions. 2,540mcg in Rate: 85mL/hr Freq: Q12H   Yes [provider]  guaiFENesin-Codeine 200-20 MG/10ML SOLN Take 10 mLs by mouth every 6 (six) hours as needed (Cough).   Yes [provider]  ipratropium-albuterol (DUONEB) 0.5-2.5 (3) MG/3ML SOLN Take 3 mLs by nebulization every 6 (six) hours.   Yes [provider]  levofloxacin (LEVAQUIN) 750 MG/150ML SOLN Inject 750 mg into the vein daily. Rate: 136ml/hr Dur: 90 minutes Freq: Q24H Started 05/31/23 End 06/03/23   Yes [provider]  LORazepam (ATIVAN) 0.5 MG tablet Take 0.5 mg by mouth in the morning.   Yes [provider]  LORazepam (ATIVAN) 1 MG tablet Take 1 mg by mouth at bedtime.   Yes [provider]  LORazepam (ATIVAN) 2 MG/ML injection Inject 0.5 mg into the vein every 6 (six) hours as needed for anxiety.   Yes [provider]  methylPREDNISolone acetate (DEPO-MEDROL) 40 MG/ML injection 20 mg every 12 (twelve) hours. Started 05/29/23 End 06/12/23   Yes [provider]  METOCLOPRAMIDE HCL IJ Inject 10 mg into the vein every 6 (six) hours.   Yes [provider]  Multiple Vitamins-Minerals (MULTIVITAL PO) Take 5 mLs by mouth in  the morning and at bedtime.   Yes [provider]  OLANZapine (ZYPREXA) 2.5 MG tablet Take 1 tablet (2.5 mg total) by mouth at bedtime. 01/20/23  Yes Cottle, Steva Ready., MD  ondansetron [facility]) 4 MG/2ML SOLN injection Inject 4 mg into the vein every 6 (six) hours as needed for nausea or vomiting.   Yes [provider]  pantoprazole 40 mg in sodium chloride 0.9 % 100 mL Inject 40 mg into the vein every 12 (twelve) hours.   Yes [provider]  polyethylene glycol (MIRALAX / GLYCOLAX) 17 g packet Take 17 g by mouth 2 (two) times daily as needed for moderate constipation. Give with lunch and evening meal if no BM during the day as needed   Yes [provider]  promethazine (PHENERGAN) 25 MG/ML injection Inject 12.5 mg into the muscle every 6 (six) hours as needed for nausea or vomiting.   Yes [provider]  propofol (DIPRIVAN) 1000 MG/100ML EMUL injection Inject 2.939 mLs into the vein See admin instructions. Rate: 2.911mL/hr Dur: 34hr 2 min Freq: Every 6 hours   Yes [provider]  propranolol (INDERAL) 20 MG tablet TAKE 1 TO 2 TABLETS BY MOUTH  TWICE DAILY AS NEEDED 01/21/23  Yes Cottle, Steva Ready., MD  sennosides-docusate sodium (SENOKOT-S) 8.6-50 MG tablet Take 1 tablet by mouth 2 (two) times daily as needed for constipation.   Yes [provider]  zolpidem (AMBIEN) 5 MG tablet Take 5 mg by mouth at bedtime as needed for sleep.   Yes [provider]  doxycycline (VIBRAMYCIN) 100 MG capsule Take 100 mg by mouth 2 (two) times daily. Patient not taking: Reported on 06/05/2023 05/14/23   [provider]  gabapentin (NEURONTIN) 100 MG capsule Take 300 mg by mouth  at bedtime. Patient not taking: Reported on 06/05/2023 12/11/15   [provider]  loratadine (CLARITIN) 10 MG tablet Take 10 mg by mouth daily as needed for allergies. Patient not taking: Reported on 06/05/2023 05/11/23   [provider]   meloxicam (MOBIC) 7.5 MG tablet Take 7.5 mg by mouth 2 (two) times daily. Patient not taking: Reported on 06/05/2023 04/04/23   [provider]  methocarbamol (ROBAXIN) 500 MG tablet Take 500 mg by mouth 2 (two) times daily as needed for muscle spasms. Patient not taking: Reported on 06/05/2023 04/04/23   [provider]  predniSONE (DELTASONE) 20 MG tablet Take 10-40 mg by mouth See admin instructions. AKE 2 TABLETS (40mg ) BY MOUTH ONCE DAILY UNTIL SYMPTOMS IMPROVE, THEN DECREASE TO 1/2 TABLET (10mg ) EVERY OTHER DAY Patient not taking: Reported on 06/05/2023 05/14/23   [provider]  promethazine-dextromethorphan (PROMETHAZINE-DM) 6.25-15 MG/5ML syrup Take 5 mLs by mouth every 6 (six) hours as needed for cough. Patient not taking: Reported on 06/05/2023 05/11/23   [provider]  sertraline (ZOLOFT) 100 MG tablet TAKE 2 TABLETS BY MOUTH IN THE  MORNING AND 1 TABLET BY MOUTH IN THE EVENING 03/17/23   Cottle, Steva Ready., MD     Critical care time: 37 minutes    [name], MD [facility] 06/19/2023 4:32 PM

## 2023-06-19 NOTE — Progress Notes (Signed)
This RN noticed pupillary change during 1600 assessment. Prior to this, pupils had been 3 mm, equal, round, and reactive. During the 1600 assessment, right pupil is size 5, and left pupil is size 4. Both are round and reactive. Second RN assessed and confirmed this assessment finding. MD notified and verbalized no new orders. Will continue to monitor.

## 2023-06-20 ENCOUNTER — Inpatient Hospital Stay (HOSPITAL_COMMUNITY): Payer: Medicare Other

## 2023-06-20 DIAGNOSIS — J9601 Acute respiratory failure with hypoxia: Secondary | ICD-10-CM | POA: Diagnosis not present

## 2023-06-20 DIAGNOSIS — J810 Acute pulmonary edema: Secondary | ICD-10-CM | POA: Diagnosis not present

## 2023-06-20 DIAGNOSIS — N179 Acute kidney failure, unspecified: Secondary | ICD-10-CM | POA: Diagnosis not present

## 2023-06-20 DIAGNOSIS — I351 Nonrheumatic aortic (valve) insufficiency: Secondary | ICD-10-CM | POA: Diagnosis not present

## 2023-06-20 LAB — BASIC METABOLIC PANEL
Anion gap: 9 (ref 5–15)
BUN: 66 mg/dL — ABNORMAL HIGH (ref 8–23)
CO2: 28 mmol/L (ref 22–32)
Calcium: 8 mg/dL — ABNORMAL LOW (ref 8.9–10.3)
Chloride: 107 mmol/L (ref 98–111)
Creatinine, Ser: 0.95 mg/dL (ref 0.61–1.24)
GFR, Estimated: >60 mL/min (ref >60)
Glucose, Bld: 148 mg/dL — ABNORMAL HIGH (ref 70–99)
Potassium: 3.8 mmol/L (ref 3.5–5.1)
Sodium: 144 mmol/L (ref 135–145)

## 2023-06-20 LAB — MAGNESIUM
Magnesium: 2.9 mg/dL — ABNORMAL HIGH (ref 1.7–2.4)
Magnesium: 2.8 mg/dL — ABNORMAL HIGH (ref 1.7–2.4)

## 2023-06-20 LAB — CBC
HCT: 27.6 % — ABNORMAL LOW (ref 39.0–52.0)
Hemoglobin: 8.9 g/dL — ABNORMAL LOW (ref 13.0–17.0)
MCH: 33.1 pg (ref 26.0–34.0)
MCHC: 32.2 g/dL (ref 30.0–36.0)
MCV: 102.6 fL — ABNORMAL HIGH (ref 80.0–100.0)
Platelets: 115 10*3/uL — ABNORMAL LOW (ref 150–400)
RBC: 2.69 MIL/uL — ABNORMAL LOW (ref 4.22–5.81)
RDW: 20.1 % — ABNORMAL HIGH (ref 11.5–15.5)
WBC: 6.5 10*3/uL (ref 4.0–10.5)
nRBC: 0 % (ref 0.0–0.2)

## 2023-06-20 LAB — GLUCOSE, CAPILLARY
Glucose-Capillary: 134 mg/dL — ABNORMAL HIGH (ref 70–99)
Glucose-Capillary: 152 mg/dL — ABNORMAL HIGH (ref 70–99)
Glucose-Capillary: 121 mg/dL — ABNORMAL HIGH (ref 70–99)
Glucose-Capillary: 164 mg/dL — ABNORMAL HIGH (ref 70–99)
Glucose-Capillary: 140 mg/dL — ABNORMAL HIGH (ref 70–99)
Glucose-Capillary: 145 mg/dL — ABNORMAL HIGH (ref 70–99)

## 2023-06-20 MED ORDER — ALTEPLASE 2 MG IJ SOLR
2.0000 mg | Freq: Once | INTRAMUSCULAR | Status: AC
  Administered 2023-06-20: 2 mg
  Filled 2023-06-20 (×2): qty 2

## 2023-06-20 MED ORDER — FUROSEMIDE 10 MG/ML IJ SOLN
80.0000 mg | Freq: Once | INTRAMUSCULAR | Status: AC
  Administered 2023-06-20: 80 mg via INTRAVENOUS
  Filled 2023-06-20: qty 8

## 2023-06-20 MED ORDER — QUETIAPINE FUMARATE 100 MG PO TABS
100.0000 mg | ORAL_TABLET | Freq: Every day | ORAL | Status: DC
  Administered 2023-06-20 – 2023-06-21 (×2): 100 mg
  Filled 2023-06-20 (×2): qty 1

## 2023-06-20 MED ORDER — HYDRALAZINE HCL 20 MG/ML IJ SOLN
10.0000 mg | INTRAMUSCULAR | Status: DC | PRN
  Administered 2023-06-20 – 2023-06-24 (×5): 10 mg via INTRAVENOUS
  Filled 2023-06-20 (×7): qty 1

## 2023-06-20 MED ORDER — ALTEPLASE 2 MG IJ SOLR
2.0000 mg | Freq: Once | INTRAMUSCULAR | Status: AC
  Administered 2023-06-20: 2 mg
  Filled 2023-06-20: qty 2

## 2023-06-20 NOTE — Plan of Care (Signed)
  Problem: Clinical Measurements: Goal: Ability to maintain clinical measurements within normal limits will improve Outcome: Progressing Goal: Will remain free from infection Outcome: Progressing Goal: Diagnostic test results will improve Outcome: Progressing Goal: Respiratory complications will improve Outcome: Progressing   Problem: Nutrition: Goal: Adequate nutrition will be maintained Outcome: Progressing   Problem: Elimination: Goal: Will not experience complications related to urinary retention Outcome: Progressing   Problem: Respiratory: Goal: Ability to maintain a clear airway and adequate ventilation will improve Outcome: Progressing

## 2023-06-20 NOTE — Progress Notes (Signed)
SLP Cancellation Note  Patient Details Name: Joshua Eaton MRN: [id number] DOB: June 21, 1956   Cancelled treatment:        Pt not yet appropriate for PMV trials at this time, still requiring mechanical ventilation.  SLP will follow for medical readiness.    Kerrie Pleasure, MA, CCC-SLP Acute Rehabilitation Services Office: [phone] 06/20/2023, 9:38 AM

## 2023-06-20 NOTE — Progress Notes (Signed)
NAME:  Orion Klopf, MRN:  [id number], DOB:  08/29/1956, LOS: 17 ADMISSION DATE:  06/03/2023, CHIEF COMPLAINT:  Respiratory failure   History of Present Illness:   This is a 67 year old male with a past medical history of colon cancer status post right hemicolectomy 2007, COPD, mitral valve Regurgitation who presented to [facility] health on 05/20/2023 for shortness of breath. Prior to presenting, patient was feeling unwell for about 6 days with cough, shortness of breath. He had an initial CXR showing bilateral airspace opacities and was eventually placed on BiPAP and admitted to ICU. On 9/20 he was intubated due to ongoing respiratory failure and antibiotics were widened for multifocal pneumonia. He had echo performed showing EF 60-65%, severe dilated LA, severe MR. Throughout his course at [facility] had ongoing ARDS picture, developed worsening AKI. Eventually, [facility] PCCM team was consulted for transfer here due to ongoing high vent requirements, unable to wean. Additionally, there was concern over need for MV replacement and possible need for right heart cath.    Pertinent  Medical History  Colon cancer, status post hemicolectomy (right) 2007, questionable COPD wears CPAP at home, mitral valve regurgitation   Significant Hospital Events: Including procedures, antibiotic start and stop dates in addition to other pertinent events   Admitted to [facility] health on 05/20/2023 06/03/2023: Transferred from [facility] to [facility] MRSA PCR negative 06/04/2023 Tracheal aspirate no growth 10/5 failed SBT 10/06: Tracheostomy  06/08/2023 Blood culture no growth as of 06/13/2023 06/09/2023: TEE performed at bedside 10/10: Mitral valve perforation on echo. Cardiology discussed limited options Tracheal aspirate normal for 10/11: CTS consulted 10/12: GOC with family yesterday. Currently not surgical candidate. Agreed to continue current care, Good UOP with diuresis, Diarrhea unchanged, Tolerating PS C.  difficile screen negative 10/18: overnight with cuff leak, trach evaluated, re-intubated from above, trach revision  10/19: doing well on SBT, sedation switched to precedex, plan for trach collar 10/20: arouses off sedation, tolerated SBT, trial of trach collar  Interim History / Subjective:  Patient is sedated, arousable but doesn't follow commands.  Objective   Blood pressure (!) 133/53, pulse 76, temperature 97.7 F (36.5 C), temperature source Axillary, resp. rate 15, height 6\' 1"  (1.854 m), weight 88.9 kg, SpO2 97%.    Vent Mode: PRVC FiO2 (%):  [30 %-40 %] 40 % Set Rate:  [15 bmp] 15 bmp Vt Set:  [630 mL] 630 mL PEEP:  [5 cmH20] 5 cmH20 Plateau Pressure:  [20 cmH20-25 cmH20] 20 cmH20   Intake/Output Summary (Last 24 hours) at 06/20/2023 0752 Last data filed at 06/20/2023 0600 Gross per 24 hour  Intake 1737.14 ml  Output 1500 ml  Net 237.14 ml   Filed Weights   06/16/23 0355 06/17/23 0409 06/20/23 0500  Weight: 89.2 kg 89.9 kg 88.9 kg    Examination: Physical Exam Constitutional:      General: He is not in acute distress.    Appearance: He is ill-appearing. He is not toxic-appearing.  Cardiovascular:     Rate and Rhythm: Normal rate and regular rhythm.     Pulses: Normal pulses.     Heart sounds: Normal heart sounds.  Pulmonary:     Breath sounds: No wheezing.     Comments: Ventilated breath sounds anteriorly. Tracheostomy tube in place Abdominal:     Palpations: Abdomen is soft.  Musculoskeletal:     Right lower leg: No edema.     Left lower leg: No edema.  Neurological:     Mental Status: He is disoriented.  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 Family has requested to minimize cardiology consults unless needed -Appreciate TCTS involvement. Not a candidate. Consider reevaluation versus transfer to tertiary center if stabilizes -Hydralazine restarted overnight -IV furosemide 80 mg once today, goal neutral fluid balance  -if kidney function worsens tomorrow, will hold again  #Acute hypoxemic respiratory failure status post tracheostomy still requiring mechanical ventilation #Pulmonary edema secondary to HFpEF exacerbation #Severe mitral regurgitation #Aortic valve insufficiency #Multifocal pneumonia  PLAN -PRVC, tolerating PSV trials, tolerated trach collar but worsened respiratory status towards the end of the day. Reattempting today, will likely rest on the vent overnight. -tracheostomy revision performed 10/18, now with 8.0 Shiley proximal XLT  -did have bloody secretions from trach yesterday, suctioned  -holding enoxaparin for another 24 hours -VAP bundle, stress ulcer prophylaxis -s/p 7 days zosyn (end date 10/16)   Acute metabolic encephalopathy 2/2 critical illness plan -Stop Klonopin 10/14, continue oxycodone given signs of pain on exam -hypotensive with dexmedetomidine, discontinued, switched to propofol -CT head ordered overnight without significant findings to explain encephalopathy  #Fevers - RUQ neg. Resp culture with OP flora.  Blood cultures neg. Foley discontinued. Concern aspiration. ID consult. C Diff negative.  Improved with initiation of Zosyn for presumed aspiration pneumonia. -Zosyn x  7 days through 06/16/23 lead to defervescence  -UE DVT on Dopplers 10/16   # Upper extremity DVT: -- Lovenox 1 mg/KG twice daily, consider transition to DOAC once surgical planning finalized with no plans for additional invasive procedures -held yesterday, resumed today, developed bloody secretions suctioned through the trach, again held enoxaparin, will hold for 24 hours before  resuming  #Macrocytic anemia #Thrombocytopenia -- Transfuse if hemoglobin 7 or less   #Severe protein calorie malnutrition  -Continue tube feeds > held for trach revision, resumed last night   #BPH Great urine output.  No concerns. -Continue on external urinary catheter   #Hyperbilirubinemia -Resolved   #Hypernatremia: Resolved with increase free water flushes, mild recurrence much decreasing free water flush -hold free water flushes  Best Practice (right click and "Reselect all SmartList Selections" daily)   Diet/type: tubefeeds DVT prophylaxis: systemic dose LMWH GI prophylaxis: PPI Lines: Central line and yes and it is still needed Foley:  N/A Code Status:  full code Last date of multidisciplinary goals of care discussion [06/20/2023]  Labs   CBC: Recent Labs  Lab 06/16/23 0426 06/17/23 0354 06/18/23 0338 06/18/23 1025 06/19/23 0308 06/20/23 0312  WBC 6.3 6.7 7.9  --  8.4 6.5  HGB 9.5* 10.0* 10.3* 12.2* 9.9* 8.9*  HCT 31.5* 33.1* 33.1* 36.0* 30.2* 27.6*  MCV 104.3* 104.1* 106.1*  --  102.7* 102.6*  PLT 132* 146* 148*  --  150 115*    Basic Metabolic Panel: Recent Labs  Lab 06/14/23 0553 06/15/23 0506 06/15/23 1620 06/16/23 0426 06/17/23 0354 06/18/23 0338 06/18/23 1025 06/19/23 0308 06/19/23 1935 06/20/23 0312  NA 152* 147*   < > 145 146* 143 145 139  --  144  K 4.1 3.4*   < > 3.6 3.9 3.7 4.9 5.1  --  3.8  CL 110 106   < > 107 108 105  --  104  --  107  CO2 33* 32   < > 31 28 30   --  28  --  28  GLUCOSE 160* 191*   < > 178* 171* 170*  --  134*  --  148*  BUN 50* 45*   < > 49* 45* 45*  --  55*  --  66*  CREATININE 1.24 1.22   < > 1.12 0.97 0.84  --  1.20  --  0.95  CALCIUM 8.2* 8.5*   < > 8.1* 8.6* 9.0  --  8.4*  --  8.0*  MG 3.0* 3.0*  --   --  2.8*  --   --  3.1* 2.8* 2.9*  PHOS 2.6  --   --   --   --   --   --   --   --   --    < > = values in this interval not displayed.   GFR: Estimated Creatinine Clearance: 82.9 mL/min (by C-G formula  based on SCr of 0.95 mg/dL). Recent Labs  Lab 06/17/23 0354 06/18/23 0338 06/19/23 0308 06/19/23 1820 06/19/23 1935 06/20/23 0312  WBC 6.7 7.9 8.4  --   --  6.5  LATICACIDVEN  --   --   --  0.9 1.0  --     Liver Function Tests: Recent Labs  Lab 06/14/23 0553  AST 28  ALT 24  ALKPHOS 45  BILITOT 1.1  PROT 5.9*  ALBUMIN 2.0*   No results for input(s): "LIPASE", "AMYLASE" in the last 168 hours.  No results for input(s): "AMMONIA" in the last 168 hours.  ABG    Component Value Date/Time   PHART 7.451 (H) 06/18/2023 1025   PCO2ART 46.5 06/18/2023 1025   PO2ART 142 (H) 06/18/2023 1025   HCO3 32.3 (H) 06/18/2023 1025   TCO2 34 (H) 06/18/2023 1025   O2SAT 99 06/18/2023 1025     Coagulation Profile: Recent Labs  Lab 06/19/23 0308  INR 1.1    Cardiac Enzymes: No results for input(s): "CKTOTAL", "CKMB", "CKMBINDEX", "TROPONINI" in the last 168 hours.  HbA1C: Hgb A1c MFr Bld  Date/Time Value Ref Range Status  12/09/2015 06:30 PM 5.1 4.8 - 5.6 % Final    Comment:    (NOTE)         Pre-diabetes: 5.7 - 6.4         Diabetes: >6.4         Glycemic control for adults with diabetes: <7.0     CBG: Recent Labs  Lab 06/19/23 1523 06/19/23 1926 06/19/23 2300 06/20/23 0308 06/20/23 0722  GLUCAP 143* 120* 132* 134* 152*    Review of Systems:   Unable to obtain  Past Medical History:  He,  has a past medical history of Anxiety, Cancer (HCC), Depression, Hypertension, and OCD (obsessive compulsive disorder).   Surgical History:   Past Surgical History:  Procedure Laterality Date   CHOLECYSTECTOMY     COLON SURGERY     cancerous polyp removed   HERNIA REPAIR       Social History:   reports that he has never smoked. He has never used smokeless tobacco. He reports that he does not drink alcohol and does not use drugs.   Family History:  His family history is not on file.   Allergies Allergies  Allergen Reactions   Codeine     "sends me into orbit"      Home Medications  Prior to Admission medications   Medication Sig Start Date End Date Taking?  Authorizing Provider  acetaminophen (TYLENOL) 325 MG tablet Take 650 mg by mouth every 6 (six) hours as needed for moderate pain.   Yes [provider]  aspirin EC 81 MG tablet Take 81 mg by mouth daily. Swallow whole.   Yes [provider]  benzonatate (TESSALON) 200 MG capsule Take 200 mg by mouth 3 (three) times daily as needed for cough. 05/11/23  Yes [provider]  budesonide (PULMICORT) 0.5 MG/2ML nebulizer solution Take 0.5 mg by nebulization 2 (two) times daily.   Yes [provider]  chlorhexidine (PERIDEX) 0.12 % solution Use as directed 15 mLs in the mouth or throat 2 (two) times daily.   Yes [provider]  dexmedeTOMIDine HCl (PRECEDEX IV) Inject 1 Dose into the vein every 12 (twelve) hours. in 100 mL 10.71mL/hr Q12H   Yes [provider]  feeding supplement (OSMOLITE 1 CAL) LIQD Take 1,000 mLs by mouth daily. 13mL/hr   Yes [provider]  fentaNYL citrate (PF) 10 mcg/mL in sodium chloride 0.9 % 200 mL Inject 50 mcg/hr into the vein See admin instructions. 2,570mcg in Rate: 48mL/hr Freq: Q12H   Yes [provider]  guaiFENesin-Codeine 200-20 MG/10ML SOLN Take 10 mLs by mouth every 6 (six) hours as needed (Cough).   Yes [provider]  ipratropium-albuterol (DUONEB) 0.5-2.5 (3) MG/3ML SOLN Take 3 mLs by nebulization every 6 (six) hours.   Yes [provider]  levofloxacin (LEVAQUIN) 750 MG/150ML SOLN Inject 750 mg into the vein daily. Rate: 138ml/hr Dur: 90 minutes Freq: Q24H Started 05/31/23 End 06/03/23   Yes [provider]  LORazepam (ATIVAN) 0.5 MG tablet Take 0.5 mg by mouth in the morning.   Yes [provider]  LORazepam (ATIVAN) 1 MG tablet Take 1 mg by mouth at bedtime.   Yes [provider]  LORazepam (ATIVAN) 2 MG/ML injection Inject 0.5  mg into the vein every 6 (six) hours as needed for anxiety.   Yes [provider]  methylPREDNISolone acetate (DEPO-MEDROL) 40 MG/ML injection 20 mg every 12 (twelve) hours. Started 05/29/23 End 06/12/23   Yes [provider]  METOCLOPRAMIDE HCL IJ Inject 10 mg into the vein every 6 (six) hours.   Yes [provider]  Multiple Vitamins-Minerals (MULTIVITAL PO) Take 5 mLs by mouth in the morning and at bedtime.   Yes [provider]  OLANZapine (ZYPREXA) 2.5 MG tablet Take 1 tablet (2.5 mg total) by mouth at bedtime. 01/20/23  Yes Cottle, Steva Ready., MD  ondansetron [facility]) 4 MG/2ML SOLN injection Inject 4 mg into the vein every 6 (six) hours as needed for nausea or vomiting.   Yes [provider]  pantoprazole 40 mg in sodium chloride 0.9 % 100 mL Inject 40 mg into the vein every 12 (twelve) hours.   Yes [provider]  polyethylene glycol (MIRALAX / GLYCOLAX) 17 g packet Take 17 g by mouth 2 (two) times daily as needed for moderate constipation. Give with lunch and evening meal if no BM during the day as needed   Yes [provider]  promethazine (PHENERGAN) 25 MG/ML injection Inject 12.5 mg into the muscle every 6 (six) hours as needed for nausea or vomiting.   Yes [provider]  propofol (DIPRIVAN) 1000 MG/100ML EMUL injection Inject 2.939 mLs into the vein See admin instructions. Rate: 2.917mL/hr Dur: 34hr 2 min Freq: Every 6 hours   Yes [provider]  propranolol (INDERAL) 20 MG tablet TAKE 1  TO 2 TABLETS BY MOUTH  TWICE DAILY AS NEEDED 01/21/23  Yes Cottle, Steva Ready., MD  sennosides-docusate sodium (SENOKOT-S) 8.6-50 MG tablet Take 1 tablet by mouth 2 (two) times daily as needed for constipation.   Yes [provider]  zolpidem (AMBIEN) 5 MG tablet Take 5 mg by mouth at bedtime as needed for sleep.   Yes [provider]  doxycycline (VIBRAMYCIN) 100 MG capsule Take 100 mg by mouth 2 (two)  times daily. Patient not taking: Reported on 06/05/2023 05/14/23   [provider]  gabapentin (NEURONTIN) 100 MG capsule Take 300 mg by mouth at bedtime. Patient not taking: Reported on 06/05/2023 12/11/15   [provider]  loratadine (CLARITIN) 10 MG tablet Take 10 mg by mouth daily as needed for allergies. Patient not taking: Reported on 06/05/2023 05/11/23   [provider]  meloxicam (MOBIC) 7.5 MG tablet Take 7.5 mg by mouth 2 (two) times daily. Patient not taking: Reported on 06/05/2023 04/04/23   [provider]  methocarbamol (ROBAXIN) 500 MG tablet Take 500 mg by mouth 2 (two) times daily as needed for muscle spasms. Patient not taking: Reported on 06/05/2023 04/04/23   [provider]  predniSONE (DELTASONE) 20 MG tablet Take 10-40 mg by mouth See admin instructions. AKE 2 TABLETS (40mg ) BY MOUTH ONCE DAILY UNTIL SYMPTOMS IMPROVE, THEN DECREASE TO 1/2 TABLET (10mg ) EVERY OTHER DAY Patient not taking: Reported on 06/05/2023 05/14/23   [provider]  promethazine-dextromethorphan (PROMETHAZINE-DM) 6.25-15 MG/5ML syrup Take 5 mLs by mouth every 6 (six) hours as needed for cough. Patient not taking: Reported on 06/05/2023 05/11/23   [provider]  sertraline (ZOLOFT) 100 MG tablet TAKE 2 TABLETS BY MOUTH IN THE  MORNING AND 1 TABLET BY MOUTH IN THE EVENING 03/17/23   Cottle, Steva Ready., MD     Critical care time: 39 minutes    [name], MD [facility] 06/20/2023 5:04 PM

## 2023-06-20 NOTE — Plan of Care (Signed)
  Problem: Clinical Measurements: Goal: Will remain free from infection Outcome: Progressing Goal: Diagnostic test results will improve Outcome: Progressing Goal: Respiratory complications will improve Outcome: Progressing Goal: Cardiovascular complication will be avoided Outcome: Progressing   Problem: Nutrition: Goal: Adequate nutrition will be maintained Outcome: Progressing   Problem: Safety: Goal: Ability to remain free from injury will improve Outcome: Progressing   Problem: Activity: Goal: Risk for activity intolerance will decrease Outcome: Not Progressing   Problem: Skin Integrity: Goal: Risk for impaired skin integrity will decrease Outcome: Not Progressing

## 2023-06-21 ENCOUNTER — Inpatient Hospital Stay (HOSPITAL_COMMUNITY): Payer: Medicare Other

## 2023-06-21 ENCOUNTER — Encounter: Payer: Self-pay | Admitting: Oncology

## 2023-06-21 DIAGNOSIS — J9601 Acute respiratory failure with hypoxia: Secondary | ICD-10-CM | POA: Diagnosis not present

## 2023-06-21 LAB — CBC
HCT: 32.1 % — ABNORMAL LOW (ref 39.0–52.0)
Hemoglobin: 9.7 g/dL — ABNORMAL LOW (ref 13.0–17.0)
MCH: 31.8 pg (ref 26.0–34.0)
MCHC: 30.2 g/dL (ref 30.0–36.0)
MCV: 105.2 fL — ABNORMAL HIGH (ref 80.0–100.0)
Platelets: 136 10*3/uL — ABNORMAL LOW (ref 150–400)
RBC: 3.05 MIL/uL — ABNORMAL LOW (ref 4.22–5.81)
RDW: 20.2 % — ABNORMAL HIGH (ref 11.5–15.5)
WBC: 7.7 10*3/uL (ref 4.0–10.5)
nRBC: 0 % (ref 0.0–0.2)

## 2023-06-21 LAB — BASIC METABOLIC PANEL
Anion gap: 10 (ref 5–15)
BUN: 60 mg/dL — ABNORMAL HIGH (ref 8–23)
CO2: 30 mmol/L (ref 22–32)
Calcium: 8.9 mg/dL (ref 8.9–10.3)
Chloride: 107 mmol/L (ref 98–111)
Creatinine, Ser: 0.82 mg/dL (ref 0.61–1.24)
GFR, Estimated: >60 mL/min (ref >60)
Glucose, Bld: 166 mg/dL — ABNORMAL HIGH (ref 70–99)
Potassium: 3.7 mmol/L (ref 3.5–5.1)
Sodium: 147 mmol/L — ABNORMAL HIGH (ref 135–145)

## 2023-06-21 LAB — GLUCOSE, CAPILLARY
Glucose-Capillary: 164 mg/dL — ABNORMAL HIGH (ref 70–99)
Glucose-Capillary: 161 mg/dL — ABNORMAL HIGH (ref 70–99)
Glucose-Capillary: 164 mg/dL — ABNORMAL HIGH (ref 70–99)
Glucose-Capillary: 104 mg/dL — ABNORMAL HIGH (ref 70–99)
Glucose-Capillary: 124 mg/dL — ABNORMAL HIGH (ref 70–99)
Glucose-Capillary: 163 mg/dL — ABNORMAL HIGH (ref 70–99)
Glucose-Capillary: 131 mg/dL — ABNORMAL HIGH (ref 70–99)

## 2023-06-21 MED ORDER — CLEVIDIPINE BUTYRATE 0.5 MG/ML IV EMUL
0.0000 mg/h | INTRAVENOUS | Status: DC
  Administered 2023-06-21: 2 mg/h via INTRAVENOUS
  Filled 2023-06-21: qty 100

## 2023-06-21 MED ORDER — THIAMINE MONONITRATE 100 MG PO TABS
500.0000 mg | ORAL_TABLET | Freq: Every day | ORAL | Status: AC
  Administered 2023-06-21 – 2023-06-22 (×2): 500 mg
  Filled 2023-06-21 (×2): qty 5

## 2023-06-21 MED ORDER — FUROSEMIDE 10 MG/ML IJ SOLN
80.0000 mg | Freq: Once | INTRAMUSCULAR | Status: AC
  Administered 2023-06-21: 80 mg via INTRAVENOUS
  Filled 2023-06-21: qty 8

## 2023-06-21 MED ORDER — LORAZEPAM 0.5 MG PO TABS
0.5000 mg | ORAL_TABLET | Freq: Every day | ORAL | Status: DC
  Administered 2023-06-22: 0.5 mg
  Filled 2023-06-21: qty 1

## 2023-06-21 MED ORDER — GABAPENTIN 250 MG/5ML PO SOLN
100.0000 mg | Freq: Every day | ORAL | Status: DC
  Administered 2023-06-21 – 2023-06-25 (×5): 100 mg
  Filled 2023-06-21 (×5): qty 2

## 2023-06-21 MED ORDER — FREE WATER: 200.0000 mL | Freq: Four times a day (QID) | Status: DC

## 2023-06-21 MED ORDER — THIAMINE MONONITRATE 100 MG PO TABS
100.0000 mg | ORAL_TABLET | Freq: Every day | ORAL | Status: DC
  Administered 2023-06-23 – 2023-06-28 (×6): 100 mg
  Filled 2023-06-21 (×6): qty 1

## 2023-06-21 MED ORDER — LORAZEPAM 1 MG PO TABS
1.0000 mg | ORAL_TABLET | Freq: Once | ORAL | Status: AC
  Administered 2023-06-21: 1 mg
  Filled 2023-06-21: qty 1

## 2023-06-21 MED ORDER — METOLAZONE 2.5 MG PO TABS
5.0000 mg | ORAL_TABLET | Freq: Once | ORAL | Status: AC
  Administered 2023-06-21: 5 mg
  Filled 2023-06-21: qty 2

## 2023-06-21 MED ORDER — LORAZEPAM 1 MG PO TABS
1.0000 mg | ORAL_TABLET | Freq: Every day | ORAL | Status: DC
  Administered 2023-06-21 – 2023-06-22 (×2): 1 mg
  Filled 2023-06-21 (×2): qty 1

## 2023-06-21 MED ORDER — JEVITY 1.5 CAL/FIBER PO LIQD
1000.0000 mL | ORAL | Status: DC
  Administered 2023-06-21 – 2023-06-27 (×6): 1000 mL
  Filled 2023-06-21 (×13): qty 1000

## 2023-06-21 MED ORDER — ENOXAPARIN SODIUM 100 MG/ML IJ SOSY
90.0000 mg | PREFILLED_SYRINGE | Freq: Two times a day (BID) | INTRAMUSCULAR | Status: DC
  Administered 2023-06-21 – 2023-06-22 (×3): 90 mg via SUBCUTANEOUS
  Filled 2023-06-21 (×4): qty 0.9

## 2023-06-21 MED ORDER — METOLAZONE 2.5 MG PO TABS: 5.0000 mg | ORAL_TABLET | Freq: Once | ORAL | Status: DC

## 2023-06-21 NOTE — Progress Notes (Signed)
NAME:  Joshua Eaton, MRN:  [id number], DOB:  1956/01/03, LOS: 18 ADMISSION DATE:  06/03/2023, CHIEF COMPLAINT:  Respiratory failure   History of Present Illness:   This is a 67 year old male with a past medical history of colon cancer status post right hemicolectomy 2007, COPD, mitral valve Regurgitation who presented to [facility] health on 05/20/2023 for shortness of breath. Prior to presenting, patient was feeling unwell for about 6 days with cough, shortness of breath. He had an initial CXR showing bilateral airspace opacities and was eventually placed on BiPAP and admitted to ICU. On 9/20 he was intubated due to ongoing respiratory failure and antibiotics were widened for multifocal pneumonia. He had echo performed showing EF 60-65%, severe dilated LA, severe MR. Throughout his course at [facility] had ongoing ARDS picture, developed worsening AKI. Eventually, [facility] PCCM team was consulted for transfer here due to ongoing high vent requirements, unable to wean. Additionally, there was concern over need for MV replacement and possible need for right heart cath.    Pertinent  Medical History  Colon cancer, status post hemicolectomy (right) 2007, questionable COPD wears CPAP at home, mitral valve regurgitation   Significant Hospital Events: Including procedures, antibiotic start and stop dates in addition to other pertinent events   Admitted to [facility] health on 05/20/2023 06/03/2023: Transferred from [facility] health to [facility] MRSA PCR negative 06/04/2023 Tracheal aspirate no growth 10/5 failed SBT 10/06: Tracheostomy  06/08/2023 Blood culture no growth as of 06/13/2023 06/09/2023: TEE performed at bedside 10/10: Mitral valve perforation on echo. Cardiology discussed limited options Tracheal aspirate normal for 10/11: CTS consulted 10/12: GOC with family yesterday. Currently not surgical candidate. Agreed to continue current care, Good UOP with diuresis, Diarrhea unchanged, Tolerating PS C.  difficile screen negative 10/18: overnight with cuff leak, trach evaluated, re-intubated from above, trach revision  10/19: doing well on SBT, sedation switched to precedex, plan for trach collar 10/20: arouses off sedation, tolerated SBT, trial of trach collar 10/21: Transferred back to vent 2/2 significant secretions  Interim History / Subjective:  Patient awake off sedation but does not follow commands.  Objective   Blood pressure (!) 176/66, pulse (!) 104, temperature 98.5 F (36.9 C), temperature source Axillary, resp. rate 19, height 6\' 1"  (1.854 m), weight 92.1 kg, SpO2 95%.    Vent Mode: PSV;CPAP FiO2 (%):  [30 %-50 %] 50 % Set Rate:  [15 bmp] 15 bmp Vt Set:  [630 mL] 630 mL PEEP:  [5 cmH20] 5 cmH20 Pressure Support:  [5 cmH20] 5 cmH20 Plateau Pressure:  [20 cmH20] 20 cmH20   Intake/Output Summary (Last 24 hours) at 06/21/2023 0653 Last data filed at 06/21/2023 0600 Gross per 24 hour  Intake 1750.67 ml  Output 2350 ml  Net -599.33 ml   Filed Weights   06/17/23 0409 06/20/23 0500 06/21/23 0352  Weight: 89.9 kg 88.9 kg 92.1 kg    Examination: Constitutional: Ill-appearing elderly male.  In moderate respiratory distress HENT: Normocephalic, atraumatic,  Eyes: Sclera non-icteric, PERRL Neck: Trach collar in place with dried blood and dark mucus surrounding tracheostomy site Cardio: Tachycardic with a regular rhythm.  Very prominent systolic murmur over the apex. Pulm: Coarse breath sounds in the anterior fields Abdomen: Soft, non-tender, non-distended, positive bowel sounds. MSK: No significant lower extremity edema.  Mild right upper extremity edema Skin:Warm and dry. 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 Family has requested to minimize cardiology consults unless needed -Appreciate TCTS involvement. Not a candidate. Consider reevaluation versus transfer to tertiary center if stabilizes -Hydralazine 50 mg 3 times daily  #Acute hypoxemic respiratory failure status post tracheostomy still requiring mechanical ventilation #Pulmonary edema secondary to HFpEF exacerbation #Severe mitral regurgitation #Aortic valve insufficiency #Multifocal pneumonia  PLAN -PRVC, tolerating PSV trials, tolerated trach collar overnight but still with significant secretions and placed back on ventilator this morning.  -tracheostomy revision performed 10/18, now with 8.0 Shiley proximal XLT  -did have bloody secretions from trach yesterday, suctioned  -Resume enoxaparin -VAP bundle, stress ulcer prophylaxis -s/p 7 days zosyn (end date 10/16) -Lasix 80 mg IV and metolazone 5 mg   Acute metabolic encephalopathy 2/2 critical illness plan -Stop Klonopin 10/14, continue oxycodone given signs of pain on exam -Off sedation, still encephalopathic but awake -Resume home lorazepam 0.5 mg in the a.m. and 1 mg in the p.m. -MRI brain today  #Fevers - RUQ neg. Resp culture with OP flora.  Blood cultures neg. Foley discontinued. Concern aspiration. ID consult. C Diff negative.  Improved with initiation of Zosyn for presumed aspiration pneumonia. -Zosyn x  7 days through 06/16/23 lead to defervescence  -UE DVT on Dopplers 10/16   # Upper extremity DVT: -- Lovenox 1 mg/KG twice daily, consider transition to DOAC once surgical planning finalized with no plans for additional invasive procedures -held yesterday, resumed today, developed bloody secretions suctioned through the trach, again held enoxaparin, will hold for 24 hours before resuming  #Macrocytic anemia #Thrombocytopenia -- Transfuse if hemoglobin 7 or less   #Severe protein calorie  malnutrition  -Continue tube feeds.  Will discuss PEG tube with family today   #BPH Great urine output.  No concerns. -Continue on external urinary catheter   #Hyperbilirubinemia -Resolved   #Hypernatremia: Resolved with increase free water flushes, back up to 147 today after Lasix yesterday, no significant edema on exam -Lasix and metolazone as above  Best Practice (right click and "Reselect all SmartList Selections" daily)   Diet/type: tubefeeds DVT prophylaxis: systemic dose LMWH GI prophylaxis: PPI Lines: Central line and yes and it is still needed Foley:  N/A Code Status:  full code Last date of multidisciplinary goals of care discussion [06/21/2023]  Labs   CBC: Recent Labs  Lab 06/17/23 0354 06/18/23 0338 06/18/23 1025 06/19/23 0308 06/20/23 0312 06/21/23 0300  WBC 6.7 7.9  --  8.4 6.5 7.7  HGB 10.0* 10.3* 12.2* 9.9* 8.9* 9.7*  HCT 33.1* 33.1* 36.0* 30.2* 27.6* 32.1*  MCV 104.1* 106.1*  --  102.7* 102.6* 105.2*  PLT 146* 148*  --  150 115* 136*    Basic Metabolic Panel: Recent Labs  Lab 06/17/23 0354 06/18/23 0338 06/18/23 1025 06/19/23 0308 06/19/23 1935 06/20/23 0312 06/20/23 1711 06/21/23 0300  NA 146* 143 145 139  --  144  --  147*  K 3.9 3.7 4.9 5.1  --  3.8  --  3.7  CL 108 105  --  104  --  107  --  107  CO2 28 30  --  28  --  28  --  30  GLUCOSE 171* 170*  --  134*  --  148*  --  166*  BUN 45* 45*  --  55*  --  66*  --  60*  CREATININE 0.97 0.84  --  1.20  --  0.95  --  0.82  CALCIUM 8.6* 9.0  --  8.4*  --  8.0*  --  8.9  MG 2.8*  --   --  3.1* 2.8* 2.9* 2.8*  --    GFR: Estimated Creatinine Clearance: 96.1 mL/min (by C-G formula based on SCr of 0.82 mg/dL). Recent Labs  Lab 06/18/23 0338 06/19/23 0308 06/19/23 1820 06/19/23 1935 06/20/23 0312 06/21/23 0300  WBC 7.9 8.4  --   --  6.5 7.7  LATICACIDVEN  --   --  0.9 1.0  --   --     Liver Function Tests: No results for input(s): "AST", "ALT", "ALKPHOS", "BILITOT", "PROT",  "ALBUMIN" in the last 168 hours.  No results for input(s): "LIPASE", "AMYLASE" in the last 168 hours.  No results for input(s): "AMMONIA" in the last 168 hours.  ABG    Component Value Date/Time   PHART 7.451 (H) 06/18/2023 1025   PCO2ART 46.5 06/18/2023 1025   PO2ART 142 (H) 06/18/2023 1025   HCO3 32.3 (H) 06/18/2023 1025   TCO2 34 (H) 06/18/2023 1025   O2SAT 99 06/18/2023 1025     Coagulation Profile: Recent Labs  Lab 06/19/23 0308  INR 1.1    Cardiac Enzymes: No results for input(s): "CKTOTAL", "CKMB", "CKMBINDEX", "TROPONINI" in the last 168 hours.  HbA1C: Hgb A1c MFr Bld  Date/Time Value Ref Range Status  12/09/2015 06:30 PM 5.1 4.8 - 5.6 % Final    Comment:    (NOTE)         Pre-diabetes: 5.7 - 6.4         Diabetes: >6.4         Glycemic control for adults with diabetes: <7.0     CBG: Recent Labs  Lab 06/20/23 1130 06/20/23 1513 06/20/23 1913 06/20/23 2318 06/21/23 0320  GLUCAP 121* 164* 140* 145* 164*    Review of Systems:   Unable to obtain  Past Medical History:  He,  has a past medical history of Anxiety, Cancer (HCC), Depression, Hypertension, and OCD (obsessive compulsive disorder).   Surgical History:   Past Surgical History:  Procedure Laterality Date   CHOLECYSTECTOMY     COLON SURGERY     cancerous polyp removed   HERNIA REPAIR       Social History:   reports that he has never smoked. He has never used smokeless tobacco. He reports that he does not drink alcohol and does not use drugs.   Family History:  His family history is not on file.   Allergies Allergies  Allergen Reactions   Codeine     "sends me into orbit"     Home Medications  Prior to Admission medications   Medication Sig Start Date End Date Taking? Authorizing Provider  acetaminophen (TYLENOL) 325 MG tablet Take 650 mg by mouth every 6 (six) hours as needed for moderate pain.   Yes [provider]  aspirin EC 81 MG tablet Take 81 mg by mouth  daily. Swallow whole.   Yes [provider]  benzonatate (TESSALON) 200 MG capsule Take 200 mg by mouth 3 (three) times daily as needed for cough. 05/11/23  Yes [provider]  budesonide (PULMICORT) 0.5 MG/2ML nebulizer solution Take 0.5 mg by nebulization 2 (two) times daily.   Yes [provider]  chlorhexidine (PERIDEX) 0.12 % solution Use as directed 15 mLs in the mouth or throat 2 (two) times  daily.   Yes [provider]  dexmedeTOMIDine HCl (PRECEDEX IV) Inject 1 Dose into the vein every 12 (twelve) hours. in 100 mL 10.56mL/hr Q12H   Yes [provider]  feeding supplement (OSMOLITE 1 CAL) LIQD Take 1,000 mLs by mouth daily. 74mL/hr   Yes [provider]  fentaNYL citrate (PF) 10 mcg/mL in sodium chloride 0.9 % 200 mL Inject 50 mcg/hr into the vein See admin instructions. 2,534mcg in Rate: 38mL/hr Freq: Q12H   Yes [provider]  guaiFENesin-Codeine 200-20 MG/10ML SOLN Take 10 mLs by mouth every 6 (six) hours as needed (Cough).   Yes [provider]  ipratropium-albuterol (DUONEB) 0.5-2.5 (3) MG/3ML SOLN Take 3 mLs by nebulization every 6 (six) hours.   Yes [provider]  levofloxacin (LEVAQUIN) 750 MG/150ML SOLN Inject 750 mg into the vein daily. Rate: 146ml/hr Dur: 90 minutes Freq: Q24H Started 05/31/23 End 06/03/23   Yes [provider]  LORazepam (ATIVAN) 0.5 MG tablet Take 0.5 mg by mouth in the morning.   Yes [provider]  LORazepam (ATIVAN) 1 MG tablet Take 1 mg by mouth at bedtime.   Yes [provider]  LORazepam (ATIVAN) 2 MG/ML injection Inject 0.5 mg into the vein every 6 (six) hours as needed for anxiety.   Yes [provider]  methylPREDNISolone acetate (DEPO-MEDROL) 40 MG/ML injection 20 mg every 12 (twelve) hours. Started 05/29/23 End 06/12/23   Yes [provider]  METOCLOPRAMIDE HCL IJ Inject 10 mg into the vein every 6 (six)  hours.   Yes [provider]  Multiple Vitamins-Minerals (MULTIVITAL PO) Take 5 mLs by mouth in the morning and at bedtime.   Yes [provider]  OLANZapine (ZYPREXA) 2.5 MG tablet Take 1 tablet (2.5 mg total) by mouth at bedtime. 01/20/23  Yes Cottle, Steva Ready., MD  ondansetron [facility]) 4 MG/2ML SOLN injection Inject 4 mg into the vein every 6 (six) hours as needed for nausea or vomiting.   Yes [provider]  pantoprazole 40 mg in sodium chloride 0.9 % 100 mL Inject 40 mg into the vein every 12 (twelve) hours.   Yes [provider]  polyethylene glycol (MIRALAX / GLYCOLAX) 17 g packet Take 17 g by mouth 2 (two) times daily as needed for moderate constipation. Give with lunch and evening meal if no BM during the day as needed   Yes [provider]  promethazine (PHENERGAN) 25 MG/ML injection Inject 12.5 mg into the muscle every 6 (six) hours as needed for nausea or vomiting.   Yes [provider]  propofol (DIPRIVAN) 1000 MG/100ML EMUL injection Inject 2.939 mLs into the vein See admin instructions. Rate: 2.944mL/hr Dur: 34hr 2 min Freq: Every 6 hours   Yes [provider]  propranolol (INDERAL) 20 MG tablet TAKE 1 TO 2 TABLETS BY MOUTH  TWICE DAILY AS NEEDED 01/21/23  Yes Cottle, Steva Ready., MD  sennosides-docusate sodium (SENOKOT-S) 8.6-50 MG tablet Take 1 tablet by mouth 2 (two) times daily as needed for constipation.   Yes [provider]  zolpidem (AMBIEN) 5 MG tablet Take 5 mg by mouth at bedtime as needed for sleep.   Yes [provider]  doxycycline (VIBRAMYCIN) 100 MG capsule Take 100 mg by mouth 2 (two) times daily. Patient not taking: Reported on 06/05/2023 05/14/23   [provider]  gabapentin (NEURONTIN) 100 MG capsule Take 300 mg by mouth at bedtime. Patient not taking: Reported on 06/05/2023 12/11/15  [provider]  loratadine (CLARITIN) 10 MG tablet Take 10 mg by mouth daily as  needed for allergies. Patient not taking: Reported on 06/05/2023 05/11/23   [provider]  meloxicam (MOBIC) 7.5 MG tablet Take 7.5 mg by mouth 2 (two) times daily. Patient not taking: Reported on 06/05/2023 04/04/23   [provider]  methocarbamol (ROBAXIN) 500 MG tablet Take 500 mg by mouth 2 (two) times daily as needed for muscle spasms. Patient not taking: Reported on 06/05/2023 04/04/23   [provider]  predniSONE (DELTASONE) 20 MG tablet Take 10-40 mg by mouth See admin instructions. AKE 2 TABLETS (40mg ) BY MOUTH ONCE DAILY UNTIL SYMPTOMS IMPROVE, THEN DECREASE TO 1/2 TABLET (10mg ) EVERY OTHER DAY Patient not taking: Reported on 06/05/2023 05/14/23   [provider]  promethazine-dextromethorphan (PROMETHAZINE-DM) 6.25-15 MG/5ML syrup Take 5 mLs by mouth every 6 (six) hours as needed for cough. Patient not taking: Reported on 06/05/2023 05/11/23   [provider]  sertraline (ZOLOFT) 100 MG tablet TAKE 2 TABLETS BY MOUTH IN THE  MORNING AND 1 TABLET BY MOUTH IN THE EVENING 03/17/23   Cottle, Steva Ready., MD     Critical care time: 93 minutes    Rocky Morel, DO Internal Medicine Resident, PGY-2 Pager# [phone] [facility] 06/21/2023 6:53 AM

## 2023-06-21 NOTE — Progress Notes (Addendum)
Pt placed back on full vent support due to hypertension, tachypnea, Increased WOB, tachycardia, and pt desat to 87%. Pt tolerating well at this time, SPO2 100%, vitals stable, RN at bedside, CCM at bedside, RT will monitor as needed.      06/21/23 0802  Therapy Vitals  Pulse Rate (!) 104  Resp (!) 22  BP (!) 178/60  MEWS Score/Color  MEWS Score 4  MEWS Score Color Red  Respiratory Assessment  Assessment Type Assess only  Respiratory Pattern Regular;Tachypnea;Labored  Chest Assessment Chest expansion symmetrical  Cough Productive  Sputum Amount Moderate  Sputum Color Tan;Other (Comment) (brown)  Sputum Consistency Thick  Sputum Specimen Source Tracheostomy tube  Bilateral Breath Sounds Diminished;Rhonchi  Oxygen Therapy/Pulse Ox  O2 Device (S)  Ventilator  O2 Therapy Oxygen  FiO2 (%) 40 %  SpO2 100 %  Equipment wiped down Yes  Tracheostomy Shiley XLT Proximal 8 mm Cuffed  Placement Date/Time: 06/18/23 1355   Serial / Lot #: [id number]  Expiration Date: 07/01/26  Placed By: ICU physician  Brand: Shiley XLT Proximal  Size (mm): 8 mm  Style: Cuffed  Status Secured with trach ties and sutures  Site Assessment Crusty;Drainage  Site Care Cleansed;Dried;Dressing applied;Protective barrier to skin  Inner Cannula Care (S)  Changed/new  Ties Assessment Dry  Cuff Pressure (cm H2O) Clear OR 27-39 CmH2O  Tracheostomy Equipment at bedside Yes and checklist posted at head of bed

## 2023-06-21 NOTE — Plan of Care (Signed)
  Problem: Nutrition: Goal: Adequate nutrition will be maintained Outcome: Progressing   

## 2023-06-21 NOTE — Progress Notes (Signed)
SLP Cancellation Note  Patient Details Name: Joshua Eaton MRN: [id number] DOB: 02/18/1956   Cancelled treatment:       Reason Eval/Treat Not Completed: Patient not medically ready   Joscelin Fray, [place] 06/21/2023, 9:23 AM

## 2023-06-21 NOTE — Progress Notes (Signed)
Pt transported on vent from 2M06 to MRI, from MRI to CT and back to 2M06 without any complications. RN X 2 at bedside, RT will monitor as needed.

## 2023-06-21 NOTE — Plan of Care (Signed)
  Problem: Clinical Measurements: Goal: Will remain free from infection Outcome: Progressing   Problem: Nutrition: Goal: Adequate nutrition will be maintained Outcome: Progressing   Problem: Elimination: Goal: Will not experience complications related to bowel motility Outcome: Progressing   Problem: Pain Managment: Goal: General experience of comfort will improve Outcome: Progressing   Problem: Safety: Goal: Ability to remain free from injury will improve Outcome: Progressing   Problem: Skin Integrity: Goal: Risk for impaired skin integrity will decrease Outcome: Progressing   Problem: Role Relationship: Goal: Method of communication will improve Outcome: Progressing

## 2023-06-21 NOTE — Progress Notes (Signed)
Nutrition Follow-up  DOCUMENTATION CODES:  Not applicable  INTERVENTION:  Adjust TF to a fiber containing formula: Jevity 1.5 at 65 ml/h (1560 ml per day) Prosource TF20 60 ml 1x/d Provides 2420 kcal, 120 gm protein, 1186 ml free water daily 1 packet Juven BID, each packet provides 95 calories, 2.5 grams of protein (collagen), and 9.8 grams of carbohydrate (3 grams sugar); also contains 7 grams of L-arginine and L-glutamine, 300 mg vitamin C, 15 mg vitamin E, 1.2 mcg vitamin B-12, 9.5 mg zinc, 200 mg calcium, and 1.5 g  Calcium Beta-hydroxy-Beta-methylbutyrate to support wound healing  NUTRITION DIAGNOSIS:  Inadequate oral intake related to acute illness as evidenced by NPO status. - remains applicable  GOAL:  Patient will meet greater than or equal to 90% of their needs - progressing, being met with TF at goal  MONITOR:  Vent status, Labs, Weight trends, TF tolerance  REASON FOR ASSESSMENT:  Consult Enteral/tube feeding initiation and management  ASSESSMENT:  Pt admitted with PNA and respiratory failure. PMH significant for colon cancer s/p R hemicolectomy (2007), COPD, MVR.  9/20: intubated at [facility] 10/3: transferred to [facility] d/t high vent requirements, unable to wean, and concern for need for MVR and R heart cath 10/6 - bedside trach placed, bronchoscopy 10/9 - TEE, small bore tube pulled during procedure, cortrak placed 10/18 - bronchoscopy, trach revision  Pt resting in bed at the time of assessment. Family at bedside expresses no questions about nutrition plan at this time. TC this AM but now back on full vent support.  Pt continues to have loose stool. Will adjust to a fiber containing formula to determine if this assists with bulking stool.  Discussed in rounds, pt stable to be discharged to LTACH/ Consult placed to have PEG inserted.  Temp (24hrs), Avg:98.8 F (37.1 C), Min:98.4 F (36.9 C), Max:99 F (37.2 C)  Admit weight: 101.9 kg Current weight:  92.1 kg Weight loss noted this admission, pt aggressively diuresed and -11L which is likely the driving force of decrease in weight   Intake/Output Summary (Last 24 hours) at 06/21/2023 1244 Last data filed at 06/21/2023 1200 Gross per 24 hour  Intake 1701.88 ml  Output 2450 ml  Net -748.12 ml  Net IO Since Admission: -11,107.42 mL [06/21/23 1244]  Nutritionally Relevant Medications: Scheduled Meds:  PROSource TF20  60 mL Per Tube Daily   BANATROL TF  60 mL Per Tube TID   folic acid  1 mg Per Tube Daily   insulin aspart  2 Units Subcutaneous Q4H   insulin aspart  2-6 Units Subcutaneous Q4H   multivitamin with minerals  1 tablet Per Tube Daily   JUVEN  1 packet Per Tube BID BM   pantoprazole IV  40 mg Intravenous Q24H   Continuous Infusions:  feeding supplement (VITAL 1.5 CAL) 65 mL/hr at 06/21/23 0600   propofol (DIPRIVAN) infusion Stopped (06/20/23 1131)   PRN Meds: loperamide HCl, polyethylene glycol  Labs Reviewed: Na 147 BUN 60 CBG ranges from 120-164 mg/dL over the last 24 hours  NUTRITION - FOCUSED PHYSICAL EXAM: Flowsheet Row Most Recent Value  Orbital Region Unable to assess  Upper Arm Region Mild depletion  Thoracic and Lumbar Region No depletion  Buccal Region Unable to assess  Temple Region Severe depletion  Clavicle Bone Region Severe depletion  Clavicle and Acromion Bone Region Moderate depletion  Scapular Bone Region Unable to assess  Dorsal Hand Unable to assess  [mod edema]  Patellar Region Unable to assess  [  RLE edema]  Anterior Thigh Region Severe depletion  Posterior Calf Region Severe depletion  Edema (RD Assessment) Moderate  [moderate BLE]  Hair Reviewed  Eyes Reviewed  Mouth Unable to assess  Skin Reviewed  Nails Reviewed   Diet Order:   Diet Order             Diet NPO time specified  Diet effective now                   EDUCATION NEEDS:  No education needs have been identified at this time  Skin:  Skin Assessment: Reviewed  RN Assessment Stage 2: - sacrum (1 x 1 x 0.1 cm) - perineum (3 x 2 cm)   Last BM:  10/21 - type 7  Height:  Ht Readings from Last 1 Encounters:  06/14/23 6\' 1"  (1.854 m)   Weight:  Wt Readings from Last 1 Encounters:  06/21/23 92.1 kg   Ideal Body Weight:  83.6 kg  BMI:  Body mass index is 26.79 kg/m.  Estimated Nutritional Needs:  Kcal:  2300-2600 kcal/d Protein:  110-130g/d Fluid:  2-2.3L/d    Greig Castilla, RD, LDN Clinical Dietitian RD pager # available in [facility]  After hours/weekend pager # available in [facility]

## 2023-06-22 DIAGNOSIS — J9601 Acute respiratory failure with hypoxia: Secondary | ICD-10-CM | POA: Diagnosis not present

## 2023-06-22 LAB — BASIC METABOLIC PANEL
Anion gap: 10 (ref 5–15)
Anion gap: 8 (ref 5–15)
BUN: 60 mg/dL — ABNORMAL HIGH (ref 8–23)
BUN: 66 mg/dL — ABNORMAL HIGH (ref 8–23)
CO2: 34 mmol/L — ABNORMAL HIGH (ref 22–32)
CO2: 32 mmol/L (ref 22–32)
Calcium: 9.8 mg/dL (ref 8.9–10.3)
Calcium: 9.2 mg/dL (ref 8.9–10.3)
Chloride: 106 mmol/L (ref 98–111)
Chloride: 110 mmol/L (ref 98–111)
Creatinine, Ser: 0.87 mg/dL (ref 0.61–1.24)
Creatinine, Ser: 0.85 mg/dL (ref 0.61–1.24)
GFR, Estimated: >60 mL/min (ref >60)
GFR, Estimated: >60 mL/min (ref >60)
Glucose, Bld: 145 mg/dL — ABNORMAL HIGH (ref 70–99)
Glucose, Bld: 149 mg/dL — ABNORMAL HIGH (ref 70–99)
Potassium: 3.6 mmol/L (ref 3.5–5.1)
Potassium: 3.6 mmol/L (ref 3.5–5.1)
Sodium: 150 mmol/L — ABNORMAL HIGH (ref 135–145)
Sodium: 150 mmol/L — ABNORMAL HIGH (ref 135–145)

## 2023-06-22 LAB — CULTURE, RESPIRATORY W GRAM STAIN

## 2023-06-22 LAB — GLUCOSE, CAPILLARY
Glucose-Capillary: 141 mg/dL — ABNORMAL HIGH (ref 70–99)
Glucose-Capillary: 147 mg/dL — ABNORMAL HIGH (ref 70–99)
Glucose-Capillary: 145 mg/dL — ABNORMAL HIGH (ref 70–99)
Glucose-Capillary: 96 mg/dL (ref 70–99)
Glucose-Capillary: 144 mg/dL — ABNORMAL HIGH (ref 70–99)
Glucose-Capillary: 147 mg/dL — ABNORMAL HIGH (ref 70–99)

## 2023-06-22 LAB — MAGNESIUM: Magnesium: 2.6 mg/dL — ABNORMAL HIGH (ref 1.7–2.4)

## 2023-06-22 MED ORDER — AMLODIPINE BESYLATE 10 MG PO TABS
10.0000 mg | ORAL_TABLET | Freq: Every day | ORAL | Status: DC
  Administered 2023-06-22: 10 mg
  Filled 2023-06-22: qty 1

## 2023-06-22 MED ORDER — QUETIAPINE FUMARATE 100 MG PO TABS
100.0000 mg | ORAL_TABLET | Freq: Two times a day (BID) | ORAL | Status: DC
  Administered 2023-06-22 – 2023-06-26 (×9): 100 mg
  Filled 2023-06-22 (×10): qty 1

## 2023-06-22 MED ORDER — ENOXAPARIN SODIUM 100 MG/ML IJ SOSY
90.0000 mg | PREFILLED_SYRINGE | Freq: Two times a day (BID) | INTRAMUSCULAR | Status: DC
  Filled 2023-06-22: qty 0.9

## 2023-06-22 MED ORDER — FUROSEMIDE 10 MG/ML IJ SOLN
80.0000 mg | Freq: Once | INTRAMUSCULAR | Status: AC
  Administered 2023-06-22: 80 mg via INTRAVENOUS
  Filled 2023-06-22: qty 8

## 2023-06-22 MED ORDER — AMILORIDE HCL 5 MG PO TABS: 5.0000 mg | ORAL_TABLET | Freq: Once | ORAL | Status: DC

## 2023-06-22 MED ORDER — ACETAZOLAMIDE 250 MG PO TABS
500.0000 mg | ORAL_TABLET | Freq: Once | ORAL | Status: AC
  Administered 2023-06-22: 500 mg
  Filled 2023-06-22: qty 2

## 2023-06-22 MED ORDER — IOHEXOL 300 MG/ML  SOLN
75.0000 mL | Freq: Once | INTRAMUSCULAR | Status: DC | PRN
  Filled 2023-06-22 (×2): qty 75

## 2023-06-22 MED ORDER — METOLAZONE 2.5 MG PO TABS
5.0000 mg | ORAL_TABLET | Freq: Once | ORAL | Status: AC
  Administered 2023-06-22: 5 mg
  Filled 2023-06-22: qty 2

## 2023-06-22 MED ORDER — AMIODARONE LOAD VIA INFUSION
150.0000 mg | Freq: Once | INTRAVENOUS | Status: AC
  Administered 2023-06-22: 150 mg via INTRAVENOUS
  Filled 2023-06-22: qty 83.34

## 2023-06-22 MED ORDER — LACTATED RINGERS IV BOLUS
500.0000 mL | Freq: Once | INTRAVENOUS | Status: AC
  Administered 2023-06-22: 500 mL via INTRAVENOUS

## 2023-06-22 MED ORDER — IOHEXOL 300 MG/ML  SOLN
75.0000 mL | Freq: Once | INTRAMUSCULAR | Status: AC | PRN
  Administered 2023-06-22: 75 mL
  Filled 2023-06-22: qty 75

## 2023-06-22 MED ORDER — AMIODARONE HCL IN DEXTROSE 360-4.14 MG/200ML-% IV SOLN
60.0000 mg/h | INTRAVENOUS | Status: DC
  Administered 2023-06-22 – 2023-06-23 (×2): 60 mg/h via INTRAVENOUS
  Filled 2023-06-22 (×2): qty 200

## 2023-06-22 MED ORDER — AMIODARONE HCL IN DEXTROSE 360-4.14 MG/200ML-% IV SOLN
30.0000 mg/h | INTRAVENOUS | Status: DC
  Administered 2023-06-23 (×3): 30 mg/h via INTRAVENOUS
  Filled 2023-06-22 (×2): qty 200

## 2023-06-22 MED ORDER — POTASSIUM CHLORIDE 20 MEQ PO PACK
40.0000 meq | PACK | Freq: Four times a day (QID) | ORAL | Status: AC
  Administered 2023-06-22 (×2): 40 meq
  Filled 2023-06-22 (×2): qty 2

## 2023-06-22 NOTE — Progress Notes (Signed)
NAME:  Joshua Eaton, MRN:  [id number], DOB:  05/11/1956, LOS: 19 ADMISSION DATE:  06/03/2023, CHIEF COMPLAINT:  Respiratory failure   History of Present Illness:   This is a 67 year old male with a past medical history of colon cancer status post right hemicolectomy 2007, COPD, mitral valve Regurgitation who presented to [facility] health on 05/20/2023 for shortness of breath. Prior to presenting, patient was feeling unwell for about 6 days with cough, shortness of breath. He had an initial CXR showing bilateral airspace opacities and was eventually placed on BiPAP and admitted to ICU. On 9/20 he was intubated due to ongoing respiratory failure and antibiotics were widened for multifocal pneumonia. He had echo performed showing EF 60-65%, severe dilated LA, severe MR. Throughout his course at [facility] had ongoing ARDS picture, developed worsening AKI. Eventually, [facility] PCCM team was consulted for transfer here due to ongoing high vent requirements, unable to wean. Additionally, there was concern over need for MV replacement and possible need for right heart cath.    Pertinent  Medical History  Colon cancer, status post hemicolectomy (right) 2007, questionable COPD wears CPAP at home, mitral valve regurgitation   Significant Hospital Events: Including procedures, antibiotic start and stop dates in addition to other pertinent events   Admitted to [facility] health on 05/20/2023 06/03/2023: Transferred from [facility] to [facility] MRSA PCR negative 06/04/2023 Tracheal aspirate no growth 10/5 failed SBT 10/06: Tracheostomy  06/08/2023 Blood culture no growth as of 06/13/2023 06/09/2023: TEE performed at bedside 10/10: Mitral valve perforation on echo. Cardiology discussed limited options Tracheal aspirate normal for 10/11: CTS consulted 10/12: GOC with family yesterday. Currently not surgical candidate. Agreed to continue current care, Good UOP with diuresis, Diarrhea unchanged, Tolerating PS C.  difficile screen negative 10/18: overnight with cuff leak, trach evaluated, re-intubated from above, trach revision  10/19: doing well on SBT, sedation switched to precedex, plan for trach collar 10/20: arouses off sedation, tolerated SBT, trial of trach collar 10/21: Transferred back to vent 2/2 significant secretions  Interim History / Subjective:  Patient awake off sedation but does not follow commands and does not track with his eyes.  Stable from yesterday  Objective   Blood pressure (!) 168/56, pulse 95, temperature 99.2 F (37.3 C), temperature source Axillary, resp. rate 15, height 6' 0.99" (1.854 m), weight 86.3 kg, SpO2 100%.    Vent Mode: PRVC FiO2 (%):  [40 %] 40 % Set Rate:  [15 bmp] 15 bmp Vt Set:  [630 mL] 630 mL PEEP:  [5 cmH20] 5 cmH20 Pressure Support:  [5 cmH20] 5 cmH20 Plateau Pressure:  [17 cmH20-18 cmH20] 17 cmH20   Intake/Output Summary (Last 24 hours) at 06/22/2023 4098 Last data filed at 06/22/2023 0600 Gross per 24 hour  Intake 2135.01 ml  Output 1650 ml  Net 485.01 ml   Filed Weights   06/20/23 0500 06/21/23 0352 06/22/23 0500  Weight: 88.9 kg 92.1 kg 86.3 kg    Examination: Constitutional: Ill-appearing elderly male.   Eyes: Sclera non-icteric, PERRL Neck: Trach collar in place with dried blood and dark mucus surrounding tracheostomy site Cardio: Tachycardic with a regular rhythm.  Very prominent systolic murmur over the apex. Pulm: ventilated breath sounds bilaterally Abdomen: Soft, non-tender, non-distended, positive bowel sounds. MSK: No significant lower extremity edema.  Mild right upper extremity edema Skin:Warm and dry. Neuro: Awake but not oriented or following commands.  Does not track objects in the room.  Does not respond to painful stimuli.  Moves arms and turns  head intermittently.  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 Family has requested to minimize cardiology consults unless needed -Appreciate TCTS involvement. Not a candidate. Consider reevaluation versus transfer to tertiary center if stabilizes -Hydralazine 50 mg 3 times daily, required Cleviprex overnight but off this this morning  #Acute hypoxemic respiratory failure status post tracheostomy still requiring mechanical ventilation #Pulmonary edema secondary to HFpEF exacerbation #Severe mitral regurgitation #Aortic valve insufficiency #Multifocal pneumonia  PLAN -PRVC,  -tracheostomy revision performed 10/18, now with 8.0 Shiley proximal XLT -VAP bundle, stress ulcer prophylaxis -s/p 7 days zosyn (end date 10/16) -Diuresis as above   Acute metabolic encephalopathy 2/2 critical illness No current etiology determined.  CT and MRI of head negative for possible cause.  No known liver disease and ammonia normal during admission.  Resumed home benzodiazepine without significant change.  Started high-dose thiamine.  Electrolytes within normal limits.  However worsening metabolic alkalosis and hypernatremia but not severe, addressing today with additional diuresis. plan -continue oxycodone given signs of pain on exam -Off sedation, still encephalopathic but awake -Continue lorazepam 0.5 mg in the a.m. and 1 mg in the p.m.  #Fevers - RUQ neg. Resp culture with OP flora.  Blood cultures neg. Foley discontinued. Concern aspiration. ID consult. C Diff negative.  Improved with initiation of Zosyn for presumed aspiration pneumonia. -Zosyn x  7 days through 06/16/23 lead to defervescence  -UE DVT on Dopplers 10/16   # Upper extremity DVT: -- Lovenox 1 mg/KG twice daily, consider transition to DOAC once done with PEG procedure  #Macrocytic anemia #Thrombocytopenia --  Transfuse if hemoglobin 7 or less   #Severe protein calorie malnutrition  -Continue tube feeds.  Will discuss PEG tube with family today   #BPH Great urine output.  No concerns. -Continue on external urinary catheter   #Hyperbilirubinemia -Resolved   #Hypernatremia: Previously resolved with increase free water flushes, trending up again to 150 despite addition of metolazone to Lasix yesterday -Lasix, metolazone, and acetazolamide -Repeat BMP this afternoon and add free water flushes if not improving  Best Practice (right click and "Reselect all SmartList Selections" daily)   Diet/type: tubefeeds DVT prophylaxis: systemic dose LMWH GI prophylaxis: PPI Lines: Central line and yes and it is still needed Foley:  N/A Code Status:  full code Last date of multidisciplinary goals of care discussion [06/21/2023]  Labs   CBC: Recent Labs  Lab 06/17/23 0354 06/18/23 0338 06/18/23 1025 06/19/23 0308 06/20/23 0312 06/21/23 0300  WBC 6.7 7.9  --  8.4 6.5 7.7  HGB 10.0* 10.3* 12.2* 9.9* 8.9* 9.7*  HCT 33.1* 33.1* 36.0* 30.2* 27.6* 32.1*  MCV 104.1* 106.1*  --  102.7* 102.6* 105.2*  PLT 146* 148*  --  150 115* 136*    Basic Metabolic Panel: Recent Labs  Lab 06/17/23 0354 06/18/23 0338 06/18/23 1025 06/19/23 0308 06/19/23 1935 06/20/23 0312 06/20/23 1711 06/21/23 0300 06/22/23 0411  NA 146* 143 145 139  --  144  --  147* 150*  K 3.9 3.7 4.9 5.1  --  3.8  --  3.7 3.6  CL 108 105  --  104  --  107  --  107 106  CO2 28 30  --  28  --  28  --  30 34*  GLUCOSE 171* 170*  --  134*  --  148*  --  166* 145*  BUN 45* 45*  --  55*  --  66*  --  60* 60*  CREATININE 0.97 0.84  --  1.20  --  0.95  --  0.82 0.87  CALCIUM 8.6* 9.0  --  8.4*  --  8.0*  --  8.9 9.8  MG 2.8*  --   --  3.1* 2.8* 2.9* 2.8*  --   --    GFR: Estimated Creatinine Clearance: 90.6 mL/min (by C-G formula based on SCr of 0.87 mg/dL). Recent Labs  Lab 06/18/23 0338 06/19/23 0308 06/19/23 1820 06/19/23 1935  06/20/23 0312 06/21/23 0300  WBC 7.9 8.4  --   --  6.5 7.7  LATICACIDVEN  --   --  0.9 1.0  --   --     Liver Function Tests: No results for input(s): "AST", "ALT", "ALKPHOS", "BILITOT", "PROT", "ALBUMIN" in the last 168 hours.  No results for input(s): "LIPASE", "AMYLASE" in the last 168 hours.  No results for input(s): "AMMONIA" in the last 168 hours.  ABG    Component Value Date/Time   PHART 7.451 (H) 06/18/2023 1025   PCO2ART 46.5 06/18/2023 1025   PO2ART 142 (H) 06/18/2023 1025   HCO3 32.3 (H) 06/18/2023 1025   TCO2 34 (H) 06/18/2023 1025   O2SAT 99 06/18/2023 1025     Coagulation Profile: Recent Labs  Lab 06/19/23 0308  INR 1.1    Cardiac Enzymes: No results for input(s): "CKTOTAL", "CKMB", "CKMBINDEX", "TROPONINI" in the last 168 hours.  HbA1C: Hgb A1c MFr Bld  Date/Time Value Ref Range Status  12/09/2015 06:30 PM 5.1 4.8 - 5.6 % Final    Comment:    (NOTE)         Pre-diabetes: 5.7 - 6.4         Diabetes: >6.4         Glycemic control for adults with diabetes: <7.0     CBG: Recent Labs  Lab 06/21/23 1151 06/21/23 1603 06/21/23 1934 06/21/23 2314 06/22/23 0318  GLUCAP 104* 124* 163* 131* 141*    Review of Systems:   Unable to obtain  Past Medical History:  He,  has a past medical history of Anxiety, Cancer (HCC), Depression, Hypertension, and OCD (obsessive compulsive disorder).   Surgical History:   Past Surgical History:  Procedure Laterality Date   CHOLECYSTECTOMY     COLON SURGERY     cancerous polyp removed   HERNIA REPAIR       Social History:   reports that he has never smoked. He has never used smokeless tobacco. He reports that he does not drink alcohol and does not use drugs.   Family History:  His family history is not on file.   Allergies Allergies  Allergen Reactions   Codeine     "sends me into orbit"     Home Medications  Prior to Admission medications   Medication Sig Start Date End Date Taking?  Authorizing Provider  acetaminophen (TYLENOL) 325 MG tablet Take 650 mg by mouth every 6 (six) hours as needed for moderate pain.   Yes [provider]  aspirin EC 81 MG tablet Take 81 mg by mouth daily. Swallow whole.   Yes [provider]  benzonatate (TESSALON) 200 MG capsule Take 200 mg by mouth 3 (three) times daily as needed for cough. 05/11/23  Yes [provider]  budesonide (PULMICORT) 0.5 MG/2ML nebulizer solution Take 0.5 mg by nebulization 2 (two) times daily.   Yes [provider]  chlorhexidine (PERIDEX) 0.12 % solution Use as directed 15 mLs in the mouth or throat 2 (two) times daily.   Yes [provider]  dexmedeTOMIDine HCl (PRECEDEX IV) Inject 1 Dose into the vein every 12 (twelve) hours. in 100 mL 10.51mL/hr Q12H   Yes [provider]  feeding supplement (OSMOLITE 1 CAL) LIQD Take 1,000 mLs by mouth daily. 64mL/hr   Yes [provider]  fentaNYL citrate (PF) 10 mcg/mL in sodium chloride 0.9 % 200 mL Inject 50 mcg/hr into the vein See admin instructions. 2,569mcg in Rate: 12mL/hr Freq: Q12H   Yes [provider]  guaiFENesin-Codeine 200-20 MG/10ML SOLN Take 10 mLs by mouth every 6 (six) hours as needed (Cough).   Yes [provider]  ipratropium-albuterol (DUONEB) 0.5-2.5 (3) MG/3ML SOLN Take 3 mLs by nebulization every 6 (six) hours.   Yes [provider]  levofloxacin (LEVAQUIN) 750 MG/150ML SOLN Inject 750 mg into the vein daily. Rate: 142ml/hr Dur: 90 minutes Freq: Q24H Started 05/31/23 End 06/03/23   Yes [provider]  LORazepam (ATIVAN) 0.5 MG tablet Take 0.5 mg by mouth in the morning.   Yes [provider]  LORazepam (ATIVAN) 1 MG tablet Take 1 mg by mouth at bedtime.   Yes [provider]  LORazepam (ATIVAN) 2 MG/ML injection Inject 0.5 mg into the vein every 6 (six) hours as needed for anxiety.   Yes [provider]   methylPREDNISolone acetate (DEPO-MEDROL) 40 MG/ML injection 20 mg every 12 (twelve) hours. Started 05/29/23 End 06/12/23   Yes [provider]  METOCLOPRAMIDE HCL IJ Inject 10 mg into the vein every 6 (six) hours.   Yes [provider]  Multiple Vitamins-Minerals (MULTIVITAL PO) Take 5 mLs by mouth in the morning and at bedtime.   Yes [provider]  OLANZapine (ZYPREXA) 2.5 MG tablet Take 1 tablet (2.5 mg total) by mouth at bedtime. 01/20/23  Yes Cottle, Steva Ready., MD  ondansetron [facility]) 4 MG/2ML SOLN injection Inject 4 mg into the vein every 6 (six) hours as needed for nausea or vomiting.   Yes [provider]  pantoprazole 40 mg in sodium chloride 0.9 % 100 mL Inject 40 mg into the vein every 12 (twelve) hours.   Yes [provider]  polyethylene glycol (MIRALAX / GLYCOLAX) 17 g packet Take 17 g by mouth 2 (two) times daily as needed for moderate constipation. Give with lunch and evening meal if no BM during the day as needed   Yes [provider]  promethazine (PHENERGAN) 25 MG/ML injection Inject 12.5 mg into the muscle every 6 (six) hours as needed for nausea or vomiting.   Yes [provider]  propofol (DIPRIVAN) 1000 MG/100ML EMUL injection Inject 2.939 mLs into the vein See admin instructions. Rate: 2.92mL/hr Dur: 34hr 2 min Freq: Every 6 hours   Yes [provider]  propranolol (INDERAL) 20 MG tablet TAKE 1 TO 2 TABLETS BY MOUTH  TWICE DAILY AS NEEDED 01/21/23  Yes Cottle, Steva Ready., MD  sennosides-docusate sodium (SENOKOT-S) 8.6-50 MG tablet Take 1 tablet by mouth 2 (two) times daily as needed for constipation.   Yes [provider]  zolpidem (AMBIEN) 5 MG tablet Take 5 mg by mouth at bedtime as needed for sleep.   Yes [provider]  doxycycline (VIBRAMYCIN) 100 MG capsule Take  100 mg by mouth 2 (two) times daily. Patient not taking: Reported on 06/05/2023 05/14/23   [provider]   gabapentin (NEURONTIN) 100 MG capsule Take 300 mg by mouth at bedtime. Patient not taking: Reported on 06/05/2023 12/11/15   [provider]  loratadine (CLARITIN) 10 MG tablet Take 10 mg by mouth daily as needed for allergies. Patient not taking: Reported on 06/05/2023 05/11/23   [provider]  meloxicam (MOBIC) 7.5 MG tablet Take 7.5 mg by mouth 2 (two) times daily. Patient not taking: Reported on 06/05/2023 04/04/23   [provider]  methocarbamol (ROBAXIN) 500 MG tablet Take 500 mg by mouth 2 (two) times daily as needed for muscle spasms. Patient not taking: Reported on 06/05/2023 04/04/23   [provider]  predniSONE (DELTASONE) 20 MG tablet Take 10-40 mg by mouth See admin instructions. AKE 2 TABLETS (40mg ) BY MOUTH ONCE DAILY UNTIL SYMPTOMS IMPROVE, THEN DECREASE TO 1/2 TABLET (10mg ) EVERY OTHER DAY Patient not taking: Reported on 06/05/2023 05/14/23   [provider]  promethazine-dextromethorphan (PROMETHAZINE-DM) 6.25-15 MG/5ML syrup Take 5 mLs by mouth every 6 (six) hours as needed for cough. Patient not taking: Reported on 06/05/2023 05/11/23   [provider]  sertraline (ZOLOFT) 100 MG tablet TAKE 2 TABLETS BY MOUTH IN THE  MORNING AND 1 TABLET BY MOUTH IN THE EVENING 03/17/23   Cottle, Steva Ready., MD     Critical care time: 59 minutes    Rocky Morel, DO Internal Medicine Resident, PGY-2 Pager# [phone] [facility] 06/22/2023 6:32 AM

## 2023-06-22 NOTE — Progress Notes (Signed)
Physical Therapy Treatment Patient Details Name: Joshua Eaton MRN: [id number] DOB: 1955-12-22 Today's Date: 06/22/2023   History of Present Illness Pt is 67 yo male admitted to [facility] 9/19 with SOB, intubated 9/20. Pt with continued ARDS and AKI, 10/3 transfer to [facility]. 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 10/22 weaning from vent PMHx: colon CA s/p Rt hemicolectomy, MVR, COPD    PT Comments  RN just finishing up bathing patient. Pt noted to have spontaneous movement in all 4 extremities, eyes opening and closing but not tracking. Pt  PROM continues to be near full with no contractures noted. Some clonus in R ankle with heel cord stretch. Goals reviewed and updated. PT will follow back next week for reassessment.     If plan is discharge home, recommend the following: Two people to help with walking and/or transfers;Two people to help with bathing/dressing/bathroom;Assistance with cooking/housework;Assist for transportation   Can travel by private vehicle      No  Equipment Recommendations  Wheelchair (measurements PT);Wheelchair cushion (measurements PT);Hoyer lift;Hospital bed       Precautions / Restrictions Precautions Precautions: Fall;Other (comment) Precaution Comments: vent, trach, UB edema (on trach collar during session) Restrictions Weight Bearing Restrictions: No     Mobility  Bed Mobility Overal bed mobility: Needs Assistance             General bed mobility comments: utilized chair position for RoM, reclined afterward with propping L hip on pillow and using pilllows to prop head   Total A of 2 for any bed mobility    Transfers                   General transfer comment: unable           Cognition Arousal: Stuporous   Overall Cognitive Status: Difficult to assess                                 General Comments: Pt with trach collar, off vent,  spontaneous movement of all 4 extremities, eyes opening and closing no tracking, blinks to threat, no command follow throughout session        Exercises General Exercises - Upper Extremity Shoulder Flexion: PROM, Both, Seated, 10 reps, Standing (grimace past 100 degree, bilaterally) Shoulder Horizontal ABduction: PROM, Both, Seated, 10 reps Elbow Flexion: PROM, Both, Seated, 10 reps Elbow Extension: PROM, 10 reps, Both, Seated Wrist Flexion: PROM, Both, 10 reps, Seated Wrist Extension: PROM, Both, 10 reps, Seated Digit Composite Flexion: PROM, Both, 10 reps, Seated General Exercises - Lower Extremity Ankle Circles/Pumps: PROM, Both, 10 reps, Seated Short Arc Quad: PROM, Both, Seated, 10 reps Heel Slides: PROM, Both, 10 reps, Seated Hip ABduction/ADduction: PROM, Both, Seated, 10 reps Other Exercises Other Exercises: PROM hip IR/ER 10 reps bilaterally Other Exercises: gentle neck ROM rotation lateral flexion Other Exercises: heel cord stretch bilateral 3x 30 sec, clonus in R ankle with end range    General Comments  VSS on trach collar      Pertinent Vitals/Pain Pain Assessment Pain Assessment: Faces Faces Pain Scale: Hurts a little bit Pain Location: Grimaced end range ROM Pain Descriptors / Indicators: Grimacing Pain Intervention(s): Monitored during session     PT Goals (current goals can now be found in the care plan section) Acute Rehab PT Goals PT Goal Formulation: Patient unable to participate in goal setting Time For Goal Achievement: 07/06/23 Potential  to Achieve Goals: Poor Progress towards PT goals: Not progressing toward goals - comment (still unable to follow commands)    Frequency    Min 1X/week       AM-PAC PT "6 Clicks" Mobility   Outcome Measure  Help needed turning from your back to your side while in a flat bed without using bedrails?: Total Help needed moving from lying on your back to sitting on the side of a flat bed without using  bedrails?: Total Help needed moving to and from a bed to a chair (including a wheelchair)?: Total Help needed standing up from a chair using your arms (e.g., wheelchair or bedside chair)?: Total Help needed to walk in hospital room?: Total Help needed climbing 3-5 steps with a railing? : Total 6 Click Score: 6    End of Session   Activity Tolerance: Treatment limited secondary to medical complications (Comment) (LoC) Patient left: in bed;with bed alarm set;with SCD's reapplied (L hip propped) Nurse Communication: Mobility status PT Visit Diagnosis: Other abnormalities of gait and mobility (R26.89);Muscle weakness (generalized) (M62.81);Other symptoms and signs involving the nervous system (R29.898)     Time: 1610-9604 PT Time Calculation (min) (ACUTE ONLY): 20 min  Charges:    $Therapeutic Exercise: 8-22 mins PT General Charges $$ ACUTE PT VISIT: 1 Visit                     Timmy Cleverly B. Beverely Risen PT, DPT Acute Rehabilitation Services Please use secure chat or  Call Office [phone]    Elon Alas Fleet 06/22/2023, 2:09 PM

## 2023-06-22 NOTE — Progress Notes (Signed)
Pt placed on ATC 10l 40% by RT. Pt tolerating well at this time, no increased WOB noted. RN at bedside, CCM aware, RT will monitor as needed.     06/22/23 0956  Therapy Vitals  Pulse Rate (!) 109  Resp (!) 21  MEWS Score/Color  MEWS Score 4  MEWS Score Color Red  Respiratory Assessment  Assessment Type Assess only  Respiratory Pattern Regular;Unlabored  Chest Assessment Chest expansion symmetrical  Cough Productive  Sputum Amount Copious  Sputum Color Tan;Pink tinged (brown)  Sputum Consistency Thick  Sputum Specimen Source Tracheostomy tube  Bilateral Breath Sounds Diminished;Rhonchi  Oxygen Therapy/Pulse Ox  O2 Device (S)  Tracheostomy Collar  O2 Therapy Oxygen humidified  O2 Flow Rate (L/min) 10 L/min  FiO2 (%) 40 %  SpO2 100 %  Tracheostomy Shiley XLT Proximal 8 mm Cuffed  Placement Date/Time: 06/18/23 1355   Serial / Lot #: [id number]  Expiration Date: 07/01/26  Placed By: ICU physician  Brand: Shiley XLT Proximal  Size (mm): 8 mm  Style: Cuffed  Status Secured with trach ties and sutures  Site Assessment Oozing secretions  Site Care Cleansed;Dried;Dressing applied  Cuff Pressure (cm H2O)  (deflated)  Tracheostomy Equipment at bedside Yes and checklist posted at head of bed

## 2023-06-22 NOTE — Progress Notes (Addendum)
eLink Physician-Brief Progress Note Patient Name: Joshua Eaton DOB: 1955/11/29 MRN: [id number]   Date of Service  06/22/2023  HPI/Events of Note  Patient with atrial fibrillation with RVR, BP 81/69. Qtc 436.  eICU Interventions  Amiodarone bolus + gtt ordered.        Migdalia Dk 06/22/2023, 10:44 PM

## 2023-06-22 NOTE — Consult Note (Signed)
Chief Complaint: Patient was seen in consultation today for percutaneous gastric tube placement at the request of Dr Adaline Sill   Supervising Physician: Mir, Mauri Reading  Patient Status: [facility] - In-pt  History of Present Illness: Joshua Eaton is a 67 y.o. male   FULL Code status per wife at bedside Hx colon cancer 2007 COPD; MV Regurg To [facility] with SOB 05/20/23 Developed into Resp failure -- intubated Pneumonia Trach/ vent Metabolic encephalopathy Severe Protein Calorie Malnutirition Deconditioning  Request for percutaneous gastric tube placement Approved with IR rad (Difficult anatomy - but feel we could attempt)  Past Medical History:  Diagnosis Date   Anxiety    Cancer (HCC)    Depression    Hypertension    OCD (obsessive compulsive disorder)     Past Surgical History:  Procedure Laterality Date   CHOLECYSTECTOMY     COLON SURGERY     cancerous polyp removed   HERNIA REPAIR      Allergies: Codeine  Medications: Prior to Admission medications   Medication Sig Start Date End Date Taking? Authorizing Provider  acetaminophen (TYLENOL) 325 MG tablet Take 650 mg by mouth every 6 (six) hours as needed for moderate pain.   Yes [provider]  aspirin EC 81 MG tablet Take 81 mg by mouth daily. Swallow whole.   Yes [provider]  benzonatate (TESSALON) 200 MG capsule Take 200 mg by mouth 3 (three) times daily as needed for cough. 05/11/23  Yes [provider]  budesonide (PULMICORT) 0.5 MG/2ML nebulizer solution Take 0.5 mg by nebulization 2 (two) times daily.   Yes [provider]  chlorhexidine (PERIDEX) 0.12 % solution Use as directed 15 mLs in the mouth or throat 2 (two) times daily.   Yes [provider]  dexmedeTOMIDine HCl (PRECEDEX IV) Inject 1 Dose into the vein every 12 (twelve) hours. in 100 mL 10.36mL/hr Q12H   Yes [provider]  feeding supplement (OSMOLITE 1 CAL) LIQD Take 1,000 mLs by  mouth daily. 24mL/hr   Yes [provider]  fentaNYL citrate (PF) 10 mcg/mL in sodium chloride 0.9 % 200 mL Inject 50 mcg/hr into the vein See admin instructions. 2,540mcg in Rate: 11mL/hr Freq: Q12H   Yes [provider]  guaiFENesin-Codeine 200-20 MG/10ML SOLN Take 10 mLs by mouth every 6 (six) hours as needed (Cough).   Yes [provider]  ipratropium-albuterol (DUONEB) 0.5-2.5 (3) MG/3ML SOLN Take 3 mLs by nebulization every 6 (six) hours.   Yes [provider]  levofloxacin (LEVAQUIN) 750 MG/150ML SOLN Inject 750 mg into the vein daily. Rate: 126ml/hr Dur: 90 minutes Freq: Q24H Started 05/31/23 End 06/03/23   Yes [provider]  LORazepam (ATIVAN) 0.5 MG tablet Take 0.5 mg by mouth in the morning.   Yes [provider]  LORazepam (ATIVAN) 1 MG tablet Take 1 mg by mouth at bedtime.   Yes [provider]  LORazepam (ATIVAN) 2 MG/ML injection Inject 0.5 mg into the vein every 6 (six) hours as needed for anxiety.   Yes [provider]  methylPREDNISolone acetate (DEPO-MEDROL) 40 MG/ML injection 20 mg every 12 (twelve) hours. Started 05/29/23 End 06/12/23   Yes [provider]  METOCLOPRAMIDE HCL IJ Inject 10 mg into the vein every 6 (six) hours.   Yes [provider]  Multiple Vitamins-Minerals (MULTIVITAL PO) Take 5 mLs by mouth in the morning and at bedtime.   Yes [provider]  OLANZapine (ZYPREXA) 2.5 MG tablet  Take 1 tablet (2.5 mg total) by mouth at bedtime. 01/20/23  Yes Cottle, Steva Ready., MD  ondansetron [facility]) 4 MG/2ML SOLN injection Inject 4 mg into the vein every 6 (six) hours as needed for nausea or vomiting.   Yes [provider]  pantoprazole 40 mg in sodium chloride 0.9 % 100 mL Inject 40 mg into the vein every 12 (twelve) hours.   Yes [provider]  polyethylene glycol (MIRALAX / GLYCOLAX) 17 g packet Take 17 g by mouth 2 (two) times daily as needed  for moderate constipation. Give with lunch and evening meal if no BM during the day as needed   Yes [provider]  promethazine (PHENERGAN) 25 MG/ML injection Inject 12.5 mg into the muscle every 6 (six) hours as needed for nausea or vomiting.   Yes [provider]  propofol (DIPRIVAN) 1000 MG/100ML EMUL injection Inject 2.939 mLs into the vein See admin instructions. Rate: 2.963mL/hr Dur: 34hr 2 min Freq: Every 6 hours   Yes [provider]  propranolol (INDERAL) 20 MG tablet TAKE 1 TO 2 TABLETS BY MOUTH  TWICE DAILY AS NEEDED 01/21/23  Yes Cottle, Steva Ready., MD  sennosides-docusate sodium (SENOKOT-S) 8.6-50 MG tablet Take 1 tablet by mouth 2 (two) times daily as needed for constipation.   Yes [provider]  zolpidem (AMBIEN) 5 MG tablet Take 5 mg by mouth at bedtime as needed for sleep.   Yes [provider]  doxycycline (VIBRAMYCIN) 100 MG capsule Take 100 mg by mouth 2 (two) times daily. Patient not taking: Reported on 06/05/2023 05/14/23   [provider]  gabapentin (NEURONTIN) 100 MG capsule Take 300 mg by mouth at bedtime. Patient not taking: Reported on 06/05/2023 12/11/15   [provider]  loratadine (CLARITIN) 10 MG tablet Take 10 mg by mouth daily as needed for allergies. Patient not taking: Reported on 06/05/2023 05/11/23   [provider]  meloxicam (MOBIC) 7.5 MG tablet Take 7.5 mg by mouth 2 (two) times daily. Patient not taking: Reported on 06/05/2023 04/04/23   [provider]  methocarbamol (ROBAXIN) 500 MG tablet Take 500 mg by mouth 2 (two) times daily as needed for muscle spasms. Patient not taking: Reported on 06/05/2023 04/04/23   [provider]  predniSONE (DELTASONE) 20 MG tablet Take 10-40 mg by mouth See admin instructions. AKE 2 TABLETS (40mg ) BY MOUTH ONCE DAILY UNTIL SYMPTOMS IMPROVE, THEN DECREASE TO 1/2 TABLET (10mg ) EVERY OTHER DAY Patient not taking: Reported on 06/05/2023 05/14/23    [provider]  promethazine-dextromethorphan (PROMETHAZINE-DM) 6.25-15 MG/5ML syrup Take 5 mLs by mouth every 6 (six) hours as needed for cough. Patient not taking: Reported on 06/05/2023 05/11/23   [provider]  sertraline (ZOLOFT) 100 MG tablet TAKE 2 TABLETS BY MOUTH IN THE  MORNING AND 1 TABLET BY MOUTH IN THE EVENING 03/17/23   Cottle, Steva Ready., MD     No family history on file.  Social History   Socioeconomic History   Marital status: Married    Spouse name: Not on file   Number of children: Not on file   Years of education: Not on file   Highest education level: Not on file  Occupational History   Not on file  Tobacco Use   Smoking status: Never   Smokeless tobacco: Never  Substance and Sexual Activity   Alcohol use: No   Drug use: No   Sexual activity: Not on file  Other Topics  Concern   Not on file  Social History Narrative   Not on file   Social Determinants of Health   Financial Resource Strain: Not on file  Food Insecurity: Not on file  Transportation Needs: Not on file  Physical Activity: Not on file  Stress: Not on file  Social Connections: Not on file     Review of Systems: A 12 point ROS discussed and pertinent positives are indicated in the HPI above.  All other systems are negative.    Vital Signs: BP (!) 140/54   Pulse (!) 118   Temp 99.1 F (37.3 C) (Axillary)   Resp (!) 22   Ht 6' 0.99" (1.854 m)   Wt 190 lb 4.1 oz (86.3 kg)   SpO2 100%   BMI 25.11 kg/m   Advance Care Plan: The advanced care plan/surrogate decision maker was discussed at the time of visit and documented in the medical record.    Physical Exam HENT:     Mouth/Throat:     Mouth: Mucous membranes are moist.  Cardiovascular:     Rate and Rhythm: Normal rate and regular rhythm.     Heart sounds: Murmur heard.  Pulmonary:     Breath sounds: No wheezing.  Abdominal:     Palpations: Abdomen is soft.  Skin:    General: Skin is warm.   Neurological:     Comments: Wife at bedside No response from pt- does not follow commands  Psychiatric:     Comments: Wife consents for procedure     Imaging: MR BRAIN WO CONTRAST  Result Date: 06/21/2023 CLINICAL DATA:  Mental status change, unknown cause. History of colon cancer. EXAM: MRI HEAD WITHOUT CONTRAST TECHNIQUE: Multiplanar, multiecho pulse sequences of the brain and surrounding structures were obtained without intravenous contrast. COMPARISON:  Head CT 06/19/2023. FINDINGS: Brain: No acute infarct or hemorrhage. Mild chronic small-vessel disease. No hydrocephalus or extra-axial collection. No mass or midline shift. No foci of abnormal susceptibility. Vascular: Normal flow voids. Skull and upper cervical spine: Normal marrow signal. Sinuses/Orbits: No acute findings. Trace fluid signal in the bilateral mastoid air cells. Other: None. IMPRESSION: No acute intracranial process. Electronically Signed   By: Orvan Falconer M.D.   On: 06/21/2023 18:50   CT ABDOMEN WO CONTRAST  Result Date: 06/21/2023 CLINICAL DATA:  dysphagia.  Percutaneous gastrostomy evaluation. EXAM: CT ABDOMEN WITHOUT CONTRAST TECHNIQUE: Multidetector CT imaging of the abdomen was performed following the standard protocol without IV contrast. RADIATION DOSE REDUCTION: This exam was performed according to the departmental dose-optimization program which includes automated exposure control, adjustment of the mA and/or kV according to patient size and/or use of iterative reconstruction technique. COMPARISON:  CT AP, 12/10/2005. Chest XR, 06/10/2023. CT chest, 06/15/2023. FINDINGS: Lower chest: Moderate-to-moderate burden of coronary atherosclerosis and borderline cardiac enlargement. Bibasilar pulmonary interstitial thickening and dependent pulmonary consolidations. Hepatobiliary: No focal liver abnormality. cholecystectomy. No biliary dilatation. Pancreas: No pancreatic ductal dilatation or surrounding inflammatory  changes. Spleen: Normal in size without focal abnormality. Small perihilar accessory spleen. Adrenals/Urinary Tract: Adrenal glands are unremarkable. Kidneys are normal, without renal calculi, focal lesion, or hydronephrosis. Stomach/Bowel: Small bore enteric feeding tube, with catheter tip at the gastric antrum. Stomach is decompressed and within normal limits. Appendix is surgically absent. Postsurgical changes of RIGHT hemicolectomy with intact-appearing anastomosis. Nonobstructed small bowel. Nondilated colon. No evidence of bowel wall thickening, distention, or inflammatory changes. Vascular/Lymphatic: Aortic atherosclerosis. No enlarged abdominal lymph nodes. Other: Small fat-containing umbilical hernia. Small RIGHT anterior abdominal wall subcutaneous  contusion, likely injection site. Musculoskeletal: Degenerative changes of the spine. No acute or significant osseous findings. IMPRESSION: 1. Intervening viscera with the splenic flexure and distal transverse colon overlying the gastric body and antrum. Anatomy is equivocal for percutaneous gastrostomy placement, with a trial of contrast opacification of colon and insufflation recommended. Final decision making is reserved for the performing physician. 2. No acute abdominal process. incidental, chronic and senescent findings as above. Roanna Banning, MD [facility] Radiology Electronically Signed   By: Roanna Banning M.D.   On: 06/21/2023 16:24   DG CHEST PORT 1 VIEW  Result Date: 06/20/2023 CLINICAL DATA:  Dyspnea EXAM: PORTABLE CHEST 1 VIEW COMPARISON:  06/19/2023 FINDINGS: Tracheostomy tip noted 11 mm above the carina. Nasoenteric feeding tube tip overlies the expected mid to distal body of the stomach. Right upper extremity PICC line tip noted at the superior cavoatrial junction. Lung volumes are small, but are symmetric and are stable since prior examination. Bibasilar pulmonary infiltrates have improved in  the interval since prior examination in keeping with a resolving infectious or inflammatory process. No pneumothorax or pleural effusion. Cardiac size within normal limits. The thoracic aorta is tortuous and ectatic, better assessed on prior CT examination of 06/15/2023. No acute bone abnormality. IMPRESSION: 1. Support lines and tubes as outlined above. 2. Pulmonary hypoinflation. 3. Improving bibasilar pulmonary infiltrates. Electronically Signed   By: Helyn Numbers M.D.   On: 06/20/2023 16:23   CT HEAD WO CONTRAST ( )  Result Date: 06/20/2023 CLINICAL DATA:  Right-sided weakness, anisocoria, stroke suspected EXAM: CT HEAD WITHOUT CONTRAST TECHNIQUE: Contiguous axial images were obtained from the base of the skull through the vertex without intravenous contrast. RADIATION DOSE REDUCTION: This exam was performed according to the departmental dose-optimization program which includes automated exposure control, adjustment of the mA and/or kV according to patient size and/or use of iterative reconstruction technique. COMPARISON:  06/15/2023 FINDINGS: Brain: No evidence of acute infarction, hemorrhage, mass, mass effect, or midline shift. No hydrocephalus or extra-axial fluid collection. Vascular: No hyperdense vessel. Skull: Negative for fracture or focal lesion. Sinuses/Orbits: No acute finding. Other: Fluid in the left-greater-than-right mastoid air cells. IMPRESSION: No acute intracranial process. Electronically Signed   By: Wiliam Ke M.D.   On: 06/20/2023 01:48   DG CHEST PORT 1 VIEW  Result Date: 06/19/2023 CLINICAL DATA:  Dyspnea. EXAM: PORTABLE CHEST 1 VIEW COMPARISON:  06/18/2023 FINDINGS: Support apparatus remains in appropriate position. Low lung volumes are again seen. Bibasilar predominant airspace opacity shows no significant change. IMPRESSION: No significant change in low lung volumes and bibasilar airspace disease. Electronically Signed   By: Danae Orleans M.D.   On: 06/19/2023 15:39    DG Chest Port 1 View  Result Date: 06/18/2023 CLINICAL DATA:  Status post tracheostomy EXAM: PORTABLE CHEST 1 VIEW COMPARISON:  Chest x-ray dated June 18, 2023 FINDINGS: Cardiac and mediastinal contours are unchanged. Unchanged position of right arm PICC. Interval placement of tracheostomy tube. Feeding tube partially seen coursing below the diaphragm. Unchanged bibasilar consolidations. No evidence of pneumothorax. No large pleural effusion. IMPRESSION: 1. Interval placement of tracheostomy tube. 2. Unchanged bibasilar consolidations, concerning for infection or aspiration. Electronically Signed   By: Allegra Lai M.D.   On: 06/18/2023 15:58   Portable Chest x-ray  Result Date: 06/18/2023 CLINICAL DATA:  [id number].  Ventilator dependent respiratory failure. EXAM: PORTABLE CHEST 1 VIEW COMPARISON:  Chest CT without contrast 06/15/2023 FINDINGS: 6:19 a.m. Interval removal of tracheostomy cannula with interval intubation. Tip  of the ETT is only 3 mm above the carina and just above the orifice of the right main bronchus and should be withdrawn 4 cm to a mid tracheal position. Feeding tube with weighted tip again terminates either in the proximal gastric antrum or distal body of stomach. Right PICC tip remains in the distal SVC. There is continued patchy consolidation in the lower lung fields with small pleural effusions. Mild central interstitial edema. The heart is enlarged. Mediastinum is stable with aortic tortuosity and calcification. Mild central vascular prominence. Multiple overlying monitor wires. No acute osseous findings. Stable overall aeration. IMPRESSION: 1. ETT tip is only 3 mm above the carina and should be withdrawn 4 cm to a mid tracheal position. 2. Other support apparatus stable. 3. Stable cardiomegaly with central vascular prominence and mild central interstitial edema. 4. Stable patchy consolidation in the lower lung fields with small pleural effusions. Electronically Signed   By:  Almira Bar M.D.   On: 06/18/2023 06:49   VAS [place] LOWER EXTREMITY VENOUS (DVT)  Result Date: 06/16/2023  Lower Venous DVT Study Patient Name:  TAHMIR ZUMBA  Date of Exam:   06/16/2023 Medical Rec #: [id number]   Accession #:    [id number] Date of Birth: [birth year]    Patient Gender: M Patient Age:   22 years Exam Location:  [facility] Procedure:      VAS [place] LOWER EXTREMITY VENOUS (DVT) Referring Phys: CHI ELLISON --------------------------------------------------------------------------------  Indications: Fever. Other Indications: Critically ill patient, prolonged immobility. Limitations: Poor ultrasound/tissue interface and patient movement. Comparison Study: Previous exam at [facility] on 03/07/2018 was negative for DVT Performing Technologist: Ernestene Mention RVT, RDMS  Examination Guidelines: A complete evaluation includes B-mode imaging, spectral Doppler, color Doppler, and power Doppler as needed of all accessible portions of each vessel. Bilateral testing is considered an integral part of a complete examination. Limited examinations for reoccurring indications may be performed as noted. The reflux portion of the exam is performed with the patient in reverse Trendelenburg.  +---------+---------------+---------+-----------+----------+-------------------+ RIGHT    CompressibilityPhasicitySpontaneityPropertiesThrombus Aging      +---------+---------------+---------+-----------+----------+-------------------+ CFV      Full           No       Yes                                      +---------+---------------+---------+-----------+----------+-------------------+ SFJ      Full                                                             +---------+---------------+---------+-----------+----------+-------------------+ FV Prox  Full           Yes      Yes                                      +---------+---------------+---------+-----------+----------+-------------------+ FV Mid   Full            Yes      Yes                                      +---------+---------------+---------+-----------+----------+-------------------+  FV DistalFull           Yes      Yes                                      +---------+---------------+---------+-----------+----------+-------------------+ PFV      Full                                                             +---------+---------------+---------+-----------+----------+-------------------+ POP      Full           Yes      Yes                                      +---------+---------------+---------+-----------+----------+-------------------+ PTV      Full                                         Not well visualized +---------+---------------+---------+-----------+----------+-------------------+ PERO     Full                                         Not well visualized +---------+---------------+---------+-----------+----------+-------------------+   +---------+---------------+---------+-----------+----------+-------------------+ LEFT     CompressibilityPhasicitySpontaneityPropertiesThrombus Aging      +---------+---------------+---------+-----------+----------+-------------------+ CFV      Full           Yes      Yes                                      +---------+---------------+---------+-----------+----------+-------------------+ SFJ      Full                                                             +---------+---------------+---------+-----------+----------+-------------------+ FV Prox  Full           Yes      Yes                                      +---------+---------------+---------+-----------+----------+-------------------+ FV Mid   Full           Yes      Yes                                      +---------+---------------+---------+-----------+----------+-------------------+ FV DistalFull           Yes      Yes                                       +---------+---------------+---------+-----------+----------+-------------------+  PFV      Full                                                             +---------+---------------+---------+-----------+----------+-------------------+ POP      Full           Yes      Yes                                      +---------+---------------+---------+-----------+----------+-------------------+ PTV      Full                                         Not well visualized +---------+---------------+---------+-----------+----------+-------------------+ PERO     Full                                         Not well visualized +---------+---------------+---------+-----------+----------+-------------------+     Summary: BILATERAL: - No evidence of deep vein thrombosis seen in the lower extremities, bilaterally. -No evidence of popliteal cyst, bilaterally.   *See table(s) above for measurements and observations. Electronically signed by Coral Else MD on 06/16/2023 at 7:20:23 PM.    Final    VAS [place] UPPER EXTREMITY VENOUS DUPLEX  Result Date: 06/16/2023 UPPER VENOUS STUDY  Patient Name:  ITSUO PACIFICO  Date of Exam:   06/16/2023 Medical Rec #: [id number]   Accession #:    [id number] Date of Birth: Jun 17, 1956    Patient Gender: M Patient Age:   46 years Exam Location:  [facility] Procedure:      VAS [place] UPPER EXTREMITY VENOUS DUPLEX Referring Phys: CHI ELLISON --------------------------------------------------------------------------------  Indications: Fever Other Indications: Critically ill patient, BUE IV/line placements. Limitations: PICC line placement (RUE) and continuous patient movement/agitation. Comparison Study: No previous exams Performing Technologist: Jody Hill RVT, RDMS  Examination Guidelines: A complete evaluation includes B-mode imaging, spectral Doppler, color Doppler, and power Doppler as needed of all accessible portions of each vessel. Bilateral testing is considered an  integral part of a complete examination. Limited examinations for reoccurring indications may be performed as noted.  Right Findings: +----------+------------+---------+-----------+----------+-------+ RIGHT     CompressiblePhasicitySpontaneousPropertiesSummary +----------+------------+---------+-----------+----------+-------+ IJV           Full       Yes       Yes                      +----------+------------+---------+-----------+----------+-------+ Subclavian    Full       Yes       Yes                      +----------+------------+---------+-----------+----------+-------+ Axillary    Partial      Yes       Yes               Acute  +----------+------------+---------+-----------+----------+-------+ Brachial      Full                                          +----------+------------+---------+-----------+----------+-------+  Radial        Full                                          +----------+------------+---------+-----------+----------+-------+ Ulnar         Full                                          +----------+------------+---------+-----------+----------+-------+ Cephalic      None       No        No                Acute  +----------+------------+---------+-----------+----------+-------+ Basilic       Full                                          +----------+------------+---------+-----------+----------+-------+ RUE limited due to patient movement/agitation.  Left Findings: +----------+------------+---------+-----------+----------+-------+ LEFT      CompressiblePhasicitySpontaneousPropertiesSummary +----------+------------+---------+-----------+----------+-------+ IJV           Full       Yes       Yes                      +----------+------------+---------+-----------+----------+-------+ Subclavian    Full       Yes       Yes                      +----------+------------+---------+-----------+----------+-------+ Axillary       Full       Yes       Yes                      +----------+------------+---------+-----------+----------+-------+ Brachial      Full       Yes       Yes                      +----------+------------+---------+-----------+----------+-------+ Radial        Full                                          +----------+------------+---------+-----------+----------+-------+ Ulnar         Full                                          +----------+------------+---------+-----------+----------+-------+ Cephalic      None       No        No                Acute  +----------+------------+---------+-----------+----------+-------+ Basilic       Full       Yes       Yes                      +----------+------------+---------+-----------+----------+-------+  Summary:  Right: Findings consistent with acute deep vein thrombosis involving the right axillary vein. Findings consistent with acute superficial vein thrombosis involving the right  cephalic vein.  Left: Findings consistent with acute superficial vein thrombosis involving the left cephalic vein.  *See table(s) above for measurements and observations.  Diagnosing physician: Coral Else MD Electronically signed by Coral Else MD on 06/16/2023 at 7:19:36 PM.    Final    CT HEAD WO CONTRAST ( )  Result Date: 06/15/2023 CLINICAL DATA:  Inpatient with encephalopathy. EXAM: CT HEAD WITHOUT CONTRAST TECHNIQUE: Contiguous axial images were obtained from the base of the skull through the vertex without intravenous contrast. RADIATION DOSE REDUCTION: This exam was performed according to the departmental dose-optimization program which includes automated exposure control, adjustment of the mA and/or kV according to patient size and/or use of iterative reconstruction technique. COMPARISON:  Head CT 05/21/2023 FINDINGS: Brain: There is mild cerebral atrophy and small-vessel disease, unremarkable cerebellum and brainstem. No asymmetry is  seen concerning for a cortical based acute infarct, hemorrhage, mass or mass effect. There is no midline shift. The ventricles are normal in size and position. Basal cisterns are patent. Vascular: There are patchy calcifications in both carotid siphons. There are no hyperdense central vessels. Skull: Negative for fractures or focal lesions. Sinuses/Orbits: Clear paranasal sinuses. Negative visible orbits. Interval nasoenteric intubation. There is new patchy fluid in the left mastoid air cells, trace fluid in the right mastoid tip also new. Both middle ear cavities are clear. Other: None. IMPRESSION: 1. No acute intracranial CT findings or intracranial interval changes. 2. New patchy fluid in the left mastoid air cells and trace fluid in the right mastoid tip. 3. Interval nasoenteric intubation. Electronically Signed   By: Almira Bar M.D.   On: 06/15/2023 07:23   CT CHEST WO CONTRAST  Result Date: 06/15/2023 CLINICAL DATA:  Pulmonary venous congestion versus multifocal infection on chest x-rays. EXAM: CT CHEST WITHOUT CONTRAST TECHNIQUE: Multidetector CT imaging of the chest was performed following the standard protocol without IV contrast. RADIATION DOSE REDUCTION: This exam was performed according to the departmental dose-optimization program which includes automated exposure control, adjustment of the mA and/or kV according to patient size and/or use of iterative reconstruction technique. COMPARISON:  Portable chest 06/12/2023, portable chest 06/09/2023, chest CT without contrast 05/31/2023, CTA chest 05/20/2023. FINDINGS: Cardiovascular: There is mild cardiomegaly with a left chamber predominance. Scattered two-vessel coronary calcific plaques in the circumflex and LAD. There is no substantial pericardial effusion. Scattered calcific plaques noted in the aorta and great vessels but no aortic aneurysm. Mild aortic tortuosity. Central pulmonary veins are slightly prominent. Pulmonary arteries are normal  caliber. A right PICC terminates in the distal SVC. Mediastinum/Nodes: Assessment for hilar adenopathy is difficult without contrast but there is no contour deforming abnormality. Tracheostomy cannula terminates 2 cm above the carina. The trachea is clear below the cannula but above the cannula there are moderate layering secretions in the distal cervical trachea. There is a feeding tube in the esophagus with the tip in the distal gastric antrum. No thyroid nodule is seen, no intrathoracic, axillary or supraclavicular adenopathy. Lungs/Pleura: There are minimal layering pleural effusions. There is subpleural septal thickening in the lung fields consistent with interstitial edema, improved compared with 05/31/2023. There is confluent consolidation with air bronchograms in the bilateral lower lobes primarily the basilar segments which is probably due to pneumonia or aspiration. This is similar to the prior study. Patchy hazy airspace disease is noted in the bilateral upper and right middle lobes where the prior CT demonstrated patchy dense airspace disease. This is probably either ground-glass edema or ground-glass pneumonitis. Upper Abdomen: No acute  abnormality. No splenomegaly with AP splenic dimension 16.5 cm. Musculoskeletal: No chest wall mass or suspicious bone lesions identified. IMPRESSION: 1. Cardiomegaly with slight prominence of the central pulmonary veins and minimal layering pleural effusions. 2. Interstitial edema in the lung fields, improved compared with 05/31/2023. 3. Confluent consolidation with air bronchograms in the bilateral lower lobes primarily the basilar segments, probably due to pneumonia or aspiration. This is similar to the prior CT. 4. Patchy hazy opacities in the bilateral upper and right middle lobes where the prior CT demonstrated denser airspace disease. This is probably either ground-glass edema or ground-glass pneumonitis. 5. Aortic and coronary artery atherosclerosis. 6.  Tracheostomy cannula and feeding tube. 7. Moderate layering secretions in the distal cervical trachea above the tracheostomy. 8. Splenomegaly. Electronically Signed   By: Almira Bar M.D.   On: 06/15/2023 07:14   DG CHEST PORT 1 VIEW  Result Date: 06/12/2023 CLINICAL DATA:  [id number] Hypoxia [id number] EXAM: PORTABLE CHEST 1 VIEW COMPARISON:  CXR 06/09/23 FINDINGS: Tracheostomy in place. Weighted enteric tube courses below diaphragm with tip positioned near the pylorus. Tracheostomy in place. Right arm PICC with the tip in the lower SVC. No pleural effusion. No pneumothorax. Cardiomegaly. Persistent prominent bilateral interstitial opacities that could represent pulmonary venous congestion or multifocal infection. No radiographically apparent displaced rib fractures. Visualized upper abdomen is unremarkable IMPRESSION: 1. Interval placement of a right arm PICC with the tip positioned in the lower SVC. Additional support apparatus as above. 2. Otherwise no significant change from prior exam with persistent prominent bilateral interstitial opacities. Electronically Signed   By: Lorenza Cambridge M.D.   On: 06/12/2023 11:15   US Abdomen Limited RUQ (LIVER/GB)  Result Date: 06/10/2023 CLINICAL DATA:  Fever EXAM: ULTRASOUND ABDOMEN LIMITED RIGHT UPPER QUADRANT COMPARISON:  X-ray 06/09/2023 FINDINGS: Gallbladder: Gallbladder not seen.  Please correlate with surgical history. Common bile duct: Diameter: 4 mm Liver: Mildly increased echogenic hepatic parenchyma consistent with a component of fatty infiltration. Portal vein is patent on color Doppler imaging with normal direction of blood flow towards the liver. Other: Limited by overlapping bowel gas and soft tissue. IMPRESSION: Slight fatty liver infiltration.  No ductal dilatation. Gallbladder not seen.  Please correlate for any history of surgery Electronically Signed   By: Karen Kays M.D.   On: 06/10/2023 19:53   ECHO TEE  Result Date: 06/09/2023     TRANSESOPHOGEAL ECHO REPORT   Patient Name:   KIMAR SALEE Date of Exam: 06/09/2023 Medical Rec #:  [id number]  Height:       73.0 in Accession #:    [id number] Weight:       205.9 lb Date of Birth:  [birth year]   BSA:          2.178 m Patient Age:    67 years   BP:           116/40 mmHg Patient Gender: M          HR:           52 bpm. Exam Location:  Inpatient Procedure: Transesophageal Echo Indications:     mitral regurgitation  History:         Patient has prior history of Echocardiogram examinations, most                  recent 06/04/2023. COPD.  Sonographer:     Delcie Roch RDCS Referring Phys:  [id number] Perlie Gold Diagnosing Phys: Chilton Si MD  Sonographer Comments: Echo performed with patient supine and  on artificial respirator. PROCEDURE: After discussion of the risks and benefits of a TEE, an informed consent was obtained from a family member. The patient was intubated. The transesophogeal probe was passed without difficulty through the esophogus of the patient. Imaged were obtained with the patient in a left lateral decubitus position. Sedation performed by performing physician. The patient's vital signs; including heart rate, blood pressure, and oxygen saturation; remained stable throughout the procedure. The patient developed no complications during the procedure.  IMPRESSIONS  1. Images were technically challenging due to patient movement and movement/impedence from the NG tube.  2. Left ventricular ejection fraction, by estimation, is 60 to 65%. The left ventricle has normal function. The left ventricle has no regional wall motion abnormalities.  3. Right ventricular systolic function is normal. The right ventricular size is normal.  4. No left atrial/left atrial appendage thrombus was detected.  5. Eccentric mitral regurgitation with Coanda effect. There is no pulmonary vein systolic reversal. There is at least moderate mitral regurgitation, likely severe. 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 The mitral valve is normal in  structure. No evidence of mitral valve regurgitation. No evidence of mitral stenosis. There is severe prolapse of the middle segment of the anterior leaflet of the mitral valve.  6. The aortic valve is tricuspid. Aortic valve regurgitation is moderate to severe. No aortic stenosis is present. Aortic regurgitation PHT measures 411 msec.  7. The inferior vena cava is normal in size with greater than 50% respiratory variability, suggesting right atrial pressure of 3 mmHg. FINDINGS  Left Ventricle: Left ventricular ejection fraction, by estimation, is 60 to 65%. The left ventricle has normal function. The left ventricle has no regional wall motion abnormalities. The left ventricular internal cavity size was normal in size. There is  no left ventricular hypertrophy. Right Ventricle: The right ventricular size is normal. No increase in right ventricular wall thickness. Right ventricular systolic function is normal. Left Atrium: Left atrial size was normal in size. No left atrial/left atrial appendage thrombus was detected. Right Atrium: Right atrial size was normal in size. Pericardium: There is no evidence of pericardial effusion. Mitral Valve: Eccentric mitral regurgitation with Coanda effect. There is no pulmonary vein systolic reversal. There is at least moderate mitral regurgitation, likely severe. 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 The mitral valve is  normal in structure. There is severe prolapse of the middle segment of the anterior leaflet of the mitral valve. No evidence of mitral valve regurgitation. No evidence of mitral valve stenosis. Tricuspid Valve: The tricuspid valve is normal in structure. Tricuspid valve regurgitation is not demonstrated. No evidence of tricuspid stenosis. Aortic Valve: The aortic valve is tricuspid. Aortic valve regurgitation is moderate to severe. Aortic regurgitation PHT measures 411 msec. No aortic  stenosis is present. Pulmonic Valve: The pulmonic valve was normal in structure. Pulmonic valve regurgitation is not visualized. No evidence of pulmonic stenosis. Aorta: The aortic root is normal in size and structure. Venous: The inferior vena cava is normal in size with greater than 50% respiratory variability, suggesting right atrial pressure of 3 mmHg. IAS/Shunts: No atrial level shunt detected by color flow Doppler. Additional Comments: There is a small pleural effusion in the left lateral region. AORTIC VALVE AI PHT:      411 msec Chilton Si MD Electronically signed by Chilton Si MD Signature Date/Time: 06/09/2023/4:27:07 PM    Final    DG Abd Portable 1V  Result Date: 06/09/2023 CLINICAL DATA:  Feeding tube placement. EXAM: PORTABLE ABDOMEN - 1 VIEW COMPARISON:  06/04/2023 FINDINGS: Tip of the weighted enteric tube is below the diaphragm in the region of the mid stomach. Generalized paucity of upper abdominal bowel gas. Right upper quadrant surgical clips. IMPRESSION: Tip of the weighted enteric tube below the diaphragm in the region of the mid stomach. Electronically Signed   By: Narda Rutherford M.D.   On: 06/09/2023 16:20   DG CHEST PORT 1 VIEW  Result Date: 06/09/2023 CLINICAL DATA:  Fever. EXAM: PORTABLE CHEST 1 VIEW COMPARISON:  June 06, 2023. FINDINGS: Stable cardiomediastinal silhouette. Tracheostomy and feeding tubes are unchanged. Stable bilateral lung opacities are noted concerning for edema or pneumonia. Small pleural effusions may be present. Bony thorax is unremarkable. IMPRESSION: Stable support apparatus.  Stable bilateral lung opacities. Electronically Signed   By: Lupita Raider M.D.   On: 06/09/2023 12:39   [place] EKG SITE RITE  Result Date: 06/09/2023 If Site Rite image not attached, placement could not be confirmed due to current cardiac rhythm.  DG Chest Port 1 View  Result Date: 06/06/2023 CLINICAL DATA:  Tracheostomy EXAM: PORTABLE CHEST 1 VIEW COMPARISON:   One-view chest x-ray 06/04/2023 FINDINGS: A lordotic view is submitted.  Lung apices are incompletely imaged. Tracheostomy tube terminates 3 cm above the carina. A small bore feeding tube courses off the inferior border the film. Heart is enlarged. Interstitial and airspace opacities are present in the lower lobes bilaterally. Bilateral pleural effusions are again noted, right greater than left. IMPRESSION: 1. Tracheostomy tube terminates 3 cm above the carina. 2. Small bore feeding tube courses off the inferior border the film. 3. Stable cardiomegaly. 4. Stable interstitial and airspace opacities representing edema or infection. Electronically Signed   By: Marin Roberts M.D.   On: 06/06/2023 12:39   ECHOCARDIOGRAM COMPLETE  Result Date: 06/04/2023    ECHOCARDIOGRAM REPORT   Patient Name:   DECKARD GOHN Date of Exam: 06/04/2023 Medical Rec #:  [id number]  Height:       73.0 in Accession #:    [id number] Weight:       225.3 lb Date of Birth:  [birth year]   BSA:          2.263 m Patient Age:    67 years   BP:           123/46 mmHg Patient Gender: M          HR:           69 bpm. 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                 Mitral Valve Disease and Aortic Valve Disease; Risk                 Factors:Non-Smoker.  Sonographer:    Dondra Prader RVT RCS Referring Phys: [id number] Clarene Critchley [facility]  Sonographer Comments: Suboptimal parasternal window. Image acquisition challenging due to patient body habitus and Image acquisition challenging due to respiratory motion. IMPRESSIONS  1. There is moderate prolaspe of the anterior mitral leaflet, unable to determine if flail, recommend TEE. The mitral valve is abnormal. Severe mitral valve regurgitation. No evidence of mitral stenosis.  2. Left ventricular ejection fraction, by estimation, is 60 to 65%. The left ventricle has  normal function. The left ventricle  has no regional wall motion abnormalities. There is moderate asymmetric left ventricular hypertrophy of the inferior segment. Left ventricular diastolic function could not be evaluated.  3. Right ventricular systolic function is normal. The right ventricular size is normal.  4. Left atrial size was severely dilated.  5. The aortic valve is normal in structure. Aortic valve regurgitation is mild to moderate. Aortic valve sclerosis is present, with no evidence of aortic valve stenosis. Aortic regurgitation PHT measures 362 msec.  6. The inferior vena cava is normal in size with greater than 50% respiratory variability, suggesting right atrial pressure of 3 mmHg. FINDINGS  Left Ventricle: Left ventricular ejection fraction, by estimation, is 60 to 65%. The left ventricle has normal function. The left ventricle has no regional wall motion abnormalities. The left ventricular internal cavity size was normal in size. There is  moderate asymmetric left ventricular hypertrophy of the inferior segment. Left ventricular diastolic function could not be evaluated due to mitral regurgitation (moderate or greater). Left ventricular diastolic function could not be evaluated. Right Ventricle: The right ventricular size is normal. No increase in right ventricular wall thickness. Right ventricular systolic function is normal. Left Atrium: Left atrial size was severely dilated. Right Atrium: Right atrial size was normal in size. Pericardium: Trivial pericardial effusion is present. The pericardial effusion is anterior to the right ventricle. Mitral Valve: There is moderate prolaspe of the anterior mitral leaflet, unable to determine if flail, recommend TEE. The mitral valve is abnormal. Severe mitral valve regurgitation. No evidence of mitral valve stenosis. MV peak gradient, 99.6 mmHg. The mean mitral valve gradient is 81.0 mmHg. Tricuspid Valve: The tricuspid valve is normal in structure. Tricuspid  valve regurgitation is not demonstrated. No evidence of tricuspid stenosis. Aortic Valve: The aortic valve is normal in structure. Aortic valve regurgitation is mild to moderate. Aortic regurgitation PHT measures 362 msec. Aortic valve sclerosis is present, with no evidence of aortic valve stenosis. Aortic valve mean gradient measures 8.2 mmHg. Aortic valve peak gradient measures 15.5 mmHg. Aortic valve area, by VTI measures 3.52 cm. Pulmonic Valve: The pulmonic valve was not well visualized. Aorta: The aortic root is normal in size and structure. Venous: The inferior vena cava is normal in size with greater than 50% respiratory variability, suggesting right atrial pressure of 3 mmHg. IAS/Shunts: No atrial level shunt detected by color flow Doppler.  LEFT VENTRICLE PLAX 2D LVIDd:         5.30 cm   Diastology LVIDs:         3.50 cm   LV e' medial:    9.46 cm/s LV PW:         1.40 cm   LV E/e' medial:  16.7 LV IVS:        1.00 cm   LV e' lateral:   14.70 cm/s LVOT diam:     2.10 cm   LV E/e' lateral: 10.7 LV SV:         130 LV SV Index:   57 LVOT Area:     3.46 cm  RIGHT VENTRICLE             IVC RV S prime:     13.80 cm/s  IVC diam: 2.00 cm TAPSE (M-mode): 2.9 cm LEFT ATRIUM              Index        RIGHT ATRIUM           Index LA diam:  4.10 cm  1.81 cm/m   RA Area:     12.90 cm LA Vol (A2C):   140.0 ml 61.85 ml/m  RA Volume:   30.60 ml  13.52 ml/m LA Vol (A4C):   113.8 ml 50.28 ml/m LA Biplane Vol: 142.0 ml 62.74 ml/m  AORTIC VALVE AV Area (Vmax):    3.34 cm AV Area (Vmean):   3.13 cm AV Area (VTI):     3.52 cm AV Vmax:           196.80 cm/s AV Vmean:          130.600 cm/s AV VTI:            0.368 m AV Peak Grad:      15.5 mmHg AV Mean Grad:      8.2 mmHg LVOT Vmax:         190.00 cm/s LVOT Vmean:        118.000 cm/s LVOT VTI:          0.374 m LVOT/AV VTI ratio: 1.02 AI PHT:            362 msec  AORTA Ao Root diam: 3.50 cm Ao Asc diam:  3.50 cm MITRAL VALVE MV Area (PHT): 2.61 cm     SHUNTS MV  Area VTI:   0.92 cm     Systemic VTI:  0.37 m MV Peak grad:  99.6 mmHg    Systemic Diam: 2.10 cm MV Mean grad:  81.0 mmHg MV Vmax:       4.99 m/s MV Vmean:      438.0 cm/s MV Decel Time: 291 msec MV E velocity: 158.00 cm/s MV A velocity: 124.00 cm/s MV E/A ratio:  1.27 Kardie Tobb DO Electronically signed by Thomasene Ripple DO Signature Date/Time: 06/04/2023/11:15:56 AM    Final    DG Abd 1 View  Result Date: 06/04/2023 CLINICAL DATA:  Feeding tube placement. EXAM: ABDOMEN - 1 VIEW COMPARISON:  June 03, 2023. FINDINGS: Distal tip of feeding tube is seen in expected position of distal stomach or proximal duodenum. IMPRESSION: Distal tip of feeding tube is seen in expected position of distal stomach or proximal duodenum. Electronically Signed   By: Lupita Raider M.D.   On: 06/04/2023 08:14   DG CHEST PORT 1 VIEW  Result Date: 06/04/2023 CLINICAL DATA:  Endotracheal tube. EXAM: PORTABLE CHEST 1 VIEW COMPARISON:  June 03, 2023. FINDINGS: Stable cardiomegaly. Endotracheal and feeding tubes are unchanged. Stable bilateral lung opacities are noted concerning for edema or pneumonia with associated pleural effusions. Bony thorax is unremarkable. IMPRESSION: Stable support apparatus.  Stable bilateral lung opacities. Electronically Signed   By: Lupita Raider M.D.   On: 06/04/2023 08:12   DG Abd 1 View  Result Date: 06/03/2023 CLINICAL DATA:  78295 with abdominal distention. EXAM: ABDOMEN - 1 VIEW COMPARISON:  Chest CT partially including the abdomen 05/31/2023 FINDINGS: 8:49 p.m.  Supine portable AP abdomen study in 2 films. A feeding tube is in place with a radiopaque weighted tip. The tip of the tube is either in the most distal gastric antrum or in the bulb of the duodenum. There are cholecystectomy clips. No dilated bowel is seen. There is a general paucity of bowel aeration. The bladder is catheterized. There is no supine evidence for free air. No visible pathologic calcification. There are degenerative  changes in the lumbar spine. Patchy opacities both lung bases are noted with moderate layering pleural effusions. Cardiomegaly. IMPRESSION: 1. The tip of the  feeding tube is either in the most distal gastric antrum or in the bulb of the duodenum. 2. No dilated bowel segments, with a generalized paucity of bowel aeration noted. 3. Cardiomegaly with bilateral pleural effusions and bibasilar airspace opacities. Electronically Signed   By: Almira Bar M.D.   On: 06/03/2023 21:26   Portable Chest x-ray  Result Date: 06/03/2023 CLINICAL DATA:  Hypertension respiratory failure EXAM: PORTABLE CHEST 1 VIEW COMPARISON:  02/04/2016, chest x-ray 06/03/2023, 06/02/2023 FINDINGS: Endotracheal tube tip is about 3.8 cm superior to the carina. Esophageal tube tip extends below diaphragm but is incompletely visualized. Cardiomegaly. Widespread bilateral airspace disease and dense bibasilar consolidations. IMPRESSION: 1. Endotracheal tube tip about 3.8 cm superior to the carina. 2. Cardiomegaly with widespread bilateral airspace disease and dense bibasilar consolidations either due to edema or diffuse pneumonia. Electronically Signed   By: Jasmine Pang M.D.   On: 06/03/2023 19:54    Labs:  CBC: Recent Labs    06/18/23 0338 06/18/23 1025 06/19/23 0308 06/20/23 0312 06/21/23 0300  WBC 7.9  --  8.4 6.5 7.7  HGB 10.3* 12.2* 9.9* 8.9* 9.7*  HCT 33.1* 36.0* 30.2* 27.6* 32.1*  PLT 148*  --  150 115* 136*    COAGS: Recent Labs    06/19/23 0308  INR 1.1  APTT 31    BMP: Recent Labs    06/19/23 0308 06/20/23 0312 06/21/23 0300 06/22/23 0411  NA 139 144 147* 150*  K 5.1 3.8 3.7 3.6  CL 104 107 107 106  CO2 28 28 30  34*  GLUCOSE 134* 148* 166* 145*  BUN 55* 66* 60* 60*  CALCIUM 8.4* 8.0* 8.9 9.8  CREATININE 1.20 0.95 0.82 0.87  GFRNONAA >60 >60 >60 >60    LIVER FUNCTION TESTS: Recent Labs    06/09/23 0829 06/11/23 0335 06/13/23 0328 06/14/23 0553  BILITOT 1.5* 1.3* 1.0 1.1  AST 30 38 31  28  ALT 18 30 25 24   ALKPHOS 48 49 46 45  PROT 5.0* 5.2* 5.5* 5.9*  ALBUMIN 1.8* 1.9* 1.9* 2.0*    TUMOR MARKERS: No results for input(s): "AFPTM", "CEA", "CA199", "CHROMGRNA" in the last 8760 hours.  Assessment and Plan:  Scheduled for percutaneous gastric tube placement in IR 10/23 Risks and benefits image guided gastrostomy tube placement was discussed with the patient's wife at bedside including, but not limited to the need for a barium enema during the procedure, bleeding, infection, peritonitis and/or damage to adjacent structures.  All questions were answered, family is agreeable to proceed.  Consent signed and in chart.  Thank you for this interesting consult.  I greatly enjoyed meeting Berish Rivenburg and look forward to participating in their care.  A copy of this report was sent to the requesting provider on this date.  Electronically Signed: Robet Leu, PA-C 06/22/2023, 2:00 PM   I spent a total of 20 Minutes    in face to face in clinical consultation, greater than 50% of which was counseling/coordinating care for percutaneous gastric tube placement

## 2023-06-22 NOTE — Progress Notes (Signed)
eLink Physician-Brief Progress Note Patient Name: Joshua Eaton DOB: [birth year] MRN: [id number]   Date of Service  06/22/2023  HPI/Events of Note  Hypotension, patient is - 5.8 liters following a diuretic earlier today.  eICU Interventions  LR 500 ml IV fluid bolus ordered.        Thomasene Lot Batool Majid 06/22/2023, 11:05 PM

## 2023-06-22 NOTE — Plan of Care (Signed)
  Problem: Clinical Measurements: Goal: Will remain free from infection Outcome: Progressing Goal: Diagnostic test results will improve Outcome: Progressing   Problem: Nutrition: Goal: Adequate nutrition will be maintained Outcome: Progressing   Problem: Education: Goal: Knowledge of General Education information will improve Description: Including pain rating scale, medication(s)/side effects and non-pharmacologic comfort measures Outcome: Not Progressing   Problem: Clinical Measurements: Goal: Respiratory complications will improve Outcome: Not Progressing Goal: Cardiovascular complication will be avoided Outcome: Not Progressing

## 2023-06-23 ENCOUNTER — Inpatient Hospital Stay (HOSPITAL_COMMUNITY): Payer: Medicare Other

## 2023-06-23 DIAGNOSIS — J9601 Acute respiratory failure with hypoxia: Secondary | ICD-10-CM | POA: Diagnosis not present

## 2023-06-23 LAB — BASIC METABOLIC PANEL
Anion gap: 9 (ref 5–15)
Anion gap: 8 (ref 5–15)
BUN: 65 mg/dL — ABNORMAL HIGH (ref 8–23)
BUN: 59 mg/dL — ABNORMAL HIGH (ref 8–23)
CO2: 30 mmol/L (ref 22–32)
CO2: 27 mmol/L (ref 22–32)
Calcium: 9.7 mg/dL (ref 8.9–10.3)
Calcium: 8.9 mg/dL (ref 8.9–10.3)
Chloride: 110 mmol/L (ref 98–111)
Chloride: 109 mmol/L (ref 98–111)
Creatinine, Ser: 0.88 mg/dL (ref 0.61–1.24)
Creatinine, Ser: 0.86 mg/dL (ref 0.61–1.24)
GFR, Estimated: >60 mL/min (ref >60)
GFR, Estimated: >60 mL/min (ref >60)
Glucose, Bld: 126 mg/dL — ABNORMAL HIGH (ref 70–99)
Glucose, Bld: 214 mg/dL — ABNORMAL HIGH (ref 70–99)
Potassium: 3.8 mmol/L (ref 3.5–5.1)
Potassium: 5.1 mmol/L (ref 3.5–5.1)
Sodium: 149 mmol/L — ABNORMAL HIGH (ref 135–145)
Sodium: 144 mmol/L (ref 135–145)

## 2023-06-23 LAB — CBC WITH DIFFERENTIAL/PLATELET
Abs Immature Granulocytes: 0.05 10*3/uL (ref 0.00–0.07)
Basophils Absolute: 0 10*3/uL (ref 0.0–0.1)
Basophils Relative: 1 %
Eosinophils Absolute: 0.1 10*3/uL (ref 0.0–0.5)
Eosinophils Relative: 2 %
HCT: 30.4 % — ABNORMAL LOW (ref 39.0–52.0)
Hemoglobin: 8.9 g/dL — ABNORMAL LOW (ref 13.0–17.0)
Immature Granulocytes: 1 %
Lymphocytes Relative: 26 %
Lymphs Abs: 1.5 10*3/uL (ref 0.7–4.0)
MCH: 31.3 pg (ref 26.0–34.0)
MCHC: 29.3 g/dL — ABNORMAL LOW (ref 30.0–36.0)
MCV: 107 fL — ABNORMAL HIGH (ref 80.0–100.0)
Monocytes Absolute: 0.6 10*3/uL (ref 0.1–1.0)
Monocytes Relative: 10 %
Neutro Abs: 3.4 10*3/uL (ref 1.7–7.7)
Neutrophils Relative %: 60 %
Platelets: 135 10*3/uL — ABNORMAL LOW (ref 150–400)
RBC: 2.84 MIL/uL — ABNORMAL LOW (ref 4.22–5.81)
RDW: 18.8 % — ABNORMAL HIGH (ref 11.5–15.5)
WBC: 5.7 10*3/uL (ref 4.0–10.5)
nRBC: 0 % (ref 0.0–0.2)

## 2023-06-23 LAB — GLUCOSE, CAPILLARY
Glucose-Capillary: 105 mg/dL — ABNORMAL HIGH (ref 70–99)
Glucose-Capillary: 106 mg/dL — ABNORMAL HIGH (ref 70–99)
Glucose-Capillary: 109 mg/dL — ABNORMAL HIGH (ref 70–99)
Glucose-Capillary: 124 mg/dL — ABNORMAL HIGH (ref 70–99)
Glucose-Capillary: 147 mg/dL — ABNORMAL HIGH (ref 70–99)
Glucose-Capillary: 90 mg/dL (ref 70–99)

## 2023-06-23 LAB — TSH: TSH: 0.809 u[IU]/mL (ref 0.350–4.500)

## 2023-06-23 LAB — MAGNESIUM: Magnesium: 2.5 mg/dL — ABNORMAL HIGH (ref 1.7–2.4)

## 2023-06-23 LAB — BRAIN NATRIURETIC PEPTIDE: B Natriuretic Peptide: 142.6 pg/mL — ABNORMAL HIGH (ref 0.0–100.0)

## 2023-06-23 MED ORDER — AMLODIPINE BESYLATE 5 MG PO TABS: 5.0000 mg | ORAL_TABLET | Freq: Every day | ORAL | Status: DC

## 2023-06-23 MED ORDER — CLONAZEPAM 1 MG PO TABS
1.0000 mg | ORAL_TABLET | Freq: Every day | ORAL | Status: DC
  Administered 2023-06-23 – 2023-06-25 (×3): 1 mg
  Filled 2023-06-23 (×3): qty 1

## 2023-06-23 MED ORDER — SODIUM CHLORIDE 0.9 % IV SOLN: 250.0000 mL | INTRAVENOUS | Status: AC

## 2023-06-23 MED ORDER — POTASSIUM CHLORIDE 10 MEQ/100ML IV SOLN
10.0000 meq | INTRAVENOUS | Status: AC
Start: 2023-06-23 — End: 2023-06-23
  Administered 2023-06-23 (×2): 10 meq via INTRAVENOUS
  Filled 2023-06-23 (×2): qty 100

## 2023-06-23 MED ORDER — PHENYLEPHRINE HCL-NACL 20-0.9 MG/250ML-% IV SOLN
25.0000 ug/min | INTRAVENOUS | Status: DC
  Administered 2023-06-23: 25 ug/min via INTRAVENOUS
  Filled 2023-06-23: qty 250

## 2023-06-23 MED ORDER — CLONAZEPAM 1 MG PO TBDP: 1.0000 mg | ORAL_TABLET | Freq: Every day | ORAL | Status: DC

## 2023-06-23 MED ORDER — CLONAZEPAM 0.5 MG PO TBDP: 0.5000 mg | ORAL_TABLET | Freq: Every day | ORAL | Status: DC

## 2023-06-23 MED ORDER — CLONAZEPAM 0.5 MG PO TABS
0.5000 mg | ORAL_TABLET | Freq: Every day | ORAL | Status: DC
  Administered 2023-06-23 – 2023-06-26 (×4): 0.5 mg
  Filled 2023-06-23 (×4): qty 1

## 2023-06-23 MED ORDER — ALBUMIN HUMAN 25 % IV SOLN
50.0000 g | Freq: Once | INTRAVENOUS | Status: AC
  Administered 2023-06-23: 50 g via INTRAVENOUS
  Filled 2023-06-23: qty 200

## 2023-06-23 NOTE — Progress Notes (Addendum)
Interventional Radiology Brief Note:  Unable to accommodate patient in IR today.  WIll update orders for possible placement of gastrostomy tube 06/24/23.   Spoke with RN.   NPO p MN Hold lovenox tonight and tomorrow AM.  Could consider heparin drip if needed, but will need to stop gtt prior to procedure. Please notify IR if initiated.  Per Dr. Loreta Ave, no need for additional PO contrast.   Loyce Dys, MS RD PA-C

## 2023-06-23 NOTE — Progress Notes (Signed)
NAME:  Joshua Eaton, MRN:  [id number], DOB:  1955/12/16, LOS: 20 ADMISSION DATE:  06/03/2023, CHIEF COMPLAINT:  Respiratory failure   History of Present Illness:   This is a 67 year old male with a past medical history of colon cancer status post right hemicolectomy 2007, COPD, mitral valve Regurgitation who presented to [facility] health on 05/20/2023 for shortness of breath. Prior to presenting, patient was feeling unwell for about 6 days with cough, shortness of breath. He had an initial CXR showing bilateral airspace opacities and was eventually placed on BiPAP and admitted to ICU. On 9/20 he was intubated due to ongoing respiratory failure and antibiotics were widened for multifocal pneumonia. He had echo performed showing EF 60-65%, severe dilated LA, severe MR. Throughout his course at [facility] had ongoing ARDS picture, developed worsening AKI. Eventually, [facility] PCCM team was consulted for transfer here due to ongoing high vent requirements, unable to wean. Additionally, there was concern over need for MV replacement and possible need for right heart cath.    Pertinent  Medical History  Colon cancer, status post hemicolectomy (right) 2007, questionable COPD wears CPAP at home, mitral valve regurgitation   Significant Hospital Events: Including procedures, antibiotic start and stop dates in addition to other pertinent events   Admitted to [facility] health on 05/20/2023 06/03/2023: Transferred from [place] [facility] to [facility] MRSA PCR negative 06/04/2023 Tracheal aspirate no growth 10/5 failed SBT 10/06: Tracheostomy  06/08/2023 Blood culture no growth as of 06/13/2023 06/09/2023: TEE performed at bedside 10/10: Mitral valve perforation on echo. Cardiology discussed limited options Tracheal aspirate normal for 10/11: CTS consulted 10/12: GOC with family yesterday. Currently not surgical candidate. Agreed to continue current care, Good UOP with diuresis, Diarrhea unchanged, Tolerating PS C.  difficile screen negative 10/18: overnight with cuff leak, trach evaluated, re-intubated from above, trach revision  10/19: doing well on SBT, sedation switched to precedex, plan for trach collar 10/20: arouses off sedation, tolerated SBT, trial of trach collar 10/21: Transferred back to vent 2/2 significant secretions  Interim History / Subjective:  Slowly improving mentation.  Follows some commands this morning and opens his eyes to verbal stimuli.  Objective   Blood pressure (!) 119/52, pulse 75, temperature 97.7 F (36.5 C), temperature source Axillary, resp. rate 15, height 6' 0.99" (1.854 m), weight 86.3 kg, SpO2 100%.    Vent Mode: PRVC FiO2 (%):  [40 %] 40 % Set Rate:  [15 bmp] 15 bmp Vt Set:  [630 mL] 630 mL PEEP:  [5 cmH20] 5 cmH20 Pressure Support:  [5 cmH20] 5 cmH20 Plateau Pressure:  [9 cmH20-19 cmH20] 19 cmH20   Intake/Output Summary (Last 24 hours) at 06/23/2023 4782 Last data filed at 06/23/2023 0400 Gross per 24 hour  Intake 2233.36 ml  Output 2200 ml  Net 33.36 ml   Filed Weights   06/20/23 0500 06/21/23 0352 06/22/23 0500  Weight: 88.9 kg 92.1 kg 86.3 kg    Examination: Constitutional: Ill-appearing elderly male.   Eyes: Sclera non-icteric, PERRL Neck: Trach collar in place with dried blood and dark mucus surrounding tracheostomy site Cardio: Regular rate and rhythm.  Very prominent systolic murmur over the apex. Pulm: ventilated breath sounds bilaterally Abdomen: Soft, non-tender, non-distended, positive bowel sounds. MSK: 1+ pitting edema to lower extremities bilaterally.   Skin:Warm and dry. Neuro: Awake but no verbal response.  Moves arms and legs independently.  Follows some commands and intermittently tracks across the room.  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  Remains in sinus rhythm PLAN -Lasix 80 mg IV, metolazone 5 mg, and add acetazolamide 500 mg IV yesterday with good UOP and electrolytes stable or improved -Hold diuresis today -Appreciate Cardiology involvement. Family has requested to minimize cardiology consults unless needed -Appreciate TCTS involvement. Not a candidate. Consider reevaluation versus transfer to tertiary center if stabilizes -Hydralazine 50 mg 3 times daily and amlodipine 5 mg daily -Continue amiodarone drip and transition to oral after PEG tube placement  #Acute hypoxemic respiratory failure status post tracheostomy still requiring mechanical ventilation #Pulmonary edema secondary to HFpEF exacerbation #Severe mitral regurgitation #Aortic valve insufficiency #Multifocal pneumonia  PLAN -PRVC, attempt to wean to trach collar today after PEG tube -tracheostomy revision performed 10/18, now with 8.0 Shiley proximal XLT -VAP bundle, stress ulcer prophylaxis -s/p 7 days zosyn (end date 10/16)   Acute metabolic encephalopathy 2/2 critical illness No current etiology determined.  CT and MRI of head negative for possible cause.  No known liver disease and ammonia normal during admission.  Resumed home benzodiazepine without significant change.  Started high-dose thiamine.  Electrolytes within normal limits. plan -continue oxycodone given signs of pain on exam -Off sedation, still encephalopathic but awake and slowly improving -Continue lorazepam 0.5 mg in the a.m. and 1 mg in the p.m.  #Fevers - RUQ neg. Resp culture with OP flora.  Blood cultures neg. Foley discontinued. Concern aspiration. ID consult. C Diff negative.  Improved with initiation of Zosyn for presumed aspiration pneumonia. -Zosyn x  7 days through 06/16/23 lead  to defervescence  -UE DVT on Dopplers 10/16   # Upper extremity DVT: -- Lovenox 1 mg/KG twice daily, consider transition to DOAC once done with PEG procedure  #Macrocytic anemia #Thrombocytopenia -- Transfuse if hemoglobin 7 or less   #Severe protein calorie malnutrition  -Continue tube feeds.  PEG tube placement today   #BPH Great urine output.  No concerns. -Continue on external urinary catheter   #Hyperbilirubinemia -Resolved   #Hypernatremia: Improved to 144 after addition of acetazolamide to Lasix and metolazone as well as 500 mL LR bolus -continue to monitor, hold diuresis today   Best Practice (right click and "Reselect all SmartList Selections" daily)   Diet/type: tubefeeds DVT prophylaxis: systemic dose LMWH GI prophylaxis: PPI Lines: Central line and yes and it is still needed Foley:  N/A Code Status:  full code Last date of multidisciplinary goals of care discussion [06/21/2023]  Labs   CBC: Recent Labs  Lab 06/18/23 0338 06/18/23 1025 06/19/23 0308 06/20/23 0312 06/21/23 0300 06/23/23 0531  WBC 7.9  --  8.4 6.5 7.7 5.7  NEUTROABS  --   --   --   --   --  3.4  HGB 10.3* 12.2* 9.9* 8.9* 9.7* 8.9*  HCT 33.1* 36.0* 30.2* 27.6* 32.1* 30.4*  MCV 106.1*  --  102.7* 102.6* 105.2* 107.0*  PLT 148*  --  150 115* 136* 135*    Basic Metabolic Panel: Recent Labs  Lab 06/19/23 1935 06/20/23 0312 06/20/23 1711 06/21/23 0300 06/22/23 0411 06/22/23 1358 06/22/23 2250 06/23/23 0531  NA  --  144  --  147* 150* 150* 149* 144  K  --  3.8  --  3.7 3.6 3.6 3.8 5.1  CL  --  107  --  107 106 110 110 109  CO2  --  28  --  30 34* 32 30 27  GLUCOSE  --  148*  --  166* 145* 149* 126* 214*  BUN  --  66*  --  60* 60* 66* 65* 59*  CREATININE  --  0.95  --  0.82 0.87 0.85 0.88 0.86  CALCIUM  --  8.0*  --  8.9 9.8 9.2 9.7 8.9  MG 2.8* 2.9* 2.8*  --   --   --  2.6* 2.5*   GFR: Estimated Creatinine Clearance: 91.6 mL/min (by C-G formula based on SCr of 0.86  mg/dL). Recent Labs  Lab 06/19/23 0308 06/19/23 1820 06/19/23 1935 06/20/23 0312 06/21/23 0300 06/23/23 0531  WBC 8.4  --   --  6.5 7.7 5.7  LATICACIDVEN  --  0.9 1.0  --   --   --     Liver Function Tests: No results for input(s): "AST", "ALT", "ALKPHOS", "BILITOT", "PROT", "ALBUMIN" in the last 168 hours.  No results for input(s): "LIPASE", "AMYLASE" in the last 168 hours.  No results for input(s): "AMMONIA" in the last 168 hours.  ABG    Component Value Date/Time   PHART 7.451 (H) 06/18/2023 1025   PCO2ART 46.5 06/18/2023 1025   PO2ART 142 (H) 06/18/2023 1025   HCO3 32.3 (H) 06/18/2023 1025   TCO2 34 (H) 06/18/2023 1025   O2SAT 99 06/18/2023 1025     Coagulation Profile: Recent Labs  Lab 06/19/23 0308  INR 1.1    Cardiac Enzymes: No results for input(s): "CKTOTAL", "CKMB", "CKMBINDEX", "TROPONINI" in the last 168 hours.  HbA1C: Hgb A1c MFr Bld  Date/Time Value Ref Range Status  12/09/2015 06:30 PM 5.1 4.8 - 5.6 % Final    Comment:    (NOTE)         Pre-diabetes: 5.7 - 6.4         Diabetes: >6.4         Glycemic control for adults with diabetes: <7.0     CBG: Recent Labs  Lab 06/22/23 1147 06/22/23 1541 06/22/23 1924 06/22/23 2307 06/23/23 0312  GLUCAP 145* 96 144* 147* 90    Review of Systems:   Unable to obtain  Past Medical History:  He,  has a past medical history of Anxiety, Cancer (HCC), Depression, Hypertension, and OCD (obsessive compulsive disorder).   Surgical History:   Past Surgical History:  Procedure Laterality Date   CHOLECYSTECTOMY     COLON SURGERY     cancerous polyp removed   HERNIA REPAIR       Social History:   reports that he has never smoked. He has never used smokeless tobacco. He reports that he does not drink alcohol and does not use drugs.   Family History:  His family history is not on file.   Allergies Allergies  Allergen Reactions   Codeine     "sends me into orbit"     Home Medications   Prior to Admission medications   Medication Sig Start Date End Date Taking? Authorizing Provider  acetaminophen (TYLENOL) 325 MG tablet Take 650 mg by mouth every 6 (six) hours as needed for moderate pain.   Yes [provider]  aspirin EC 81 MG tablet Take 81 mg by mouth daily. Swallow whole.   Yes [provider]  benzonatate (TESSALON) 200 MG capsule Take 200 mg by mouth 3 (three) times  daily as needed for cough. 05/11/23  Yes [provider]  budesonide (PULMICORT) 0.5 MG/2ML nebulizer solution Take 0.5 mg by nebulization 2 (two) times daily.   Yes [provider]  chlorhexidine (PERIDEX) 0.12 % solution Use as directed 15 mLs in the mouth or throat 2 (two) times daily.   Yes [provider]  dexmedeTOMIDine HCl (PRECEDEX IV) Inject 1 Dose into the vein every 12 (twelve) hours. in 100 mL 10.71mL/hr Q12H   Yes [provider]  feeding supplement (OSMOLITE 1 CAL) LIQD Take 1,000 mLs by mouth daily. 37mL/hr   Yes [provider]  fentaNYL citrate (PF) 10 mcg/mL in sodium chloride 0.9 % 200 mL Inject 50 mcg/hr into the vein See admin instructions. 2,525mcg in Rate: 8mL/hr Freq: Q12H   Yes [provider]  guaiFENesin-Codeine 200-20 MG/10ML SOLN Take 10 mLs by mouth every 6 (six) hours as needed (Cough).   Yes [provider]  ipratropium-albuterol (DUONEB) 0.5-2.5 (3) MG/3ML SOLN Take 3 mLs by nebulization every 6 (six) hours.   Yes [provider]  levofloxacin (LEVAQUIN) 750 MG/150ML SOLN Inject 750 mg into the vein daily. Rate: 18ml/hr Dur: 90 minutes Freq: Q24H Started 05/31/23 End 06/03/23   Yes [provider]  LORazepam (ATIVAN) 0.5 MG tablet Take 0.5 mg by mouth in the morning.   Yes [provider]  LORazepam (ATIVAN) 1 MG tablet Take 1 mg by mouth at bedtime.   Yes [provider]  LORazepam (ATIVAN) 2 MG/ML injection Inject 0.5 mg into the vein every 6  (six) hours as needed for anxiety.   Yes [provider]  methylPREDNISolone acetate (DEPO-MEDROL) 40 MG/ML injection 20 mg every 12 (twelve) hours. Started 05/29/23 End 06/12/23   Yes [provider]  METOCLOPRAMIDE HCL IJ Inject 10 mg into the vein every 6 (six) hours.   Yes [provider]  Multiple Vitamins-Minerals (MULTIVITAL PO) Take 5 mLs by mouth in the morning and at bedtime.   Yes [provider]  OLANZapine (ZYPREXA) 2.5 MG tablet Take 1 tablet (2.5 mg total) by mouth at bedtime. 01/20/23  Yes Cottle, Steva Ready., MD  ondansetron [facility]) 4 MG/2ML SOLN injection Inject 4 mg into the vein every 6 (six) hours as needed for nausea or vomiting.   Yes [provider]  pantoprazole 40 mg in sodium chloride 0.9 % 100 mL Inject 40 mg into the vein every 12 (twelve) hours.   Yes [provider]  polyethylene glycol (MIRALAX / GLYCOLAX) 17 g packet Take 17 g by mouth 2 (two) times daily as needed for moderate constipation. Give with lunch and evening meal if no BM during the day as needed   Yes [provider]  promethazine (PHENERGAN) 25 MG/ML injection Inject 12.5 mg into the muscle every 6 (six) hours as needed for nausea or vomiting.   Yes [provider]  propofol (DIPRIVAN) 1000 MG/100ML EMUL injection Inject 2.939 mLs into the vein See admin instructions. Rate: 2.926mL/hr Dur: 34hr 2 min Freq: Every 6 hours   Yes [provider]  propranolol (INDERAL) 20 MG tablet TAKE 1 TO 2 TABLETS BY MOUTH  TWICE DAILY AS NEEDED 01/21/23  Yes Cottle, Steva Ready., MD  sennosides-docusate sodium (SENOKOT-S) 8.6-50 MG tablet Take 1 tablet by mouth 2 (two) times daily as needed for constipation.   Yes [provider]  zolpidem (AMBIEN) 5 MG tablet Take 5 mg by mouth at bedtime as needed for sleep.  Yes [provider]  doxycycline (VIBRAMYCIN) 100 MG capsule Take 100 mg by mouth 2 (two) times daily. Patient not  taking: Reported on 06/05/2023 05/14/23   [provider]  gabapentin (NEURONTIN) 100 MG capsule Take 300 mg by mouth at bedtime. Patient not taking: Reported on 06/05/2023 12/11/15   [provider]  loratadine (CLARITIN) 10 MG tablet Take 10 mg by mouth daily as needed for allergies. Patient not taking: Reported on 06/05/2023 05/11/23   [provider]  meloxicam (MOBIC) 7.5 MG tablet Take 7.5 mg by mouth 2 (two) times daily. Patient not taking: Reported on 06/05/2023 04/04/23   [provider]  methocarbamol (ROBAXIN) 500 MG tablet Take 500 mg by mouth 2 (two) times daily as needed for muscle spasms. Patient not taking: Reported on 06/05/2023 04/04/23   [provider]  predniSONE (DELTASONE) 20 MG tablet Take 10-40 mg by mouth See admin instructions. AKE 2 TABLETS (40mg ) BY MOUTH ONCE DAILY UNTIL SYMPTOMS IMPROVE, THEN DECREASE TO 1/2 TABLET (10mg ) EVERY OTHER DAY Patient not taking: Reported on 06/05/2023 05/14/23   [provider]  promethazine-dextromethorphan (PROMETHAZINE-DM) 6.25-15 MG/5ML syrup Take 5 mLs by mouth every 6 (six) hours as needed for cough. Patient not taking: Reported on 06/05/2023 05/11/23   [provider]  sertraline (ZOLOFT) 100 MG tablet TAKE 2 TABLETS BY MOUTH IN THE  MORNING AND 1 TABLET BY MOUTH IN THE EVENING 03/17/23   Cottle, Steva Ready., MD     Critical care time: 35 minutes    Rocky Morel, DO Internal Medicine Resident, PGY-2 Pager# [phone] [facility] 06/23/2023 6:25 AM

## 2023-06-23 NOTE — TOC Progression Note (Signed)
Transition of Care [facility] - Progression Note    Patient Details  Name: Joshua Eaton MRN: [id number] Date of Birth: 04/15/1956  Transition of Care [facility]) CM/SW Contact  Epifanio Lesches, RN Phone Number: 06/23/2023, 2:09 PM  Clinical Narrative:    Expedited appeal in process for LTAC level of care. TOC team following and will assist with needs...  Expected Discharge Plan: Long Term Acute Care (LTAC) Barriers to Discharge: Other (must enter comment), Continued Medical Work up (LTAC [facility])  Expected Discharge Plan and Services In-house Referral: Clinical Social Work   Post Acute Care Choice: Long Term Acute Care (LTAC) Living arrangements for the past 2 months: Single Family Home                                       Social Determinants of Health (SDOH) Interventions SDOH Screenings   Tobacco Use: Low Risk  (01/20/2023)    Readmission Risk Interventions     No data to display

## 2023-06-23 NOTE — Progress Notes (Signed)
eLink Physician-Brief Progress Note Patient Name: Joshua Eaton DOB: 02/01/1956 MRN: [id number]   Date of Service  Date of Service   06/23/2023  HPI/Events of Note   BP 92/40 after 500 ml LR bolus, MAP 54. HR 130 - 140.  eICU Interventions   Will start peripheral Phenylephrine gtt and check BNP, if consistent with volume depletion will consider additional fluid boluses.    HPI/Events of Note    eICU Interventions          Nekeya Briski U Brewer Hitchman 06/23/2023, 1:32 AM

## 2023-06-24 ENCOUNTER — Inpatient Hospital Stay (HOSPITAL_COMMUNITY): Payer: Medicare Other

## 2023-06-24 DIAGNOSIS — J9601 Acute respiratory failure with hypoxia: Secondary | ICD-10-CM | POA: Diagnosis not present

## 2023-06-24 HISTORY — PX: IR GASTROSTOMY TUBE MOD SED: IMG625

## 2023-06-24 LAB — CULTURE, RESPIRATORY W GRAM STAIN
Culture: NORMAL
Gram Stain: NONE SEEN

## 2023-06-24 LAB — BASIC METABOLIC PANEL
Anion gap: 8 (ref 5–15)
BUN: 47 mg/dL — ABNORMAL HIGH (ref 8–23)
CO2: 28 mmol/L (ref 22–32)
Calcium: 9.3 mg/dL (ref 8.9–10.3)
Chloride: 112 mmol/L — ABNORMAL HIGH (ref 98–111)
Creatinine, Ser: 0.93 mg/dL (ref 0.61–1.24)
GFR, Estimated: >60 mL/min (ref >60)
Glucose, Bld: 115 mg/dL — ABNORMAL HIGH (ref 70–99)
Potassium: 3.4 mmol/L — ABNORMAL LOW (ref 3.5–5.1)
Sodium: 148 mmol/L — ABNORMAL HIGH (ref 135–145)

## 2023-06-24 LAB — GLUCOSE, CAPILLARY
Glucose-Capillary: 110 mg/dL — ABNORMAL HIGH (ref 70–99)
Glucose-Capillary: 124 mg/dL — ABNORMAL HIGH (ref 70–99)
Glucose-Capillary: 128 mg/dL — ABNORMAL HIGH (ref 70–99)
Glucose-Capillary: 130 mg/dL — ABNORMAL HIGH (ref 70–99)
Glucose-Capillary: 146 mg/dL — ABNORMAL HIGH (ref 70–99)
Glucose-Capillary: 86 mg/dL (ref 70–99)

## 2023-06-24 MED ORDER — AMIODARONE HCL 200 MG PO TABS: 200.0000 mg | ORAL_TABLET | Freq: Every day | ORAL | Status: DC

## 2023-06-24 MED ORDER — FENTANYL CITRATE (PF) 100 MCG/2ML IJ SOLN
INTRAMUSCULAR | Status: AC | PRN
  Administered 2023-06-24 (×2): 50 ug via INTRAVENOUS

## 2023-06-24 MED ORDER — TRIPLE ANTIBIOTIC 3.5-400-5000 EX OINT
1.0000 | TOPICAL_OINTMENT | Freq: Every day | CUTANEOUS | Status: DC
  Administered 2023-06-24 – 2023-06-28 (×5): 1 via TOPICAL
  Filled 2023-06-24 (×5): qty 1

## 2023-06-24 MED ORDER — AMIODARONE HCL 200 MG PO TABS: 200.0000 mg | ORAL_TABLET | Freq: Two times a day (BID) | ORAL | Status: DC

## 2023-06-24 MED ORDER — MIDAZOLAM HCL 2 MG/2ML IJ SOLN
INTRAMUSCULAR | Status: AC
  Filled 2023-06-24: qty 2

## 2023-06-24 MED ORDER — FUROSEMIDE 10 MG/ML IJ SOLN
80.0000 mg | Freq: Once | INTRAMUSCULAR | Status: AC
  Administered 2023-06-24: 80 mg via INTRAVENOUS
  Filled 2023-06-24: qty 8

## 2023-06-24 MED ORDER — APIXABAN 5 MG PO TABS
5.0000 mg | ORAL_TABLET | Freq: Two times a day (BID) | ORAL | Status: DC
  Administered 2023-06-24 – 2023-06-28 (×8): 5 mg
  Filled 2023-06-24 (×8): qty 1

## 2023-06-24 MED ORDER — IOHEXOL 300 MG/ML  SOLN
50.0000 mL | Freq: Once | INTRAMUSCULAR | Status: AC | PRN
  Administered 2023-06-24: 15 mL

## 2023-06-24 MED ORDER — METOLAZONE 2.5 MG PO TABS
5.0000 mg | ORAL_TABLET | Freq: Once | ORAL | Status: AC
  Administered 2023-06-24: 5 mg
  Filled 2023-06-24: qty 2

## 2023-06-24 MED ORDER — POTASSIUM CHLORIDE 20 MEQ PO PACK
40.0000 meq | PACK | Freq: Once | ORAL | Status: AC
  Administered 2023-06-24: 40 meq
  Filled 2023-06-24: qty 2

## 2023-06-24 MED ORDER — POTASSIUM CHLORIDE 20 MEQ PO PACK
40.0000 meq | PACK | Freq: Once | ORAL | Status: AC
Start: 2023-06-24 — End: 2023-06-24
  Administered 2023-06-24: 40 meq
  Filled 2023-06-24: qty 2

## 2023-06-24 MED ORDER — AMIODARONE HCL 200 MG PO TABS
200.0000 mg | ORAL_TABLET | Freq: Every day | ORAL | Status: DC
Start: 2023-07-01 — End: 2023-06-24

## 2023-06-24 MED ORDER — CEFAZOLIN SODIUM-DEXTROSE 2-4 GM/100ML-% IV SOLN
INTRAVENOUS | Status: AC | PRN
  Administered 2023-06-24: 2 g via INTRAVENOUS

## 2023-06-24 MED ORDER — LIDOCAINE HCL 1 % IJ SOLN
20.0000 mL | Freq: Once | INTRAMUSCULAR | Status: AC
  Administered 2023-06-24: 10 mL

## 2023-06-24 MED ORDER — AMIODARONE HCL 200 MG PO TABS
200.0000 mg | ORAL_TABLET | Freq: Two times a day (BID) | ORAL | Status: DC
  Administered 2023-06-24 – 2023-06-28 (×9): 200 mg
  Filled 2023-06-24 (×9): qty 1

## 2023-06-24 MED ORDER — LIDOCAINE HCL 1 % IJ SOLN
INTRAMUSCULAR | Status: AC
Start: 2023-06-24 — End: ?
  Filled 2023-06-24: qty 20

## 2023-06-24 MED ORDER — AMIODARONE HCL IN DEXTROSE 360-4.14 MG/200ML-% IV SOLN
30.0000 mg/h | INTRAVENOUS | Status: AC
  Administered 2023-06-24: 30 mg/h via INTRAVENOUS
  Filled 2023-06-24: qty 200

## 2023-06-24 MED ORDER — FENTANYL CITRATE (PF) 100 MCG/2ML IJ SOLN
INTRAMUSCULAR | Status: AC
  Filled 2023-06-24: qty 2

## 2023-06-24 MED ORDER — MIDAZOLAM HCL 2 MG/2ML IJ SOLN
INTRAMUSCULAR | Status: AC | PRN
Start: 2023-06-24 — End: 2023-06-24
  Administered 2023-06-24 (×2): 1 mg via INTRAVENOUS

## 2023-06-24 MED ORDER — CEFAZOLIN SODIUM-DEXTROSE 2-4 GM/100ML-% IV SOLN
INTRAVENOUS | Status: AC
  Filled 2023-06-24: qty 100

## 2023-06-24 NOTE — Progress Notes (Signed)
[facility] ADULT ICU REPLACEMENT PROTOCOL   The patient does apply for the [facility] Adult ICU Electrolyte Replacment Protocol based on the criteria listed below:   1.Exclusion criteria: TCTS, ECMO, Dialysis, and Myasthenia Gravis patients 2. Is GFR >/= 30 ml/min? Yes.    Patient's GFR today is >60 3. Is SCr </= 2? Yes.   Patient's SCr is 0.93 mg/dL 4. Did SCr increase >/= 0.5 in 24 hours? No. 5.Pt's weight >40kg  Yes.   6. Abnormal electrolyte(s):   K 3.4  7. Electrolytes replaced per protocol 8.  Call MD STAT for K+ </= 2.5, Phos </= 1, or Mag </= 1 Physician:  Shawn Stall R Cuinn Westerhold 06/24/2023 6:11 AM

## 2023-06-24 NOTE — Progress Notes (Signed)
RT took pt off ventilator and placed pt on ATC 10L/40% with cuff deflated. Pt is tolerating it well, sats currently 97% and is in no distress at this time. Vent @ bedside, RT will monitor as needed.

## 2023-06-24 NOTE — Progress Notes (Signed)
NAME:  Joshua Eaton, MRN:  [id number], DOB:  11-11-1955, LOS: 21 ADMISSION DATE:  06/03/2023, CHIEF COMPLAINT:  Respiratory failure   History of Present Illness:   This is a 67 year old male with a past medical history of colon cancer status post right hemicolectomy 2007, COPD, mitral valve Regurgitation who presented to [facility] health on 05/20/2023 for shortness of breath. Prior to presenting, patient was feeling unwell for about 6 days with cough, shortness of breath. He had an initial CXR showing bilateral airspace opacities and was eventually placed on BiPAP and admitted to ICU. On 9/20 he was intubated due to ongoing respiratory failure and antibiotics were widened for multifocal pneumonia. He had echo performed showing EF 60-65%, severe dilated LA, severe MR. Throughout his course at [facility] had ongoing ARDS picture, developed worsening AKI. Eventually, [facility] PCCM team was consulted for transfer here due to ongoing high vent requirements, unable to wean. Additionally, there was concern over need for MV replacement and possible need for right heart cath.    Pertinent  Medical History  Colon cancer, status post hemicolectomy (right) 2007, questionable COPD wears CPAP at home, mitral valve regurgitation   Significant Hospital Events: Including procedures, antibiotic start and stop dates in addition to other pertinent events   Admitted to [facility] health on 05/20/2023 06/03/2023: Transferred from [place] health to [facility] MRSA PCR negative 06/04/2023 Tracheal aspirate no growth 10/5 failed SBT 10/06: Tracheostomy  06/08/2023 Blood culture no growth as of 06/13/2023 06/09/2023: TEE performed at bedside 10/10: Mitral valve perforation on echo. Cardiology discussed limited options Tracheal aspirate normal for 10/11: CTS consulted 10/12: GOC with family yesterday. Currently not surgical candidate. Agreed to continue current care, Good UOP with diuresis, Diarrhea unchanged, Tolerating PS C.  difficile screen negative 10/18: overnight with cuff leak, trach evaluated, re-intubated from above, trach revision  10/19: doing well on SBT, sedation switched to precedex, plan for trach collar 10/20: arouses off sedation, tolerated SBT, trial of trach collar 10/21: Transferred back to vent 2/2 significant secretions  Interim History / Subjective:  Continued improvement in mentation, following more commands. Able to move feet on demand and moving bilateral UE spontaneously.   Objective   Blood pressure (!) 113/53, pulse 68, temperature 98.7 F (37.1 C), temperature source Axillary, resp. rate 15, height 6' 0.99" (1.854 m), weight 84.9 kg, SpO2 100%.    Vent Mode: PRVC FiO2 (%):  [40 %] 40 % Set Rate:  [15 bmp] 15 bmp Vt Set:  [630 mL] 630 mL PEEP:  [5 cmH20] 5 cmH20 Pressure Support:  [5 cmH20-10 cmH20] 5 cmH20 Plateau Pressure:  [18 cmH20-19 cmH20] 18 cmH20   Intake/Output Summary (Last 24 hours) at 06/24/2023 0618 Last data filed at 06/23/2023 2300 Gross per 24 hour  Intake 838.53 ml  Output 600 ml  Net 238.53 ml   Filed Weights   06/22/23 0500 06/23/23 0500 06/24/23 0500  Weight: 86.3 kg 77.9 kg 84.9 kg    Examination: Constitutional: Ill-appearing elderly male.   Eyes: Sclera non-icteric, PERRL Neck: Trach collar in place with dried blood and dark mucus surrounding tracheostomy site Cardio: Regular rate and rhythm.  Very prominent systolic murmur over the apex. Pulm: Coarse breath sounds in the anterior fields  Abdomen: Soft, non-tender, non-distended, positive bowel sounds. PEG tube in place MSK: mild left foot edema Skin:Warm and dry. Neuro: Awake and alert but no verbal response.  Follows commands including facial expressions, moving feet.  Continues to move arms independently.  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  Remains in sinus rhythm PLAN -held diuresis yesterday, net pos 500 mL, breathing well on trach collar, repeat cxr and resume diuresis with lasix and metolazone -Appreciate Cardiology involvement. Family has requested to minimize cardiology consults unless needed -Appreciate TCTS involvement. Not a candidate. Consider reevaluation versus transfer to tertiary center if stabilizes -Hydralazine 50 mg 3 times daily and amlodipine 5 mg daily -Continue amiodarone drip and transition to oral after PEG tube placement  #Acute hypoxemic respiratory failure status post tracheostomy still requiring mechanical ventilation #Pulmonary edema secondary to HFpEF exacerbation #Severe mitral regurgitation #Aortic valve insufficiency #Multifocal pneumonia  PLAN -PRVC and wean to trach collar  -tracheostomy revision performed 10/18, now with 8.0 Shiley proximal XLT -VAP bundle, stress ulcer prophylaxis -s/p 7 days zosyn (end date 10/16)   Acute metabolic encephalopathy 2/2 critical illness No current etiology determined.  CT and MRI of head negative for possible cause.  No known liver disease and ammonia normal during admission.  Resumed home benzodiazepine without significant change.  Started high-dose thiamine.  Electrolytes within normal limits. plan -Off sedation, still encephalopathic but awake and continuing to improve -Continue lorazepam 0.5 mg in the a.m. and 1 mg in the p.m.  #Fevers - RUQ neg. Resp culture with OP flora.  Blood cultures neg. Foley discontinued. Concern aspiration. ID consult. C Diff negative.  Improved with initiation of Zosyn for presumed aspiration pneumonia. -Zosyn x  7 days through 06/16/23 lead to defervescence  -UE DVT on Dopplers 10/16   # Upper  extremity DVT: -- Lovenox 1 mg/KG twice daily. transition to DOAC today s/p PEG  #Macrocytic anemia #Thrombocytopenia -- Transfuse if hemoglobin 7 or less   #Severe protein calorie malnutrition  -Continue tube feeds.  PEG tube placement today, will remove cortrak when PEG able to use   #BPH Great urine output.  No concerns. -Continue on external urinary catheter   #Hyperbilirubinemia -Resolved   #Hypernatremia: Improved to 144 after addition of acetazolamide to Lasix and metolazone as well as 500 mL LR bolus, up to 148 after hold diuretics yesterday -continue to monitor, resume diuresis    Best Practice (right click and "Reselect all SmartList Selections" daily)   Diet/type: tubefeeds DVT prophylaxis: systemic dose LMWH GI prophylaxis: PPI Lines: Central line and yes and it is still needed Foley:  N/A Code Status:  full code Last date of multidisciplinary goals of care discussion [06/21/2023]  Labs   CBC: Recent Labs  Lab 06/18/23 0338 06/18/23 1025 06/19/23 0308 06/20/23 0312 06/21/23 0300 06/23/23 0531  WBC 7.9  --  8.4 6.5 7.7 5.7  NEUTROABS  --   --   --   --   --  3.4  HGB 10.3* 12.2* 9.9* 8.9* 9.7* 8.9*  HCT 33.1* 36.0* 30.2* 27.6* 32.1* 30.4*  MCV 106.1*  --  102.7* 102.6* 105.2* 107.0*  PLT 148*  --  150 115* 136* 135*    Basic Metabolic Panel: Recent Labs  Lab 06/19/23 1935 06/20/23 0312 06/20/23 1711 06/21/23 0300 06/22/23 0411 06/22/23 1358 06/22/23 2250 06/23/23 0531 06/24/23 0504  NA  --  144  --    < > 150* 150* 149* 144  148*  K  --  3.8  --    < > 3.6 3.6 3.8 5.1 3.4*  CL  --  107  --    < > 106 110 110 109 112*  CO2  --  28  --    < > 34* 32 30 27 28   GLUCOSE  --  148*  --    < > 145* 149* 126* 214* 115*  BUN  --  66*  --    < > 60* 66* 65* 59* 47*  CREATININE  --  0.95  --    < > 0.87 0.85 0.88 0.86 0.93  CALCIUM  --  8.0*  --    < > 9.8 9.2 9.7 8.9 9.3  MG 2.8* 2.9* 2.8*  --   --   --  2.6* 2.5*  --    < > = values in this  interval not displayed.   GFR: Estimated Creatinine Clearance: 84.7 mL/min (by C-G formula based on SCr of 0.93 mg/dL). Recent Labs  Lab 06/19/23 0308 06/19/23 1820 06/19/23 1935 06/20/23 0312 06/21/23 0300 06/23/23 0531  WBC 8.4  --   --  6.5 7.7 5.7  LATICACIDVEN  --  0.9 1.0  --   --   --     Liver Function Tests: No results for input(s): "AST", "ALT", "ALKPHOS", "BILITOT", "PROT", "ALBUMIN" in the last 168 hours.  No results for input(s): "LIPASE", "AMYLASE" in the last 168 hours.  No results for input(s): "AMMONIA" in the last 168 hours.  ABG    Component Value Date/Time   PHART 7.451 (H) 06/18/2023 1025   PCO2ART 46.5 06/18/2023 1025   PO2ART 142 (H) 06/18/2023 1025   HCO3 32.3 (H) 06/18/2023 1025   TCO2 34 (H) 06/18/2023 1025   O2SAT 99 06/18/2023 1025     Coagulation Profile: Recent Labs  Lab 06/19/23 0308  INR 1.1    Cardiac Enzymes: No results for input(s): "CKTOTAL", "CKMB", "CKMBINDEX", "TROPONINI" in the last 168 hours.  HbA1C: Hgb A1c MFr Bld  Date/Time Value Ref Range Status  12/09/2015 06:30 PM 5.1 4.8 - 5.6 % Final    Comment:    (NOTE)         Pre-diabetes: 5.7 - 6.4         Diabetes: >6.4         Glycemic control for adults with diabetes: <7.0     CBG: Recent Labs  Lab 06/23/23 1201 06/23/23 1519 06/23/23 1952 06/23/23 2350 06/24/23 0356  GLUCAP 106* 109* 147* 124* 86    Review of Systems:   Unable to obtain  Past Medical History:  He,  has a past medical history of Anxiety, Cancer (HCC), Depression, Hypertension, and OCD (obsessive compulsive disorder).   Surgical History:   Past Surgical History:  Procedure Laterality Date   CHOLECYSTECTOMY     COLON SURGERY     cancerous polyp removed   HERNIA REPAIR       Social History:   reports that he has never smoked. He has never used smokeless tobacco. He reports that he does not drink alcohol and does not use drugs.   Family History:  His family history is not on  file.   Allergies Allergies  Allergen Reactions   Codeine     "sends me into orbit"     Home Medications  Prior to Admission medications   Medication Sig Start Date End Date Taking? Authorizing Provider  acetaminophen (TYLENOL) 325 MG tablet Take 650  mg by mouth every 6 (six) hours as needed for moderate pain.   Yes [provider]  aspirin EC 81 MG tablet Take 81 mg by mouth daily. Swallow whole.   Yes [provider]  benzonatate (TESSALON) 200 MG capsule Take 200 mg by mouth 3 (three) times daily as needed for cough. 05/11/23  Yes [provider]  budesonide (PULMICORT) 0.5 MG/2ML nebulizer solution Take 0.5 mg by nebulization 2 (two) times daily.   Yes [provider]  chlorhexidine (PERIDEX) 0.12 % solution Use as directed 15 mLs in the mouth or throat 2 (two) times daily.   Yes [provider]  dexmedeTOMIDine HCl (PRECEDEX IV) Inject 1 Dose into the vein every 12 (twelve) hours. in 100 mL 10.40mL/hr Q12H   Yes [provider]  feeding supplement (OSMOLITE 1 CAL) LIQD Take 1,000 mLs by mouth daily. 50mL/hr   Yes [provider]  fentaNYL citrate (PF) 10 mcg/mL in sodium chloride 0.9 % 200 mL Inject 50 mcg/hr into the vein See admin instructions. 2,540mcg in Rate: 77mL/hr Freq: Q12H   Yes [provider]  guaiFENesin-Codeine 200-20 MG/10ML SOLN Take 10 mLs by mouth every 6 (six) hours as needed (Cough).   Yes [provider]  ipratropium-albuterol (DUONEB) 0.5-2.5 (3) MG/3ML SOLN Take 3 mLs by nebulization every 6 (six) hours.   Yes [provider]  levofloxacin (LEVAQUIN) 750 MG/150ML SOLN Inject 750 mg into the vein daily. Rate: 162ml/hr Dur: 90 minutes Freq: Q24H Started 05/31/23 End 06/03/23   Yes [provider]  LORazepam (ATIVAN) 0.5 MG tablet Take 0.5 mg by mouth in the morning.   Yes [provider]  LORazepam (ATIVAN) 1 MG tablet Take 1 mg by mouth at  bedtime.   Yes [provider]  LORazepam (ATIVAN) 2 MG/ML injection Inject 0.5 mg into the vein every 6 (six) hours as needed for anxiety.   Yes [provider]  methylPREDNISolone acetate (DEPO-MEDROL) 40 MG/ML injection 20 mg every 12 (twelve) hours. Started 05/29/23 End 06/12/23   Yes [provider]  METOCLOPRAMIDE HCL IJ Inject 10 mg into the vein every 6 (six) hours.   Yes [provider]  Multiple Vitamins-Minerals (MULTIVITAL PO) Take 5 mLs by mouth in the morning and at bedtime.   Yes [provider]  OLANZapine (ZYPREXA) 2.5 MG tablet Take 1 tablet (2.5 mg total) by mouth at bedtime. 01/20/23  Yes Cottle, Steva Ready., MD  ondansetron [facility]) 4 MG/2ML SOLN injection Inject 4 mg into the vein every 6 (six) hours as needed for nausea or vomiting.   Yes [provider]  pantoprazole 40 mg in sodium chloride 0.9 % 100 mL Inject 40 mg into the vein every 12 (twelve) hours.   Yes [provider]  polyethylene glycol (MIRALAX / GLYCOLAX) 17 g packet Take 17 g by mouth 2 (two) times daily as needed for moderate constipation. Give with lunch and evening meal if no BM during the day as needed   Yes [provider]  promethazine (PHENERGAN) 25 MG/ML injection Inject 12.5 mg into the muscle every 6 (six) hours as needed for nausea or vomiting.   Yes [provider]  propofol (DIPRIVAN) 1000 MG/100ML EMUL injection Inject 2.939 mLs into the vein See admin instructions. Rate: 2.925mL/hr Dur: 34hr 2 min Freq: Every 6 hours   Yes [provider]  propranolol (INDERAL) 20 MG tablet TAKE 1 TO 2 TABLETS BY MOUTH  TWICE DAILY AS NEEDED  01/21/23  Yes Cottle, Steva Ready., MD  sennosides-docusate sodium (SENOKOT-S) 8.6-50 MG tablet Take 1 tablet by mouth 2 (two) times daily as needed for constipation.   Yes [provider]  zolpidem (AMBIEN) 5 MG tablet Take 5 mg by mouth at bedtime as needed for sleep.   Yes  [provider]  doxycycline (VIBRAMYCIN) 100 MG capsule Take 100 mg by mouth 2 (two) times daily. Patient not taking: Reported on 06/05/2023 05/14/23   [provider]  gabapentin (NEURONTIN) 100 MG capsule Take 300 mg by mouth at bedtime. Patient not taking: Reported on 06/05/2023 12/11/15   [provider]  loratadine (CLARITIN) 10 MG tablet Take 10 mg by mouth daily as needed for allergies. Patient not taking: Reported on 06/05/2023 05/11/23   [provider]  meloxicam (MOBIC) 7.5 MG tablet Take 7.5 mg by mouth 2 (two) times daily. Patient not taking: Reported on 06/05/2023 04/04/23   [provider]  methocarbamol (ROBAXIN) 500 MG tablet Take 500 mg by mouth 2 (two) times daily as needed for muscle spasms. Patient not taking: Reported on 06/05/2023 04/04/23   [provider]  predniSONE (DELTASONE) 20 MG tablet Take 10-40 mg by mouth See admin instructions. AKE 2 TABLETS (40mg ) BY MOUTH ONCE DAILY UNTIL SYMPTOMS IMPROVE, THEN DECREASE TO 1/2 TABLET (10mg ) EVERY OTHER DAY Patient not taking: Reported on 06/05/2023 05/14/23   [provider]  promethazine-dextromethorphan (PROMETHAZINE-DM) 6.25-15 MG/5ML syrup Take 5 mLs by mouth every 6 (six) hours as needed for cough. Patient not taking: Reported on 06/05/2023 05/11/23   [provider]  sertraline (ZOLOFT) 100 MG tablet TAKE 2 TABLETS BY MOUTH IN THE  MORNING AND 1 TABLET BY MOUTH IN THE EVENING 03/17/23   Cottle, Steva Ready., MD     Critical care time: 40 minutes    Rocky Morel, DO Internal Medicine Resident, PGY-2 Pager# [phone] [facility] 06/24/2023 6:18 AM

## 2023-06-24 NOTE — Procedures (Addendum)
Interventional Radiology Procedure Note  Procedure: Placement of percutaneous 57F pull-through gastrostomy tube. Complications: None Recommendations: - OK to use in 4 hours.  - Routine wound care - ice prn  Signed,  Yvone Neu. Loreta Ave, DO, ABVM, RPVI

## 2023-06-24 NOTE — Plan of Care (Signed)
  Problem: Clinical Measurements: Goal: Ability to maintain clinical measurements within normal limits will improve Outcome: Progressing Goal: Will remain free from infection Outcome: Progressing Goal: Respiratory complications will improve Outcome: Progressing Goal: Cardiovascular complication will be avoided Outcome: Progressing   Problem: Activity: Goal: Risk for activity intolerance will decrease Outcome: Progressing   Problem: Nutrition: Goal: Adequate nutrition will be maintained Outcome: Progressing   Problem: Safety: Goal: Ability to remain free from injury will improve Outcome: Progressing

## 2023-06-24 NOTE — Progress Notes (Signed)
OT Cancellation Note  Patient Details Name: Joshua Eaton MRN: [id number] DOB: 1956/07/25   Cancelled Treatment:    Reason Eval/Treat Not Completed: Patient at procedure or test/ unavailable (Currently in IR. Will continue to follow.)  Evern Bio 06/24/2023, 8:20 AM Berna Spare, OTR/L Acute Rehabilitation Services Office: [phone]

## 2023-06-24 NOTE — Progress Notes (Signed)
Occupational Therapy Treatment Patient Details Name: Joshua Eaton MRN: [id number] DOB: 1956-04-05 Today's Date: 06/24/2023   History of present illness Pt is 67 yo male admitted to [facility] 9/19 with SOB, intubated 9/20. Pt with continued ARDS and AKI, 10/3 transfer to [facility]. 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 10/22 weaning from vent 10/24 PEG placement PMHx: colon CA s/p Rt hemicolectomy, MVR, COPD   OT comments  Pt with spontaneous movement of UEs that appear non purposeful, but also decreased with mitts removed. Pt nodding head yes and no with some reliability, head movement for yes is slight. Continues to demonstrate full PROM B UEs. Placed in chair position with pt demonstrating head control and ability to turn head toward right and left. Total assist to pull trunk away from bed. Unable to elicit any command following this visit. VSS on 10 L 40% via trach collar. Will continue to follow.       If plan is discharge home, recommend the following:  Two people to help with walking and/or transfers;Two people to help with bathing/dressing/bathroom   Equipment Recommendations  Other (comment) (defer)    Recommendations for Other Services      Precautions / Restrictions Precautions Precautions: Fall;Other (comment) Precaution Comments: trach collar, PEG Restrictions Weight Bearing Restrictions: No       Mobility Bed Mobility Overal bed mobility: Needs Assistance Bed Mobility: Supine to Sit     Supine to sit: Total assist, HOB elevated     General bed mobility comments: from chair position, total assist to pull trunk away from bed    Transfers                   General transfer comment: unable     Balance                                           ADL either performed or assessed with clinical judgement   ADL                                          General ADL Comments: total care    Extremity/Trunk Assessment Upper Extremity Assessment RUE Deficits / Details: spontaneously moving UEs from elbow to hand, more when mitts are donned than when they are removed, pt not responsive to "squeeze my hand"            Vision       Perception     Praxis      Cognition Arousal: Alert Behavior During Therapy: Restless                                   General Comments: nodding head yes (slightly) and no (visibly), not following commands        Exercises Exercises: General Upper Extremity General Exercises - Upper Extremity Shoulder Flexion: PROM, Both, 5 reps    Shoulder Instructions       General Comments Pt demonstrating head control in chair position without pillow. Turned toward voice of wife and son.    Pertinent Vitals/ Pain       Pain Assessment Pain Assessment: Faces Faces Pain Scale: Hurts a little bit Pain  Location: back Pain Descriptors / Indicators: Grimacing, Discomfort Pain Intervention(s): Repositioned, Monitored during session  Home Living                                          Prior Functioning/Environment              Frequency  Min 1X/week        Progress Toward Goals  OT Goals(current goals can now be found in the care plan section)  Progress towards OT goals: Progressing toward goals  Acute Rehab OT Goals OT Goal Formulation: Patient unable to participate in goal setting Time For Goal Achievement: 06/29/23 Potential to Achieve Goals: Fair  Plan      Co-evaluation                 AM-PAC OT "6 Clicks" Daily Activity     Outcome Measure   Help from another person eating meals?: Total Help from another person taking care of personal grooming?: Total Help from another person toileting, which includes using toliet, bedpan, or urinal?: Total Help from another person bathing (including washing, rinsing, drying)?: Total Help from  another person to put on and taking off regular upper body clothing?: Total Help from another person to put on and taking off regular lower body clothing?: Total 6 Click Score: 6    End of Session Equipment Utilized During Treatment: Oxygen (10L 40%)  OT Visit Diagnosis: Muscle weakness (generalized) (M62.81);Other symptoms and signs involving the nervous system (R29.898);Other symptoms and signs involving cognitive function   Activity Tolerance Patient tolerated treatment well   Patient Left in bed;with call bell/phone within reach;with family/visitor present   Nurse Communication Other (comment) (response to activity)        Time: 1610-9604 OT Time Calculation (min): 28 min  Charges: OT General Charges $OT Visit: 1 Visit OT Treatments $Therapeutic Activity: 23-37 mins Berna Spare, OTR/L Acute Rehabilitation Services Office: [phone]   Evern Bio 06/24/2023, 12:28 PM

## 2023-06-25 DIAGNOSIS — J9601 Acute respiratory failure with hypoxia: Secondary | ICD-10-CM | POA: Diagnosis not present

## 2023-06-25 LAB — GLUCOSE, CAPILLARY
Glucose-Capillary: 113 mg/dL — ABNORMAL HIGH (ref 70–99)
Glucose-Capillary: 142 mg/dL — ABNORMAL HIGH (ref 70–99)
Glucose-Capillary: 142 mg/dL — ABNORMAL HIGH (ref 70–99)
Glucose-Capillary: 119 mg/dL — ABNORMAL HIGH (ref 70–99)
Glucose-Capillary: 123 mg/dL — ABNORMAL HIGH (ref 70–99)
Glucose-Capillary: 157 mg/dL — ABNORMAL HIGH (ref 70–99)

## 2023-06-25 LAB — RENAL FUNCTION PANEL
Albumin: 2.7 g/dL — ABNORMAL LOW (ref 3.5–5.0)
Anion gap: 10 (ref 5–15)
BUN: 50 mg/dL — ABNORMAL HIGH (ref 8–23)
CO2: 29 mmol/L (ref 22–32)
Calcium: 9.8 mg/dL (ref 8.9–10.3)
Chloride: 111 mmol/L (ref 98–111)
Creatinine, Ser: 0.82 mg/dL (ref 0.61–1.24)
GFR, Estimated: >60 mL/min (ref >60)
Glucose, Bld: 144 mg/dL — ABNORMAL HIGH (ref 70–99)
Phosphorus: 2.8 mg/dL (ref 2.5–4.6)
Potassium: 3.4 mmol/L — ABNORMAL LOW (ref 3.5–5.1)
Sodium: 150 mmol/L — ABNORMAL HIGH (ref 135–145)

## 2023-06-25 LAB — MAGNESIUM: Magnesium: 2.2 mg/dL (ref 1.7–2.4)

## 2023-06-25 MED ORDER — ORAL CARE MOUTH RINSE: 15.0000 mL | OROMUCOSAL | Status: DC | PRN

## 2023-06-25 MED ORDER — FUROSEMIDE 10 MG/ML IJ SOLN
80.0000 mg | Freq: Once | INTRAMUSCULAR | Status: AC
  Administered 2023-06-25: 80 mg via INTRAVENOUS
  Filled 2023-06-25: qty 8

## 2023-06-25 MED ORDER — METOLAZONE 2.5 MG PO TABS
5.0000 mg | ORAL_TABLET | Freq: Once | ORAL | Status: AC
  Administered 2023-06-25: 5 mg
  Filled 2023-06-25: qty 2

## 2023-06-25 MED ORDER — BANATROL TF EN LIQD
60.0000 mL | Freq: Three times a day (TID) | ENTERAL | Status: DC
  Administered 2023-06-25 – 2023-06-28 (×11): 60 mL
  Filled 2023-06-25 (×11): qty 60

## 2023-06-25 MED ORDER — POTASSIUM CHLORIDE 20 MEQ PO PACK
40.0000 meq | PACK | Freq: Once | ORAL | Status: AC
  Administered 2023-06-25: 40 meq
  Filled 2023-06-25: qty 2

## 2023-06-25 MED ORDER — ACETAZOLAMIDE SODIUM 500 MG IJ SOLR
500.0000 mg | Freq: Once | INTRAMUSCULAR | Status: AC
  Administered 2023-06-25: 500 mg via INTRAVENOUS
  Filled 2023-06-25 (×2): qty 500

## 2023-06-25 MED ORDER — ORAL CARE MOUTH RINSE
15.0000 mL | OROMUCOSAL | Status: DC
  Administered 2023-06-25 – 2023-06-28 (×15): 15 mL via OROMUCOSAL

## 2023-06-25 MED ORDER — MEDIHONEY WOUND/BURN DRESSING EX PSTE
1.0000 | PASTE | Freq: Every day | CUTANEOUS | Status: DC
  Administered 2023-06-25 – 2023-06-28 (×5): 1 via TOPICAL
  Filled 2023-06-25: qty 44

## 2023-06-25 NOTE — Plan of Care (Signed)
  Problem: Clinical Measurements: Goal: Will remain free from infection Outcome: Progressing Goal: Respiratory complications will improve Outcome: Progressing Goal: Cardiovascular complication will be avoided Outcome: Progressing   Problem: Elimination: Goal: Will not experience complications related to bowel motility Outcome: Progressing   Problem: Pain Managment: Goal: General experience of comfort will improve Outcome: Progressing   Problem: Safety: Goal: Ability to remain free from injury will improve Outcome: Progressing   Problem: Respiratory: Goal: Ability to maintain a clear airway and adequate ventilation will improve Outcome: Progressing

## 2023-06-25 NOTE — Consult Note (Signed)
WOC Nurse Consult Note: please see consult notes from 10/14 and 10/15 Reason for Consult: worsening sacral wound  Wound type:  1. Sacral stage 2 that has evolved to Unstageable  2.  L buttock Stage 3 Pressure Injury  3.  Full thickness wound below rectum that now has necrotic tissue present  Pressure Injury POA: Yes Measurement: 1.  Unstageable PI sacrum 3 cm x 2 cm 90% yellow brown necrotic tissue 10% pink  2.  L buttock stage 3 0.5 cm x 1 cm 50% pink moist 50% yellow necrotic  3.  Full thickness rectum 3 cm x 3 cm x 0.1 cm 50% pink moist 50% yellow  Wound bed: as above  Drainage (amount, consistency, odor) minimal tan exudate from all 3 wounds  Periwound: erythema, moisture associated skin damage  Dressing procedure/placement/frequency: Clean sacral and L buttock wound with Vashe wound cleanser, apply Medihoney to wound beds daily, cover with dry gauze and sacral foam. Continue to place Gerhardt's to surrounding skin buttocks and upper thighs. Clean rectal wound with Vashe wound cleanser, place a piece of Vashe moistened gauze to wound bed 2 times daily, cover with dry gauze and secure as above.   Patient should remain on low air loss mattress throughout hospitalization.  WOC team will not follow.  Re-consult if further needs arise.   Thank you,    Priscella Mann MSN, RN-BC, [employer] [phone]

## 2023-06-25 NOTE — Progress Notes (Signed)
Referring Provider(s): Blake,Sidney A  Supervising Physician: Mir, Secondary school teacher  Patient Status:  [facility] - In-pt  Chief Complaint:  F/U Gtube placement  Brief History:  Joshua Eaton is a 67 year old male with history of colon cancer (status post right hemicolectomy 2007), COPD, mitral valve regurgitation.  He presented to [facility] on 05/20/2023 for shortness of breath.   CXR showed bilateral airspace opacities and was eventually placed on BiPAP and admitted to ICU.   On 9/20 he was intubated due to ongoing respiratory failure and antibiotics were widened for multifocal pneumonia.   He was transferred to [facility] due to ongoing high vent requirements, unable to wean.   There was also concern for need for MV replacement and possible need for right heart cath. He is currently not a surgical candidate.  Goals of care discussed with family on 10/12 who wished to proceed with current care.  He underwent placement of a gastrostomy tube yesterday by Dr. Loreta Ave.   Subjective:  Awake. On Trach collar. Wife at bedside  Allergies: Codeine  Medications: Prior to Admission medications   Medication Sig Start Date End Date Taking? Authorizing Provider  acetaminophen (TYLENOL) 325 MG tablet Take 650 mg by mouth every 6 (six) hours as needed for moderate pain.   Yes [provider]  aspirin EC 81 MG tablet Take 81 mg by mouth daily. Swallow whole.   Yes [provider]  benzonatate (TESSALON) 200 MG capsule Take 200 mg by mouth 3 (three) times daily as needed for cough. 05/11/23  Yes [provider]  budesonide (PULMICORT) 0.5 MG/2ML nebulizer solution Take 0.5 mg by nebulization 2 (two) times daily.   Yes [provider]  chlorhexidine (PERIDEX) 0.12 % solution Use as directed 15 mLs in the mouth or throat 2 (two) times daily.   Yes [provider]  dexmedeTOMIDine HCl (PRECEDEX IV) Inject 1 Dose into the vein every 12 (twelve) hours.  in 100 mL 10.33mL/hr Q12H   Yes [provider]  feeding supplement (OSMOLITE 1 CAL) LIQD Take 1,000 mLs by mouth daily. 20mL/hr   Yes [provider]  fentaNYL citrate (PF) 10 mcg/mL in sodium chloride 0.9 % 200 mL Inject 50 mcg/hr into the vein See admin instructions. 2,564mcg in Rate: 55mL/hr Freq: Q12H   Yes [provider]  guaiFENesin-Codeine 200-20 MG/10ML SOLN Take 10 mLs by mouth every 6 (six) hours as needed (Cough).   Yes [provider]  ipratropium-albuterol (DUONEB) 0.5-2.5 (3) MG/3ML SOLN Take 3 mLs by nebulization every 6 (six) hours.   Yes [provider]  levofloxacin (LEVAQUIN) 750 MG/150ML SOLN Inject 750 mg into the vein daily. Rate: 114ml/hr Dur: 90 minutes Freq: Q24H Started 05/31/23 End 06/03/23   Yes [provider]  LORazepam (ATIVAN) 0.5 MG tablet Take 0.5 mg by mouth in the morning.   Yes [provider]  LORazepam (ATIVAN) 1 MG tablet Take 1 mg by mouth at bedtime.   Yes [provider]  LORazepam (ATIVAN) 2 MG/ML injection Inject 0.5 mg into the vein every 6 (six) hours as needed for anxiety.   Yes [provider]  methylPREDNISolone acetate (DEPO-MEDROL) 40 MG/ML injection 20 mg every 12 (twelve) hours. Started 05/29/23 End 06/12/23   Yes [provider]  METOCLOPRAMIDE HCL IJ Inject 10 mg into the vein every 6 (six) hours.   Yes [provider]  Multiple Vitamins-Minerals (MULTIVITAL PO) Take 5 mLs by mouth in the morning and at bedtime.  Yes [provider]  OLANZapine (ZYPREXA) 2.5 MG tablet Take 1 tablet (2.5 mg total) by mouth at bedtime. 01/20/23  Yes Cottle, Steva Ready., MD  ondansetron [facility]) 4 MG/2ML SOLN injection Inject 4 mg into the vein every 6 (six) hours as needed for nausea or vomiting.   Yes [provider]  pantoprazole 40 mg in sodium chloride 0.9 % 100 mL Inject 40 mg into the vein every 12 (twelve) hours.   Yes  [provider]  polyethylene glycol (MIRALAX / GLYCOLAX) 17 g packet Take 17 g by mouth 2 (two) times daily as needed for moderate constipation. Give with lunch and evening meal if no BM during the day as needed   Yes [provider]  promethazine (PHENERGAN) 25 MG/ML injection Inject 12.5 mg into the muscle every 6 (six) hours as needed for nausea or vomiting.   Yes [provider]  propofol (DIPRIVAN) 1000 MG/100ML EMUL injection Inject 2.939 mLs into the vein See admin instructions. Rate: 2.945mL/hr Dur: 34hr 2 min Freq: Every 6 hours   Yes [provider]  propranolol (INDERAL) 20 MG tablet TAKE 1 TO 2 TABLETS BY MOUTH  TWICE DAILY AS NEEDED 01/21/23  Yes Cottle, Steva Ready., MD  sennosides-docusate sodium (SENOKOT-S) 8.6-50 MG tablet Take 1 tablet by mouth 2 (two) times daily as needed for constipation.   Yes [provider]  zolpidem (AMBIEN) 5 MG tablet Take 5 mg by mouth at bedtime as needed for sleep.   Yes [provider]  doxycycline (VIBRAMYCIN) 100 MG capsule Take 100 mg by mouth 2 (two) times daily. Patient not taking: Reported on 06/05/2023 05/14/23   [provider]  gabapentin (NEURONTIN) 100 MG capsule Take 300 mg by mouth at bedtime. Patient not taking: Reported on 06/05/2023 12/11/15   [provider]  loratadine (CLARITIN) 10 MG tablet Take 10 mg by mouth daily as needed for allergies. Patient not taking: Reported on 06/05/2023 05/11/23   [provider]  meloxicam (MOBIC) 7.5 MG tablet Take 7.5 mg by mouth 2 (two) times daily. Patient not taking: Reported on 06/05/2023 04/04/23   [provider]  methocarbamol (ROBAXIN) 500 MG tablet Take 500 mg by mouth 2 (two) times daily as needed for muscle spasms. Patient not taking: Reported on 06/05/2023 04/04/23   [provider]  predniSONE (DELTASONE) 20 MG tablet Take 10-40 mg by mouth See admin instructions. AKE 2 TABLETS (40mg ) BY MOUTH ONCE  DAILY UNTIL SYMPTOMS IMPROVE, THEN DECREASE TO 1/2 TABLET (10mg ) EVERY OTHER DAY Patient not taking: Reported on 06/05/2023 05/14/23   [provider]  promethazine-dextromethorphan (PROMETHAZINE-DM) 6.25-15 MG/5ML syrup Take 5 mLs by mouth every 6 (six) hours as needed for cough. Patient not taking: Reported on 06/05/2023 05/11/23   [provider]  sertraline (ZOLOFT) 100 MG tablet TAKE 2 TABLETS BY MOUTH IN THE  MORNING AND 1 TABLET BY MOUTH IN THE EVENING 03/17/23   Cottle, Steva Ready., MD     Vital Signs: BP (!) 146/75   Pulse (!) 107   Temp 99.3 F (37.4 C) (Axillary)   Resp (!) 30   Ht 6' 0.99" (1.854 m)   Wt 187 lb 2.7 oz (84.9 kg)   SpO2 100%   BMI 24.70 kg/m   Physical Exam Awake, NAD Trach collar Heart RRR Abdomen with Gastrostomy tube in place, site looks good. Tube feeds going without issues.  Labs:  CBC: Recent Labs    06/19/23 0308 06/20/23 5784  06/21/23 0300 06/23/23 0531  WBC 8.4 6.5 7.7 5.7  HGB 9.9* 8.9* 9.7* 8.9*  HCT 30.2* 27.6* 32.1* 30.4*  PLT 150 115* 136* 135*    COAGS: Recent Labs    06/19/23 0308  INR 1.1  APTT 31    BMP: Recent Labs    06/22/23 2250 06/23/23 0531 06/24/23 0504 06/25/23 0530  NA 149* 144 148* 150*  K 3.8 5.1 3.4* 3.4*  CL 110 109 112* 111  CO2 30 27 28 29   GLUCOSE 126* 214* 115* 144*  BUN 65* 59* 47* 50*  CALCIUM 9.7 8.9 9.3 9.8  CREATININE 0.88 0.86 0.93 0.82  GFRNONAA >60 >60 >60 >60    LIVER FUNCTION TESTS: Recent Labs    06/09/23 0829 06/11/23 0335 06/13/23 0328 06/14/23 0553 06/25/23 0530  BILITOT 1.5* 1.3* 1.0 1.1  --   AST 30 38 31 28  --   ALT 18 30 25 24   --   ALKPHOS 48 49 46 45  --   PROT 5.0* 5.2* 5.5* 5.9*  --   ALBUMIN 1.8* 1.9* 1.9* 2.0* 2.7*    Assessment and Plan:  Prolonged NPO status - s/p gastrostomy tube yesterday by Dr. Loreta Ave.  Routine Gtube care per protocol.  Electronically Signed: Gwynneth Macleod, PA-C 06/25/2023, 3:18 PM    I spent a total  of 15 Minutes at the the patient's bedside AND on the patient's hospital floor or unit, greater than 50% of which was counseling/coordinating care for f/u Gtube.

## 2023-06-25 NOTE — Progress Notes (Addendum)
NAME:  Joshua Eaton, MRN:  [id number], DOB:  Dec 20, 1955, LOS: 22 ADMISSION DATE:  06/03/2023, CHIEF COMPLAINT:  Respiratory failure   History of Present Illness:   This is a 67 year old male with a past medical history of colon cancer status post right hemicolectomy 2007, COPD, mitral valve Regurgitation who presented to [facility] health on 05/20/2023 for shortness of breath. Prior to presenting, patient was feeling unwell for about 6 days with cough, shortness of breath. He had an initial CXR showing bilateral airspace opacities and was eventually placed on BiPAP and admitted to ICU. On 9/20 he was intubated due to ongoing respiratory failure and antibiotics were widened for multifocal pneumonia. He had echo performed showing EF 60-65%, severe dilated LA, severe MR. Throughout his course at [facility] had ongoing ARDS picture, developed worsening AKI. Eventually, [facility] PCCM team was consulted for transfer here due to ongoing high vent requirements, unable to wean. Additionally, there was concern over need for MV replacement and possible need for right heart cath.    Pertinent  Medical History  Colon cancer, status post hemicolectomy (right) 2007, questionable COPD wears CPAP at home, mitral valve regurgitation   Significant Hospital Events: Including procedures, antibiotic start and stop dates in addition to other pertinent events   Admitted to [facility] health on 05/20/2023 06/03/2023: Transferred from [facility] to [facility] MRSA PCR negative 06/04/2023 Tracheal aspirate no growth 10/5 failed SBT 10/06: Tracheostomy  06/08/2023 Blood culture no growth as of 06/13/2023 06/09/2023: TEE performed at bedside 10/10: Mitral valve perforation on echo. Cardiology discussed limited options Tracheal aspirate normal for 10/11: CTS consulted 10/12: GOC with family yesterday. Currently not surgical candidate. Agreed to continue current care, Good UOP with diuresis, Diarrhea unchanged, Tolerating PS C.  difficile screen negative 10/18: overnight with cuff leak, trach evaluated, re-intubated from above, trach revision  10/19: doing well on SBT, sedation switched to precedex, plan for trach collar 10/20: arouses off sedation, tolerated SBT, trial of trach collar 10/21: Transferred back to vent 2/2 significant secretions 10/24: placed on trach collar, improving mentation (follows some commands) 10/25: remains on trach collar, answering yes/no questions  Interim History / Subjective:  Continued improvement in mentation.  He is answering yes/no questions and continues to follow more commands.  He is doing well on trach collar for over 24 hours.  Objective   Blood pressure 115/89, pulse 96, temperature 98.4 F (36.9 C), temperature source Axillary, resp. rate (!) 25, height 6' 0.99" (1.854 m), weight 84.9 kg, SpO2 100%.    FiO2 (%):  [40 %] 40 %   Intake/Output Summary (Last 24 hours) at 06/25/2023 0611 Last data filed at 06/25/2023 0500 Gross per 24 hour  Intake 1610 ml  Output 2500 ml  Net -890 ml   Filed Weights   06/23/23 0500 06/24/23 0500 06/25/23 0500  Weight: 77.9 kg 84.9 kg 84.9 kg    Examination: Constitutional: Ill-appearing elderly male.   Eyes: Sclera non-icteric, PERRL Neck: Trach collar in place with dried blood and dark mucus surrounding tracheostomy site Cardio: Regular rate and rhythm.  Very prominent systolic murmur over the apex. Pulm: Coarse breath sounds in the anterior fields  Abdomen: Soft, non-tender, non-distended, positive bowel sounds. PEG tube in place MSK: mild left foot edema Skin:Warm and dry. Neuro: Awake and alert.  Recognizes name.  Follow most simple commands.  Answers yes/no questions.  Continued questionable nonpurposeful movement of bilateral upper extremities.  Attempts at verbal communication.  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-consulted cardiology and cardiothoracic surgery, not a surgical candidate HTN A-fib with RVR PLAN -Resume diuresis -Hydralazine 50 mg 3 times daily and amlodipine 5 mg daily -Continue amiodarone load and then 200 mg daily  #Acute hypoxemic respiratory failure status post tracheostomy still requiring mechanical ventilation #Pulmonary edema secondary to HFpEF exacerbation #Severe mitral regurgitation #Aortic valve insufficiency #Multifocal pneumonia  PLAN -Doing well 24 hours on trach collar, plan to transfer out of unit if he is stable on trach collar into the night -tracheostomy revision performed 10/18, now with 8.0 Shiley proximal XLT -VAP bundle, stress ulcer prophylaxis -s/p 7 days zosyn (end date 10/16) -SLP for speaking valve   Acute metabolic encephalopathy 2/2 critical illness No current etiology determined.  CT and MRI of head negative for possible cause.  No known liver disease and ammonia normal during admission.  Added home benzodiazepine and started high-dose thiamine.  Continues to make progressive improvements day by day. -Continue lorazepam 0.5 mg in the a.m. and 1 mg in the p.m. and high-dose thiamine  #Fevers - RUQ neg. Resp culture with OP flora.  Blood cultures neg. Foley discontinued. Concern aspiration. ID consult. C Diff negative.  Improved with initiation of Zosyn for presumed aspiration pneumonia. -Zosyn x  7 days through 06/16/23 lead to defervescence  -UE DVT on Dopplers 10/16   # Upper extremity DVT: -DOAC  #Macrocytic anemia #Thrombocytopenia -- Stable   #Severe protein calorie malnutrition  -Continue tube feeds.  PEG tube 10/24   #BPH Great urine output.  No concerns. -Continue on external urinary catheter   #Hyperbilirubinemia -Resolved   #Hypernatremia: Stable between 146-150. -continue to monitor while we diuresis  DISPO Stable for transfer to progressive in  the morning if he continues to do well on trach collar today.  Will reach out to patient placement as we expect him to continue to do well on trach collar.  Long-term his appeal for LTAC and is ongoing and his preference is select..  Pulmonary medicine will continue to follow intermittently and will see the patient again on 10/28 if transferred out in the AM.  Best Practice (right click and "Reselect all SmartList Selections" daily)   Diet/type: tubefeeds DVT prophylaxis: systemic dose LMWH GI prophylaxis: PPI Lines: Central line and yes and it is still needed Foley:  N/A Code Status:  full code Last date of multidisciplinary goals of care discussion [06/21/2023]  Labs   CBC: Recent Labs  Lab 06/18/23 1025 06/19/23 0308 06/20/23 0312 06/21/23 0300 06/23/23 0531  WBC  --  8.4 6.5 7.7 5.7  NEUTROABS  --   --   --   --  3.4  HGB 12.2* 9.9* 8.9* 9.7* 8.9*  HCT 36.0* 30.2* 27.6* 32.1* 30.4*  MCV  --  102.7* 102.6* 105.2* 107.0*  PLT  --  150 115* 136* 135*    Basic Metabolic Panel: Recent Labs  Lab 06/19/23 1935 06/20/23 0312 06/20/23 1711 06/21/23 0300 06/22/23 0411 06/22/23 1358 06/22/23 2250 06/23/23 0531 06/24/23 0504  NA  --  144  --    < > 150* 150* 149* 144 148*  K  --  3.8  --    < > 3.6 3.6 3.8 5.1 3.4*  CL  --  107  --    < > 106 110 110 109 112*  CO2  --  28  --    < >  34* 32 30 27 28   GLUCOSE  --  148*  --    < > 145* 149* 126* 214* 115*  BUN  --  66*  --    < > 60* 66* 65* 59* 47*  CREATININE  --  0.95  --    < > 0.87 0.85 0.88 0.86 0.93  CALCIUM  --  8.0*  --    < > 9.8 9.2 9.7 8.9 9.3  MG 2.8* 2.9* 2.8*  --   --   --  2.6* 2.5*  --    < > = values in this interval not displayed.   GFR: Estimated Creatinine Clearance: 84.7 mL/min (by C-G formula based on SCr of 0.93 mg/dL). Recent Labs  Lab 06/19/23 0308 06/19/23 1820 06/19/23 1935 06/20/23 0312 06/21/23 0300 06/23/23 0531  WBC 8.4  --   --  6.5 7.7 5.7  LATICACIDVEN  --  0.9 1.0  --   --   --      Liver Function Tests: No results for input(s): "AST", "ALT", "ALKPHOS", "BILITOT", "PROT", "ALBUMIN" in the last 168 hours.  No results for input(s): "LIPASE", "AMYLASE" in the last 168 hours.  No results for input(s): "AMMONIA" in the last 168 hours.  ABG    Component Value Date/Time   PHART 7.451 (H) 06/18/2023 1025   PCO2ART 46.5 06/18/2023 1025   PO2ART 142 (H) 06/18/2023 1025   HCO3 32.3 (H) 06/18/2023 1025   TCO2 34 (H) 06/18/2023 1025   O2SAT 99 06/18/2023 1025     Coagulation Profile: Recent Labs  Lab 06/19/23 0308  INR 1.1    Cardiac Enzymes: No results for input(s): "CKTOTAL", "CKMB", "CKMBINDEX", "TROPONINI" in the last 168 hours.  HbA1C: Hgb A1c MFr Bld  Date/Time Value Ref Range Status  12/09/2015 06:30 PM 5.1 4.8 - 5.6 % Final    Comment:    (NOTE)         Pre-diabetes: 5.7 - 6.4         Diabetes: >6.4         Glycemic control for adults with diabetes: <7.0     CBG: Recent Labs  Lab 06/24/23 1108 06/24/23 1532 06/24/23 1954 06/24/23 2335 06/25/23 0350  GLUCAP 130* 128* 124* 146* 113*    Review of Systems:   Unable to obtain  Past Medical History:  He,  has a past medical history of Anxiety, Cancer (HCC), Depression, Hypertension, and OCD (obsessive compulsive disorder).   Surgical History:   Past Surgical History:  Procedure Laterality Date   CHOLECYSTECTOMY     COLON SURGERY     cancerous polyp removed   HERNIA REPAIR     IR GASTROSTOMY TUBE MOD SED  06/24/2023     Social History:   reports that he has never smoked. He has never used smokeless tobacco. He reports that he does not drink alcohol and does not use drugs.   Family History:  His family history is not on file.   Allergies Allergies  Allergen Reactions   Codeine     "sends me into orbit"     Home Medications  Prior to Admission medications   Medication Sig Start Date End Date Taking? Authorizing Provider  acetaminophen (TYLENOL) 325 MG tablet Take 650 mg  by mouth every 6 (six) hours as needed for moderate pain.   Yes [provider]  aspirin EC 81 MG tablet Take 81 mg by mouth daily. Swallow whole.   Yes [provider]  benzonatate (TESSALON)  200 MG capsule Take 200 mg by mouth 3 (three) times daily as needed for cough. 05/11/23  Yes [provider]  budesonide (PULMICORT) 0.5 MG/2ML nebulizer solution Take 0.5 mg by nebulization 2 (two) times daily.   Yes [provider]  chlorhexidine (PERIDEX) 0.12 % solution Use as directed 15 mLs in the mouth or throat 2 (two) times daily.   Yes [provider]  dexmedeTOMIDine HCl (PRECEDEX IV) Inject 1 Dose into the vein every 12 (twelve) hours. in 100 mL 10.36mL/hr Q12H   Yes [provider]  feeding supplement (OSMOLITE 1 CAL) LIQD Take 1,000 mLs by mouth daily. 74mL/hr   Yes [provider]  fentaNYL citrate (PF) 10 mcg/mL in sodium chloride 0.9 % 200 mL Inject 50 mcg/hr into the vein See admin instructions. 2,571mcg in Rate: 32mL/hr Freq: Q12H   Yes [provider]  guaiFENesin-Codeine 200-20 MG/10ML SOLN Take 10 mLs by mouth every 6 (six) hours as needed (Cough).   Yes [provider]  ipratropium-albuterol (DUONEB) 0.5-2.5 (3) MG/3ML SOLN Take 3 mLs by nebulization every 6 (six) hours.   Yes [provider]  levofloxacin (LEVAQUIN) 750 MG/150ML SOLN Inject 750 mg into the vein daily. Rate: 151ml/hr Dur: 90 minutes Freq: Q24H Started 05/31/23 End 06/03/23   Yes [provider]  LORazepam (ATIVAN) 0.5 MG tablet Take 0.5 mg by mouth in the morning.   Yes [provider]  LORazepam (ATIVAN) 1 MG tablet Take 1 mg by mouth at bedtime.   Yes [provider]  LORazepam (ATIVAN) 2 MG/ML injection Inject 0.5 mg into the vein every 6 (six) hours as needed for anxiety.   Yes [provider]  methylPREDNISolone acetate (DEPO-MEDROL) 40 MG/ML injection 20 mg every 12 (twelve)  hours. Started 05/29/23 End 06/12/23   Yes [provider]  METOCLOPRAMIDE HCL IJ Inject 10 mg into the vein every 6 (six) hours.   Yes [provider]  Multiple Vitamins-Minerals (MULTIVITAL PO) Take 5 mLs by mouth in the morning and at bedtime.   Yes [provider]  OLANZapine (ZYPREXA) 2.5 MG tablet Take 1 tablet (2.5 mg total) by mouth at bedtime. 01/20/23  Yes Cottle, Steva Ready., MD  ondansetron [facility]) 4 MG/2ML SOLN injection Inject 4 mg into the vein every 6 (six) hours as needed for nausea or vomiting.   Yes [provider]  pantoprazole 40 mg in sodium chloride 0.9 % 100 mL Inject 40 mg into the vein every 12 (twelve) hours.   Yes [provider]  polyethylene glycol (MIRALAX / GLYCOLAX) 17 g packet Take 17 g by mouth 2 (two) times daily as needed for moderate constipation. Give with lunch and evening meal if no BM during the day as needed   Yes [provider]  promethazine (PHENERGAN) 25 MG/ML injection Inject 12.5 mg into the muscle every 6 (six) hours as needed for nausea or vomiting.   Yes [provider]  propofol (DIPRIVAN) 1000 MG/100ML EMUL injection Inject 2.939 mLs into the vein See admin instructions. Rate: 2.932mL/hr Dur: 34hr 2 min Freq: Every 6 hours   Yes [provider]  propranolol (INDERAL) 20 MG tablet TAKE 1 TO 2 TABLETS BY MOUTH  TWICE DAILY AS NEEDED 01/21/23  Yes Cottle, Steva Ready., MD  sennosides-docusate sodium (SENOKOT-S) 8.6-50 MG tablet Take 1 tablet by mouth 2 (two) times daily as needed for constipation.   Yes [provider]  zolpidem (AMBIEN) 5 MG tablet Take  5 mg by mouth at bedtime as needed for sleep.   Yes [provider]  doxycycline (VIBRAMYCIN) 100 MG capsule Take 100 mg by mouth 2 (two) times daily. Patient not taking: Reported on 06/05/2023 05/14/23   [provider]  gabapentin (NEURONTIN) 100 MG capsule Take 300 mg by mouth at bedtime. Patient not  taking: Reported on 06/05/2023 12/11/15   [provider]  loratadine (CLARITIN) 10 MG tablet Take 10 mg by mouth daily as needed for allergies. Patient not taking: Reported on 06/05/2023 05/11/23   [provider]  meloxicam (MOBIC) 7.5 MG tablet Take 7.5 mg by mouth 2 (two) times daily. Patient not taking: Reported on 06/05/2023 04/04/23   [provider]  methocarbamol (ROBAXIN) 500 MG tablet Take 500 mg by mouth 2 (two) times daily as needed for muscle spasms. Patient not taking: Reported on 06/05/2023 04/04/23   [provider]  predniSONE (DELTASONE) 20 MG tablet Take 10-40 mg by mouth See admin instructions. AKE 2 TABLETS (40mg ) BY MOUTH ONCE DAILY UNTIL SYMPTOMS IMPROVE, THEN DECREASE TO 1/2 TABLET (10mg ) EVERY OTHER DAY Patient not taking: Reported on 06/05/2023 05/14/23   [provider]  promethazine-dextromethorphan (PROMETHAZINE-DM) 6.25-15 MG/5ML syrup Take 5 mLs by mouth every 6 (six) hours as needed for cough. Patient not taking: Reported on 06/05/2023 05/11/23   [provider]  sertraline (ZOLOFT) 100 MG tablet TAKE 2 TABLETS BY MOUTH IN THE  MORNING AND 1 TABLET BY MOUTH IN THE EVENING 03/17/23   Cottle, Steva Ready., MD     Critical care time: 30 minutes    Rocky Morel, DO Internal Medicine Resident, PGY-2 Pager# [phone] [facility] 06/25/2023 6:11 AM   For contact information, see Amion. If no response to pager, please call PCCM consult pager. After hours, 7PM- 7AM, please call Elink.

## 2023-06-26 DIAGNOSIS — J9601 Acute respiratory failure with hypoxia: Secondary | ICD-10-CM | POA: Diagnosis not present

## 2023-06-26 LAB — RENAL FUNCTION PANEL
Albumin: 2.6 g/dL — ABNORMAL LOW (ref 3.5–5.0)
Anion gap: 9 (ref 5–15)
BUN: 65 mg/dL — ABNORMAL HIGH (ref 8–23)
CO2: 29 mmol/L (ref 22–32)
Calcium: 9.7 mg/dL (ref 8.9–10.3)
Chloride: 114 mmol/L — ABNORMAL HIGH (ref 98–111)
Creatinine, Ser: 1.15 mg/dL (ref 0.61–1.24)
GFR, Estimated: >60 mL/min (ref >60)
Glucose, Bld: 178 mg/dL — ABNORMAL HIGH (ref 70–99)
Phosphorus: 4.8 mg/dL — ABNORMAL HIGH (ref 2.5–4.6)
Potassium: 4 mmol/L (ref 3.5–5.1)
Sodium: 152 mmol/L — ABNORMAL HIGH (ref 135–145)

## 2023-06-26 LAB — COMPREHENSIVE METABOLIC PANEL
ALT: 25 U/L (ref 0–44)
AST: 36 U/L (ref 15–41)
Albumin: 2.6 g/dL — ABNORMAL LOW (ref 3.5–5.0)
Alkaline Phosphatase: 58 U/L (ref 38–126)
Anion gap: 10 (ref 5–15)
BUN: 77 mg/dL — ABNORMAL HIGH (ref 8–23)
CO2: 29 mmol/L (ref 22–32)
Calcium: 9.8 mg/dL (ref 8.9–10.3)
Chloride: 112 mmol/L — ABNORMAL HIGH (ref 98–111)
Creatinine, Ser: 1.02 mg/dL (ref 0.61–1.24)
GFR, Estimated: >60 mL/min (ref >60)
Glucose, Bld: 141 mg/dL — ABNORMAL HIGH (ref 70–99)
Potassium: 3.5 mmol/L (ref 3.5–5.1)
Sodium: 151 mmol/L — ABNORMAL HIGH (ref 135–145)
Total Bilirubin: 0.9 mg/dL (ref 0.3–1.2)
Total Protein: 6.3 g/dL — ABNORMAL LOW (ref 6.5–8.1)

## 2023-06-26 LAB — GLUCOSE, CAPILLARY
Glucose-Capillary: 136 mg/dL — ABNORMAL HIGH (ref 70–99)
Glucose-Capillary: 178 mg/dL — ABNORMAL HIGH (ref 70–99)
Glucose-Capillary: 136 mg/dL — ABNORMAL HIGH (ref 70–99)
Glucose-Capillary: 158 mg/dL — ABNORMAL HIGH (ref 70–99)
Glucose-Capillary: 157 mg/dL — ABNORMAL HIGH (ref 70–99)
Glucose-Capillary: 126 mg/dL — ABNORMAL HIGH (ref 70–99)

## 2023-06-26 MED ORDER — FREE WATER
200.0000 mL | Freq: Four times a day (QID) | Status: DC
  Administered 2023-06-26 – 2023-06-28 (×9): 200 mL

## 2023-06-26 MED ORDER — CLONAZEPAM 1 MG PO TABS: 2.0000 mg | ORAL_TABLET | Freq: Every day | ORAL | Status: DC

## 2023-06-26 MED ORDER — QUETIAPINE FUMARATE 50 MG PO TABS
50.0000 mg | ORAL_TABLET | Freq: Two times a day (BID) | ORAL | Status: DC
  Administered 2023-06-26 – 2023-06-27 (×2): 50 mg
  Filled 2023-06-26 (×2): qty 1

## 2023-06-26 MED ORDER — METOPROLOL SUCCINATE 12.5 MG HALF TABLET
12.5000 mg | ORAL_TABLET | Freq: Every day | ORAL | Status: DC
  Administered 2023-06-26 – 2023-06-27 (×2): 12.5 mg via ORAL
  Filled 2023-06-26 (×3): qty 1

## 2023-06-26 MED ORDER — GABAPENTIN 250 MG/5ML PO SOLN: 300.0000 mg | Freq: Every day | ORAL | Status: DC

## 2023-06-26 MED ORDER — CLONAZEPAM 1 MG PO TABS
1.0000 mg | ORAL_TABLET | Freq: Every day | ORAL | Status: DC
  Administered 2023-06-26 – 2023-06-27 (×2): 1 mg
  Filled 2023-06-26 (×2): qty 1

## 2023-06-26 MED ORDER — FENTANYL CITRATE PF 50 MCG/ML IJ SOSY: 25.0000 ug | PREFILLED_SYRINGE | INTRAMUSCULAR | Status: DC | PRN

## 2023-06-26 NOTE — Plan of Care (Signed)
  Problem: Activity: Goal: Risk for activity intolerance will decrease Outcome: Progressing   Problem: Nutrition: Goal: Adequate nutrition will be maintained Outcome: Progressing   Problem: Coping: Goal: Level of anxiety will decrease Outcome: Progressing   Problem: Respiratory: Goal: Ability to maintain a clear airway and adequate ventilation will improve Outcome: Progressing

## 2023-06-26 NOTE — Progress Notes (Signed)
RT took pt off ventilator and placed pt on ATC 10L/40% with cuff deflated. Pt is tolerating it well at this time with stable VS. Vent @ bedside. RN notified.

## 2023-06-26 NOTE — Progress Notes (Addendum)
HOSPITALIST ROUNDING NOTE Joshua Eaton [id number]  DOB: May 09, 1956  DOA: 06/03/2023  PCP: Kaleen Mask, MD  06/26/2023,8:33 AM   LOS: 23 days      Code Status: Remains full code From: Home  current Dispo:?  LTAC     67 year old white male Prior OCD with hospitalization back in 2017 continues to follow with behavioral health Colon cancer with right hemicolectomy in 2007,?  COPD wears CPAP at home Mitral valve repair? Admit 05/20/2023 SOB DOE-admitted to the ICU intubated 9/20 Developed ARDS picture at OSH [facility]) and given high vent requirements transferred to [facility] 9/19 echo = severe MR moderate AI 10/6 tracheostomy 10/9 TEE performed-cardiology consulted severe MR based on echo versus mitral valve perforation with very limited options per cardiology 10/11 CT surgery consulted felt patient very poor candidate for any type of valvular repair 10/12 goals of care performed C. 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 Leafy Ro CVTS--good discussion with family at bedside explaining poor candidacy currently--- may eventually if recovers at [facility] be a candidate? A-fib RVR -tapering amiodarone 200 twice daily to 200 daily on 10/31, add metoprolol XL 12.5 in feeding tube--Eliquis 5 twice daily per tube Check a.m. mag Diuresis has been held his albumin is low and we will have to keep his volume status balanced  Hypoxic respiratory failure requiring occasional mechanical ventilation  multifocal pneumonia-completed 7 days of Zosyn ending 10/16 Last CXR 10/24 patchy parenchymal lung  densities No further workup at this time monitor secretions aggressively-discussed overall prognosis with family with high risk of recurrent secretions causing issues  Hypernatremia---Azotemia Increase free water to 200 every 6 repeat labs a.m.--has been azotemic since admission Received Diamox 10/25-no further need  VTE with upper extremity DVT Has PICC line in the right side which may need to be removed-Will discuss with CCM Mild thrombocytopenia with macrocytic anemia Continue apixaban 5 twice daily short-term likely 1 month and discontinue after that   Some ICU related delirium? Metabolic encephalopathy of critical illness Intermittent but likely related also to the sedatives and meds as above Minimize fentanyl, discontinue daytime Klonopin, discontinue restraints-only use low-dose Klonopin 1 mg per tube at bedtime Discontinue gabapentin completely--- wean Seroquel from 100-->50 twice daily and discontinue eventually  Severe protein energy malnutrition-PEG tube placed 10/24 Is on feeds with Banatrol 60 3 times daily, continue other tube feeds Feed induced diarrhea-hold Lomotil, paradoxically hold MiraLAX additionally Continues on Jevity continuously Prosource as well  Anemia of critical illness-macrocytic anemia Watch platelets they are low normal-but are improved from admission baseline hemoglobin in 2017 was 16 range--care with anticoagulation  Decubitus Pressure injury stage III and anus MASD on buttocks pressure injury on sacrum Turn as best possible manuka honey further dressings etc.  Discussed in detail with son and wife at bedside-mentioned that his prognosis is relatively poor Mentioned that it would be day by day assessments of his mentation   DVT prophylaxis: Eliquis twice daily  Status is: Inpatient Remains inpatient appropriate because:   Requires LTAC not ready to discharge until we have a bed      Subjective: Sleepy, poorly arousable but eyes open  received fentanyl at around 4 AM and then had to be put back on the vent because he was encephalopathic Remove restraints this family's care He is not really making whole lot of meaningful sense but yesterday apparently he was talking with his wife   Objective + exam Vitals:   06/26/23 0500 06/26/23 0600 06/26/23 0716 06/26/23 0800  BP: 132/65 129/62  (!) 155/68  Pulse: 95 94  (!) 112  Resp: 14 16  (!) 27  Temp:   98.9 F (37.2 C)   TempSrc:   Axillary   SpO2: 100% 100%  94%  Weight: 84.9 kg     Height:       Filed Weights   06/24/23 0500 06/25/23 0500 06/26/23 0500  Weight: 84.9 kg 84.9 kg 84.9 kg    Examination:  EOMI NCAT S1-S2 no murmur sinus tach on monitors Chest seems relatively clear anteriorly PEG tube in place PICC line in right upper extremity in place He has Prevalon boots on I did not examine the rest of his integumentary he has a bruise on one of his knees I do not appreciate gross edema   Data Reviewed: reviewed   CBC    Component Value Date/Time   WBC 5.7 06/23/2023 0531   RBC 2.84 (L) 06/23/2023 0531   HGB 8.9 (L) 06/23/2023 0531   HCT 30.4 (L) 06/23/2023 0531   PLT 135 (L) 06/23/2023 0531   MCV 107.0 (H) 06/23/2023 0531   MCH 31.3 06/23/2023 0531   MCHC 29.3 (L) 06/23/2023 0531   RDW 18.8 (H) 06/23/2023 0531   LYMPHSABS 1.5 06/23/2023 0531   MONOABS 0.6 06/23/2023 0531   EOSABS 0.1 06/23/2023 0531   BASOSABS 0.0 06/23/2023 0531      Latest Ref Rng & Units 06/25/2023    5:30 AM 06/24/2023    5:04 AM 06/23/2023    5:31 AM  CMP  Glucose 70 - 99 mg/dL 409  811  914   BUN 8 - 23 mg/dL 50  47  59   Creatinine 0.61 - 1.24 mg/dL 7.82  9.56  2.13   Sodium 135 - 145 mmol/L 150  148  144   Potassium 3.5 - 5.1 mmol/L 3.4  3.4  5.1   Chloride 98 - 111 mmol/L 111  112  109   CO2 22 - 32 mmol/L 29  28  27    Calcium 8.9 - 10.3 mg/dL 9.8  9.3  8.9      Scheduled Meds:  acetaminophen  1,000 mg Per Tube Q8H   amiodarone  200 mg Per Tube BID    Followed by   Melene Muller ON 07/01/2023] amiodarone  200 mg Per Tube Daily   apixaban  5 mg Per Tube BID   Chlorhexidine Gluconate Cloth  6 each Topical Daily   clonazePAM  0.5 mg Per Tube Daily   clonazePAM  2 mg Per Tube QHS   feeding supplement (PROSource TF20)  60 mL Per Tube Daily   fiber supplement (BANATROL TF)  60 mL Per Tube TID   folic acid  1 mg Per Tube Daily   gabapentin  300 mg Per Tube QHS   Gerhardt's butt cream   Topical BID   insulin aspart  2 Units Subcutaneous Q4H   insulin aspart  2-6 Units Subcutaneous Q4H   leptospermum manuka honey  1 Application Topical Daily   multivitamin with minerals  1 tablet Per Tube Daily   neomycin-bacitracin-polymyxin  1 Application Topical Daily   nutrition supplement (  JUVEN)  1 packet Per Tube BID BM   mouth rinse  15 mL Mouth Rinse 4 times per day   pantoprazole (PROTONIX) IV  40 mg Intravenous Q24H   QUEtiapine  100 mg Per Tube BID   sodium chloride flush  10-40 mL Intracatheter Q12H   thiamine  100 mg Per Tube Daily   Continuous Infusions:  feeding supplement (JEVITY 1.5 CAL/FIBER) 65 mL/hr at 06/26/23 0800    Time  68  Rhetta Mura, MD  [facility]

## 2023-06-26 NOTE — Progress Notes (Signed)
NAME:  Joshua Eaton, MRN:  [id number], DOB:  05/06/1956, LOS: 23 ADMISSION DATE:  06/03/2023, CHIEF COMPLAINT:  Respiratory failure   History of Present Illness:   This is a 67 year old male with a past medical history of colon cancer status post right hemicolectomy 2007, COPD, mitral valve Regurgitation who presented to [facility] health on 05/20/2023 for shortness of breath. Prior to presenting, patient was feeling unwell for about 6 days with cough, shortness of breath. He had an initial CXR showing bilateral airspace opacities and was eventually placed on BiPAP and admitted to ICU. On 9/20 he was intubated due to ongoing respiratory failure and antibiotics were widened for multifocal pneumonia. He had echo performed showing EF 60-65%, severe dilated LA, severe MR. Throughout his course at [facility] had ongoing ARDS picture, developed worsening AKI. Eventually, [facility] PCCM team was consulted for transfer here due to ongoing high vent requirements, unable to wean. Additionally, there was concern over need for MV replacement and possible need for right heart cath.    Pertinent  Medical History  Colon cancer, status post hemicolectomy (right) 2007, questionable COPD wears CPAP at home, mitral valve regurgitation   Significant Hospital Events: Including procedures, antibiotic start and stop dates in addition to other pertinent events   Admitted to [facility] health on 05/20/2023 06/03/2023: Transferred from [facility] to [facility] MRSA PCR negative 06/04/2023 Tracheal aspirate no growth 10/5 failed SBT 10/06: Tracheostomy  06/08/2023 Blood culture no growth as of 06/13/2023 06/09/2023: TEE performed at bedside 10/10: Mitral valve perforation on echo. Cardiology discussed limited options Tracheal aspirate normal for 10/11: CTS consulted 10/12: GOC with family yesterday. Currently not surgical candidate. Agreed to continue current care, Good UOP with diuresis, Diarrhea unchanged, Tolerating PS C.  difficile screen negative 10/18: overnight with cuff leak, trach evaluated, re-intubated from above, trach revision  10/19: doing well on SBT, sedation switched to precedex, plan for trach collar 10/20: arouses off sedation, tolerated SBT, trial of trach collar 10/21: Transferred back to vent 2/2 significant secretions 10/24: placed on trach collar, improving mentation (follows some commands) 10/25: remains on trach collar, answering yes/no questions  Interim History / Subjective:  Agitated overnight so placed back on vent?  Objective   Blood pressure 129/62, pulse 94, temperature 98.9 F (37.2 C), temperature source Axillary, resp. rate 16, height 6' 0.99" (1.854 m), weight 84.9 kg, SpO2 100%.    Vent Mode: PRVC FiO2 (%):  [30 %-40 %] 40 % Set Rate:  [15 bmp] 15 bmp Vt Set:  [630 mL] 630 mL PEEP:  [5 cmH20] 5 cmH20 Plateau Pressure:  [17 cmH20] 17 cmH20   Intake/Output Summary (Last 24 hours) at 06/26/2023 0726 Last data filed at 06/26/2023 0700 Gross per 24 hour  Intake 1680 ml  Output 1850 ml  Net -170 ml   Filed Weights   06/24/23 0500 06/25/23 0500 06/26/23 0500  Weight: 84.9 kg 84.9 kg 84.9 kg    Examination: Follows commands Agitated Lungs with occasional rhonci Triggers vent Moves all 4 ext +murmur Minimal edema  Labs pending  Resolved Hospital Problem list   AKI GI bleed  Hypophosphatemia  Assessment & Plan:   Acute HFpEF 2/2 severe MR and moderate AI Valve destruction of mitral valve with perforation of anterior mitral valve leaflet-consulted cardiology and cardiothoracic surgery, not a surgical candidate HTN A-fib with RVR PLAN -f/u BMP then diurese PRN -Amio, Hydralazine 50 mg 3 times daily and amlodipine 5 mg daily  #Acute hypoxemic respiratory failure status post tracheostomy still  requiring mechanical ventilation #Pulmonary edema secondary to HFpEF exacerbation #Severe mitral regurgitation #Aortic valve insufficiency #Multifocal pneumonia   PLAN - Usual post trach bundle - PMV when able - Unclear why placed back on vent, will try to touch base with RT   Acute metabolic encephalopathy 2/2 critical illness No current etiology determined.  CT and MRI of head negative for possible cause.  No known liver disease and ammonia normal during admission.  Added home benzodiazepine and started high-dose thiamine.  Continues to make progressive improvements day by day. - Encourage day/night cycles, family visits - Increase at bedtime gabapentin and klonipin   # Upper extremity DVT: -DOAC   #Severe protein calorie malnutrition  -Continue tube feeds.  PEG tube 10/24   #BPH Great urine output.  No concerns. -Continue on external urinary catheter   #Hyperbilirubinemia -Resolved   #Hypernatremia: Stable between 146-150. -continue to monitor while we diuresis  DISPO LTACH referral still being reviewed by insurance. Remains in ICU while needing vent intermittently. Appreciate [facility] taking over primary, will work on getting him on continuous TC Stable for discharge to [facility] whenever  Best Practice (right click and "Reselect all SmartList Selections" daily)   Diet/type: tubefeeds DVT prophylaxis: systemic dose LMWH GI prophylaxis: PPI Lines: Central line and yes and it is still needed Foley:  N/A Code Status:  full code Last date of multidisciplinary goals of care discussion [06/21/2023]   Lorin Glass, MD [facility] 06/26/2023 7:26 AM   For contact information, see Amion. If no response to pager, please call PCCM consult pager. After hours, 7PM- 7AM, please call Elink.

## 2023-06-26 NOTE — Evaluation (Signed)
SLP Cancellation Note  Patient Details Name: Joshua Eaton MRN: [id number] DOB: 25-Sep-1955   Cancelled treatment:       Reason Eval/Treat Not Completed: Other (comment);Fatigue/lethargy limiting ability to participate (pt not adequately alert for pmsv trial; max stimulation - tactile, verbal, wet wash cloth not effective to awaken him; he had been awake in am with RN after he was removed from vent; will continue efforts)   Chales Abrahams 06/26/2023, 8:53 AM

## 2023-06-26 NOTE — Progress Notes (Signed)
Pt placed back on ventilator at previous settings of PRVC 630/15/5+/40% due to increased WOB.  Pt appears comfortable and vitals are stable. RT will monitor as needed. RN at bedside.

## 2023-06-27 DIAGNOSIS — J9601 Acute respiratory failure with hypoxia: Secondary | ICD-10-CM | POA: Diagnosis not present

## 2023-06-27 LAB — CBC WITH DIFFERENTIAL/PLATELET
Abs Immature Granulocytes: 0.08 10*3/uL — ABNORMAL HIGH (ref 0.00–0.07)
Basophils Absolute: 0 10*3/uL (ref 0.0–0.1)
Basophils Relative: 0 %
Eosinophils Absolute: 0.1 10*3/uL (ref 0.0–0.5)
Eosinophils Relative: 1 %
HCT: 34.1 % — ABNORMAL LOW (ref 39.0–52.0)
Hemoglobin: 10.6 g/dL — ABNORMAL LOW (ref 13.0–17.0)
Immature Granulocytes: 1 %
Lymphocytes Relative: 24 %
Lymphs Abs: 2.4 10*3/uL (ref 0.7–4.0)
MCH: 31.9 pg (ref 26.0–34.0)
MCHC: 31.1 g/dL (ref 30.0–36.0)
MCV: 102.7 fL — ABNORMAL HIGH (ref 80.0–100.0)
Monocytes Absolute: 0.8 10*3/uL (ref 0.1–1.0)
Monocytes Relative: 8 %
Neutro Abs: 6.7 10*3/uL (ref 1.7–7.7)
Neutrophils Relative %: 66 %
Platelets: 147 10*3/uL — ABNORMAL LOW (ref 150–400)
RBC: 3.32 MIL/uL — ABNORMAL LOW (ref 4.22–5.81)
RDW: 17.2 % — ABNORMAL HIGH (ref 11.5–15.5)
WBC: 10.1 10*3/uL (ref 4.0–10.5)
nRBC: 0 % (ref 0.0–0.2)

## 2023-06-27 LAB — COMPREHENSIVE METABOLIC PANEL
ALT: 30 U/L (ref 0–44)
AST: 48 U/L — ABNORMAL HIGH (ref 15–41)
Albumin: 2.6 g/dL — ABNORMAL LOW (ref 3.5–5.0)
Alkaline Phosphatase: 62 U/L (ref 38–126)
Anion gap: 9 (ref 5–15)
BUN: 69 mg/dL — ABNORMAL HIGH (ref 8–23)
CO2: 30 mmol/L (ref 22–32)
Calcium: 9.6 mg/dL (ref 8.9–10.3)
Chloride: 110 mmol/L (ref 98–111)
Creatinine, Ser: 1.01 mg/dL (ref 0.61–1.24)
GFR, Estimated: >60 mL/min (ref >60)
Glucose, Bld: 150 mg/dL — ABNORMAL HIGH (ref 70–99)
Potassium: 3.4 mmol/L — ABNORMAL LOW (ref 3.5–5.1)
Sodium: 149 mmol/L — ABNORMAL HIGH (ref 135–145)
Total Bilirubin: 0.7 mg/dL (ref 0.3–1.2)
Total Protein: 6.4 g/dL — ABNORMAL LOW (ref 6.5–8.1)

## 2023-06-27 LAB — GLUCOSE, CAPILLARY
Glucose-Capillary: 172 mg/dL — ABNORMAL HIGH (ref 70–99)
Glucose-Capillary: 122 mg/dL — ABNORMAL HIGH (ref 70–99)
Glucose-Capillary: 147 mg/dL — ABNORMAL HIGH (ref 70–99)
Glucose-Capillary: 124 mg/dL — ABNORMAL HIGH (ref 70–99)
Glucose-Capillary: 130 mg/dL — ABNORMAL HIGH (ref 70–99)

## 2023-06-27 LAB — MAGNESIUM: Magnesium: 2.5 mg/dL — ABNORMAL HIGH (ref 1.7–2.4)

## 2023-06-27 MED ORDER — METOPROLOL SUCCINATE ER 50 MG PO TB24: 25.0000 mg | ORAL_TABLET | Freq: Every day | ORAL | Status: DC

## 2023-06-27 MED ORDER — OXYCODONE HCL 5 MG PO TABS
2.5000 mg | ORAL_TABLET | Freq: Once | ORAL | Status: AC
  Administered 2023-06-27: 2.5 mg
  Filled 2023-06-27: qty 1

## 2023-06-27 MED ORDER — POTASSIUM CHLORIDE 20 MEQ PO PACK
40.0000 meq | PACK | Freq: Once | ORAL | Status: AC
  Administered 2023-06-27: 40 meq
  Filled 2023-06-27: qty 2

## 2023-06-27 MED ORDER — LOPERAMIDE HCL 1 MG/7.5ML PO SUSP
2.0000 mg | Freq: Three times a day (TID) | ORAL | Status: DC
  Administered 2023-06-27 – 2023-06-28 (×4): 2 mg
  Filled 2023-06-27 (×6): qty 15

## 2023-06-27 MED ORDER — METOPROLOL TARTRATE 12.5 MG HALF TABLET: 12.5000 mg | ORAL_TABLET | Freq: Once | ORAL | Status: DC

## 2023-06-27 MED ORDER — QUETIAPINE FUMARATE 25 MG PO TABS
25.0000 mg | ORAL_TABLET | Freq: Two times a day (BID) | ORAL | Status: DC
  Administered 2023-06-27 – 2023-06-28 (×2): 25 mg
  Filled 2023-06-27 (×2): qty 1

## 2023-06-27 MED ORDER — METOPROLOL TARTRATE 12.5 MG HALF TABLET
12.5000 mg | ORAL_TABLET | Freq: Two times a day (BID) | ORAL | Status: DC
  Administered 2023-06-28: 12.5 mg
  Filled 2023-06-27: qty 1

## 2023-06-27 MED ORDER — OXYCODONE HCL 5 MG PO TABS: 2.5000 mg | ORAL_TABLET | Freq: Once | ORAL | Status: DC

## 2023-06-27 MED ORDER — METOPROLOL TARTRATE 12.5 MG HALF TABLET
12.5000 mg | ORAL_TABLET | Freq: Once | ORAL | Status: AC
  Administered 2023-06-27: 12.5 mg
  Filled 2023-06-27: qty 1

## 2023-06-27 NOTE — Progress Notes (Signed)
HOSPITALIST ROUNDING NOTE Joshua Eaton [id number]  DOB: [birth year]  DOA: 06/03/2023  PCP: Kaleen Mask, MD  06/27/2023,10:36 AM   LOS: 24 days      Code Status: Remains full code From: Home  current Dispo:?  LTAC     67 year old white male Prior OCD with hospitalization back in 2017 continues to follow with behavioral health Colon cancer with right hemicolectomy in 2007,?  COPD wears CPAP at home Mitral valve repair? Admit 05/20/2023 SOB DOE-admitted to the ICU intubated 9/20 Developed ARDS picture at OSH [facility]) and given high vent requirements transferred to [facility] 9/19 echo = severe MR moderate AI 10/6 tracheostomy 10/9 TEE performed-cardiology consulted severe MR based on echo versus mitral valve perforation with very limited options per cardiology 10/11 CT surgery consulted felt patient very poor candidate for any type of valvular repair 10/12 goals of care performed C. 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 Leafy Ro CVTS---- may eventually if recovers at [facility] be a candidate? A-fib RVR -tapering amiodarone 200 twice daily to 200 daily on 10/31, add metoprolol XL and increase it to 25 Eliquis 5 twice daily per tube Diuresis has been held his albumin is low--weight is fluctuating between the 81 and 84 range-weight prior to admission is unknown  Hypoxic respiratory failure requiring occasional mechanical ventilation --- seems to be put back on the vent most evenings multifocal pneumonia-completed 7 days of Zosyn ending 10/16 Last CXR 10/24 patchy parenchymal lung densities--rpt  1 vw cxr tomorrow No further workup at this time monitor secretions aggressively-discussed overall prognosis with family with high risk of recurrent secretions causing issues  Hypernatremia---Azotemia Increase free water to 200 every 6 --sodium slightly better Received Diamox 10/25-no further need  VTE with upper extremity DVT PICC line to be removed later today Mild thrombocytopenia with macrocytic anemia Continue apixaban 5 twice daily short-term likely 1 to 3 months  Some ICU related delirium? Metabolic encephalopathy of critical illness Intermittent but likely related also to the sedatives and meds as above Minimize fentanyl, discontinue daytime Klonopin, discontinue restraints-only use low-dose Klonopin 1 mg per tube at bedtime Discontinue gabapentin completely--- wean Seroquel from 100-->50 twice daily and discontinue eventually  Severe protein energy malnutrition-PEG tube placed 10/24 cont feeds Feed induced diarrhea-resume Imodium 2 mg Q8-watch for persisting constipation  Anemia of critical illness-macrocytic anemia Watch platelets they are low normal-but are improved from admission baseline hemoglobin in 2017 was 16 range--care with anticoagulation  Decubitus Pressure injury stage III and anus MASD on buttocks pressure injury on sacrum Turn as best possible manuka honey further dressings etc.  Discussed in detail with son and wife at bedside-mentioned that his prognosis is relatively poor Mentioned that it would be day by day assessments of his mentation   DVT prophylaxis: Eliquis twice daily  Status is: Inpatient Remains inpatient appropriate because:   Requires LTAC not ready to discharge until we have a bed      Subjective:  Had dysarthric speech with PMV but much more alert coherent moving legs Was able to speak with wife He is having several episodes of diarrhea so we resumed Imodium he also  had a little bit of SVT and we are adjusting his  metoprolol Otherwise looks stable overall.  Better   Objective + exam Vitals:   06/27/23 0700 06/27/23 0730 06/27/23 0800 06/27/23 0900  BP: (!) 137/56  102/88 139/60  Pulse: 84  83 89  Resp: (!) 23  18 (!) 23  Temp:  97.7 F (36.5 C)    TempSrc:  Axillary    SpO2: 95%  95% 92%  Weight:      Height:       Filed Weights   06/25/23 0500 06/26/23 0500 06/27/23 0457  Weight: 84.9 kg 84.9 kg 81.8 kg    Examination:  EOMI NCAT S1-S2 no murmur sinus tach with SVT PEG tube in place PICC line in right upper extremity in place Ankles and heels seem clean    Data Reviewed: reviewed   CBC    Component Value Date/Time   WBC 10.1 06/27/2023 0747   RBC 3.32 (L) 06/27/2023 0747   HGB 10.6 (L) 06/27/2023 0747   HCT 34.1 (L) 06/27/2023 0747   PLT 147 (L) 06/27/2023 0747   MCV 102.7 (H) 06/27/2023 0747   MCH 31.9 06/27/2023 0747   MCHC 31.1 06/27/2023 0747   RDW 17.2 (H) 06/27/2023 0747   LYMPHSABS 2.4 06/27/2023 0747   MONOABS 0.8 06/27/2023 0747   EOSABS 0.1 06/27/2023 0747   BASOSABS 0.0 06/27/2023 0747      Latest Ref Rng & Units 06/27/2023    7:47 AM 06/26/2023    6:17 PM 06/26/2023    7:36 AM  CMP  Glucose 70 - 99 mg/dL 409  811  914   BUN 8 - 23 mg/dL 69  77  65   Creatinine 0.61 - 1.24 mg/dL 7.82  9.56  2.13   Sodium 135 - 145 mmol/L 149  151  152   Potassium 3.5 - 5.1 mmol/L 3.4  3.5  4.0   Chloride 98 - 111 mmol/L 110  112  114   CO2 22 - 32 mmol/L 30  29  29    Calcium 8.9 - 10.3 mg/dL 9.6  9.8  9.7   Total Protein 6.5 - 8.1 g/dL 6.4  6.3    Total Bilirubin 0.3 - 1.2 mg/dL 0.7  0.9    Alkaline Phos 38 - 126 U/L 62  58    AST 15 - 41 U/L 48  36    ALT 0 - 44 U/L 30  25       Scheduled Meds:  acetaminophen  1,000 mg Per Tube Q8H   amiodarone  200 mg Per Tube BID   Followed by   Melene Muller ON 07/01/2023] amiodarone  200 mg Per Tube Daily   apixaban  5 mg Per Tube BID   Chlorhexidine Gluconate Cloth  6 each Topical Daily   clonazePAM  1 mg Per Tube QHS    feeding supplement (PROSource TF20)  60 mL Per Tube Daily   fiber supplement (BANATROL TF)  60 mL Per Tube TID   folic acid  1 mg Per Tube Daily   free water  200 mL Per Tube Q6H   Gerhardt's butt cream   Topical BID   insulin aspart  2 Units Subcutaneous Q4H   insulin aspart  2-6 Units Subcutaneous Q4H   leptospermum manuka honey  1 Application Topical Daily   metoprolol succinate  12.5 mg Oral Daily   multivitamin with minerals  1 tablet Per Tube Daily   neomycin-bacitracin-polymyxin  1 Application Topical Daily  nutrition supplement (JUVEN)  1 packet Per Tube BID BM   mouth rinse  15 mL Mouth Rinse 4 times per day   pantoprazole (PROTONIX) IV  40 mg Intravenous Q24H   QUEtiapine  50 mg Per Tube BID   sodium chloride flush  10-40 mL Intracatheter Q12H   thiamine  100 mg Per Tube Daily   Continuous Infusions:  feeding supplement (JEVITY 1.5 CAL/FIBER) 65 mL/hr at 06/27/23 0800    Time  68  Rhetta Mura, MD  [facility]

## 2023-06-27 NOTE — Progress Notes (Signed)
06/27/2023 Will see again tomorrow, continue indefinite TC.    If fails TC, RT please document in progress note reason why failed.  Myrla Halsted MD PCCM

## 2023-06-27 NOTE — Evaluation (Signed)
Passy-Muir Speaking Valve - Evaluation Patient Details  Name: [facility] MRN: [id number] Date of Birth: [birth year]  Today's Date: 06/27/2023 Time: 1225-1300 SLP Time Calculation (min) (ACUTE ONLY): 35 min  Past Medical History:  Past Medical History:  Diagnosis Date   Anxiety    Cancer (HCC)    Depression    Hypertension    OCD (obsessive compulsive disorder)    Past Surgical History:  Past Surgical History:  Procedure Laterality Date   CHOLECYSTECTOMY     COLON SURGERY     cancerous polyp removed   HERNIA REPAIR     IR GASTROSTOMY TUBE MOD SED  06/24/2023   HPI:  This is a 67 year old male with a past medical history of colon cancer status post right hemicolectomy 2007, COPD, mitral valve Regurgitation who presented to [facility] on 05/20/2023 for shortness of breath. Prior to presenting, patient was feeling unwell for about 6 days with cough, shortness of breath. He had an initial CXR showing bilateral airspace opacities and was eventually placed on BiPAP and admitted to ICU. On 9/20 he was intubated due to ongoing respiratory failure and antibiotics were widened for multifocal pneumonia. He had echo performed showing EF 60-65%, severe dilated LA, severe MR. Throughout his course at [facility] had ongoing ARDS picture, developed worsening AKI. Eventually, [facility] team was consulted for transfer here due to ongoing high vent requirements, unable to wean. S/p tracheostomy 10/6. Had cuff leak 10/18 and reintubated for trach revision.    Assessment / Plan / Recommendation  Clinical Impression  Initial PMV evaluation complete. Patient lying in bed, cuff deflated, restless, pulling at sheet, with low intensity leak speech noted. Valve placed without incidence and patient able to tolerate for 25 minutes with no change in vital signs (all remained WFL), no evidence of increased distress or air trapping upon brief removal, and clear vocal quality during attempts which were limited due to  patient cooperation vs AMS. Speech is dysarthric impacting overall intelligibility. Oriented to self and place but did not respond when asked who family members were in room. Safe to use PMV with full staff supervision. SLP will f/u next date for tolerance and potential to use with family members. SLP Visit Diagnosis: Aphonia (R49.1)    SLP Assessment  Patient needs continued Speech Lanaguage Pathology Services    Recommendations for follow up therapy are one component of a multi-disciplinary discharge planning process, led by the attending physician.  Recommendations may be updated based on patient status, additional functional criteria and insurance authorization.  Follow Up Recommendations  SLP at [facility]-term acute care [facility]    Assistance Recommended at Discharge Frequent or constant Supervision/Assistance  Functional Status Assessment Patient has had a recent decline in their functional status and demonstrates the ability to make significant improvements in function in a reasonable and predictable amount of time.  Frequency and Duration min 2x/week  2 weeks    PMSV Trial PMSV was placed for: 25 minutes Able to redirect subglottic air through upper airway: Yes Able to Attain Phonation: Yes Voice Quality: Normal Able to Expectorate Secretions: Yes Level of Secretion Expectoration with PMSV: Not observed Breath Support for Phonation: Adequate Intelligibility: Intelligibility reduced Word: 0-24% accurate Phrase: 0-24% accurate Sentence: 0-24% accurate Conversation: 0-24% accurate Respirations During Trial: 20 SpO2 During Trial: 94 % Pulse During Trial: 94 Behavior: Alert (restless, intermittently agitated appearing)   Tracheostomy Tube       Vent Dependency  Vent Dependent: No FiO2 (%): (S) 28 %  Cuff Deflation Trial Tolerated Cuff Deflation: Yes Length of Time for Cuff Deflation Trial: baseline Behavior: Alert;Restless;Quiet  Istvan Behar MA, CCC-SLP        Immanuel Fedak Meryl 06/27/2023, 1:25 PM

## 2023-06-27 NOTE — Plan of Care (Signed)
  Problem: Clinical Measurements: Goal: Will remain free from infection Outcome: Progressing Goal: Diagnostic test results will improve Outcome: Progressing   

## 2023-06-28 ENCOUNTER — Inpatient Hospital Stay
Admission: EM | Admit: 2023-06-28 | Discharge: 2023-07-01 | Disposition: A | Discharge status: Other Acute Inpatient Hospital | Payer: Medicare Other | Source: Other Acute Inpatient Hospital | Attending: Internal Medicine | Admitting: Internal Medicine

## 2023-06-28 ENCOUNTER — Other Ambulatory Visit (HOSPITAL_COMMUNITY): Payer: Medicare Other

## 2023-06-28 ENCOUNTER — Inpatient Hospital Stay (HOSPITAL_COMMUNITY): Payer: Medicare Other

## 2023-06-28 DIAGNOSIS — J9601 Acute respiratory failure with hypoxia: Secondary | ICD-10-CM | POA: Diagnosis not present

## 2023-06-28 DIAGNOSIS — Z930 Tracheostomy status: Secondary | ICD-10-CM | POA: Diagnosis not present

## 2023-06-28 LAB — COMPREHENSIVE METABOLIC PANEL
ALT: 44 U/L (ref 0–44)
AST: 65 U/L — ABNORMAL HIGH (ref 15–41)
Albumin: 2.6 g/dL — ABNORMAL LOW (ref 3.5–5.0)
Alkaline Phosphatase: 68 U/L (ref 38–126)
Anion gap: 6 (ref 5–15)
BUN: 75 mg/dL — ABNORMAL HIGH (ref 8–23)
CO2: 30 mmol/L (ref 22–32)
Calcium: 10.2 mg/dL (ref 8.9–10.3)
Chloride: 111 mmol/L (ref 98–111)
Creatinine, Ser: 0.98 mg/dL (ref 0.61–1.24)
GFR, Estimated: >60 mL/min (ref >60)
Glucose, Bld: 180 mg/dL — ABNORMAL HIGH (ref 70–99)
Potassium: 3.5 mmol/L (ref 3.5–5.1)
Sodium: 147 mmol/L — ABNORMAL HIGH (ref 135–145)
Total Bilirubin: 0.9 mg/dL (ref 0.3–1.2)
Total Protein: 6.4 g/dL — ABNORMAL LOW (ref 6.5–8.1)

## 2023-06-28 LAB — GLUCOSE, CAPILLARY
Glucose-Capillary: 105 mg/dL — ABNORMAL HIGH (ref 70–99)
Glucose-Capillary: 132 mg/dL — ABNORMAL HIGH (ref 70–99)
Glucose-Capillary: 147 mg/dL — ABNORMAL HIGH (ref 70–99)
Glucose-Capillary: 151 mg/dL — ABNORMAL HIGH (ref 70–99)
Glucose-Capillary: 158 mg/dL — ABNORMAL HIGH (ref 70–99)

## 2023-06-28 LAB — MAGNESIUM: Magnesium: 2.5 mg/dL — ABNORMAL HIGH (ref 1.7–2.4)

## 2023-06-28 MED ORDER — OXYCODONE HCL 5 MG PO TABS
2.5000 mg | ORAL_TABLET | Freq: Once | ORAL | Status: AC
  Administered 2023-06-28: 2.5 mg
  Filled 2023-06-28: qty 1

## 2023-06-28 MED ORDER — DIATRIZOATE MEGLUMINE & SODIUM 66-10 % PO SOLN
30.0000 mL | Freq: Once | ORAL | Status: AC
  Administered 2023-06-28: 30 mL

## 2023-06-28 MED ORDER — MEDIHONEY WOUND/BURN DRESSING EX PSTE: 1.0000 | PASTE | Freq: Every day | CUTANEOUS | Status: DC

## 2023-06-28 MED ORDER — LORAZEPAM 0.5 MG PO TABS: 0.5000 mg | ORAL_TABLET | Freq: Every morning | ORAL | 0 refills | Status: DC

## 2023-06-28 MED ORDER — OMEPRAZOLE MAGNESIUM 20 MG PO TBEC: 20.0000 mg | DELAYED_RELEASE_TABLET | Freq: Every day | ORAL | Status: DC

## 2023-06-28 MED ORDER — METOPROLOL TARTRATE 25 MG PO TABS: 12.5000 mg | ORAL_TABLET | Freq: Two times a day (BID) | ORAL | Status: DC

## 2023-06-28 MED ORDER — QUETIAPINE FUMARATE 25 MG PO TABS: 25.0000 mg | ORAL_TABLET | Freq: Two times a day (BID) | ORAL | 0 refills | Status: DC

## 2023-06-28 MED ORDER — FREE WATER: 200.0000 mL | Freq: Four times a day (QID) | Status: DC

## 2023-06-28 MED ORDER — APIXABAN 5 MG PO TABS: 5.0000 mg | ORAL_TABLET | Freq: Two times a day (BID) | ORAL | Status: DC

## 2023-06-28 MED ORDER — AMIODARONE HCL 200 MG PO TABS: ORAL_TABLET | ORAL | Status: DC

## 2023-06-28 MED ORDER — LOPERAMIDE HCL 1 MG/7.5ML PO SUSP: 2.0000 mg | Freq: Three times a day (TID) | ORAL | Status: DC

## 2023-06-28 MED ORDER — GERHARDT'S BUTT CREAM: 1.0000 | TOPICAL_CREAM | Freq: Two times a day (BID) | CUTANEOUS | Status: DC

## 2023-06-28 MED ORDER — OSMOLITE 1 CAL PO LIQD: 1000.0000 mL | Freq: Every day | ORAL | Status: DC

## 2023-06-28 NOTE — TOC Transition Note (Addendum)
Transition of Care [facility]) - CM/SW Discharge Note   Patient Details  Name: [facility] MRN: [id number] Date of Birth: October 07, 1955  [facility]) CM/SW Contact:  Epifanio Lesches, RN Phone Number: 06/28/2023, 3:53 PM   Clinical Narrative:    Per MD patient ready for [facility]. RN, patient,  and patient's wife, and aware of DC.  RN to call report prior to discharge [phone]). Rm # [id number]. rECEIVING md , Luna Kitchens. RNCM will sign off for now as intervention is no longer needed.   Final next level of care: Long Term Acute Care (LTAC) Barriers to Discharge: No Barriers Identified   Patient Goals and CMS Choice   Choice offered to / list presented to : Spouse  Discharge Placement                         Discharge Plan and Services Additional resources added to the After Visit Summary for   In-house Referral: Clinical Social Work   Post Acute Care Choice: Long Term Acute Care (LTAC)                               Social Determinants of Health (SDOH) Interventions SDOH Screenings   Tobacco Use: Low Risk  (01/20/2023)     Readmission Risk Interventions     No data to display            --

## 2023-06-28 NOTE — Progress Notes (Signed)
Nutrition Follow-up  DOCUMENTATION CODES:  Not applicable  INTERVENTION:  Continue TF. Adjust to bolus feeds: Jevity 1.5 at 65 ml/h (1560 ml per day) Prosource TF20 60 ml 1x/d free water q6h  Provides 2420 kcal, 120 gm protein, 1186 ml free water daily ( free water TF+flush) 1 packet Juven BID, each packet provides 95 calories, 2.5 grams of protein (collagen), and 9.8 grams of carbohydrate (3 grams sugar); also contains 7 grams of L-arginine and L-glutamine, 300 mg vitamin C, 15 mg vitamin E, 1.2 mcg vitamin B-12, 9.5 mg zinc, 200 mg calcium, and 1.5 g  Calcium Beta-hydroxy-Beta-methylbutyrate to support wound healing Banatrol TID to aid in management of loose stools Recommend Probiotic BID via PEG to promote gut health - messaged MD  NUTRITION DIAGNOSIS:  Inadequate oral intake related to acute illness as evidenced by NPO status. - remains applicable  GOAL:  Patient will meet greater than or equal to 90% of their needs - progressing, being met with TF at goal  MONITOR:  Vent status, Labs, Weight trends, TF tolerance  REASON FOR ASSESSMENT:  Consult Enteral/tube feeding initiation and management  ASSESSMENT:  Pt admitted with PNA and respiratory failure. PMH significant for colon cancer s/p R hemicolectomy (2007), COPD, MVR.  9/20: intubated at [facility] 10/3: transferred to [facility] d/t high vent requirements, unable to wean, and concern for need for MVR and R heart cath 10/6 - bedside trach placed, bronchoscopy 10/9 - TEE, small bore tube pulled during procedure, cortrak placed 10/18 - bronchoscopy, trach revision 10/24 - PEG placed in IR  Pt working with SLP at the time of assessment. Pt had PEG placed 10/24 and per RN is working well. RN reports that he continues to have a lot of stool output. Pt was changed to a fiber containing formula last week and additional fiber added on top. Messaged MD about starting a probiotic, no response yet.   Discussed in  rounds, pt stable to transfer out of ICU and to [facility]. Currently awaiting bed availability at [facility]   Temp (24hrs), Avg:97.8 F (36.6 C), Min:97.1 F (36.2 C), Max:98.5 F (36.9 C)  Admit weight: 101.9 kg Current weight: 81.1 kg Weight loss noted this admission, pt aggressively diuresed and -11L which is likely the driving force of decrease in weight   Intake/Output Summary (Last 24 hours) at 06/28/2023 0905 Last data filed at 06/28/2023 0600 Gross per 24 hour  Intake 1365 ml  Output 1350 ml  Net 15 ml  Net IO Since Admission: -11,455.12 mL [06/28/23 0905]  Nutritionally Relevant Medications: Scheduled Meds:  feeding supplement (PROSource TF20)  60 mL Per Tube Daily   fiber supplement (BANATROL TF)  60 mL Per Tube TID   folic acid  1 mg Per Tube Daily   free water  200 mL Per Tube Q6H   insulin aspart  2 Units Subcutaneous Q4H   insulin aspart  2-6 Units Subcutaneous Q4H   multivitamin with minerals  1 tablet Per Tube Daily   nutrition supplement (JUVEN)  1 packet Per Tube BID BM   pantoprazole (PROTONIX) IV  40 mg Intravenous Q24H   thiamine  100 mg Per Tube Daily   Continuous Infusions:  feeding supplement (JEVITY 1.5 CAL/FIBER) 65 mL/hr at 06/28/23 0600   Labs Reviewed: Na 147 BUN 75 CBG ranges from 120-164 mg/dL over the last 24 hours  NUTRITION - FOCUSED PHYSICAL EXAM: Flowsheet Row Most Recent Value  Orbital Region Unable to assess  Upper Arm Region Mild depletion  Thoracic and Lumbar Region No depletion  Buccal Region Unable to assess  Temple Region Severe depletion  Clavicle Bone Region Severe depletion  Clavicle and Acromion Bone Region Moderate depletion  Scapular Bone Region Unable to assess  Dorsal Hand Unable to assess  [mod edema]  Patellar Region Unable to assess  [RLE edema]  Anterior Thigh Region Severe depletion  Posterior Calf Region Severe depletion  Edema (RD Assessment) Moderate  [moderate BLE]  Hair Reviewed  Eyes Reviewed  Mouth Unable  to assess  Skin Reviewed  Nails Reviewed   Diet Order:   Diet Order     None       EDUCATION NEEDS:  No education needs have been identified at this time  Skin:  Skin Assessment: Reviewed RN Assessment Full thickness: - rectum (3 x 3 x 0.1 cm)  Stage 3: - left buttocks (0.5 cm x 1 cm)  Unstageable: - sacrum (3 x 2 cm)  Last BM:  10/28 - type 6  Height:  Ht Readings from Last 1 Encounters:  06/22/23 6' 0.99" (1.854 m)   Weight:  Wt Readings from Last 1 Encounters:  06/28/23 81.1 kg   Ideal Body Weight:  83.6 kg  BMI:  Body mass index is 23.59 kg/m.  Estimated Nutritional Needs:  Kcal:  2300-2600 kcal/d Protein:  110-130g/d Fluid:  2-2.3L/d    Greig Castilla, RD, LDN Clinical Dietitian RD pager # available in [facility]  After hours/weekend pager # available in [facility]

## 2023-06-28 NOTE — TOC Progression Note (Signed)
Transition of Care [facility]) - Progression Note    Patient Details  Name: Joshua [facility] MRN: [id number] Date of Birth: 1956-04-20  Transition of Care [facility]) CM/SW Contact  Lorri Frederick, LCSW Phone Number: 06/28/2023, 9:47 AM  Clinical Narrative:   Message from [facility]: they do have insurance auth, waiting on bed availability.      Expected Discharge Plan: Long Term Acute Care (LTAC) Barriers to Discharge: Other (must enter comment), Continued Medical Work up ([facility])  Expected Discharge Plan and Services In-house Referral: Clinical Social Work   Post Acute Care Choice: Long Term Acute Care (LTAC) Living arrangements for the past 2 months: Single Family Home                                       Social Determinants of Health (SDOH) Interventions SDOH Screenings   Tobacco Use: Low Risk  (01/20/2023)    Readmission Risk Interventions     No data to display

## 2023-06-28 NOTE — Progress Notes (Signed)
Speech Language Pathology Treatment: [name] Speaking valve  Patient Details Name: [facility] MRN: [id number] DOB: 1956/04/10 Today's Date: 06/28/2023 Time: 7829-5621 SLP Time Calculation (min) (ACUTE ONLY): 10 min  Assessment / Plan / Recommendation Clinical Impression  Pt seen for PMSV tolerance.  Sister [name] in room on arrival and son and wife arrived during session.  Cuff fully deflated on arrival.  RN reported good tolerance of valve this morning and overnight per report.  SLP placed valve on arrival.  Coughing observed. SLP provided oral care with removal of a large amount of thick dried secretions, red/tan in color.  Pt tolerated valve placement for 30 minutes during session without change in vitals or increased work of breathing.  Pt with strong vocal quality.  Some leak phonation noted as well.  Pt participated in PO trials during today's assessment with valve in place.  All 3 family members were trained in donning/doffing and demonstrated ability to place and remove valve.  Reviewed precautions for PMSV use verbally and they are in writing on sign in room.  Family recalled cuff deflation rules independently and to remove when tired or when left alone.  Encourage valve use with supervision to help with secretion management.   Pt may wear valve intermittently as tolerated with direct supervision with staff and trained family members.     HPI HPI: This is a 67 year old male with a past medical history of colon cancer status post right hemicolectomy 2007, COPD, mitral valve Regurgitation who presented to [facility] health on 05/20/2023 for shortness of breath. Prior to presenting, patient was feeling unwell for about 6 days with cough, shortness of breath. He had an initial CXR showing bilateral airspace opacities and was eventually placed on BiPAP and admitted to ICU. On 9/20 he was intubated due to ongoing respiratory failure and antibiotics were widened for multifocal pneumonia. He had echo  performed showing EF 60-65%, severe dilated LA, severe MR. Throughout his course at [facility] had ongoing ARDS picture, developed worsening AKI. Eventually, [facility] PCCM team was consulted for transfer here due to ongoing high vent requirements, unable to wean. S/p tracheostomy 10/6. Had cuff leak 10/18 and reintubated for trach revision.      SLP Plan  Continue with current plan of care;MBS      Recommendations for follow up therapy are one component of a multi-disciplinary discharge planning process, led by the attending physician.  Recommendations may be updated based on patient status, additional functional criteria and insurance authorization.    Recommendations  Diet recommendations: NPO Medication Administration: Via alternative means      Patient may use Passy-Muir Speech Valve: Intermittently with supervision;Caregiver trained to provide supervision PMSV Supervision: Full MD: Please consider changing trach tube to : Smaller size;Cuffless           Oral care QID;Oral care prior to ice chip/H20   Frequent or constant Supervision/Assistance Aphonia (R49.1);Dysphagia, oropharyngeal phase (R13.12)     Continue with current plan of care;MBS     Kerrie Pleasure, MA, CCC-SLP Acute Rehabilitation Services Office: [phone] 06/28/2023, 11:06 AM

## 2023-06-28 NOTE — Discharge Summary (Signed)
Physician Discharge Summary  Joshua Eaton QMV:[id number] DOB: September 28, 1955 DOA: 06/03/2023  PCP: Kaleen Mask, MD  Admit date: 06/03/2023 Discharge date: 06/28/2023  Time spent: 40  minutes  Recommendations for Outpatient Follow-up:  Trach downsizing decannulation and [facility] an [facility] Requires continuous feeds 65 cc/H in addition to various protein supplements as per skilled nursing facility/LTAC Recommend free water as per orders Recommend close monitoring for recurrent pneumonia given trach and thick secretions-would recommend chest PT twice daily in addition to HOB 45 degrees and continued efforts with SLP for MBS etc. etc. 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 other than for ice chips-recommend tube feeds until can be reassessed by SLP  Filed Weights   06/26/23 0500 06/27/23 0457 06/28/23 0500  Weight: 84.9 kg 81.8 kg 81.1 kg    History of present illness:  67 year old white male Prior OCD with hospitalization back in 2017 continues to follow with behavioral health Colon cancer  with right hemicolectomy in 2007,?  COPD wears CPAP at home Mitral valve repair? Admit 05/20/2023 SOB DOE-admitted to the ICU intubated 9/20 Developed ARDS picture at [facility]) and given high vent requirements transferred to ICU [facility] 9/19 echo = severe MR moderate AI 10/6 tracheostomy 10/9 TEE performed-cardiology consulted severe MR based on echo versus mitral valve perforation with very limited options per cardiology 10/11 CT surgery consulted felt patient very poor candidate for any type of valvular repair 10/12 goals of care performed C. difficile negative 10/16 upper extremity DVT noted on Dopplers placed on DOAC 10/18 reintubated trach revision 10/21 transferred back to vent 10/22 with new A-fib RVR 10/24 placed on trach collar improving mentation 10/25 on trach collar 10/27 PICC R side removed 10/28 more interactive looks fair using Passy-Muir  Hospital Course:   Severe MR moderate AI with valvular destruction mitral valve and perforation of anterior mitral valve leaflet A-fib RVR (new) Not a surgical candidate based on consult from Dr. Leafy Ro CVTS---- may eventually if recovers at [facility] be a candidate? A-fib RVR -tapering amiodarone 200 twice daily to 200 daily on 10/31, metoprolol now 25 bid-- Eliquis 5 twice daily per tube Diuresis has been held his albumin is low--weight is fluctuating between the 81 and 84 range-weight prior to admission is unknown   Hypoxic respiratory failure requiring occasional mechanical ventilation ---  multifocal pneumonia-completed 7 days of Zosyn ending 10/16 Last CXR 10/24 patchy parenchymal lung densities-- rpt 1 vw cxr suggestive Atelectasis >pna-----monitor secretions aggressively-hob 45 deg, chest physio --- hopefully this can be continued at the long-term acute care hospital and he can continue to get close monitoring respiratory state Plan was to eventually decannulate and changed to a cuffless trach--[facility]   Hypernatremia---Azotemia free water to 200 every 8 --sodium slightly better Received Diamox 10/25-no further need   VTE with upper extremity DVT--R side picc removed Mild thrombocytopenia with macrocytic anemia Continue apixaban 5 twice daily short-term likely 1 to 3 months   Some ICU related delirium? Metabolic encephalopathy of critical illness--Intermittent 2/2 sedatives and meds as above At home previously was on fentanyl BZD and other meds He will require intermittent Ativan in the mornings  --Discontinue gabapentin completely--- wean Seroquel from 100-->25 twice daily and I would de-escalate this going forward completely when able Much improved--- would not escalate meds again unless really needed   Severe protein energy malnutrition-PEG tube placed 10/24 cont feeds Feed induced diarrhea-resume Imodium 2 mg Q8-watch for persisting constipation   Anemia of critical illness-macrocytic anemia Plt improved from admission baseline hemoglobin in 2017 was 16 range--care with anticoagulation Watch for bleeding   Decubitus Pressure injury stage III and anus MASD on buttocks pressure injury on sacrum Turn as best possible manuka honey further dressings etc. Wounds reviewed and has natal cleft skin denudation-he has stage III decubitus but it is small and I believe this will heal well if he improves function at long-term acute care   Patient has high risk for decompensation and requires high-level care and monitoring at [facility] I have discussed on several days prognosis and trajectory of care to family-they want everything done at this time  Discharge Exam: Vitals:   06/28/23 1112 06/28/23 1200  BP:  (!) 130/49  Pulse:  75  Resp:  16  Temp: 97.9 F (36.6 C)   SpO2:  96%    Subj on day of d/c   Grunts in response to stimulation Following commands intermittently only  General Exam on discharge  Awake coherent trach in place seems clean S1-S2  seems to be predominantly sinus Abdomen is soft with binder-PEG tube in place infusing feeds Chest exam anteriorly is clear Skin exam as above Intermittently moving both upper and lower extremities  Discharge Instructions   Discharge Instructions     Diet - low sodium heart healthy   Complete by: As directed    Discharge wound care:   Complete by: As directed    Wound Care Orders (From admission, onward)      Start     Ordered   06/25/23 1000    Wound care  Daily      Comments: Clean sacral and L buttock wound with Vashe wound cleanser, apply Medihoney to wound beds daily, cover with dry gauze and sacral foam. Continue to place Gerhardt's to surrounding skin buttocks and upper thighs.  06/25/23 0821   06/25/23 0822    Wound care  2 times daily      Comments: Clean rectal wound with Vashe wound cleanser, place a piece of Vashe moistened gauze to wound bed 2 times daily, cover with dry gauze and secure as able.  Attempt to place flexiseal away from area  06/25/23 0821   Increase activity slowly   Complete by: As directed       Allergies as of 06/28/2023       Reactions   Codeine    "sends me into orbit"        Medication List     STOP taking these medications    benzonatate 200 MG capsule Commonly known as: TESSALON   chlorhexidine 0.12 % solution Commonly known as: PERIDEX   doxycycline 100 MG capsule Commonly known as:  VIBRAMYCIN   fentaNYL citrate (PF) 10 mcg/mL in sodium chloride 0.9 % 200 mL   gabapentin 100 MG capsule Commonly known as: NEURONTIN   guaiFENesin-Codeine 200-20 MG/10ML Soln   levofloxacin 750 MG/150ML Soln Commonly known as: LEVAQUIN   loratadine 10 MG tablet Commonly known as: CLARITIN   meloxicam 7.5 MG tablet Commonly known as: MOBIC   methocarbamol 500 MG tablet Commonly known as: ROBAXIN   methylPREDNISolone acetate 40 MG/ML injection Commonly known as: DEPO-MEDROL   METOCLOPRAMIDE HCL IJ   OLANZapine 2.5 MG  tablet Commonly known as: ZYPREXA   ondansetron 4 MG/2ML Soln injection Commonly known as: ZOFRAN   pantoprazole 40 mg in sodium chloride 0.9 % 100 mL   polyethylene glycol 17 g packet Commonly known as: MIRALAX / GLYCOLAX   PRECEDEX IV   predniSONE 20 MG tablet Commonly known as: DELTASONE   promethazine 25 MG/ML injection Commonly known as: PHENERGAN   promethazine-dextromethorphan 6.25-15 MG/5ML syrup Commonly known as: PROMETHAZINE-DM   propofol 1000 MG/100ML Emul injection Commonly known as: DIPRIVAN   propranolol 20 MG tablet Commonly known as: INDERAL   sennosides-docusate sodium 8.6-50 MG tablet Commonly known as: SENOKOT-S   sertraline 100 MG tablet Commonly known as: ZOLOFT   zolpidem 5 MG tablet Commonly known as: AMBIEN       TAKE these medications    acetaminophen 325 MG tablet Commonly known as: TYLENOL Take 650 mg by mouth every 6 (six) hours as needed for moderate pain.   amiodarone 200 MG tablet Commonly known as: PACERONE Place 1 tablet (200 mg total) into feeding tube 2 (two) times daily for 4 days, THEN 1 tablet (200 mg total) daily. Start taking on: June 28, 2023   apixaban 5 MG Tabs tablet Commonly known as: ELIQUIS Place 1 tablet (5 mg total) into feeding tube 2 (two) times daily.   aspirin EC 81 MG tablet Take 81 mg by mouth daily. Swallow whole.   budesonide 0.5 MG/2ML nebulizer solution Commonly known as: PULMICORT Take 0.5 mg by nebulization 2 (two) times daily.   feeding supplement Liqd Take 1,000 mLs by mouth daily. 88mL/hr   free water Soln Place 200 mLs into feeding tube every 6 (six) hours.   Gerhardt's butt cream Crea Apply 1 Application topically 2 (two) times daily.   ipratropium-albuterol 0.5-2.5 (3) MG/3ML Soln Commonly known as: DUONEB Take 3 mLs by nebulization every 6 (six) hours.   leptospermum manuka honey Pste paste Apply 1 Application topically daily. Start taking on: June 29, 2023    loperamide HCl 1 MG/7.5ML suspension Commonly known as: IMODIUM Place 15 mLs (2 mg total) into feeding tube every 8 (eight) hours.   LORazepam 0.5 MG tablet Commonly known as: ATIVAN Take 1 tablet (0.5 mg total) by mouth in the morning. What changed: Another medication with the same name was removed. Continue taking this medication, and follow the directions you see here.   metoprolol tartrate 25 MG tablet Commonly known as: LOPRESSOR Place 0.5 tablets (12.5 mg total) into feeding tube 2 (two) times daily.   MULTIVITAL PO Take 5 mLs by mouth in the morning and at bedtime.   omeprazole 20 MG tablet Commonly known as: PriLOSEC OTC Take 1 tablet (20 mg total) by mouth daily.   QUEtiapine 25 MG tablet Commonly known as: SEROQUEL Place 1 tablet (25 mg total) into feeding tube 2 (two) times daily.               Discharge Care Instructions  (From admission,  onward)           Start     Ordered   06/28/23 0000  Discharge wound care:       Comments: Wound Care Orders (From admission, onward)      Start     Ordered   06/25/23 1000    Wound care  Daily      Comments: Clean sacral and L buttock wound with Vashe wound cleanser, apply Medihoney to wound beds daily, cover with dry gauze and sacral foam. Continue to place Gerhardt's to surrounding skin buttocks and upper thighs.  06/25/23 0821   06/25/23 0822    Wound care  2 times daily      Comments: Clean rectal wound with Vashe wound cleanser, place a piece of Vashe moistened gauze to wound bed 2 times daily, cover with dry gauze and secure as able.  Attempt to place flexiseal away from area  06/25/23 0821   06/28/23 1307           Allergies  Allergen Reactions   Codeine     "sends me into orbit"      The results of significant diagnostics from this hospitalization (including imaging, microbiology, ancillary and laboratory) are listed below for reference.    Significant Diagnostic Studies: DG CHEST PORT 1  VIEW  Result Date: 06/28/2023 CLINICAL DATA:  Pneumonia. EXAM: PORTABLE CHEST 1 VIEW COMPARISON:  One-view chest x-ray 06/20/2023. FINDINGS: The tip of the tracheostomy tube terminates just above the carina. The right sided PICC line was removed. Mild edema is present bilaterally. Bibasilar airspace opacities demonstrates some progression. IMPRESSION: 1. Progression of bibasilar airspace opacities concerning for pneumonia. 2. Mild edema bilaterally. Electronically Signed   By: Marin Roberts M.D.   On: 06/28/2023 09:12   DG CHEST PORT 1 VIEW  Result Date: 06/24/2023 CLINICAL DATA:  Respiratory failure. EXAM: PORTABLE CHEST 1 VIEW COMPARISON:  06/20/2023 FINDINGS: Tracheostomy tube tip is just above the carina, approximately 9 mm. Findings are similar to the recent comparison examination. There is a right arm PICC line with the tip in the SVC region. Nasogastric feeding tube extends into the abdomen and appears to be in the stomach body region. In addition, the patient has a gastrostomy tube. Patchy parenchymal lung densities bilaterally. Parenchymal lung densities may have slightly increased since 06/20/2023. Heart size is stable. IMPRESSION: 1. Bilateral patchy parenchymal lung densities may have slightly increased since 06/20/2023. These findings are nonspecific. 2. Support apparatuses as described. Electronically Signed   By: Richarda Overlie M.D.   On: 06/24/2023 13:48   IR GASTROSTOMY TUBE MOD SED  Result Date: 06/24/2023 INDICATION: 67 year old male referred for gastrostomy EXAM: PERC PLACEMENT GASTROSTOMY MEDICATIONS: 2 g Ancef; Antibiotics were administered within 1 hour of the procedure. ANESTHESIA/SEDATION: Versed 2.0 mg IV; Fentanyl 100 mcg IV Moderate Sedation Time:  10 minutes The patient was continuously monitored during the procedure by the interventional radiology nurse under my direct supervision. CONTRAST:  15mL OMNIPAQUE IOHEXOL 300 MG/ML SOLN - administered into the gastric lumen.  FLUOROSCOPY: Radiation Exposure Index (as provided by the fluoroscopic device): 15 mGy Kerma COMPLICATIONS: None PROCEDURE: Informed written consent was obtained from the patient and the patient's family after a thorough discussion of the procedural risks, benefits and alternatives. All questions were addressed. Maximal Sterile Barrier Technique was utilized including caps, mask, sterile gowns, sterile gloves, sterile drape, hand hygiene and skin antiseptic. A timeout was performed prior to the initiation of the procedure. The epigastrium was prepped with Betadine  in a sterile fashion, and a sterile drape was applied covering the operative field. A sterile gown and sterile gloves were used for the procedure. A 5-French orogastric tube is placed under fluoroscopic guidance. Scout imaging of the abdomen confirms barium within the transverse colon. The stomach was distended with gas. Under fluoroscopic guidance, an 18 gauge needle was utilized to puncture the anterior wall of the body of the stomach. An Amplatz wire was advanced through the needle passing a T fastener into the lumen of the stomach. The T fastener was secured for gastropexy. A 9-French sheath was inserted. A snare was advanced through the 9-French sheath. A Teena Dunk was advanced through the orogastric tube. It was snared then pulled out the oral cavity, pulling the snare, as well. The leading edge of the gastrostomy was attached to the snare. It was then pulled down the esophagus and out the percutaneous site. Tube secured in place. Contrast was injected. Patient tolerated the procedure well and remained hemodynamically stable throughout. No complications were encountered and no significant blood loss encountered. IMPRESSION: Status post image guided percutaneous gastrostomy. Signed, Yvone Neu. Miachel Roux, RPVI [facility] Radiology Electronically Signed   By: Gilmer Mor D.O.   On: 06/24/2023 10:03    DG Abd Portable 1V  Result Date: 06/23/2023 CLINICAL DATA:  [id number] Dysphagia [id number]. EXAM: PORTABLE ABDOMEN - 1 VIEW COMPARISON:  Abdominal radiograph 06/09/2023. FINDINGS: Feeding tube tip projects over the stomach. Improved aeration of the lung bases with persistent patchy opacities, possibly representing residual consolidation or atelectasis. Enteric contrast is seen throughout the bowel. No evidence of bowel obstruction. IMPRESSION: 1. Feeding tube tip projects over the stomach. 2. Improved aeration of the lung bases with persistent patchy opacities, possibly representing residual consolidation or atelectasis. Electronically Signed   By: Orvan Falconer M.D.   On: 06/23/2023 09:15   MR BRAIN WO CONTRAST  Result Date: 06/21/2023 CLINICAL DATA:  Mental status change, unknown cause. History of colon cancer. EXAM: MRI HEAD WITHOUT CONTRAST TECHNIQUE: Multiplanar, multiecho pulse sequences of the brain and surrounding structures were obtained without intravenous contrast. COMPARISON:  Head CT 06/19/2023. FINDINGS: Brain: No acute infarct or hemorrhage. Mild chronic small-vessel disease. No hydrocephalus or extra-axial collection. No mass or midline shift. No foci of abnormal susceptibility. Vascular: Normal flow voids. Skull and upper cervical spine: Normal marrow signal. Sinuses/Orbits: No acute findings. Trace fluid signal in the bilateral mastoid air cells. Other: None. IMPRESSION: No acute intracranial process. Electronically Signed   By: Orvan Falconer M.D.   On: 06/21/2023 18:50   CT ABDOMEN WO CONTRAST  Result Date: 06/21/2023 CLINICAL DATA:  dysphagia.  Percutaneous gastrostomy evaluation. EXAM: CT ABDOMEN WITHOUT CONTRAST TECHNIQUE: Multidetector CT imaging of the abdomen was performed following the standard protocol without IV contrast. RADIATION DOSE REDUCTION: This exam was performed according to the departmental dose-optimization program which includes automated exposure control,  adjustment of the mA and/or kV according to patient size and/or use of iterative reconstruction technique. COMPARISON:  CT AP, 12/10/2005. Chest XR, 06/10/2023. CT chest, 06/15/2023. FINDINGS: Lower chest: Moderate-to-moderate burden of coronary atherosclerosis and borderline cardiac enlargement. Bibasilar pulmonary interstitial thickening and dependent pulmonary consolidations. Hepatobiliary: No focal liver abnormality. cholecystectomy. No biliary dilatation. Pancreas: No pancreatic ductal dilatation or surrounding inflammatory changes. Spleen: Normal in size without focal abnormality. Small perihilar accessory spleen. Adrenals/Urinary Tract: Adrenal glands are unremarkable. Kidneys are normal, without renal calculi, focal lesion, or hydronephrosis. Stomach/Bowel: Small bore enteric feeding tube, with catheter tip at  the gastric antrum. Stomach is decompressed and within normal limits. Appendix is surgically absent. Postsurgical changes of RIGHT hemicolectomy with intact-appearing anastomosis. Nonobstructed small bowel. Nondilated colon. No evidence of bowel wall thickening, distention, or inflammatory changes. Vascular/Lymphatic: Aortic atherosclerosis. No enlarged abdominal lymph nodes. Other: Small fat-containing umbilical hernia. Small RIGHT anterior abdominal wall subcutaneous contusion, likely injection site. Musculoskeletal: Degenerative changes of the spine. No acute or significant osseous findings. IMPRESSION: 1. Intervening viscera with the splenic flexure and distal transverse colon overlying the gastric body and antrum. Anatomy is equivocal for percutaneous gastrostomy placement, with a trial of contrast opacification of colon and insufflation recommended. Final decision making is reserved for the performing physician. 2. No acute abdominal process. incidental, chronic and senescent findings as above. Roanna Banning, MD [facility] Radiology Electronically  Signed   By: Roanna Banning M.D.   On: 06/21/2023 16:24   DG CHEST PORT 1 VIEW  Result Date: 06/20/2023 CLINICAL DATA:  Dyspnea EXAM: PORTABLE CHEST 1 VIEW COMPARISON:  06/19/2023 FINDINGS: Tracheostomy tip noted 11 mm above the carina. Nasoenteric feeding tube tip overlies the expected mid to distal body of the stomach. Right upper extremity PICC line tip noted at the superior cavoatrial junction. Lung volumes are small, but are symmetric and are stable since prior examination. Bibasilar pulmonary infiltrates have improved in the interval since prior examination in keeping with a resolving infectious or inflammatory process. No pneumothorax or pleural effusion. Cardiac size within normal limits. The thoracic aorta is tortuous and ectatic, better assessed on prior CT examination of 06/15/2023. No acute bone abnormality. IMPRESSION: 1. Support lines and tubes as outlined above. 2. Pulmonary hypoinflation. 3. Improving bibasilar pulmonary infiltrates. Electronically Signed   By: Helyn Numbers M.D.   On: 06/20/2023 16:23   CT HEAD WO CONTRAST ( )  Result Date: 06/20/2023 CLINICAL DATA:  Right-sided weakness, anisocoria, stroke suspected EXAM: CT HEAD WITHOUT CONTRAST TECHNIQUE: Contiguous axial images were obtained from the base of the skull through the vertex without intravenous contrast. RADIATION DOSE REDUCTION: This exam was performed according to the departmental dose-optimization program which includes automated exposure control, adjustment of the mA and/or kV according to patient size and/or use of iterative reconstruction technique. COMPARISON:  06/15/2023 FINDINGS: Brain: No evidence of acute infarction, hemorrhage, mass, mass effect, or midline shift. No hydrocephalus or extra-axial fluid collection. Vascular: No hyperdense vessel. Skull: Negative for fracture or focal lesion. Sinuses/Orbits: No acute finding. Other: Fluid in the left-greater-than-right mastoid air cells. IMPRESSION: No acute  intracranial process. Electronically Signed   By: Wiliam Ke M.D.   On: 06/20/2023 01:48   DG CHEST PORT 1 VIEW  Result Date: 06/19/2023 CLINICAL DATA:  Dyspnea. EXAM: PORTABLE CHEST 1 VIEW COMPARISON:  06/18/2023 FINDINGS: Support apparatus remains in appropriate position. Low lung volumes are again seen. Bibasilar predominant airspace opacity shows no significant change. IMPRESSION: No significant change in low lung volumes and bibasilar airspace disease. Electronically Signed   By: Danae Orleans M.D.   On: 06/19/2023 15:39   DG Chest Port 1 View  Result Date: 06/18/2023 CLINICAL DATA:  Status post tracheostomy EXAM: PORTABLE CHEST 1 VIEW COMPARISON:  Chest x-ray dated June 18, 2023 FINDINGS: Cardiac and mediastinal contours are unchanged. Unchanged position of right arm PICC. Interval placement of tracheostomy tube. Feeding tube partially seen coursing below the diaphragm. Unchanged bibasilar consolidations. No evidence of pneumothorax. No large pleural effusion. IMPRESSION: 1. Interval placement of tracheostomy tube. 2. Unchanged bibasilar consolidations, concerning for infection or aspiration.  Electronically Signed   By: Allegra Lai M.D.   On: 06/18/2023 15:58   Portable Chest x-ray  Result Date: 06/18/2023 CLINICAL DATA:  [id number].  Ventilator dependent respiratory failure. EXAM: PORTABLE CHEST 1 VIEW COMPARISON:  Chest CT without contrast 06/15/2023 FINDINGS: 6:19 a.m. Interval removal of tracheostomy cannula with interval intubation. Tip of the ETT is only 3 mm above the carina and just above the orifice of the right main bronchus and should be withdrawn 4 cm to a mid tracheal position. Feeding tube with weighted tip again terminates either in the proximal gastric antrum or distal body of stomach. Right PICC tip remains in the distal SVC. There is continued patchy consolidation in the lower lung fields with small pleural effusions. Mild central interstitial edema. The heart is  enlarged. Mediastinum is stable with aortic tortuosity and calcification. Mild central vascular prominence. Multiple overlying monitor wires. No acute osseous findings. Stable overall aeration. IMPRESSION: 1. ETT tip is only 3 mm above the carina and should be withdrawn 4 cm to a mid tracheal position. 2. Other support apparatus stable. 3. Stable cardiomegaly with central vascular prominence and mild central interstitial edema. 4. Stable patchy consolidation in the lower lung fields with small pleural effusions. Electronically Signed   By: Almira Bar M.D.   On: 06/18/2023 06:49   VAS [place] LOWER EXTREMITY VENOUS (DVT)  Result Date: 06/16/2023  Lower Venous DVT Study Patient Name:  Joshua Eaton  Date of Exam:   06/16/2023 Medical Rec #: [id number]   Accession #:    [id number] Date of Birth: Jul 01, 1956    Patient Gender: M Patient Age:   52 years Exam Location:  [place] [facility] Procedure:      VAS [place] LOWER EXTREMITY VENOUS (DVT) Referring Phys: CHI ELLISON --------------------------------------------------------------------------------  Indications: Fever. Other Indications: Critically ill patient, prolonged immobility. Limitations: Poor ultrasound/tissue interface and patient movement. Comparison Study: Previous exam at [facility] on 03/07/2018 was negative for DVT Performing Technologist: Ernestene Mention RVT, RDMS  Examination Guidelines: A complete evaluation includes B-mode imaging, spectral Doppler, color Doppler, and power Doppler as needed of all accessible portions of each vessel. Bilateral testing is considered an integral part of a complete examination. Limited examinations for reoccurring indications may be performed as noted. The reflux portion of the exam is performed with the patient in reverse Trendelenburg.  +---------+---------------+---------+-----------+----------+-------------------+ RIGHT    CompressibilityPhasicitySpontaneityPropertiesThrombus Aging       +---------+---------------+---------+-----------+----------+-------------------+ CFV      Full           No       Yes                                      +---------+---------------+---------+-----------+----------+-------------------+ SFJ      Full                                                             +---------+---------------+---------+-----------+----------+-------------------+ FV Prox  Full           Yes      Yes                                      +---------+---------------+---------+-----------+----------+-------------------+  FV Mid   Full           Yes      Yes                                      +---------+---------------+---------+-----------+----------+-------------------+ FV DistalFull           Yes      Yes                                      +---------+---------------+---------+-----------+----------+-------------------+ PFV      Full                                                             +---------+---------------+---------+-----------+----------+-------------------+ POP      Full           Yes      Yes                                      +---------+---------------+---------+-----------+----------+-------------------+ PTV      Full                                         Not well visualized +---------+---------------+---------+-----------+----------+-------------------+ PERO     Full                                         Not well visualized +---------+---------------+---------+-----------+----------+-------------------+   +---------+---------------+---------+-----------+----------+-------------------+ LEFT     CompressibilityPhasicitySpontaneityPropertiesThrombus Aging      +---------+---------------+---------+-----------+----------+-------------------+ CFV      Full           Yes      Yes                                      +---------+---------------+---------+-----------+----------+-------------------+  SFJ      Full                                                             +---------+---------------+---------+-----------+----------+-------------------+ FV Prox  Full           Yes      Yes                                      +---------+---------------+---------+-----------+----------+-------------------+ FV Mid   Full           Yes      Yes                                      +---------+---------------+---------+-----------+----------+-------------------+  FV DistalFull           Yes      Yes                                      +---------+---------------+---------+-----------+----------+-------------------+ PFV      Full                                                             +---------+---------------+---------+-----------+----------+-------------------+ POP      Full           Yes      Yes                                      +---------+---------------+---------+-----------+----------+-------------------+ PTV      Full                                         Not well visualized +---------+---------------+---------+-----------+----------+-------------------+ PERO     Full                                         Not well visualized +---------+---------------+---------+-----------+----------+-------------------+     Summary: BILATERAL: - No evidence of deep vein thrombosis seen in the lower extremities, bilaterally. -No evidence of popliteal cyst, bilaterally.   *See table(s) above for measurements and observations. Electronically signed by Coral Else MD on 06/16/2023 at 7:20:23 PM.    Final    VAS [place] UPPER EXTREMITY VENOUS DUPLEX  Result Date: 06/16/2023 UPPER VENOUS STUDY  Patient Name:  RONE LAVELL  Date of Exam:   06/16/2023 Medical Rec #: [id number]   Accession #:    [id number] Date of Birth: 1956-04-27    Patient Gender: M Patient Age:   29 years Exam Location:  [facility] Procedure:      VAS [place] UPPER EXTREMITY VENOUS DUPLEX Referring  Phys: CHI ELLISON --------------------------------------------------------------------------------  Indications: Fever Other Indications: Critically ill patient, BUE IV/line placements. Limitations: PICC line placement (RUE) and continuous patient movement/agitation. Comparison Study: No previous exams Performing Technologist: Jody Hill RVT, RDMS  Examination Guidelines: A complete evaluation includes B-mode imaging, spectral Doppler, color Doppler, and power Doppler as needed of all accessible portions of each vessel. Bilateral testing is considered an integral part of a complete examination. Limited examinations for reoccurring indications may be performed as noted.  Right Findings: +----------+------------+---------+-----------+----------+-------+ RIGHT     CompressiblePhasicitySpontaneousPropertiesSummary +----------+------------+---------+-----------+----------+-------+ IJV           Full       Yes       Yes                      +----------+------------+---------+-----------+----------+-------+ Subclavian    Full       Yes       Yes                      +----------+------------+---------+-----------+----------+-------+ Axillary    Partial  Yes       Yes               Acute  +----------+------------+---------+-----------+----------+-------+ Brachial      Full                                          +----------+------------+---------+-----------+----------+-------+ Radial        Full                                          +----------+------------+---------+-----------+----------+-------+ Ulnar         Full                                          +----------+------------+---------+-----------+----------+-------+ Cephalic      None       No        No                Acute  +----------+------------+---------+-----------+----------+-------+ Basilic       Full                                           +----------+------------+---------+-----------+----------+-------+ RUE limited due to patient movement/agitation.  Left Findings: +----------+------------+---------+-----------+----------+-------+ LEFT      CompressiblePhasicitySpontaneousPropertiesSummary +----------+------------+---------+-----------+----------+-------+ IJV           Full       Yes       Yes                      +----------+------------+---------+-----------+----------+-------+ Subclavian    Full       Yes       Yes                      +----------+------------+---------+-----------+----------+-------+ Axillary      Full       Yes       Yes                      +----------+------------+---------+-----------+----------+-------+ Brachial      Full       Yes       Yes                      +----------+------------+---------+-----------+----------+-------+ Radial        Full                                          +----------+------------+---------+-----------+----------+-------+ Ulnar         Full                                          +----------+------------+---------+-----------+----------+-------+ Cephalic      None       No        No  Acute  +----------+------------+---------+-----------+----------+-------+ Basilic       Full       Yes       Yes                      +----------+------------+---------+-----------+----------+-------+  Summary:  Right: Findings consistent with acute deep vein thrombosis involving the right axillary vein. Findings consistent with acute superficial vein thrombosis involving the right cephalic vein.  Left: Findings consistent with acute superficial vein thrombosis involving the left cephalic vein.  *See table(s) above for measurements and observations.  Diagnosing physician: Coral Else MD Electronically signed by Coral Else MD on 06/16/2023 at 7:19:36 PM.    Final    CT HEAD WO CONTRAST ( )  Result Date: 06/15/2023 CLINICAL  DATA:  Inpatient with encephalopathy. EXAM: CT HEAD WITHOUT CONTRAST TECHNIQUE: Contiguous axial images were obtained from the base of the skull through the vertex without intravenous contrast. RADIATION DOSE REDUCTION: This exam was performed according to the departmental dose-optimization program which includes automated exposure control, adjustment of the mA and/or kV according to patient size and/or use of iterative reconstruction technique. COMPARISON:  Head CT 05/21/2023 FINDINGS: Brain: There is mild cerebral atrophy and small-vessel disease, unremarkable cerebellum and brainstem. No asymmetry is seen concerning for a cortical based acute infarct, hemorrhage, mass or mass effect. There is no midline shift. The ventricles are normal in size and position. Basal cisterns are patent. Vascular: There are patchy calcifications in both carotid siphons. There are no hyperdense central vessels. Skull: Negative for fractures or focal lesions. Sinuses/Orbits: Clear paranasal sinuses. Negative visible orbits. Interval nasoenteric intubation. There is new patchy fluid in the left mastoid air cells, trace fluid in the right mastoid tip also new. Both middle ear cavities are clear. Other: None. IMPRESSION: 1. No acute intracranial CT findings or intracranial interval changes. 2. New patchy fluid in the left mastoid air cells and trace fluid in the right mastoid tip. 3. Interval nasoenteric intubation. Electronically Signed   By: Almira Bar M.D.   On: 06/15/2023 07:23   CT CHEST WO CONTRAST  Result Date: 06/15/2023 CLINICAL DATA:  Pulmonary venous congestion versus multifocal infection on chest x-rays. EXAM: CT CHEST WITHOUT CONTRAST TECHNIQUE: Multidetector CT imaging of the chest was performed following the standard protocol without IV contrast. RADIATION DOSE REDUCTION: This exam was performed according to the departmental dose-optimization program which includes automated exposure control, adjustment of the mA  and/or kV according to patient size and/or use of iterative reconstruction technique. COMPARISON:  Portable chest 06/12/2023, portable chest 06/09/2023, chest CT without contrast 05/31/2023, CTA chest 05/20/2023. FINDINGS: Cardiovascular: There is mild cardiomegaly with a left chamber predominance. Scattered two-vessel coronary calcific plaques in the circumflex and LAD. There is no substantial pericardial effusion. Scattered calcific plaques noted in the aorta and great vessels but no aortic aneurysm. Mild aortic tortuosity. Central pulmonary veins are slightly prominent. Pulmonary arteries are normal caliber. A right PICC terminates in the distal SVC. Mediastinum/Nodes: Assessment for hilar adenopathy is difficult without contrast but there is no contour deforming abnormality. Tracheostomy cannula terminates 2 cm above the carina. The trachea is clear below the cannula but above the cannula there are moderate layering secretions in the distal cervical trachea. There is a feeding tube in the esophagus with the tip in the distal gastric antrum. No thyroid nodule is seen, no intrathoracic, axillary or supraclavicular adenopathy. Lungs/Pleura: There are minimal layering pleural effusions. There is subpleural septal thickening in the lung fields consistent with  interstitial edema, improved compared with 05/31/2023. There is confluent consolidation with air bronchograms in the bilateral lower lobes primarily the basilar segments which is probably due to pneumonia or aspiration. This is similar to the prior study. Patchy hazy airspace disease is noted in the bilateral upper and right middle lobes where the prior CT demonstrated patchy dense airspace disease. This is probably either ground-glass edema or ground-glass pneumonitis. Upper Abdomen: No acute abnormality. No splenomegaly with AP splenic dimension 16.5 cm. Musculoskeletal: No chest wall mass or suspicious bone lesions identified. IMPRESSION: 1. Cardiomegaly with  slight prominence of the central pulmonary veins and minimal layering pleural effusions. 2. Interstitial edema in the lung fields, improved compared with 05/31/2023. 3. Confluent consolidation with air bronchograms in the bilateral lower lobes primarily the basilar segments, probably due to pneumonia or aspiration. This is similar to the prior CT. 4. Patchy hazy opacities in the bilateral upper and right middle lobes where the prior CT demonstrated denser airspace disease. This is probably either ground-glass edema or ground-glass pneumonitis. 5. Aortic and coronary artery atherosclerosis. 6. Tracheostomy cannula and feeding tube. 7. Moderate layering secretions in the distal cervical trachea above the tracheostomy. 8. Splenomegaly. Electronically Signed   By: Almira Bar M.D.   On: 06/15/2023 07:14   DG CHEST PORT 1 VIEW  Result Date: 06/12/2023 CLINICAL DATA:  [id number] Hypoxia [id number] EXAM: PORTABLE CHEST 1 VIEW COMPARISON:  CXR 06/09/23 FINDINGS: Tracheostomy in place. Weighted enteric tube courses below diaphragm with tip positioned near the pylorus. Tracheostomy in place. Right arm PICC with the tip in the lower SVC. No pleural effusion. No pneumothorax. Cardiomegaly. Persistent prominent bilateral interstitial opacities that could represent pulmonary venous congestion or multifocal infection. No radiographically apparent displaced rib fractures. Visualized upper abdomen is unremarkable IMPRESSION: 1. Interval placement of a right arm PICC with the tip positioned in the lower SVC. Additional support apparatus as above. 2. Otherwise no significant change from prior exam with persistent prominent bilateral interstitial opacities. Electronically Signed   By: Lorenza Cambridge M.D.   On: 06/12/2023 11:15   US Abdomen Limited RUQ (LIVER/GB)  Result Date: 06/10/2023 CLINICAL DATA:  Fever EXAM: ULTRASOUND ABDOMEN LIMITED RIGHT UPPER QUADRANT COMPARISON:  X-ray 06/09/2023 FINDINGS: Gallbladder: Gallbladder not  seen.  Please correlate with surgical history. Common bile duct: Diameter: 4 mm Liver: Mildly increased echogenic hepatic parenchyma consistent with a component of fatty infiltration. Portal vein is patent on color Doppler imaging with normal direction of blood flow towards the liver. Other: Limited by overlapping bowel gas and soft tissue. IMPRESSION: Slight fatty liver infiltration.  No ductal dilatation. Gallbladder not seen.  Please correlate for any history of surgery Electronically Signed   By: Karen Kays M.D.   On: 06/10/2023 19:53   ECHO TEE  Result Date: 06/09/2023    TRANSESOPHOGEAL ECHO REPORT   Patient Name:   MACKLAN GUAGLIARDO Date of Exam: 06/09/2023 Medical Rec #:  [id number]  Height:       73.0 in Accession #:    [id number] Weight:       205.9 lb Date of Birth:  [birth year]   BSA:          2.178 m Patient Age:    67 years   BP:           116/40 mmHg Patient Gender: M          HR:           52 bpm. Exam Location:  Inpatient Procedure: Transesophageal Echo Indications:  mitral regurgitation  History:         Patient has prior history of Echocardiogram examinations, most                  recent 06/04/2023. COPD.  Sonographer:     Delcie Roch RDCS Referring Phys:  [id number] Perlie Gold Diagnosing Phys: Chilton Si MD  Sonographer Comments: Echo performed with patient supine and on artificial respirator. PROCEDURE: After discussion of the risks and benefits of a TEE, an informed consent was obtained from a family member. The patient was intubated. The transesophogeal probe was passed without difficulty through the esophogus of the patient. Imaged were obtained with the patient in a left lateral decubitus position. Sedation performed by performing physician. The patient's vital signs; including heart rate, blood pressure, and oxygen saturation; remained stable throughout the procedure. The patient developed no complications during the procedure.  IMPRESSIONS  1. Images were technically challenging  due to patient movement and movement/impedence from the NG tube.  2. Left ventricular ejection fraction, by estimation, is 60 to 65%. The left ventricle has normal function. The left ventricle has no regional wall motion abnormalities.  3. Right ventricular systolic function is normal. The right ventricular size is normal.  4. No left atrial/left atrial appendage thrombus was detected.  5. Eccentric mitral regurgitation with Coanda effect. There is no pulmonary vein systolic reversal. There is at least moderate mitral regurgitation, likely severe. 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 The mitral valve is normal in  structure. No evidence of mitral valve regurgitation. No evidence of mitral stenosis. There is severe prolapse of the middle segment of the anterior leaflet of the mitral valve.  6. The aortic valve is tricuspid. Aortic valve regurgitation is moderate to severe. No aortic stenosis is present. Aortic regurgitation PHT measures 411 msec.  7. The inferior vena cava is normal in size with greater than 50% respiratory variability, suggesting right atrial pressure of 3 mmHg. FINDINGS  Left Ventricle: Left ventricular ejection fraction, by estimation, is 60 to 65%. The left ventricle has normal function. The left ventricle has no regional wall motion abnormalities. The left ventricular internal cavity size was normal in size. There is  no left ventricular hypertrophy. Right Ventricle: The right ventricular size is normal. No increase in right ventricular wall thickness. Right ventricular systolic function is normal. Left Atrium: Left atrial size was normal in size. No left atrial/left atrial appendage thrombus was detected. Right Atrium: Right atrial size was normal in size. Pericardium: There is no evidence of pericardial effusion. Mitral Valve: Eccentric mitral regurgitation with Coanda effect. There is no pulmonary vein systolic reversal. There is at least moderate mitral regurgitation,  likely severe. 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 The mitral valve is  normal in structure. There is severe prolapse of the middle segment of the anterior leaflet of the mitral valve. No evidence of mitral valve regurgitation. No evidence of mitral valve stenosis. Tricuspid Valve: The tricuspid valve is normal in structure. Tricuspid valve regurgitation is not demonstrated. No evidence of tricuspid stenosis. Aortic Valve: The aortic valve is tricuspid. Aortic valve regurgitation is moderate to severe. Aortic regurgitation PHT measures 411 msec. No aortic stenosis is present. Pulmonic Valve: The pulmonic valve was normal in structure. Pulmonic valve regurgitation is not visualized. No evidence of pulmonic stenosis. Aorta: The aortic root is normal in size and structure. Venous: The inferior vena cava is normal in size with greater than 50% respiratory variability, suggesting right  atrial pressure of 3 mmHg. IAS/Shunts: No atrial level shunt detected by color flow Doppler. Additional Comments: There is a small pleural effusion in the left lateral region. AORTIC VALVE AI PHT:      411 msec Chilton Si MD Electronically signed by Chilton Si MD Signature Date/Time: 06/09/2023/4:27:07 PM    Final    DG Abd Portable 1V  Result Date: 06/09/2023 CLINICAL DATA:  Feeding tube placement. EXAM: PORTABLE ABDOMEN - 1 VIEW COMPARISON:  06/04/2023 FINDINGS: Tip of the weighted enteric tube is below the diaphragm in the region of the mid stomach. Generalized paucity of upper abdominal bowel gas. Right upper quadrant surgical clips. IMPRESSION: Tip of the weighted enteric tube below the diaphragm in the region of the mid stomach. Electronically Signed   By: Narda Rutherford M.D.   On: 06/09/2023 16:20   DG CHEST PORT 1 VIEW  Result Date: 06/09/2023 CLINICAL DATA:  Fever. EXAM: PORTABLE CHEST 1 VIEW COMPARISON:  June 06, 2023. FINDINGS: Stable cardiomediastinal silhouette. Tracheostomy and  feeding tubes are unchanged. Stable bilateral lung opacities are noted concerning for edema or pneumonia. Small pleural effusions may be present. Bony thorax is unremarkable. IMPRESSION: Stable support apparatus.  Stable bilateral lung opacities. Electronically Signed   By: Lupita Raider M.D.   On: 06/09/2023 12:39   [place] EKG SITE RITE  Result Date: 06/09/2023 If Site Rite image not attached, placement could not be confirmed due to current cardiac rhythm.  DG Chest Port 1 View  Result Date: 06/06/2023 CLINICAL DATA:  Tracheostomy EXAM: PORTABLE CHEST 1 VIEW COMPARISON:  One-view chest x-ray 06/04/2023 FINDINGS: A lordotic view is submitted.  Lung apices are incompletely imaged. Tracheostomy tube terminates 3 cm above the carina. A small bore feeding tube courses off the inferior border the film. Heart is enlarged. Interstitial and airspace opacities are present in the lower lobes bilaterally. Bilateral pleural effusions are again noted, right greater than left. IMPRESSION: 1. Tracheostomy tube terminates 3 cm above the carina. 2. Small bore feeding tube courses off the inferior border the film. 3. Stable cardiomegaly. 4. Stable interstitial and airspace opacities representing edema or infection. Electronically Signed   By: Marin Roberts M.D.   On: 06/06/2023 12:39   ECHOCARDIOGRAM COMPLETE  Result Date: 06/04/2023    ECHOCARDIOGRAM REPORT   Patient Name:   Joshua Eaton Date of Exam: 06/04/2023 Medical Rec #:  [id number]  Height:       73.0 in Accession #:    [id number] Weight:       225.3 lb Date of Birth:  Oct 21, 1955   BSA:          2.263 m Patient Age:    67 years   BP:           123/46 mmHg Patient Gender: M          HR:           69 bpm. 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                 Mitral Valve Disease and Aortic Valve Disease; Risk                  Factors:Non-Smoker.  Sonographer:    Dondra Prader RVT RCS Referring Phys: [id number] Clarene [facility]  Sonographer Comments: Suboptimal parasternal window.  Image acquisition challenging due to patient body habitus and Image acquisition challenging due to respiratory motion. IMPRESSIONS  1. There is moderate prolaspe of the anterior mitral leaflet, unable to determine if flail, recommend TEE. The mitral valve is abnormal. Severe mitral valve regurgitation. No evidence of mitral stenosis.  2. Left ventricular ejection fraction, by estimation, is 60 to 65%. The left ventricle has normal function. The left ventricle has no regional wall motion abnormalities. There is moderate asymmetric left ventricular hypertrophy of the inferior segment. Left ventricular diastolic function could not be evaluated.  3. Right ventricular systolic function is normal. The right ventricular size is normal.  4. Left atrial size was severely dilated.  5. The aortic valve is normal in structure. Aortic valve regurgitation is mild to moderate. Aortic valve sclerosis is present, with no evidence of aortic valve stenosis. Aortic regurgitation PHT measures 362 msec.  6. The inferior vena cava is normal in size with greater than 50% respiratory variability, suggesting right atrial pressure of 3 mmHg. FINDINGS  Left Ventricle: Left ventricular ejection fraction, by estimation, is 60 to 65%. The left ventricle has normal function. The left ventricle has no regional wall motion abnormalities. The left ventricular internal cavity size was normal in size. There is  moderate asymmetric left ventricular hypertrophy of the inferior segment. Left ventricular diastolic function could not be evaluated due to mitral regurgitation (moderate or greater). Left ventricular diastolic function could not be evaluated. Right Ventricle: The right ventricular size is normal. No increase in right ventricular wall thickness. Right ventricular systolic function is normal.  Left Atrium: Left atrial size was severely dilated. Right Atrium: Right atrial size was normal in size. Pericardium: Trivial pericardial effusion is present. The pericardial effusion is anterior to the right ventricle. Mitral Valve: There is moderate prolaspe of the anterior mitral leaflet, unable to determine if flail, recommend TEE. The mitral valve is abnormal. Severe mitral valve regurgitation. No evidence of mitral valve stenosis. MV peak gradient, 99.6 mmHg. The mean mitral valve gradient is 81.0 mmHg. Tricuspid Valve: The tricuspid valve is normal in structure. Tricuspid valve regurgitation is not demonstrated. No evidence of tricuspid stenosis. Aortic Valve: The aortic valve is normal in structure. Aortic valve regurgitation is mild to moderate. Aortic regurgitation PHT measures 362 msec. Aortic valve sclerosis is present, with no evidence of aortic valve stenosis. Aortic valve mean gradient measures 8.2 mmHg. Aortic valve peak gradient measures 15.5 mmHg. Aortic valve area, by VTI measures 3.52 cm. Pulmonic Valve: The pulmonic valve was not well visualized. Aorta: The aortic root is normal in size and structure. Venous: The inferior vena cava is normal in size with greater than 50% respiratory variability, suggesting right atrial pressure of 3 mmHg. IAS/Shunts: No atrial level shunt detected by color flow Doppler.  LEFT VENTRICLE PLAX 2D LVIDd:         5.30 cm   Diastology LVIDs:         3.50 cm   LV e' medial:    9.46 cm/s LV PW:         1.40 cm   LV E/e' medial:  16.7 LV IVS:        1.00 cm   LV e' lateral:   14.70 cm/s LVOT diam:     2.10 cm   LV E/e' lateral: 10.7 LV SV:         130 LV SV Index:   57 LVOT Area:     3.46 cm  RIGHT VENTRICLE  IVC RV S prime:     13.80 cm/s  IVC diam: 2.00 cm TAPSE (M-mode): 2.9 cm LEFT ATRIUM              Index        RIGHT ATRIUM           Index LA diam:        4.10 cm  1.81 cm/m   RA Area:     12.90 cm LA Vol (A2C):   140.0 ml 61.85 ml/m  RA Volume:    30.60 ml  13.52 ml/m LA Vol (A4C):   113.8 ml 50.28 ml/m LA Biplane Vol: 142.0 ml 62.74 ml/m  AORTIC VALVE AV Area (Vmax):    3.34 cm AV Area (Vmean):   3.13 cm AV Area (VTI):     3.52 cm AV Vmax:           196.80 cm/s AV Vmean:          130.600 cm/s AV VTI:            0.368 m AV Peak Grad:      15.5 mmHg AV Mean Grad:      8.2 mmHg LVOT Vmax:         190.00 cm/s LVOT Vmean:        118.000 cm/s LVOT VTI:          0.374 m LVOT/AV VTI ratio: 1.02 AI PHT:            362 msec  AORTA Ao Root diam: 3.50 cm Ao Asc diam:  3.50 cm MITRAL VALVE MV Area (PHT): 2.61 cm     SHUNTS MV Area VTI:   0.92 cm     Systemic VTI:  0.37 m MV Peak grad:  99.6 mmHg    Systemic Diam: 2.10 cm MV Mean grad:  81.0 mmHg MV Vmax:       4.99 m/s MV Vmean:      438.0 cm/s MV Decel Time: 291 msec MV E velocity: 158.00 cm/s MV A velocity: 124.00 cm/s MV E/A ratio:  1.27 Kardie Tobb DO Electronically signed by Thomasene Ripple DO Signature Date/Time: 06/04/2023/11:15:56 AM    Final    DG Abd 1 View  Result Date: 06/04/2023 CLINICAL DATA:  Feeding tube placement. EXAM: ABDOMEN - 1 VIEW COMPARISON:  June 03, 2023. FINDINGS: Distal tip of feeding tube is seen in expected position of distal stomach or proximal duodenum. IMPRESSION: Distal tip of feeding tube is seen in expected position of distal stomach or proximal duodenum. Electronically Signed   By: Lupita Raider M.D.   On: 06/04/2023 08:14   DG CHEST PORT 1 VIEW  Result Date: 06/04/2023 CLINICAL DATA:  Endotracheal tube. EXAM: PORTABLE CHEST 1 VIEW COMPARISON:  June 03, 2023. FINDINGS: Stable cardiomegaly. Endotracheal and feeding tubes are unchanged. Stable bilateral lung opacities are noted concerning for edema or pneumonia with associated pleural effusions. Bony thorax is unremarkable. IMPRESSION: Stable support apparatus.  Stable bilateral lung opacities. Electronically Signed   By: Lupita Raider M.D.   On: 06/04/2023 08:12   DG Abd 1 View  Result Date: 06/03/2023 CLINICAL  DATA:  [id number] with abdominal distention. EXAM: ABDOMEN - 1 VIEW COMPARISON:  Chest CT partially including the abdomen 05/31/2023 FINDINGS: 8:49 p.m.  Supine portable AP abdomen study in 2 films. A feeding tube is in place with a radiopaque weighted tip. The tip of the tube is either in the most distal gastric antrum or in the  bulb of the duodenum. There are cholecystectomy clips. No dilated bowel is seen. There is a general paucity of bowel aeration. The bladder is catheterized. There is no supine evidence for free air. No visible pathologic calcification. There are degenerative changes in the lumbar spine. Patchy opacities both lung bases are noted with moderate layering pleural effusions. Cardiomegaly. IMPRESSION: 1. The tip of the feeding tube is either in the most distal gastric antrum or in the bulb of the duodenum. 2. No dilated bowel segments, with a generalized paucity of bowel aeration noted. 3. Cardiomegaly with bilateral pleural effusions and bibasilar airspace opacities. Electronically Signed   By: Almira Bar M.D.   On: 06/03/2023 21:26   Portable Chest x-ray  Result Date: 06/03/2023 CLINICAL DATA:  Hypertension respiratory failure EXAM: PORTABLE CHEST 1 VIEW COMPARISON:  02/04/2016, chest x-ray 06/03/2023, 06/02/2023 FINDINGS: Endotracheal tube tip is about 3.8 cm superior to the carina. Esophageal tube tip extends below diaphragm but is incompletely visualized. Cardiomegaly. Widespread bilateral airspace disease and dense bibasilar consolidations. IMPRESSION: 1. Endotracheal tube tip about 3.8 cm superior to the carina. 2. Cardiomegaly with widespread bilateral airspace disease and dense bibasilar consolidations either due to edema or diffuse pneumonia. Electronically Signed   By: Jasmine Pang M.D.   On: 06/03/2023 19:54    Microbiology: Recent Results (from the past 240 hour(s))  Culture, Respiratory w Gram Stain     Status: None   Collection Time: 06/20/23  2:20 PM   Specimen: Tracheal  Aspirate; Respiratory  Result Value Ref Range Status   Specimen Description TRACHEAL ASPIRATE  Final   Special Requests NONE  Final   Gram Stain   Final    MODERATE WBC PRESENT, PREDOMINANTLY PMN FEW SQUAMOUS EPITHELIAL CELLS PRESENT FEW GRAM POSITIVE RODS MODERATE GRAM POSITIVE COCCI    Culture   Final    MODERATE Normal respiratory flora-no Staph aureus or Pseudomonas seen Performed at [facility], [address], [place], [place] [address]    Report Status 06/22/2023 FINAL  Final  Culture, Respiratory w Gram Stain     Status: None   Collection Time: 06/21/23  3:23 PM   Specimen: Tracheal Aspirate; Respiratory  Result Value Ref Range Status   Specimen Description TRACHEAL ASPIRATE  Final   Special Requests NONE  Final   Gram Stain   Final    NO WBC SEEN FEW GRAM POSITIVE RODS FEW GRAM POSITIVE COCCI    Culture   Final    MODERATE Consistent with normal respiratory flora. Performed at [facility], [address], [place], [place] [address]    Report Status 06/24/2023 FINAL  Final     Labs: Basic Metabolic Panel: Recent Labs  Lab 06/22/23 2250 06/23/23 0531 06/24/23 0504 06/25/23 0530 06/26/23 0736 06/26/23 1817 06/27/23 0442 06/27/23 0747 06/28/23 0353  NA 149* 144   < > 150* 152* 151*  --  149* 147*  K 3.8 5.1   < > 3.4* 4.0 3.5  --  3.4* 3.5  CL 110 109   < > 111 114* 112*  --  110 111  CO2 30 27   < > 29 29 29   --  30 30  GLUCOSE 126* 214*   < > 144* 178* 141*  --  150* 180*  BUN 65* 59*   < > 50* 65* 77*  --  69* 75*  CREATININE 0.88 0.86   < > 0.82 1.15 1.02  --  1.01 0.98  CALCIUM 9.7 8.9   < >  9.8 9.7 9.8  --  9.6 10.2  MG 2.6* 2.5*  --  2.2  --   --  2.5*  --  2.5*  PHOS  --   --   --  2.8 4.8*  --   --   --   --    < > = values in this interval not displayed.   Liver Function Tests: Recent Labs  Lab 06/25/23 0530 06/26/23 0736 06/26/23 1817 06/27/23 0747 06/28/23 0353  AST  --   --  36 48* 65*  ALT  --   --  25 30 44  ALKPHOS   --   --  58 62 68  BILITOT  --   --  0.9 0.7 0.9  PROT  --   --  6.3* 6.4* 6.4*  ALBUMIN 2.7* 2.6* 2.6* 2.6* 2.6*   No results for input(s): "LIPASE", "AMYLASE" in the last 168 hours. No results for input(s): "AMMONIA" in the last 168 hours. CBC: Recent Labs  Lab 06/23/23 0531 06/27/23 0747  WBC 5.7 10.1  NEUTROABS 3.4 6.7  HGB 8.9* 10.6*  HCT 30.4* 34.1*  MCV 107.0* 102.7*  PLT 135* 147*   Cardiac Enzymes: No results for input(s): "CKTOTAL", "CKMB", "CKMBINDEX", "TROPONINI" in the last 168 hours. BNP: BNP (last 3 results) Recent Labs    06/03/23 2112 06/14/23 0553 06/23/23 0150  BNP 564.4* 160.5* 142.6*    ProBNP (last 3 results) No results for input(s): "PROBNP" in the last 8760 hours.  CBG: Recent Labs  Lab 06/27/23 1920 06/28/23 0003 06/28/23 0428 06/28/23 0725 06/28/23 1110  GLUCAP 130* 132* 151* 105* 147*       Signed:  Rhetta Mura MD   [facility] 06/28/2023, 1:08 PM

## 2023-06-28 NOTE — Progress Notes (Addendum)
NAME:  Joshua Eaton, MRN:  [id number], DOB:  1956-05-23, LOS: 25 ADMISSION DATE:  06/03/2023, CHIEF COMPLAINT:  Respiratory failure   History of Present Illness:   This is a 67 year old male with a past medical history of colon cancer status post right hemicolectomy 2007, COPD, mitral valve Regurgitation who presented to [facility] health on 05/20/2023 for shortness of breath. Prior to presenting, patient was feeling unwell for about 6 days with cough, shortness of breath. He had an initial CXR showing bilateral airspace opacities and was eventually placed on BiPAP and admitted to ICU. On 9/20 he was intubated due to ongoing respiratory failure and antibiotics were widened for multifocal pneumonia. He had echo performed showing EF 60-65%, severe dilated LA, severe MR. Throughout his course at [facility] had ongoing ARDS picture, developed worsening AKI. Eventually, [facility] PCCM team was consulted for transfer here due to ongoing high vent requirements, unable to wean. Additionally, there was concern over need for MV replacement and possible need for right heart cath.    Pertinent  Medical History  Colon cancer, status post hemicolectomy (right) 2007, questionable COPD wears CPAP at home, mitral valve regurgitation   Significant Hospital Events: Including procedures, antibiotic start and stop dates in addition to other pertinent events   Admitted to [facility] health on 05/20/2023 06/03/2023: Transferred from [facility] to [facility] MRSA PCR negative 06/04/2023 Tracheal aspirate no growth 10/5 failed SBT 10/06: Tracheostomy  06/08/2023 Blood culture no growth as of 06/13/2023 06/09/2023: TEE performed at bedside 10/10: Mitral valve perforation on echo. Cardiology discussed limited options Tracheal aspirate normal for 10/11: CTS consulted 10/12: GOC with family yesterday. Currently not surgical candidate. Agreed to continue current care, Good UOP with diuresis, Diarrhea unchanged, Tolerating PS C.  difficile screen negative 10/18: overnight with cuff leak, trach evaluated, re-intubated from above, trach revision  10/19: doing well on SBT, sedation switched to precedex, plan for trach collar 10/20: arouses off sedation, tolerated SBT, trial of trach collar 10/21: Transferred back to vent 2/2 significant secretions 10/24: placed on trach collar, improving mentation (follows some commands) 10/25: remains on trach collar, answering yes/no questions  Interim History / Subjective:  Continues to improve with his mentation. Answering questions with speaking valve. No concerns or pain this morning.   Objective   Blood pressure (!) 124/51, pulse 83, temperature 98.5 F (36.9 C), temperature source Oral, resp. rate (!) 23, height 6' 0.99" (1.854 m), weight 81.1 kg, SpO2 95%.    FiO2 (%):  [28 %] 28 %   Intake/Output Summary (Last 24 hours) at 06/28/2023 1009 Last data filed at 06/28/2023 1000 Gross per 24 hour  Intake 1560 ml  Output 1350 ml  Net 210 ml   Filed Weights   06/26/23 0500 06/27/23 0457 06/28/23 0500  Weight: 84.9 kg 81.8 kg 81.1 kg    Examination: Constitutional: Ill-appearing elderly male.   Neck: Trach collar in place with speaking valve Cardio: Regular rate and rhythm.  Very prominent systolic murmur over the apex. Pulm: Coarse breath sounds in the anterior fields  Abdomen: Soft, non-tender, non-distended, positive bowel sounds. PEG tube in place MSK: no LE edema Skin:Warm and dry. Neuro: Awake and alert. Moves all extremities well. No focal deficits noted.   Assessment & Plan:   #Acute hypoxemic respiratory failure status post tracheostomy still requiring mechanical ventilation intermittently #Pulmonary edema secondary to HFpEF exacerbation, improved #Severe mitral regurgitation-not a surgical candidate #Aortic valve insufficiency #Multifocal pneumonia- completed abx 10/16 PLAN - Usual post trach bundle - PMV  when able - keep on trach collar as able,  change to cuffless trach   DISPO Remains in ICU while needing vent intermittently. 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-consulted cardiology and cardiothoracic surgery, not a surgical candidate HTN A-fib with RVR Acute metabolic encephalopathy 2/2 critical illness Upper extremity DVT Severe protein calorie malnutrition  BPH Hyperbilirubinemia Hypernatremia  Best Practice (right click and "Reselect all SmartList Selections" daily)   Diet/type: tubefeeds DVT prophylaxis: systemic dose LMWH GI prophylaxis: PPI Lines: N/A Foley:  N/A Code Status:  full code Last date of multidisciplinary goals of care discussion [06/28/2023]   Rocky Morel, DO Internal Medicine Resident, PGY-2 Pager# [phone] [facility] 06/28/2023 10:09 AM   For contact information, see Amion. If no response to pager, please call PCCM consult pager. After hours, 7PM- 7AM, please call Elink.

## 2023-06-28 NOTE — Plan of Care (Signed)
  Problem: Respiratory: Goal: Patent airway maintenance will improve Outcome: Progressing   Problem: Role Relationship: Goal: Ability to communicate will improve Outcome: Progressing

## 2023-06-28 NOTE — Evaluation (Signed)
Clinical/Bedside Swallow Evaluation Patient Details  Name: Joshua Eaton MRN: [id number] Date of Birth: 05-Dec-1955  Today's Date: 06/28/2023 Time: SLP Start Time (ACUTE ONLY): 1008 SLP Stop Time (ACUTE ONLY): 1029 SLP Time Calculation (min) (ACUTE ONLY): 21 min  Past Medical History:  Past Medical History:  Diagnosis Date   Anxiety    Cancer (HCC)    Depression    Hypertension    OCD (obsessive compulsive disorder)    Past Surgical History:  Past Surgical History:  Procedure Laterality Date   CHOLECYSTECTOMY     COLON SURGERY     cancerous polyp removed   HERNIA REPAIR     IR GASTROSTOMY TUBE MOD SED  06/24/2023   HPI:  This is a 67 year old male with a past medical history of colon cancer status post right hemicolectomy 2007, COPD, mitral valve Regurgitation who presented to [place] [facility] on 05/20/2023 for shortness of breath. Prior to presenting, patient was feeling unwell for about 6 days with cough, shortness of breath. He had an initial CXR showing bilateral airspace opacities and was eventually placed on BiPAP and admitted to ICU. On 9/20 he was intubated due to ongoing respiratory failure and antibiotics were widened for multifocal pneumonia. He had echo performed showing EF 60-65%, severe dilated LA, severe MR. Throughout his course at [facility] had ongoing ARDS picture, developed worsening AKI. Eventually, [facility] PCCM team was consulted for transfer here due to ongoing high vent requirements, unable to wean. S/p tracheostomy 10/6. Had cuff leak 10/18 and reintubated for trach revision. CXR 10/28: "Progression of bibasilar airspace opacities concerning for pneumonia."    Assessment / Plan / Recommendation  Clinical Impression  Pt presents with moderate risk of aspiration in setting of tracheostomy.  Pt with prolonged intubation, trach placed 10/6 and revision 10/1 requiring reintubation.  Pt has a Shiley 8 cuffed trach.  Pt was seend with PMSV in place for durtation of assessment.   SLP provided oral care prior to administration of PO trials.  Pt was resistant to oral care. Large amount of thick, dried red/than secretions removed from oral cavity.  Pt was also resistant to PO trials with poor bolus acceptance, withdrawing from thermal stimulation, shaking head, and stating no.  He did allow for placement of ice chips in oral cavity with encouragement from family. Sister [name] present throughout assessment and wife and son present for portions of assessment.  Immediate oral response noted with seemingly adquate mastication.  There was some anterior spillage from R, which is suspected to be at least in part due to positioning on R side 2/2 pressure ulcer. With small amount of water by spoon there was suspected premature spillage with audible gulping. CXR 10/28: "Progression of bibasilar airspace opacities concerning for pneumonia." Pt will need an instrumental swallow study prior to initiation of PO diet; however, given limited participation and refusal of bolus trials he is not yet ready for further assessment.  Pt would likely not tolerate scope passage given resistance to oral care and MBSS is prefered 2/2 quasi-invasive nature of FEES.  SLP will follow for additional PO trials to determine readiness for objective swallow study.   Recommend pt remain NPO with alternate means of nutrition, hydration, and medication.  Pt may have ice chips for comfort, after good oral care, in moderation, one at a time, by spoon, when fully awake/alert, with upright poistioning and 1:1 assistance.   SLP Visit Diagnosis: Dysphagia, unspecified (R13.10)    Aspiration Risk  Moderate aspiration risk    Diet  Recommendation NPO    Medication Administration: Via alternative means    Other  Recommendations Oral Care Recommendations: Oral care QID;Oral care prior to ice chip/H20    Recommendations for follow up therapy are one component of a multi-disciplinary discharge planning process, led by the  attending physician.  Recommendations may be updated based on patient status, additional functional criteria and insurance authorization.  Follow up Recommendations SLP at Long-term acute care hospital      Assistance Recommended at Discharge Frequent or constant Supervision/Assistance  Functional Status Assessment Patient has had a recent decline in their functional status and demonstrates the ability to make significant improvements in function in a reasonable and predictable amount of time.  Frequency and Duration min 2x/week  2 weeks       Prognosis Prognosis for improved oropharyngeal function: Fair      Swallow Study   General Date of Onset: 05/20/23 HPI: This is a 67 year old male with a past medical history of colon cancer status post right hemicolectomy 2007, COPD, mitral valve Regurgitation who presented to [facility] health on 05/20/2023 for shortness of breath. Prior to presenting, patient was feeling unwell for about 6 days with cough, shortness of breath. He had an initial CXR showing bilateral airspace opacities and was eventually placed on BiPAP and admitted to ICU. On 9/20 he was intubated due to ongoing respiratory failure and antibiotics were widened for multifocal pneumonia. He had echo performed showing EF 60-65%, severe dilated LA, severe MR. Throughout his course at [facility] had ongoing ARDS picture, developed worsening AKI. Eventually, [facility] PCCM team was consulted for transfer here due to ongoing high vent requirements, unable to wean. S/p tracheostomy 10/6. Had cuff leak 10/18 and reintubated for trach revision. CXR 10/28: "Progression of bibasilar airspace opacities concerning for pneumonia." Type of Study: Bedside Swallow Evaluation Previous Swallow Assessment: none Diet Prior to this Study: NPO;G-tube Temperature Spikes Noted: No Respiratory Status: Trach Collar History of Recent Intubation: Yes Total duration of intubation (days): 16 days (x2) Date extubated:  06/18/23 (trach placed 10/6 with reintubation for revision 10/18) Behavior/Cognition: Alert;Doesn't follow directions;Uncooperative Oral Cavity Assessment: Dried secretions Oral Care Completed by SLP: Yes Oral Cavity - Dentition: Adequate natural dentition Self-Feeding Abilities: Total assist Patient Positioning: Upright in bed Baseline Vocal Quality: Normal Volitional Cough: Cognitively unable to elicit Volitional Swallow: Unable to elicit    Oral/Motor/Sensory Function Overall Oral Motor/Sensory Function:  (Unable to assess)   Ice Chips Ice chips: Impaired Presentation: Spoon Oral Phase Functional Implications: Prolonged oral transit;Right anterior spillage   Thin Liquid Thin Liquid: Impaired Presentation: Spoon Oral Phase Impairments: Poor awareness of bolus Oral Phase Functional Implications: Right anterior spillage    Nectar Thick Nectar Thick Liquid: Not tested   Honey Thick Honey Thick Liquid: Not tested   Puree Puree: Not tested   Solid     Solid: Not tested      Kerrie Pleasure, MA, CCC-SLP Acute Rehabilitation Services Office: [phone] 06/28/2023,11:27 AM

## 2023-06-28 NOTE — Progress Notes (Addendum)
HOSPITALIST ROUNDING NOTE Kendyll Andreski MVH:[id number]  DOB: 1955/10/08  DOA: 06/03/2023  PCP: Kaleen Mask, MD  06/28/2023,7:15 AM   LOS: 25 days      Code Status: Remains full code From: Home  current Dispo:?  LTAC     66 year old white male Prior OCD with hospitalization back in 2017 continues to follow with behavioral health Colon cancer with right hemicolectomy in 2007,?  COPD wears CPAP at home Mitral valve repair? Admit 05/20/2023 SOB DOE-admitted to the ICU intubated 9/20 Developed ARDS picture at OSH [facility]) and given high vent requirements transferred to ICU [facility] 9/19 echo = severe MR moderate AI 10/6 tracheostomy 10/9 TEE performed-cardiology consulted severe MR based on echo versus mitral valve perforation with very limited options per cardiology 10/11 CT surgery consulted felt patient very poor candidate for any type of valvular repair 10/12 goals of care performed C. 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 Leafy Ro CVTS---- may eventually if recovers at [facility] be a candidate? A-fib RVR -tapering amiodarone 200 twice daily to 200 daily on 10/31, metoprolol now 25 bid-- Eliquis 5 twice daily per tube Diuresis has been held his albumin is low--weight is fluctuating between the 81 and 84 range-weight prior to admission is unknown  Hypoxic respiratory failure requiring occasional mechanical ventilation ---  multifocal pneumonia-completed 7 days of Zosyn ending 10/16 Last CXR 10/24 patchy parenchymal lung densities-- rpt 1 vw cxr suggestive Atelectasis  >pna-----monitor secretions aggressively-hob 45 deg, chest physio etc would hold ABx at this time and observe only   Hypernatremia---Azotemia free water to 200 every 8 --sodium slightly better Received Diamox 10/25-no further need  VTE with upper extremity DVT--R side picc removed Mild thrombocytopenia with macrocytic anemia Continue apixaban 5 twice daily short-term likely 1 to 3 months  Some ICU related delirium? Metabolic encephalopathy of critical illness--Intermittent 2/2 sedatives and meds as above only use low-dose Klonopin 1 mg per tube at bedtime--Discontinue gabapentin completely--- wean Seroquel from 100-->25 twice daily Much improved--- would not escalate meds again unless really needed  Severe protein energy malnutrition-PEG tube placed 10/24 cont feeds Feed induced diarrhea-resume Imodium 2 mg Q8-watch for persisting constipation  Anemia of critical illness-macrocytic anemia Plt improved from admission baseline hemoglobin in 2017 was 16 range--care with anticoagulation  Decubitus Pressure injury stage III and anus MASD on buttocks pressure injury on sacrum Turn as best possible manuka honey further dressings etc. Will review wound later today  Discussed in detail with son and wife at bedside-mentioned that his prognosis is relatively poor--I do not think they are ready for goals of care discussions day by day assessments of his mentation and overall clinical status-best served at a long-term acute care facility which can help him incrementally improve   DVT prophylaxis: Eliquis twice daily  Status is: Inpatient Remains inpatient appropriate because:   Requires LTAC when available--overall is stabilizing    Subjective:  Seems more interactive with SLP--refusing feeds More verbal Moving both lower extremities Family at bedside concerned about secretions, bedsores--- I have explained as best I can that it is unfortunately quite difficult to avoid this as  these are associated with critical illness   Objective + exam Vitals:   06/28/23 0300 06/28/23 0400 06/28/23 0500 06/28/23 0600  BP: (!) 124/99 (!) 116/97 (!) 127/108 (!) 141/61  Pulse: 82 87 87 81  Resp: (!) 22 (!) 23 (!) 24 18  Temp:      TempSrc:      SpO2: 93% 95% 94% 95%  Weight:   81.1 kg   Height:       Filed Weights   06/26/23 0500 06/27/23 0457 06/28/23 0500  Weight: 84.9 kg 81.8 kg 81.1 kg    Examination:  More interactive verbal with Passy-Muir valve Trach collar in place Abdomen is soft with PEG tube in place No lower extremity edema Foley catheter in place Decubitus ulcer is not reviewed today--will attempt later today  Data Reviewed: reviewed   CBC    Component Value Date/Time   WBC 10.1 06/27/2023 0747   RBC 3.32 (L) 06/27/2023 0747   HGB 10.6 (L) 06/27/2023 0747   HCT 34.1 (L) 06/27/2023 0747   PLT 147 (L) 06/27/2023 0747   MCV 102.7 (H) 06/27/2023 0747   MCH 31.9 06/27/2023 0747   MCHC 31.1 06/27/2023 0747   RDW 17.2 (H) 06/27/2023 0747   LYMPHSABS 2.4 06/27/2023 0747   MONOABS 0.8 06/27/2023 0747   EOSABS 0.1 06/27/2023 0747   BASOSABS 0.0 06/27/2023 0747      Latest Ref Rng & Units 06/28/2023    3:53 AM 06/27/2023    7:47 AM 06/26/2023    6:17 PM  CMP  Glucose 70 - 99 mg/dL 956  213  086   BUN 8 - 23 mg/dL 75  69  77   Creatinine 0.61 - 1.24 mg/dL 5.78  4.69  6.29   Sodium 135 - 145 mmol/L 147  149  151   Potassium 3.5 - 5.1 mmol/L 3.5  3.4  3.5   Chloride 98 - 111 mmol/L 111  110  112   CO2 22 - 32 mmol/L 30  30  29    Calcium 8.9 - 10.3 mg/dL 52.8  9.6  9.8   Total Protein 6.5 - 8.1 g/dL 6.4  6.4  6.3   Total Bilirubin 0.3 - 1.2 mg/dL 0.9  0.7  0.9   Alkaline Phos 38 - 126 U/L 68  62  58   AST 15 - 41 U/L 65  48  36   ALT 0 - 44 U/L 44  30  25      Scheduled Meds:  acetaminophen  1,000 mg Per Tube Q8H   amiodarone  200 mg Per Tube BID   Followed by   Melene Muller ON 07/01/2023] amiodarone  200 mg Per Tube Daily   apixaban  5 mg  Per Tube BID   Chlorhexidine Gluconate Cloth  6 each Topical Daily   clonazePAM  1 mg Per Tube QHS   feeding supplement (PROSource TF20)  60 mL Per Tube Daily   fiber supplement (BANATROL TF)  60 mL Per Tube TID   folic acid  1 mg Per Tube Daily   free water  200 mL Per Tube Q6H   Gerhardt's butt cream   Topical BID   insulin aspart  2 Units Subcutaneous Q4H   insulin aspart  2-6 Units Subcutaneous Q4H   leptospermum manuka honey  1 Application Topical Daily   loperamide HCl  2 mg Per Tube Q8H   metoprolol tartrate  12.5 mg Per Tube BID   multivitamin with minerals  1  tablet Per Tube Daily   neomycin-bacitracin-polymyxin  1 Application Topical Daily   nutrition supplement (JUVEN)  1 packet Per Tube BID BM   mouth rinse  15 mL Mouth Rinse 4 times per day   pantoprazole (PROTONIX) IV  40 mg Intravenous Q24H   QUEtiapine  25 mg Per Tube BID   sodium chloride flush  10-40 mL Intracatheter Q12H   thiamine  100 mg Per Tube Daily   Continuous Infusions:  feeding supplement (JEVITY 1.5 CAL/FIBER) 65 mL/hr at 06/28/23 0600    Time  68  Rhetta Mura, MD  [facility]

## 2023-06-29 LAB — PHOSPHORUS: Phosphorus: 3.4 mg/dL (ref 2.5–4.6)

## 2023-06-29 LAB — CBC WITH DIFFERENTIAL/PLATELET
Abs Immature Granulocytes: 0.08 10*3/uL — ABNORMAL HIGH (ref 0.00–0.07)
Basophils Absolute: 0 10*3/uL (ref 0.0–0.1)
Basophils Relative: 0 %
Eosinophils Absolute: 0.1 10*3/uL (ref 0.0–0.5)
Eosinophils Relative: 1 %
HCT: 35.5 % — ABNORMAL LOW (ref 39.0–52.0)
Hemoglobin: 11.2 g/dL — ABNORMAL LOW (ref 13.0–17.0)
Immature Granulocytes: 1 %
Lymphocytes Relative: 28 %
Lymphs Abs: 2.8 10*3/uL (ref 0.7–4.0)
MCH: 31.9 pg (ref 26.0–34.0)
MCHC: 31.5 g/dL (ref 30.0–36.0)
MCV: 101.1 fL — ABNORMAL HIGH (ref 80.0–100.0)
Monocytes Absolute: 0.7 10*3/uL (ref 0.1–1.0)
Monocytes Relative: 7 %
Neutro Abs: 6.4 10*3/uL (ref 1.7–7.7)
Neutrophils Relative %: 63 %
Platelets: 166 10*3/uL (ref 150–400)
RBC: 3.51 MIL/uL — ABNORMAL LOW (ref 4.22–5.81)
RDW: 17.1 % — ABNORMAL HIGH (ref 11.5–15.5)
WBC: 10.2 10*3/uL (ref 4.0–10.5)
nRBC: 0 % (ref 0.0–0.2)

## 2023-06-29 LAB — COMPREHENSIVE METABOLIC PANEL
ALT: 74 U/L — ABNORMAL HIGH (ref 0–44)
AST: 108 U/L — ABNORMAL HIGH (ref 15–41)
Albumin: 2.7 g/dL — ABNORMAL LOW (ref 3.5–5.0)
Alkaline Phosphatase: 90 U/L (ref 38–126)
Anion gap: 11 (ref 5–15)
BUN: 58 mg/dL — ABNORMAL HIGH (ref 8–23)
CO2: 27 mmol/L (ref 22–32)
Calcium: 10.2 mg/dL (ref 8.9–10.3)
Chloride: 114 mmol/L — ABNORMAL HIGH (ref 98–111)
Creatinine, Ser: 0.94 mg/dL (ref 0.61–1.24)
GFR, Estimated: >60 mL/min (ref >60)
Glucose, Bld: 131 mg/dL — ABNORMAL HIGH (ref 70–99)
Potassium: 3.9 mmol/L (ref 3.5–5.1)
Sodium: 152 mmol/L — ABNORMAL HIGH (ref 135–145)
Total Bilirubin: 1.1 mg/dL (ref 0.3–1.2)
Total Protein: 6.6 g/dL (ref 6.5–8.1)

## 2023-06-29 LAB — C DIFFICILE QUICK SCREEN W PCR REFLEX
C Diff antigen: NEGATIVE
C Diff interpretation: NOT DETECTED
C Diff toxin: NEGATIVE

## 2023-06-29 LAB — OSMOLALITY, URINE: Osmolality, Ur: 926 mosm/kg — ABNORMAL HIGH (ref 300–900)

## 2023-06-29 LAB — OSMOLALITY: Osmolality: 338 mosm/kg (ref 275–295)

## 2023-06-29 LAB — MAGNESIUM: Magnesium: 2.4 mg/dL (ref 1.7–2.4)

## 2023-06-30 LAB — COMPREHENSIVE METABOLIC PANEL WITH GFR
ALT: 48 U/L — ABNORMAL HIGH (ref 0–44)
AST: 55 U/L — ABNORMAL HIGH (ref 15–41)
Albumin: 2.4 g/dL — ABNORMAL LOW (ref 3.5–5.0)
Alkaline Phosphatase: 69 U/L (ref 38–126)
Anion gap: 5 (ref 5–15)
BUN: 44 mg/dL — ABNORMAL HIGH (ref 8–23)
CO2: 27 mmol/L (ref 22–32)
Calcium: 8.6 mg/dL — ABNORMAL LOW (ref 8.9–10.3)
Chloride: 111 mmol/L (ref 98–111)
Creatinine, Ser: 0.87 mg/dL (ref 0.61–1.24)
GFR, Estimated: >60 mL/min
Glucose, Bld: 150 mg/dL — ABNORMAL HIGH (ref 70–99)
Potassium: 3.1 mmol/L — ABNORMAL LOW (ref 3.5–5.1)
Sodium: 143 mmol/L (ref 135–145)
Total Bilirubin: 0.9 mg/dL (ref 0.3–1.2)
Total Protein: 5.7 g/dL — ABNORMAL LOW (ref 6.5–8.1)

## 2023-06-30 LAB — HEMOGLOBIN A1C
Hgb A1c MFr Bld: 4.1 % — ABNORMAL LOW (ref 4.8–5.6)
Mean Plasma Glucose: 70.97 mg/dL

## 2023-07-01 ENCOUNTER — Emergency Department (HOSPITAL_COMMUNITY): Payer: Medicare Other

## 2023-07-01 ENCOUNTER — Other Ambulatory Visit (HOSPITAL_COMMUNITY): Payer: Medicare Other

## 2023-07-01 ENCOUNTER — Other Ambulatory Visit: Payer: Self-pay

## 2023-07-01 ENCOUNTER — Encounter (HOSPITAL_COMMUNITY): Payer: Self-pay

## 2023-07-01 ENCOUNTER — Inpatient Hospital Stay (HOSPITAL_COMMUNITY)
Admission: EM | Admit: 2023-07-01 | Discharge: 2023-07-13 | DRG: 004 | Disposition: A | Discharge status: Other Acute Inpatient Hospital | Payer: Medicare Other | Source: Other Acute Inpatient Hospital | Attending: Pulmonary Disease | Admitting: Pulmonary Disease

## 2023-07-01 DIAGNOSIS — R339 Retention of urine, unspecified: Secondary | ICD-10-CM | POA: Diagnosis present

## 2023-07-01 DIAGNOSIS — F05 Delirium due to known physiological condition: Secondary | ICD-10-CM | POA: Diagnosis not present

## 2023-07-01 DIAGNOSIS — Z9911 Dependence on respirator [ventilator] status: Secondary | ICD-10-CM | POA: Diagnosis not present

## 2023-07-01 DIAGNOSIS — G9340 Encephalopathy, unspecified: Secondary | ICD-10-CM | POA: Diagnosis not present

## 2023-07-01 DIAGNOSIS — I2699 Other pulmonary embolism without acute cor pulmonale: Secondary | ICD-10-CM

## 2023-07-01 DIAGNOSIS — I10 Essential (primary) hypertension: Secondary | ICD-10-CM | POA: Diagnosis present

## 2023-07-01 DIAGNOSIS — I480 Paroxysmal atrial fibrillation: Secondary | ICD-10-CM | POA: Diagnosis present

## 2023-07-01 DIAGNOSIS — E1165 Type 2 diabetes mellitus with hyperglycemia: Secondary | ICD-10-CM | POA: Diagnosis present

## 2023-07-01 DIAGNOSIS — I82621 Acute embolism and thrombosis of deep veins of right upper extremity: Secondary | ICD-10-CM | POA: Diagnosis present

## 2023-07-01 DIAGNOSIS — J15212 Pneumonia due to Methicillin resistant Staphylococcus aureus: Secondary | ICD-10-CM | POA: Diagnosis present

## 2023-07-01 DIAGNOSIS — Z931 Gastrostomy status: Secondary | ICD-10-CM | POA: Diagnosis not present

## 2023-07-01 DIAGNOSIS — F419 Anxiety disorder, unspecified: Secondary | ICD-10-CM | POA: Diagnosis present

## 2023-07-01 DIAGNOSIS — J9690 Respiratory failure, unspecified, unspecified whether with hypoxia or hypercapnia: Secondary | ICD-10-CM | POA: Diagnosis present

## 2023-07-01 DIAGNOSIS — Z6825 Body mass index (BMI) 25.0-25.9, adult: Secondary | ICD-10-CM

## 2023-07-01 DIAGNOSIS — K631 Perforation of intestine (nontraumatic): Secondary | ICD-10-CM

## 2023-07-01 DIAGNOSIS — E440 Moderate protein-calorie malnutrition: Secondary | ICD-10-CM | POA: Diagnosis present

## 2023-07-01 DIAGNOSIS — Z860100 Personal history of colon polyps, unspecified: Secondary | ICD-10-CM

## 2023-07-01 DIAGNOSIS — J9601 Acute respiratory failure with hypoxia: Principal | ICD-10-CM

## 2023-07-01 DIAGNOSIS — R338 Other retention of urine: Secondary | ICD-10-CM | POA: Diagnosis present

## 2023-07-01 DIAGNOSIS — Z85038 Personal history of other malignant neoplasm of large intestine: Secondary | ICD-10-CM

## 2023-07-01 DIAGNOSIS — J80 Acute respiratory distress syndrome: Secondary | ICD-10-CM | POA: Diagnosis present

## 2023-07-01 DIAGNOSIS — D6489 Other specified anemias: Secondary | ICD-10-CM | POA: Diagnosis present

## 2023-07-01 DIAGNOSIS — J9621 Acute and chronic respiratory failure with hypoxia: Secondary | ICD-10-CM | POA: Diagnosis not present

## 2023-07-01 DIAGNOSIS — Z86718 Personal history of other venous thrombosis and embolism: Secondary | ICD-10-CM

## 2023-07-01 DIAGNOSIS — Z7901 Long term (current) use of anticoagulants: Secondary | ICD-10-CM

## 2023-07-01 DIAGNOSIS — N401 Enlarged prostate with lower urinary tract symptoms: Secondary | ICD-10-CM | POA: Diagnosis present

## 2023-07-01 DIAGNOSIS — R579 Shock, unspecified: Secondary | ICD-10-CM | POA: Diagnosis not present

## 2023-07-01 DIAGNOSIS — D696 Thrombocytopenia, unspecified: Secondary | ICD-10-CM | POA: Diagnosis present

## 2023-07-01 DIAGNOSIS — Z8701 Personal history of pneumonia (recurrent): Secondary | ICD-10-CM

## 2023-07-01 DIAGNOSIS — I080 Rheumatic disorders of both mitral and aortic valves: Secondary | ICD-10-CM | POA: Diagnosis present

## 2023-07-01 DIAGNOSIS — Z79899 Other long term (current) drug therapy: Secondary | ICD-10-CM

## 2023-07-01 DIAGNOSIS — K668 Other specified disorders of peritoneum: Secondary | ICD-10-CM | POA: Diagnosis not present

## 2023-07-01 DIAGNOSIS — L89153 Pressure ulcer of sacral region, stage 3: Secondary | ICD-10-CM | POA: Diagnosis present

## 2023-07-01 DIAGNOSIS — J9622 Acute and chronic respiratory failure with hypercapnia: Secondary | ICD-10-CM

## 2023-07-01 DIAGNOSIS — J810 Acute pulmonary edema: Secondary | ICD-10-CM | POA: Diagnosis present

## 2023-07-01 DIAGNOSIS — E876 Hypokalemia: Secondary | ICD-10-CM | POA: Diagnosis present

## 2023-07-01 DIAGNOSIS — L89892 Pressure ulcer of other site, stage 2: Secondary | ICD-10-CM | POA: Diagnosis present

## 2023-07-01 DIAGNOSIS — J189 Pneumonia, unspecified organism: Secondary | ICD-10-CM | POA: Diagnosis not present

## 2023-07-01 DIAGNOSIS — L8990 Pressure ulcer of unspecified site, unspecified stage: Secondary | ICD-10-CM | POA: Insufficient documentation

## 2023-07-01 DIAGNOSIS — T45515A Adverse effect of anticoagulants, initial encounter: Secondary | ICD-10-CM | POA: Diagnosis not present

## 2023-07-01 DIAGNOSIS — E871 Hypo-osmolality and hyponatremia: Secondary | ICD-10-CM | POA: Diagnosis present

## 2023-07-01 DIAGNOSIS — Z9049 Acquired absence of other specified parts of digestive tract: Secondary | ICD-10-CM

## 2023-07-01 DIAGNOSIS — R042 Hemoptysis: Secondary | ICD-10-CM | POA: Diagnosis not present

## 2023-07-01 DIAGNOSIS — Z794 Long term (current) use of insulin: Secondary | ICD-10-CM | POA: Diagnosis not present

## 2023-07-01 DIAGNOSIS — D6832 Hemorrhagic disorder due to extrinsic circulating anticoagulants: Secondary | ICD-10-CM | POA: Diagnosis not present

## 2023-07-01 DIAGNOSIS — Z7951 Long term (current) use of inhaled steroids: Secondary | ICD-10-CM

## 2023-07-01 DIAGNOSIS — G9341 Metabolic encephalopathy: Secondary | ICD-10-CM | POA: Diagnosis not present

## 2023-07-01 DIAGNOSIS — Z7982 Long term (current) use of aspirin: Secondary | ICD-10-CM

## 2023-07-01 DIAGNOSIS — R569 Unspecified convulsions: Secondary | ICD-10-CM | POA: Diagnosis not present

## 2023-07-01 DIAGNOSIS — Z885 Allergy status to narcotic agent status: Secondary | ICD-10-CM

## 2023-07-01 DIAGNOSIS — J1520 Pneumonia due to staphylococcus, unspecified: Secondary | ICD-10-CM

## 2023-07-01 DIAGNOSIS — Z930 Tracheostomy status: Secondary | ICD-10-CM | POA: Diagnosis not present

## 2023-07-01 DIAGNOSIS — I5031 Acute diastolic (congestive) heart failure: Secondary | ICD-10-CM | POA: Diagnosis not present

## 2023-07-01 DIAGNOSIS — J9602 Acute respiratory failure with hypercapnia: Secondary | ICD-10-CM | POA: Diagnosis not present

## 2023-07-01 LAB — COMPREHENSIVE METABOLIC PANEL
ALT: 38 U/L (ref 0–44)
ALT: 37 U/L (ref 0–44)
AST: 48 U/L — ABNORMAL HIGH (ref 15–41)
AST: 49 U/L — ABNORMAL HIGH (ref 15–41)
Albumin: 2.5 g/dL — ABNORMAL LOW (ref 3.5–5.0)
Albumin: 2.4 g/dL — ABNORMAL LOW (ref 3.5–5.0)
Alkaline Phosphatase: 72 U/L (ref 38–126)
Alkaline Phosphatase: 74 U/L (ref 38–126)
Anion gap: 12 (ref 5–15)
Anion gap: 12 (ref 5–15)
BUN: 39 mg/dL — ABNORMAL HIGH (ref 8–23)
BUN: 41 mg/dL — ABNORMAL HIGH (ref 8–23)
CO2: 27 mmol/L (ref 22–32)
CO2: 22 mmol/L (ref 22–32)
Calcium: 9.3 mg/dL (ref 8.9–10.3)
Calcium: 8.8 mg/dL — ABNORMAL LOW (ref 8.9–10.3)
Chloride: 108 mmol/L (ref 98–111)
Chloride: 111 mmol/L (ref 98–111)
Creatinine, Ser: 1.06 mg/dL (ref 0.61–1.24)
Creatinine, Ser: 1 mg/dL (ref 0.61–1.24)
GFR, Estimated: >60 mL/min (ref >60)
GFR, Estimated: >60 mL/min (ref >60)
Glucose, Bld: 183 mg/dL — ABNORMAL HIGH (ref 70–99)
Glucose, Bld: 188 mg/dL — ABNORMAL HIGH (ref 70–99)
Potassium: 4.5 mmol/L (ref 3.5–5.1)
Potassium: 4.5 mmol/L (ref 3.5–5.1)
Sodium: 147 mmol/L — ABNORMAL HIGH (ref 135–145)
Sodium: 145 mmol/L (ref 135–145)
Total Bilirubin: 1.8 mg/dL — ABNORMAL HIGH (ref 0.3–1.2)
Total Bilirubin: 1.7 mg/dL — ABNORMAL HIGH (ref 0.3–1.2)
Total Protein: 6.1 g/dL — ABNORMAL LOW (ref 6.5–8.1)
Total Protein: 6.2 g/dL — ABNORMAL LOW (ref 6.5–8.1)

## 2023-07-01 LAB — URINALYSIS, ROUTINE W REFLEX MICROSCOPIC
Bilirubin Urine: NEGATIVE
Glucose, UA: NEGATIVE mg/dL
Ketones, ur: NEGATIVE mg/dL
Leukocytes,Ua: NEGATIVE
Nitrite: NEGATIVE
Protein, ur: NEGATIVE mg/dL
Specific Gravity, Urine: 1.016 (ref 1.005–1.030)
pH: 5 (ref 5.0–8.0)

## 2023-07-01 LAB — I-STAT ARTERIAL BLOOD GAS, ED
Acid-Base Excess: 2 mmol/L (ref 0.0–2.0)
Acid-base deficit: 2 mmol/L (ref 0.0–2.0)
Bicarbonate: 25.5 mmol/L (ref 20.0–28.0)
Bicarbonate: 25.7 mmol/L (ref 20.0–28.0)
Calcium, Ion: 1.24 mmol/L (ref 1.15–1.40)
Calcium, Ion: 1.28 mmol/L (ref 1.15–1.40)
HCT: 29 % — ABNORMAL LOW (ref 39.0–52.0)
HCT: 30 % — ABNORMAL LOW (ref 39.0–52.0)
Hemoglobin: 9.9 g/dL — ABNORMAL LOW (ref 13.0–17.0)
Hemoglobin: 10.2 g/dL — ABNORMAL LOW (ref 13.0–17.0)
O2 Saturation: 92 %
O2 Saturation: 89 %
Patient temperature: 98.6
Potassium: 4.6 mmol/L (ref 3.5–5.1)
Potassium: 4.4 mmol/L (ref 3.5–5.1)
Sodium: 146 mmol/L — ABNORMAL HIGH (ref 135–145)
Sodium: 147 mmol/L — ABNORMAL HIGH (ref 135–145)
TCO2: 26 mmol/L (ref 22–32)
TCO2: 27 mmol/L (ref 22–32)
pCO2 arterial: 34.5 mm[Hg] (ref 32–48)
pCO2 arterial: 57.4 mm[Hg] — ABNORMAL HIGH (ref 32–48)
pH, Arterial: 7.475 — ABNORMAL HIGH (ref 7.35–7.45)
pH, Arterial: 7.259 — ABNORMAL LOW (ref 7.35–7.45)
pO2, Arterial: 59 mm[Hg] — ABNORMAL LOW (ref 83–108)
pO2, Arterial: 67 mm[Hg] — ABNORMAL LOW (ref 83–108)

## 2023-07-01 LAB — CBC WITH DIFFERENTIAL/PLATELET
Abs Immature Granulocytes: 0.11 10*3/uL — ABNORMAL HIGH (ref 0.00–0.07)
Abs Immature Granulocytes: 0.27 10*3/uL — ABNORMAL HIGH (ref 0.00–0.07)
Basophils Absolute: 0 10*3/uL (ref 0.0–0.1)
Basophils Absolute: 0 10*3/uL (ref 0.0–0.1)
Basophils Relative: 0 %
Basophils Relative: 0 %
Eosinophils Absolute: 0.2 10*3/uL (ref 0.0–0.5)
Eosinophils Absolute: 0 10*3/uL (ref 0.0–0.5)
Eosinophils Relative: 1 %
Eosinophils Relative: 0 %
HCT: 37.2 % — ABNORMAL LOW (ref 39.0–52.0)
HCT: 31.1 % — ABNORMAL LOW (ref 39.0–52.0)
Hemoglobin: 12 g/dL — ABNORMAL LOW (ref 13.0–17.0)
Hemoglobin: 9.9 g/dL — ABNORMAL LOW (ref 13.0–17.0)
Immature Granulocytes: 1 %
Immature Granulocytes: 1 %
Lymphocytes Relative: 13 %
Lymphocytes Relative: 6 %
Lymphs Abs: 2.3 10*3/uL (ref 0.7–4.0)
Lymphs Abs: 1.2 10*3/uL (ref 0.7–4.0)
MCH: 32.9 pg (ref 26.0–34.0)
MCH: 32.2 pg (ref 26.0–34.0)
MCHC: 32.3 g/dL (ref 30.0–36.0)
MCHC: 31.8 g/dL (ref 30.0–36.0)
MCV: 101.9 fL — ABNORMAL HIGH (ref 80.0–100.0)
MCV: 101.3 fL — ABNORMAL HIGH (ref 80.0–100.0)
Monocytes Absolute: 0.9 10*3/uL (ref 0.1–1.0)
Monocytes Absolute: 0.6 10*3/uL (ref 0.1–1.0)
Monocytes Relative: 5 %
Monocytes Relative: 3 %
Neutro Abs: 13.8 10*3/uL — ABNORMAL HIGH (ref 1.7–7.7)
Neutro Abs: 19.6 10*3/uL — ABNORMAL HIGH (ref 1.7–7.7)
Neutrophils Relative %: 80 %
Neutrophils Relative %: 90 %
Platelets: 143 10*3/uL — ABNORMAL LOW (ref 150–400)
Platelets: 132 10*3/uL — ABNORMAL LOW (ref 150–400)
RBC: 3.65 MIL/uL — ABNORMAL LOW (ref 4.22–5.81)
RBC: 3.07 MIL/uL — ABNORMAL LOW (ref 4.22–5.81)
RDW: 16.8 % — ABNORMAL HIGH (ref 11.5–15.5)
RDW: 16.8 % — ABNORMAL HIGH (ref 11.5–15.5)
WBC: 17.3 10*3/uL — ABNORMAL HIGH (ref 4.0–10.5)
WBC: 21.8 10*3/uL — ABNORMAL HIGH (ref 4.0–10.5)
nRBC: 0.1 % (ref 0.0–0.2)
nRBC: 0 % (ref 0.0–0.2)

## 2023-07-01 LAB — BLOOD GAS, ARTERIAL
Acid-Base Excess: 2.8 mmol/L — ABNORMAL HIGH (ref 0.0–2.0)
Bicarbonate: 26.1 mmol/L (ref 20.0–28.0)
O2 Saturation: 98 %
Patient temperature: 36.9
pCO2 arterial: 35 mm[Hg] (ref 32–48)
pH, Arterial: 7.48 — ABNORMAL HIGH (ref 7.35–7.45)
pO2, Arterial: 74 mm[Hg] — ABNORMAL LOW (ref 83–108)

## 2023-07-01 LAB — BRAIN NATRIURETIC PEPTIDE: B Natriuretic Peptide: 443.5 pg/mL — ABNORMAL HIGH (ref 0.0–100.0)

## 2023-07-01 LAB — D-DIMER, QUANTITATIVE: D-Dimer, Quant: 1.7 ug{FEU}/mL — ABNORMAL HIGH (ref 0.00–0.50)

## 2023-07-01 LAB — LIPASE, BLOOD: Lipase: 26 U/L (ref 11–51)

## 2023-07-01 LAB — APTT: aPTT: 35 s (ref 24–36)

## 2023-07-01 LAB — PROTIME-INR
INR: 1.9 — ABNORMAL HIGH (ref 0.8–1.2)
Prothrombin Time: 21.7 s — ABNORMAL HIGH (ref 11.4–15.2)

## 2023-07-01 LAB — I-STAT CG4 LACTIC ACID, ED: Lactic Acid, Venous: 1.9 mmol/L (ref 0.5–1.9)

## 2023-07-01 LAB — POC OCCULT BLOOD, ED: Fecal Occult Bld: POSITIVE — AB

## 2023-07-01 MED ORDER — DOCUSATE SODIUM 100 MG PO CAPS: 100.0000 mg | ORAL_CAPSULE | Freq: Two times a day (BID) | ORAL | Status: DC | PRN

## 2023-07-01 MED ORDER — PROPOFOL 1000 MG/100ML IV EMUL
0.0000 ug/kg/min | INTRAVENOUS | Status: DC
  Administered 2023-07-01: 10 ug/kg/min via INTRAVENOUS
  Administered 2023-07-02: 20 ug/kg/min via INTRAVENOUS
  Administered 2023-07-02 – 2023-07-03 (×2): 40 ug/kg/min via INTRAVENOUS
  Administered 2023-07-03: 50 ug/kg/min via INTRAVENOUS
  Administered 2023-07-03: 40 ug/kg/min via INTRAVENOUS
  Administered 2023-07-03: 50 ug/kg/min via INTRAVENOUS
  Administered 2023-07-03: 30 ug/kg/min via INTRAVENOUS
  Administered 2023-07-04 – 2023-07-05 (×4): 20 ug/kg/min via INTRAVENOUS
  Administered 2023-07-05: 30 ug/kg/min via INTRAVENOUS
  Administered 2023-07-06: 10 ug/kg/min via INTRAVENOUS
  Administered 2023-07-06: 20 ug/kg/min via INTRAVENOUS
  Administered 2023-07-07: 10 ug/kg/min via INTRAVENOUS
  Filled 2023-07-01 (×3): qty 100
  Filled 2023-07-01: qty 200
  Filled 2023-07-01: qty 100
  Filled 2023-07-01: qty 200
  Filled 2023-07-01 (×8): qty 100

## 2023-07-01 MED ORDER — INSULIN ASPART 100 UNIT/ML IJ SOLN: 2.0000 [IU] | INTRAMUSCULAR | Status: DC

## 2023-07-01 MED ORDER — IOHEXOL 350 MG/ML SOLN
75.0000 mL | Freq: Once | INTRAVENOUS | Status: AC | PRN
  Administered 2023-07-01: 75 mL via INTRAVENOUS

## 2023-07-01 MED ORDER — PROPOFOL 1000 MG/100ML IV EMUL
INTRAVENOUS | Status: AC
  Filled 2023-07-01: qty 100

## 2023-07-01 MED ORDER — IPRATROPIUM-ALBUTEROL 0.5-2.5 (3) MG/3ML IN SOLN: 3.0000 mL | Freq: Four times a day (QID) | RESPIRATORY_TRACT | Status: DC

## 2023-07-01 MED ORDER — ROCURONIUM BROMIDE 50 MG/5ML IV SOLN
INTRAVENOUS | Status: DC | PRN
  Administered 2023-07-01: 100 mg via INTRAVENOUS

## 2023-07-01 MED ORDER — CHLORHEXIDINE GLUCONATE CLOTH 2 % EX PADS
6.0000 | MEDICATED_PAD | Freq: Every day | CUTANEOUS | Status: DC
  Administered 2023-07-01 – 2023-07-02 (×2): 6 via TOPICAL

## 2023-07-01 MED ORDER — PIPERACILLIN-TAZOBACTAM 3.375 G IVPB
3.3750 g | Freq: Three times a day (TID) | INTRAVENOUS | Status: DC
  Administered 2023-07-02 – 2023-07-05 (×11): 3.375 g via INTRAVENOUS
  Filled 2023-07-01 (×13): qty 50

## 2023-07-01 MED ORDER — POLYETHYLENE GLYCOL 3350 17 G PO PACK: 17.0000 g | PACK | Freq: Every day | ORAL | Status: DC | PRN

## 2023-07-01 MED ORDER — ETOMIDATE 2 MG/ML IV SOLN
INTRAVENOUS | Status: DC | PRN
  Administered 2023-07-01: 20 mg via INTRAVENOUS

## 2023-07-01 MED ORDER — IOHEXOL 9 MG/ML PO SOLN
500.0000 mL | ORAL | Status: AC
  Administered 2023-07-01: 500 mL via ORAL

## 2023-07-01 MED ORDER — MEDIHONEY WOUND/BURN DRESSING EX PSTE
1.0000 | PASTE | Freq: Every day | CUTANEOUS | Status: DC
  Administered 2023-07-02 – 2023-07-13 (×12): 1 via TOPICAL
  Filled 2023-07-01: qty 44

## 2023-07-01 MED ORDER — IPRATROPIUM-ALBUTEROL 0.5-2.5 (3) MG/3ML IN SOLN
3.0000 mL | Freq: Four times a day (QID) | RESPIRATORY_TRACT | Status: DC
  Administered 2023-07-02 – 2023-07-03 (×6): 3 mL via RESPIRATORY_TRACT
  Filled 2023-07-01 (×4): qty 3

## 2023-07-01 MED ORDER — PANTOPRAZOLE SODIUM 40 MG IV SOLR
40.0000 mg | INTRAVENOUS | Status: DC
  Administered 2023-07-02 – 2023-07-03 (×3): 40 mg via INTRAVENOUS
  Filled 2023-07-01 (×3): qty 10

## 2023-07-01 MED ORDER — BUDESONIDE 0.5 MG/2ML IN SUSP
0.5000 mg | Freq: Two times a day (BID) | RESPIRATORY_TRACT | Status: DC
Start: 2023-07-01 — End: 2023-07-09
  Administered 2023-07-02 – 2023-07-09 (×15): 0.5 mg via RESPIRATORY_TRACT
  Filled 2023-07-01 (×14): qty 2

## 2023-07-01 MED ORDER — INSULIN ASPART 100 UNIT/ML IJ SOLN
0.0000 [IU] | INTRAMUSCULAR | Status: DC
Start: 2023-07-02 — End: 2023-07-13
  Administered 2023-07-02 – 2023-07-05 (×16): 2 [IU] via SUBCUTANEOUS
  Administered 2023-07-05: 3 [IU] via SUBCUTANEOUS
  Administered 2023-07-06: 2 [IU] via SUBCUTANEOUS
  Administered 2023-07-06 (×4): 3 [IU] via SUBCUTANEOUS
  Administered 2023-07-07 (×5): 2 [IU] via SUBCUTANEOUS
  Administered 2023-07-07 – 2023-07-08 (×5): 3 [IU] via SUBCUTANEOUS
  Administered 2023-07-08: 2 [IU] via SUBCUTANEOUS
  Administered 2023-07-08 – 2023-07-09 (×2): 3 [IU] via SUBCUTANEOUS
  Administered 2023-07-09: 2 [IU] via SUBCUTANEOUS
  Administered 2023-07-09 (×3): 3 [IU] via SUBCUTANEOUS
  Administered 2023-07-10: 5 [IU] via SUBCUTANEOUS
  Administered 2023-07-10: 2 [IU] via SUBCUTANEOUS
  Administered 2023-07-10 – 2023-07-11 (×6): 3 [IU] via SUBCUTANEOUS
  Administered 2023-07-11 (×2): 2 [IU] via SUBCUTANEOUS
  Administered 2023-07-11: 3 [IU] via SUBCUTANEOUS
  Administered 2023-07-12: 2 [IU] via SUBCUTANEOUS
  Administered 2023-07-12: 3 [IU] via SUBCUTANEOUS
  Administered 2023-07-12: 2 [IU] via SUBCUTANEOUS
  Administered 2023-07-12: 3 [IU] via SUBCUTANEOUS
  Administered 2023-07-12: 2 [IU] via SUBCUTANEOUS
  Administered 2023-07-12: 3 [IU] via SUBCUTANEOUS
  Administered 2023-07-13 (×2): 2 [IU] via SUBCUTANEOUS
  Administered 2023-07-13: 3 [IU] via SUBCUTANEOUS

## 2023-07-01 MED ORDER — FAMOTIDINE 20 MG PO TABS: 20.0000 mg | ORAL_TABLET | Freq: Two times a day (BID) | ORAL | Status: DC

## 2023-07-01 MED ORDER — GERHARDT'S BUTT CREAM
1.0000 | TOPICAL_CREAM | Freq: Two times a day (BID) | CUTANEOUS | Status: DC
Start: 2023-07-01 — End: 2023-07-13
  Administered 2023-07-02 – 2023-07-13 (×24): 1 via TOPICAL
  Filled 2023-07-01: qty 1

## 2023-07-01 MED ORDER — POLYETHYLENE GLYCOL 3350 17 G PO PACK
17.0000 g | PACK | Freq: Every day | ORAL | Status: DC
  Administered 2023-07-02 – 2023-07-06 (×3): 17 g
  Filled 2023-07-01 (×5): qty 1

## 2023-07-01 MED ORDER — FENTANYL 2500MCG IN NS 250ML (10MCG/ML) PREMIX INFUSION
0.0000 ug/h | INTRAVENOUS | Status: DC
  Administered 2023-07-01: 25 ug/h via INTRAVENOUS
  Administered 2023-07-02: 50 ug/h via INTRAVENOUS
  Administered 2023-07-02 – 2023-07-03 (×2): 200 ug/h via INTRAVENOUS
  Administered 2023-07-04 – 2023-07-06 (×2): 25 ug/h via INTRAVENOUS
  Administered 2023-07-07: 100 ug/h via INTRAVENOUS
  Filled 2023-07-01 (×6): qty 250

## 2023-07-01 MED ORDER — LORAZEPAM 2 MG/ML IJ SOLN
0.5000 mg | Freq: Once | INTRAMUSCULAR | Status: AC
  Administered 2023-07-01: 0.5 mg via INTRAVENOUS
  Filled 2023-07-01: qty 1

## 2023-07-01 MED ORDER — PIPERACILLIN-TAZOBACTAM 3.375 G IVPB 30 MIN: 3.3750 g | Freq: Once | INTRAVENOUS | Status: DC

## 2023-07-01 MED ORDER — FENTANYL BOLUS VIA INFUSION
25.0000 ug | INTRAVENOUS | Status: DC | PRN
Start: 2023-07-01 — End: 2023-07-08
  Administered 2023-07-02: 50 ug via INTRAVENOUS
  Administered 2023-07-02 (×2): 100 ug via INTRAVENOUS
  Administered 2023-07-02: 50 ug via INTRAVENOUS
  Administered 2023-07-02 (×2): 100 ug via INTRAVENOUS
  Administered 2023-07-02: 50 ug via INTRAVENOUS
  Administered 2023-07-02: 100 ug via INTRAVENOUS
  Administered 2023-07-02: 50 ug via INTRAVENOUS
  Administered 2023-07-02 – 2023-07-03 (×2): 100 ug via INTRAVENOUS
  Administered 2023-07-04 – 2023-07-05 (×6): 50 ug via INTRAVENOUS
  Administered 2023-07-06: 75 ug via INTRAVENOUS
  Administered 2023-07-06: 100 ug via INTRAVENOUS
  Administered 2023-07-06: 75 ug via INTRAVENOUS
  Administered 2023-07-07 (×4): 100 ug via INTRAVENOUS

## 2023-07-01 MED ORDER — AMIODARONE HCL 200 MG PO TABS
200.0000 mg | ORAL_TABLET | Freq: Every day | ORAL | Status: DC
  Administered 2023-07-02 – 2023-07-13 (×12): 200 mg
  Filled 2023-07-01 (×12): qty 1

## 2023-07-01 MED ORDER — DROPERIDOL 2.5 MG/ML IJ SOLN
1.2500 mg | Freq: Once | INTRAMUSCULAR | Status: AC
  Administered 2023-07-01: 1.25 mg via INTRAVENOUS
  Filled 2023-07-01: qty 2

## 2023-07-01 MED ORDER — DOCUSATE SODIUM 50 MG/5ML PO LIQD
100.0000 mg | Freq: Two times a day (BID) | ORAL | Status: DC
  Administered 2023-07-02 – 2023-07-07 (×6): 100 mg
  Filled 2023-07-01 (×9): qty 10

## 2023-07-01 NOTE — Progress Notes (Signed)
Pharmacy Antibiotic Note  Joshua Eaton is a 67 y.o. male admitted on 07/01/2023 with  intra-abdominal infection .  Pharmacy has been consulted for zosyn dosing.  WBC 21.8, Tm 98.3 afeb RR 32, BP 157/63, HR 105 Presented on BIPAP, required intubation Scr 1 (~BL)   Plan: Start Zosyn 3.375g IV every 8 hours (4 hour infusion). Monitor CBC, renal function, signs of clinical improvement, cultures Deescalate as able     Temp (24hrs), Avg:98.3 F (36.8 C), Min:98.3 F (36.8 C), Max:98.3 F (36.8 C)  Recent Labs  Lab 06/27/23 0747 06/28/23 0353 06/29/23 0526 06/30/23 0458 07/01/23 0500 07/01/23 1330 07/01/23 1817 07/01/23 1825  WBC 10.1  --  10.2  --  17.3*  --  21.8*  --   CREATININE 1.01 0.98 0.94 0.87  --  1.06 1.00  --   LATICACIDVEN  --   --   --   --   --   --   --  1.9    Estimated Creatinine Clearance: 78.8 mL/min (by C-G formula based on SCr of 1 mg/dL).    Allergies  Allergen Reactions   Codeine     "sends me into orbit"    Antimicrobials this admission: 10/31 zosyn  >>   Microbiology results: 10/31 BCx: sent   Thank you for allowing pharmacy to be a part of this patient's care.  Stephenie Acres, PharmD PGY1 Pharmacy Resident 07/01/2023 11:12 PM

## 2023-07-01 NOTE — Consult Note (Addendum)
Reason for Consult/Chief Complaint: Concern for colonic injury Consultant: Donata Duff, MD  Joshua Eaton is an 67 y.o. male.   HPI: Patient is a 67yo male with history of respiratory failure and tracheostomy with recent admission 10/3-10/28 at [facility] after a previous 2 weeks admission in September for pneumonia complicated by ARDS and acute cardiogenic pulmonary edema. Patient has severe MR and not a candidate for valve replacement due to overall frailty. He underwent tracheostomy (10/18) and PEG (10/24) during his last admission and was discharged to [facility]. He did dislodge his trach at [facility] was but was saturating fine and trach kept out. He was brought in to ED today due to increased work of breathing and ultimately failed BiPAP and required intubation.  CT scan was obtained after intubation and there was concern for possible sequela of colonic injury from recent PEG. Patient is intubated but abdominal exam remains unremarkable. Soft and nontender. It is unclear whether there is a small injury that has sealed itself off or if there is a persistent perforation present on imaging. I evaluated the patient and discussed with family at bedside. Given his high risk for an operation and imaging not being definitive for whether an injury is persistent or sealed or contained, we will scan with enteral contrast to better evaluate.  Past Medical History:  Diagnosis Date   Anxiety    Cancer (HCC)    Depression    Hypertension    OCD (obsessive compulsive disorder)     Past Surgical History:  Procedure Laterality Date   CHOLECYSTECTOMY     COLON SURGERY     cancerous polyp removed   HERNIA REPAIR     IR GASTROSTOMY TUBE MOD SED  06/24/2023    History reviewed. No pertinent family history.  Social History:  reports that he has never smoked. He has never used smokeless tobacco. He reports that he does not drink alcohol and does not use drugs.  Allergies:  Allergies  Allergen Reactions    Codeine     "sends me into orbit"    Medications: I have reviewed the patient's current medications.  Results for orders placed or performed during the hospital encounter of 07/01/23 (from the past 48 hour(s))  I-Stat arterial blood gas, ED     Status: Abnormal   Collection Time: 07/01/23  6:12 PM  Result Value Ref Range   pH, Arterial 7.475 (H) 7.35 - 7.45   pCO2 arterial 34.5 32 - 48 mmHg   pO2, Arterial 59 (L) 83 - 108 mmHg   Bicarbonate 25.5 20.0 - 28.0 mmol/L   TCO2 26 22 - 32 mmol/L   O2 Saturation 92 %   Acid-Base Excess 2.0 0.0 - 2.0 mmol/L   Sodium 146 (H) 135 - 145 mmol/L   Potassium 4.6 3.5 - 5.1 mmol/L   Calcium, Ion 1.24 1.15 - 1.40 mmol/L   HCT 29.0 (L) 39.0 - 52.0 %   Hemoglobin 9.9 (L) 13.0 - 17.0 g/dL   Collection site RADIAL, ALLEN'S TEST ACCEPTABLE    Drawn by RT    Sample type ARTERIAL   Urinalysis, Routine w reflex microscopic -Urine, Clean Catch     Status: Abnormal   Collection Time: 07/01/23  6:16 PM  Result Value Ref Range   Color, Urine YELLOW YELLOW   APPearance HAZY (A) CLEAR   Specific Gravity, Urine 1.016 1.005 - 1.030   pH 5.0 5.0 - 8.0   Glucose, UA NEGATIVE NEGATIVE mg/dL   Hgb urine dipstick MODERATE (  A) NEGATIVE   Bilirubin Urine NEGATIVE NEGATIVE   Ketones, ur NEGATIVE NEGATIVE mg/dL   Protein, ur NEGATIVE NEGATIVE mg/dL   Nitrite NEGATIVE NEGATIVE   Leukocytes,Ua NEGATIVE NEGATIVE   RBC / HPF 21-50 0 - 5 RBC/hpf   WBC, UA 0-5 0 - 5 WBC/hpf   Bacteria, UA RARE (A) NONE SEEN   Squamous Epithelial / HPF 0-5 0 - 5 /HPF   Mucus PRESENT    Hyaline Casts, UA PRESENT     Comment: Performed at [facility], [address], [place], [place] [address]  CBC with Differential     Status: Abnormal   Collection Time: 07/01/23  6:17 PM  Result Value Ref Range   WBC 21.8 (H) 4.0 - 10.5 K/uL   RBC 3.07 (L) 4.22 - 5.81 MIL/uL   Hemoglobin 9.9 (L) 13.0 - 17.0 g/dL   HCT 29.5 (L) 28.4 - 13.2 %   MCV 101.3 (H) 80.0 - 100.0 fL   MCH 32.2 26.0  - 34.0 pg   MCHC 31.8 30.0 - 36.0 g/dL   RDW 44.0 (H) 10.2 - 72.5 %   Platelets 132 (L) 150 - 400 K/uL   nRBC 0.0 0.0 - 0.2 %   Neutrophils Relative % 90 %   Neutro Abs 19.6 (H) 1.7 - 7.7 K/uL   Lymphocytes Relative 6 %   Lymphs Abs 1.2 0.7 - 4.0 K/uL   Monocytes Relative 3 %   Monocytes Absolute 0.6 0.1 - 1.0 K/uL   Eosinophils Relative 0 %   Eosinophils Absolute 0.0 0.0 - 0.5 K/uL   Basophils Relative 0 %   Basophils Absolute 0.0 0.0 - 0.1 K/uL   Immature Granulocytes 1 %   Abs Immature Granulocytes 0.27 (H) 0.00 - 0.07 K/uL    Comment: Performed at [facility], [address], [place], [place] [address]  Comprehensive metabolic panel     Status: Abnormal   Collection Time: 07/01/23  6:17 PM  Result Value Ref Range   Sodium 145 135 - 145 mmol/L   Potassium 4.5 3.5 - 5.1 mmol/L   Chloride 111 98 - 111 mmol/L   CO2 22 22 - 32 mmol/L   Glucose, Bld 188 (H) 70 - 99 mg/dL    Comment: Glucose reference range applies only to samples taken after fasting for at least 8 hours.   BUN 41 (H) 8 - 23 mg/dL   Creatinine, Ser 0.34 0.61 - 1.24 mg/dL   Calcium 8.8 (L) 8.9 - 10.3 mg/dL   Total Protein 6.2 (L) 6.5 - 8.1 g/dL   Albumin 2.4 (L) 3.5 - 5.0 g/dL   AST 49 (H) 15 - 41 U/L   ALT 37 0 - 44 U/L   Alkaline Phosphatase 74 38 - 126 U/L   Total Bilirubin 1.7 (H) 0.3 - 1.2 mg/dL   GFR, Estimated >74 >25 mL/min    Comment: (NOTE) Calculated using the CKD-EPI Creatinine Equation (2021)    Anion gap 12 5 - 15    Comment: Performed at [facility], [address] [place], [place] [address]  Lipase, blood     Status: None   Collection Time: 07/01/23  6:17 PM  Result Value Ref Range   Lipase 26 11 - 51 U/L    Comment: Performed at [facility], [address], [place], [place] [address]  I-Stat CG4 Lactic Acid     Status: None   Collection Time: 07/01/23  6:25 PM  Result  Value Ref Range   Lactic Acid, Venous 1.9 0.5 - 1.9 mmol/L  POC occult blood, ED     Status:  Abnormal   Collection Time: 07/01/23  7:38 PM  Result Value Ref Range   Fecal Occult Bld POSITIVE (A) NEGATIVE  I-Stat arterial blood gas, ED     Status: Abnormal   Collection Time: 07/01/23 10:03 PM  Result Value Ref Range   pH, Arterial 7.259 (L) 7.35 - 7.45   pCO2 arterial 57.4 (H) 32 - 48 mmHg   pO2, Arterial 67 (L) 83 - 108 mmHg   Bicarbonate 25.7 20.0 - 28.0 mmol/L   TCO2 27 22 - 32 mmol/L   O2 Saturation 89 %   Acid-base deficit 2.0 0.0 - 2.0 mmol/L   Sodium 147 (H) 135 - 145 mmol/L   Potassium 4.4 3.5 - 5.1 mmol/L   Calcium, Ion 1.28 1.15 - 1.40 mmol/L   HCT 30.0 (L) 39.0 - 52.0 %   Hemoglobin 10.2 (L) 13.0 - 17.0 g/dL   Patient temperature 16.1 F    Collection site RADIAL, ALLEN'S TEST ACCEPTABLE    Drawn by Operator    Sample type ARTERIAL     DG Chest Portable 1 View  Result Date: 07/01/2023 CLINICAL DATA:  Intubation. EXAM: PORTABLE CHEST 1 VIEW COMPARISON:  Chest radiograph dated 07/01/2023. FINDINGS: Interval placement of an endotracheal tube with tip approximately 2.5 cm above the carina. Enteric tube extends below the diaphragm with tip in the epigastric area. Bilateral pulmonary opacities similar or slightly progressed since the earlier radiograph. No large pleural effusion or pneumothorax. Stable cardiomediastinal silhouette. No acute osseous pathology. IMPRESSION: 1. Endotracheal tube with tip approximately 2.5 cm above the carina. 2. Bilateral pulmonary opacities similar or slightly progressed since the earlier radiograph. Electronically Signed   By: Elgie Collard M.D.   On: 07/01/2023 22:43   CT CHEST ABDOMEN PELVIS W CONTRAST  Result Date: 07/01/2023 CLINICAL DATA:  Acute nonlocalized abdominal pain. Respiratory failure. Suspicion of intramural air on abdominal radiograph. EXAM: CT CHEST, ABDOMEN, AND PELVIS WITH CONTRAST TECHNIQUE: Multidetector CT imaging of the chest, abdomen and pelvis was performed following the standard protocol during bolus  administration of intravenous contrast. RADIATION DOSE REDUCTION: This exam was performed according to the departmental dose-optimization program which includes automated exposure control, adjustment of the mA and/or kV according to patient size and/or use of iterative reconstruction technique. CONTRAST:  75mL OMNIPAQUE IOHEXOL 350 MG/ML SOLN COMPARISON:  Chest and abdominal radiograph 07/01/2023. CT abdomen and pelvis 06/21/2023. CT chest 06/15/2023 FINDINGS: CT CHEST FINDINGS Cardiovascular: Normal heart size. No pericardial effusions. Normal caliber thoracic aorta. Calcification of the aorta. Mediastinum/Nodes: Esophagus is decompressed. Thyroid gland is unremarkable. No significant lymphadenopathy. Lungs/Pleura: Diffuse airspace disease throughout the lungs likely representing pulmonary edema although aspiration or severe multifocal pneumonia could also have this appearance. No pleural effusions. No pneumothorax. Musculoskeletal: No chest wall mass or suspicious bone lesions identified. CT ABDOMEN PELVIS FINDINGS Hepatobiliary: No focal liver abnormality is seen. Status post cholecystectomy. No biliary dilatation. Pancreas: Unremarkable. No pancreatic ductal dilatation or surrounding inflammatory changes. Spleen: Normal in size without focal abnormality. Adrenals/Urinary Tract: Adrenal glands are unremarkable. Kidneys are normal, without renal calculi, focal lesion, or hydronephrosis. Foley catheter is in the bladder. Residual contrast material in the renal collecting systems, ureters, and bladder. Stomach/Bowel: Stomach, small bowel, and colon are not abnormally distended. There is a gastrostomy tube positioned along the greater curvature of the stomach. The retention balloon is tenting of the gastric wall  but is likely within the stomach. There is residual contrast material in the colon. There is extraluminal gas tracking along the hepatic flexure of the colon and extending along the descending colon. This is  likely related to recent gastrostomy tube placement and could indicate colonic injury. Ischemia is less likely to have this appearance given the appearance and distribution of the gas. No focal wall thickening is identified. Vascular/Lymphatic: Aortic atherosclerosis. No enlarged abdominal or pelvic lymph nodes. Reproductive: Prostate is unremarkable. Other: No free intraperitoneal air. No free fluid. Postoperative right inguinal mesh hernia repair. Small periumbilical hernia containing fat. Musculoskeletal: No acute or significant osseous findings. IMPRESSION: 1. Percutaneous gastrostomy tube appears in place along the greater curvature of the stomach, tenting of the gastric wall. 2. Extraluminal pericolonic gas along the splenic flexure and descending colon. This appears to be extramural gas rather than pneumatosis. This is likely related to the prior procedure in may indicate colonic injury with extraperitoneal gas along the colon. 3. No free air or free fluid in the intraperitoneal spaces. 4. Diffuse airspace disease throughout both lungs most likely edema but could indicate severe pneumonia, aspiration, or ARDS. These results were called by telephone at the time of interpretation on 07/01/2023 at 9:35 pm to provider [facility] , who verbally acknowledged these results. Electronically Signed   By: Burman Nieves M.D.   On: 07/01/2023 21:45   DG Chest Portable 1 View  Result Date: 07/01/2023 CLINICAL DATA:  Altered mental status. EXAM: PORTABLE CHEST 1 VIEW COMPARISON:  Chest radiograph dated 07/01/2023. FINDINGS: Slight interval improvement in bilateral pulmonary opacities since the earlier radiograph. No pleural effusion or pneumothorax. Stable cardiomediastinal silhouette. No acute osseous pathology. IMPRESSION: Slight interval improvement in bilateral pulmonary opacities. Electronically Signed   By: Elgie Collard M.D.   On: 07/01/2023 21:30   DG Abd 1 View  Result Date: 07/01/2023 CLINICAL  DATA:  Ileus. EXAM: ABDOMEN - 1 VIEW COMPARISON:  One view abdomen 06/28/23 FINDINGS: Gastrostomy tube is stable. Progressive intramural air is present at the splenic flexure and proximal descending colon, concerning for pneumatosis. No proximal obstruction is present. No definite free air is present on this single supine view. IMPRESSION: 1. Progressive intramural air at the splenic flexure and proximal descending colon, concerning for pneumatosis. Recommend CT of the abdomen and pelvis with IV contrast. These results will be called to the ordering clinician or representative by the Radiologist Assistant, and communication documented in the PACS or [employer]. Electronically Signed   By: Marin Roberts M.D.   On: 07/01/2023 11:24   DG CHEST PORT 1 VIEW  Result Date: 07/01/2023 CLINICAL DATA:  Shortness of breath. Aspiration and shortness of breath. EXAM: PORTABLE CHEST 1 VIEW COMPARISON:  One view chest x-ray 06/28/2023. FINDINGS: Tracheostomy tube was removed. Heart is enlarged. Volumes are low. Increasing interstitial and airspace opacities are present bilaterally. IMPRESSION: 1. Increasing interstitial and airspace opacities bilaterally likely reflects edema. 2. Cardiomegaly without failure. Electronically Signed   By: Marin Roberts M.D.   On: 07/01/2023 11:19    ROS 10 point review of systems is negative except as listed above in HPI.   Physical Exam Blood pressure (!) 157/63, pulse (!) 105, temperature 98.3 F (36.8 C), temperature source Axillary, resp. rate (!) 32, SpO2 97%. Constitutional: sedated HEENT: ETT and OGT in palce Neck: no thyromegaly, trachea midline Chest: equal chest rise with mechanical ventilation Abdomen: soft, NT, no bruising, no hepatosplenomegaly, PEG in place GU: normal external male genitalia MSK: unable to  assess gait/station, no clubbing/cyanosis of fingers/toes, unable to assess ROM of all four extremities Skin: warm, dry, no rashes Psych:  unable to assess     Assessment/Plan: 67 yo M with complex medical history, most recently respiratory failure and recent PEG with concern on imaging for possible colonic injury from PEG.  Given high risk of any surgical intervention on patient, had a long discussion with family and will obtain further definitive imaging prior to any decision on surgery. -CT abdomen pelvis with enteral contrast ordered. This will better characterize if there is a contained perforation or persistent/large injury to colon. If contained or sealed off then will trial non-operative management. If free extravasation of contrast, will rediscuss risks with family to determine if operative intervention is in best interest of the patient. - IV Zosyn FEN - strict NPO Dispo -  CCM, CT scan read pending, no obvious contrast extrav on personal review. Will continue non-operative treatment strategy. Will discuss with radiologist as well.    I spent a total of 85 minutes in both face-to-face and non-face-to-face activities, excluding procedures performed, for this visit on the date of this encounter. I reviewed labs personally, reviewed imaging and personally interpreted and discussed care with family as well as primary provider with CCM team.  Donata Duff, MD [facility]

## 2023-07-01 NOTE — ED Notes (Signed)
OG tube clear for use per Dr. Chestine Spore, who is rounding at bedside

## 2023-07-01 NOTE — ED Notes (Signed)
RN reached out to phlebotomy for 2nd culture

## 2023-07-01 NOTE — ED Triage Notes (Signed)
Pt casme from [facility] with minimal report. Initially resp failure hx of COPD and mitral valve resection. Pt has peg tube, foley, and rectal tube. Pt brought to ED for surgical liver evaluation? Ischemic bowel? Pt arrives on NRB

## 2023-07-01 NOTE — ED Notes (Signed)
Pt too agitated to go to CT at this time. Pt unable to lay flat. MD notified.

## 2023-07-01 NOTE — Progress Notes (Signed)
   07/01/23 2300  Spiritual Encounters  Type of Visit Initial  Care provided to: Patient;Family  Referral source Trauma page  Reason for visit Trauma  OnCall Visit Yes   Ch responded to trauma level II. Family was present  at bedside. Ch provided hospitality and helped family process their thoughts. No follow-up needed at this time.

## 2023-07-01 NOTE — H&P (Signed)
NAME:  Joshua Eaton, MRN:  [id number], DOB:  29-Mar-1956, LOS: 0 ADMISSION DATE:  07/01/2023, CONSULTATION DATE:  10/31 REFERRING MD:  Shearon Balo, CHIEF COMPLAINT:  A on C respiratory failure   History of Present Illness:  Joshua Eaton is a 67 y/o gentleman with a history of respiratory failure and tracheostomy with recent admission from 10/3-10/28 at [facility] after a 2 week admission at [facility] in September for pneumonia complicated by ARDS and acute cardiogenic pulmonary edema. He has severe MR recently diagnosed and is not a candidate for valve replacement due to overall frailty. He underwent tracheostomy and PEG placement during his admission and was transferred to [facility] on 10/28. Later that day he had an accidental tracheostomy dislodgement, and his trach stayed out. Yesterday he developed increased work of breathing and was transferred to the ED for further evaluation. He was trialed on BiPAP but did not tolerate it well and eventually required reintubation in the ED. He underwent CT scan demonstrating possible contained bowel perforation; Ct with enteral contrast has now been ordered and surgery has evaluated him. PCCM was consulted for admission.   Pertinent  Medical History  HTN Respiratory failure requiring trach Afib DVT- provoked by PICC  Significant Hospital Events: Including procedures, antibiotic start and stop dates in addition to other pertinent events   10/3 initially admitted 10/6 trach 10/9 TEE with severe MR with mitral valve perforation 10/18 trach revision after cuff leak> required reintubation to stabilize 10/24 PEG placement 10/28 discharged to [facility], dislodged trach, which was then left out. 10/31 readmitted for respiratory failure  Interim History / Subjective:    Objective   Blood pressure (!) 108/57, pulse 82, temperature 98.3 F (36.8 C), temperature source Axillary, resp. rate (!) 21, SpO2 95%.    Vent Mode: PRVC FiO2 (%):  [50 %-100 %] 65 % Set Rate:   [14 bmp-20 bmp] 20 bmp Vt Set:  [640 mL] 640 mL PEEP:  [5 cmH20] 5 cmH20 Plateau Pressure:  [25 cmH20-27 cmH20] 27 cmH20  No intake or output data in the 24 hours ending 07/01/23 2219 There were no vitals filed for this visit.  Examination: General: critically ill appearing man lying in bed in NAD HENT: [place]/AT, eyes anicteric. ETT in place. Dried blood in mouth, R nare. Lungs: rhales bilaterally, some dark bloody secretions from ETT.  Pplat 32> Vt decreased to ~6cc/kg.  Cardiovascular: S1S2, holosystolic murmur at apex Abdomen: Soft, NT. PEG with either TF or purulent drainage in tubing. Rectal tube. Extremities: no peripheral edema, +muscle wasting Neuro: RASS -5, + coughing GU: foley with amber urine  7.26/57/67/26 BUN 41 Cr 1.0 AST 49 ALT 37 T bili 1.7 WBC 21.8 H/H 9.9/31.1 Platelets 132 UA: <5 WBC FOBT +  Ct personally reviewed> diffuse GG throughout both lungs, free air adjacent to hepatic flexure and descending colon.  Resolved Hospital Problem list     Assessment & Plan:  Acute respiratory failure with hypoxia & hypercapnia ARDS- unclear if pneumonia vs DAH from mitral regurgitation Recent tracheostomy dependence -LTVV, 6 cc/kg IBW with goal Pplat <30 and DP<15. Can liberate Vt as compliance improves. Repeat ABG in 1h. D/w RT. -VAP prevention protocol -PAD protocol for sedation -daily SAT & SBT as appropriate -hold Eliquis; discussed with his wife that he may need repeat trach revision (this would be his second) -broad spectrum antibiotics; collect trach aspirate culture  Possible bowel perforation -complete bowel rest -CT with enteral contrast ordered; appreciate surgery's management  Anemia due to critical illness  -  transfuse for Hb <7 or hemodynamically significant bleeding -monitor -hold Eliquis  RUE DVT provoked by PICC -hold AC due to hemoptysis, potential need for procedures -needs 3 months for provoked clot  Afib, paroxymsal -con't  amiodarone -hold eliquis for now  Thrombocytopenia -no current indication for transfusions  Hyperglycemia -SSI PRN -goal BG 140-180  ICU delirium -hold seroquel for now -PAD protocol  Deconditioning, debility Severe protein energy malnutrition -bowel rest for now -will need enteral TF restarted as soon as safe  Severe MR with leaflet perforation, preserved LV function -not a candidate for surgery currently -prevent high afterload  Moderate AI  -prevent high afterload  BPH -unable to take tamsulosin until able to take pills -con't foley; should be changed upon readmission> ordered  Perineal moisture associated skin breakdown -con't flexiseal currently -con't foley to avoid moisture accumulation on skin  Wife updated via phone this evening.   Best Practice (right click and "Reselect all SmartList Selections" daily)   Diet/type: NPO DVT prophylaxis: SCD GI prophylaxis: PPI Lines: N/A Foley:  Yes, and it is still needed Code Status:  full code Last date of multidisciplinary goals of care discussion [in ED]  Labs   CBC: Recent Labs  Lab 06/27/23 0747 06/29/23 0526 07/01/23 0500 07/01/23 1812 07/01/23 1817 07/01/23 2203  WBC 10.1 10.2 17.3*  --  21.8*  --   NEUTROABS 6.7 6.4 13.8*  --  19.6*  --   HGB 10.6* 11.2* 12.0* 9.9* 9.9* 10.2*  HCT 34.1* 35.5* 37.2* 29.0* 31.1* 30.0*  MCV 102.7* 101.1* 101.9*  --  101.3*  --   PLT 147* 166 143*  --  132*  --     Basic Metabolic Panel: Recent Labs  Lab 06/25/23 0530 06/26/23 0736 06/26/23 1817 06/27/23 0442 06/27/23 0747 06/28/23 0353 06/29/23 0525 06/29/23 0526 06/30/23 0458 07/01/23 1330 07/01/23 1812 07/01/23 1817 07/01/23 2203  NA 150* 152*   < >  --    < > 147*  --  152* 143 147* 146* 145 147*  K 3.4* 4.0   < >  --    < > 3.5  --  3.9 3.1* 4.5 4.6 4.5 4.4  CL 111 114*   < >  --    < > 111  --  114* 111 108  --  111  --   CO2 29 29   < >  --    < > 30  --  27 27 27   --  22  --   GLUCOSE 144*  178*   < >  --    < > 180*  --  131* 150* 183*  --  188*  --   BUN 50* 65*   < >  --    < > 75*  --  58* 44* 39*  --  41*  --   CREATININE 0.82 1.15   < >  --    < > 0.98  --  0.94 0.87 1.06  --  1.00  --   CALCIUM 9.8 9.7   < >  --    < > 10.2  --  10.2 8.6* 9.3  --  8.8*  --   MG 2.2  --   --  2.5*  --  2.5* 2.4  --   --   --   --   --   --   PHOS 2.8 4.8*  --   --   --   --  3.4  --   --   --   --   --   --    < > =  values in this interval not displayed.   GFR: Estimated Creatinine Clearance: 78.8 mL/min (by C-G formula based on SCr of 1 mg/dL). Recent Labs  Lab 06/27/23 0747 06/29/23 0526 07/01/23 0500 07/01/23 1817 07/01/23 1825  WBC 10.1 10.2 17.3* 21.8*  --   LATICACIDVEN  --   --   --   --  1.9    Liver Function Tests: Recent Labs  Lab 06/28/23 0353 06/29/23 0526 06/30/23 0458 07/01/23 1330 07/01/23 1817  AST 65* 108* 55* 48* 49*  ALT 44 74* 48* 38 37  ALKPHOS 68 90 69 72 74  BILITOT 0.9 1.1 0.9 1.8* 1.7*  PROT 6.4* 6.6 5.7* 6.1* 6.2*  ALBUMIN 2.6* 2.7* 2.4* 2.5* 2.4*   Recent Labs  Lab 07/01/23 1817  LIPASE 26   No results for input(s): "AMMONIA" in the last 168 hours.  ABG    Component Value Date/Time   PHART 7.259 (L) 07/01/2023 2203   PCO2ART 57.4 (H) 07/01/2023 2203   PO2ART 67 (L) 07/01/2023 2203   HCO3 25.7 07/01/2023 2203   TCO2 27 07/01/2023 2203   ACIDBASEDEF 2.0 07/01/2023 2203   O2SAT 89 07/01/2023 2203     Coagulation Profile: Recent Labs  Lab 07/01/23 1330  INR 1.9*    Cardiac Enzymes: No results for input(s): "CKTOTAL", "CKMB", "CKMBINDEX", "TROPONINI" in the last 168 hours.  HbA1C: Hgb A1c MFr Bld  Date/Time Value Ref Range Status  06/30/2023 04:58 AM 4.1 (L) 4.8 - 5.6 % Final    Comment:    (NOTE) Pre diabetes:          5.7%-6.4%  Diabetes:              >6.4%  Glycemic control for   <7.0% adults with diabetes   12/09/2015 06:30 PM 5.1 4.8 - 5.6 % Final    Comment:    (NOTE)         Pre-diabetes: 5.7 - 6.4          Diabetes: >6.4         Glycemic control for adults with diabetes: <7.0     CBG: Recent Labs  Lab 06/28/23 0003 06/28/23 0428 06/28/23 0725 06/28/23 1110 06/28/23 1530  GLUCAP 132* 151* 105* 147* 158*    Review of Systems:   Unable to be obtained due to mental status  Past Medical History:  He,  has a past medical history of Anxiety, Cancer (HCC), Depression, Hypertension, and OCD (obsessive compulsive disorder).   Surgical History:   Past Surgical History:  Procedure Laterality Date   CHOLECYSTECTOMY     COLON SURGERY     cancerous polyp removed   HERNIA REPAIR     IR GASTROSTOMY TUBE MOD SED  06/24/2023     Social History:   reports that he has never smoked. He has never used smokeless tobacco. He reports that he does not drink alcohol and does not use drugs.   Family History:  His family history is not on file.   Allergies Allergies  Allergen Reactions   Codeine     "sends me into orbit"     Home Medications  Prior to Admission medications   Medication Sig Start Date End Date Taking? Authorizing Provider  acetaminophen (TYLENOL) 325 MG tablet Take 650 mg by mouth every 6 (six) hours as needed for moderate pain.    [provider]  amiodarone (PACERONE) 200 MG tablet Place 1 tablet (200 mg total) into feeding tube 2 (two) times daily for 4 days,  THEN 1 tablet (200 mg total) daily. 06/28/23 08/01/23  Rhetta Mura, MD  apixaban (ELIQUIS) 5 MG TABS tablet Place 1 tablet (5 mg total) into feeding tube 2 (two) times daily. 06/28/23   Rhetta Mura, MD  aspirin EC 81 MG tablet Take 81 mg by mouth daily. Swallow whole.    [provider]  budesonide (PULMICORT) 0.5 MG/2ML nebulizer solution Take 0.5 mg by nebulization 2 (two) times daily.    [provider]  feeding supplement (OSMOLITE 1 CAL) LIQD Take 1,000 mLs by mouth daily. 65 mL/hr 06/28/23   Rhetta Mura, MD  ipratropium-albuterol (DUONEB) 0.5-2.5 (3) MG/3ML  SOLN Take 3 mLs by nebulization every 6 (six) hours.    [provider]  leptospermum manuka honey (MEDIHONEY) PSTE paste Apply 1 Application topically daily. 06/29/23   Rhetta Mura, MD  loperamide HCl (IMODIUM) 1 MG/7.5ML suspension Place 15 mLs (2 mg total) into feeding tube every 8 (eight) hours. 06/28/23   Rhetta Mura, MD  LORazepam (ATIVAN) 0.5 MG tablet Take 1 tablet (0.5 mg total) by mouth in the morning. 06/28/23   Rhetta Mura, MD  metoprolol tartrate (LOPRESSOR) 25 MG tablet Place 0.5 tablets (12.5 mg total) into feeding tube 2 (two) times daily. 06/28/23   Rhetta Mura, MD  Multiple Vitamins-Minerals (MULTIVITAL PO) Take 5 mLs by mouth in the morning and at bedtime.    [provider]  Nystatin (GERHARDT'S BUTT CREAM) CREA Apply 1 Application topically 2 (two) times daily. 06/28/23   Rhetta Mura, MD  omeprazole (PRILOSEC OTC) 20 MG tablet Take 1 tablet (20 mg total) by mouth daily. 06/28/23 06/27/24  Rhetta Mura, MD  QUEtiapine (SEROQUEL) 25 MG tablet Place 1 tablet (25 mg total) into feeding tube 2 (two) times daily. 06/28/23   Rhetta Mura, MD  Water For Irrigation, Sterile (FREE WATER) SOLN Place 200 mLs into feeding tube every 6 (six) hours. 06/28/23   Rhetta Mura, MD     Critical care time: 50 min.    Steffanie Dunn, DO 07/01/23 11:24 PM [facility]  For contact information, see Amion. If no response to pager, please call PCCM consult pager. After hours, 7PM- 7AM, please call Elink.

## 2023-07-01 NOTE — ED Notes (Signed)
2nd set of [facility] wasn't successful.

## 2023-07-01 NOTE — ED Provider Notes (Addendum)
[facility] DEPARTMENT AT [facility] Provider Note   CSN: [id number] Arrival date & time: 07/01/23  1753     History  Chief Complaint  Patient presents with   Abdominal Pain    Mr. Deitrick Ferreri is a 67 year old Caucasian male with medical history of colon cancer, status post right hemicolectomy(2007), COPD, and mitral valve regurgitation who was initially presented to [facility] on 05/20/2023 where he was admitted to the intensive care unit with acute hypoxic resp failure secondary to pulmonary edema and multifocal pneumonia with history of mitral valve regurgitation. He was intubated on 05/21/2023 and developed ARDS. Due to his high vent requirements he was transferred to [facility] on 06/03/2023. On 06/06/2023, he underwent a tracheostomy. PEG tube placed 10/24 by IR here. Janina Mayo became dislodged 2 days ago and was not replaced due to stable respiratory status initially. Also developed PE this hospitalization and started on BID Eliquis. He was discharged from [facility] and moved to [facility] yesterday. Since last night he had been having worsening respiratory status especially when laying flat. X rays of the chest and abdomen were obtained and concerning for pneumatosis.  CT was recommended but unable to be obtained as when patient was laid down for CT scanner he decompensated from a respiratory standpoint.  He was unable to be transported immediately to the ICU as ICU requested he be stabilized in the ED first.  He arrives to the ED on nonrebreather.   The history is provided by medical records, a caregiver and the nursing home. The history is limited by the condition of the patient.       Home Medications Prior to Admission medications   Medication Sig Start Date End Date Taking? Authorizing Provider  acetaminophen (TYLENOL) 325 MG tablet Take 650 mg by mouth every 6 (six) hours as needed for moderate pain.    [provider]  amiodarone (PACERONE) 200 MG  tablet Place 1 tablet (200 mg total) into feeding tube 2 (two) times daily for 4 days, THEN 1 tablet (200 mg total) daily. 06/28/23 08/01/23  Rhetta Mura, MD  apixaban (ELIQUIS) 5 MG TABS tablet Place 1 tablet (5 mg total) into feeding tube 2 (two) times daily. 06/28/23   Rhetta Mura, MD  aspirin EC 81 MG tablet Take 81 mg by mouth daily. Swallow whole.    [provider]  budesonide (PULMICORT) 0.5 MG/2ML nebulizer solution Take 0.5 mg by nebulization 2 (two) times daily.    [provider]  feeding supplement (OSMOLITE 1 CAL) LIQD Take 1,000 mLs by mouth daily. 65 mL/hr 06/28/23   Rhetta Mura, MD  ipratropium-albuterol (DUONEB) 0.5-2.5 (3) MG/3ML SOLN Take 3 mLs by nebulization every 6 (six) hours.    [provider]  leptospermum manuka honey (MEDIHONEY) PSTE paste Apply 1 Application topically daily. 06/29/23   Rhetta Mura, MD  loperamide HCl (IMODIUM) 1 MG/7.5ML suspension Place 15 mLs (2 mg total) into feeding tube every 8 (eight) hours. 06/28/23   Rhetta Mura, MD  LORazepam (ATIVAN) 0.5 MG tablet Take 1 tablet (0.5 mg total) by mouth in the morning. 06/28/23   Rhetta Mura, MD  metoprolol tartrate (LOPRESSOR) 25 MG tablet Place 0.5 tablets (12.5 mg total) into feeding tube 2 (two) times daily. 06/28/23   Rhetta Mura, MD  Multiple Vitamins-Minerals (MULTIVITAL PO) Take 5 mLs by mouth in the morning and at bedtime.    [provider]  Nystatin (GERHARDT'S BUTT CREAM) CREA Apply 1 Application topically 2 (two) times  daily. 06/28/23   Rhetta Mura, MD  omeprazole (PRILOSEC OTC) 20 MG tablet Take 1 tablet (20 mg total) by mouth daily. 06/28/23 06/27/24  Rhetta Mura, MD  QUEtiapine (SEROQUEL) 25 MG tablet Place 1 tablet (25 mg total) into feeding tube 2 (two) times daily. 06/28/23   Rhetta Mura, MD  Water For Irrigation, Sterile (FREE WATER) SOLN Place 200 mLs into feeding tube  every 6 (six) hours. 06/28/23   Rhetta Mura, MD      Allergies    Codeine    Review of Systems   Review of Systems unable to obtain  Physical Exam Updated Vital Signs BP (!) 157/63   Pulse (!) 105   Temp 98.3 F (36.8 C) (Axillary)   Resp (!) 32   SpO2 97%  Physical Exam Constitutional:      Comments: Alert adult male with nonrebreather in place looking around room.  Not agitated.  Throughout evaluation he becomes more somnolent.  Does not follow commands  HENT:     Head: Normocephalic and atraumatic.     Mouth/Throat:     Comments: Prior tracheostomy site is covered in gauze Cardiovascular:     Rate and Rhythm: Normal rate and regular rhythm.     Heart sounds: Normal heart sounds.  Pulmonary:     Effort: Pulmonary effort is normal.     Breath sounds: Rhonchi (to right lung) present.  Abdominal:     General: There is no distension.     Palpations: Abdomen is soft.     Tenderness: There is no abdominal tenderness.  Genitourinary:    Comments: Foley, rectal tube in place Skin:    General: Skin is warm and dry.     Capillary Refill: Capillary refill takes less than 2 seconds.  Neurological:     Comments: Alert adult male with symmetric pupils, no gaze deviation or preference.  Does not follow commands.  Eyes track to voice but does not verbalize.  Moves all 4 extremities.     ED Results / Procedures / Treatments   Labs (all labs ordered are listed, but only abnormal results are displayed) Labs Reviewed  CBC WITH DIFFERENTIAL/PLATELET - Abnormal; Notable for the following components:      Result Value   WBC 21.8 (*)    RBC 3.07 (*)    Hemoglobin 9.9 (*)    HCT 31.1 (*)    MCV 101.3 (*)    RDW 16.8 (*)    Platelets 132 (*)    Neutro Abs 19.6 (*)    Abs Immature Granulocytes 0.27 (*)    All other components within normal limits  COMPREHENSIVE METABOLIC PANEL - Abnormal; Notable for the following components:   Glucose, Bld 188 (*)    BUN 41 (*)     Calcium 8.8 (*)    Total Protein 6.2 (*)    Albumin 2.4 (*)    AST 49 (*)    Total Bilirubin 1.7 (*)    All other components within normal limits  URINALYSIS, ROUTINE W REFLEX MICROSCOPIC - Abnormal; Notable for the following components:   APPearance HAZY (*)    Hgb urine dipstick MODERATE (*)    Bacteria, UA RARE (*)    All other components within normal limits  I-STAT ARTERIAL BLOOD GAS, ED - Abnormal; Notable for the following components:   pH, Arterial 7.475 (*)    pO2, Arterial 59 (*)    Sodium 146 (*)    HCT 29.0 (*)    Hemoglobin 9.9 (*)  All other components within normal limits  POC OCCULT BLOOD, ED - Abnormal; Notable for the following components:   Fecal Occult Bld POSITIVE (*)    All other components within normal limits  I-STAT ARTERIAL BLOOD GAS, ED - Abnormal; Notable for the following components:   pH, Arterial 7.259 (*)    pCO2 arterial 57.4 (*)    pO2, Arterial 67 (*)    Sodium 147 (*)    HCT 30.0 (*)    Hemoglobin 10.2 (*)    All other components within normal limits  CULTURE, BLOOD (ROUTINE X 2)  CULTURE, BLOOD (ROUTINE X 2)  URINE CULTURE  CULTURE, RESPIRATORY W GRAM STAIN  LIPASE, BLOOD  OCCULT BLOOD X 1 CARD TO LAB, STOOL  TRIGLYCERIDES  BLOOD GAS, ARTERIAL  BLOOD GAS, ARTERIAL  CBC  BASIC METABOLIC PANEL  BLOOD GAS, ARTERIAL  MAGNESIUM  PHOSPHORUS  I-STAT CG4 LACTIC ACID, ED  I-STAT CG4 LACTIC ACID, ED    EKG None  Radiology DG Chest Portable 1 View  Result Date: 07/01/2023 CLINICAL DATA:  Intubation. EXAM: PORTABLE CHEST 1 VIEW COMPARISON:  Chest radiograph dated 07/01/2023. FINDINGS: Interval placement of an endotracheal tube with tip approximately 2.5 cm above the carina. Enteric tube extends below the diaphragm with tip in the epigastric area. Bilateral pulmonary opacities similar or slightly progressed since the earlier radiograph. No large pleural effusion or pneumothorax. Stable cardiomediastinal silhouette. No acute osseous  pathology. IMPRESSION: 1. Endotracheal tube with tip approximately 2.5 cm above the carina. 2. Bilateral pulmonary opacities similar or slightly progressed since the earlier radiograph. Electronically Signed   By: Elgie Collard M.D.   On: 07/01/2023 22:43   CT CHEST ABDOMEN PELVIS W CONTRAST  Result Date: 07/01/2023 CLINICAL DATA:  Acute nonlocalized abdominal pain. Respiratory failure. Suspicion of intramural air on abdominal radiograph. EXAM: CT CHEST, ABDOMEN, AND PELVIS WITH CONTRAST TECHNIQUE: Multidetector CT imaging of the chest, abdomen and pelvis was performed following the standard protocol during bolus administration of intravenous contrast. RADIATION DOSE REDUCTION: This exam was performed according to the departmental dose-optimization program which includes automated exposure control, adjustment of the mA and/or kV according to patient size and/or use of iterative reconstruction technique. CONTRAST:  75mL OMNIPAQUE IOHEXOL 350 MG/ML SOLN COMPARISON:  Chest and abdominal radiograph 07/01/2023. CT abdomen and pelvis 06/21/2023. CT chest 06/15/2023 FINDINGS: CT CHEST FINDINGS Cardiovascular: Normal heart size. No pericardial effusions. Normal caliber thoracic aorta. Calcification of the aorta. Mediastinum/Nodes: Esophagus is decompressed. Thyroid gland is unremarkable. No significant lymphadenopathy. Lungs/Pleura: Diffuse airspace disease throughout the lungs likely representing pulmonary edema although aspiration or severe multifocal pneumonia could also have this appearance. No pleural effusions. No pneumothorax. Musculoskeletal: No chest wall mass or suspicious bone lesions identified. CT ABDOMEN PELVIS FINDINGS Hepatobiliary: No focal liver abnormality is seen. Status post cholecystectomy. No biliary dilatation. Pancreas: Unremarkable. No pancreatic ductal dilatation or surrounding inflammatory changes. Spleen: Normal in size without focal abnormality. Adrenals/Urinary Tract: Adrenal glands  are unremarkable. Kidneys are normal, without renal calculi, focal lesion, or hydronephrosis. Foley catheter is in the bladder. Residual contrast material in the renal collecting systems, ureters, and bladder. Stomach/Bowel: Stomach, small bowel, and colon are not abnormally distended. There is a gastrostomy tube positioned along the greater curvature of the stomach. The retention balloon is tenting of the gastric wall but is likely within the stomach. There is residual contrast material in the colon. There is extraluminal gas tracking along the hepatic flexure of the colon and extending along the descending colon. This is  likely related to recent gastrostomy tube placement and could indicate colonic injury. Ischemia is less likely to have this appearance given the appearance and distribution of the gas. No focal wall thickening is identified. Vascular/Lymphatic: Aortic atherosclerosis. No enlarged abdominal or pelvic lymph nodes. Reproductive: Prostate is unremarkable. Other: No free intraperitoneal air. No free fluid. Postoperative right inguinal mesh hernia repair. Small periumbilical hernia containing fat. Musculoskeletal: No acute or significant osseous findings. IMPRESSION: 1. Percutaneous gastrostomy tube appears in place along the greater curvature of the stomach, tenting of the gastric wall. 2. Extraluminal pericolonic gas along the splenic flexure and descending colon. This appears to be extramural gas rather than pneumatosis. This is likely related to the prior procedure in may indicate colonic injury with extraperitoneal gas along the colon. 3. No free air or free fluid in the intraperitoneal spaces. 4. Diffuse airspace disease throughout both lungs most likely edema but could indicate severe pneumonia, aspiration, or ARDS. These results were called by telephone at the time of interpretation on 07/01/2023 at 9:35 pm to provider [facility] , who verbally acknowledged these results. Electronically  Signed   By: Burman Nieves M.D.   On: 07/01/2023 21:45   DG Chest Portable 1 View  Result Date: 07/01/2023 CLINICAL DATA:  Altered mental status. EXAM: PORTABLE CHEST 1 VIEW COMPARISON:  Chest radiograph dated 07/01/2023. FINDINGS: Slight interval improvement in bilateral pulmonary opacities since the earlier radiograph. No pleural effusion or pneumothorax. Stable cardiomediastinal silhouette. No acute osseous pathology. IMPRESSION: Slight interval improvement in bilateral pulmonary opacities. Electronically Signed   By: Elgie Collard M.D.   On: 07/01/2023 21:30   DG Abd 1 View  Result Date: 07/01/2023 CLINICAL DATA:  Ileus. EXAM: ABDOMEN - 1 VIEW COMPARISON:  One view abdomen 06/28/23 FINDINGS: Gastrostomy tube is stable. Progressive intramural air is present at the splenic flexure and proximal descending colon, concerning for pneumatosis. No proximal obstruction is present. No definite free air is present on this single supine view. IMPRESSION: 1. Progressive intramural air at the splenic flexure and proximal descending colon, concerning for pneumatosis. Recommend CT of the abdomen and pelvis with IV contrast. These results will be called to the ordering clinician or representative by the Radiologist Assistant, and communication documented in the PACS or [employer]. Electronically Signed   By: Marin Roberts M.D.   On: 07/01/2023 11:24   DG CHEST PORT 1 VIEW  Result Date: 07/01/2023 CLINICAL DATA:  Shortness of breath. Aspiration and shortness of breath. EXAM: PORTABLE CHEST 1 VIEW COMPARISON:  One view chest x-ray 06/28/2023. FINDINGS: Tracheostomy tube was removed. Heart is enlarged. Volumes are low. Increasing interstitial and airspace opacities are present bilaterally. IMPRESSION: 1. Increasing interstitial and airspace opacities bilaterally likely reflects edema. 2. Cardiomegaly without failure. Electronically Signed   By: Marin Roberts M.D.   On: 07/01/2023 11:19     Procedures Procedure Name: Intubation Date/Time: 07/01/2023 9:09 PM  Performed by: Karmen Stabs, MDPre-anesthesia Checklist: Patient identified, Patient being monitored, Suction available, Emergency Drugs available and Timeout performed Oxygen Delivery Method: Non-rebreather mask Preoxygenation: Pre-oxygenation with 100% oxygen Induction Type: Rapid sequence Ventilation: Mask ventilation without difficulty Laryngoscope Size: Mac and 4 Grade View: Grade I Tube size: 7.0 mm Number of attempts: 1 Airway Equipment and Method: Video-laryngoscopy Placement Confirmation: ETT inserted through vocal cords under direct vision, Positive ETCO2, Breath sounds checked- equal and bilateral and CO2 detector Secured at: 24 cm Tube secured with: ETT holder Dental Injury: Teeth and Oropharynx as per pre-operative assessment  Medications Ordered in ED Medications  piperacillin-tazobactam (ZOSYN) IVPB 3.375 g (has no administration in time range)  etomidate (AMIDATE) injection (20 mg Intravenous Given 07/01/23 2052)  propofol (DIPRIVAN) 1000 MG/100ML infusion (30 mcg/kg/min  81.1 kg Intravenous Rate/Dose Change 07/01/23 2245)  fentaNYL in NS (5mcg/ml) infusion-PREMIX (100 mcg/hr Intravenous Rate/Dose Change 07/01/23 2235)  fentaNYL (SUBLIMAZE) bolus via infusion 25-100 mcg (has no administration in time range)  amiodarone (PACERONE) tablet 200 mg (has no administration in time range)  budesonide (PULMICORT) nebulizer solution 0.5 mg (has no administration in time range)  leptospermum manuka honey (MEDIHONEY) paste 1 Application (has no administration in time range)  Gerhardt's butt cream 1 Application (has no administration in time range)  polyethylene glycol (MIRALAX / GLYCOLAX) packet 17 g (has no administration in time range)  docusate (COLACE) 50 MG/5ML liquid 100 mg (has no administration in time range)  polyethylene glycol (MIRALAX / GLYCOLAX) packet 17 g (has no  administration in time range)  pantoprazole (PROTONIX) injection 40 mg (has no administration in time range)  ipratropium-albuterol (DUONEB) 0.5-2.5 (3) MG/3ML nebulizer solution 3 mL (has no administration in time range)  insulin aspart (novoLOG) injection 0-15 Units (has no administration in time range)  LORazepam (ATIVAN) injection 0.5 mg (0.5 mg Intravenous Given 07/01/23 1859)  droperidol (INAPSINE) 2.5 MG/ML injection 1.25 mg (1.25 mg Intravenous Given 07/01/23 1931)  iohexol (OMNIPAQUE) 350 MG/ML injection 75 mL (75 mLs Intravenous Contrast Given 07/01/23 2030)    ED Course/ Medical Decision Making/ A&P                              Medical Decision Making Amount and/or Complexity of Data Reviewed Labs: ordered. Decision-making details documented in ED Course. Radiology: ordered and independent interpretation performed. Decision-making details documented in ED Course. ECG/medicine tests: ordered and independent interpretation performed. Decision-making details documented in ED Course.  Risk Prescription drug management. Decision regarding hospitalization.   67 year old male presents to the ED for reasons described above.  Differential considered for this patient includes worsening respiratory distress due to worsening pneumonia, pneumothorax, airway edema, fluid overload, sepsis possibly due to abdominal source given concerns for pneumatosis though any source is possible including lungs urine etc. On arrival he is alert and hemodynamically stable on nonrebreather.  Throughout his initial evaluation he became more somnolent and was placed on BiPAP which he continuously attempted to remove.  Initial EKG is notable for normal sinus rhythm rate of 88, leftward axis, normal intervals, no STEMI, no ST segment depressions, no pathologic T wave inversions or overt ischemic changes on my interpretation.  Initial gas reassuring with pH 7.47, pCO2 of 34 not hypercarbic, normal bicarb.  Lactate is  1.9 which is less consistent with pneumatosis or bowel ischemia.  Remainder of labs show leukocytosis of 21, hemoglobin of 9.9 dropped from 12 in past 14 hours, normal lipase, t bili 1.7 up from 14 hours ago remainder of LFTs stable. BUN/Cre ratio is >20 possibly suggestive of upper GI bleed along with his 2 point hemoglobin drop. Occult blood testing of the rectal tube contents was FOBT positive. Blood cultures and urine cultures were sent.  CT chest abdomen pelvis showed extraluminal pericolonic gas along the splenic flexure and descending colon that is extramural, likely related to G-tube placement procedure.  No pneumatosis.  Lungs with diffuse airspace disease most likely edema but could be secondary to severe pneumonia, aspiration, or ards.   I was called to  bedside regarding patient agitation on BiPAP.  Initially trialed IV Ativan and droperidol in order to facilitate CT scan as above.  When he returned from CT he was increasingly agitated and tachypneic with increased work of breathing, removing BiPAP mask and desatting into the 70s.  He was intubated for hypoxic respiratory failure and airway protection with etomidate and rocuronium RSI.  7 oh cuffed ET tube passed without difficulty.  Significant dried blood noted in the oropharynx, likely from prior tracheostomy dislodgement.  ET tube placement confirmed on chest x-ray.  OG tube was placed and confirmed on KUB.  Surgery was consulted due to intra-abdominal air and recommended CT abdomen pelvis with p.o. contrast; will follow the patient in the ICU.  Patient was then admitted to medical ICU for further care, pending the requested CT scan with p.o. contrast prior to moving to inpatient bed. Oncoming ED team made aware for patient safety and situational awareness.          Final Clinical Impression(s) / ED Diagnoses Final diagnoses:  Acute respiratory failure with hypoxia (HCC)  Free intraperitoneal air  Acute pulmonary edema [facility])    Rx /  DC Orders ED Discharge Orders     None           Karmen Stabs, MD 07/01/23 1610    Jacalyn Lefevre, MD 07/02/23 Windell Moment

## 2023-07-01 NOTE — Progress Notes (Signed)
RT and RN transported BIPAP patient to CT and Back to the ED.

## 2023-07-02 ENCOUNTER — Inpatient Hospital Stay (HOSPITAL_COMMUNITY): Payer: Medicare Other

## 2023-07-02 DIAGNOSIS — K668 Other specified disorders of peritoneum: Secondary | ICD-10-CM

## 2023-07-02 DIAGNOSIS — I2699 Other pulmonary embolism without acute cor pulmonale: Secondary | ICD-10-CM | POA: Diagnosis not present

## 2023-07-02 DIAGNOSIS — J810 Acute pulmonary edema: Secondary | ICD-10-CM

## 2023-07-02 DIAGNOSIS — J9602 Acute respiratory failure with hypercapnia: Secondary | ICD-10-CM

## 2023-07-02 DIAGNOSIS — L8990 Pressure ulcer of unspecified site, unspecified stage: Secondary | ICD-10-CM | POA: Insufficient documentation

## 2023-07-02 DIAGNOSIS — E440 Moderate protein-calorie malnutrition: Secondary | ICD-10-CM | POA: Insufficient documentation

## 2023-07-02 DIAGNOSIS — J9601 Acute respiratory failure with hypoxia: Secondary | ICD-10-CM | POA: Diagnosis not present

## 2023-07-02 LAB — BASIC METABOLIC PANEL
Anion gap: 7 (ref 5–15)
BUN: 48 mg/dL — ABNORMAL HIGH (ref 8–23)
CO2: 26 mmol/L (ref 22–32)
Calcium: 9.3 mg/dL (ref 8.9–10.3)
Chloride: 111 mmol/L (ref 98–111)
Creatinine, Ser: 1.25 mg/dL — ABNORMAL HIGH (ref 0.61–1.24)
GFR, Estimated: >60 mL/min (ref >60)
Glucose, Bld: 160 mg/dL — ABNORMAL HIGH (ref 70–99)
Potassium: 4.8 mmol/L (ref 3.5–5.1)
Sodium: 144 mmol/L (ref 135–145)

## 2023-07-02 LAB — CBC
HCT: 27.2 % — ABNORMAL LOW (ref 39.0–52.0)
Hemoglobin: 8.5 g/dL — ABNORMAL LOW (ref 13.0–17.0)
MCH: 32.2 pg (ref 26.0–34.0)
MCHC: 31.3 g/dL (ref 30.0–36.0)
MCV: 103 fL — ABNORMAL HIGH (ref 80.0–100.0)
Platelets: 185 10*3/uL (ref 150–400)
RBC: 2.64 MIL/uL — ABNORMAL LOW (ref 4.22–5.81)
RDW: 17 % — ABNORMAL HIGH (ref 11.5–15.5)
WBC: 25 10*3/uL — ABNORMAL HIGH (ref 4.0–10.5)
nRBC: 0.1 % (ref 0.0–0.2)

## 2023-07-02 LAB — MAGNESIUM
Magnesium: 2.4 mg/dL (ref 1.7–2.4)
Magnesium: 2.6 mg/dL — ABNORMAL HIGH (ref 1.7–2.4)

## 2023-07-02 LAB — PHOSPHORUS
Phosphorus: 4.6 mg/dL (ref 2.5–4.6)
Phosphorus: 5 mg/dL — ABNORMAL HIGH (ref 2.5–4.6)

## 2023-07-02 LAB — BLOOD GAS, ARTERIAL
Acid-Base Excess: 0.6 mmol/L (ref 0.0–2.0)
Bicarbonate: 26.6 mmol/L (ref 20.0–28.0)
O2 Saturation: 99.5 %
Patient temperature: 36.7
pCO2 arterial: 46 mm[Hg] (ref 32–48)
pH, Arterial: 7.36 (ref 7.35–7.45)
pO2, Arterial: 120 mm[Hg] — ABNORMAL HIGH (ref 83–108)

## 2023-07-02 LAB — GLUCOSE, CAPILLARY
Glucose-Capillary: 100 mg/dL — ABNORMAL HIGH (ref 70–99)
Glucose-Capillary: 113 mg/dL — ABNORMAL HIGH (ref 70–99)
Glucose-Capillary: 125 mg/dL — ABNORMAL HIGH (ref 70–99)
Glucose-Capillary: 126 mg/dL — ABNORMAL HIGH (ref 70–99)
Glucose-Capillary: 133 mg/dL — ABNORMAL HIGH (ref 70–99)
Glucose-Capillary: 135 mg/dL — ABNORMAL HIGH (ref 70–99)
Glucose-Capillary: 141 mg/dL — ABNORMAL HIGH (ref 70–99)

## 2023-07-02 LAB — TRIGLYCERIDES: Triglycerides: 63 mg/dL (ref <150)

## 2023-07-02 LAB — PROCALCITONIN: Procalcitonin: 2.91 ng/mL

## 2023-07-02 MED ORDER — ADULT MULTIVITAMIN W/MINERALS CH
1.0000 | ORAL_TABLET | Freq: Every day | ORAL | Status: DC
  Administered 2023-07-02 – 2023-07-13 (×12): 1
  Filled 2023-07-02 (×12): qty 1

## 2023-07-02 MED ORDER — NOREPINEPHRINE 4 MG/250ML-% IV SOLN
INTRAVENOUS | Status: AC
  Administered 2023-07-02: 2 ug/min via INTRAVENOUS
  Filled 2023-07-02: qty 250

## 2023-07-02 MED ORDER — LIDOCAINE-EPINEPHRINE 1 %-1:100000 IJ SOLN: 20.0000 mL | Freq: Once | INTRAMUSCULAR | Status: DC

## 2023-07-02 MED ORDER — FUROSEMIDE 10 MG/ML IJ SOLN
40.0000 mg | Freq: Once | INTRAMUSCULAR | Status: AC
  Administered 2023-07-02: 40 mg via INTRAVENOUS
  Filled 2023-07-02: qty 4

## 2023-07-02 MED ORDER — PROSOURCE TF20 ENFIT COMPATIBL EN LIQD
60.0000 mL | Freq: Every day | ENTERAL | Status: DC
  Administered 2023-07-02 – 2023-07-13 (×11): 60 mL
  Filled 2023-07-02 (×11): qty 60

## 2023-07-02 MED ORDER — ORAL CARE MOUTH RINSE: 15.0000 mL | OROMUCOSAL | Status: DC | PRN

## 2023-07-02 MED ORDER — MIDAZOLAM HCL 2 MG/2ML IJ SOLN: 4.0000 mg | Freq: Once | INTRAMUSCULAR | Status: DC

## 2023-07-02 MED ORDER — SODIUM CHLORIDE 0.9 % IV SOLN
250.0000 mL | INTRAVENOUS | Status: DC
  Administered 2023-07-02: 250 mL via INTRAVENOUS

## 2023-07-02 MED ORDER — ORAL CARE MOUTH RINSE
15.0000 mL | OROMUCOSAL | Status: DC
  Administered 2023-07-02 – 2023-07-08 (×73): 15 mL via OROMUCOSAL

## 2023-07-02 MED ORDER — ROCURONIUM BROMIDE 10 MG/ML (PF) SYRINGE: 100.0000 mg | PREFILLED_SYRINGE | Freq: Once | INTRAVENOUS | Status: DC

## 2023-07-02 MED ORDER — JUVEN PO PACK
1.0000 | PACK | Freq: Two times a day (BID) | ORAL | Status: DC
  Administered 2023-07-02 – 2023-07-13 (×22): 1
  Filled 2023-07-02 (×21): qty 1

## 2023-07-02 MED ORDER — MIDAZOLAM HCL 2 MG/2ML IJ SOLN
INTRAMUSCULAR | Status: AC
  Administered 2023-07-02: 2 mg via INTRAVENOUS
  Filled 2023-07-02: qty 2

## 2023-07-02 MED ORDER — LACTATED RINGERS IV SOLN: INTRAVENOUS | Status: DC

## 2023-07-02 MED ORDER — IOHEXOL 350 MG/ML SOLN
75.0000 mL | Freq: Once | INTRAVENOUS | Status: AC | PRN
  Administered 2023-07-02: 75 mL via INTRAVENOUS

## 2023-07-02 MED ORDER — FENTANYL CITRATE PF 50 MCG/ML IJ SOSY
200.0000 ug | PREFILLED_SYRINGE | Freq: Once | INTRAMUSCULAR | Status: DC
Start: 2023-07-02 — End: 2023-07-05

## 2023-07-02 MED ORDER — ETOMIDATE 2 MG/ML IV SOLN: 20.0000 mg | Freq: Once | INTRAVENOUS | Status: DC

## 2023-07-02 MED ORDER — EPINEPHRINE 1 MG/10ML IJ SOSY
PREFILLED_SYRINGE | INTRAMUSCULAR | Status: AC
  Filled 2023-07-02: qty 10

## 2023-07-02 MED ORDER — JEVITY 1.5 CAL/FIBER PO LIQD
1000.0000 mL | ORAL | Status: DC
  Administered 2023-07-02 – 2023-07-13 (×14): 1000 mL
  Filled 2023-07-02 (×19): qty 1000

## 2023-07-02 MED ORDER — LACTATED RINGERS IV BOLUS
1000.0000 mL | Freq: Once | INTRAVENOUS | Status: AC
  Administered 2023-07-02: 1000 mL via INTRAVENOUS

## 2023-07-02 MED ORDER — FREE WATER
150.0000 mL | Status: DC
  Administered 2023-07-02 – 2023-07-05 (×15): 150 mL

## 2023-07-02 MED ORDER — NOREPINEPHRINE 4 MG/250ML-% IV SOLN
2.0000 ug/min | INTRAVENOUS | Status: DC
  Administered 2023-07-02: 10 ug/min via INTRAVENOUS
  Administered 2023-07-03: 8 ug/min via INTRAVENOUS
  Filled 2023-07-02: qty 500
  Filled 2023-07-02: qty 250

## 2023-07-02 MED ORDER — MIDAZOLAM HCL 2 MG/2ML IJ SOLN: 2.0000 mg | Freq: Once | INTRAMUSCULAR | Status: AC

## 2023-07-02 NOTE — Progress Notes (Signed)
NAME:  Joshua Eaton, MRN:  [id number], DOB:  [birth year], LOS: 1 ADMISSION DATE:  07/01/2023, CONSULTATION DATE:  10/31 REFERRING MD:  Shearon Balo, CHIEF COMPLAINT:  A on C respiratory failure   History of Present Illness:  Joshua Eaton is a 67 y/o gentleman with a history of respiratory failure and tracheostomy with recent admission from 10/3-10/28 at [facility] after a 2 week admission at [facility] in September for pneumonia complicated by ARDS and acute cardiogenic pulmonary edema. He has severe MR recently diagnosed and is not a candidate for valve replacement due to overall frailty. He underwent tracheostomy and PEG placement during his admission and was transferred to [facility] on 10/28. Later that day he had an accidental tracheostomy dislodgement, and his trach stayed out. Yesterday he developed increased work of breathing and was transferred to the ED for further evaluation. He was trialed on BiPAP but did not tolerate it well and eventually required reintubation in the ED. He underwent CT scan demonstrating possible contained bowel perforation; Ct with enteral contrast has now been ordered and surgery has evaluated him. PCCM was consulted for admission.   Pertinent  Medical History  HTN Respiratory failure requiring trach Afib DVT- provoked by PICC  Significant Hospital Events: Including procedures, antibiotic start and stop dates in addition to other pertinent events   10/3 initially admitted 10/6 trach 10/9 TEE with severe MR with mitral valve perforation 10/18 trach revision after cuff leak> required reintubation to stabilize 10/24 PEG placement 10/28 discharged to [facility], dislodged trach, which was then left out. 10/31 readmitted for respiratory failure  Interim History / Subjective:  Remains on vent. 40 % FiO2 On SBT this morning and tolerating well.  On SAT he is arousable.   Objective   Blood pressure (!) 149/49, pulse 76, temperature 98.3 F (36.8 C), temperature source Oral,  resp. rate 13, height 6' 0.99" (1.854 m), weight 87.2 kg, SpO2 96%.    Vent Mode: PRVC FiO2 (%):  [50 %-100 %] 50 % Set Rate:  [14 bmp-24 bmp] 24 bmp Vt Set:  [500 mL-640 mL] 500 mL PEEP:  [5 cmH20-8 cmH20] 8 cmH20 Plateau Pressure:  [24 cmH20-27 cmH20] 25 cmH20   Intake/Output Summary (Last 24 hours) at 07/02/2023 0838 Last data filed at 07/02/2023 0800 Gross per 24 hour  Intake 3205.39 ml  Output 790 ml  Net 2415.39 ml   Filed Weights   07/02/23 0500  Weight: 87.2 kg    Examination: General: thin adult male in NAD HENT: [place]/AT, PERRL. Dried blood R nare.  Lungs: Clear bilateral breath sounds Cardiovascular: RRR, 4/6 murmur Abdomen: Soft, NT, ND Extremities: No acute deformity or edema.  Neuro: Arouses to voice, not cooperating with exam currently.  GU: foley with amber urine   CT> diffuse GG throughout both lungs, free air adjacent to hepatic flexure and descending colon.  Resolved Hospital Problem list     Assessment & Plan:  Acute respiratory failure with hypoxia & hypercapnia ARDS- unclear if pneumonia vs edema vs DAH from mitral regurgitation Recent tracheostomy dependence -6cc/kg vent support -Now on SBT an tolerating well.  -VAP prevention protocol -PAD protocol for sedation -Give a dose of lasix as weight is up -hold Eliquis; Dr. Chestine Spore discussed with his wife that he may need repeat trach revision (this would be his second). May need to involve ENT.  -broad spectrum antibiotics -follow cultures  Possible bowel perforation -complete bowel rest - appreciate surgery evaluation. No surgical indication.   Anemia due to critical illness  -  transfuse for Hb <7 or hemodynamically significant bleeding -monitor -hold Eliquis  RUE DVT provoked by PICC -holding AC due to hemoptysis, potential need for procedures -needs 3 months for provoked clot  Afib, paroxymsal -amiodarone continue -holding eliquis for now  Thrombocytopenia -no current indication for  transfusions  Hyperglycemia -SSI PRN -goal BG 140-180  ICU delirium -hold seroquel for now -PAD protocol -Propofol and fentanyl for RASS goal 0 to -1.   Deconditioning, debility Severe protein energy malnutrition -bowel rest for now -will need enteral TF restarted as soon as safe. Meds ok via tube per surgery for now.   Severe MR with leaflet perforation, preserved LV function -not a candidate for surgery currently -prevent high afterload  Moderate AI  -prevent high afterload  BPH -unable to take tamsulosin until able to take pills -Foley  Perineal moisture associated skin breakdown -Foley, flexiseal   Best Practice (right click and "Reselect all SmartList Selections" daily)   Diet/type: NPO DVT prophylaxis: SCD GI prophylaxis: PPI Lines: N/A Foley:  Yes, and it is still needed Code Status:  full code Last date of multidisciplinary goals of care discussion [in ED]  Labs   CBC: Recent Labs  Lab 06/27/23 0747 06/29/23 0526 07/01/23 0500 07/01/23 1812 07/01/23 1817 07/01/23 2203 07/02/23 0015  WBC 10.1 10.2 17.3*  --  21.8*  --  25.0*  NEUTROABS 6.7 6.4 13.8*  --  19.6*  --   --   HGB 10.6* 11.2* 12.0* 9.9* 9.9* 10.2* 8.5*  HCT 34.1* 35.5* 37.2* 29.0* 31.1* 30.0* 27.2*  MCV 102.7* 101.1* 101.9*  --  101.3*  --  103.0*  PLT 147* 166 143*  --  132*  --  185    Basic Metabolic Panel: Recent Labs  Lab 06/26/23 0736 06/26/23 1817 06/27/23 0442 06/27/23 0747 06/28/23 0353 06/29/23 0525 06/29/23 0526 06/30/23 0458 07/01/23 1330 07/01/23 1812 07/01/23 1817 07/01/23 2203 07/02/23 0015  NA 152*   < >  --    < > 147*  --  152* 143 147* 146* 145 147* 144  K 4.0   < >  --    < > 3.5  --  3.9 3.1* 4.5 4.6 4.5 4.4 4.8  CL 114*   < >  --    < > 111  --  114* 111 108  --  111  --  111  CO2 29   < >  --    < > 30  --  27 27 27   --  22  --  26  GLUCOSE 178*   < >  --    < > 180*  --  131* 150* 183*  --  188*  --  160*  BUN 65*   < >  --    < > 75*  --  58*  44* 39*  --  41*  --  48*  CREATININE 1.15   < >  --    < > 0.98  --  0.94 0.87 1.06  --  1.00  --  1.25*  CALCIUM 9.7   < >  --    < > 10.2  --  10.2 8.6* 9.3  --  8.8*  --  9.3  MG  --   --  2.5*  --  2.5* 2.4  --   --   --   --   --   --  2.6*  PHOS 4.8*  --   --   --   --  3.4  --   --   --   --   --   --  5.0*   < > = values in this interval not displayed.   GFR: Estimated Creatinine Clearance: 63 mL/min (A) (by C-G formula based on SCr of 1.25 mg/dL (H)). Recent Labs  Lab 06/29/23 0526 07/01/23 0500 07/01/23 1817 07/01/23 1825 07/02/23 0015  WBC 10.2 17.3* 21.8*  --  25.0*  LATICACIDVEN  --   --   --  1.9  --     Liver Function Tests: Recent Labs  Lab 06/28/23 0353 06/29/23 0526 06/30/23 0458 07/01/23 1330 07/01/23 1817  AST 65* 108* 55* 48* 49*  ALT 44 74* 48* 38 37  ALKPHOS 68 90 69 72 74  BILITOT 0.9 1.1 0.9 1.8* 1.7*  PROT 6.4* 6.6 5.7* 6.1* 6.2*  ALBUMIN 2.6* 2.7* 2.4* 2.5* 2.4*   Recent Labs  Lab 07/01/23 1817  LIPASE 26   No results for input(s): "AMMONIA" in the last 168 hours.  ABG    Component Value Date/Time   PHART 7.36 07/02/2023 0145   PCO2ART 46 07/02/2023 0145   PO2ART 120 (H) 07/02/2023 0145   HCO3 26.6 07/02/2023 0145   TCO2 27 07/01/2023 2203   ACIDBASEDEF 2.0 07/01/2023 2203   O2SAT 99.5 07/02/2023 0145     Coagulation Profile: Recent Labs  Lab 07/01/23 1330  INR 1.9*    Cardiac Enzymes: No results for input(s): "CKTOTAL", "CKMB", "CKMBINDEX", "TROPONINI" in the last 168 hours.  HbA1C: Hgb A1c MFr Bld  Date/Time Value Ref Range Status  06/30/2023 04:58 AM 4.1 (L) 4.8 - 5.6 % Final    Comment:    (NOTE) Pre diabetes:          5.7%-6.4%  Diabetes:              >6.4%  Glycemic control for   <7.0% adults with diabetes   12/09/2015 06:30 PM 5.1 4.8 - 5.6 % Final    Comment:    (NOTE)         Pre-diabetes: 5.7 - 6.4         Diabetes: >6.4         Glycemic control for adults with diabetes: <7.0     CBG: Recent  Labs  Lab 06/28/23 1110 06/28/23 1530 07/02/23 0005 07/02/23 0336 07/02/23 0722  GLUCAP 147* 158* 141* 135* 125*    Review of Systems:   Unable to be obtained due to mental status  Past Medical History:  He,  has a past medical history of Anxiety, Cancer (HCC), Depression, Hypertension, and OCD (obsessive compulsive disorder).   Surgical History:   Past Surgical History:  Procedure Laterality Date   CHOLECYSTECTOMY     COLON SURGERY     cancerous polyp removed   HERNIA REPAIR     IR GASTROSTOMY TUBE MOD SED  06/24/2023     Social History:   reports that he has never smoked. He has never used smokeless tobacco. He reports that he does not drink alcohol and does not use drugs.   Family History:  His family history is not on file.   Allergies Allergies  Allergen Reactions   Codeine     "sends me into orbit"     Home Medications  Prior to Admission medications   Medication Sig Start Date End Date Taking? Authorizing Provider  acetaminophen (TYLENOL) 325 MG tablet Take 650 mg by mouth every 6 (six) hours as needed for moderate pain.  [provider]  amiodarone (PACERONE) 200 MG tablet Place 1 tablet (200 mg total) into feeding tube 2 (two) times daily for 4 days, THEN 1 tablet (200 mg total) daily. 06/28/23 08/01/23  Rhetta Mura, MD  apixaban (ELIQUIS) 5 MG TABS tablet Place 1 tablet (5 mg total) into feeding tube 2 (two) times daily. 06/28/23   Rhetta Mura, MD  aspirin EC 81 MG tablet Take 81 mg by mouth daily. Swallow whole.    [provider]  budesonide (PULMICORT) 0.5 MG/2ML nebulizer solution Take 0.5 mg by nebulization 2 (two) times daily.    [provider]  feeding supplement (OSMOLITE 1 CAL) LIQD Take 1,000 mLs by mouth daily. 65 mL/hr 06/28/23   Rhetta Mura, MD  ipratropium-albuterol (DUONEB) 0.5-2.5 (3) MG/3ML SOLN Take 3 mLs by nebulization every 6 (six) hours.    [provider]  leptospermum  manuka honey (MEDIHONEY) PSTE paste Apply 1 Application topically daily. 06/29/23   Rhetta Mura, MD  loperamide HCl (IMODIUM) 1 MG/7.5ML suspension Place 15 mLs (2 mg total) into feeding tube every 8 (eight) hours. 06/28/23   Rhetta Mura, MD  LORazepam (ATIVAN) 0.5 MG tablet Take 1 tablet (0.5 mg total) by mouth in the morning. 06/28/23   Rhetta Mura, MD  metoprolol tartrate (LOPRESSOR) 25 MG tablet Place 0.5 tablets (12.5 mg total) into feeding tube 2 (two) times daily. 06/28/23   Rhetta Mura, MD  Multiple Vitamins-Minerals (MULTIVITAL PO) Take 5 mLs by mouth in the morning and at bedtime.    [provider]  Nystatin (GERHARDT'S BUTT CREAM) CREA Apply 1 Application topically 2 (two) times daily. 06/28/23   Rhetta Mura, MD  omeprazole (PRILOSEC OTC) 20 MG tablet Take 1 tablet (20 mg total) by mouth daily. 06/28/23 06/27/24  Rhetta Mura, MD  QUEtiapine (SEROQUEL) 25 MG tablet Place 1 tablet (25 mg total) into feeding tube 2 (two) times daily. 06/28/23   Rhetta Mura, MD  Water For Irrigation, Sterile (FREE WATER) SOLN Place 200 mLs into feeding tube every 6 (six) hours. 06/28/23   Rhetta Mura, MD     Critical care time: 42 min.     Joneen Roach, AGACNP-BC [facility]  See Amion for personal pager PCCM on call pager [phone] until 7pm. Please call Elink 7p-7a. [phone]  07/02/2023 9:02 AM

## 2023-07-02 NOTE — Progress Notes (Signed)
EEG complete - results pending 

## 2023-07-02 NOTE — Progress Notes (Signed)
Seen by Dr. Azucena Cecil early this morning. CT abd/pelvis w/ enteral contrast negative for gastric or enteric leak - the contrast is visible in his stomach, small bowel, and colon to the level of his rectum without extravasation. PEG tube appears to be in appropriate position. RN recently decreased patients sedation and he is opening his eyes and responding to pain. Abdominal exam is benign - his abdomen is soft and non-tender, which would also argue against gastric or intestinal perforation. No role for emergent surgical intervention. Ok for meds per G tube.   Currently intubated for respiratory failure. He underwent tracheostomy 10/6 by Dr. Merrily Pew. He developed a tracheostomy leak requiring re-intubation and then trach revision on 10/18. Defer trach mgmt to primary team, consider ENT consult in anticipation of difficult trach.    Hosie Spangle, PA-C [facility] Please see Amion for pager number during day hours 7:00am-4:30pm

## 2023-07-02 NOTE — Progress Notes (Signed)
eLink Physician-Brief Progress Note Patient Name: Joshua Eaton DOB: 01/09/1956 MRN: [id number]   Date of Service  07/02/2023  HPI/Events of Note  Hypotension/new pt eval  eICU Interventions  IVF/pressors   67  Recent hsp S/p trach/peg  Acute hypoxemic resp failure Shock Questionable intra abdominal extraliminal gas Seen by surgery CT with enteral contrast ordered Now shock ARDS  CT chest: Bilatlat mixed alveolar and interstitial infiltrates  Pressors IVF Mechanical vent   Intervention Category Major Interventions: Shock - evaluation and management  Massie Mees 07/02/2023, 12:25 AM

## 2023-07-02 NOTE — Progress Notes (Addendum)
Initial Nutrition Assessment  DOCUMENTATION CODES:   Non-severe (moderate) malnutrition in context of chronic illness  INTERVENTION:   Initiate tube feeding via PEG: Jevity 1.5 at 65 ml/h (1560 ml per day) Prosource TF20 60 ml once daily  Provides 2420 kcal, 120 gm protein, 1186 ml free water daily.  Free water flushes 150 ml every 4 hours for a total of 2086 ml free water daily. May need to increase free water flushes to meet hydration needs once IVF are stopped.   Juven BID via tube, each packet provides 80 calories, 8 grams of carbohydrate, 2.5  grams of protein (collagen), 7 grams of L-arginine and 7 grams of L-glutamine; supplement contains CaHMB, Vitamins C, E, B12 and Zinc to promote wound healing  MVI with minerals daily via tube.   NUTRITION DIAGNOSIS:   Moderate Malnutrition related to chronic illness (chronic respiratory failure) as evidenced by mild fat depletion, moderate muscle depletion, severe muscle depletion.  GOAL:   Patient will meet greater than or equal to 90% of their needs  MONITOR:   Vent status, TF tolerance  REASON FOR ASSESSMENT:   Ventilator    ASSESSMENT:   67 yo male admitted with acute on chronic respiratory failure. PMH includes tracheostomy dislodgement 10/28, PEG, HTN, a fib, DVT, mitral regurgitation with mitral valve perforation, OCD.  Patient recently discharged to [facility] with trach and PEG. Janina Mayo was dislodged 10/28 and left out at [facility], then patient developed respiratory distress, was admitted to the hospital and required reintubation.   Concern for bowel perforation noted, but CT abdomen/pelvis showed no gastric or enteric leak. Per discussion with Surgery, okay to resume TF via PEG.   Unsure of TF regimen at [facility]. He was receiving Jevity 1.5 via PEG prior to transfer to the [facility].   Patient is currently intubated on ventilator support. MV: 15.4 L/min Temp (24hrs), Avg:98.4 F (36.9 C), Min:98.2 F (36.8 C), Max:98.6 F (37  C)   Labs reviewed. Phos 5, mag 2.6 CBG: [phone]  Medications reviewed and include colace, novolog SSI q4h, medihoney, miralax, fentanyl, levophed. Propofol has been discontinued.  Patient meets criteria for moderate malnutrition, given moderate-severe depletion of muscle mass and mild depletion of subcutaneous fat mass.  NUTRITION - FOCUSED PHYSICAL EXAM:  Flowsheet Row Most Recent Value  Orbital Region Mild depletion  Upper Arm Region Mild depletion  Thoracic and Lumbar Region No depletion  Buccal Region Mild depletion  Temple Region Severe depletion  Clavicle Bone Region Severe depletion  Clavicle and Acromion Bone Region Moderate depletion  Scapular Bone Region Moderate depletion  Dorsal Hand Unable to assess  Patellar Region Severe depletion  Anterior Thigh Region Severe depletion  Posterior Calf Region Severe depletion  Edema (RD Assessment) Mild  Hair Reviewed  Eyes Reviewed  Mouth Unable to assess  Skin Reviewed  Nails Reviewed       Diet Order:   Diet Order             Diet NPO time specified  Diet effective now                   EDUCATION NEEDS:   No education needs have been identified at this time  Skin:  Skin Assessment: Skin Integrity Issues: Skin Integrity Issues:: Stage III, Stage II, Other (Comment) Stage II: anus Stage III: sacrum Other: MASD sacrum  Last BM:  11/1  Height:   Ht Readings from Last 1 Encounters:  07/02/23 6' 0.99" (1.854 m)    Weight:  Wt Readings from Last 1 Encounters:  07/02/23 87.2 kg    Ideal Body Weight:  83.6 kg  BMI:  Body mass index is 25.37 kg/m.  Estimated Nutritional Needs:   Kcal:  2200-2400  Protein:  120-140 gm  Fluid:  2.2-2.4 L   Gabriel Rainwater RD, LDN, CNSC Please refer to Amion for contact information.

## 2023-07-03 DIAGNOSIS — R569 Unspecified convulsions: Secondary | ICD-10-CM | POA: Diagnosis not present

## 2023-07-03 DIAGNOSIS — J189 Pneumonia, unspecified organism: Secondary | ICD-10-CM | POA: Diagnosis not present

## 2023-07-03 DIAGNOSIS — J9602 Acute respiratory failure with hypercapnia: Secondary | ICD-10-CM | POA: Diagnosis not present

## 2023-07-03 DIAGNOSIS — J9601 Acute respiratory failure with hypoxia: Secondary | ICD-10-CM | POA: Diagnosis not present

## 2023-07-03 LAB — URINE CULTURE: Culture: NO GROWTH

## 2023-07-03 LAB — MRSA NEXT GEN BY PCR, NASAL: MRSA by PCR Next Gen: NOT DETECTED

## 2023-07-03 LAB — GLUCOSE, CAPILLARY
Glucose-Capillary: 98 mg/dL (ref 70–99)
Glucose-Capillary: 119 mg/dL — ABNORMAL HIGH (ref 70–99)
Glucose-Capillary: 120 mg/dL — ABNORMAL HIGH (ref 70–99)
Glucose-Capillary: 122 mg/dL — ABNORMAL HIGH (ref 70–99)
Glucose-Capillary: 126 mg/dL — ABNORMAL HIGH (ref 70–99)
Glucose-Capillary: 114 mg/dL — ABNORMAL HIGH (ref 70–99)

## 2023-07-03 LAB — BASIC METABOLIC PANEL
Anion gap: 8 (ref 5–15)
BUN: 58 mg/dL — ABNORMAL HIGH (ref 8–23)
CO2: 28 mmol/L (ref 22–32)
Calcium: 8.4 mg/dL — ABNORMAL LOW (ref 8.9–10.3)
Chloride: 106 mmol/L (ref 98–111)
Creatinine, Ser: 1.5 mg/dL — ABNORMAL HIGH (ref 0.61–1.24)
GFR, Estimated: 50 mL/min — ABNORMAL LOW (ref >60)
Glucose, Bld: 112 mg/dL — ABNORMAL HIGH (ref 70–99)
Potassium: 4 mmol/L (ref 3.5–5.1)
Sodium: 142 mmol/L (ref 135–145)

## 2023-07-03 LAB — PHOSPHORUS: Phosphorus: 5.1 mg/dL — ABNORMAL HIGH (ref 2.5–4.6)

## 2023-07-03 LAB — MAGNESIUM: Magnesium: 2.7 mg/dL — ABNORMAL HIGH (ref 1.7–2.4)

## 2023-07-03 MED ORDER — FENTANYL CITRATE PF 50 MCG/ML IJ SOSY
100.0000 ug | PREFILLED_SYRINGE | Freq: Once | INTRAMUSCULAR | Status: AC
  Administered 2023-07-03: 100 ug via INTRAVENOUS

## 2023-07-03 MED ORDER — FUROSEMIDE 10 MG/ML IJ SOLN
40.0000 mg | Freq: Two times a day (BID) | INTRAMUSCULAR | Status: DC
Start: 2023-07-03 — End: 2023-07-04
  Administered 2023-07-03 (×2): 40 mg via INTRAVENOUS
  Filled 2023-07-03 (×2): qty 4

## 2023-07-03 MED ORDER — CHLORHEXIDINE GLUCONATE CLOTH 2 % EX PADS
6.0000 | MEDICATED_PAD | Freq: Every day | CUTANEOUS | Status: DC
  Administered 2023-07-04 – 2023-07-13 (×10): 6 via TOPICAL

## 2023-07-03 MED ORDER — MIDAZOLAM HCL 2 MG/2ML IJ SOLN: 2.0000 mg | Freq: Once | INTRAMUSCULAR | Status: AC

## 2023-07-03 MED ORDER — VANCOMYCIN HCL 2000 MG/400ML IV SOLN
2000.0000 mg | Freq: Once | INTRAVENOUS | Status: AC
Start: 2023-07-03 — End: 2023-07-03
  Administered 2023-07-03: 2000 mg via INTRAVENOUS
  Filled 2023-07-03: qty 400

## 2023-07-03 MED ORDER — IPRATROPIUM-ALBUTEROL 0.5-2.5 (3) MG/3ML IN SOLN
3.0000 mL | Freq: Two times a day (BID) | RESPIRATORY_TRACT | Status: DC
  Administered 2023-07-03 – 2023-07-09 (×12): 3 mL via RESPIRATORY_TRACT
  Filled 2023-07-03 (×12): qty 3

## 2023-07-03 MED ORDER — CHLORHEXIDINE GLUCONATE CLOTH 2 % EX PADS
6.0000 | MEDICATED_PAD | Freq: Every day | CUTANEOUS | Status: DC
Start: 2023-07-03 — End: 2023-07-03

## 2023-07-03 MED ORDER — MIDAZOLAM HCL 2 MG/2ML IJ SOLN
INTRAMUSCULAR | Status: AC
  Administered 2023-07-03: 2 mg via INTRAVENOUS
  Filled 2023-07-03: qty 2

## 2023-07-03 NOTE — Progress Notes (Signed)
Subjective/Chief Complaint: No acute change   Objective: Vital signs in last 24 hours: Temp:  [97.7 F (36.5 C)-98.8 F (37.1 C)] 98.4 F (36.9 C) (11/02 0739) Pulse Rate:  [68-120] 70 (11/02 1000) Resp:  [14-42] 24 (11/02 1000) BP: (73-169)/(38-78) 132/53 (11/02 1000) SpO2:  [76 %-100 %] 100 % (11/02 1000) FiO2 (%):  [40 %-50 %] 50 % (11/02 0838) Weight:  [87.5 kg] 87.5 kg (11/02 0500) Last BM Date : 07/02/23  Intake/Output from previous day: 11/01 0701 - 11/02 0700 In: 2167.8 [I.V.:1288.3; NG/GT:615.7; IV Piggyback:263.9] Out: 1475 [Urine:1475] Intake/Output this shift: Total I/O In: 489.3 [I.V.:167.8; NG/GT:195; IV Piggyback:126.5] Out: -   Intubated, sedated Abdomen is completely benign, nondistended and nontender.  G-tube site is clean and dry with tube feeds running  Lab Results:  Recent Labs    07/01/23 1817 07/01/23 2203 07/02/23 0015  WBC 21.8*  --  25.0*  HGB 9.9* 10.2* 8.5*  HCT 31.1* 30.0* 27.2*  PLT 132*  --  185   BMET Recent Labs    07/02/23 0015 07/03/23 0332  NA 144 142  K 4.8 4.0  CL 111 106  CO2 26 28  GLUCOSE 160* 112*  BUN 48* 58*  CREATININE 1.25* 1.50*  CALCIUM 9.3 8.4*   PT/INR Recent Labs    07/01/23 1330  LABPROT 21.7*  INR 1.9*   ABG Recent Labs    07/01/23 2203 07/02/23 0145  PHART 7.259* 7.36  HCO3 25.7 26.6    Studies/Results: EEG adult  Result Date: 07/03/2023 Charlsie Quest, MD     07/03/2023  8:55 AM Patient Name: Joshua Eaton MRN: [id number] Epilepsy Attending: Charlsie Quest Referring Physician/Provider: Duayne Cal, NP Date: 07/02/2023 Duration: 27.24 mins Patient history: 67yo M with ams getting eeg to evaluate for seizure Level of alertness:  comatose AEDs during EEG study: Propofol Technical aspects: This EEG study was done with scalp electrodes positioned according to the 10-20 International system of electrode placement. Electrical activity was reviewed with band pass filter of 1-70Hz ,  sensitivity of 7 uV/mm, display speed of 70mm/sec with a 60Hz  notched filter applied as appropriate. EEG data were recorded continuously and digitally stored.  Video monitoring was available and reviewed as appropriate. Description: EEG showed continuous generalized 3 to 6 Hz theta-delta slowing admixed with 15 to 18 Hz beta activity distributed symmetrically and diffusely. Hyperventilation and photic stimulation were not performed.   ABNORMALITY - Continuous slow, generalized IMPRESSION: This study is suggestive of severe diffuse encephalopathy likely secondary to sedation. No seizures or epileptiform discharges were seen throughout the recording. Charlsie Quest   CT ABDOMEN PELVIS W CONTRAST  Result Date: 07/02/2023 CLINICAL DATA:  Peritonitis or bowel perforation suspected. EXAM: CT ABDOMEN AND PELVIS WITH CONTRAST TECHNIQUE: Multidetector CT imaging of the abdomen and pelvis was performed using the standard protocol following bolus administration of intravenous contrast. RADIATION DOSE REDUCTION: This exam was performed according to the departmental dose-optimization program which includes automated exposure control, adjustment of the mA and/or kV according to patient size and/or use of iterative reconstruction technique. CONTRAST:  75mL OMNIPAQUE IOHEXOL 350 MG/ML SOLN, AND 1 L OF AN UNSPECIFIED ORAL CONTRAST AGENT. COMPARISON:  CT chest, abdomen and pelvis with contrast yesterday at 7:49 p.m., CT abdomen and pelvis with no contrast 06/21/2023. FINDINGS: Lower chest: Interstitial and diffuse patchy ground-glass airspace disease again noted in the anterior lung bases. In the posterior lung bases there is increasingly confluent dense consolidation with air bronchograms. Findings could reflect edema  and/or pneumonia. There is mild-to-moderate cardiomegaly with a left chamber predominance. No substantial pericardial effusion. Hepatobiliary: No focal liver abnormality is seen. Status post cholecystectomy. No  biliary dilatation. Pancreas: No abnormality. Spleen: Again noted mildly enlarged, 15 cm AP.  No mass. Adrenals/Urinary Tract: Adrenal glands are unremarkable. Again noted is a Bosniak 1 parapelvic cyst in the superior pole left kidney measuring 1.6 cm. Otherwise, both kidneys are normal, without renal calculi, focal lesion, or hydronephrosis. Bladder is catheterized and partly contracted. There is contrast in the renal collecting systems and bladder, segmentally in portions of the ureters. Stomach/Bowel: PEG balloon anterior body of stomach. The stomach distended with contrast. NGT is in place the tip abutting the far wall of the body of the stomach overlying the PEG balloon. NGT has been placed since the CT from last night. No GI tract dilatation or wall thickening is seen. Right hemicolectomy again noted with primary surgical patent anastomosis. There is sigmoid diverticulosis without evidence of acute diverticulitis. Extraluminal gas continues to track along the course of the distal transverse and descending colon down to the proximal sigmoid segment and begins just below where the PEG tube enters the stomach, suggesting this may be due to the prior PEG insertion although a colonic perforation certainly is not excluded. Free air is not seen elsewhere in the abdomen or the pelvis and the appearance is not notably changed from the study done 6 hours ago. Vascular/Lymphatic: Aortic atherosclerosis. No enlarged abdominal or pelvic lymph nodes. Reproductive: Prostatomegaly, 5.3 cm transverse. Other: Right inguinal hernia repair. Small left inguinal fat hernia. Small umbilical fat hernia. There is no incarcerated hernia. No free hemorrhage, free ascites, or focal peritoneal inflammatory process is seen. Musculoskeletal: No acute or significant osseous findings. There are degenerative changes and slight levoscoliosis of the lumbar spine. IMPRESSION: 1. Extraluminal gas continues to track along the course of the distal  transverse and descending colon down to the proximal sigmoid segment and begins just below where the PEG tube enters the stomach, suggesting this may be due to the prior PEG insertion although a colonic perforation certainly is not excluded. No appreciable change from 6 hours ago. 2. NGT tip abuts the far wall of the body of the stomach overlying the PEG balloon. 3. Old right hemicolectomy with patent primary surgical anastomosis. 4. Sigmoid diverticulosis without evidence of acute diverticulitis. 5. Cardiomegaly. 6. Interstitial and diffuse patchy ground-glass airspace disease in the anterior lung bases. 7. Increasingly confluent dense consolidation with air bronchograms in the posterior lung bases. Findings could reflect edema and/or pneumonia. 8. Aortic atherosclerosis. 9. Prostatomegaly. 10. Small left inguinal and umbilical fat hernias. Aortic Atherosclerosis (ICD10-I70.0). Electronically Signed   By: Almira Bar M.D.   On: 07/02/2023 04:11   DG Chest Portable 1 View  Result Date: 07/01/2023 CLINICAL DATA:  Intubation. EXAM: PORTABLE CHEST 1 VIEW COMPARISON:  Chest radiograph dated 07/01/2023. FINDINGS: Interval placement of an endotracheal tube with tip approximately 2.5 cm above the carina. Enteric tube extends below the diaphragm with tip in the epigastric area. Bilateral pulmonary opacities similar or slightly progressed since the earlier radiograph. No large pleural effusion or pneumothorax. Stable cardiomediastinal silhouette. No acute osseous pathology. IMPRESSION: 1. Endotracheal tube with tip approximately 2.5 cm above the carina. 2. Bilateral pulmonary opacities similar or slightly progressed since the earlier radiograph. Electronically Signed   By: Elgie Collard M.D.   On: 07/01/2023 22:43   CT CHEST ABDOMEN PELVIS W CONTRAST  Result Date: 07/01/2023 CLINICAL DATA:  Acute nonlocalized abdominal  pain. Respiratory failure. Suspicion of intramural air on abdominal radiograph. EXAM: CT  CHEST, ABDOMEN, AND PELVIS WITH CONTRAST TECHNIQUE: Multidetector CT imaging of the chest, abdomen and pelvis was performed following the standard protocol during bolus administration of intravenous contrast. RADIATION DOSE REDUCTION: This exam was performed according to the departmental dose-optimization program which includes automated exposure control, adjustment of the mA and/or kV according to patient size and/or use of iterative reconstruction technique. CONTRAST:  75mL OMNIPAQUE IOHEXOL 350 MG/ML SOLN COMPARISON:  Chest and abdominal radiograph 07/01/2023. CT abdomen and pelvis 06/21/2023. CT chest 06/15/2023 FINDINGS: CT CHEST FINDINGS Cardiovascular: Normal heart size. No pericardial effusions. Normal caliber thoracic aorta. Calcification of the aorta. Mediastinum/Nodes: Esophagus is decompressed. Thyroid gland is unremarkable. No significant lymphadenopathy. Lungs/Pleura: Diffuse airspace disease throughout the lungs likely representing pulmonary edema although aspiration or severe multifocal pneumonia could also have this appearance. No pleural effusions. No pneumothorax. Musculoskeletal: No chest wall mass or suspicious bone lesions identified. CT ABDOMEN PELVIS FINDINGS Hepatobiliary: No focal liver abnormality is seen. Status post cholecystectomy. No biliary dilatation. Pancreas: Unremarkable. No pancreatic ductal dilatation or surrounding inflammatory changes. Spleen: Normal in size without focal abnormality. Adrenals/Urinary Tract: Adrenal glands are unremarkable. Kidneys are normal, without renal calculi, focal lesion, or hydronephrosis. Foley catheter is in the bladder. Residual contrast material in the renal collecting systems, ureters, and bladder. Stomach/Bowel: Stomach, small bowel, and colon are not abnormally distended. There is a gastrostomy tube positioned along the greater curvature of the stomach. The retention balloon is tenting of the gastric wall but is likely within the stomach. There  is residual contrast material in the colon. There is extraluminal gas tracking along the hepatic flexure of the colon and extending along the descending colon. This is likely related to recent gastrostomy tube placement and could indicate colonic injury. Ischemia is less likely to have this appearance given the appearance and distribution of the gas. No focal wall thickening is identified. Vascular/Lymphatic: Aortic atherosclerosis. No enlarged abdominal or pelvic lymph nodes. Reproductive: Prostate is unremarkable. Other: No free intraperitoneal air. No free fluid. Postoperative right inguinal mesh hernia repair. Small periumbilical hernia containing fat. Musculoskeletal: No acute or significant osseous findings. IMPRESSION: 1. Percutaneous gastrostomy tube appears in place along the greater curvature of the stomach, tenting of the gastric wall. 2. Extraluminal pericolonic gas along the splenic flexure and descending colon. This appears to be extramural gas rather than pneumatosis. This is likely related to the prior procedure in may indicate colonic injury with extraperitoneal gas along the colon. 3. No free air or free fluid in the intraperitoneal spaces. 4. Diffuse airspace disease throughout both lungs most likely edema but could indicate severe pneumonia, aspiration, or ARDS. These results were called by telephone at the time of interpretation on 07/01/2023 at 9:35 pm to provider [facility] , who verbally acknowledged these results. Electronically Signed   By: Burman Nieves M.D.   On: 07/01/2023 21:45   DG Chest Portable 1 View  Result Date: 07/01/2023 CLINICAL DATA:  Altered mental status. EXAM: PORTABLE CHEST 1 VIEW COMPARISON:  Chest radiograph dated 07/01/2023. FINDINGS: Slight interval improvement in bilateral pulmonary opacities since the earlier radiograph. No pleural effusion or pneumothorax. Stable cardiomediastinal silhouette. No acute osseous pathology. IMPRESSION: Slight interval  improvement in bilateral pulmonary opacities. Electronically Signed   By: Elgie Collard M.D.   On: 07/01/2023 21:30    Anti-infectives: Anti-infectives (From admission, onward)    Start     Dose/Rate Route Frequency Ordered Stop   07/03/23  0930  vancomycin (VANCOREADY) IVPB 2000 mg/400 mL        2,000 mg 200 mL/hr over 120 Minutes Intravenous  Once 07/03/23 0836     07/01/23 2330  piperacillin-tazobactam (ZOSYN) IVPB 3.375 g        3.375 g 12.5 mL/hr over 240 Minutes Intravenous Every 8 hours 07/01/23 2318     07/01/23 2045  piperacillin-tazobactam (ZOSYN) IVPB 3.375 g  Status:  Discontinued        3.375 g 100 mL/hr over 30 Minutes Intravenous  Once 07/01/23 2039 07/01/23 2318       Assessment/Plan:  67 yo M with complex medical history, most recently respiratory failure and recent PEG with concern on imaging for possible colonic injury from PEG. No evidence on imaging or physical exam to suggest colonic injury or other complication of his PEG.  Okay to continue tube feeds.  Surgery team will sign off.     LOS: 2 days    Berna Bue 07/03/2023

## 2023-07-03 NOTE — Progress Notes (Addendum)
NAME:  Joshua Eaton, MRN:  [id number], DOB:  1956-05-12, LOS: 2 ADMISSION DATE:  07/01/2023, CONSULTATION DATE:  10/31 REFERRING MD:  Shearon Balo, CHIEF COMPLAINT:  A on C respiratory failure   History of Present Illness:  Joshua Eaton is a 67 y/o gentleman with a history of respiratory failure and tracheostomy with recent admission from 10/3-10/28 at [facility] after a 2 week admission at [facility] in September for pneumonia complicated by ARDS and acute cardiogenic pulmonary edema. He has severe MR recently diagnosed and is not a candidate for valve replacement due to overall frailty. He underwent tracheostomy and PEG placement during his admission and was transferred to [facility] on 10/28. Later that day he had an accidental tracheostomy dislodgement, and his trach stayed out. Yesterday he developed increased work of breathing and was transferred to the ED for further evaluation. He was trialed on BiPAP but did not tolerate it well and eventually required reintubation in the ED. He underwent CT scan demonstrating possible contained bowel perforation; Ct with enteral contrast has now been ordered and surgery has evaluated him. PCCM was consulted for admission.   Pertinent  Medical History  HTN Respiratory failure requiring trach Afib DVT- provoked by PICC  Significant Hospital Events: Including procedures, antibiotic start and stop dates in addition to other pertinent events   10/3 initially admitted 10/6 trach 10/9 TEE with severe MR with mitral valve perforation 10/11 cardiothoracic surgery, not a surgical candidate  10/18 trach revision after cuff leak> required reintubation to stabilize 10/24 PEG placement 10/28 discharged to [facility], dislodged trach, which was then left out. 10/31 readmitted for respiratory failure,possible contained bowel perforation 10/31 CT chest/abdomen/pelvis shows diffuse airspace disease 11/1 CT abd/pelvis w/ enteral contrast negative for gastric or enteric leak - the  contrast is visible in his stomach, small bowel, and colon to the level of his rectum without extravasation.   Interim History / Subjective:   Afebrile Critically ill, intubated, sedated on fentanyl and propofol. On 50% FiO2, tachypneic this morning 40s 1.4 L urine output  Objective   Blood pressure (!) 142/57, pulse 74, temperature 98.4 F (36.9 C), temperature source Axillary, resp. rate (!) 24, height 6' 0.99" (1.854 m), weight 87.5 kg, SpO2 100%.    Vent Mode: PRVC FiO2 (%):  [40 %-50 %] 50 % Set Rate:  [24 bmp] 24 bmp Vt Set:  [500 mL] 500 mL PEEP:  [5 cmH20] 5 cmH20 Plateau Pressure:  [11 cmH20-22 cmH20] 11 cmH20   Intake/Output Summary (Last 24 hours) at 07/03/2023 0818 Last data filed at 07/03/2023 0700 Gross per 24 hour  Intake 1886.36 ml  Output 1285 ml  Net 601.36 ml   Filed Weights   07/02/23 0500 07/03/23 0500  Weight: 87.2 kg 87.5 kg    Examination: General: thin adult male in NAD HENT: [place]/AT, PERRL. Dried blood R nare.  Lungs: Tachypneic, bilateral ventilated breath sounds Cardiovascular: RRR, 4/6 murmur Abdomen: Soft, NT, ND Extremities: No acute deformity or edema.  Neuro: Unresponsive, RASS -3 GU: foley with amber urine   CT> diffuse GG throughout both lungs, free air adjacent to hepatic flexure and descending colon. Labs from 10/31 show leukocytosis, normal lactate, high procalcitonin  Resolved Hospital Problem list     Assessment & Plan:  Acute respiratory failure with hypoxia & hypercapnia ARDS- unclear if pneumonia vs edema from mitral regurgitation Recent tracheostomy dependence -6cc/kg vent support -VAP prevention protocol -PAD protocol for sedation -Lasix 40 every 12 -hold Eliquis; Dr. Chestine Spore discussed with his wife that  he may need repeat trach revision (this would be his second). May need to involve ENT.  -Zosyn and vanc, follow respiratory culture -Family refusing retrach at this time, if agreeable will need ENT  Severe MR with  leaflet perforation, preserved LV function -not a candidate for surgery currently -Will reinvolve cardiology, his overall status would need to improve to be considered for valve surgery  Moderate AI  -prevent high afterload  Anemia due to critical illness  -transfuse for Hb <7 or hemodynamically significant bleeding -monitor  RUE DVT provoked by PICC -holding AC due to hemoptysis, potential need for procedures -needs 3 months for provoked clot  Afib, paroxymsal -amiodarone continue -holding eliquis for now, will start IV heparin tomorrow  Thrombocytopenia -no current indication for transfusions  Hyperglycemia -SSI PRN -goal BG 140-180  ICU delirium -hold seroquel for now -PAD protocol -Propofol and fentanyl for RASS goal 0 to -1.   Deconditioning, debility Severe protein energy malnutrition No evidence of bowel perforation -will need enteral TF restarted as soon as safe. Meds ok via tube per surgery for now.   BPH -unable to take tamsulosin until able to take pills -Foley  Perineal moisture associated skin breakdown -Foley, flexiseal   Summary -family did not want repeat tracheostomy.  They requested transfer.  We have contacted [facility] who has refused transfer.  He is not a candidate for valve surgery given critical illness.  Best course is to redo trach and continue rehab efforts in the hopes that he will liberate from the vent and be a candidate. Will reinvolve cardiology to assist  Best Practice (right click and "Reselect all SmartList Selections" daily)   Diet/type: NPO DVT prophylaxis: SCD GI prophylaxis: PPI Lines: N/A Foley:  Yes, and it is still needed Code Status:  full code Last date of multidisciplinary goals of care discussion [in ED]  Labs   CBC: Recent Labs  Lab 06/27/23 0747 06/29/23 0526 07/01/23 0500 07/01/23 1812 07/01/23 1817 07/01/23 2203 07/02/23 0015  WBC 10.1 10.2 17.3*  --  21.8*  --  25.0*  NEUTROABS 6.7 6.4 13.8*  --  19.6*   --   --   HGB 10.6* 11.2* 12.0* 9.9* 9.9* 10.2* 8.5*  HCT 34.1* 35.5* 37.2* 29.0* 31.1* 30.0* 27.2*  MCV 102.7* 101.1* 101.9*  --  101.3*  --  103.0*  PLT 147* 166 143*  --  132*  --  185    Basic Metabolic Panel: Recent Labs  Lab 06/28/23 0353 06/29/23 0525 06/29/23 0526 06/30/23 0458 07/01/23 1330 07/01/23 1812 07/01/23 1817 07/01/23 2203 07/02/23 0015 07/02/23 1735 07/03/23 0332  NA 147*  --  152* 143 147* 146* 145 147* 144  --   --   K 3.5  --  3.9 3.1* 4.5 4.6 4.5 4.4 4.8  --   --   CL 111  --  114* 111 108  --  111  --  111  --   --   CO2 30  --  27 27 27   --  22  --  26  --   --   GLUCOSE 180*  --  131* 150* 183*  --  188*  --  160*  --   --   BUN 75*  --  58* 44* 39*  --  41*  --  48*  --   --   CREATININE 0.98  --  0.94 0.87 1.06  --  1.00  --  1.25*  --   --   CALCIUM  10.2  --  10.2 8.6* 9.3  --  8.8*  --  9.3  --   --   MG 2.5* 2.4  --   --   --   --   --   --  2.6* 2.4 2.7*  PHOS  --  3.4  --   --   --   --   --   --  5.0* 4.6 5.1*   GFR: Estimated Creatinine Clearance: 63 mL/min (A) (by C-G formula based on SCr of 1.25 mg/dL (H)). Recent Labs  Lab 06/29/23 0526 07/01/23 0500 07/01/23 1817 07/01/23 1825 07/02/23 0015  PROCALCITON  --   --   --   --  2.91  WBC 10.2 17.3* 21.8*  --  25.0*  LATICACIDVEN  --   --   --  1.9  --     Liver Function Tests: Recent Labs  Lab 06/28/23 0353 06/29/23 0526 06/30/23 0458 07/01/23 1330 07/01/23 1817  AST 65* 108* 55* 48* 49*  ALT 44 74* 48* 38 37  ALKPHOS 68 90 69 72 74  BILITOT 0.9 1.1 0.9 1.8* 1.7*  PROT 6.4* 6.6 5.7* 6.1* 6.2*  ALBUMIN 2.6* 2.7* 2.4* 2.5* 2.4*   Recent Labs  Lab 07/01/23 1817  LIPASE 26   No results for input(s): "AMMONIA" in the last 168 hours.  ABG    Component Value Date/Time   PHART 7.36 07/02/2023 0145   PCO2ART 46 07/02/2023 0145   PO2ART 120 (H) 07/02/2023 0145   HCO3 26.6 07/02/2023 0145   TCO2 27 07/01/2023 2203   ACIDBASEDEF 2.0 07/01/2023 2203   O2SAT 99.5  07/02/2023 0145     Coagulation Profile: Recent Labs  Lab 07/01/23 1330  INR 1.9*    Cardiac Enzymes: No results for input(s): "CKTOTAL", "CKMB", "CKMBINDEX", "TROPONINI" in the last 168 hours.  HbA1C: Hgb A1c MFr Bld  Date/Time Value Ref Range Status  06/30/2023 04:58 AM 4.1 (L) 4.8 - 5.6 % Final    Comment:    (NOTE) Pre diabetes:          5.7%-6.4%  Diabetes:              >6.4%  Glycemic control for   <7.0% adults with diabetes   12/09/2015 06:30 PM 5.1 4.8 - 5.6 % Final    Comment:    (NOTE)         Pre-diabetes: 5.7 - 6.4         Diabetes: >6.4         Glycemic control for adults with diabetes: <7.0     CBG: Recent Labs  Lab 07/02/23 1530 07/02/23 1906 07/02/23 2313 07/03/23 0327 07/03/23 0731  GLUCAP 100* 126* 133* 98 119*   Critical care time: 40 min.     Cyril Mourning MD. Tonny Bollman. [facility] Pager : [phone]  If no response to pager , please call [phone] until 7 pm After 7:00 pm call Elink  [phone]     07/03/2023 8:18 AM   Updated wife to plan of care. EEG negative for seizure She is now agreeable to tracheostomy We will plan for Monday  Agueda Houpt V. Vassie Loll MD

## 2023-07-03 NOTE — Procedures (Signed)
Patient Name: Joshua Eaton  MRN: [id number]  Epilepsy Attending: Charlsie Quest  Referring Physician/Provider: Duayne Cal, NP  Date: 07/02/2023 Duration: 27.24 mins  Patient history: 67yo M with ams getting eeg to evaluate for seizure  Level of alertness:  comatose  AEDs during EEG study: Propofol  Technical aspects: This EEG study was done with scalp electrodes positioned according to the 10-20 International system of electrode placement. Electrical activity was reviewed with band pass filter of 1-70Hz , sensitivity of 7 uV/mm, display speed of 73mm/sec with a 60Hz  notched filter applied as appropriate. EEG data were recorded continuously and digitally stored.  Video monitoring was available and reviewed as appropriate.  Description: EEG showed continuous generalized 3 to 6 Hz theta-delta slowing admixed with 15 to 18 Hz beta activity distributed symmetrically and diffusely. Hyperventilation and photic stimulation were not performed.     ABNORMALITY - Continuous slow, generalized  IMPRESSION: This study is suggestive of severe diffuse encephalopathy likely secondary to sedation. No seizures or epileptiform discharges were seen throughout the recording.  Shiela Bruns Annabelle Harman

## 2023-07-04 ENCOUNTER — Inpatient Hospital Stay (HOSPITAL_COMMUNITY): Payer: Medicare Other

## 2023-07-04 DIAGNOSIS — J9602 Acute respiratory failure with hypercapnia: Secondary | ICD-10-CM | POA: Diagnosis not present

## 2023-07-04 DIAGNOSIS — I5031 Acute diastolic (congestive) heart failure: Secondary | ICD-10-CM

## 2023-07-04 DIAGNOSIS — J9601 Acute respiratory failure with hypoxia: Secondary | ICD-10-CM | POA: Diagnosis not present

## 2023-07-04 LAB — CBC WITH DIFFERENTIAL/PLATELET
Abs Immature Granulocytes: 0.23 10*3/uL — ABNORMAL HIGH (ref 0.00–0.07)
Basophils Absolute: 0.1 10*3/uL (ref 0.0–0.1)
Basophils Relative: 1 %
Eosinophils Absolute: 0.5 10*3/uL (ref 0.0–0.5)
Eosinophils Relative: 5 %
HCT: 23 % — ABNORMAL LOW (ref 39.0–52.0)
Hemoglobin: 7.1 g/dL — ABNORMAL LOW (ref 13.0–17.0)
Immature Granulocytes: 2 %
Lymphocytes Relative: 21 %
Lymphs Abs: 2 10*3/uL (ref 0.7–4.0)
MCH: 32.3 pg (ref 26.0–34.0)
MCHC: 30.9 g/dL (ref 30.0–36.0)
MCV: 104.5 fL — ABNORMAL HIGH (ref 80.0–100.0)
Monocytes Absolute: 0.6 10*3/uL (ref 0.1–1.0)
Monocytes Relative: 6 %
Neutro Abs: 6.2 10*3/uL (ref 1.7–7.7)
Neutrophils Relative %: 65 %
Platelets: 121 10*3/uL — ABNORMAL LOW (ref 150–400)
RBC: 2.2 MIL/uL — ABNORMAL LOW (ref 4.22–5.81)
RDW: 17.8 % — ABNORMAL HIGH (ref 11.5–15.5)
WBC: 9.6 10*3/uL (ref 4.0–10.5)
nRBC: 0.8 % — ABNORMAL HIGH (ref 0.0–0.2)

## 2023-07-04 LAB — CBC
HCT: 22.5 % — ABNORMAL LOW (ref 39.0–52.0)
Hemoglobin: 7.1 g/dL — ABNORMAL LOW (ref 13.0–17.0)
MCH: 33.3 pg (ref 26.0–34.0)
MCHC: 31.6 g/dL (ref 30.0–36.0)
MCV: 105.6 fL — ABNORMAL HIGH (ref 80.0–100.0)
Platelets: 126 10*3/uL — ABNORMAL LOW (ref 150–400)
RBC: 2.13 MIL/uL — ABNORMAL LOW (ref 4.22–5.81)
RDW: 17.8 % — ABNORMAL HIGH (ref 11.5–15.5)
WBC: 10.1 10*3/uL (ref 4.0–10.5)
nRBC: 0.8 % — ABNORMAL HIGH (ref 0.0–0.2)

## 2023-07-04 LAB — PHOSPHORUS: Phosphorus: 4.3 mg/dL (ref 2.5–4.6)

## 2023-07-04 LAB — BASIC METABOLIC PANEL
Anion gap: 10 (ref 5–15)
BUN: 81 mg/dL — ABNORMAL HIGH (ref 8–23)
CO2: 28 mmol/L (ref 22–32)
Calcium: 8.4 mg/dL — ABNORMAL LOW (ref 8.9–10.3)
Chloride: 106 mmol/L (ref 98–111)
Creatinine, Ser: 1.54 mg/dL — ABNORMAL HIGH (ref 0.61–1.24)
GFR, Estimated: 49 mL/min — ABNORMAL LOW (ref >60)
Glucose, Bld: 135 mg/dL — ABNORMAL HIGH (ref 70–99)
Potassium: 3.6 mmol/L (ref 3.5–5.1)
Sodium: 144 mmol/L (ref 135–145)

## 2023-07-04 LAB — GLUCOSE, CAPILLARY
Glucose-Capillary: 135 mg/dL — ABNORMAL HIGH (ref 70–99)
Glucose-Capillary: 116 mg/dL — ABNORMAL HIGH (ref 70–99)
Glucose-Capillary: 139 mg/dL — ABNORMAL HIGH (ref 70–99)
Glucose-Capillary: 133 mg/dL — ABNORMAL HIGH (ref 70–99)
Glucose-Capillary: 129 mg/dL — ABNORMAL HIGH (ref 70–99)
Glucose-Capillary: 138 mg/dL — ABNORMAL HIGH (ref 70–99)

## 2023-07-04 LAB — MAGNESIUM: Magnesium: 2.8 mg/dL — ABNORMAL HIGH (ref 1.7–2.4)

## 2023-07-04 MED ORDER — QUETIAPINE FUMARATE 25 MG PO TABS
25.0000 mg | ORAL_TABLET | Freq: Two times a day (BID) | ORAL | Status: DC
  Administered 2023-07-04 – 2023-07-05 (×3): 25 mg
  Filled 2023-07-04 (×3): qty 1

## 2023-07-04 MED ORDER — FAMOTIDINE 20 MG PO TABS
20.0000 mg | ORAL_TABLET | Freq: Two times a day (BID) | ORAL | Status: DC
  Administered 2023-07-04 – 2023-07-13 (×19): 20 mg
  Filled 2023-07-04 (×19): qty 1

## 2023-07-04 NOTE — Progress Notes (Signed)
Patient transported from 3M07 to 2M12 without incidence. RNx2, CNA assisted. Report given to receiving RT.

## 2023-07-04 NOTE — Plan of Care (Signed)
  Problem: Fluid Volume: Goal: Ability to maintain a balanced intake and output will improve Outcome: Progressing   Problem: Metabolic: Goal: Ability to maintain appropriate glucose levels will improve Outcome: Progressing   Problem: Nutritional: Goal: Maintenance of adequate nutrition will improve Outcome: Progressing   Problem: Tissue Perfusion: Goal: Adequacy of tissue perfusion will improve Outcome: Progressing   Problem: Nutrition: Goal: Adequate nutrition will be maintained Outcome: Progressing   Problem: Pain Management: Goal: General experience of comfort will improve Outcome: Not Progressing

## 2023-07-04 NOTE — Progress Notes (Signed)
NAME:  Milind Raether, MRN:  [id number], DOB:  [birth year], LOS: 3 ADMISSION DATE:  07/01/2023, CONSULTATION DATE:  10/31 REFERRING MD:  Shearon Balo, CHIEF COMPLAINT:  A on C respiratory failure   History of Present Illness:  Mr. Cayer is a 67 y/o gentleman with a history of respiratory failure and tracheostomy with recent admission from 10/3-10/28 at [facility] after a 2 week admission at [facility] in September for pneumonia complicated by ARDS and acute cardiogenic pulmonary edema. He has severe MR recently diagnosed and is not a candidate for valve replacement due to overall frailty. He underwent tracheostomy and PEG placement during his admission and was transferred to [facility] on 10/28. Later that day he had an accidental tracheostomy dislodgement, and his trach stayed out. Yesterday he developed increased work of breathing and was transferred to the ED for further evaluation. He was trialed on BiPAP but did not tolerate it well and eventually required reintubation in the ED. He underwent CT scan demonstrating possible contained bowel perforation; Ct with enteral contrast has now been ordered and surgery has evaluated him. PCCM was consulted for admission.   Pertinent  Medical History  HTN Respiratory failure requiring trach Afib DVT- provoked by PICC  Significant Hospital Events: Including procedures, antibiotic start and stop dates in addition to other pertinent events   10/3 initially admitted 10/6 trach 10/9 TEE with severe MR with mitral valve perforation 10/11 cardiothoracic surgery, not a surgical candidate  10/18 trach revision after cuff leak> required reintubation to stabilize 10/24 PEG placement 10/28 discharged to [facility], dislodged trach, which was then left out. 10/31 readmitted for respiratory failure,possible contained bowel perforation 10/31 CT chest/abdomen/pelvis shows diffuse airspace disease 11/1 CT abd/pelvis w/ enteral contrast negative for gastric or enteric leak - the  contrast is visible in his stomach, small bowel, and colon to the level of his rectum without extravasation.   Interim History / Subjective:   Critically ill, intubated and sedated on propofol and fentanyl Afebrile Good urine output with Lasix  Objective   Blood pressure (!) 141/49, pulse 73, temperature 98.6 F (37 C), temperature source Axillary, resp. rate (!) 24, height 6' 0.99" (1.854 m), weight 87.1 kg, SpO2 100%.    Vent Mode: PRVC FiO2 (%):  [40 %-50 %] 40 % Set Rate:  [24 bmp] 24 bmp Vt Set:  [500 mL] 500 mL PEEP:  [5 cmH20] 5 cmH20 Plateau Pressure:  [18 cmH20-25 cmH20] 22 cmH20   Intake/Output Summary (Last 24 hours) at 07/04/2023 0802 Last data filed at 07/04/2023 0700 Gross per 24 hour  Intake 2464.59 ml  Output 1450 ml  Net 1014.59 ml   Filed Weights   07/02/23 0500 07/03/23 0500 07/04/23 0124  Weight: 87.2 kg 87.5 kg 87.1 kg    Examination: General: thin adult male in NAD HENT: [place]/AT, PERRL. Dried blood R nare.  Lungs: Bilateral ventilated breath sounds, no accessory muscle use, audible leak from tracheostomy site Cardiovascular: RRR, 4/6 murmur Abdomen: Soft, NT, ND Extremities: No acute deformity or edema.  Neuro: Unresponsive, RASS -2 GU: foley with amber urine   CT> diffuse GG throughout both lungs, free air adjacent to hepatic flexure and descending colon. Labs 11/3 normal electrolytes, BUN rising to 81, creatinine 1.5, no leukocytosis, hemoglobin dropped to 7.1 from 8.5, mild thrombocytopenia  Chest x-ray 11/3 shows improved infiltrate upper lobes, bibasilar infiltrates persist  Resolved Hospital Problem list     Assessment & Plan:  Acute respiratory failure with hypoxia & hypercapnia ARDS- unclear if pneumonia vs  edema from mitral regurgitation, improvement in infiltrates with diuresis suggests that most of this is fluid Recent tracheostomy x2 , [facility] - low tidal volume ventilation -VAP prevention protocol -Will need redo  tracheostomy, wife is agreeable plan for tomorrow -Continue Zosyn,   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  -Per cardiology note from 10/11, family did not want [facility] involved in his care - his overall status would need to improve to be considered for valve surgery     Anemia due to critical illness , drop from 8.5-7.1 on 11/3 but no evidence of blood loss -transfuse for Hb <7 or hemodynamically significant bleeding -monitor  RUE DVT provoked by PICC -holding AC due to hemoptysis, hemoglobin drop -needs 3 months for provoked clot  Afib, paroxymsal -amiodarone continue -holding eliquis for now  Thrombocytopenia -Monitor  Hyperglycemia -SSI PRN -goal BG 140-180  ICU delirium EEG negative for seizure -Restart Seroquel -PAD protocol -Propofol and fentanyl for RASS goal 0 to -1.   Deconditioning, debility Severe protein energy malnutrition No evidence of bowel perforation - enteral TF restarted and tolerating  BPH -unable to take tamsulosin until able to take pills -Foley  Perineal moisture associated skin breakdown -Foley, flexiseal   Summary - We have contacted [facility] who has refused transfer.  He is not a candidate for valve surgery given critical illness.  Best course is to redo trach and continue rehab efforts in the hopes that he will liberate from the vent and be a candidate.   Best Practice (right click and "Reselect all SmartList Selections" daily)   Diet/type: NPO DVT prophylaxis: SCD GI prophylaxis: PPI Lines: N/A Foley:  Yes, and it is still needed Code Status:  full code Last date of multidisciplinary goals of care discussion [in ED]  Labs   CBC: Recent Labs  Lab 06/29/23 0526 07/01/23 0500 07/01/23 1812 07/01/23 1817 07/01/23 2203  07/02/23 0015 07/04/23 0534  WBC 10.2 17.3*  --  21.8*  --  25.0* 9.6  NEUTROABS 6.4 13.8*  --  19.6*  --   --  6.2  HGB 11.2* 12.0* 9.9* 9.9* 10.2* 8.5* 7.1*  HCT 35.5* 37.2* 29.0* 31.1* 30.0* 27.2* 23.0*  MCV 101.1* 101.9*  --  101.3*  --  103.0* 104.5*  PLT 166 143*  --  132*  --  185 121*    Basic Metabolic Panel: Recent Labs  Lab 06/29/23 0525 06/29/23 0526 07/01/23 1330 07/01/23 1812 07/01/23 1817 07/01/23 2203 07/02/23 0015 07/02/23 1735 07/03/23 0332 07/04/23 0534  NA  --    < > 147*   < > 145 147* 144  --  142 144  K  --    < > 4.5   < > 4.5 4.4 4.8  --  4.0 3.6  CL  --    < > 108  --  111  --  111  --  106 106  CO2  --    < > 27  --  22  --  26  --  28 28  GLUCOSE  --    < > 183*  --  188*  --  160*  --  112* 135*  BUN  --    < > 39*  --  41*  --  48*  --  58* 81*  CREATININE  --    < >  1.06  --  1.00  --  1.25*  --  1.50* 1.54*  CALCIUM  --    < > 9.3  --  8.8*  --  9.3  --  8.4* 8.4*  MG 2.4  --   --   --   --   --  2.6* 2.4 2.7* 2.8*  PHOS 3.4  --   --   --   --   --  5.0* 4.6 5.1* 4.3   < > = values in this interval not displayed.   GFR: Estimated Creatinine Clearance: 51.2 mL/min (A) (by C-G formula based on SCr of 1.54 mg/dL (H)). Recent Labs  Lab 07/01/23 0500 07/01/23 1817 07/01/23 1825 07/02/23 0015 07/04/23 0534  PROCALCITON  --   --   --  2.91  --   WBC 17.3* 21.8*  --  25.0* 9.6  LATICACIDVEN  --   --  1.9  --   --     Liver Function Tests: Recent Labs  Lab 06/28/23 0353 06/29/23 0526 06/30/23 0458 07/01/23 1330 07/01/23 1817  AST 65* 108* 55* 48* 49*  ALT 44 74* 48* 38 37  ALKPHOS 68 90 69 72 74  BILITOT 0.9 1.1 0.9 1.8* 1.7*  PROT 6.4* 6.6 5.7* 6.1* 6.2*  ALBUMIN 2.6* 2.7* 2.4* 2.5* 2.4*   Recent Labs  Lab 07/01/23 1817  LIPASE 26   No results for input(s): "AMMONIA" in the last 168 hours.  ABG    Component Value Date/Time   PHART 7.36 07/02/2023 0145   PCO2ART 46 07/02/2023 0145   PO2ART 120 (H) 07/02/2023 0145    HCO3 26.6 07/02/2023 0145   TCO2 27 07/01/2023 2203   ACIDBASEDEF 2.0 07/01/2023 2203   O2SAT 99.5 07/02/2023 0145     Coagulation Profile: Recent Labs  Lab 07/01/23 1330  INR 1.9*    Cardiac Enzymes: No results for input(s): "CKTOTAL", "CKMB", "CKMBINDEX", "TROPONINI" in the last 168 hours.  HbA1C: Hgb A1c MFr Bld  Date/Time Value Ref Range Status  06/30/2023 04:58 AM 4.1 (L) 4.8 - 5.6 % Final    Comment:    (NOTE) Pre diabetes:          5.7%-6.4%  Diabetes:              >6.4%  Glycemic control for   <7.0% adults with diabetes   12/09/2015 06:30 PM 5.1 4.8 - 5.6 % Final    Comment:    (NOTE)         Pre-diabetes: 5.7 - 6.4         Diabetes: >6.4         Glycemic control for adults with diabetes: <7.0     CBG: Recent Labs  Lab 07/03/23 1523 07/03/23 1912 07/03/23 2305 07/04/23 0305 07/04/23 0723  GLUCAP 122* 126* 114* 135* 116*   Critical care time: 36 min.     Cyril Mourning MD. Tonny Bollman. [facility] Pager : [phone]  If no response to pager , please call [phone] until 7 pm After 7:00 pm call Elink  [phone]     07/04/2023 8:02 AM

## 2023-07-05 ENCOUNTER — Inpatient Hospital Stay (HOSPITAL_COMMUNITY): Payer: Medicare Other

## 2023-07-05 DIAGNOSIS — J9601 Acute respiratory failure with hypoxia: Secondary | ICD-10-CM | POA: Diagnosis not present

## 2023-07-05 DIAGNOSIS — J9602 Acute respiratory failure with hypercapnia: Secondary | ICD-10-CM | POA: Diagnosis not present

## 2023-07-05 DIAGNOSIS — J810 Acute pulmonary edema: Secondary | ICD-10-CM | POA: Diagnosis not present

## 2023-07-05 LAB — CBC WITH DIFFERENTIAL/PLATELET
Abs Immature Granulocytes: 0.36 10*3/uL — ABNORMAL HIGH (ref 0.00–0.07)
Basophils Absolute: 0.1 10*3/uL (ref 0.0–0.1)
Basophils Relative: 1 %
Eosinophils Absolute: 0.5 10*3/uL (ref 0.0–0.5)
Eosinophils Relative: 6 %
HCT: 24.4 % — ABNORMAL LOW (ref 39.0–52.0)
Hemoglobin: 7.6 g/dL — ABNORMAL LOW (ref 13.0–17.0)
Immature Granulocytes: 4 %
Lymphocytes Relative: 22 %
Lymphs Abs: 2.1 10*3/uL (ref 0.7–4.0)
MCH: 32.9 pg (ref 26.0–34.0)
MCHC: 31.1 g/dL (ref 30.0–36.0)
MCV: 105.6 fL — ABNORMAL HIGH (ref 80.0–100.0)
Monocytes Absolute: 0.7 10*3/uL (ref 0.1–1.0)
Monocytes Relative: 7 %
Neutro Abs: 5.9 10*3/uL (ref 1.7–7.7)
Neutrophils Relative %: 60 %
Platelets: 137 10*3/uL — ABNORMAL LOW (ref 150–400)
RBC: 2.31 MIL/uL — ABNORMAL LOW (ref 4.22–5.81)
RDW: 17.9 % — ABNORMAL HIGH (ref 11.5–15.5)
WBC: 9.6 10*3/uL (ref 4.0–10.5)
nRBC: 0.9 % — ABNORMAL HIGH (ref 0.0–0.2)

## 2023-07-05 LAB — BASIC METABOLIC PANEL
Anion gap: 10 (ref 5–15)
BUN: 73 mg/dL — ABNORMAL HIGH (ref 8–23)
CO2: 27 mmol/L (ref 22–32)
Calcium: 8.9 mg/dL (ref 8.9–10.3)
Chloride: 109 mmol/L (ref 98–111)
Creatinine, Ser: 1.19 mg/dL (ref 0.61–1.24)
GFR, Estimated: >60 mL/min (ref >60)
Glucose, Bld: 125 mg/dL — ABNORMAL HIGH (ref 70–99)
Potassium: 4.3 mmol/L (ref 3.5–5.1)
Sodium: 146 mmol/L — ABNORMAL HIGH (ref 135–145)

## 2023-07-05 LAB — GLUCOSE, CAPILLARY
Glucose-Capillary: 114 mg/dL — ABNORMAL HIGH (ref 70–99)
Glucose-Capillary: 151 mg/dL — ABNORMAL HIGH (ref 70–99)
Glucose-Capillary: 124 mg/dL — ABNORMAL HIGH (ref 70–99)
Glucose-Capillary: 147 mg/dL — ABNORMAL HIGH (ref 70–99)
Glucose-Capillary: 127 mg/dL — ABNORMAL HIGH (ref 70–99)
Glucose-Capillary: 141 mg/dL — ABNORMAL HIGH (ref 70–99)

## 2023-07-05 LAB — TRIGLYCERIDES: Triglycerides: 72 mg/dL (ref <150)

## 2023-07-05 LAB — MAGNESIUM: Magnesium: 3.1 mg/dL — ABNORMAL HIGH (ref 1.7–2.4)

## 2023-07-05 LAB — PHOSPHORUS: Phosphorus: 3.2 mg/dL (ref 2.5–4.6)

## 2023-07-05 MED ORDER — PHENYLEPHRINE 80 MCG/ML (10ML) SYRINGE FOR IV PUSH (FOR BLOOD PRESSURE SUPPORT)
PREFILLED_SYRINGE | INTRAVENOUS | Status: AC
  Filled 2023-07-05: qty 10

## 2023-07-05 MED ORDER — ROCURONIUM BROMIDE 10 MG/ML (PF) SYRINGE
PREFILLED_SYRINGE | INTRAVENOUS | Status: AC
  Administered 2023-07-05: 80 mg via INTRAVENOUS
  Filled 2023-07-05: qty 10

## 2023-07-05 MED ORDER — LIDOCAINE-EPINEPHRINE (PF) 2 %-1:200000 IJ SOLN: 10.0000 mL | Freq: Once | INTRAMUSCULAR | Status: AC

## 2023-07-05 MED ORDER — ROCURONIUM BROMIDE 10 MG/ML (PF) SYRINGE
80.0000 mg | PREFILLED_SYRINGE | Freq: Once | INTRAVENOUS | Status: AC
Start: 2023-07-05 — End: 2023-07-05

## 2023-07-05 MED ORDER — FENTANYL BOLUS VIA INFUSION
100.0000 ug | Freq: Once | INTRAVENOUS | Status: AC
  Administered 2023-07-05: 100 ug via INTRAVENOUS
  Filled 2023-07-05: qty 100

## 2023-07-05 MED ORDER — ETOMIDATE 2 MG/ML IV SOLN
INTRAVENOUS | Status: AC
  Administered 2023-07-05: 20 mg via INTRAVENOUS
  Filled 2023-07-05: qty 10

## 2023-07-05 MED ORDER — FREE WATER
200.0000 mL | Status: DC
  Administered 2023-07-05 – 2023-07-12 (×41): 200 mL

## 2023-07-05 MED ORDER — PIPERACILLIN-TAZOBACTAM 3.375 G IVPB
3.3750 g | Freq: Three times a day (TID) | INTRAVENOUS | Status: AC
  Administered 2023-07-05 – 2023-07-07 (×8): 3.375 g via INTRAVENOUS
  Filled 2023-07-05 (×10): qty 50

## 2023-07-05 MED ORDER — ETOMIDATE 2 MG/ML IV SOLN: 20.0000 mg | Freq: Once | INTRAVENOUS | Status: AC

## 2023-07-05 MED ORDER — FENTANYL CITRATE PF 50 MCG/ML IJ SOSY: 100.0000 ug | PREFILLED_SYRINGE | Freq: Once | INTRAMUSCULAR | Status: DC

## 2023-07-05 MED ORDER — LIDOCAINE-EPINEPHRINE (PF) 2 %-1:200000 IJ SOLN
INTRAMUSCULAR | Status: AC
  Administered 2023-07-05: 10 mL via EPIDURAL
  Filled 2023-07-05: qty 20

## 2023-07-05 MED ORDER — FENTANYL BOLUS VIA INFUSION
50.0000 ug | Freq: Once | INTRAVENOUS | Status: AC
  Administered 2023-07-05: 50 ug via INTRAVENOUS
  Filled 2023-07-05: qty 50

## 2023-07-05 MED ORDER — FUROSEMIDE 10 MG/ML IJ SOLN
40.0000 mg | Freq: Once | INTRAMUSCULAR | Status: AC
  Administered 2023-07-05: 40 mg via INTRAVENOUS
  Filled 2023-07-05: qty 4

## 2023-07-05 MED ORDER — QUETIAPINE FUMARATE 50 MG PO TABS
50.0000 mg | ORAL_TABLET | Freq: Two times a day (BID) | ORAL | Status: DC
  Administered 2023-07-05: 50 mg
  Filled 2023-07-05: qty 1

## 2023-07-05 NOTE — Plan of Care (Signed)
  Problem: Fluid Volume: Goal: Ability to maintain a balanced intake and output will improve Outcome: Progressing   Problem: Metabolic: Goal: Ability to maintain appropriate glucose levels will improve Outcome: Progressing   Problem: Coping: Goal: Level of anxiety will decrease Outcome: Progressing

## 2023-07-05 NOTE — Progress Notes (Signed)
SLP Cancellation Note  Patient Details Name: Joshua Eaton MRN: [id number] DOB: 1955/09/18   Cancelled treatment:       Reason Eval/Treat Not Completed: Patient not medically ready. Patient with revision of tracheostomy today. Orders for SLP eval and treat for PMSV and swallowing received. Per RN, best to hold for today. Will follow pt closely for readiness for SLP interventions as appropriate.     Mahala Menghini., M.A. CCC-SLP Acute Rehabilitation Services Office [phone]  Secure chat preferred  07/05/2023, 1:16 PM

## 2023-07-05 NOTE — Procedures (Signed)
Bronchoscopy Procedure Note  Edris Friedt  [id number]  05-03-1956  Date:07/05/23  Time:10:52 AM   Provider Performing:Contrell Ballentine V. Abdallah Hern   Procedure(s):  Flexible Bronchoscopy [phone])  Indication(s) For percutaneous tracheostomy  Consent Risks of the procedure as well as the alternatives and risks of each were explained to the patient and/or caregiver.  Consent for the procedure was obtained and is signed in the bedside chart  Anesthesia Propofol, etomidate, fentanyl   Time Out Verified patient identification, verified procedure, site/side was marked, verified correct patient position, special equipment/implants available, medications/allergies/relevant history reviewed, required imaging and test results available.   Sterile Technique Usual hand hygiene, masks, gowns, and gloves were used   Procedure Description Bronchoscope advanced through endotracheal tube and into airway.  Airways were examined down to subsegmental level with findings noted below.   Following diagnostic evaluation, ET tube was withdrawn up to 18 cm.  Visualization provided for percutaneous tracheostomy procedure.  Findings: Minimal bleeding, suctioned out.  Position of tracheostomy tube confirmed   Complications/Tolerance None; patient tolerated the procedure well. Chest X-ray is needed post procedure.   EBL Minimal   Specimen(s) None  Tlaloc Taddei V. Vassie Loll MD

## 2023-07-05 NOTE — Procedures (Signed)
Percutaneous Tracheostomy Procedure Note   Joshua Eaton  [id number]  28-Dec-1955  Date:07/05/23  Time:10:39 AM   Provider Performing:Guled Gahan C Katrinka Blazing  Procedure: Percutaneous Tracheostomy with Bronchoscopic Guidance ([id number]) Redo  Indication(s) Persistent resp failure  Consent Risks of the procedure as well as the alternatives and risks of each were explained to the patient and/or caregiver.  Consent for the procedure was obtained.  Anesthesia Etomidate, Versed, Fentanyl, Rocuronium   Time Out Verified patient identification, verified procedure, site/side was marked, verified correct patient position, special equipment/implants available, medications/allergies/relevant history reviewed, required imaging and test results available.   Sterile Technique Maximal sterile technique including sterile barrier drape, hand hygiene, sterile gown, sterile gloves, mask, hair covering.    Procedure Description Appropriate anatomy identified by palpation.  Patient's neck prepped and draped in sterile fashion.  1% lidocaine with epinephrine was used to anesthetize skin overlying neck.  1.5cm incision made and blunt dissection performed until tracheal rings could be easily palpated.   Then a size 8-0 prox XLT Shiley tracheostomy was placed under bronchoscopic visualization using usual Seldinger technique and serial dilation.   Bronchoscope confirmed placement above the carina.  Tracheostomy was sutured in place with adhesive pad to protect skin under pressure.    Patient connected to ventilator.   Complications/Tolerance Janina Mayo is low (which I suspect it was before as we are using same stoma), using gauze to lift some; if recurrent issues would try regular 8-0 vs. Adjustable length bivona Fair deal of tracheal ring damage noted, if another redo is needed may be best done in OR  Chest X-ray is ordered to confirm no post-procedural complication.   EBL Minimal   Specimen(s) None

## 2023-07-05 NOTE — TOC Initial Note (Signed)
Transition of Care [facility]) - Initial/Assessment Note    Patient Details  Name: Joshua Eaton MRN: [id number] Date of Birth: 1956-05-05  Transition of Care [facility]) CM/SW Contact:    Harriet Masson, RN Phone Number: 07/05/2023, 2:36 PM  Clinical Narrative:                  Patient readmitted to hospital from [facility] for respiratory failure.  Currently has a trach and is on propofol drip. TOC will continue to follow for transition needs.   Expected Discharge Plan: Long Term Acute Care (LTAC) Barriers to Discharge: Continued Medical Work up   Patient Goals and CMS Choice            Expected Discharge Plan and Services                                              Prior Living Arrangements/Services                       Activities of Daily Living      Permission Sought/Granted                  Emotional Assessment              Admission diagnosis:  Respiratory failure (HCC) [J96.90] Acute pulmonary edema (HCC) [J81.0] Acute respiratory failure with hypoxia (HCC) [J96.01] Free intraperitoneal air [K66.8] Patient Active Problem List   Diagnosis Date Noted   Free intraperitoneal air 07/02/2023   Other pulmonary embolism without acute cor pulmonale (HCC) 07/02/2023   Pressure injury of skin 07/02/2023   Malnutrition of moderate degree 07/02/2023   Respiratory failure (HCC) 07/01/2023   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   Moderate aortic insufficiency 06/10/2023   Acute respiratory failure with hypoxia (HCC) 06/10/2023   On mechanically assisted ventilation (HCC) 06/04/2023   Acute pulmonary edema (HCC) 06/04/2023   Acute hypoxemic respiratory failure (HCC) 06/03/2023   Severe mitral regurgitation 06/03/2023   AKI (acute kidney injury) (HCC) 06/03/2023   Insomnia 08/12/2018   Anxiety 08/12/2018   Severe recurrent major depression without psychotic features (HCC) 02/04/2016   Obsessive compulsive disorder 02/04/2016   PCP:   Kaleen Mask, MD Pharmacy:   [facility] - [place], [place] - [address] [place] [address] Phone: [phone] Fax: [phone]     Social Determinants of Health (SDOH) Social History: SDOH Screenings   Food Insecurity: Patient Unable To Answer (06/29/2023)   Received from [facility]  Transportation Needs: Patient Unable To Answer (06/29/2023)   Received from [facility]  Utilities: Patient Unable To Answer (06/29/2023)   Received from [facility]  Financial Resource Strain: Patient Unable To Answer (06/29/2023)   Received from [facility]  Social Connections: Patient Unable To Answer (06/29/2023)   Received from [facility]  Stress: Patient Unable To Answer (06/29/2023)   Received from [facility]  Tobacco Use: Low Risk  (07/01/2023)   SDOH Interventions:     Readmission Risk Interventions     No data to display

## 2023-07-05 NOTE — Procedures (Deleted)
Tracheostomy Change Note  Patient Details:   Name: Joshua Eaton DOB: [birth year] MRN: [id number]    Airway Documentation: RT at bedside to assist with bedside tracheostomy. RT removed 7.0 ETT during procedure while CCM placed #8 Proximal XLT.     Evaluation  O2 sats: stable throughout Complications: No apparent complications Patient did tolerate procedure well. Bilateral Breath Sounds: Diminished    [name] 07/05/2023, 10:44 AM

## 2023-07-05 NOTE — Procedures (Signed)
Bedside Tracheostomy Insertion Procedure Note   Patient Details:   Name: Joshua Eaton DOB: [birth year] MRN: [id number]  Procedure: Tracheostomy  Pre Procedure Assessment: ET Tube Size: 7.0 ET Tube secured at lip (cm): 26 Bite block in place: No Breath Sounds: Clear and Diminished  Post Procedure Assessment: BP (!) 114/49   Pulse 79   Temp 99.5 F (37.5 C) (Axillary)   Resp 20   Ht 6' 0.99" (1.854 m)   Wt 92.3 kg   SpO2 100%   BMI 26.85 kg/m  O2 sats: stable throughout Complications: No apparent complications Patient did tolerate procedure well Tracheostomy Brand:Shiley Tracheostomy Style:Cuffed Tracheostomy Size:  8 XLT Proximal Tracheostomy Secured NWG:[id number] tie Tracheostomy Placement Confirmation:Trach cuff visualized and in place   RT x2 at bedside to assist with bedside tracheostomy. RT removed 7.0 ETT during procedure while CCM placed #8 Proximal XLT.    [name] 07/05/2023, 10:46 AM

## 2023-07-05 NOTE — Progress Notes (Addendum)
NAME:  Joshua Eaton, MRN:  [id number], DOB:  April 09, 1956, LOS: 4 ADMISSION DATE:  07/01/2023, CONSULTATION DATE:  07/01/2023 REFERRING MD:  Particia Nearing, EDP CHIEF COMPLAINT:  Acute on chronic hypoxic respiratory failure   History of Present Illness:  Mr. Joshua Eaton is a 67 y/o gentleman with a history of respiratory failure and tracheostomy with recent admission from 10/3-10/28 at [facility] after a 2 week admission at [facility] in September for pneumonia complicated by ARDS and acute cardiogenic pulmonary edema. He has severe MR recently diagnosed and is not a candidate for valve replacement due to overall frailty. He underwent tracheostomy and PEG placement during his admission and was transferred to [facility] on 10/28. Later that day he had an accidental tracheostomy dislodgement, and his trach stayed out. Yesterday he developed increased work of breathing and was transferred to the ED for further evaluation. He was trialed on BiPAP but did not tolerate it well and eventually required reintubation in the ED. He underwent CT scan demonstrating possible contained bowel perforation; Ct with enteral contrast has now been ordered and surgery has evaluated him. PCCM was consulted for admission.   Pertinent  Medical History  HTN Respiratory failure requiring trach Afib DVT- provoked by PICC  Significant Hospital Events: Including procedures, antibiotic start and stop dates in addition to other pertinent events   10/3 initially admitted 10/6 trach 10/9 TEE with severe MR with mitral valve perforation 10/11 cardiothoracic surgery, not a surgical candidate  10/18 trach revision after cuff leak> required reintubation to stabilize 10/24 PEG placement 10/28 discharged to [facility], dislodged trach, which was then left out. 10/31 readmitted for respiratory failure,possible contained bowel perforation 10/31 CT chest/abdomen/pelvis shows diffuse airspace disease 11/1 CT abd/pelvis w/ enteral contrast negative for gastric or  enteric leak - the contrast is visible in his stomach, small bowel, and colon to the level of his rectum without extravasation.  11/4 tracheostomy revision  Interim History / Subjective:  Sedated and mechanically ventilated  Objective   Blood pressure (!) 104/51, pulse 78, temperature 99.7 F (37.6 C), temperature source Oral, resp. rate 20, height 6' 0.99" (1.854 m), weight 92.3 kg, SpO2 100%.    Vent Mode: PRVC FiO2 (%):  [40 %] 40 % Set Rate:  [20 bmp-24 bmp] 20 bmp Vt Set:  [500 mL] 500 mL PEEP:  [5 cmH20] 5 cmH20 Plateau Pressure:  [19 cmH20-26 cmH20] 20 cmH20   Intake/Output Summary (Last 24 hours) at 07/05/2023 0619 Last data filed at 07/05/2023 0600 Gross per 24 hour  Intake 1787.88 ml  Output 2625 ml  Net -837.12 ml   Filed Weights   07/03/23 0500 07/04/23 0124 07/05/23 0401  Weight: 87.5 kg 87.1 kg 92.3 kg    Examination: General: thin adult male in NAD HENT: [place]/AT, PERRL.  Lungs: Bilateral ventilated breath sounds, no accessory muscle use Cardiovascular: RRR, 4/6 murmur Abdomen: Soft, NT, ND Extremities: No acute deformity or edema.  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  -Per cardiology note from 10/11, family did not want [facility] involved in his care - his overall status would need to improve to be  considered for valve surgery   Anemia due to critical illness , drop from 8.5-7.1 on 11/3 but no evidence of blood loss -transfuse for Hb <7 or hemodynamically significant bleeding -monitor   RUE DVT provoked by PICC -holding AC due to hemoptysis, hemoglobin drop -needs 3 months for provoked clot   Afib, paroxymsal -amiodarone continue -holding eliquis for now   Thrombocytopenia -Monitor   Hyperglycemia -SSI PRN -goal BG 140-180   ICU delirium EEG negative for seizure -continue Seroquel -PAD protocol -Propofol and fentanyl for RASS goal 0 to -1.    Deconditioning, debility Severe protein energy malnutrition No evidence of bowel perforation - enteral TF restarted and tolerating   BPH -unable to take tamsulosin until able to take pills -Foley   Perineal moisture associated skin breakdown -Foley, flexiseal     Summary - We have contacted [facility] who has refused transfer.  He is not a candidate for valve surgery given critical illness.  Best course is to redo trach and continue rehab efforts in the hopes that he will liberate from the vent and be a candidate.  Best Practice (right click and "Reselect all SmartList Selections" daily)   Diet/type: tubefeeds DVT prophylaxis: SCD GI prophylaxis: PPI Lines: N/A Foley:  Yes, and it is still needed Code Status:  full code Last date of multidisciplinary goals of care discussion [07/05/2023]  Labs   CBC: Recent Labs  Lab 06/29/23 0526 07/01/23 0500 07/01/23 1812 07/01/23 1817 07/01/23 2203 07/02/23 0015 07/04/23 0534 07/04/23 1144  WBC 10.2 17.3*  --  21.8*  --  25.0* 9.6 10.1  NEUTROABS 6.4 13.8*  --  19.6*  --   --  6.2  --   HGB 11.2* 12.0*   < > 9.9* 10.2* 8.5* 7.1* 7.1*  HCT 35.5* 37.2*   < > 31.1* 30.0* 27.2* 23.0* 22.5*  MCV 101.1* 101.9*  --  101.3*  --  103.0* 104.5* 105.6*  PLT 166 143*  --  132*  --  185 121* 126*   < > = values in this interval not displayed.    Basic Metabolic Panel: Recent Labs   Lab 06/29/23 0525 06/29/23 0526 07/01/23 1330 07/01/23 1812 07/01/23 1817 07/01/23 2203 07/02/23 0015 07/02/23 1735 07/03/23 0332 07/04/23 0534  NA  --    < > 147*   < > 145 147* 144  --  142 144  K  --    < > 4.5   < > 4.5 4.4 4.8  --  4.0 3.6  CL  --    < > 108  --  111  --  111  --  106 106  CO2  --    < > 27  --  22  --  26  --  28 28  GLUCOSE  --    < > 183*  --  188*  --  160*  --  112* 135*  BUN  --    < > 39*  --  41*  --  48*  --  58* 81*  CREATININE  --    < > 1.06  --  1.00  --  1.25*  --  1.50* 1.54*  CALCIUM  --    < > 9.3  --  8.8*  --  9.3  --  8.4* 8.4*  MG 2.4  --   --   --   --   --  2.6* 2.4 2.7* 2.8*  PHOS 3.4  --   --   --   --   --  5.0* 4.6 5.1* 4.3   < > = values in this interval not displayed.   GFR: Estimated Creatinine Clearance: 51.2 mL/min (A) (by C-G formula based on SCr of 1.54 mg/dL (H)). Recent Labs  Lab 07/01/23 1817 07/01/23 1825 07/02/23 0015 07/04/23 0534 07/04/23 1144  PROCALCITON  --   --  2.91  --   --   WBC 21.8*  --  25.0* 9.6 10.1  LATICACIDVEN  --  1.9  --   --   --     Liver Function Tests: Recent Labs  Lab 06/29/23 0526 06/30/23 0458 07/01/23 1330 07/01/23 1817  AST 108* 55* 48* 49*  ALT 74* 48* 38 37  ALKPHOS 90 69 72 74  BILITOT 1.1 0.9 1.8* 1.7*  PROT 6.6 5.7* 6.1* 6.2*  ALBUMIN 2.7* 2.4* 2.5* 2.4*   Recent Labs  Lab 07/01/23 1817  LIPASE 26   No results for input(s): "AMMONIA" in the last 168 hours.  ABG    Component Value Date/Time   PHART 7.36 07/02/2023 0145   PCO2ART 46 07/02/2023 0145   PO2ART 120 (H) 07/02/2023 0145   HCO3 26.6 07/02/2023 0145   TCO2 27 07/01/2023 2203   ACIDBASEDEF 2.0 07/01/2023 2203   O2SAT 99.5 07/02/2023 0145     Coagulation Profile: Recent Labs  Lab 07/01/23 1330  INR 1.9*    Cardiac Enzymes: No results for input(s): "CKTOTAL", "CKMB", "CKMBINDEX", "TROPONINI" in the last 168 hours.  HbA1C: Hgb A1c MFr Bld  Date/Time Value Ref Range Status  06/30/2023  04:58 AM 4.1 (L) 4.8 - 5.6 % Final    Comment:    (NOTE) Pre diabetes:          5.7%-6.4%  Diabetes:              >6.4%  Glycemic control for   <7.0% adults with diabetes   12/09/2015 06:30 PM 5.1 4.8 - 5.6 % Final    Comment:    (NOTE)         Pre-diabetes: 5.7 - 6.4         Diabetes: >6.4         Glycemic control for adults with diabetes: <7.0     CBG: Recent Labs  Lab 07/04/23 1135 07/04/23 1524 07/04/23 1941 07/04/23 2323 07/05/23 0342  GLUCAP 139* 133* 129* 138* 114*    Review of Systems:   Unable to obtain  Past Medical History:  He,  has a past medical history of Anxiety, Cancer (HCC), Depression, Hypertension, and OCD (obsessive compulsive disorder).   Surgical History:   Past Surgical History:  Procedure Laterality Date   CHOLECYSTECTOMY     COLON SURGERY     cancerous polyp removed   HERNIA REPAIR     IR GASTROSTOMY TUBE MOD SED  06/24/2023     Social History:   reports that he has never smoked. He has never used smokeless tobacco. He reports that he does not drink alcohol and does not use drugs.   Family History:  His family history is not on file.   Allergies Allergies  Allergen Reactions   Codeine     "sends me into orbit"     Home Medications  Prior to Admission medications   Medication Sig Start Date End Date Taking?  Authorizing Provider  acetaminophen (TYLENOL) 325 MG tablet Take 650 mg by mouth every 6 (six) hours as needed for moderate pain.   Yes [provider]  ACIDOPHILUS LACTOBACILLUS PO Place 1 capsule into feeding tube daily.   Yes [provider]  amiodarone (PACERONE) 200 MG tablet Place 1 tablet (200 mg total) into feeding tube 2 (two) times daily for 4 days, THEN 1 tablet (200 mg total) daily. 06/28/23 08/01/23 Yes Rhetta Mura, MD  apixaban (ELIQUIS) 5 MG TABS tablet Place 1 tablet (5 mg total) into feeding tube 2 (two) times daily. 06/28/23  Yes Rhetta Mura, MD  aspirin 81 MG chewable  tablet Place 81 mg into feeding tube daily.   Yes [provider]  ceFEPIme 2 g in sodium chloride 0.9 % 100 mL Inject 2 g into the vein every 8 (eight) hours.   Yes [provider]  dextrose (GLUTOSE) 40 % GEL Take 15 g by mouth daily as needed for low blood sugar (70mg /dL).   Yes [provider]  Electrolyte-A in Dextrose (DEXTROSE 50%/ELECTROLYTES IV) Inject 25 g into the vein daily as needed (blood sugar 50mg /dL).   Yes [provider]  feeding supplement (OSMOLITE 1 CAL) LIQD Take 1,000 mLs by mouth daily. 65 mL/hr 06/28/23  Yes Rhetta Mura, MD  furosemide (LASIX) 10 MG/ML injection Inject 10 mg into the muscle once.   Yes [provider]  Glucagon HCl 1 MG SOLR Inject 1 mg as directed once as needed (hypoglycemia).   Yes [provider]  hydrALAZINE (APRESOLINE) 10 MG tablet Place 10 mg into feeding tube every 8 (eight) hours.   Yes [provider]  HYDROcodone-acetaminophen (NORCO/VICODIN) 5-325 MG tablet Take 1 tablet by mouth every 6 (six) hours as needed for moderate pain (pain score 4-6).   Yes [provider]  insulin lispro (HUMALOG) 100 UNIT/ML injection Inject 0-6 Units into the skin 3 (three) times daily before meals. Per Sliding Scale: Blood sugar very low dose <70 initiate hypoglycemic treatment 70-200: No insulin 201-250: 2 units 251-300: 3 units 301-350: 4 units 351-400: 5 units >400: 6 units call medical provider   Yes [provider]  ipratropium-albuterol (DUONEB) 0.5-2.5 (3) MG/3ML SOLN Take 3 mLs by nebulization every 4 (four) hours as needed (sob/wheezing).   Yes [provider]  loperamide (IMODIUM) 2 MG capsule Take 2 mg by mouth in the morning, at noon, and at bedtime.   Yes [provider]  LORazepam (ATIVAN) 0.5 MG tablet Take 1 tablet (0.5 mg total) by mouth in the morning. Patient taking differently: Take 0.5 mg by mouth every 8 (eight) hours as needed for  anxiety. 06/28/23  Yes Rhetta Mura, MD  magnesium oxide (MAG-OX) 400 (240 Mg) MG tablet Take 400 mg by mouth every 6 (six) hours as needed (magnesium electrolyte replacement).   Yes [provider]  methylPREDNISolone sodium succinate (SOLU-MEDROL) 40 mg/mL injection Inject 80 mg into the vein every 8 (eight) hours.   Yes [provider]  metoprolol tartrate (LOPRESSOR) 25 MG tablet Place 0.5 tablets (12.5 mg total) into feeding tube 2 (two) times daily. 06/28/23  Yes Rhetta Mura, MD  metroNIDAZOLE (FLAGYL) 500 MG/100ML Inject 500 mg into the vein every 8 (eight) hours.   Yes [provider]  Morphine Sulfate, PF, 2 MG/ML SOLN Inject 2 mg as directed once as needed (pain.).   Yes [provider]  pantoprazole (PROTONIX) 2 mg/mL suspension Place 40 mg into feeding tube daily.  Yes [provider]  potassium chloride SA (KLOR-CON M) 20 MEQ tablet Take 40 mEq by mouth 2 (two) times daily. Or every 4 hours as needed for potassium electrolyte replacement.   Yes [provider]  QUEtiapine (SEROQUEL) 25 MG tablet Place 1 tablet (25 mg total) into feeding tube 2 (two) times daily. 06/28/23  Yes Rhetta Mura, MD  simethicone (MYLICON) 80 MG chewable tablet Place 80 mg into feeding tube 4 (four) times daily.   Yes [provider]  metroNIDAZOLE (FLAGYL) 500 MG tablet Place 500 mg into feeding tube every 8 (eight) hours. Patient not taking: Reported on 07/02/2023    [provider]  Water For Irrigation, Sterile (FREE WATER) SOLN Place 200 mLs into feeding tube every 6 (six) hours. 06/28/23   Rhetta Mura, MD     Critical care time: 46    Rocky Morel, DO Internal Medicine Resident, PGY-2 Pager# [phone] [facility] 07/05/2023 6:19 AM  For contact information, see Amion. If no response to pager, please call PCCM consult pager. After hours, 7PM- 7AM, please call Elink.

## 2023-07-06 DIAGNOSIS — G9340 Encephalopathy, unspecified: Secondary | ICD-10-CM | POA: Diagnosis not present

## 2023-07-06 DIAGNOSIS — J9601 Acute respiratory failure with hypoxia: Secondary | ICD-10-CM | POA: Diagnosis not present

## 2023-07-06 DIAGNOSIS — Z930 Tracheostomy status: Secondary | ICD-10-CM

## 2023-07-06 DIAGNOSIS — J9602 Acute respiratory failure with hypercapnia: Secondary | ICD-10-CM | POA: Diagnosis not present

## 2023-07-06 LAB — CBC
HCT: 23.8 % — ABNORMAL LOW (ref 39.0–52.0)
Hemoglobin: 7.3 g/dL — ABNORMAL LOW (ref 13.0–17.0)
MCH: 32.6 pg (ref 26.0–34.0)
MCHC: 30.7 g/dL (ref 30.0–36.0)
MCV: 106.3 fL — ABNORMAL HIGH (ref 80.0–100.0)
Platelets: 117 10*3/uL — ABNORMAL LOW (ref 150–400)
RBC: 2.24 MIL/uL — ABNORMAL LOW (ref 4.22–5.81)
RDW: 17.9 % — ABNORMAL HIGH (ref 11.5–15.5)
WBC: 7.1 10*3/uL (ref 4.0–10.5)
nRBC: 0.4 % — ABNORMAL HIGH (ref 0.0–0.2)

## 2023-07-06 LAB — RENAL FUNCTION PANEL
Albumin: 1.9 g/dL — ABNORMAL LOW (ref 3.5–5.0)
Anion gap: 5 (ref 5–15)
BUN: 60 mg/dL — ABNORMAL HIGH (ref 8–23)
CO2: 32 mmol/L (ref 22–32)
Calcium: 8.8 mg/dL — ABNORMAL LOW (ref 8.9–10.3)
Chloride: 107 mmol/L (ref 98–111)
Creatinine, Ser: 1.04 mg/dL (ref 0.61–1.24)
GFR, Estimated: >60 mL/min (ref >60)
Glucose, Bld: 162 mg/dL — ABNORMAL HIGH (ref 70–99)
Phosphorus: 3 mg/dL (ref 2.5–4.6)
Potassium: 3.4 mmol/L — ABNORMAL LOW (ref 3.5–5.1)
Sodium: 144 mmol/L (ref 135–145)

## 2023-07-06 LAB — GLUCOSE, CAPILLARY
Glucose-Capillary: 162 mg/dL — ABNORMAL HIGH (ref 70–99)
Glucose-Capillary: 171 mg/dL — ABNORMAL HIGH (ref 70–99)
Glucose-Capillary: 163 mg/dL — ABNORMAL HIGH (ref 70–99)
Glucose-Capillary: 158 mg/dL — ABNORMAL HIGH (ref 70–99)
Glucose-Capillary: 128 mg/dL — ABNORMAL HIGH (ref 70–99)

## 2023-07-06 LAB — PHOSPHORUS: Phosphorus: 3 mg/dL (ref 2.5–4.6)

## 2023-07-06 LAB — CULTURE, BLOOD (SINGLE)
Culture: NO GROWTH
Special Requests: ADEQUATE

## 2023-07-06 LAB — CULTURE, BLOOD (ROUTINE X 2)
Culture: NO GROWTH
Special Requests: ADEQUATE

## 2023-07-06 LAB — MAGNESIUM: Magnesium: 2.5 mg/dL — ABNORMAL HIGH (ref 1.7–2.4)

## 2023-07-06 MED ORDER — QUETIAPINE FUMARATE 100 MG PO TABS
100.0000 mg | ORAL_TABLET | Freq: Two times a day (BID) | ORAL | Status: DC
  Filled 2023-07-06: qty 1

## 2023-07-06 MED ORDER — QUETIAPINE FUMARATE 50 MG PO TABS
50.0000 mg | ORAL_TABLET | Freq: Two times a day (BID) | ORAL | Status: DC
  Administered 2023-07-06: 50 mg
  Filled 2023-07-06: qty 1

## 2023-07-06 MED ORDER — POTASSIUM CHLORIDE 20 MEQ PO PACK
40.0000 meq | PACK | Freq: Once | ORAL | Status: AC
  Administered 2023-07-06: 40 meq
  Filled 2023-07-06: qty 2

## 2023-07-06 MED ORDER — LORAZEPAM 0.5 MG PO TABS
0.5000 mg | ORAL_TABLET | Freq: Two times a day (BID) | ORAL | Status: DC
  Administered 2023-07-06 – 2023-07-07 (×4): 0.5 mg
  Filled 2023-07-06 (×4): qty 1

## 2023-07-06 MED ORDER — SERTRALINE HCL 50 MG PO TABS
100.0000 mg | ORAL_TABLET | Freq: Every day | ORAL | Status: DC
  Administered 2023-07-06 – 2023-07-13 (×8): 100 mg
  Filled 2023-07-06 (×8): qty 2

## 2023-07-06 MED ORDER — ENOXAPARIN SODIUM 100 MG/ML IJ SOSY
90.0000 mg | PREFILLED_SYRINGE | Freq: Two times a day (BID) | INTRAMUSCULAR | Status: DC
  Administered 2023-07-06 – 2023-07-07 (×4): 90 mg via SUBCUTANEOUS
  Filled 2023-07-06 (×5): qty 0.9

## 2023-07-06 MED ORDER — FUROSEMIDE 10 MG/ML IJ SOLN
40.0000 mg | INTRAMUSCULAR | Status: DC
  Administered 2023-07-07 – 2023-07-09 (×2): 40 mg via INTRAVENOUS
  Filled 2023-07-06 (×2): qty 4

## 2023-07-06 MED ORDER — FUROSEMIDE 10 MG/ML IJ SOLN: 40.0000 mg | Freq: Every day | INTRAMUSCULAR | Status: DC

## 2023-07-06 NOTE — Progress Notes (Signed)
PT Cancellation Note  Patient Details Name: Joshua Eaton MRN: [id number] DOB: Jun 14, 1956   Cancelled Treatment:    Reason Eval/Treat Not Completed: Medical issues which prohibited therapy. Pt remains sedated at this time. PT will follow up when pt is able to wean sedation to allow for better attempts at participation in evaluation.   [name] 07/06/2023, 11:49 AM

## 2023-07-06 NOTE — Progress Notes (Signed)
[facility] ADULT ICU REPLACEMENT PROTOCOL   The patient does apply for the [facility] Adult ICU Electrolyte Replacment Protocol based on the criteria listed below:   1.Exclusion criteria: TCTS, ECMO, Dialysis, and Myasthenia Gravis patients 2. Is GFR >/= 30 ml/min? Yes.    Patient's GFR today is >60 3. Is SCr </= 2? Yes.   Patient's SCr is 1.04 mg/dL 4. Did SCr increase >/= 0.5 in 24 hours? No. 5.Pt's weight >40kg  Yes.   6. Abnormal electrolyte(s): K+ 3.4  7. Electrolytes replaced per protocol 8.  Call MD STAT for K+ </= 2.5, Phos </= 1, or Mag </= 1 Physician:  Dr. Loralyn Freshwater, Lilia Argue 07/06/2023 5:30 AM

## 2023-07-06 NOTE — Progress Notes (Signed)
NAME:  Joshua Eaton, MRN:  [id number], DOB:  02/09/1956, LOS: 5 ADMISSION DATE:  07/01/2023, CONSULTATION DATE:  07/01/2023 REFERRING MD:  Particia Nearing, EDP CHIEF COMPLAINT:  Acute on chronic hypoxic respiratory failure   History of Present Illness:  Joshua Eaton is a 67 y/o gentleman with a history of respiratory failure and tracheostomy with recent admission from 10/3-10/28 at [facility] after a 2 week admission at [facility] in September for pneumonia complicated by ARDS and acute cardiogenic pulmonary edema. He has severe MR recently diagnosed and is not a candidate for valve replacement due to overall frailty. He underwent tracheostomy and PEG placement during his admission and was transferred to [facility] on 10/28. Later that day he had an accidental tracheostomy dislodgement, and his trach stayed out. Yesterday he developed increased work of breathing and was transferred to the ED for further evaluation. He was trialed on BiPAP but did not tolerate it well and eventually required reintubation in the ED. He underwent CT scan demonstrating possible contained bowel perforation; Ct with enteral contrast has now been ordered and surgery has evaluated him. PCCM was consulted for admission.   Pertinent  Medical History  HTN Respiratory failure requiring trach Afib DVT- provoked by PICC  Significant Hospital Events: Including procedures, antibiotic start and stop dates in addition to other pertinent events   10/3 initially admitted 10/6 trach 10/9 TEE with severe MR with mitral valve perforation 10/11 cardiothoracic surgery, not a surgical candidate  10/18 trach revision after cuff leak> required reintubation to stabilize 10/24 PEG placement 10/28 discharged to [facility], dislodged trach, which was then left out. 10/31 readmitted for respiratory failure,possible contained bowel perforation 10/31 CT chest/abdomen/pelvis shows diffuse airspace disease 11/1 CT abd/pelvis w/ enteral contrast negative for gastric or  enteric leak - the contrast is visible in his stomach, small bowel, and colon to the level of his rectum without extravasation.  11/4 tracheostomy revision  Interim History / Subjective:  Sedated and mechanically ventilated.  Objective   Blood pressure (!) 120/56, pulse 79, temperature 99.6 F (37.6 C), temperature source Oral, resp. rate 19, height 6' 0.99" (1.854 m), weight 92.1 kg, SpO2 100%.    Vent Mode: PRVC FiO2 (%):  [30 %-40 %] 30 % Set Rate:  [20 bmp] 20 bmp Vt Set:  [500 mL] 500 mL PEEP:  [5 cmH20] 5 cmH20 Plateau Pressure:  [14 cmH20-19 cmH20] 14 cmH20   Intake/Output Summary (Last 24 hours) at 07/06/2023 7829 Last data filed at 07/06/2023 0600 Gross per 24 hour  Intake 1590.45 ml  Output 2120 ml  Net -529.55 ml   Filed Weights   07/04/23 0124 07/05/23 0401 07/06/23 0500  Weight: 87.1 kg 92.3 kg 92.1 kg    Examination: General: thin adult male in NAD HENT: [place]/AT, PERRL.  Lungs: Bilateral ventilated breath sounds, no accessory muscle use Cardiovascular: RRR, 4/6 murmur Abdomen: Soft, NT, ND Extremities: No acute deformity or edema.  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  -Per cardiology note from 10/11, family did not want [facility] involved in his care - his  overall status would need to improve to be considered for valve surgery -continue lasix every other day, monitor electrolytes   Anemia due to critical illness , drop from 8.5-7.1 on 11/3 but no evidence of blood loss -transfuse for Hb <7 or hemodynamically significant bleeding -monitor   RUE DVT provoked by PICC -holding AC due to hemoptysis, hemoglobin drop -needs 3 months for provoked clot -can resume eliquis tomorrow if stable hgb on lovenox   Afib, paroxymsal -amiodarone continue -holding eliquis for now, lovenox as above   Thrombocytopenia -Monitor   Hyperglycemia -SSI PRN -goal BG 140-180   ICU delirium EEG negative for seizure -continue Seroquel and increase to 100 mg bid -add home sertraline and ativan -PAD protocol -Propofol and fentanyl for RASS goal 0 to -1.    Deconditioning, debility Severe protein energy malnutrition No evidence of bowel perforation - enteral TF restarted and tolerating   BPH -unable to take tamsulosin until able to take pills -Foley   Perineal moisture associated skin breakdown -Foley, flexiseal     Summary - We have contacted [facility] who has refused transfer on re-admission to ICU.  He is not a candidate for valve surgery given critical illness.  Best course is to redo trach and continue rehab efforts in the hopes that he will liberate from the vent and be a candidate.  Best Practice (right click and "Reselect all SmartList Selections" daily)   Diet/type: tubefeeds DVT prophylaxis: SCD GI prophylaxis: PPI Lines: N/A Foley:  Yes, and it is still needed Code Status:  full code Last date of multidisciplinary goals of care discussion [07/05/2023]  Labs   CBC: Recent Labs  Lab 07/01/23 0500 07/01/23 1812 07/01/23 1817 07/01/23 2203 07/02/23 0015 07/04/23 0534 07/04/23 1144 07/05/23 0710 07/06/23 0339  WBC 17.3*  --  21.8*  --  25.0* 9.6 10.1 9.6 7.1  NEUTROABS 13.8*  --  19.6*  --   --  6.2  --  5.9  --   HGB 12.0*   < >  9.9*   < > 8.5* 7.1* 7.1* 7.6* 7.3*  HCT 37.2*   < > 31.1*   < > 27.2* 23.0* 22.5* 24.4* 23.8*  MCV 101.9*  --  101.3*  --  103.0* 104.5* 105.6* 105.6* 106.3*  PLT 143*  --  132*  --  185 121* 126* 137* 117*   < > = values in this interval not displayed.    Basic Metabolic Panel: Recent Labs  Lab 07/02/23 0015 07/02/23 1735 07/03/23 0332 07/04/23 0534 07/05/23 0710 07/06/23 0339 07/06/23 0340  NA 144  --  142 144 146* 144  --   K 4.8  --  4.0 3.6 4.3 3.4*  --   CL 111  --  106 106 109 107  --   CO2 26  --  28 28 27  32  --   GLUCOSE 160*  --  112* 135* 125* 162*  --   BUN 48*  --  58* 81* 73* 60*  --   CREATININE 1.25*  --  1.50* 1.54* 1.19 1.04  --   CALCIUM 9.3  --  8.4* 8.4* 8.9 8.8*  --   MG 2.6* 2.4 2.7* 2.8* 3.1*  --  2.5*  PHOS 5.0* 4.6 5.1* 4.3 3.2 3.0 3.0   GFR: Estimated  Creatinine Clearance: 75.8 mL/min (by C-G formula based on SCr of 1.04 mg/dL). Recent Labs  Lab 07/01/23 1825 07/02/23 0015 07/04/23 0534 07/04/23 1144 07/05/23 0710 07/06/23 0339  PROCALCITON  --  2.91  --   --   --   --   WBC  --  25.0* 9.6 10.1 9.6 7.1  LATICACIDVEN 1.9  --   --   --   --   --     Liver Function Tests: Recent Labs  Lab 06/30/23 0458 07/01/23 1330 07/01/23 1817 07/06/23 0339  AST 55* 48* 49*  --   ALT 48* 38 37  --   ALKPHOS 69 72 74  --   BILITOT 0.9 1.8* 1.7*  --   PROT 5.7* 6.1* 6.2*  --   ALBUMIN 2.4* 2.5* 2.4* 1.9*   Recent Labs  Lab 07/01/23 1817  LIPASE 26   No results for input(s): "AMMONIA" in the last 168 hours.  ABG    Component Value Date/Time   PHART 7.36 07/02/2023 0145   PCO2ART 46 07/02/2023 0145   PO2ART 120 (H) 07/02/2023 0145   HCO3 26.6 07/02/2023 0145   TCO2 27 07/01/2023 2203   ACIDBASEDEF 2.0 07/01/2023 2203   O2SAT 99.5 07/02/2023 0145     Coagulation Profile: Recent Labs  Lab 07/01/23 1330  INR 1.9*    Cardiac Enzymes: No results for input(s): "CKTOTAL", "CKMB", "CKMBINDEX", "TROPONINI" in the last 168  hours.  HbA1C: Hgb A1c MFr Bld  Date/Time Value Ref Range Status  06/30/2023 04:58 AM 4.1 (L) 4.8 - 5.6 % Final    Comment:    (NOTE) Pre diabetes:          5.7%-6.4%  Diabetes:              >6.4%  Glycemic control for   <7.0% adults with diabetes   12/09/2015 06:30 PM 5.1 4.8 - 5.6 % Final    Comment:    (NOTE)         Pre-diabetes: 5.7 - 6.4         Diabetes: >6.4         Glycemic control for adults with diabetes: <7.0     CBG: Recent Labs  Lab 07/05/23 1126 07/05/23 1543 07/05/23 1936 07/05/23 2323 07/06/23 0320  GLUCAP 124* 147* 127* 141* 162*    Review of Systems:   Unable to obtain  Past Medical History:  He,  has a past medical history of Anxiety, Cancer (HCC), Depression, Hypertension, and OCD (obsessive compulsive disorder).   Surgical History:   Past Surgical History:  Procedure Laterality Date   CHOLECYSTECTOMY     COLON SURGERY     cancerous polyp removed   HERNIA REPAIR     IR GASTROSTOMY TUBE MOD SED  06/24/2023     Social History:   reports that he has never smoked. He has never used smokeless tobacco. He reports that he does not drink alcohol and does not use drugs.   Family History:  His family history is not on file.   Allergies Allergies  Allergen Reactions   Codeine     "sends me into orbit"     Home Medications  Prior to Admission medications   Medication Sig Start Date End Date Taking? Authorizing Provider  acetaminophen (TYLENOL) 325 MG tablet Take 650 mg by mouth every 6 (six) hours as needed for moderate pain.   Yes [provider]  ACIDOPHILUS LACTOBACILLUS PO Place 1 capsule into feeding tube daily.  Yes [provider]  amiodarone (PACERONE) 200 MG tablet Place 1 tablet (200 mg total) into feeding tube 2 (two) times daily for 4 days, THEN 1 tablet (200 mg total) daily. 06/28/23 08/01/23 Yes Rhetta Mura, MD  apixaban (ELIQUIS) 5 MG TABS tablet Place 1 tablet (5 mg total) into feeding tube 2  (two) times daily. 06/28/23  Yes Rhetta Mura, MD  aspirin 81 MG chewable tablet Place 81 mg into feeding tube daily.   Yes [provider]  ceFEPIme 2 g in sodium chloride 0.9 % 100 mL Inject 2 g into the vein every 8 (eight) hours.   Yes [provider]  dextrose (GLUTOSE) 40 % GEL Take 15 g by mouth daily as needed for low blood sugar (70mg /dL).   Yes [provider]  Electrolyte-A in Dextrose (DEXTROSE 50%/ELECTROLYTES IV) Inject 25 g into the vein daily as needed (blood sugar 50mg /dL).   Yes [provider]  feeding supplement (OSMOLITE 1 CAL) LIQD Take 1,000 mLs by mouth daily. 65 mL/hr 06/28/23  Yes Rhetta Mura, MD  furosemide (LASIX) 10 MG/ML injection Inject 10 mg into the muscle once.   Yes [provider]  Glucagon HCl 1 MG SOLR Inject 1 mg as directed once as needed (hypoglycemia).   Yes [provider]  hydrALAZINE (APRESOLINE) 10 MG tablet Place 10 mg into feeding tube every 8 (eight) hours.   Yes [provider]  HYDROcodone-acetaminophen (NORCO/VICODIN) 5-325 MG tablet Take 1 tablet by mouth every 6 (six) hours as needed for moderate pain (pain score 4-6).   Yes [provider]  insulin lispro (HUMALOG) 100 UNIT/ML injection Inject 0-6 Units into the skin 3 (three) times daily before meals. Per Sliding Scale: Blood sugar very low dose <70 initiate hypoglycemic treatment 70-200: No insulin 201-250: 2 units 251-300: 3 units 301-350: 4 units 351-400: 5 units >400: 6 units call medical provider   Yes [provider]  ipratropium-albuterol (DUONEB) 0.5-2.5 (3) MG/3ML SOLN Take 3 mLs by nebulization every 4 (four) hours as needed (sob/wheezing).   Yes [provider]  loperamide (IMODIUM) 2 MG capsule Take 2 mg by mouth in the morning, at noon, and at bedtime.   Yes [provider]  LORazepam (ATIVAN) 0.5 MG tablet Take 1 tablet (0.5 mg total) by mouth in the  morning. Patient taking differently: Take 0.5 mg by mouth every 8 (eight) hours as needed for anxiety. 06/28/23  Yes Rhetta Mura, MD  magnesium oxide (MAG-OX) 400 (240 Mg) MG tablet Take 400 mg by mouth every 6 (six) hours as needed (magnesium electrolyte replacement).   Yes [provider]  methylPREDNISolone sodium succinate (SOLU-MEDROL) 40 mg/mL injection Inject 80 mg into the vein every 8 (eight) hours.   Yes [provider]  metoprolol tartrate (LOPRESSOR) 25 MG tablet Place 0.5 tablets (12.5 mg total) into feeding tube 2 (two) times daily. 06/28/23  Yes Rhetta Mura, MD  metroNIDAZOLE (FLAGYL) 500 MG/100ML Inject 500 mg into the vein every 8 (eight) hours.   Yes [provider]  Morphine Sulfate, PF, 2 MG/ML SOLN Inject 2 mg as directed once as needed (pain.).   Yes [provider]  pantoprazole (PROTONIX) 2 mg/mL suspension Place 40 mg into feeding tube daily.   Yes [provider]  potassium chloride SA (KLOR-CON M) 20 MEQ tablet Take 40 mEq by mouth 2 (two) times daily. Or every 4 hours as needed for potassium electrolyte replacement.   Yes [provider]  QUEtiapine (SEROQUEL)  25 MG tablet Place 1 tablet (25 mg total) into feeding tube 2 (two) times daily. 06/28/23  Yes Rhetta Mura, MD  simethicone (MYLICON) 80 MG chewable tablet Place 80 mg into feeding tube 4 (four) times daily.   Yes [provider]  metroNIDAZOLE (FLAGYL) 500 MG tablet Place 500 mg into feeding tube every 8 (eight) hours. Patient not taking: Reported on 07/02/2023    [provider]  Water For Irrigation, Sterile (FREE WATER) SOLN Place 200 mLs into feeding tube every 6 (six) hours. 06/28/23   Rhetta Mura, MD     Critical care time: 9    Rocky Morel, DO Internal Medicine Resident, PGY-2 Pager# [phone] [facility] 07/06/2023 6:33 AM  For contact information, see Amion. If no  response to pager, please call PCCM consult pager. After hours, 7PM- 7AM, please call Elink.

## 2023-07-06 NOTE — Progress Notes (Signed)
I personally saw the patient and performed a substantive portion of this encounter, including a complete performance of at least one of the key components (MDM, Hx and/or Exam), in conjunction with the resident.   Prolonged mechanical ventilation required redo tracheostomy 11/4 Tolerated procedure well. Remains critically ill, on propofol and fentanyl with intermittent agitation and swatting with his arms, mittens on hands. Low-grade fever 100.1, does not follow commands, S1-S2 regular, clear breath sounds bilateral, no edema  Chest x-ray shows tracheostomy proximal XLT with its tip about 1 cm from carina, bilateral infiltrates persist.  Labs show mild hypokalemia, stable hemoglobin 7.3, no leukocytosis, mild thrombocytopenia  Impression/plan  Acute metabolic encephalopathy -will increase Seroquel to 100, resume low-dose Ativan twice daily and Zoloft which are home meds -Hopefully with this regimen we can taper down propofol and fentanyl, goal RASS 0 to -1 now. -If unable, can switch to Precedex and oral oxycodone Important to control his agitation since he has pulled tracheostomy out twice.  Acute pulmonary edema due to severe MR/moderate AI -renal function has now improved and we can dose Lasix every other day for equal balance.  Right upper extremity PICC related DVT -hemoglobin appears stable will resume anticoagulation with Lovenox initially and if tolerated then switch to Eliquis  Wife contemplating LTAC options, updated her in detail  My independent critical care time was 32 minutes  Marcin Holte V. Vassie Loll MD

## 2023-07-07 DIAGNOSIS — J9602 Acute respiratory failure with hypercapnia: Secondary | ICD-10-CM | POA: Diagnosis not present

## 2023-07-07 DIAGNOSIS — G9341 Metabolic encephalopathy: Secondary | ICD-10-CM | POA: Diagnosis not present

## 2023-07-07 DIAGNOSIS — J9601 Acute respiratory failure with hypoxia: Secondary | ICD-10-CM | POA: Diagnosis not present

## 2023-07-07 DIAGNOSIS — Z930 Tracheostomy status: Secondary | ICD-10-CM | POA: Diagnosis not present

## 2023-07-07 LAB — GLUCOSE, CAPILLARY
Glucose-Capillary: 129 mg/dL — ABNORMAL HIGH (ref 70–99)
Glucose-Capillary: 130 mg/dL — ABNORMAL HIGH (ref 70–99)
Glucose-Capillary: 140 mg/dL — ABNORMAL HIGH (ref 70–99)
Glucose-Capillary: 141 mg/dL — ABNORMAL HIGH (ref 70–99)
Glucose-Capillary: 142 mg/dL — ABNORMAL HIGH (ref 70–99)
Glucose-Capillary: 148 mg/dL — ABNORMAL HIGH (ref 70–99)
Glucose-Capillary: 170 mg/dL — ABNORMAL HIGH (ref 70–99)

## 2023-07-07 LAB — CBC
HCT: 23.1 % — ABNORMAL LOW (ref 39.0–52.0)
Hemoglobin: 7 g/dL — ABNORMAL LOW (ref 13.0–17.0)
MCH: 31.7 pg (ref 26.0–34.0)
MCHC: 30.3 g/dL (ref 30.0–36.0)
MCV: 104.5 fL — ABNORMAL HIGH (ref 80.0–100.0)
Platelets: 128 10*3/uL — ABNORMAL LOW (ref 150–400)
RBC: 2.21 MIL/uL — ABNORMAL LOW (ref 4.22–5.81)
RDW: 17.5 % — ABNORMAL HIGH (ref 11.5–15.5)
WBC: 9.3 10*3/uL (ref 4.0–10.5)
nRBC: 0 % (ref 0.0–0.2)

## 2023-07-07 LAB — RENAL FUNCTION PANEL
Albumin: 1.9 g/dL — ABNORMAL LOW (ref 3.5–5.0)
Anion gap: 5 (ref 5–15)
BUN: 43 mg/dL — ABNORMAL HIGH (ref 8–23)
CO2: 32 mmol/L (ref 22–32)
Calcium: 8.6 mg/dL — ABNORMAL LOW (ref 8.9–10.3)
Chloride: 109 mmol/L (ref 98–111)
Creatinine, Ser: 0.89 mg/dL (ref 0.61–1.24)
GFR, Estimated: >60 mL/min (ref >60)
Glucose, Bld: 133 mg/dL — ABNORMAL HIGH (ref 70–99)
Phosphorus: 2.7 mg/dL (ref 2.5–4.6)
Potassium: 4.1 mmol/L (ref 3.5–5.1)
Sodium: 146 mmol/L — ABNORMAL HIGH (ref 135–145)

## 2023-07-07 LAB — PREPARE RBC (CROSSMATCH)

## 2023-07-07 LAB — HEMOGLOBIN AND HEMATOCRIT, BLOOD
HCT: 26.8 % — ABNORMAL LOW (ref 39.0–52.0)
Hemoglobin: 8.4 g/dL — ABNORMAL LOW (ref 13.0–17.0)

## 2023-07-07 LAB — CULTURE, BLOOD (ROUTINE X 2)
Culture: NO GROWTH
Special Requests: ADEQUATE

## 2023-07-07 LAB — MAGNESIUM: Magnesium: 2.6 mg/dL — ABNORMAL HIGH (ref 1.7–2.4)

## 2023-07-07 MED ORDER — SODIUM CHLORIDE 0.9% IV SOLUTION: Freq: Once | INTRAVENOUS | Status: AC

## 2023-07-07 MED ORDER — DEXMEDETOMIDINE HCL IN NACL 400 MCG/100ML IV SOLN
0.0000 ug/kg/h | INTRAVENOUS | Status: DC
  Administered 2023-07-07: 0.9 ug/kg/h via INTRAVENOUS
  Administered 2023-07-07: 0.6 ug/kg/h via INTRAVENOUS
  Administered 2023-07-07: 0.4 ug/kg/h via INTRAVENOUS
  Administered 2023-07-08: 1.2 ug/kg/h via INTRAVENOUS
  Administered 2023-07-08: 0.6 ug/kg/h via INTRAVENOUS
  Administered 2023-07-08: 0.8 ug/kg/h via INTRAVENOUS
  Administered 2023-07-08: 1.1 ug/kg/h via INTRAVENOUS
  Administered 2023-07-08 – 2023-07-09 (×2): 1 ug/kg/h via INTRAVENOUS
  Administered 2023-07-09 (×2): 1.2 ug/kg/h via INTRAVENOUS
  Administered 2023-07-09: 0.8 ug/kg/h via INTRAVENOUS
  Administered 2023-07-09 – 2023-07-10 (×2): 1.2 ug/kg/h via INTRAVENOUS
  Administered 2023-07-10 (×2): 0.5 ug/kg/h via INTRAVENOUS
  Administered 2023-07-10: 1 ug/kg/h via INTRAVENOUS
  Administered 2023-07-11: 0.5 ug/kg/h via INTRAVENOUS
  Administered 2023-07-11 – 2023-07-12 (×2): 0.6 ug/kg/h via INTRAVENOUS
  Administered 2023-07-12: 0.5 ug/kg/h via INTRAVENOUS
  Administered 2023-07-13: 0.7 ug/kg/h via INTRAVENOUS
  Administered 2023-07-13: 0.6 ug/kg/h via INTRAVENOUS
  Administered 2023-07-13: 0.3 ug/kg/h via INTRAVENOUS
  Filled 2023-07-07 (×7): qty 100
  Filled 2023-07-07: qty 200
  Filled 2023-07-07 (×17): qty 100

## 2023-07-07 MED ORDER — QUETIAPINE FUMARATE 100 MG PO TABS
100.0000 mg | ORAL_TABLET | Freq: Two times a day (BID) | ORAL | Status: DC
  Administered 2023-07-07 – 2023-07-12 (×11): 100 mg
  Filled 2023-07-07 (×11): qty 1

## 2023-07-07 MED ORDER — ACETAMINOPHEN 325 MG PO TABS
650.0000 mg | ORAL_TABLET | Freq: Four times a day (QID) | ORAL | Status: DC | PRN
  Administered 2023-07-07 – 2023-07-12 (×5): 650 mg
  Filled 2023-07-07 (×6): qty 2

## 2023-07-07 NOTE — Progress Notes (Addendum)
I personally saw the patient and performed a substantive portion of this encounter, including a complete performance of at least one of the key components (MDM, Hx and/or Exam), in conjunction with the resident.    Prolonged mechanical ventilation required redo tracheostomy 11/4 Low-grade febrile Remains chronic critically ill , on mechanical ventilation On propofol/fentanyl drips.  Required fentanyl bolus overnight We had readded Zoloft and Ativan yesterday. On exam -eyes open, pupils mid dilated, does not follow commands, keeps moving all extremities and swinging with his left arm, clear breath sounds bilateral minimal secretions, S1-S2 regular  Labs show normal electrolytes, no leukocytosis, hemoglobin drifting down to 7.  Impression/plan Acute respiratory failure with hypoxia -start spontaneous breathing trials, tolerates 5/5 pressure support, can transition to trach collar as tolerated  Acute metabolic encephalopathy -increase Seroquel to 100 twice daily, Long discussion with wife and sister [name] who had some objections to this medication. Continue low-dose Ativan and Zoloft -If this controls his agitation, we can switch to Precedex and oral oxycodone from drips -Maintain mittens since he has pulled his tracheostomy twice before.  Acute pulm edema due to severe MR/moderate AI -continue Lasix 40 mg every other day, will dose today since we are transfusing.  RUE PICC related DVT -continue Lovenox, trend hemoglobin before transitioning to Eliquis  Anemia of critical illness -transfuse 1 unit PRBC to maintain hemoglobin 8 and above.  Pressure ulcer in sacrum stage III and around anus stage II -present on admission, wound care  Wife updated at bedside, extensive discussion with sister yesterday and addressed all her concerns. My independent critical care time was 35 minutes  Monicka Cyran V. Vassie Loll MD

## 2023-07-07 NOTE — Plan of Care (Signed)
  Problem: Respiratory: Goal: Ability to maintain a clear airway and adequate ventilation will improve Outcome: Progressing   Problem: Education: Goal: Ability to describe self-care measures that may prevent or decrease complications (Diabetes Survival Skills Education) will improve Outcome: Progressing   Problem: Fluid Volume: Goal: Ability to maintain a balanced intake and output will improve Outcome: Progressing   Problem: Nutritional: Goal: Maintenance of adequate nutrition will improve Outcome: Progressing   Problem: Skin Integrity: Goal: Risk for impaired skin integrity will decrease Outcome: Progressing   Problem: Education: Goal: Knowledge of General Education information will improve Description: Including pain rating scale, medication(s)/side effects and non-pharmacologic comfort measures Outcome: Progressing

## 2023-07-07 NOTE — Progress Notes (Signed)
PT Cancellation Note  Patient Details Name: Joshua Eaton MRN: [id number] DOB: 05/15/1956   Cancelled Treatment:    Reason Eval/Treat Not Completed: Medical issues which prohibited therapy. Pt remains on a continuous precedex drip. Per MD note the pt is unable to follow commands at this time. PT will follow up at a later time.   [name] 07/07/2023, 11:01 AM

## 2023-07-07 NOTE — Progress Notes (Signed)
NAME:  Joshua Eaton, MRN:  [id number], DOB:  06-Nov-1955, LOS: 6 ADMISSION DATE:  07/01/2023, CONSULTATION DATE:  07/01/2023 REFERRING MD:  Particia Nearing, EDP CHIEF COMPLAINT:  Acute on chronic hypoxic respiratory failure   History of Present Illness:  Joshua Eaton is a 67 y/o gentleman with a history of respiratory failure and tracheostomy with recent admission from 10/3-10/28 at [facility] after a 2 week admission at [facility] in September for pneumonia complicated by ARDS and acute cardiogenic pulmonary edema. He has severe MR recently diagnosed and is not a candidate for valve replacement due to overall frailty. He underwent tracheostomy and PEG placement during his admission and was transferred to [facility] on 10/28. Later that day he had an accidental tracheostomy dislodgement, and his trach stayed out. Yesterday he developed increased work of breathing and was transferred to the ED for further evaluation. He was trialed on BiPAP but did not tolerate it well and eventually required reintubation in the ED. He underwent CT scan demonstrating possible contained bowel perforation; Ct with enteral contrast has now been ordered and surgery has evaluated him. PCCM was consulted for admission.   Pertinent  Medical History  HTN Respiratory failure requiring trach Afib DVT- provoked by PICC  Significant Hospital Events: Including procedures, antibiotic start and stop dates in addition to other pertinent events   10/3 initially admitted 10/6 trach 10/9 TEE with severe MR with mitral valve perforation 10/11 cardiothoracic surgery, not a surgical candidate  10/18 trach revision after cuff leak> required reintubation to stabilize 10/24 PEG placement 10/28 discharged to [facility], dislodged trach, which was then left out. 10/31 readmitted for respiratory failure,possible contained bowel perforation 10/31 CT chest/abdomen/pelvis shows diffuse airspace disease 11/1 CT abd/pelvis w/ enteral contrast negative for gastric or  enteric leak - the contrast is visible in his stomach, small bowel, and colon to the level of his rectum without extravasation.  11/4 tracheostomy revision 11/6 successful SBT, on trach collar  Interim History / Subjective:  Awake but not following commands.   Objective   Blood pressure (!) 95/47, pulse 92, temperature 100.2 F (37.9 C), temperature source Axillary, resp. rate 16, height 6' 0.99" (1.854 m), weight 95.6 kg, SpO2 94%.    Vent Mode: PRVC FiO2 (%):  [30 %] 30 % Set Rate:  [20 bmp] 20 bmp Vt Set:  [470 mL-500 mL] 470 mL PEEP:  [5 cmH20] 5 cmH20 Pressure Support:  [5 cmH20] 5 cmH20 Plateau Pressure:  [14 cmH20-21 cmH20] 20 cmH20   Intake/Output Summary (Last 24 hours) at 07/07/2023 0611 Last data filed at 07/07/2023 0500 Gross per 24 hour  Intake 5483.56 ml  Output 950 ml  Net 4533.56 ml   Filed Weights   07/05/23 0401 07/06/23 0500 07/07/23 0500  Weight: 92.3 kg 92.1 kg 95.6 kg    Examination: General: thin adult male in NAD HENT: [place]/AT, PERRL.  Lungs: Bilateral ventilated breath sounds, no accessory muscle use Cardiovascular: RRR, 4/6 murmur Abdomen: Soft, NT, ND Extremities: No acute deformity or edema.  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  -Per cardiology note from 10/11, family did not want [place] cardiology  involved in his care - his overall status would need to improve to be considered for valve surgery -continue lasix every other day, monitor electrolytes   Anemia due to critical illness , 7.0 today -transfuse 1 unit prbc -transfuse for Hb <8 or hemodynamically significant bleeding -monitor   RUE DVT provoked by PICC -holding AC due to hemoptysis, hemoglobin drop -needs 3 months for provoked clot -can resume eliquis tomorrow if stable hgb on lovenox   Afib, paroxymsal -amiodarone continue -holding eliquis for now, lovenox as above   Thrombocytopenia -Monitor   Hyperglycemia -SSI PRN -goal BG 140-180   ICU delirium EEG negative for seizure -increase Seroquel 100 mg bid - home sertraline and ativan - precedex     Deconditioning, debility Severe protein energy malnutrition No evidence of bowel perforation - enteral TF restarted and tolerating   BPH -unable to take tamsulosin until able to take pills -Foley   Perineal moisture associated skin breakdown -Foley, flexiseal     Summary - We have contacted [facility] who has refused transfer on re-admission to ICU.  He is not a candidate for valve surgery given critical illness.  Best course is to redo trach and continue rehab efforts in the hopes that he will liberate from the vent and be a candidate.  Best Practice (right click and "Reselect all SmartList Selections" daily)   Diet/type: tubefeeds DVT prophylaxis: SCD GI prophylaxis: PPI Lines: N/A Foley:  Yes, and it is still needed Code Status:  full code Last date of multidisciplinary goals of care discussion [07/07/2023]  Labs   CBC: Recent Labs  Lab 07/01/23 0500 07/01/23 1812 07/01/23 1817 07/01/23 2203 07/04/23 0534 07/04/23 1144 07/05/23 0710 07/06/23 0339 07/07/23 0303  WBC 17.3*  --  21.8*   < > 9.6 10.1 9.6 7.1 9.3  NEUTROABS 13.8*  --  19.6*  --  6.2  --  5.9  --   --   HGB 12.0*   < > 9.9*   < > 7.1* 7.1* 7.6* 7.3* 7.0*  HCT 37.2*   < > 31.1*   < >  23.0* 22.5* 24.4* 23.8* 23.1*  MCV 101.9*  --  101.3*   < > 104.5* 105.6* 105.6* 106.3* 104.5*  PLT 143*  --  132*   < > 121* 126* 137* 117* 128*   < > = values in this interval not displayed.    Basic Metabolic Panel: Recent Labs  Lab 07/03/23 0332 07/04/23 0534 07/05/23 0710 07/06/23 0339 07/06/23 0340 07/07/23 0303  NA 142 144 146* 144  --  146*  K 4.0 3.6 4.3 3.4*  --  4.1  CL 106 106 109 107  --  109  CO2 28 28 27  32  --  32  GLUCOSE 112* 135* 125* 162*  --  133*  BUN 58* 81* 73* 60*  --  43*  CREATININE 1.50* 1.54* 1.19 1.04  --  0.89  CALCIUM 8.4* 8.4* 8.9 8.8*  --  8.6*  MG 2.7* 2.8* 3.1*  --  2.5* 2.6*  PHOS 5.1* 4.3 3.2 3.0 3.0 2.7   GFR: Estimated Creatinine Clearance: 88.5 mL/min (by C-G formula based on SCr of 0.89 mg/dL). Recent Labs  Lab 07/01/23 1825 07/02/23 0015 07/04/23 0534 07/04/23 1144 07/05/23 0710 07/06/23 0339 07/07/23 0303  PROCALCITON  --  2.91  --   --   --   --   --   WBC  --  25.0*   < > 10.1 9.6 7.1 9.3  LATICACIDVEN 1.9  --   --   --   --   --   --    < > = values in this interval not displayed.    Liver Function Tests: Recent Labs  Lab 07/01/23 1330 07/01/23 1817 07/06/23 0339 07/07/23 0303  AST 48* 49*  --   --   ALT 38 37  --   --   ALKPHOS 72 74  --   --   BILITOT 1.8* 1.7*  --   --   PROT 6.1* 6.2*  --   --   ALBUMIN 2.5* 2.4* 1.9* 1.9*   Recent Labs  Lab 07/01/23 1817  LIPASE 26   No results for input(s): "AMMONIA" in the last 168 hours.  ABG    Component Value Date/Time   PHART 7.36 07/02/2023 0145   PCO2ART 46 07/02/2023 0145   PO2ART 120 (H) 07/02/2023 0145   HCO3 26.6 07/02/2023 0145   TCO2 27 07/01/2023 2203   ACIDBASEDEF 2.0 07/01/2023 2203   O2SAT 99.5 07/02/2023 0145     Coagulation Profile: Recent Labs  Lab 07/01/23 1330  INR 1.9*    Cardiac Enzymes: No results for input(s): "CKTOTAL", "CKMB", "CKMBINDEX", "TROPONINI" in the last 168 hours.  HbA1C: Hgb A1c MFr Bld  Date/Time Value  Ref Range Status  06/30/2023 04:58 AM 4.1 (L) 4.8 - 5.6 % Final    Comment:    (NOTE) Pre diabetes:          5.7%-6.4%  Diabetes:              >6.4%  Glycemic control for   <7.0% adults with diabetes   12/09/2015 06:30 PM 5.1 4.8 - 5.6 % Final    Comment:    (NOTE)         Pre-diabetes: 5.7 - 6.4         Diabetes: >6.4         Glycemic control for adults with diabetes: <7.0     CBG: Recent Labs  Lab 07/06/23 1117 07/06/23 1515 07/06/23 1951 07/06/23 2359 07/07/23 0344  GLUCAP 163* 158* 128* 142* 129*    Review of Systems:   Unable to obtain  Past Medical History:  He,  has a past medical history of Anxiety, Cancer (HCC), Depression, Hypertension, and OCD (obsessive compulsive disorder).   Surgical History:   Past Surgical History:  Procedure Laterality Date   CHOLECYSTECTOMY     COLON SURGERY     cancerous polyp removed   HERNIA REPAIR     IR GASTROSTOMY TUBE MOD SED  06/24/2023     Social History:   reports that he has never smoked. He has never used smokeless tobacco. He reports that he does not drink alcohol and does not use drugs.   Family History:  His family history is not on file.   Allergies Allergies  Allergen Reactions   Codeine     "sends me into orbit"     Home Medications  Prior to Admission medications   Medication Sig Start Date End Date Taking? Authorizing Provider  acetaminophen (TYLENOL) 325 MG tablet Take 650 mg by mouth every 6 (six) hours as needed for moderate pain.   Yes [provider]  ACIDOPHILUS LACTOBACILLUS PO Place 1 capsule into feeding tube daily.   Yes [provider]  amiodarone (PACERONE) 200 MG tablet Place 1 tablet (200 mg total) into feeding tube 2 (two) times daily for 4 days, THEN 1 tablet (200 mg total) daily. 06/28/23 08/01/23 Yes Rhetta Mura, MD  apixaban (ELIQUIS) 5 MG TABS tablet Place 1 tablet (5 mg total) into feeding tube 2 (two) times daily. 06/28/23  Yes Rhetta Mura,  MD  aspirin 81 MG chewable tablet Place 81 mg into feeding tube daily.   Yes [provider]  ceFEPIme 2 g in sodium chloride 0.9 % 100 mL Inject 2 g into the vein every 8 (eight) hours.   Yes [provider]  dextrose (GLUTOSE) 40 % GEL Take 15 g by mouth daily as needed for low blood sugar (70mg /dL).   Yes [provider]  Electrolyte-A in Dextrose (DEXTROSE 50%/ELECTROLYTES IV) Inject 25 g into the vein daily as needed (blood sugar 50mg /dL).   Yes [provider]  feeding supplement (OSMOLITE 1 CAL) LIQD Take 1,000 mLs by mouth daily. 65 mL/hr 06/28/23  Yes Rhetta Mura, MD  furosemide (LASIX) 10 MG/ML injection Inject 10 mg into the muscle once.   Yes [provider]  Glucagon HCl 1 MG SOLR Inject 1 mg as directed once as needed (hypoglycemia).   Yes [provider]  hydrALAZINE (APRESOLINE) 10 MG tablet Place 10 mg into feeding tube every 8 (eight) hours.   Yes [provider]  HYDROcodone-acetaminophen (NORCO/VICODIN) 5-325 MG tablet Take 1 tablet by mouth every 6 (six) hours as needed for moderate pain (pain score 4-6).   Yes [provider]  insulin lispro (HUMALOG) 100 UNIT/ML injection Inject 0-6 Units into the skin 3 (three) times daily before meals. Per Sliding Scale: Blood sugar very low dose <70 initiate hypoglycemic treatment 70-200: No insulin 201-250: 2 units 251-300: 3 units 301-350: 4 units 351-400: 5 units >400: 6 units call medical provider   Yes [provider]  ipratropium-albuterol (DUONEB) 0.5-2.5 (3) MG/3ML SOLN Take 3 mLs by nebulization every 4 (four) hours as needed (sob/wheezing).   Yes [provider]  loperamide (IMODIUM) 2 MG capsule Take 2 mg by mouth in the morning, at noon, and at bedtime.   Yes [provider]  LORazepam (ATIVAN) 0.5 MG tablet Take 1 tablet (0.5 mg total) by mouth in the morning. Patient taking differently: Take 0.5 mg by mouth every 8  (eight) hours as needed for anxiety. 06/28/23  Yes Rhetta Mura, MD  magnesium oxide (MAG-OX) 400 (240 Mg) MG tablet Take 400 mg by mouth every 6 (six) hours as needed (magnesium electrolyte replacement).   Yes [provider]  methylPREDNISolone sodium succinate (SOLU-MEDROL) 40 mg/mL injection Inject 80 mg into the vein every 8 (eight) hours.   Yes [provider]  metoprolol tartrate (LOPRESSOR) 25 MG tablet Place 0.5 tablets (12.5 mg total) into feeding tube 2 (two) times daily. 06/28/23  Yes Rhetta Mura, MD  metroNIDAZOLE (FLAGYL) 500 MG/100ML Inject 500 mg into the vein every 8 (eight) hours.   Yes [provider]  Morphine Sulfate, PF, 2 MG/ML SOLN Inject 2 mg as directed once as needed (pain.).   Yes [provider]  pantoprazole (PROTONIX) 2 mg/mL suspension Place 40 mg into feeding tube daily.   Yes [provider]  potassium chloride SA (KLOR-CON M) 20 MEQ tablet Take 40 mEq by mouth 2 (two) times daily. Or every 4 hours as needed for potassium electrolyte replacement.   Yes [provider]  QUEtiapine (SEROQUEL) 25 MG tablet  Place 1 tablet (25 mg total) into feeding tube 2 (two) times daily. 06/28/23  Yes Rhetta Mura, MD  simethicone (MYLICON) 80 MG chewable tablet Place 80 mg into feeding tube 4 (four) times daily.   Yes [provider]  metroNIDAZOLE (FLAGYL) 500 MG tablet Place 500 mg into feeding tube every 8 (eight) hours. Patient not taking: Reported on 07/02/2023    [provider]  Water For Irrigation, Sterile (FREE WATER) SOLN Place 200 mLs into feeding tube every 6 (six) hours. 06/28/23   Rhetta Mura, MD     Critical care time: 63    Rocky Morel, DO Internal Medicine Resident, PGY-2 Pager# [phone] [place] [facility] 07/07/2023 6:11 AM  For contact information, see Amion. If no response to pager, please call PCCM consult pager. After hours, 7PM-  7AM, please call Elink.

## 2023-07-08 DIAGNOSIS — G9340 Encephalopathy, unspecified: Secondary | ICD-10-CM | POA: Diagnosis not present

## 2023-07-08 DIAGNOSIS — J9621 Acute and chronic respiratory failure with hypoxia: Secondary | ICD-10-CM | POA: Diagnosis not present

## 2023-07-08 DIAGNOSIS — J9622 Acute and chronic respiratory failure with hypercapnia: Secondary | ICD-10-CM | POA: Diagnosis not present

## 2023-07-08 DIAGNOSIS — Z930 Tracheostomy status: Secondary | ICD-10-CM | POA: Diagnosis not present

## 2023-07-08 LAB — TYPE AND SCREEN
ABO/RH(D): O POS
Antibody Screen: NEGATIVE
Unit division: 0

## 2023-07-08 LAB — GLUCOSE, CAPILLARY
Glucose-Capillary: 156 mg/dL — ABNORMAL HIGH (ref 70–99)
Glucose-Capillary: 173 mg/dL — ABNORMAL HIGH (ref 70–99)
Glucose-Capillary: 154 mg/dL — ABNORMAL HIGH (ref 70–99)
Glucose-Capillary: 162 mg/dL — ABNORMAL HIGH (ref 70–99)
Glucose-Capillary: 177 mg/dL — ABNORMAL HIGH (ref 70–99)
Glucose-Capillary: 105 mg/dL — ABNORMAL HIGH (ref 70–99)

## 2023-07-08 LAB — RENAL FUNCTION PANEL
Albumin: 1.8 g/dL — ABNORMAL LOW (ref 3.5–5.0)
Anion gap: 7 (ref 5–15)
BUN: 46 mg/dL — ABNORMAL HIGH (ref 8–23)
CO2: 32 mmol/L (ref 22–32)
Calcium: 8.3 mg/dL — ABNORMAL LOW (ref 8.9–10.3)
Chloride: 106 mmol/L (ref 98–111)
Creatinine, Ser: 0.88 mg/dL (ref 0.61–1.24)
GFR, Estimated: >60 mL/min (ref >60)
Glucose, Bld: 134 mg/dL — ABNORMAL HIGH (ref 70–99)
Phosphorus: 1.9 mg/dL — ABNORMAL LOW (ref 2.5–4.6)
Potassium: 3.8 mmol/L (ref 3.5–5.1)
Sodium: 145 mmol/L (ref 135–145)

## 2023-07-08 LAB — CBC
HCT: 25.9 % — ABNORMAL LOW (ref 39.0–52.0)
Hemoglobin: 8.1 g/dL — ABNORMAL LOW (ref 13.0–17.0)
MCH: 31.4 pg (ref 26.0–34.0)
MCHC: 31.3 g/dL (ref 30.0–36.0)
MCV: 100.4 fL — ABNORMAL HIGH (ref 80.0–100.0)
Platelets: 124 10*3/uL — ABNORMAL LOW (ref 150–400)
RBC: 2.58 MIL/uL — ABNORMAL LOW (ref 4.22–5.81)
RDW: 17.6 % — ABNORMAL HIGH (ref 11.5–15.5)
WBC: 10.8 10*3/uL — ABNORMAL HIGH (ref 4.0–10.5)
nRBC: 0.3 % — ABNORMAL HIGH (ref 0.0–0.2)

## 2023-07-08 LAB — MAGNESIUM: Magnesium: 2.5 mg/dL — ABNORMAL HIGH (ref 1.7–2.4)

## 2023-07-08 LAB — BPAM RBC
Blood Product Expiration Date: 202411272359
ISSUE DATE / TIME: 202411061359
Unit Type and Rh: 5100

## 2023-07-08 LAB — TRIGLYCERIDES: Triglycerides: 53 mg/dL (ref <150)

## 2023-07-08 MED ORDER — LORAZEPAM 1 MG PO TABS
1.0000 mg | ORAL_TABLET | Freq: Two times a day (BID) | ORAL | Status: DC
  Administered 2023-07-08 – 2023-07-10 (×5): 1 mg
  Filled 2023-07-08 (×5): qty 1

## 2023-07-08 MED ORDER — FUROSEMIDE 10 MG/ML IJ SOLN
40.0000 mg | Freq: Once | INTRAMUSCULAR | Status: AC
  Administered 2023-07-08: 40 mg via INTRAVENOUS
  Filled 2023-07-08: qty 4

## 2023-07-08 MED ORDER — HYDRALAZINE HCL 10 MG PO TABS
10.0000 mg | ORAL_TABLET | Freq: Three times a day (TID) | ORAL | Status: DC
  Administered 2023-07-08 – 2023-07-13 (×15): 10 mg
  Filled 2023-07-08 (×19): qty 1

## 2023-07-08 MED ORDER — ORAL CARE MOUTH RINSE
15.0000 mL | OROMUCOSAL | Status: DC
  Administered 2023-07-08 – 2023-07-13 (×22): 15 mL via OROMUCOSAL

## 2023-07-08 MED ORDER — ORAL CARE MOUTH RINSE: 15.0000 mL | OROMUCOSAL | Status: DC | PRN

## 2023-07-08 MED ORDER — ALBUTEROL SULFATE (2.5 MG/3ML) 0.083% IN NEBU
INHALATION_SOLUTION | RESPIRATORY_TRACT | Status: AC
  Filled 2023-07-08: qty 3

## 2023-07-08 MED ORDER — POTASSIUM PHOSPHATES 15 MMOLE/5ML IV SOLN
30.0000 mmol | Freq: Once | INTRAVENOUS | Status: AC
  Administered 2023-07-08: 30 mmol via INTRAVENOUS
  Filled 2023-07-08: qty 10

## 2023-07-08 MED ORDER — APIXABAN 5 MG PO TABS
5.0000 mg | ORAL_TABLET | Freq: Two times a day (BID) | ORAL | Status: DC
  Administered 2023-07-08 – 2023-07-13 (×11): 5 mg
  Filled 2023-07-08 (×11): qty 1

## 2023-07-08 NOTE — Progress Notes (Signed)
Pt placed back on full vent support due to increased WO, tachypnea, and desat. Pt tolerating well at this time, RN at bedside,CCM MD aware, RT will monitor as needed.     07/08/23 0830  Therapy Vitals  Pulse Rate 94  Resp (!) 38  BP (!) 130/114  MEWS Score/Color  MEWS Score 6  MEWS Score Color Red  Respiratory Assessment  Assessment Type Assess only  Respiratory Pattern Labored;Tachypnea;Regular  Chest Assessment Chest expansion symmetrical  Cough Productive  Sputum Amount Moderate  Sputum Color Tan  Sputum Consistency Thick  Sputum Specimen Source Tracheostomy tube  Bilateral Breath Sounds Diminished;Rhonchi  Oxygen Therapy/Pulse Ox  O2 Device Ventilator  O2 Therapy Oxygen  FiO2 (%) 60 %  SpO2 93 %  Tracheostomy Shiley XLT Proximal 8 mm Cuffed  Placement Date/Time: 07/05/23 1043   Placed By: (c) ICU physician  Brand: Shiley XLT Proximal  Size (mm): 8 mm  Style: Cuffed  Status Secured with trach ties and sutures  Site Assessment Clean;Dry  Site Care Cleansed;Dried  Inner Cannula Care Changed/new  Ties Assessment Clean, Dry  Cuff Pressure (cm H2O) Clear OR 27-39 CmH2O  Tracheostomy Equipment at bedside Yes and checklist posted at head of bed

## 2023-07-08 NOTE — Progress Notes (Signed)
I personally saw the patient and performed a substantive portion of this encounter, including a complete performance of at least one of the key components (MDM, Hx and/or Exam), in conjunction with the resident.    Prolonged mechanical ventilation required redo tracheostomy 11/4  Chronically ill, tolerated trach collar for 24 hours but developed distress this morning and back on ventilator. Remains intermittently agitated on Precedex drip although has not required any bolus overnight. Keep swinging with his left arm, does not follow commands, eyes open, bilateral ventilated breath sounds, S1-S2 regular, no edema  Labs show normal electrolytes except for low Phos, no leukocytosis, stable thrombocytopenia, increase in hemoglobin to 8.1 posttransfusion   Impression/plan Acute on chronic respiratory failure -status post tracheostomy, continue weaning efforts with goal of trach collar as tolerated.  Acute metabolic encephalopathy -continue regimen of Seroquel 100 twice daily, increase Ativan to 1 twice daily, continue Zoloft. Use Precedex as needed, goal RASS 0 to -1 Maintain mittens  RUE PICC related DVT -transition from Lovenox to Eliquis  Acute pulmonary edema due to severe MR. Moderate AI -continue Lasix every other day and as needed  Pressure ulcers present on admission -wound care.  Wife updated at bedside, sister updated on the phone, they want to give him over the weekend they understand prognosis is poor for full recovery, they understand that he may require some sort of facility, they may be agreeable to [facility] (in spite of prior experience] based on how he does over the next few days  My independent critical care time was 35 minutes  Katelee Schupp V. Vassie Loll MD

## 2023-07-08 NOTE — TOC Progression Note (Signed)
Transition of Care [facility]) - Progression Note    Patient Details  Name: Joshua Eaton MRN: [id number] Date of Birth: [birth year]  Transition of Care [facility]) CM/SW Contact  Harriet Masson, RN Phone Number: 07/08/2023, 1:36 PM  Clinical Narrative:    1130-Spoke to patient's wife and sister, [name] regarding transition needs. Wife requested to speak to [facility] liaison , Eavn, regarding readmit to [facility].   1329-Evan with [facility] updated this RNCM on the conversation with patient's wife, [name].  Clayburn Pert stated He answered wife's questions.   1340- This RNCM left VM with wife to get approval to start insurance auth for LTAC. Awaiting return call.       Expected Discharge Plan: Long Term Acute Care (LTAC) Barriers to Discharge: Continued Medical Work up  Expected Discharge Plan and Services                                               Social Determinants of Health (SDOH) Interventions SDOH Screenings   Food Insecurity: Patient Unable To Answer (06/29/2023)   Received from [facility]  Transportation Needs: Patient Unable To Answer (06/29/2023)   Received from [facility]  Utilities: Patient Unable To Answer (06/29/2023)   Received from [facility]  Financial Resource Strain: Patient Unable To Answer (06/29/2023)   Received from [facility]  Social Connections: Patient Unable To Answer (06/29/2023)   Received from [facility]  Stress: Patient Unable To Answer (06/29/2023)   Received from [facility]  Tobacco Use: Low Risk  (07/01/2023)    Readmission Risk Interventions     No data to display

## 2023-07-08 NOTE — Progress Notes (Signed)
NAME:  Joshua Eaton, MRN:  [id number], DOB:  1955/10/20, LOS: 7 ADMISSION DATE:  07/01/2023, CONSULTATION DATE:  07/01/2023 REFERRING MD:  Particia Nearing, EDP CHIEF COMPLAINT:  Acute on chronic hypoxic respiratory failure   History of Present Illness:  Joshua Eaton is a 67 y/o gentleman with a history of respiratory failure and tracheostomy with recent admission from 10/3-10/28 at [facility] after a 2 week admission at [facility] in September for pneumonia complicated by ARDS and acute cardiogenic pulmonary edema. He has severe MR recently diagnosed and is not a candidate for valve replacement due to overall frailty. He underwent tracheostomy and PEG placement during his admission and was transferred to [facility] on 10/28. Later that day he had an accidental tracheostomy dislodgement, and his trach stayed out. Yesterday he developed increased work of breathing and was transferred to the ED for further evaluation. He was trialed on BiPAP but did not tolerate it well and eventually required reintubation in the ED. He underwent CT scan demonstrating possible contained bowel perforation; Ct with enteral contrast has now been ordered and surgery has evaluated him. PCCM was consulted for admission.   Pertinent  Medical History  HTN Respiratory failure requiring trach Afib DVT- provoked by PICC  Significant Hospital Events: Including procedures, antibiotic start and stop dates in addition to other pertinent events   10/3 initially admitted 10/6 trach 10/9 TEE with severe MR with mitral valve perforation 10/11 cardiothoracic surgery, not a surgical candidate  10/18 trach revision after cuff leak> required reintubation to stabilize 10/24 PEG placement 10/28 discharged to [facility], dislodged trach, which was then left out. 10/31 readmitted for respiratory failure,possible contained bowel perforation 10/31 CT chest/abdomen/pelvis shows diffuse airspace disease 11/1 CT abd/pelvis w/ enteral contrast negative for gastric or  enteric leak - the contrast is visible in his stomach, small bowel, and colon to the level of his rectum without extravasation.  11/4 tracheostomy revision 11/6 successful SBT, on trach collar 11/7 back on ventilator after tachypnea   Interim History / Subjective:  Awake but not following commands.   Objective   Blood pressure (!) 153/56, pulse 66, temperature 99.1 F (37.3 C), temperature source Oral, resp. rate (!) 25, height 6' 0.99" (1.854 m), weight 95.6 kg, SpO2 99%.    Vent Mode: CPAP;PSV FiO2 (%):  [30 %-40 %] 40 % PEEP:  [5 cmH20] 5 cmH20 Pressure Support:  [5 cmH20] 5 cmH20   Intake/Output Summary (Last 24 hours) at 07/08/2023 7829 Last data filed at 07/08/2023 0200 Gross per 24 hour  Intake 1969.56 ml  Output 2015 ml  Net -45.44 ml   Filed Weights   07/05/23 0401 07/06/23 0500 07/07/23 0500  Weight: 92.3 kg 92.1 kg 95.6 kg    Examination: General: thin adult male in NAD HENT: [place]/AT, PERRL.  Lungs: Bilateral ventilated breath sounds, no accessory muscle use Cardiovascular: RRR, 4/6 murmur Abdomen: Soft, NT, ND Extremities: No acute deformity or edema.  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  -Per cardiology note from 10/11, family did not want Joshua Eaton cardiology involved in his  care - his overall status would need to improve to be considered for valve surgery -continue lasix   Anemia due to critical illness , 8.1 today -transfuse for Hb <8 or hemodynamically significant bleeding -monitor  Hypertension Bp up when off sedation. Continue holding scheduled antihypertensives Hydralazine prn  RUE DVT provoked by PICC -needs 3 months for provoked clot -resume eliquis    Afib, paroxymsal -amiodarone continue -resume eliquis, d/c lovenox   Thrombocytopenia -Monitor   Hyperglycemia -SSI PRN -goal BG 140-180   ICU delirium EEG negative for seizure -Seroquel 100 mg bid - home sertraline - increase ativan - precedex     Deconditioning, debility Severe protein energy malnutrition No evidence of bowel perforation - enteral TF restarted and tolerating   BPH -unable to take tamsulosin until able to take pills -Foley   Perineal moisture associated skin breakdown -Foley, flexiseal  Best Practice (right click and "Reselect all SmartList Selections" daily)   Diet/type: tubefeeds DVT prophylaxis: DOAC GI prophylaxis: PPI Lines: N/A Foley:  Yes, and it is still needed Code Status:  full code Last date of multidisciplinary goals of care discussion [07/08/2023]  Labs   CBC: Recent Labs  Lab 07/01/23 1817 07/01/23 2203 07/04/23 0534 07/04/23 1144 07/05/23 0710 07/06/23 0339 07/07/23 0303 07/07/23 2125 07/08/23 0318  WBC 21.8*   < > 9.6 10.1 9.6 7.1 9.3  --  10.8*  NEUTROABS 19.6*  --  6.2  --  5.9  --   --   --   --   HGB 9.9*   < > 7.1* 7.1* 7.6* 7.3* 7.0* 8.4* 8.1*  HCT 31.1*   < > 23.0* 22.5* 24.4* 23.8* 23.1* 26.8* 25.9*  MCV 101.3*   < > 104.5* 105.6* 105.6* 106.3* 104.5*  --  100.4*  PLT 132*   < > 121* 126* 137* 117* 128*  --  124*   < > = values in this interval not displayed.    Basic Metabolic Panel: Recent Labs  Lab 07/04/23 0534 07/05/23 0710 07/06/23 0339 07/06/23 0340 07/07/23 0303 07/08/23 0318  NA 144 146* 144  --   146* 145  K 3.6 4.3 3.4*  --  4.1 3.8  CL 106 109 107  --  109 106  CO2 28 27 32  --  32 32  GLUCOSE 135* 125* 162*  --  133* 134*  BUN 81* 73* 60*  --  43* 46*  CREATININE 1.54* 1.19 1.04  --  0.89 0.88  CALCIUM 8.4* 8.9 8.8*  --  8.6* 8.3*  MG 2.8* 3.1*  --  2.5* 2.6* 2.5*  PHOS 4.3 3.2 3.0 3.0 2.7 1.9*   GFR: Estimated Creatinine Clearance: 89.5 mL/min (by C-G formula based on SCr of 0.88 mg/dL). Recent Labs  Lab 07/01/23 1825 07/02/23 0015 07/04/23 0534 07/05/23 0710 07/06/23 0339 07/07/23 0303 07/08/23 0318  PROCALCITON  --  2.91  --   --   --   --   --   WBC  --  25.0*   < > 9.6 7.1 9.3 10.8*  LATICACIDVEN 1.9  --   --   --   --   --   --    < > = values in this interval not displayed.    Liver Function Tests: Recent Labs  Lab 07/01/23 1330 07/01/23 1817 07/06/23 1610  07/07/23 0303 07/08/23 0318  AST 48* 49*  --   --   --   ALT 38 37  --   --   --   ALKPHOS 72 74  --   --   --   BILITOT 1.8* 1.7*  --   --   --   PROT 6.1* 6.2*  --   --   --   ALBUMIN 2.5* 2.4* 1.9* 1.9* 1.8*   Recent Labs  Lab 07/01/23 1817  LIPASE 26   No results for input(s): "AMMONIA" in the last 168 hours.  ABG    Component Value Date/Time   PHART 7.36 07/02/2023 0145   PCO2ART 46 07/02/2023 0145   PO2ART 120 (H) 07/02/2023 0145   HCO3 26.6 07/02/2023 0145   TCO2 27 07/01/2023 2203   ACIDBASEDEF 2.0 07/01/2023 2203   O2SAT 99.5 07/02/2023 0145     Coagulation Profile: Recent Labs  Lab 07/01/23 1330  INR 1.9*    Cardiac Enzymes: No results for input(s): "CKTOTAL", "CKMB", "CKMBINDEX", "TROPONINI" in the last 168 hours.  HbA1C: Hgb A1c MFr Bld  Date/Time Value Ref Range Status  06/30/2023 04:58 AM 4.1 (L) 4.8 - 5.6 % Final    Comment:    (NOTE) Pre diabetes:          5.7%-6.4%  Diabetes:              >6.4%  Glycemic control for   <7.0% adults with diabetes   12/09/2015 06:30 PM 5.1 4.8 - 5.6 % Final    Comment:    (NOTE)         Pre-diabetes: 5.7 - 6.4          Diabetes: >6.4         Glycemic control for adults with diabetes: <7.0     CBG: Recent Labs  Lab 07/07/23 1145 07/07/23 1554 07/07/23 1932 07/07/23 2329 07/08/23 0405  GLUCAP 170* 140* 141* 130* 156*    Review of Systems:   Unable to obtain  Past Medical History:  He,  has a past medical history of Anxiety, Cancer (HCC), Depression, Hypertension, and OCD (obsessive compulsive disorder).   Surgical History:   Past Surgical History:  Procedure Laterality Date   CHOLECYSTECTOMY     COLON SURGERY     cancerous polyp removed   HERNIA REPAIR     IR GASTROSTOMY TUBE MOD SED  06/24/2023     Social History:   reports that he has never smoked. He has never used smokeless tobacco. He reports that he does not drink alcohol and does not use drugs.   Family History:  His family history is not on file.   Allergies Allergies  Allergen Reactions   Codeine     "sends me into orbit"     Home Medications  Prior to Admission medications   Medication Sig Start Date End Date Taking? Authorizing Provider  acetaminophen (TYLENOL) 325 MG tablet Take 650 mg by mouth every 6 (six) hours as needed for moderate pain.   Yes [provider]  ACIDOPHILUS LACTOBACILLUS PO Place 1 capsule into feeding tube daily.   Yes [provider]  amiodarone (PACERONE) 200 MG tablet Place 1 tablet (200 mg total) into feeding tube 2 (two) times daily for 4 days, THEN 1 tablet (200 mg total) daily. 06/28/23 08/01/23 Yes Rhetta Mura, MD  apixaban (ELIQUIS) 5 MG TABS tablet Place 1 tablet (5 mg total) into feeding tube 2 (two) times daily. 06/28/23  Yes Samtani,  Jai-Gurmukh, MD  aspirin 81 MG chewable tablet Place 81 mg into feeding tube daily.   Yes [provider]  ceFEPIme 2 g in sodium chloride 0.9 % 100 mL Inject 2 g into the vein every 8 (eight) hours.   Yes [provider]  dextrose (GLUTOSE) 40 % GEL Take 15 g by mouth daily as needed for low blood sugar  (70mg /dL).   Yes [provider]  Electrolyte-A in Dextrose (DEXTROSE 50%/ELECTROLYTES IV) Inject 25 g into the vein daily as needed (blood sugar 50mg /dL).   Yes [provider]  feeding supplement (OSMOLITE 1 CAL) LIQD Take 1,000 mLs by mouth daily. 65 mL/hr 06/28/23  Yes Rhetta Mura, MD  furosemide (LASIX) 10 MG/ML injection Inject 10 mg into the muscle once.   Yes [provider]  Glucagon HCl 1 MG SOLR Inject 1 mg as directed once as needed (hypoglycemia).   Yes [provider]  hydrALAZINE (APRESOLINE) 10 MG tablet Place 10 mg into feeding tube every 8 (eight) hours.   Yes [provider]  HYDROcodone-acetaminophen (NORCO/VICODIN) 5-325 MG tablet Take 1 tablet by mouth every 6 (six) hours as needed for moderate pain (pain score 4-6).   Yes [provider]  insulin lispro (HUMALOG) 100 UNIT/ML injection Inject 0-6 Units into the skin 3 (three) times daily before meals. Per Sliding Scale: Blood sugar very low dose <70 initiate hypoglycemic treatment 70-200: No insulin 201-250: 2 units 251-300: 3 units 301-350: 4 units 351-400: 5 units >400: 6 units call medical provider   Yes [provider]  ipratropium-albuterol (DUONEB) 0.5-2.5 (3) MG/3ML SOLN Take 3 mLs by nebulization every 4 (four) hours as needed (sob/wheezing).   Yes [provider]  loperamide (IMODIUM) 2 MG capsule Take 2 mg by mouth in the morning, at noon, and at bedtime.   Yes [provider]  LORazepam (ATIVAN) 0.5 MG tablet Take 1 tablet (0.5 mg total) by mouth in the morning. Patient taking differently: Take 0.5 mg by mouth every 8 (eight) hours as needed for anxiety. 06/28/23  Yes Rhetta Mura, MD  magnesium oxide (MAG-OX) 400 (240 Mg) MG tablet Take 400 mg by mouth every 6 (six) hours as needed (magnesium electrolyte replacement).   Yes [provider]  methylPREDNISolone sodium succinate (SOLU-MEDROL) 40 mg/mL  injection Inject 80 mg into the vein every 8 (eight) hours.   Yes [provider]  metoprolol tartrate (LOPRESSOR) 25 MG tablet Place 0.5 tablets (12.5 mg total) into feeding tube 2 (two) times daily. 06/28/23  Yes Rhetta Mura, MD  metroNIDAZOLE (FLAGYL) 500 MG/100ML Inject 500 mg into the vein every 8 (eight) hours.   Yes [provider]  Morphine Sulfate, PF, 2 MG/ML SOLN Inject 2 mg as directed once as needed (pain.).   Yes [provider]  pantoprazole (PROTONIX) 2 mg/mL suspension Place 40 mg into feeding tube daily.   Yes [provider]  potassium chloride SA (KLOR-CON M) 20 MEQ tablet Take 40 mEq by mouth 2 (two) times daily. Or every 4 hours as needed for potassium electrolyte replacement.   Yes [provider]  QUEtiapine (SEROQUEL) 25 MG tablet Place 1 tablet (25 mg total) into feeding tube 2 (two) times daily. 06/28/23  Yes Rhetta Mura, MD  simethicone (MYLICON) 80 MG chewable tablet Place 80 mg into feeding tube 4 (four) times daily.   Yes [provider]  metroNIDAZOLE (FLAGYL) 500 MG tablet Place 500 mg into feeding tube every 8 (eight) hours. Patient not  taking: Reported on 07/02/2023    [provider]  Water For Irrigation, Sterile (FREE WATER) SOLN Place 200 mLs into feeding tube every 6 (six) hours. 06/28/23   Rhetta Mura, MD     Critical care time: 58    Rocky Morel, DO Internal Medicine Resident, PGY-2 Pager# [phone] [facility] 07/08/2023 6:33 AM  For contact information, see Amion. If no response to pager, please call PCCM consult pager. After hours, 7PM- 7AM, please call Elink.

## 2023-07-08 NOTE — Progress Notes (Signed)
[facility] ADULT ICU REPLACEMENT PROTOCOL   The patient does apply for the [facility] Adult ICU Electrolyte Replacment Protocol based on the criteria listed below:   1.Exclusion criteria: TCTS, ECMO, Dialysis, and Myasthenia Gravis patients 2. Is GFR >/= 30 ml/min? Yes.    Patient's GFR today is >60 3. Is SCr </= 2? Yes.   Patient's SCr is 0.88 mg/dL 4. Did SCr increase >/= 0.5 in 24 hours? No. 5.Pt's weight >40kg  Yes.   6. Abnormal electrolyte(s):   Phos 1.9  7. Electrolytes replaced per protocol 8.  Call MD STAT for K+ </= 2.5, Phos </= 1, or Mag </= 1 Physician:  A. Doree Fudge Briseida Gittings 07/08/2023 6:51 AM

## 2023-07-08 NOTE — Progress Notes (Signed)
PT Cancellation Note  Patient Details Name: Keithon Mccoin MRN: [id number] DOB: 11/15/1955   Cancelled Treatment:    Reason Eval/Treat Not Completed: Medical issues which prohibited therapy  Awake but restless, not following commands. Remains on continuous Precedex.  Family present, educated on ROM exercises. All questions answered.  Per dept guidelines, will sign-off from acute PT service for now, due to 3rd unsuccessful attempt to evaluate/treat patient.  Please re-order as appropriate when pt able to tolerate PT evaluation.  Kathlyn Sacramento, PT, DPT [facility]  Rehabilitation Services Physical Therapist Office: [phone] Website: [email or web address]  Berton Mount 07/08/2023, 9:16 AM

## 2023-07-09 DIAGNOSIS — Z9911 Dependence on respirator [ventilator] status: Secondary | ICD-10-CM | POA: Diagnosis not present

## 2023-07-09 DIAGNOSIS — R339 Retention of urine, unspecified: Secondary | ICD-10-CM

## 2023-07-09 DIAGNOSIS — G9341 Metabolic encephalopathy: Secondary | ICD-10-CM | POA: Diagnosis not present

## 2023-07-09 DIAGNOSIS — J15212 Pneumonia due to Methicillin resistant Staphylococcus aureus: Secondary | ICD-10-CM | POA: Diagnosis not present

## 2023-07-09 DIAGNOSIS — Z930 Tracheostomy status: Secondary | ICD-10-CM | POA: Diagnosis not present

## 2023-07-09 LAB — RENAL FUNCTION PANEL
Albumin: 1.6 g/dL — ABNORMAL LOW (ref 3.5–5.0)
Anion gap: 9 (ref 5–15)
BUN: 56 mg/dL — ABNORMAL HIGH (ref 8–23)
CO2: 31 mmol/L (ref 22–32)
Calcium: 7.5 mg/dL — ABNORMAL LOW (ref 8.9–10.3)
Chloride: 104 mmol/L (ref 98–111)
Creatinine, Ser: 0.87 mg/dL (ref 0.61–1.24)
GFR, Estimated: >60 mL/min (ref >60)
Glucose, Bld: 140 mg/dL — ABNORMAL HIGH (ref 70–99)
Phosphorus: 3.1 mg/dL (ref 2.5–4.6)
Potassium: 4.6 mmol/L (ref 3.5–5.1)
Sodium: 144 mmol/L (ref 135–145)

## 2023-07-09 LAB — CBC
HCT: 26.4 % — ABNORMAL LOW (ref 39.0–52.0)
Hemoglobin: 8.5 g/dL — ABNORMAL LOW (ref 13.0–17.0)
MCH: 32.2 pg (ref 26.0–34.0)
MCHC: 32.2 g/dL (ref 30.0–36.0)
MCV: 100 fL (ref 80.0–100.0)
Platelets: 130 10*3/uL — ABNORMAL LOW (ref 150–400)
RBC: 2.64 MIL/uL — ABNORMAL LOW (ref 4.22–5.81)
RDW: 17 % — ABNORMAL HIGH (ref 11.5–15.5)
WBC: 10.1 10*3/uL (ref 4.0–10.5)
nRBC: 0.3 % — ABNORMAL HIGH (ref 0.0–0.2)

## 2023-07-09 LAB — GLUCOSE, CAPILLARY
Glucose-Capillary: 158 mg/dL — ABNORMAL HIGH (ref 70–99)
Glucose-Capillary: 176 mg/dL — ABNORMAL HIGH (ref 70–99)
Glucose-Capillary: 121 mg/dL — ABNORMAL HIGH (ref 70–99)
Glucose-Capillary: 158 mg/dL — ABNORMAL HIGH (ref 70–99)
Glucose-Capillary: 176 mg/dL — ABNORMAL HIGH (ref 70–99)
Glucose-Capillary: 107 mg/dL — ABNORMAL HIGH (ref 70–99)

## 2023-07-09 LAB — MAGNESIUM: Magnesium: 2.4 mg/dL (ref 1.7–2.4)

## 2023-07-09 MED ORDER — FENTANYL CITRATE PF 50 MCG/ML IJ SOSY
12.5000 ug | PREFILLED_SYRINGE | INTRAMUSCULAR | Status: DC | PRN
  Administered 2023-07-09 – 2023-07-12 (×4): 25 ug via INTRAVENOUS
  Filled 2023-07-09 (×4): qty 1

## 2023-07-09 MED ORDER — FUROSEMIDE 10 MG/ML IJ SOLN
60.0000 mg | Freq: Every day | INTRAMUSCULAR | Status: DC
  Administered 2023-07-10: 60 mg via INTRAVENOUS
  Filled 2023-07-09: qty 6

## 2023-07-09 MED ORDER — IPRATROPIUM-ALBUTEROL 0.5-2.5 (3) MG/3ML IN SOLN: 3.0000 mL | RESPIRATORY_TRACT | Status: DC | PRN

## 2023-07-09 NOTE — Progress Notes (Signed)
Pt placed on ATC 12L 40% by RT. Pt tolerating well at this time. Vitals stable, No increased WOB noted, spo2 100%. RN aware, CCM aware, RT will monitor as needed.      07/09/23 2440  Therapy Vitals  Pulse Rate 85  Resp (!) 28  MEWS Score/Color  MEWS Score 3  MEWS Score Color Yellow  Respiratory Assessment  Assessment Type Assess only  Respiratory Pattern Regular;Unlabored  Chest Assessment Chest expansion symmetrical  Cough Productive;Strong  Sputum Amount Copious  Sputum Color Tan;Yellow  Sputum Consistency Thick  Sputum Specimen Source Tracheostomy tube  Bilateral Breath Sounds Diminished;Rhonchi  Oxygen Therapy/Pulse Ox  O2 Device (S)  Tracheostomy Collar  O2 Therapy Oxygen humidified  O2 Flow Rate (L/min) 12 L/min  FiO2 (%) 40 %  SpO2 100 %  Tracheostomy Shiley XLT Proximal 8 mm Cuffed  Placement Date/Time: 07/05/23 1043   Placed By: (c) ICU physician  Brand: Shiley XLT Proximal  Size (mm): 8 mm  Style: Cuffed  Status Secured with trach ties and sutures  Site Assessment Clean;Dry  Ties Assessment Clean, Dry  Cuff Pressure (cm H2O) Clear OR 27-39 CmH2O  Tracheostomy Equipment at bedside Yes and checklist posted at head of bed

## 2023-07-09 NOTE — Progress Notes (Addendum)
Pt placed back on full vent support due to tachypnea, agitation and increased WOB. Pt tolerating well at this time. RN at bedside, CCM aware, RT will monitor as needed.    07/09/23 1117  Therapy Vitals  Pulse Rate (!) 107  Resp (!) 32  MEWS Score/Color  MEWS Score 5  MEWS Score Color Red  Respiratory Assessment  Assessment Type Assess only  Respiratory Pattern Regular;Unlabored  Chest Assessment Chest expansion symmetrical  Cough Productive  Sputum Amount Moderate  Sputum Color Tan  Sputum Consistency Thick  Bilateral Breath Sounds Diminished;Rhonchi  Oxygen Therapy/Pulse Ox  O2 Device (S)  Ventilator  O2 Therapy Oxygen  FiO2 (%) 60 %  SpO2 100 %  Tracheostomy Shiley XLT Proximal 8 mm Cuffed  Placement Date/Time: 07/05/23 1043   Placed By: (c) ICU physician  Brand: Shiley XLT Proximal  Size (mm): 8 mm  Style: Cuffed  Status Secured with trach ties and sutures  Site Assessment Clean;Dry  Site Care Cleansed;Dried;Dressing applied  Ties Assessment Clean, Dry  Cuff Pressure (cm H2O) Clear OR 27-39 CmH2O  Tracheostomy Equipment at bedside Yes and checklist posted at head of bed

## 2023-07-09 NOTE — Progress Notes (Signed)
Resp culture collected and sent to lab by RT. CCM aware, RT will monitor as needed.

## 2023-07-09 NOTE — Progress Notes (Signed)
Joshua Eaton is a 67 y/o gentleman with a history of pneumonia requiring MV and trach in Sept 2024 with prolonged respiratory failure. He was discharged to [facility] but self-discontinued his trach and had progressive respiratory failure within a few days, requiring readmission and repeat tracheostomy. Overnight no acute events. Today had agitation while on TCT requiring him to go back on the vent. Copious trach secretions.    BP (!) 157/58   Pulse 97   Temp 98.4 F (36.9 C) (Axillary)   Resp (!) 36   Ht 6' 0.99" (1.854 m)   Wt 96.4 kg   SpO2 97%   BMI 28.05 kg/m  Chronically ill appearing man lying in bed in NAD [place]/AT, eyes anicteric Breathing comfortably on MV via trach this morning. Scattered rhonchi, no wheezing S1S2, RRR Abd soft, NT No peripheral edema, minimal muscle mass.  Awake, alert, sometimes tracking but not answering questions or following commands. Spontaneously moving all extremities.  Skin warm, dry, no rashes.   BUN 56 Cr 0.87 WBC 10.1 H/H 8.5/26.4 Platelets 130  I/O + 2L yesterday  Assessment & plan: Acute on chronic respiratory failure requiring tracheostomy and MV Acute pulmonary edema, resolved Concern for recurrent pneumonia with increased secretions -trach aspirate culture -con't weaning efforts- only made it a few hours on TCT today -LTVV -VAP prevention protocol -PAD protocol - precedex + added fentanyl PRN today -routine trach care per protocol; can remove sutures on 07/12/23 -d/c pulmicort  Severe MR with leaflet perforation, had had preserved LV function Moderate AI -unfortunately due to severe illness, not a candidate for valve replacement -goal euvolemia; increase lasix today and restart daily dosing with positive fluid balance  Acute metabolic encephalopathy due to ICU delirium -seroqel -precedex PRN -ativan BID -fentanyl PRN for agitation and pain  Previous AKI- resolved -periodically check BMP -strict I/O  Urinary retention -needs to  keep foley unfortunately -can't take tamsulosin until able to take PO  Anemia due to critical illness Thrombocytopenia-- had been going on PTA -if thrombocytopenia does not improve, likely needs OP Hematology follow up  Paroxysmal Afib -amiodarone -apixaban -tele monitoring   Provoked RUE DVT due to PICC -eliquis  Hyperglycemia -SSI PRN -gaol BG 140-180  HTN -hydralazine q8h -want to avoid being too aggressive with need for sedation at times  Moderate protein energy malnutrition -TF  Wife updated at bedside. We discussed again his ongoing critical illness and the risk that he will never be able to fully recover before his valve disease progresses and he eventually has worse heart failure. Continuing all aggressive care efforts. Working on [employer] placement at [employer].   This patient is critically ill with multiple organ system failure which requires frequent high complexity decision making, assessment, support, evaluation, and titration of therapies. This was completed through the application of advanced monitoring technologies and extensive interpretation of multiple databases. During this encounter critical care time was devoted to patient care services described in this note for 36 minutes.  Steffanie Dunn, DO 07/09/23 1:33 PM [facility]  For contact information, see Amion. If no response to pager, please call PCCM consult pager. After hours, 7PM- 7AM, please call Elink.

## 2023-07-09 NOTE — Progress Notes (Addendum)
NAME:  Joshua Eaton, MRN:  [id number], DOB:  1955-11-14, LOS: 8 ADMISSION DATE:  07/01/2023, CONSULTATION DATE:  07/01/2023 REFERRING MD:  Particia Nearing, EDP CHIEF COMPLAINT:  Acute on chronic hypoxic respiratory failure   History of Present Illness:  Joshua Eaton is a 67 y/o gentleman with a history of respiratory failure and tracheostomy with recent admission from 10/3-10/28 at [facility] after a 2 week admission at [facility] in September for pneumonia complicated by ARDS and acute cardiogenic pulmonary edema. He has severe MR recently diagnosed and is not a candidate for valve replacement due to overall frailty. He underwent tracheostomy and PEG placement during his admission and was transferred to [facility] on 10/28. Later that day he had an accidental tracheostomy dislodgement, and his trach stayed out. Yesterday he developed increased work of breathing and was transferred to the ED for further evaluation. He was trialed on BiPAP but did not tolerate it well and eventually required reintubation in the ED. He underwent CT scan demonstrating possible contained bowel perforation; Ct with enteral contrast has now been ordered and surgery has evaluated him. PCCM was consulted for admission.   Pertinent  Medical History  HTN Respiratory failure requiring trach Afib RUE DVT- provoked by PICC  Significant Hospital Events: Including procedures, antibiotic start and stop dates in addition to other pertinent events   10/3 initially admitted 10/6 trach 10/9 TEE with severe MR with mitral valve perforation 10/11 cardiothoracic surgery, not a surgical candidate  10/18 trach revision after cuff leak> required reintubation to stabilize 10/24 PEG placement 10/28 discharged to [facility], dislodged trach, which was then left out. 10/31 readmitted for respiratory failure,possible contained bowel perforation 10/31 CT chest/abdomen/pelvis shows diffuse airspace disease 11/1 CT abd/pelvis w/ enteral contrast negative for  gastric or enteric leak - the contrast is visible in his stomach, small bowel, and colon to the level of his rectum without extravasation.  11/4 tracheostomy revision 11/6 successful SBT, on trach collar 11/7 back on ventilator after tachypnea   Interim History / Subjective:  Awake but not following commands.  More thick excretions on trach collar, placed back on vent, sent sample for culture.  Objective   Blood pressure (!) 116/54, pulse 74, temperature 97.7 F (36.5 C), temperature source Oral, resp. rate (!) 21, height 6' 0.99" (1.854 m), weight 96.4 kg, SpO2 100%.    Vent Mode: PRVC FiO2 (%):  [40 %-100 %] 60 % Set Rate:  [20 bmp] 20 bmp Vt Set:  [470 mL] 470 mL PEEP:  [5 cmH20] 5 cmH20 Plateau Pressure:  [17 cmH20-18 cmH20] 17 cmH20   Intake/Output Summary (Last 24 hours) at 07/09/2023 0622 Last data filed at 07/09/2023 0600 Gross per 24 hour  Intake 4062.21 ml  Output 1550 ml  Net 2512.21 ml   Filed Weights   07/06/23 0500 07/07/23 0500 07/09/23 0430  Weight: 92.1 kg 95.6 kg 96.4 kg    Examination: General: thin adult male in NAD HENT: [place]/AT, PERRL.  Lungs: Bilateral ventilated breath sounds, no accessory muscle use Cardiovascular: RRR, 4/6 murmur Abdomen: Soft, NT, ND Extremities: No acute deformity or edema.  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  -  Per cardiology note from 10/11, family did not want [place] cardiology involved in his care - his overall status would need to improve to be considered for valve surgery - continue lasix increased to 60 mg daily   Anemia due to critical illness , 8.5 today -transfuse for Hb <8 or hemodynamically significant bleeding -monitor  Hypertension Bp up when off sedation.  -hydralazine 10 mg tid  RUE DVT provoked by PICC -needs 3 months for provoked clot -eliquis    Afib, paroxymsal -amiodarone continue -eliquis   Thrombocytopenia -Monitor   Hyperglycemia -SSI PRN -goal BG 140-180   ICU delirium EEG negative for seizure -Seroquel 100 mg bid - home sertraline - increase ativan - precedex    Deconditioning, debility Severe protein energy malnutrition - enteral TF    BPH -unable to take tamsulosin until able to take pills -Foley   Perineal moisture associated skin breakdown -Foley, flexiseal  Best Practice (right click and "Reselect all SmartList Selections" daily)   Diet/type: tubefeeds DVT prophylaxis: DOAC GI prophylaxis: PPI Lines: N/A Foley:  Yes, and it is still needed Code Status:  full code Last date of multidisciplinary goals of care discussion [07/09/2023]  Labs   CBC: Recent Labs  Lab 07/04/23 0534 07/04/23 1144 07/05/23 0710 07/06/23 0339 07/07/23 0303 07/07/23 2125 07/08/23 0318  WBC 9.6 10.1 9.6 7.1 9.3  --  10.8*  NEUTROABS 6.2  --  5.9  --   --   --   --   HGB 7.1* 7.1* 7.6* 7.3* 7.0* 8.4* 8.1*  HCT 23.0* 22.5* 24.4* 23.8* 23.1* 26.8* 25.9*  MCV 104.5* 105.6* 105.6* 106.3* 104.5*  --  100.4*  PLT 121* 126* 137* 117* 128*  --  124*    Basic Metabolic Panel: Recent Labs  Lab 07/05/23 0710 07/06/23 0339 07/06/23 0340 07/07/23 0303 07/08/23 0318 07/09/23 0257  NA 146* 144  --  146* 145 144  K 4.3 3.4*  --  4.1 3.8 4.6  CL 109 107  --  109 106 104  CO2 27 32  --  32 32 31  GLUCOSE 125* 162*  --  133* 134* 140*  BUN 73* 60*  --  43*  46* 56*  CREATININE 1.19 1.04  --  0.89 0.88 0.87  CALCIUM 8.9 8.8*  --  8.6* 8.3* 7.5*  MG 3.1*  --  2.5* 2.6* 2.5* 2.4  PHOS 3.2 3.0 3.0 2.7 1.9* 3.1   GFR: Estimated Creatinine Clearance: 98 mL/min (by C-G formula based on SCr of 0.87 mg/dL). Recent Labs  Lab 07/05/23 0710 07/06/23 0339 07/07/23 0303 07/08/23 0318  WBC 9.6 7.1 9.3 10.8*    Liver Function Tests: Recent Labs  Lab 07/06/23 0339 07/07/23 0303 07/08/23 0318 07/09/23 0257  ALBUMIN 1.9* 1.9* 1.8* 1.6*   No results for input(s): "LIPASE", "AMYLASE" in the last 168 hours.  No results for input(s): "AMMONIA" in the last 168 hours.  ABG    Component Value Date/Time   PHART 7.36 07/02/2023 0145   PCO2ART 46 07/02/2023 0145   PO2ART 120 (H) 07/02/2023 0145   HCO3 26.6 07/02/2023 0145   TCO2 27 07/01/2023 2203   ACIDBASEDEF 2.0 07/01/2023 2203   O2SAT 99.5 07/02/2023 0145     Coagulation Profile: No results for input(s): "INR", "PROTIME" in the last 168 hours.  Cardiac Enzymes: No results for input(s): "CKTOTAL", "CKMB", "CKMBINDEX", "TROPONINI" in the last 168 hours.  HbA1C: Hgb A1c MFr Bld  Date/Time Value Ref Range Status  06/30/2023 04:58 AM 4.1 (L) 4.8 - 5.6 % Final    Comment:    (NOTE) Pre diabetes:          5.7%-6.4%  Diabetes:              >6.4%  Glycemic control for   <7.0% adults with diabetes   12/09/2015 06:30 PM 5.1 4.8 - 5.6 % Final    Comment:    (NOTE)         Pre-diabetes: 5.7 - 6.4         Diabetes: >6.4         Glycemic control for adults with diabetes: <7.0     CBG: Recent Labs  Lab 07/08/23 1117 07/08/23 1531 07/08/23 1925 07/08/23 2317 07/09/23 0327  GLUCAP 154* 162* 177* 105* 158*    Review of Systems:   Unable to obtain   Past Medical History:  He,  has a past medical history of Anxiety, Cancer (HCC), Depression, Hypertension, and OCD (obsessive compulsive disorder).   Surgical History:   Past Surgical History:  Procedure Laterality Date    CHOLECYSTECTOMY     COLON SURGERY     cancerous polyp removed   HERNIA REPAIR     IR GASTROSTOMY TUBE MOD SED  06/24/2023     Social History:   reports that he has never smoked. He has never used smokeless tobacco. He reports that he does not drink alcohol and does not use drugs.   Family History:  His family history is not on file.   Allergies Allergies  Allergen Reactions   Codeine     "sends me into orbit"     Home Medications  Prior to Admission medications   Medication Sig Start Date End Date Taking? Authorizing Provider  acetaminophen (TYLENOL) 325 MG tablet Take 650 mg by mouth every 6 (six) hours as needed for moderate pain.   Yes [provider]  ACIDOPHILUS LACTOBACILLUS PO Place 1 capsule into feeding tube daily.   Yes [provider]  amiodarone (PACERONE) 200 MG tablet Place 1 tablet (200 mg total) into feeding tube 2 (two) times daily for 4 days, THEN 1 tablet (200 mg total) daily. 06/28/23 08/01/23 Yes Rhetta Mura, MD  apixaban (ELIQUIS) 5 MG TABS tablet Place 1 tablet (5 mg total) into feeding tube 2 (two) times daily. 06/28/23  Yes Rhetta Mura, MD  aspirin 81 MG chewable tablet Place 81 mg into feeding tube daily.   Yes [provider]  ceFEPIme 2 g in sodium chloride 0.9 % 100 mL Inject 2 g into the vein every 8 (eight) hours.   Yes [provider]  dextrose (GLUTOSE) 40 % GEL Take 15 g by mouth daily as needed for low blood sugar (70mg /dL).   Yes [provider]  Electrolyte-A in Dextrose (DEXTROSE 50%/ELECTROLYTES IV) Inject 25 g into the vein daily as needed (blood sugar 50mg /dL).   Yes [provider]  feeding supplement (OSMOLITE 1 CAL) LIQD Take 1,000 mLs by mouth daily. 65 mL/hr 06/28/23  Yes Rhetta Mura, MD  furosemide (LASIX) 10 MG/ML injection Inject 10 mg into the muscle once.   Yes [provider]  Glucagon HCl 1 MG SOLR Inject 1 mg as directed once as needed  (hypoglycemia).   Yes [provider]  hydrALAZINE (APRESOLINE) 10 MG tablet Place  10 mg into feeding tube every 8 (eight) hours.   Yes [provider]  HYDROcodone-acetaminophen (NORCO/VICODIN) 5-325 MG tablet Take 1 tablet by mouth every 6 (six) hours as needed for moderate pain (pain score 4-6).   Yes [provider]  insulin lispro (HUMALOG) 100 UNIT/ML injection Inject 0-6 Units into the skin 3 (three) times daily before meals. Per Sliding Scale: Blood sugar very low dose <70 initiate hypoglycemic treatment 70-200: No insulin 201-250: 2 units 251-300: 3 units 301-350: 4 units 351-400: 5 units >400: 6 units call medical provider   Yes [provider]  ipratropium-albuterol (DUONEB) 0.5-2.5 (3) MG/3ML SOLN Take 3 mLs by nebulization every 4 (four) hours as needed (sob/wheezing).   Yes [provider]  loperamide (IMODIUM) 2 MG capsule Take 2 mg by mouth in the morning, at noon, and at bedtime.   Yes [provider]  LORazepam (ATIVAN) 0.5 MG tablet Take 1 tablet (0.5 mg total) by mouth in the morning. Patient taking differently: Take 0.5 mg by mouth every 8 (eight) hours as needed for anxiety. 06/28/23  Yes Rhetta Mura, MD  magnesium oxide (MAG-OX) 400 (240 Mg) MG tablet Take 400 mg by mouth every 6 (six) hours as needed (magnesium electrolyte replacement).   Yes [provider]  methylPREDNISolone sodium succinate (SOLU-MEDROL) 40 mg/mL injection Inject 80 mg into the vein every 8 (eight) hours.   Yes [provider]  metoprolol tartrate (LOPRESSOR) 25 MG tablet Place 0.5 tablets (12.5 mg total) into feeding tube 2 (two) times daily. 06/28/23  Yes Rhetta Mura, MD  metroNIDAZOLE (FLAGYL) 500 MG/100ML Inject 500 mg into the vein every 8 (eight) hours.   Yes [provider]  Morphine Sulfate, PF, 2 MG/ML SOLN Inject 2 mg as directed once as needed (pain.).   Yes [provider]   pantoprazole (PROTONIX) 2 mg/mL suspension Place 40 mg into feeding tube daily.   Yes [provider]  potassium chloride SA (KLOR-CON M) 20 MEQ tablet Take 40 mEq by mouth 2 (two) times daily. Or every 4 hours as needed for potassium electrolyte replacement.   Yes [provider]  QUEtiapine (SEROQUEL) 25 MG tablet Place 1 tablet (25 mg total) into feeding tube 2 (two) times daily. 06/28/23  Yes Rhetta Mura, MD  simethicone (MYLICON) 80 MG chewable tablet Place 80 mg into feeding tube 4 (four) times daily.   Yes [provider]  metroNIDAZOLE (FLAGYL) 500 MG tablet Place 500 mg into feeding tube every 8 (eight) hours. Patient not taking: Reported on 07/02/2023    [provider]  Water For Irrigation, Sterile (FREE WATER) SOLN Place 200 mLs into feeding tube every 6 (six) hours. 06/28/23   Rhetta Mura, MD     Critical care time: 38    Rocky Morel, DO Internal Medicine Resident, PGY-2 Pager# [phone]  Pulmonary Critical Care 07/09/2023 6:22 AM  For contact information, see Amion. If no response to pager, please call PCCM consult pager. After hours, 7PM- 7AM, please call Elink.

## 2023-07-10 DIAGNOSIS — Z930 Tracheostomy status: Secondary | ICD-10-CM | POA: Diagnosis not present

## 2023-07-10 DIAGNOSIS — Z9911 Dependence on respirator [ventilator] status: Secondary | ICD-10-CM | POA: Diagnosis not present

## 2023-07-10 DIAGNOSIS — J15212 Pneumonia due to Methicillin resistant Staphylococcus aureus: Secondary | ICD-10-CM | POA: Diagnosis not present

## 2023-07-10 DIAGNOSIS — G9341 Metabolic encephalopathy: Secondary | ICD-10-CM

## 2023-07-10 DIAGNOSIS — J1520 Pneumonia due to staphylococcus, unspecified: Secondary | ICD-10-CM

## 2023-07-10 LAB — GLUCOSE, CAPILLARY
Glucose-Capillary: 202 mg/dL — ABNORMAL HIGH (ref 70–99)
Glucose-Capillary: 167 mg/dL — ABNORMAL HIGH (ref 70–99)
Glucose-Capillary: 137 mg/dL — ABNORMAL HIGH (ref 70–99)
Glucose-Capillary: 189 mg/dL — ABNORMAL HIGH (ref 70–99)
Glucose-Capillary: 165 mg/dL — ABNORMAL HIGH (ref 70–99)
Glucose-Capillary: 168 mg/dL — ABNORMAL HIGH (ref 70–99)

## 2023-07-10 LAB — MRSA NEXT GEN BY PCR, NASAL: MRSA by PCR Next Gen: NOT DETECTED

## 2023-07-10 MED ORDER — VANCOMYCIN HCL 2000 MG/400ML IV SOLN
2000.0000 mg | Freq: Once | INTRAVENOUS | Status: AC
  Administered 2023-07-10: 2000 mg via INTRAVENOUS
  Filled 2023-07-10: qty 400

## 2023-07-10 MED ORDER — SODIUM CHLORIDE 0.9 % IV SOLN
4.5000 g | Freq: Once | INTRAVENOUS | Status: AC
  Administered 2023-07-10: 4.5 g via INTRAVENOUS
  Filled 2023-07-10: qty 20

## 2023-07-10 MED ORDER — PIPERACILLIN-TAZOBACTAM 4.5 G IVPB
4.5000 g | Freq: Once | INTRAVENOUS | Status: DC
  Filled 2023-07-10: qty 100

## 2023-07-10 MED ORDER — VANCOMYCIN HCL 1250 MG/250ML IV SOLN
1250.0000 mg | Freq: Two times a day (BID) | INTRAVENOUS | Status: DC
  Administered 2023-07-10 – 2023-07-12 (×4): 1250 mg via INTRAVENOUS
  Filled 2023-07-10 (×5): qty 250

## 2023-07-10 MED ORDER — FUROSEMIDE 10 MG/ML IJ SOLN
60.0000 mg | Freq: Two times a day (BID) | INTRAMUSCULAR | Status: AC
  Administered 2023-07-10 – 2023-07-12 (×4): 60 mg via INTRAVENOUS
  Filled 2023-07-10 (×4): qty 6

## 2023-07-10 MED ORDER — PIPERACILLIN-TAZOBACTAM 3.375 G IVPB
3.3750 g | Freq: Three times a day (TID) | INTRAVENOUS | Status: DC
  Administered 2023-07-10 – 2023-07-11 (×3): 3.375 g via INTRAVENOUS
  Filled 2023-07-10 (×3): qty 50

## 2023-07-10 MED ORDER — LORAZEPAM 0.5 MG PO TABS
0.5000 mg | ORAL_TABLET | Freq: Two times a day (BID) | ORAL | Status: DC
  Administered 2023-07-10 – 2023-07-12 (×4): 0.5 mg
  Filled 2023-07-10 (×4): qty 1

## 2023-07-10 NOTE — Progress Notes (Signed)
NAME:  Joshua Eaton, MRN:  [id number], DOB:  1956/01/05, LOS: 9 ADMISSION DATE:  07/01/2023, CONSULTATION DATE:  07/01/2023 REFERRING MD:  Particia Nearing, EDP CHIEF COMPLAINT:  Acute on chronic hypoxic respiratory failure   History of Present Illness:  Joshua Eaton is a 67 y/o gentleman with a history of respiratory failure and tracheostomy with recent admission from 10/3-10/28 at [facility] after a 2 week admission at [facility] in September for pneumonia complicated by ARDS and acute cardiogenic pulmonary edema. He has severe MR recently diagnosed and is not a candidate for valve replacement due to overall frailty. He underwent tracheostomy and PEG placement during his admission and was transferred to [facility] on 10/28. Later that day he had an accidental tracheostomy dislodgement, and his trach stayed out. Yesterday he developed increased work of breathing and was transferred to the ED for further evaluation. He was trialed on BiPAP but did not tolerate it well and eventually required reintubation in the ED. He underwent CT scan demonstrating possible contained bowel perforation; Ct with enteral contrast has now been ordered and surgery has evaluated him. PCCM was consulted for admission.   Pertinent  Medical History  HTN Respiratory failure requiring trach Afib RUE DVT- provoked by PICC  Significant Hospital Events: Including procedures, antibiotic start and stop dates in addition to other pertinent events   10/3 initially admitted 10/6 trach 10/9 TEE with severe MR with mitral valve perforation 10/11 cardiothoracic surgery, not a surgical candidate  10/18 trach revision after cuff leak> required reintubation to stabilize 10/24 PEG placement 10/28 discharged to [facility], dislodged trach, which was then left out. 10/31 readmitted for respiratory failure,possible contained bowel perforation 10/31 CT chest/abdomen/pelvis shows diffuse airspace disease 11/1 CT abd/pelvis w/ enteral contrast negative for  gastric or enteric leak - the contrast is visible in his stomach, small bowel, and colon to the level of his rectum without extravasation.  11/4 tracheostomy revision 11/6 successful SBT, on trach collar 11/7 back on ventilator after tachypnea  11/8 continues to get tachypneic on trach collar and having copious thick excretions  Interim History / Subjective:  Awake but not following commands.   Objective   Blood pressure (!) 118/52, pulse 73, temperature 99.1 F (37.3 C), temperature source Axillary, resp. rate 20, height 6' 0.99" (1.854 m), weight 96.7 kg, SpO2 100%.    Vent Mode: PRVC FiO2 (%):  [40 %-60 %] 60 % Set Rate:  [20 bmp] 20 bmp Vt Set:  [470 mL] 470 mL PEEP:  [5 cmH20] 5 cmH20 Pressure Support:  [8 cmH20] 8 cmH20 Plateau Pressure:  [18 cmH20] 18 cmH20   Intake/Output Summary (Last 24 hours) at 07/10/2023 0653 Last data filed at 07/10/2023 0600 Gross per 24 hour  Intake 2201.01 ml  Output 1600 ml  Net 601.01 ml   Filed Weights   07/07/23 0500 07/09/23 0430 07/10/23 0347  Weight: 95.6 kg 96.4 kg 96.7 kg    Examination: General: thin adult male in NAD HENT: [place]/AT, PERRL.  Lungs: Bilateral ventilated breath sounds, no accessory muscle use Cardiovascular: RRR, 4/6 murmur Abdomen: Soft, NT, ND Extremities: 1+ pitting edema in the lower extremities bilaterally Neuro:awake but not following commands  Resolved Hospital Problem list     Assessment & Plan:  Acute respiratory failure with hypoxia & hypercapnia ARDS- unclear if primarily from pneumonia vs edema from mitral regurgitation Recent tracheostomy x2 , [facility]ged at [facility] Concern for VAP -Trach redone 11/4 - ventilator, PAD protocol -Completed Zosyn 7 day course 11/6 - continue daily wean  to trach collar, attempting to improve volume status with increased lasix, +3 L over the past 48 hours - sent 11/8 sputum for culture - start vancomycin and zosyn for VAP   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  - Per cardiology note from 10/11, family did not want [place] cardiology involved in his care - his overall status would need to improve to be considered for valve surgery - Lasix 60 mg IV daily, may increase to BID this afternoon   Anemia due to critical illness  -transfuse for Hb <8 or hemodynamically significant bleeding -monitor  Hypertension Bp up when off sedation.  -hydralazine 10 mg tid  RUE DVT provoked by PICC -needs 3 months for provoked clot -eliquis    Afib, paroxymsal -amiodarone continue -eliquis   Thrombocytopenia -Monitor   Hyperglycemia -SSI PRN -goal BG 140-180   ICU delirium EEG negative for seizure -Seroquel 100 mg bid - home sertraline - increase ativan - precedex    Deconditioning, debility Severe protein energy malnutrition - enteral TF    BPH -unable to take tamsulosin until able to take pills -Foley   Perineal moisture associated skin breakdown -Foley, flexiseal  Best Practice (right click and "Reselect all SmartList Selections" daily)   Diet/type: tubefeeds DVT prophylaxis: DOAC GI prophylaxis: PPI Lines: N/A Foley:  Yes, and it is still needed Code Status:  full code Last date of multidisciplinary goals of care discussion [07/10/2023]  Labs   CBC: Recent Labs  Lab 07/04/23 0534 07/04/23 1144 07/05/23 0710 07/06/23 0339 07/07/23 0303 07/07/23 2125 07/08/23 0318 07/09/23 0822  WBC 9.6   < > 9.6 7.1 9.3  --  10.8* 10.1  NEUTROABS 6.2  --  5.9  --   --   --   --   --   HGB 7.1*   < > 7.6* 7.3* 7.0* 8.4* 8.1* 8.5*  HCT 23.0*   < > 24.4* 23.8* 23.1* 26.8* 25.9* 26.4*  MCV 104.5*   < > 105.6* 106.3* 104.5*  --  100.4* 100.0  PLT 121*   < > 137* 117* 128*  --  124* 130*   < > = values in this interval not displayed.    Basic  Metabolic Panel: Recent Labs  Lab 07/05/23 0710 07/06/23 0339 07/06/23 0340 07/07/23 0303 07/08/23 0318 07/09/23 0257  NA 146* 144  --  146* 145 144  K 4.3 3.4*  --  4.1 3.8 4.6  CL 109 107  --  109 106 104  CO2 27 32  --  32 32 31  GLUCOSE 125* 162*  --  133* 134* 140*  BUN 73* 60*  --  43* 46* 56*  CREATININE 1.19 1.04  --  0.89 0.88 0.87  CALCIUM 8.9 8.8*  --  8.6* 8.3* 7.5*  MG 3.1*  --  2.5* 2.6* 2.5* 2.4  PHOS 3.2 3.0 3.0 2.7 1.9* 3.1   GFR: Estimated Creatinine Clearance: 98.2 mL/min (by C-G formula based on SCr of 0.87 mg/dL). Recent Labs  Lab 07/06/23 0339 07/07/23 0303 07/08/23 0318 07/09/23 0822  WBC 7.1 9.3 10.8* 10.1    Liver Function Tests: Recent Labs  Lab 07/06/23 0339 07/07/23 0303 07/08/23 0318 07/09/23 0257  ALBUMIN 1.9* 1.9* 1.8* 1.6*   No results for input(s): "LIPASE", "AMYLASE" in the last 168 hours.  No results for input(s): "AMMONIA" in the  last 168 hours.  ABG    Component Value Date/Time   PHART 7.36 07/02/2023 0145   PCO2ART 46 07/02/2023 0145   PO2ART 120 (H) 07/02/2023 0145   HCO3 26.6 07/02/2023 0145   TCO2 27 07/01/2023 2203   ACIDBASEDEF 2.0 07/01/2023 2203   O2SAT 99.5 07/02/2023 0145     Coagulation Profile: No results for input(s): "INR", "PROTIME" in the last 168 hours.   Cardiac Enzymes: No results for input(s): "CKTOTAL", "CKMB", "CKMBINDEX", "TROPONINI" in the last 168 hours.  HbA1C: Hgb A1c MFr Bld  Date/Time Value Ref Range Status  06/30/2023 04:58 AM 4.1 (L) 4.8 - 5.6 % Final    Comment:    (NOTE) Pre diabetes:          5.7%-6.4%  Diabetes:              >6.4%  Glycemic control for   <7.0% adults with diabetes   12/09/2015 06:30 PM 5.1 4.8 - 5.6 % Final    Comment:    (NOTE)         Pre-diabetes: 5.7 - 6.4         Diabetes: >6.4         Glycemic control for adults with diabetes: <7.0     CBG: Recent Labs  Lab 07/09/23 1145 07/09/23 1517 07/09/23 1918 07/09/23 2301 07/10/23 0334   GLUCAP 121* 158* 176* 107* 202*    Review of Systems:   Unable to obtain   Past Medical History:  He,  has a past medical history of Anxiety, Cancer (HCC), Depression, Hypertension, and OCD (obsessive compulsive disorder).   Surgical History:   Past Surgical History:  Procedure Laterality Date   CHOLECYSTECTOMY     COLON SURGERY     cancerous polyp removed   HERNIA REPAIR     IR GASTROSTOMY TUBE MOD SED  06/24/2023     Social History:   reports that he has never smoked. He has never used smokeless tobacco. He reports that he does not drink alcohol and does not use drugs.   Family History:  His family history is not on file.   Allergies Allergies  Allergen Reactions   Codeine     "sends me into orbit"     Home Medications  Prior to Admission medications   Medication Sig Start Date End Date Taking? Authorizing Provider  acetaminophen (TYLENOL) 325 MG tablet Take 650 mg by mouth every 6 (six) hours as needed for moderate pain.   Yes [provider]  ACIDOPHILUS LACTOBACILLUS PO Place 1 capsule into feeding tube daily.   Yes [provider]  amiodarone (PACERONE) 200 MG tablet Place 1 tablet (200 mg total) into feeding tube 2 (two) times daily for 4 days, THEN 1 tablet (200 mg total) daily. 06/28/23 08/01/23 Yes Rhetta Mura, MD  apixaban (ELIQUIS) 5 MG TABS tablet Place 1 tablet (5 mg total) into feeding tube 2 (two) times daily. 06/28/23  Yes Rhetta Mura, MD  aspirin 81 MG chewable tablet Place 81 mg into feeding tube daily.   Yes [provider]  ceFEPIme 2 g in sodium chloride 0.9 % 100 mL Inject 2 g into the vein every 8 (eight) hours.   Yes [provider]  dextrose (GLUTOSE) 40 % GEL Take 15 g by mouth daily as needed for low blood sugar (70mg /dL).   Yes [provider]  Electrolyte-A in Dextrose (DEXTROSE 50%/ELECTROLYTES IV) Inject 25 g into the vein daily as needed (blood sugar 50mg /dL).  Yes [provider]  feeding supplement (OSMOLITE 1 CAL) LIQD Take 1,000 mLs by mouth daily. 65 mL/hr 06/28/23  Yes Rhetta Mura, MD  furosemide (LASIX) 10 MG/ML injection Inject 10 mg into the muscle once.   Yes [provider]  Glucagon HCl 1 MG SOLR Inject 1 mg as directed once as needed (hypoglycemia).   Yes [provider]  hydrALAZINE (APRESOLINE) 10 MG tablet Place 10 mg into feeding tube every 8 (eight) hours.   Yes [provider]  HYDROcodone-acetaminophen (NORCO/VICODIN) 5-325 MG tablet Take 1 tablet by mouth every 6 (six) hours as needed for moderate pain (pain score 4-6).   Yes [provider]  insulin lispro (HUMALOG) 100 UNIT/ML injection Inject 0-6 Units into the skin 3 (three) times daily before meals. Per Sliding Scale: Blood sugar very low dose <70 initiate hypoglycemic treatment 70-200: No insulin 201-250: 2 units 251-300: 3 units 301-350: 4 units 351-400: 5 units >400: 6 units call medical provider   Yes [provider]  ipratropium-albuterol (DUONEB) 0.5-2.5 (3) MG/3ML SOLN Take 3 mLs by nebulization every 4 (four) hours as needed (sob/wheezing).   Yes [provider]  loperamide (IMODIUM) 2 MG capsule Take 2 mg by mouth in the morning, at noon, and at bedtime.   Yes [provider]  LORazepam (ATIVAN) 0.5 MG tablet Take 1 tablet (0.5 mg total) by mouth in the morning. Patient taking differently: Take 0.5 mg by mouth every 8 (eight) hours as needed for anxiety. 06/28/23  Yes Rhetta Mura, MD  magnesium oxide (MAG-OX) 400 (240 Mg) MG tablet Take 400 mg by mouth every 6 (six) hours as needed (magnesium electrolyte replacement).   Yes [provider]  methylPREDNISolone sodium succinate (SOLU-MEDROL) 40 mg/mL injection Inject 80 mg into the vein every 8 (eight) hours.   Yes [provider]  metoprolol tartrate (LOPRESSOR) 25 MG tablet Place 0.5 tablets (12.5 mg total) into feeding tube 2  (two) times daily. 06/28/23  Yes Rhetta Mura, MD  metroNIDAZOLE (FLAGYL) 500 MG/100ML Inject 500 mg into the vein every 8 (eight) hours.   Yes [provider]  Morphine Sulfate, PF, 2 MG/ML SOLN Inject 2 mg as directed once as needed (pain.).   Yes [provider]  pantoprazole (PROTONIX) 2 mg/mL suspension Place 40 mg into feeding tube daily.   Yes [provider]  potassium chloride SA (KLOR-CON M) 20 MEQ tablet Take 40 mEq by mouth 2 (two) times daily. Or every 4 hours as needed for potassium electrolyte replacement.   Yes [provider]  QUEtiapine (SEROQUEL) 25 MG tablet Place 1 tablet (25 mg total) into feeding tube 2 (two) times daily. 06/28/23  Yes Rhetta Mura, MD  simethicone (MYLICON) 80 MG chewable tablet Place 80 mg into feeding tube 4 (four) times daily.   Yes [provider]  metroNIDAZOLE (FLAGYL) 500 MG tablet Place 500 mg into feeding tube every 8 (eight) hours. Patient not taking: Reported on 07/02/2023    [provider]  Water For Irrigation, Sterile (FREE WATER) SOLN Place 200 mLs into feeding tube every 6 (six) hours. 06/28/23   Rhetta Mura, MD     Critical care time: 60    Rocky Morel, DO Internal Medicine Resident, PGY-2 Pager# [phone] [facility] 07/10/2023 6:53 AM  For contact information, see Amion. If no response to pager, please call PCCM consult pager. After hours, 7PM- 7AM, please call Elink.

## 2023-07-10 NOTE — Plan of Care (Signed)
  Problem: Respiratory: Goal: Ability to maintain a clear airway and adequate ventilation will improve Outcome: Progressing   Problem: Nutritional: Goal: Maintenance of adequate nutrition will improve Outcome: Progressing   Problem: Skin Integrity: Goal: Risk for impaired skin integrity will decrease Outcome: Progressing   Problem: Tissue Perfusion: Goal: Adequacy of tissue perfusion will improve Outcome: Progressing

## 2023-07-10 NOTE — Progress Notes (Signed)
Pharmacy Antibiotic Note  Joshua Eaton is a 67 y.o. male admitted on 07/01/2023 with pneumonia (VAP).  Pharmacy has been consulted for Vancomycin and Zosyn dosing. Recently completed 7 day course of Zosyn (11/6).  Febrile this AM to 101 F, with increasing RR (37). Labs pending from this AM. Serum creatinine has improved throughout admission (0.87 from 1.54 on 11/2).   Plan: Vancomycin 2000 mg x1 followed by 1250 mg q12h for AUC of 479  Zosyn 4.5 g x1 followed by 3.375 g over 4 hours q8h.   Height: 6' 0.99" (185.4 cm) Weight: 96.7 kg (213 lb 3 oz) IBW/kg (Calculated) : 79.88  Temp (24hrs), Avg:99.6 F (37.6 C), Min:98.4 F (36.9 C), Max:101 F (38.3 C)  Recent Labs  Lab 07/05/23 0710 07/06/23 0339 07/07/23 0303 07/08/23 0318 07/09/23 0257 07/09/23 0822  WBC 9.6 7.1 9.3 10.8*  --  10.1  CREATININE 1.19 1.04 0.89 0.88 0.87  --     Estimated Creatinine Clearance: 98.2 mL/min (by C-G formula based on SCr of 0.87 mg/dL).    Allergies  Allergen Reactions   Codeine     "sends me into orbit"    Antimicrobials this admission: Zosyn 10/31>11/6 Vanc 11/2  Dose adjustments this admission: N/a   Microbiology results: 10/31 BCX: ng 11/1 BCX: ng 11/1 UCX: ng 11/2, 11/9 MRSA PCR negative 11/8 resp / TA: pending     Thank you for allowing pharmacy to be a part of this patient's care.  Cedric Fishman 07/10/2023 10:15 AM

## 2023-07-11 DIAGNOSIS — Z9911 Dependence on respirator [ventilator] status: Secondary | ICD-10-CM | POA: Diagnosis not present

## 2023-07-11 DIAGNOSIS — J15212 Pneumonia due to Methicillin resistant Staphylococcus aureus: Secondary | ICD-10-CM | POA: Diagnosis not present

## 2023-07-11 DIAGNOSIS — G9341 Metabolic encephalopathy: Secondary | ICD-10-CM | POA: Diagnosis not present

## 2023-07-11 DIAGNOSIS — Z930 Tracheostomy status: Secondary | ICD-10-CM | POA: Diagnosis not present

## 2023-07-11 LAB — CBC
HCT: 26.8 % — ABNORMAL LOW (ref 39.0–52.0)
Hemoglobin: 8.1 g/dL — ABNORMAL LOW (ref 13.0–17.0)
MCH: 31 pg (ref 26.0–34.0)
MCHC: 30.2 g/dL (ref 30.0–36.0)
MCV: 102.7 fL — ABNORMAL HIGH (ref 80.0–100.0)
Platelets: 134 10*3/uL — ABNORMAL LOW (ref 150–400)
RBC: 2.61 MIL/uL — ABNORMAL LOW (ref 4.22–5.81)
RDW: 16.6 % — ABNORMAL HIGH (ref 11.5–15.5)
WBC: 8.4 10*3/uL (ref 4.0–10.5)
nRBC: 0 % (ref 0.0–0.2)

## 2023-07-11 LAB — BASIC METABOLIC PANEL
Anion gap: 11 (ref 5–15)
BUN: 46 mg/dL — ABNORMAL HIGH (ref 8–23)
CO2: 30 mmol/L (ref 22–32)
Calcium: 8.1 mg/dL — ABNORMAL LOW (ref 8.9–10.3)
Chloride: 105 mmol/L (ref 98–111)
Creatinine, Ser: 0.79 mg/dL (ref 0.61–1.24)
GFR, Estimated: >60 mL/min (ref >60)
Glucose, Bld: 166 mg/dL — ABNORMAL HIGH (ref 70–99)
Potassium: 2.9 mmol/L — ABNORMAL LOW (ref 3.5–5.1)
Sodium: 146 mmol/L — ABNORMAL HIGH (ref 135–145)

## 2023-07-11 LAB — CULTURE, RESPIRATORY W GRAM STAIN

## 2023-07-11 LAB — GLUCOSE, CAPILLARY
Glucose-Capillary: 147 mg/dL — ABNORMAL HIGH (ref 70–99)
Glucose-Capillary: 141 mg/dL — ABNORMAL HIGH (ref 70–99)
Glucose-Capillary: 155 mg/dL — ABNORMAL HIGH (ref 70–99)
Glucose-Capillary: 159 mg/dL — ABNORMAL HIGH (ref 70–99)
Glucose-Capillary: 189 mg/dL — ABNORMAL HIGH (ref 70–99)
Glucose-Capillary: 132 mg/dL — ABNORMAL HIGH (ref 70–99)

## 2023-07-11 MED ORDER — POTASSIUM CHLORIDE 10 MEQ/100ML IV SOLN
10.0000 meq | INTRAVENOUS | Status: AC
Start: 2023-07-11 — End: 2023-07-11
  Administered 2023-07-11 (×3): 10 meq via INTRAVENOUS
  Filled 2023-07-11 (×4): qty 100

## 2023-07-11 MED ORDER — CARMEX CLASSIC LIP BALM EX OINT
TOPICAL_OINTMENT | CUTANEOUS | Status: DC | PRN
  Filled 2023-07-11: qty 10

## 2023-07-11 MED ORDER — POTASSIUM CHLORIDE 20 MEQ PO PACK
40.0000 meq | PACK | Freq: Once | ORAL | Status: AC
Start: 2023-07-11 — End: 2023-07-11
  Administered 2023-07-11: 40 meq
  Filled 2023-07-11: qty 2

## 2023-07-11 NOTE — Progress Notes (Signed)
[facility] ADULT ICU REPLACEMENT PROTOCOL   The patient does apply for the [facility] Adult ICU Electrolyte Replacment Protocol based on the criteria listed below:   1.Exclusion criteria: TCTS, ECMO, Dialysis, and Myasthenia Gravis patients 2. Is GFR >/= 30 ml/min? Yes.    Patient's GFR today is >60 3. Is SCr </= 2? Yes.   Patient's SCr is 0.79 mg/dL 4. Did SCr increase >/= 0.5 in 24 hours? No. 5.Pt's weight >40kg  Yes.   6. Abnormal electrolyte(s): K  7. Electrolytes replaced per protocol 8.  Call MD STAT for K+ </= 2.5, Phos </= 1, or Mag </= 1 Physician:  Thersa Salt Justis Closser 07/11/2023 3:49 AM

## 2023-07-11 NOTE — Progress Notes (Signed)
NAME:  Joshua Eaton, MRN:  [id number], DOB:  [birth year], LOS: 10 ADMISSION DATE:  07/01/2023, CONSULTATION DATE:  07/01/2023 REFERRING MD:  Particia Nearing, EDP CHIEF COMPLAINT:  Acute on chronic hypoxic respiratory failure   History of Present Illness:  Joshua Eaton is a 67 y/o gentleman with a history of respiratory failure and tracheostomy with recent admission from 10/3-10/28 at [facility] after a 2 week admission at [facility] in September for pneumonia complicated by ARDS and acute cardiogenic pulmonary edema. He has severe MR recently diagnosed and is not a candidate for valve replacement due to overall frailty. He underwent tracheostomy and PEG placement during his admission and was transferred to [facility] on 10/28. Later that day he had an accidental tracheostomy dislodgement, and his trach stayed out. Yesterday he developed increased work of breathing and was transferred to the ED for further evaluation. He was trialed on BiPAP but did not tolerate it well and eventually required reintubation in the ED. He underwent CT scan demonstrating possible contained bowel perforation; Ct with enteral contrast has now been ordered and surgery has evaluated him. PCCM was consulted for admission.   Pertinent  Medical History  HTN Respiratory failure requiring trach Afib RUE DVT- provoked by PICC  Significant Hospital Events: Including procedures, antibiotic start and stop dates in addition to other pertinent events   10/3 initially admitted 10/6 trach 10/9 TEE with severe MR with mitral valve perforation 10/11 cardiothoracic surgery, not a surgical candidate  10/18 trach revision after cuff leak> required reintubation to stabilize 10/24 PEG placement 10/28 discharged to [facility], dislodged trach, which was then left out. 10/31 readmitted for respiratory failure,possible contained bowel perforation 10/31 CT chest/abdomen/pelvis shows diffuse airspace disease 11/1 CT abd/pelvis w/ enteral contrast negative for  gastric or enteric leak - the contrast is visible in his stomach, small bowel, and colon to the level of his rectum without extravasation.  11/4 tracheostomy revision 11/6 successful SBT, on trach collar 11/7 back on ventilator after tachypnea  11/8 continues to get tachypneic on trach collar and having copious thick excretions  Interim History / Subjective:  More interactive and followed some commands this morning for RN & wife.   Objective   Blood pressure (!) 135/52, pulse 81, temperature 98.8 F (37.1 C), temperature source Axillary, resp. rate (!) 27, height 6' 0.99" (1.854 m), weight 94.6 kg, SpO2 95%.    Vent Mode: PRVC FiO2 (%):  [35 %-40 %] 35 % Set Rate:  [20 bmp] 20 bmp Vt Set:  [470 mL] 470 mL PEEP:  [5 cmH20] 5 cmH20 Plateau Pressure:  [19 cmH20] 19 cmH20   Intake/Output Summary (Last 24 hours) at 07/11/2023 1350 Last data filed at 07/11/2023 1200 Gross per 24 hour  Intake 3173.4 ml  Output 2685 ml  Net 488.4 ml   Filed Weights   07/09/23 0430 07/10/23 0347 07/11/23 0500  Weight: 96.4 kg 96.7 kg 94.6 kg    Examination: General: chronically ill appearing man sitting up in bed in NAD HENT: [place]/AT eyes anicteric Lungs: breathing comfortably on vent, thick sputum Cardiovascular: S1S2, RRR Abdomen: soft, NT, no erythema around PEG Extremities: no significant edema, no cyanosis. Minimal muscle mass.  Neuro:  awake, tracking consistently today, maybe nodded yes but would not repeat for me, not nodding no or following other commands for me. Moving all extremities spontaneously. Overall more alert/ interactive.   Sputum: MRSA  Resolved Hospital Problem list     Assessment & Plan:   Acute on chronic respiratory failure requiring  MV and tracheostomy due to prolonged vent wean MRSA pneumonia; previous cultures have all been normal flora Tracheostomy dependent> has self dislodged at [facility] requiring readmission for respiratory failure a few days later -LTVV -con't  vent weaning efforts -VAP prevention protocol -PAD protcol for sedation -stop zosyn, con't vanc.  -family ok with going back to [facility]; insurance auth pending. We discussed that he will need a busy blanket, etc to keep his hands away from his trach -remove trach sutures 11/11 -diuresis for goal euvolemia   Acute metabolic encephalopathy due to ICU delirium -con't seroquel BID -decreased ativan to 0.5mg  BID on 11/9, would con't to decrease this every few days this until off  -need to work on weaning precedex; d/w [employer] fundraiser -wife at bedside all day helps   Severe MR with leaflet perforation; preserved LV function Moderate AI -diuresis for goal euvolemia -needs significant rehabilitation before he would be a candidate for valve replacement; previously consulted TCTS during last admission   Anxiety -con't zoloft -ativan; trying to wean off due to delirium risk   Afib, paroxysmal -con't amiodarone, eliquis  RUE provoked DVT last admission due to PICC line -no central access at present -apixaban full dose needed   moderate protein energy malnutrition -con't TF via peg   Hyperglycemia -SSI PRN -goal BG 140-180   Hypertension -hydralazine q8h    Urinary retention, BPH -unfortunately still needs foley -if able to take pills, restart tamsulosin   Previous AKI -strict I/O -monitor periodically  Severe deconditioning -recommend LTAC -PT, OT   Perineal moisture associated skin breakdown -con't Foley, flexiseal  Wife updated at bedside.   Best Practice (right click and "Reselect all SmartList Selections" daily)   Diet/type: tubefeeds DVT prophylaxis: DOAC GI prophylaxis: PPI Lines: N/A Foley:  Yes, and it is still needed Code Status:  full code Last date of multidisciplinary goals of care discussion [07/10/2023]  Labs   CBC: Recent Labs  Lab 07/05/23 0710 07/06/23 0339 07/07/23 0303 07/07/23 2125 07/08/23 0318 07/09/23 0822 07/11/23 0236  WBC 9.6 7.1 9.3  --   10.8* 10.1 8.4  NEUTROABS 5.9  --   --   --   --   --   --   HGB 7.6* 7.3* 7.0* 8.4* 8.1* 8.5* 8.1*  HCT 24.4* 23.8* 23.1* 26.8* 25.9* 26.4* 26.8*  MCV 105.6* 106.3* 104.5*  --  100.4* 100.0 102.7*  PLT 137* 117* 128*  --  124* 130* 134*    Basic Metabolic Panel: Recent Labs  Lab 07/05/23 0710 07/06/23 0339 07/06/23 0340 07/07/23 0303 07/08/23 0318 07/09/23 0257 07/11/23 0236  NA 146* 144  --  146* 145 144 146*  K 4.3 3.4*  --  4.1 3.8 4.6 2.9*  CL 109 107  --  109 106 104 105  CO2 27 32  --  32 32 31 30  GLUCOSE 125* 162*  --  133* 134* 140* 166*  BUN 73* 60*  --  43* 46* 56* 46*  CREATININE 1.19 1.04  --  0.89 0.88 0.87 0.79  CALCIUM 8.9 8.8*  --  8.6* 8.3* 7.5* 8.1*  MG 3.1*  --  2.5* 2.6* 2.5* 2.4  --   PHOS 3.2 3.0 3.0 2.7 1.9* 3.1  --    GFR: Estimated Creatinine Clearance: 98.5 mL/min (by C-G formula based on SCr of 0.79 mg/dL). Recent Labs  Lab 07/07/23 0303 07/08/23 0318 07/09/23 0822 07/11/23 0236  WBC 9.3 10.8* 10.1 8.4    Liver Function Tests: Recent Labs  Lab 07/06/23  1478 07/07/23 0303 07/08/23 0318 07/09/23 0257  ALBUMIN 1.9* 1.9* 1.8* 1.6*      Critical care time:       This patient is critically ill with multiple organ system failure which requires frequent high complexity decision making, assessment, support, evaluation, and titration of therapies. This was completed through the application of advanced monitoring technologies and extensive interpretation of multiple databases. During this encounter critical care time was devoted to patient care services described in this note for 33 minutes.  Steffanie Dunn, DO 07/11/23 2:04 PM [facility]  For contact information, see Amion. If no response to pager, please call PCCM consult pager. After hours, 7PM- 7AM, please call Elink.

## 2023-07-12 DIAGNOSIS — J9621 Acute and chronic respiratory failure with hypoxia: Secondary | ICD-10-CM | POA: Diagnosis not present

## 2023-07-12 LAB — BASIC METABOLIC PANEL
Anion gap: 11 (ref 5–15)
BUN: 35 mg/dL — ABNORMAL HIGH (ref 8–23)
CO2: 31 mmol/L (ref 22–32)
Calcium: 8.4 mg/dL — ABNORMAL LOW (ref 8.9–10.3)
Chloride: 103 mmol/L (ref 98–111)
Creatinine, Ser: 0.76 mg/dL (ref 0.61–1.24)
GFR, Estimated: >60 mL/min (ref >60)
Glucose, Bld: 156 mg/dL — ABNORMAL HIGH (ref 70–99)
Potassium: 3.6 mmol/L (ref 3.5–5.1)
Sodium: 145 mmol/L (ref 135–145)

## 2023-07-12 LAB — GLUCOSE, CAPILLARY
Glucose-Capillary: 154 mg/dL — ABNORMAL HIGH (ref 70–99)
Glucose-Capillary: 138 mg/dL — ABNORMAL HIGH (ref 70–99)
Glucose-Capillary: 148 mg/dL — ABNORMAL HIGH (ref 70–99)
Glucose-Capillary: 154 mg/dL — ABNORMAL HIGH (ref 70–99)
Glucose-Capillary: 122 mg/dL — ABNORMAL HIGH (ref 70–99)
Glucose-Capillary: 124 mg/dL — ABNORMAL HIGH (ref 70–99)

## 2023-07-12 LAB — VANCOMYCIN, PEAK: Vancomycin Pk: 41 ug/mL — ABNORMAL HIGH (ref 30–40)

## 2023-07-12 LAB — VANCOMYCIN, TROUGH: Vancomycin Tr: 32 ug/mL (ref 15–20)

## 2023-07-12 MED ORDER — QUETIAPINE FUMARATE 50 MG PO TABS
50.0000 mg | ORAL_TABLET | Freq: Every morning | ORAL | Status: DC
  Administered 2023-07-13: 50 mg
  Filled 2023-07-12: qty 1

## 2023-07-12 MED ORDER — CLONAZEPAM 0.5 MG PO TABS
0.5000 mg | ORAL_TABLET | Freq: Two times a day (BID) | ORAL | Status: DC
  Administered 2023-07-12 – 2023-07-13 (×2): 0.5 mg
  Filled 2023-07-12 (×2): qty 1

## 2023-07-12 MED ORDER — METOLAZONE 2.5 MG PO TABS: 5.0000 mg | ORAL_TABLET | Freq: Once | ORAL | Status: DC

## 2023-07-12 MED ORDER — FUROSEMIDE 10 MG/ML IJ SOLN
60.0000 mg | Freq: Two times a day (BID) | INTRAMUSCULAR | Status: DC
  Administered 2023-07-12 – 2023-07-13 (×2): 60 mg via INTRAVENOUS
  Filled 2023-07-12 (×2): qty 6

## 2023-07-12 MED ORDER — METOLAZONE 2.5 MG PO TABS
5.0000 mg | ORAL_TABLET | Freq: Once | ORAL | Status: AC
  Administered 2023-07-12: 5 mg
  Filled 2023-07-12: qty 2

## 2023-07-12 MED ORDER — SODIUM CHLORIDE 3 % IN NEBU
4.0000 mL | INHALATION_SOLUTION | Freq: Two times a day (BID) | RESPIRATORY_TRACT | Status: DC
  Administered 2023-07-12 – 2023-07-13 (×2): 4 mL via RESPIRATORY_TRACT
  Filled 2023-07-12 (×2): qty 15

## 2023-07-12 MED ORDER — POTASSIUM CHLORIDE 20 MEQ PO PACK
40.0000 meq | PACK | Freq: Once | ORAL | Status: AC
  Administered 2023-07-12: 40 meq
  Filled 2023-07-12: qty 2

## 2023-07-12 MED ORDER — QUETIAPINE FUMARATE 100 MG PO TABS
100.0000 mg | ORAL_TABLET | Freq: Every day | ORAL | Status: DC
  Administered 2023-07-12: 100 mg
  Filled 2023-07-12: qty 1

## 2023-07-12 NOTE — TOC Progression Note (Signed)
Transition of Care [facility]) - Progression Note    Patient Details  Name: Tajir Nesbitt MRN: [id number] Date of Birth: 1956-03-31  Transition of Care [facility]) CM/SW Contact  Harriet Masson, RN Phone Number: 07/12/2023, 1:24 PM  Clinical Narrative:    Sherron Monday to wife, [name], who is agreeable for [facility] to start auth.  Notified Evan with [facility].    Expected Discharge Plan: Long Term Acute Care (LTAC) Barriers to Discharge: Continued Medical Work up  Expected Discharge Plan and Services                                               Social Determinants of Health (SDOH) Interventions SDOH Screenings   Food Insecurity: Patient Unable To Answer (06/29/2023)   Received from [facility]  Transportation Needs: Patient Unable To Answer (06/29/2023)   Received from [facility]  Utilities: Patient Unable To Answer (06/29/2023)   Received from [facility]  Financial Resource Strain: Patient Unable To Answer (06/29/2023)   Received from [facility]  Social Connections: Patient Unable To Answer (06/29/2023)   Received from [facility]  Stress: Patient Unable To Answer (06/29/2023)   Received from [facility]  Tobacco Use: Low Risk  (07/01/2023)    Readmission Risk Interventions     No data to display

## 2023-07-12 NOTE — Evaluation (Signed)
Occupational Therapy Evaluation Patient Details Name: Joshua Eaton MRN: [id number] DOB: 1955-09-30 Today's Date: 07/12/2023   History of Present Illness 67 y.o. male presents to [facility] hospital on 07/01/2023 from [facility] with worsening respiratory status after trach dislodgement on 10/28. Imaging concerning for pneumatosis. Trach revision on 11/4. Requiring ventilator support vs trach collar trials intermittently. Hospitalization complicated by ICU delirium. Pt recently admitted 9/19-10/28 due to respiratory failure requiring trach and PEG. PMHx: colon CA s/p Rt hemicolectomy, MVR, COPD.   Clinical Impression   Pt presents now w/ deficits in cognition, strength and cardiopulmonary endurance. Pt following one step commands intermittently during session though requires redirections to tasks due to restlessness and reaching for lines. Pt requires Max A x 2 for bed mobility to roll side to side and up to Total A x 2 for LB ADLs bed level, including peri care d/t bowel incontinence. Pt's wife at bedside, reports assisting with bed level PROM w/ pt to reduce tightness. Recommend DC back to [facility] once medically stable.  SpO2 briefly to 88% on PRVC, 35% FiO2; Quickly back to 100% with RN suctioning at bedside. PEEP 5. RR up to 62 w/ activity.       If plan is discharge home, recommend the following: Two people to help with walking and/or transfers;Two people to help with bathing/dressing/bathroom    Functional Status Assessment  Patient has had a recent decline in their functional status and/or demonstrates limited ability to make significant improvements in function in a reasonable and predictable amount of time  Equipment Recommendations  Hospital bed;Hoyer lift    Recommendations for Other Services       Precautions / Restrictions Precautions Precautions: Fall;Other (comment) Precaution Comments: trach collar, PEG Restrictions Weight Bearing Restrictions: No      Mobility Bed  Mobility Overal bed mobility: Needs Assistance Bed Mobility: Rolling Rolling: Max assist, +2 for physical assistance, +2 for safety/equipment, Used rails         General bed mobility comments: Rolling side to side for peri care/linen change. Pt required multimodal tactile cues to sequence, assist to keep LE in sidelying position. able to hold to bedrail and reach to side when cued but difficulty sustaining attention to task    Transfers                          Balance                                           ADL either performed or assessed with clinical judgement   ADL Overall ADL's : Needs assistance/impaired Eating/Feeding: NPO   Grooming: Maximal assistance;Bed level   Upper Body Bathing: Maximal assistance;Bed level   Lower Body Bathing: Total assistance;+2 for safety/equipment;+2 for physical assistance;Bed level   Upper Body Dressing : Bed level;Total assistance Upper Body Dressing Details (indicate cue type and reason): cued to reach UE into sleeve though pt shook head. Lower Body Dressing: Total assistance;+2 for physical assistance;+2 for safety/equipment;Bed level       Toileting- Clothing Manipulation and Hygiene: Total assistance;+2 for safety/equipment;+2 for physical assistance;Bed level Toileting - Clothing Manipulation Details (indicate cue type and reason): bowel incontinence; requires at least +2 assist to keep LUE from reaching and to keep pt maintaining in sidelying due to constant movement, kicking LE off of bed, etc  Vision Ability to See in Adequate Light: 0 Adequate Patient Visual Report: No change from baseline (appears wFL) Vision Assessment?: No apparent visual deficits Additional Comments: appears WFL; to be further assessed; able to look to therapist each time name was called     Perception         Praxis         Pertinent Vitals/Pain Pain Assessment Pain Assessment: Faces Faces Pain  Scale: Hurts a little bit Pain Location: generalized w/ movement Pain Descriptors / Indicators: Grimacing Pain Intervention(s): Monitored during session, Limited activity within patient's tolerance     Extremity/Trunk Assessment Upper Extremity Assessment Upper Extremity Assessment: Difficult to assess due to impaired cognition;Right hand dominant;LUE deficits/detail;RUE deficits/detail RUE Deficits / Details: did squeeze therapist's hand when cued, not moving this UE as much as the LUE LUE Deficits / Details: near constant movement of LUE; required redirection away from reaching to lines/trach, etc   Lower Extremity Assessment Lower Extremity Assessment: Defer to PT evaluation   Cervical / Trunk Assessment Cervical / Trunk Assessment: Normal   Communication Communication Communication: Tracheostomy Cueing Techniques: Gestural cues;Verbal cues   Cognition Arousal: Alert Behavior During Therapy: Restless Overall Cognitive Status: Difficult to assess                                 General Comments: nodding head yes (slightly) and no (visibly), follows commands variably (50% of the time and if not distracted?). did squeeze hand when cued, restless and needs constant distraction from reaching with LUE to lines     General Comments  Pt with increased facial redness and RR up to 62 with peri care assist bed level. Desat to 88% on PRVC 35% FiO2. With RN at bedside and to suction, increased to 100%. HR low 100s    Exercises     Shoulder Instructions      Home Living Family/patient expects to be discharged to:: Unsure                                 Additional Comments: Readmitted from [facility]. prior to this was living with spouse      Prior Functioning/Environment Prior Level of Function : Patient poor historian/Family not available             Mobility Comments: assume pt ambulatory prior to initial hospitalization and DC to [facility]; has  been primarily bedbound since October hospitalization ADLs Comments: Assume independent with ADLs prior to initial October admission. has required extensive assist bed level since hospital stay and DC to [facility]        OT Problem List: Decreased activity tolerance;Impaired balance (sitting and/or standing);Impaired vision/perception;Decreased cognition;Decreased coordination;Impaired UE functional use;Increased edema      OT Treatment/Interventions: Self-care/ADL training;Therapeutic exercise;DME and/or AE instruction;Manual therapy;Splinting;Therapeutic activities;Cognitive remediation/compensation;Visual/perceptual remediation/compensation;Patient/family education;Balance training    OT Goals(Current goals can be found in the care plan section) Acute Rehab OT Goals Patient Stated Goal: wife hopeful for pt to be able to sit EOB with therapy OT Goal Formulation: With family Time For Goal Achievement: 07/26/23 Potential to Achieve Goals: Fair ADL Goals Pt Will Perform Grooming: with mod assist;bed level Pt Will Perform Upper Body Bathing: bed level;with mod assist Additional ADL Goal #1: Pt to follow one step commands 75% of the time Additional ADL Goal #2: Pt to sit EOB with no more than Min A  for sitting balance > 3 min during functional tasks  OT Frequency: Min 1X/week    Co-evaluation PT/OT/SLP Co-Evaluation/Treatment: Yes Reason for Co-Treatment: Necessary to address cognition/behavior during functional activity;Complexity of the patient's impairments (multi-system involvement);For patient/therapist safety;To address functional/ADL transfers   OT goals addressed during session: ADL's and self-care;Strengthening/ROM      AM-PAC OT "6 Clicks" Daily Activity     Outcome Measure Help from another person eating meals?: Total Help from another person taking care of personal grooming?: A Lot Help from another person toileting, which includes using toliet, bedpan, or urinal?: Total Help  from another person bathing (including washing, rinsing, drying)?: Total Help from another person to put on and taking off regular upper body clothing?: Total Help from another person to put on and taking off regular lower body clothing?: Total 6 Click Score: 7   End of Session Nurse Communication: Mobility status  Activity Tolerance: Other (comment) (limited by restlessness) Patient left: in bed;with call bell/phone within reach;with family/visitor present;with nursing/sitter in room;Other (comment) (lab at bedside)  OT Visit Diagnosis: Muscle weakness (generalized) (M62.81);Other symptoms and signs involving the nervous system (R29.898);Other symptoms and signs involving cognitive function                Time: 1610-9604 OT Time Calculation (min): 31 min Charges:  OT General Charges $OT Visit: 1 Visit OT Evaluation $OT Eval High Complexity: 1 High  Bradd Canary, OTR/L Acute Rehab Services Office: [phone]   Lorre Munroe 07/12/2023, 1:16 PM

## 2023-07-12 NOTE — Evaluation (Signed)
Physical Therapy Evaluation Patient Details Name: Joshua Eaton MRN: [id number] DOB: Jul 16, 1956 Today's Date: 07/12/2023  History of Present Illness  68 y.o. male presents to [facility] hospital on 07/01/2023 from [facility] with worsening respiratory status after trach dislodgement on 10/28. Imaging concerning for pneumatosis. Trach revision on 11/4. Requiring ventilator support vs trach collar trials intermittently. Hospitalization complicated by ICU delirium. Pt recently admitted 9/19-10/28 due to respiratory failure requiring trach and PEG. PMHx: colon CA s/p Rt hemicolectomy, MVR, COPD.  Clinical Impression  Pt admitted with above complications. Currently requires +2 max assist for bed mobility, rolling to assist with peri-care due to bowel incontinence in bed. Able to sustain grip on rail and pull LEs up with hand over hand assist/guidance but shortly sustained due to poor attention span. Wife present, supportive, reviewed LEs previously and demonstrates what she has been doing for Pt while he was unresponsive recently. Pt more engaging today, responds to simple commands inconsistently. Nods yes appropriately. Pt currently with functional limitations due to the deficits listed below (see PT Problem List). Pt will benefit from acute skilled PT to increase their independence and safety with mobility to allow discharge.           If plan is discharge home, recommend the following: Two people to help with walking and/or transfers;Two people to help with bathing/dressing/bathroom;Assistance with cooking/housework;Assist for transportation   Can travel by private vehicle        Equipment Recommendations None recommended by PT  Recommendations for Other Services       Functional Status Assessment Patient has had a recent decline in their functional status and demonstrates the ability to make significant improvements in function in a reasonable and predictable amount of time.     Precautions /  Restrictions Precautions Precautions: Fall;Other (comment) Precaution Comments: trach collar, PEG, sacral wound Restrictions Weight Bearing Restrictions: No      Mobility  Bed Mobility Overal bed mobility: Needs Assistance Bed Mobility: Rolling Rolling: Max assist, +2 for physical assistance, +2 for safety/equipment, Used rails         General bed mobility comments: Rolling side to side for peri care/linen change. Pt required multimodal tactile cues to sequence, assist to keep LE in sidelying position, to reach with UEs and grasp rail, which is shortly sustained due to short attention span. performed multiple times during session.    Transfers                        Ambulation/Gait               General Gait Details: Limited by need for pericare following BM in bed  Stairs            Wheelchair Mobility     Tilt Bed    Modified Rankin (Stroke Patients Only)       Balance                                             Pertinent Vitals/Pain Pain Assessment Pain Assessment: Faces Faces Pain Scale: Hurts a little bit Pain Location: generalized w/ movement, buttocks Pain Descriptors / Indicators: Grimacing Pain Intervention(s): Monitored during session, Repositioned    Home Living Family/patient expects to be discharged to:: Unsure                   Additional  Comments: Readmitted from [facility]. prior to this was living with spouse    Prior Function Prior Level of Function : Patient poor historian/Family not available             Mobility Comments: assume pt ambulatory prior to initial hospitalization and DC to [facility]; has been primarily bedbound since October hospitalization ADLs Comments: Assume independent with ADLs prior to initial October admission. has required extensive assist bed level since hospital stay and DC to [facility]     Extremity/Trunk Assessment   Upper Extremity Assessment Upper Extremity  Assessment: Defer to OT evaluation RUE Deficits / Details: did squeeze therapist's hand when cued, not moving this UE as much as the LUE LUE Deficits / Details: near constant movement of LUE; required redirection away from reaching to lines/trach, etc    Lower Extremity Assessment Lower Extremity Assessment: Generalized weakness;Difficult to assess due to impaired cognition RLE Deficits / Details: Moving spontaneously against gravity LLE Deficits / Details: Moving spontaneously against gravity    Cervical / Trunk Assessment Cervical / Trunk Assessment: Normal  Communication   Communication Communication: Tracheostomy Cueing Techniques: Verbal cues;Gestural cues;Tactile cues  Cognition Arousal: Alert Behavior During Therapy: Restless Overall Cognitive Status: Difficult to assess                                 General Comments: nodding head yes (slightly) and no (visibly), follows commands variably (50% of the time and if not distracted?). did squeeze hand when cued, restless and needs constant distraction from reaching with LUE to lines        General Comments General comments (skin integrity, edema, etc.): Pt with increased facial redness and RR up to 62 with peri care assist bed level. Desat to 88% on PRVC 35% FiO2. With RN at bedside and to suction, increased to 100%. HR low 100s    Exercises Other Exercises Other Exercises: Gentle motion through LEs for range with tasks today; reviewed with wife who has been diligently assisting with PROM while pt sedated recently.   Assessment/Plan    PT Assessment Patient needs continued PT services  PT Problem List Decreased strength;Decreased range of motion;Decreased activity tolerance;Decreased balance;Decreased mobility;Decreased coordination;Decreased cognition;Decreased knowledge of use of DME;Decreased safety awareness;Decreased knowledge of precautions;Cardiopulmonary status limiting activity;Decreased skin  integrity;Pain       PT Treatment Interventions DME instruction;Gait training;Functional mobility training;Therapeutic activities;Therapeutic exercise;Balance training;Neuromuscular re-education;Cognitive remediation;Patient/family education;Wheelchair mobility training;Modalities    PT Goals (Current goals can be found in the Care Plan section)  Acute Rehab PT Goals Patient Stated Goal: n/a PT Goal Formulation: Patient unable to participate in goal setting Time For Goal Achievement: 07/26/23 Potential to Achieve Goals: Fair    Frequency Min 1X/week     Co-evaluation PT/OT/SLP Co-Evaluation/Treatment: Yes Reason for Co-Treatment: Necessary to address cognition/behavior during functional activity;Complexity of the patient's impairments (multi-system involvement);For patient/therapist safety;To address functional/ADL transfers PT goals addressed during session: Mobility/safety with mobility OT goals addressed during session: ADL's and self-care;Strengthening/ROM       AM-PAC PT "6 Clicks" Mobility  Outcome Measure Help needed turning from your back to your side while in a flat bed without using bedrails?: Total Help needed moving from lying on your back to sitting on the side of a flat bed without using bedrails?: Total Help needed moving to and from a bed to a chair (including a wheelchair)?: Total Help needed standing up from a chair using your arms (e.g.,  wheelchair or bedside chair)?: Total Help needed to walk in hospital room?: Total Help needed climbing 3-5 steps with a railing? : Total 6 Click Score: 6    End of Session   Activity Tolerance: Other (comment) (limited due to bowel incontinence in bed) Patient left: in bed;with bed alarm set;with SCD's reapplied;with call bell/phone within reach;with nursing/sitter in room;with family/visitor present;with restraints reapplied (Mitt) Nurse Communication: Mobility status PT Visit Diagnosis: Muscle weakness (generalized)  (M62.81);Difficulty in walking, not elsewhere classified (R26.2);Other symptoms and signs involving the nervous system (R29.898);Pain Pain - part of body:  (generalized, buttock)    Time: 2952-8413 PT Time Calculation (min) (ACUTE ONLY): 31 min   Charges:   PT Evaluation $PT Eval High Complexity: 1 High   PT General Charges $$ ACUTE PT VISIT: 1 Visit         Kathlyn Sacramento, PT, DPT [facility]  Rehabilitation Services Physical Therapist Office: [phone] Website: [email or web address]   Berton Mount 07/12/2023, 1:25 PM

## 2023-07-12 NOTE — Progress Notes (Signed)
Patient placed on 35% ATC.  Currently tolerating well.  Will continue to monitor.

## 2023-07-12 NOTE — Progress Notes (Signed)
RT NOTE: sutures removed from trach site without complications.  Noted redness around trach flange.  Will continue to monitor.

## 2023-07-12 NOTE — Progress Notes (Signed)
NAME:  Joshua Eaton, MRN:  [id number], DOB:  [birth year], LOS: 11 ADMISSION DATE:  07/01/2023, CONSULTATION DATE:  07/01/2023 REFERRING MD:  Particia Nearing, EDP CHIEF COMPLAINT:  Acute on chronic hypoxic respiratory failure   History of Present Illness:  Joshua Eaton is a 67 y/o gentleman with a history of respiratory failure and tracheostomy with recent admission from 10/3-10/28 at [facility] after a 2 week admission at [facility] in September for pneumonia complicated by ARDS and acute cardiogenic pulmonary edema. He has severe MR recently diagnosed and is not a candidate for valve replacement due to overall frailty. He underwent tracheostomy and PEG placement during his admission and was transferred to [facility] on 10/28. Later that day he had an accidental tracheostomy dislodgement, and his trach stayed out. Yesterday he developed increased work of breathing and was transferred to the ED for further evaluation. He was trialed on BiPAP but did not tolerate it well and eventually required reintubation in the ED. He underwent CT scan demonstrating possible contained bowel perforation; Ct with enteral contrast has now been ordered and surgery has evaluated him. PCCM was consulted for admission.   Pertinent  Medical History  HTN Respiratory failure requiring trach Afib RUE DVT- provoked by PICC  Significant Hospital Events: Including procedures, antibiotic start and stop dates in addition to other pertinent events   10/3 initially admitted 10/6 trach 10/9 TEE with severe MR with mitral valve perforation 10/11 cardiothoracic surgery, not a surgical candidate  10/18 trach revision after cuff leak> required reintubation to stabilize 10/24 PEG placement 10/28 discharged to [facility], dislodged trach, which was then left out. 10/31 readmitted for respiratory failure,possible contained bowel perforation 10/31 CT chest/abdomen/pelvis shows diffuse airspace disease 11/1 CT abd/pelvis w/ enteral contrast negative for  gastric or enteric leak - the contrast is visible in his stomach, small bowel, and colon to the level of his rectum without extravasation.  11/4 tracheostomy revision 11/6 successful SBT, on trach collar 11/7 back on ventilator after tachypnea  11/8 continues to get tachypneic on trach collar and having copious thick excretions 11/11 still on and off trach collar with need for vent support  Interim History / Subjective:  Awake and following commands this morning. Trying to speak. Denies any pain around the tracheostomy.  Objective   Blood pressure (!) 114/50, pulse 66, temperature 98.7 F (37.1 C), temperature source Oral, resp. rate (!) 21, height 6\' 1"  (1.854 m), weight 92.8 kg, SpO2 98%.    Vent Mode: PRVC FiO2 (%):  [30 %-40 %] 35 % Set Rate:  [20 bmp] 20 bmp Vt Set:  [470 mL] 470 mL PEEP:  [5 cmH20] 5 cmH20   Intake/Output Summary (Last 24 hours) at 07/12/2023 0631 Last data filed at 07/12/2023 0200 Gross per 24 hour  Intake 3831.93 ml  Output 1275 ml  Net 2556.93 ml   Filed Weights   07/10/23 0347 07/11/23 0500 07/12/23 0500  Weight: 96.7 kg 94.6 kg 92.8 kg    Examination: General: thin adult male in NAD HENT: [place]/AT, PERRL.  Lungs: Bilateral ventilated breath sounds, no accessory muscle use Cardiovascular: RRR, 4/6 murmur Abdomen: Soft, NT, ND Extremities: 1+ pitting edema in the lower extremities bilaterally Neuro:awake but not following commands  Resolved Hospital Problem list     Assessment & Plan:  Acute on chronic respiratory failure requiring MV and tracheostomy due to prolonged vent wean MRSA pneumonia; previous cultures have all been normal flora Tracheostomy dependent> has self dislodged at [facility] requiring readmission for respiratory failure a few  days later -LTVV -con't vent weaning efforts -VAP prevention protocol -PAD protcol for sedation - con't vanc.  -family ok with going back to [facility]; insurance auth pending. We discussed that he will need a  busy blanket, etc to keep his hands away from his trach -remove trach sutures 11/11 -diuresis for goal euvolemia, lasix and metolazone today, stop FWF -HTS nebs BID   Acute metabolic encephalopathy due to ICU delirium -con't seroquel but decrease AM dose to 25 mg and continue 100 mg in PM -change to clonazepam 0.5 mg bid  -need to work on weaning precedex; d/w [employer] fund[employer] -wife at bedside all day helps   Severe MR with leaflet perforation; preserved LV function Moderate AI -diuresis for goal euvolemia -needs significant rehabilitation before he would be a candidate for valve replacement; previously consulted TCTS during last admission   Anxiety -con't zoloft -ativan; trying to wean off due to delirium risk   Afib, paroxysmal -con't amiodarone, eliquis   RUE provoked DVT last admission due to PICC line -no central access at present -apixaban full dose needed   moderate protein energy malnutrition -con't TF via peg   Hyperglycemia -SSI PRN -goal BG 140-180   Hypertension -hydralazine q8h    Urinary retention, BPH -unfortunately still needs foley -if able to take pills, restart tamsulosin   Previous AKI -strict I/O -monitor periodically   Severe deconditioning -recommend LTAC -PT, OT   Perineal moisture associated skin breakdown -con't Foley, flexiseal   Best Practice (right click and "Reselect all SmartList Selections" daily)   Diet/type: tubefeeds DVT prophylaxis: DOAC GI prophylaxis: PPI Lines: N/A Foley:  Yes, and it is still needed Code Status:  full code Last date of multidisciplinary goals of care discussion [07/12/2023]  Labs   CBC: Recent Labs  Lab 07/05/23 0710 07/06/23 0339 07/07/23 0303 07/07/23 2125 07/08/23 0318 07/09/23 0822 07/11/23 0236  WBC 9.6 7.1 9.3  --  10.8* 10.1 8.4  NEUTROABS 5.9  --   --   --   --   --   --   HGB 7.6* 7.3* 7.0* 8.4* 8.1* 8.5* 8.1*  HCT 24.4* 23.8* 23.1* 26.8* 25.9* 26.4* 26.8*  MCV 105.6* 106.3* 104.5*  --   100.4* 100.0 102.7*  PLT 137* 117* 128*  --  124* 130* 134*    Basic Metabolic Panel: Recent Labs  Lab 07/05/23 0710 07/06/23 0339 07/06/23 0340 07/07/23 0303 07/08/23 0318 07/09/23 0257 07/11/23 0236  NA 146* 144  --  146* 145 144 146*  K 4.3 3.4*  --  4.1 3.8 4.6 2.9*  CL 109 107  --  109 106 104 105  CO2 27 32  --  32 32 31 30  GLUCOSE 125* 162*  --  133* 134* 140* 166*  BUN 73* 60*  --  43* 46* 56* 46*  CREATININE 1.19 1.04  --  0.89 0.88 0.87 0.79  CALCIUM 8.9 8.8*  --  8.6* 8.3* 7.5* 8.1*  MG 3.1*  --  2.5* 2.6* 2.5* 2.4  --   PHOS 3.2 3.0 3.0 2.7 1.9* 3.1  --    GFR: Estimated Creatinine Clearance: 98.5 mL/min (by C-G formula based on SCr of 0.79 mg/dL). Recent Labs  Lab 07/07/23 0303 07/08/23 0318 07/09/23 0822 07/11/23 0236  WBC 9.3 10.8* 10.1 8.4    Liver Function Tests: Recent Labs  Lab 07/06/23 0339 07/07/23 0303 07/08/23 0318 07/09/23 0257  ALBUMIN 1.9* 1.9* 1.8* 1.6*   No results for input(s): "LIPASE", "AMYLASE" in the last 168 hours.  No results for input(s): "AMMONIA" in the last 168 hours.  ABG    Component Value Date/Time   PHART 7.36 07/02/2023 0145   PCO2ART 46 07/02/2023 0145   PO2ART 120 (H) 07/02/2023 0145   HCO3 26.6 07/02/2023 0145   TCO2 27 07/01/2023 2203   ACIDBASEDEF 2.0 07/01/2023 2203   O2SAT 99.5 07/02/2023 0145     Coagulation Profile: No results for input(s): "INR", "PROTIME" in the last 168 hours.   Cardiac Enzymes: No results for input(s): "CKTOTAL", "CKMB", "CKMBINDEX", "TROPONINI" in the last 168 hours.  HbA1C: Hgb A1c MFr Bld  Date/Time Value Ref Range Status  06/30/2023 04:58 AM 4.1 (L) 4.8 - 5.6 % Final    Comment:    (NOTE) Pre diabetes:          5.7%-6.4%  Diabetes:              >6.4%  Glycemic control for   <7.0% adults with diabetes   12/09/2015 06:30 PM 5.1 4.8 - 5.6 % Final    Comment:    (NOTE)         Pre-diabetes: 5.7 - 6.4         Diabetes: >6.4         Glycemic control for adults  with diabetes: <7.0     CBG: Recent Labs  Lab 07/11/23 1100 07/11/23 1537 07/11/23 1928 07/11/23 2320 07/12/23 0332  GLUCAP 155* 159* 189* 132* 154*    Review of Systems:   Unable to obtain   Past Medical History:  He,  has a past medical history of Anxiety, Cancer (HCC), Depression, Hypertension, and OCD (obsessive compulsive disorder).   Surgical History:   Past Surgical History:  Procedure Laterality Date   CHOLECYSTECTOMY     COLON SURGERY     cancerous polyp removed   HERNIA REPAIR     IR GASTROSTOMY TUBE MOD SED  06/24/2023     Social History:   reports that he has never smoked. He has never used smokeless tobacco. He reports that he does not drink alcohol and does not use drugs.   Family History:  His family history is not on file.   Allergies Allergies  Allergen Reactions   Codeine     "sends me into orbit"     Home Medications  Prior to Admission medications   Medication Sig Start Date End Date Taking? Authorizing Provider  acetaminophen (TYLENOL) 325 MG tablet Take 650 mg by mouth every 6 (six) hours as needed for moderate pain.   Yes [provider]  ACIDOPHILUS LACTOBACILLUS PO Place 1 capsule into feeding tube daily.   Yes [provider]  amiodarone (PACERONE) 200 MG tablet Place 1 tablet (200 mg total) into feeding tube 2 (two) times daily for 4 days, THEN 1 tablet (200 mg total) daily. 06/28/23 08/01/23 Yes Rhetta Mura, MD  apixaban (ELIQUIS) 5 MG TABS tablet Place 1 tablet (5 mg total) into feeding tube 2 (two) times daily. 06/28/23  Yes Rhetta Mura, MD  aspirin 81 MG chewable tablet Place 81 mg into feeding tube daily.   Yes [provider]  ceFEPIme 2 g in sodium chloride 0.9 % 100 mL Inject 2 g into the vein every 8 (eight) hours.   Yes [provider]  dextrose (GLUTOSE) 40 % GEL Take 15 g by mouth daily as needed for low blood sugar (70mg /dL).   Yes [provider]   Electrolyte-A in Dextrose (DEXTROSE 50%/ELECTROLYTES IV) Inject 25 g into the vein  daily as needed (blood sugar 50mg /dL).   Yes [provider]  feeding supplement (OSMOLITE 1 CAL) LIQD Take 1,000 mLs by mouth daily. 65 mL/hr 06/28/23  Yes Rhetta Mura, MD  furosemide (LASIX) 10 MG/ML injection Inject 10 mg into the muscle once.   Yes [provider]  Glucagon HCl 1 MG SOLR Inject 1 mg as directed once as needed (hypoglycemia).   Yes [provider]  hydrALAZINE (APRESOLINE) 10 MG tablet Place 10 mg into feeding tube every 8 (eight) hours.   Yes [provider]  HYDROcodone-acetaminophen (NORCO/VICODIN) 5-325 MG tablet Take 1 tablet by mouth every 6 (six) hours as needed for moderate pain (pain score 4-6).   Yes [provider]  insulin lispro (HUMALOG) 100 UNIT/ML injection Inject 0-6 Units into the skin 3 (three) times daily before meals. Per Sliding Scale: Blood sugar very low dose <70 initiate hypoglycemic treatment 70-200: No insulin 201-250: 2 units 251-300: 3 units 301-350: 4 units 351-400: 5 units >400: 6 units call medical provider   Yes [provider]  ipratropium-albuterol (DUONEB) 0.5-2.5 (3) MG/3ML SOLN Take 3 mLs by nebulization every 4 (four) hours as needed (sob/wheezing).   Yes [provider]  loperamide (IMODIUM) 2 MG capsule Take 2 mg by mouth in the morning, at noon, and at bedtime.   Yes [provider]  LORazepam (ATIVAN) 0.5 MG tablet Take 1 tablet (0.5 mg total) by mouth in the morning. Patient taking differently: Take 0.5 mg by mouth every 8 (eight) hours as needed for anxiety. 06/28/23  Yes Rhetta Mura, MD  magnesium oxide (MAG-OX) 400 (240 Mg) MG tablet Take 400 mg by mouth every 6 (six) hours as needed (magnesium electrolyte replacement).   Yes [provider]  methylPREDNISolone sodium succinate (SOLU-MEDROL) 40 mg/mL injection Inject 80 mg into the vein every 8  (eight) hours.   Yes [provider]  metoprolol tartrate (LOPRESSOR) 25 MG tablet Place 0.5 tablets (12.5 mg total) into feeding tube 2 (two) times daily. 06/28/23  Yes Rhetta Mura, MD  metroNIDAZOLE (FLAGYL) 500 MG/100ML Inject 500 mg into the vein every 8 (eight) hours.   Yes [provider]  Morphine Sulfate, PF, 2 MG/ML SOLN Inject 2 mg as directed once as needed (pain.).   Yes [provider]  pantoprazole (PROTONIX) 2 mg/mL suspension Place 40 mg into feeding tube daily.   Yes [provider]  potassium chloride SA (KLOR-CON M) 20 MEQ tablet Take 40 mEq by mouth 2 (two) times daily. Or every 4 hours as needed for potassium electrolyte replacement.   Yes [provider]  QUEtiapine (SEROQUEL) 25 MG tablet Place 1 tablet (25 mg total) into feeding tube 2 (two) times daily. 06/28/23  Yes Rhetta Mura, MD  simethicone (MYLICON) 80 MG chewable tablet Place 80 mg into feeding tube 4 (four) times daily.   Yes [provider]  metroNIDAZOLE (FLAGYL) 500 MG tablet Place 500 mg into feeding tube every 8 (eight) hours. Patient not taking: Reported on 07/02/2023    [provider]  Water For Irrigation, Sterile (FREE WATER) SOLN Place 200 mLs into feeding tube every 6 (six) hours. 06/28/23   Rhetta Mura, MD     Critical care time: 59    Rocky Morel, DO Internal Medicine Resident, PGY-2 Pager# [phone] [facility] 07/12/2023 6:32 AM  For contact information, see Amion. If no response to pager, please call PCCM consult pager. After hours, 7PM- 7AM, please call Elink.

## 2023-07-13 ENCOUNTER — Institutional Professional Consult (permissible substitution)
Admission: AD | Admit: 2023-07-13 | Discharge: 2023-08-13 | Disposition: A | Discharge status: Still In Hospital | Payer: Medicare Other | Source: Other Acute Inpatient Hospital | Attending: Internal Medicine | Admitting: Internal Medicine

## 2023-07-13 ENCOUNTER — Institutional Professional Consult (permissible substitution) (HOSPITAL_COMMUNITY): Payer: Medicare Other

## 2023-07-13 ENCOUNTER — Inpatient Hospital Stay (HOSPITAL_COMMUNITY): Payer: Medicare Other

## 2023-07-13 DIAGNOSIS — J9601 Acute respiratory failure with hypoxia: Secondary | ICD-10-CM | POA: Diagnosis not present

## 2023-07-13 LAB — CBC
HCT: 26.9 % — ABNORMAL LOW (ref 39.0–52.0)
Hemoglobin: 8.3 g/dL — ABNORMAL LOW (ref 13.0–17.0)
MCH: 31.6 pg (ref 26.0–34.0)
MCHC: 30.9 g/dL (ref 30.0–36.0)
MCV: 102.3 fL — ABNORMAL HIGH (ref 80.0–100.0)
Platelets: 156 10*3/uL (ref 150–400)
RBC: 2.63 MIL/uL — ABNORMAL LOW (ref 4.22–5.81)
RDW: 16.3 % — ABNORMAL HIGH (ref 11.5–15.5)
WBC: 8.6 10*3/uL (ref 4.0–10.5)
nRBC: 0 % (ref 0.0–0.2)

## 2023-07-13 LAB — RENAL FUNCTION PANEL
Albumin: 1.9 g/dL — ABNORMAL LOW (ref 3.5–5.0)
Anion gap: 9 (ref 5–15)
BUN: 50 mg/dL — ABNORMAL HIGH (ref 8–23)
CO2: 31 mmol/L (ref 22–32)
Calcium: 8.6 mg/dL — ABNORMAL LOW (ref 8.9–10.3)
Chloride: 104 mmol/L (ref 98–111)
Creatinine, Ser: 0.88 mg/dL (ref 0.61–1.24)
GFR, Estimated: >60 mL/min (ref >60)
Glucose, Bld: 125 mg/dL — ABNORMAL HIGH (ref 70–99)
Phosphorus: 4 mg/dL (ref 2.5–4.6)
Potassium: 3.5 mmol/L (ref 3.5–5.1)
Sodium: 144 mmol/L (ref 135–145)

## 2023-07-13 LAB — GLUCOSE, CAPILLARY
Glucose-Capillary: 112 mg/dL — ABNORMAL HIGH (ref 70–99)
Glucose-Capillary: 152 mg/dL — ABNORMAL HIGH (ref 70–99)
Glucose-Capillary: 112 mg/dL — ABNORMAL HIGH (ref 70–99)
Glucose-Capillary: 134 mg/dL — ABNORMAL HIGH (ref 70–99)

## 2023-07-13 MED ORDER — FUROSEMIDE 20 MG PO TABS: 40.0000 mg | ORAL_TABLET | Freq: Every day | ORAL | Status: DC

## 2023-07-13 MED ORDER — QUETIAPINE FUMARATE 50 MG PO TABS: 25.0000 mg | ORAL_TABLET | Freq: Every morning | ORAL | Status: DC

## 2023-07-13 MED ORDER — JUVEN PO PACK: 1.0000 | PACK | Freq: Two times a day (BID) | ORAL | Status: DC

## 2023-07-13 MED ORDER — VANCOMYCIN HCL 1250 MG/250ML IV SOLN
1250.0000 mg | INTRAVENOUS | Status: DC
  Administered 2023-07-13: 1250 mg via INTRAVENOUS
  Filled 2023-07-13: qty 250

## 2023-07-13 MED ORDER — POTASSIUM CHLORIDE 20 MEQ PO PACK
40.0000 meq | PACK | Freq: Once | ORAL | Status: AC
  Administered 2023-07-13: 40 meq
  Filled 2023-07-13: qty 2

## 2023-07-13 MED ORDER — CHLORHEXIDINE GLUCONATE CLOTH 2 % EX PADS: 6.0000 | MEDICATED_PAD | Freq: Every day | CUTANEOUS | Status: DC

## 2023-07-13 MED ORDER — INSULIN ASPART 100 UNIT/ML IJ SOLN: 0.0000 [IU] | INTRAMUSCULAR | Status: DC

## 2023-07-13 MED ORDER — CARMEX CLASSIC LIP BALM EX OINT: TOPICAL_OINTMENT | CUTANEOUS | Status: DC | PRN

## 2023-07-13 MED ORDER — DOCUSATE SODIUM 50 MG/5ML PO LIQD: 100.0000 mg | Freq: Two times a day (BID) | ORAL | Status: DC

## 2023-07-13 MED ORDER — DIATRIZOATE MEGLUMINE & SODIUM 66-10 % PO SOLN
30.0000 mL | Freq: Once | ORAL | Status: AC
  Administered 2023-07-13: 30 mL

## 2023-07-13 MED ORDER — ORAL CARE MOUTH RINSE: 15.0000 mL | OROMUCOSAL | Status: DC | PRN

## 2023-07-13 MED ORDER — QUETIAPINE FUMARATE 100 MG PO TABS: 100.0000 mg | ORAL_TABLET | Freq: Every day | ORAL | Status: DC

## 2023-07-13 MED ORDER — DEXMEDETOMIDINE HCL IN NACL 400 MCG/100ML IV SOLN: 0.0000 ug/kg/h | INTRAVENOUS | Status: DC

## 2023-07-13 MED ORDER — JEVITY 1.5 CAL/FIBER PO LIQD: 1000.0000 mL | ORAL | Status: DC

## 2023-07-13 MED ORDER — ADULT MULTIVITAMIN W/MINERALS CH: 1.0000 | ORAL_TABLET | Freq: Every day | ORAL | Status: DC

## 2023-07-13 MED ORDER — POLYETHYLENE GLYCOL 3350 17 G PO PACK: 17.0000 g | PACK | Freq: Every day | ORAL | Status: DC

## 2023-07-13 MED ORDER — SERTRALINE HCL 100 MG PO TABS: 100.0000 mg | ORAL_TABLET | Freq: Every day | ORAL | Status: DC

## 2023-07-13 MED ORDER — POLYETHYLENE GLYCOL 3350 17 G PO PACK: 17.0000 g | PACK | Freq: Every day | ORAL | Status: DC | PRN

## 2023-07-13 MED ORDER — VANCOMYCIN HCL 1250 MG/250ML IV SOLN: 1250.0000 mg | INTRAVENOUS | Status: DC

## 2023-07-13 MED ORDER — PROSOURCE TF20 ENFIT COMPATIBL EN LIQD: 60.0000 mL | Freq: Every day | ENTERAL | Status: DC

## 2023-07-13 MED ORDER — CLONAZEPAM 0.5 MG PO TABS: 0.5000 mg | ORAL_TABLET | Freq: Two times a day (BID) | ORAL | Status: DC

## 2023-07-13 MED ORDER — POTASSIUM CHLORIDE CRYS ER 20 MEQ PO TBCR: 20.0000 meq | EXTENDED_RELEASE_TABLET | Freq: Every day | ORAL | Status: DC

## 2023-07-13 MED ORDER — SODIUM CHLORIDE 3 % IN NEBU: 4.0000 mL | INHALATION_SOLUTION | Freq: Two times a day (BID) | RESPIRATORY_TRACT | Status: DC

## 2023-07-13 MED ORDER — ORAL CARE MOUTH RINSE: 15.0000 mL | Freq: Four times a day (QID) | OROMUCOSAL | Status: DC

## 2023-07-13 MED ORDER — FENTANYL CITRATE PF 50 MCG/ML IJ SOSY: 12.5000 ug | PREFILLED_SYRINGE | INTRAMUSCULAR | Status: DC | PRN

## 2023-07-13 MED ORDER — FAMOTIDINE 20 MG PO TABS: 20.0000 mg | ORAL_TABLET | Freq: Two times a day (BID) | ORAL | Status: DC

## 2023-07-13 MED ORDER — AMIODARONE HCL 200 MG PO TABS: 200.0000 mg | ORAL_TABLET | Freq: Every day | ORAL | Status: DC

## 2023-07-13 NOTE — Progress Notes (Deleted)
Resident Discharge Summary  Patient ID: Joshua Eaton MRN: [id number] DOB/AGE: June 20, 1956 67 y.o.  Admit date: 07/01/2023 Discharge date: 07/13/2023  Problem List Principal Problem:   Respiratory failure [facility]) Active Problems:   Free intraperitoneal air   Other pulmonary embolism without acute cor pulmonale (HCC)   Pressure injury of skin   Malnutrition of moderate degree   Staphylococcal pneumonia (HCC)   Acute metabolic encephalopathy  HPI: Joshua Eaton is a 67 y/o gentleman with a history of respiratory failure and tracheostomy with recent admission from 10/3-10/28 at [facility] after a 2 week admission at [facility] in September for pneumonia complicated by ARDS and acute cardiogenic pulmonary edema. He has severe MR recently diagnosed and is not a candidate for valve replacement due to overall frailty. He underwent tracheostomy and PEG placement during his admission and [facility]. Later that day he had an accidental tracheostomy dislodgement, and his trach stayed out. Yesterday he developed increased work of breathing and was transferred to the ED for further evaluation. He was trialed on BiPAP but did not tolerate it well and eventually required reintubation in the ED. He underwent CT scan demonstrating possible contained bowel perforation; Ct with enteral contrast has now been ordered and surgery has evaluated him. PCCM was consulted for admission.   Hospital Course:  Initially hospitalized on 06/03/2023 with acute respiratory failure and required tracheostomy done on 06/06/2023.  He was found to have severe pulmonary edema due to severe mitral regurgitation and mitral valve perforation.  Unfortunately during admission and on discharge he is not a surgical candidate and this has been discussed with our cardiothoracic surgeons as well as [facility].  During last admission his tracheostomy was revised on 10/18 due to a cuff leak and PEG was placed on 10/24.  [facility] but unfortunately while there is trach tube was dislodged and then left out.  He was readmitted on 07/01/2023 for respiratory failure and questionable contained bowel perforation.  Further workup for his possible bowel perforation showed no acute abnormalities.  He was also found to have diffuse airspace disease on CT of the chest and was treated for ventilator associated pneumonia.  Tracheostomy was advised on 07/05/2023.  After this date he and progressively improving spontaneous breathing trials but frequently had to be placed back on the ventilator and injury thick sputum that was cultured and positive for MRSA. Treated with 7 days of vancomycin to finish on 11/17. At this point he is stable for transfer to [facility] for vent weaning.   Problem based plans below.   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; preserved LV function Moderate AI -diuresis for goal euvolemia -lasix 60 mg daily and 20 mEq Kcl daily -needs significant rehabilitation before he would be a candidate for valve replacement; previously consulted [facility] during last admission   Anxiety -con't zoloft   Afib, paroxysmal -con't amiodarone, eliquis   RUE provoked DVT last admission due to PICC line -apixaban    moderate protein energy  malnutrition -con't TF via peg   Hyperglycemia -SSI PRN -goal BG 140-180   Hypertension -hydralazine q8h with holding parameters    Urinary retention, BPH -unfortunately still needs foley -if  able to take pills, restart tamsulosin -foley trial as able   Previous AKI   Severe deconditioning   Perineal moisture associated skin breakdown  Labs at discharge Lab Results  Component Value Date   CREATININE 0.88 07/13/2023   BUN 50 (H) 07/13/2023   NA 144 07/13/2023   K 3.5 07/13/2023   CL 104 07/13/2023   CO2 31 07/13/2023   Lab Results  Component Value Date   WBC 8.6 07/13/2023   HGB 8.3 (L) 07/13/2023   HCT 26.9 (L) 07/13/2023   MCV 102.3 (H) 07/13/2023   PLT 156 07/13/2023   Lab Results  Component Value Date   ALT 37 07/01/2023   AST 49 (H) 07/01/2023   ALKPHOS 74 07/01/2023   BILITOT 1.7 (H) 07/01/2023   Lab Results  Component Value Date   INR 1.9 (H) 07/01/2023   INR 1.1 06/19/2023    Current radiology studies No results found.  Disposition:   Discharge disposition: 63-Long Term Care       Discharge Instructions     Discharge wound care:   Complete by: As directed    Wound care per accepting facility      Allergies as of 07/13/2023       Reactions   Codeine    "sends me into orbit"        Medication List     STOP taking these medications    ACIDOPHILUS LACTOBACILLUS PO   aspirin 81 MG chewable tablet   ceFEPIme 2 g in sodium chloride 0.9 % 100 mL   feeding supplement Liqd Replaced by: nutrition supplement (JUVEN) Pack   free water Soln   furosemide 10 MG/ML injection Commonly known as: LASIX Replaced by: furosemide 20 MG tablet   HYDROcodone-acetaminophen 5-325 MG tablet Commonly known as: NORCO/VICODIN   insulin lispro 100 UNIT/ML injection Commonly known as: HUMALOG   loperamide 2 MG capsule Commonly known as: IMODIUM   LORazepam 0.5 MG tablet Commonly known as: ATIVAN   methylPREDNISolone sodium succinate 40 mg/mL injection Commonly known as: SOLU-MEDROL   metoprolol tartrate 25 MG tablet Commonly known as: LOPRESSOR   metroNIDAZOLE 500 MG tablet Commonly known as: FLAGYL   metroNIDAZOLE 500  MG/100ML Commonly known as: FLAGYL   Morphine Sulfate (PF) 2 MG/ML Soln   pantoprazole 2 mg/mL suspension Commonly known as: PROTONIX       TAKE these medications    acetaminophen 325 MG tablet Commonly known as: TYLENOL Take 650 mg by mouth every 6 (six) hours as needed for moderate pain.   amiodarone 200 MG tablet Commonly known as: PACERONE Place 1 tablet (200 mg total) into feeding tube daily. Start taking on: July 14, 2023 What changed: See the new instructions.   apixaban 5 MG Tabs tablet Commonly known as: ELIQUIS Place 1 tablet (5 mg total) into feeding tube 2 (two) times daily.   Chlorhexidine Gluconate Cloth 2 % Pads Apply 6 each topically daily. Start taking on: July 14, 2023   clonazePAM 0.5 MG tablet Commonly known as: KLONOPIN Place 1 tablet (0.5 mg total) into feeding tube 2 (two) times daily.   dexmedetomidine 400 MCG/100ML Soln Commonly known as: PRECEDEX Inject 0-114.72 mcg/hr into the vein continuous.   dextrose 40 % Gel Commonly known as: GLUTOSE Take 15 g by mouth daily as needed  for low blood sugar (70mg /dL).   DEXTROSE 50%/ELECTROLYTES IV Inject 25 g into the vein daily as needed (blood sugar 50mg /dL).   docusate 50 MG/5ML liquid Commonly known as: COLACE Place 10 mLs (100 mg total) into feeding tube 2 (two) times daily.   famotidine 20 MG tablet Commonly known as: PEPCID Place 1 tablet (20 mg total) into feeding tube 2 (two) times daily.   feeding supplement (PROSource TF20) liquid Place 60 mLs into feeding tube daily. Start taking on: July 14, 2023   fentaNYL 50 MCG/ML injection Commonly known as: SUBLIMAZE Inject 0.25-0.5 mLs (12.5-25 mcg total) into the vein every hour as needed for moderate pain (pain score 4-6) (severe vent dysscynchrony).   furosemide 20 MG tablet Commonly known as: Lasix Place 2 tablets (40 mg total) into feeding tube daily. Replaces: furosemide 10 MG/ML injection   Glucagon HCl 1 MG  Solr Inject 1 mg as directed once as needed (hypoglycemia).   hydrALAZINE 10 MG tablet Commonly known as: APRESOLINE Place 10 mg into feeding tube every 8 (eight) hours.   insulin aspart 100 UNIT/ML injection Commonly known as: novoLOG Inject 0-15 Units into the skin every 4 (four) hours.   ipratropium-albuterol 0.5-2.5 (3) MG/3ML Soln Commonly known as: DUONEB Take 3 mLs by nebulization every 4 (four) hours as needed (sob/wheezing).   lip balm ointment Apply topically as needed.   magnesium oxide 400 (240 Mg) MG tablet Commonly known as: MAG-OX Take 400 mg by mouth every 6 (six) hours as needed (magnesium electrolyte replacement).   mouth rinse Liqd solution 15 mLs by Mouth Rinse route as needed (oral care).   mouth rinse Liqd solution 15 mLs by Mouth Rinse route 4 (four) times daily.   multivitamin with minerals Tabs tablet Place 1 tablet into feeding tube daily. Start taking on: July 14, 2023   nutrition supplement (JUVEN) Pack Place 1 packet into feeding tube 2 (two) times daily between meals. Replaces: feeding supplement Liqd   feeding supplement (JEVITY 1.5 CAL/FIBER) Liqd Place 1,000 mLs into feeding tube continuous.   polyethylene glycol 17 g packet Commonly known as: MIRALAX / GLYCOLAX Place 17 g into feeding tube daily as needed for moderate constipation.   polyethylene glycol 17 g packet Commonly known as: MIRALAX / GLYCOLAX Place 17 g into feeding tube daily. Start taking on: July 14, 2023   potassium chloride SA 20 MEQ tablet Commonly known as: KLOR-CON M Take 1 tablet (20 mEq total) by mouth daily. Or every 4 hours as needed for potassium electrolyte replacement. What changed:  how much to take when to take this   QUEtiapine 100 MG tablet Commonly known as: SEROQUEL Place 1 tablet (100 mg total) into feeding tube at bedtime. What changed:  medication strength how much to take when to take this   QUEtiapine 50 MG tablet Commonly  known as: SEROQUEL Place 0.5 tablets (25 mg total) into feeding tube every morning. Start taking on: July 14, 2023 What changed: You were already taking a medication with the same name, and this prescription was added. Make sure you understand how and when to take each.   sertraline 100 MG tablet Commonly known as: ZOLOFT Place 1 tablet (100 mg total) into feeding tube daily. Start taking on: July 14, 2023   simethicone 80 MG chewable tablet Commonly known as: MYLICON Place 80 mg into feeding tube 4 (four) times daily.   sodium chloride HYPERTONIC 3 % nebulizer solution Take 4 mLs by nebulization 2 (two) times daily.  vancomycin 1250 MG/250ML Soln Commonly known as: VANCOREADY Inject 250 mLs (1,250 mg total) into the vein daily.               Discharge Care Instructions  (From admission, onward)           Start     Ordered   07/13/23 0000  Discharge wound care:       Comments: Wound care per accepting facility   07/13/23 1252              Discharged Condition: stable  Time spent on discharge greater than 40 minutes.  Vital signs at Discharge. Temp:  [98.7 F (37.1 C)-99.2 F (37.3 C)] 99.2 F (37.3 C) (11/12 1134) Pulse Rate:  [52-96] 77 (11/12 1130) Resp:  [14-38] 30 (11/12 1130) BP: (95-151)/(46-102) 135/102 (11/12 1130) SpO2:  [94 %-100 %] 96 % (11/12 1130) FiO2 (%):  [35 %-40 %] 40 % (11/12 1111) General: chronically ill appearing male, trach in place HEENT: [place]/AT, moist mucous membranes, sclera anicteric Neuro: awake, following commands CV: rrr, s1s2, no murmurs PULM: course breath sounds GI: soft, non-tender, non-distended, BS+ Extremities: warm, + edema Skin: no rashes  Office follow up Special Information or instructions. Signed: Rocky Morel, DO Internal Medicine, PGY-2 07/13/2023, 12:52 PM See Amion for pager If no response to pager, please call [phone] until 1900 After 1900 please call [facility] [phone]

## 2023-07-13 NOTE — Progress Notes (Signed)
RT NOTE: patient placed on 40% ATC.  Tolerating well at this time.  Will continue to monitor.

## 2023-07-13 NOTE — TOC Transition Note (Signed)
Transition of Care [facility]) - CM/SW Discharge Note   Patient Details  Name: Joshua Eaton MRN: [id number] Date of Birth: Oct 10, 1955  Transition of Care [facility]) CM/SW Contact:  Lorri Frederick, LCSW Phone Number: 07/13/2023, 2:54 PM   Clinical Narrative:   CSW informed by [facility] that they do have insurance approval and available bed for transfer today.  DC summary already complete.  CSW spoke with pt wife [name], who is here at the hospital and aware of plan to transfer to [facility] today.     Final next level of care: Long Term Acute Care (LTAC) Barriers to Discharge: Barriers Resolved   Patient Goals and CMS Choice      Discharge Placement                Patient chooses bed at:  ([facility]) Patient to be transferred to facility by: in house transfer Name of family member notified: wife [name] Patient and family notified of of transfer: 07/13/23  Discharge Plan and Services Additional resources added to the After Visit Summary for                                       Social Determinants of Health (SDOH) Interventions SDOH Screenings   Food Insecurity: Patient Unable To Answer (06/29/2023)   Received from [facility]  Transportation Needs: Patient Unable To Answer (06/29/2023)   Received from [facility]  Utilities: Patient Unable To Answer (06/29/2023)   Received from [facility]  Financial Resource Strain: Patient Unable To Answer (06/29/2023)   Received from [facility]  Social Connections: Patient Unable To Answer (06/29/2023)   Received from [facility]  Stress: Patient Unable To Answer (06/29/2023)   Received from [facility]  Tobacco Use: Low Risk  (07/01/2023)     Readmission Risk Interventions     No data to display

## 2023-07-13 NOTE — Evaluation (Signed)
[facility] Speaking Valve - Evaluation Patient Details  Name: Joshua Eaton MRN: [id number] Date of Birth: [birth year]  Today's Date: 07/13/2023 Time: 4098-1191 SLP Time Calculation (min) (ACUTE ONLY): 20 min  Past Medical History:  Past Medical History:  Diagnosis Date   Anxiety    Cancer (HCC)    Depression    Hypertension    OCD (obsessive compulsive disorder)    Past Surgical History:  Past Surgical History:  Procedure Laterality Date   CHOLECYSTECTOMY     COLON SURGERY     cancerous polyp removed   HERNIA REPAIR     IR GASTROSTOMY TUBE MOD SED  06/24/2023   HPI:  This is a 67 year old male with a past medical history of colon cancer status post right hemicolectomy 2007, COPD, mitral valve Regurgitation who presented to [facility] on 05/20/2023 for shortness of breath. Prior to presenting, patient was feeling unwell for about 6 days with cough, shortness of breath. He had an initial CXR showing bilateral airspace opacities and was eventually placed on BiPAP and admitted to ICU. On 9/20 he was intubated due to ongoing respiratory failure and antibiotics were widened for multifocal pneumonia. He had echo performed showing EF 60-65%, severe dilated LA, severe MR. Throughout his course at [facility] had ongoing ARDS picture, developed worsening AKI. Eventually, [facility] PCCM team was consulted for transfer here due to ongoing high vent requirements, unable to wean. S/p tracheostomy 10/6. Had cuff leak 10/18 and reintubated for trach revision. CXR 10/28: "Progression of bibasilar airspace opacities concerning for pneumonia." He was discharged to [facility] on 06/28/23, trach was dislodged and left out. He was readmitted to [facility] on 10/31 for respiratory failure. Imaging concerning for pneumatosis. Trach revision on 11/4. Requiring ventilator support vs trach collar trials intermittently. Hospitalization complicated by ICU delirium.    Assessment / Plan / Recommendation  Clinical Impression   Patient tolerated cuff deflation without change in RR (33-37)or SpO2(90-92%). Prior to any intervention from SLP, he appeared fidgety and with shallow, fast breathing. Cuff deflation did not result in significant change in vitals. Patient tolerated PMV well for total of approximately 10 minutes with SLP removing periodically to check for back pressure which would indicate CO2 retention. No back pressure observed when PMV doffed. Patient was able to achieve strong, clear voicing and speak at phrase level, however he required cues to initiate any attempts to communicate. Spontaneous verbalizations were inconsistent and mainly consisted of patient indicating, "time to go home". Although SpO2 did not change with PMV donned, RR increased to as high as 51, with average being 47. SLP is recommending PMV trials with SLP only. SLP Visit Diagnosis: Aphonia (R49.1)    SLP Assessment  All further Speech Lanaguage Pathology  needs can be addressed in the next venue of care    Recommendations for follow up therapy are one component of a multi-disciplinary discharge planning process, led by the attending physician.  Recommendations may be updated based on patient status, additional functional criteria and insurance authorization.  Follow Up Recommendations  SLP at Long-term acute care hospital    Assistance Recommended at Discharge Frequent or constant Supervision/Assistance  Functional Status Assessment Patient has had a recent decline in their functional status and demonstrates the ability to make significant improvements in function in a reasonable and predictable amount of time.  Frequency and Duration         PMSV Trial PMSV was placed for: total of 10 minutes Able to redirect subglottic air through upper airway: Yes Able  to Attain Phonation: Yes Voice Quality: Normal Able to Expectorate Secretions: No Level of Secretion Expectoration with PMSV: Not observed Breath Support for Phonation:  Adequate Intelligibility: Intelligible Word: 75-100% accurate Phrase: 75-100% accurate Respirations During Trial: (!) 47 SpO2 During Trial: 90 % Behavior: Alert;Anxious;Confused   Tracheostomy Tube       Vent Dependency  Vent Dependent: No FiO2 (%): 40 %    Cuff Deflation Trial Tolerated Cuff Deflation: Yes Length of Time for Cuff Deflation Trial: 25 minutes (left deflated when SLP left and Respiratory therapy arrived Behavior: Alert;Restless;Quiet;Confused;Anxious         Angela Nevin, MA, CCC-SLP Speech Therapy

## 2023-07-13 NOTE — Progress Notes (Signed)
Pt's wife at bedside and aware of pt's transfer to 5E. Pt's V/S/S.

## 2023-07-13 NOTE — Progress Notes (Signed)
Pharmacy Antibiotic Note  Joshua Eaton is a 67 y.o. male admitted on 07/01/2023 with pneumonia (VAP).  Pharmacy has been consulted for Vancomycin and Zosyn dosing. Trach aspirate culture show patient has MRSA pneumonia.  -sCr 0.7-0.9, WBC trending down, afebrile  -VP 41 (drawn 07/12/2023 @1517 ), VT 32 (drawn 07/12/2023 @2329 ), Dose given 07/12/2023 @1236  for 1.5 hours  Plan: Change vancomycin dose to 1250mg  IV every 24 hours (calculated AUC 437, Cmin 12.7)  Height: 6\' 1"  (185.4 cm) Weight: 92.8 kg (204 lb 9.4 oz) IBW/kg (Calculated) : 79.9  Temp (24hrs), Avg:98.8 F (37.1 C), Min:98.6 F (37 C), Max:99.2 F (37.3 C)  Recent Labs  Lab 07/06/23 0339 07/07/23 0303 07/08/23 0318 07/09/23 0257 07/09/23 0822 07/11/23 0236 07/12/23 1108 07/12/23 1517 07/12/23 2329  WBC 7.1 9.3 10.8*  --  10.1 8.4  --   --   --   CREATININE 1.04 0.89 0.88 0.87  --  0.79 0.76  --   --   VANCOTROUGH  --   --   --   --   --   --   --   --  32*  VANCOPEAK  --   --   --   --   --   --   --  41*  --     Estimated Creatinine Clearance: 98.5 mL/min (by C-G formula based on SCr of 0.76 mg/dL).    Allergies  Allergen Reactions   Codeine     "sends me into orbit"    Antimicrobials this admission: Zosyn 10/31>11/6 Vanc 11/2 >>  Microbiology results: 10/31 BCX: ng 11/1 BCX: ng 11/1 UCX: ng 11/2, 11/9 MRSA PCR negative 11/8 resp / TA: MRSA  Thank you for allowing pharmacy to be a part of this patient's care.  Arabella Merles, PharmD. Clinical Pharmacist 07/13/2023 12:37 AM

## 2023-07-13 NOTE — Discharge Summary (Signed)
Resident Discharge Summary  Patient ID: Joshua Eaton MRN: [id number] DOB/AGE: June 20, 1956 67 y.o.  Admit date: 07/01/2023 Discharge date: 07/13/2023  Problem List Principal Problem:   Respiratory failure [facility]) Active Problems:   Free intraperitoneal air   Other pulmonary embolism without acute cor pulmonale (HCC)   Pressure injury of skin   Malnutrition of moderate degree   Staphylococcal pneumonia (HCC)   Acute metabolic encephalopathy  HPI: Mr. Joshua Eaton is a 67 y/o gentleman with a history of respiratory failure and tracheostomy with recent admission from 10/3-10/28 at [facility] after a 2 week admission at [facility] in September for pneumonia complicated by ARDS and acute cardiogenic pulmonary edema. He has severe MR recently diagnosed and is not a candidate for valve replacement due to overall frailty. He underwent tracheostomy and PEG placement during his admission and [facility]. Later that day he had an accidental tracheostomy dislodgement, and his trach stayed out. Yesterday he developed increased work of breathing and was transferred to the ED for further evaluation. He was trialed on BiPAP but did not tolerate it well and eventually required reintubation in the ED. He underwent CT scan demonstrating possible contained bowel perforation; Ct with enteral contrast has now been ordered and surgery has evaluated him. PCCM was consulted for admission.   Hospital Course:  Initially hospitalized on 06/03/2023 with acute respiratory failure and required tracheostomy done on 06/06/2023.  He was found to have severe pulmonary edema due to severe mitral regurgitation and mitral valve perforation.  Unfortunately during admission and on discharge he is not a surgical candidate and this has been discussed with our cardiothoracic surgeons as well as [facility].  During last admission his tracheostomy was revised on 10/18 due to a cuff leak and PEG was placed on 10/24.  [facility] but unfortunately while there is trach tube was dislodged and then left out.  He was readmitted on 07/01/2023 for respiratory failure and questionable contained bowel perforation.  Further workup for his possible bowel perforation showed no acute abnormalities.  He was also found to have diffuse airspace disease on CT of the chest and was treated for ventilator associated pneumonia.  Tracheostomy was advised on 07/05/2023.  After this date he and progressively improving spontaneous breathing trials but frequently had to be placed back on the ventilator and injury thick sputum that was cultured and positive for MRSA. Treated with 7 days of vancomycin to finish on 11/17. At this point he is stable for transfer to [facility] for vent weaning.   Problem based plans below.   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; preserved LV function Moderate AI -diuresis for goal euvolemia -lasix 60 mg daily and 20 mEq Kcl daily -needs significant rehabilitation before he would be a candidate for valve replacement; previously consulted TCTS during last admission   Anxiety -con't zoloft   Afib, paroxysmal -con't amiodarone, eliquis   RUE provoked DVT last admission due to PICC line -apixaban    moderate protein energy  malnutrition -con't TF via peg   Hyperglycemia -SSI PRN -goal BG 140-180   Hypertension -hydralazine q8h with holding parameters    Urinary retention, BPH -unfortunately still needs foley -if  able to take pills, restart tamsulosin -foley trial as able   Previous AKI   Severe deconditioning   Perineal moisture associated skin breakdown  Labs at discharge Lab Results  Component Value Date   CREATININE 0.88 07/13/2023   BUN 50 (H) 07/13/2023   NA 144 07/13/2023   K 3.5 07/13/2023   CL 104 07/13/2023   CO2 31 07/13/2023   Lab Results  Component Value Date   WBC 8.6 07/13/2023   HGB 8.3 (L) 07/13/2023   HCT 26.9 (L) 07/13/2023   MCV 102.3 (H) 07/13/2023   PLT 156 07/13/2023   Lab Results  Component Value Date   ALT 37 07/01/2023   AST 49 (H) 07/01/2023   ALKPHOS 74 07/01/2023   BILITOT 1.7 (H) 07/01/2023   Lab Results  Component Value Date   INR 1.9 (H) 07/01/2023   INR 1.1 06/19/2023    Current radiology studies No results found.  Disposition:   Discharge disposition: [facility]-Long Term Care       Discharge Instructions     Discharge wound care:   Complete by: As directed    Wound care per accepting facility      Allergies as of 07/13/2023       Reactions   Codeine    "sends me into orbit"        Medication List     STOP taking these medications    ACIDOPHILUS LACTOBACILLUS PO   aspirin 81 MG chewable tablet   ceFEPIme 2 g in sodium chloride 0.9 % 100 mL   feeding supplement Liqd Replaced by: nutrition supplement (JUVEN) Pack   free water Soln   furosemide 10 MG/ML injection Commonly known as: LASIX Replaced by: furosemide 20 MG tablet   HYDROcodone-acetaminophen 5-325 MG tablet Commonly known as: NORCO/VICODIN   insulin lispro 100 UNIT/ML injection Commonly known as: HUMALOG   loperamide 2 MG capsule Commonly known as: IMODIUM   LORazepam 0.5 MG tablet Commonly known as: ATIVAN   methylPREDNISolone sodium succinate 40 mg/mL injection Commonly known as: SOLU-MEDROL   metoprolol tartrate 25 MG tablet Commonly known as: LOPRESSOR   metroNIDAZOLE 500 MG tablet Commonly known as: FLAGYL   metroNIDAZOLE 500  MG/100ML Commonly known as: FLAGYL   Morphine Sulfate (PF) 2 MG/ML Soln   pantoprazole 2 mg/mL suspension Commonly known as: PROTONIX       TAKE these medications    acetaminophen 325 MG tablet Commonly known as: TYLENOL Take 650 mg by mouth every 6 (six) hours as needed for moderate pain.   amiodarone 200 MG tablet Commonly known as: PACERONE Place 1 tablet (200 mg total) into feeding tube daily. Start taking on: July 14, 2023 What changed: See the new instructions.   apixaban 5 MG Tabs tablet Commonly known as: ELIQUIS Place 1 tablet (5 mg total) into feeding tube 2 (two) times daily.   Chlorhexidine Gluconate Cloth 2 % Pads Apply 6 each topically daily. Start taking on: July 14, 2023   clonazePAM 0.5 MG tablet Commonly known as: KLONOPIN Place 1 tablet (0.5 mg total) into feeding tube 2 (two) times daily.   dexmedetomidine 400 MCG/100ML Soln Commonly known as: PRECEDEX Inject 0-114.72 mcg/hr into the vein continuous.   dextrose 40 % Gel Commonly known as: GLUTOSE Take 15 g by mouth daily as needed  for low blood sugar (70mg /dL).   DEXTROSE 50%/ELECTROLYTES IV Inject 25 g into the vein daily as needed (blood sugar 50mg /dL).   docusate 50 MG/5ML liquid Commonly known as: COLACE Place 10 mLs (100 mg total) into feeding tube 2 (two) times daily.   famotidine 20 MG tablet Commonly known as: PEPCID Place 1 tablet (20 mg total) into feeding tube 2 (two) times daily.   feeding supplement (PROSource TF20) liquid Place 60 mLs into feeding tube daily. Start taking on: July 14, 2023   fentaNYL 50 MCG/ML injection Commonly known as: SUBLIMAZE Inject 0.25-0.5 mLs (12.5-25 mcg total) into the vein every hour as needed for moderate pain (pain score 4-6) (severe vent dysscynchrony).   furosemide 20 MG tablet Commonly known as: Lasix Place 2 tablets (40 mg total) into feeding tube daily. Replaces: furosemide 10 MG/ML injection   Glucagon HCl 1 MG  Solr Inject 1 mg as directed once as needed (hypoglycemia).   hydrALAZINE 10 MG tablet Commonly known as: APRESOLINE Place 10 mg into feeding tube every 8 (eight) hours.   insulin aspart 100 UNIT/ML injection Commonly known as: novoLOG Inject 0-15 Units into the skin every 4 (four) hours.   ipratropium-albuterol 0.5-2.5 (3) MG/3ML Soln Commonly known as: DUONEB Take 3 mLs by nebulization every 4 (four) hours as needed (sob/wheezing).   lip balm ointment Apply topically as needed.   magnesium oxide 400 (240 Mg) MG tablet Commonly known as: MAG-OX Take 400 mg by mouth every 6 (six) hours as needed (magnesium electrolyte replacement).   mouth rinse Liqd solution 15 mLs by Mouth Rinse route as needed (oral care).   mouth rinse Liqd solution 15 mLs by Mouth Rinse route 4 (four) times daily.   multivitamin with minerals Tabs tablet Place 1 tablet into feeding tube daily. Start taking on: July 14, 2023   nutrition supplement (JUVEN) Pack Place 1 packet into feeding tube 2 (two) times daily between meals. Replaces: feeding supplement Liqd   feeding supplement (JEVITY 1.5 CAL/FIBER) Liqd Place 1,000 mLs into feeding tube continuous.   polyethylene glycol 17 g packet Commonly known as: MIRALAX / GLYCOLAX Place 17 g into feeding tube daily as needed for moderate constipation.   polyethylene glycol 17 g packet Commonly known as: MIRALAX / GLYCOLAX Place 17 g into feeding tube daily. Start taking on: July 14, 2023   potassium chloride SA 20 MEQ tablet Commonly known as: KLOR-CON M Take 1 tablet (20 mEq total) by mouth daily. Or every 4 hours as needed for potassium electrolyte replacement. What changed:  how much to take when to take this   QUEtiapine 100 MG tablet Commonly known as: SEROQUEL Place 1 tablet (100 mg total) into feeding tube at bedtime. What changed:  medication strength how much to take when to take this   QUEtiapine 50 MG tablet Commonly  known as: SEROQUEL Place 0.5 tablets (25 mg total) into feeding tube every morning. Start taking on: July 14, 2023 What changed: You were already taking a medication with the same name, and this prescription was added. Make sure you understand how and when to take each.   sertraline 100 MG tablet Commonly known as: ZOLOFT Place 1 tablet (100 mg total) into feeding tube daily. Start taking on: July 14, 2023   simethicone 80 MG chewable tablet Commonly known as: MYLICON Place 80 mg into feeding tube 4 (four) times daily.   sodium chloride HYPERTONIC 3 % nebulizer solution Take 4 mLs by nebulization 2 (two) times daily.  vancomycin 1250 MG/250ML Soln Commonly known as: VANCOREADY Inject 250 mLs (1,250 mg total) into the vein daily.               Discharge Care Instructions  (From admission, onward)           Start     Ordered   07/13/23 0000  Discharge wound care:       Comments: Wound care per accepting facility   07/13/23 1252              Discharged Condition: stable  Time spent on discharge greater than 40 minutes.  Vital signs at Discharge. Temp:  [98.7 F (37.1 C)-99.2 F (37.3 C)] 99.2 F (37.3 C) (11/12 1134) Pulse Rate:  [52-96] 77 (11/12 1130) Resp:  [14-38] 30 (11/12 1130) BP: (95-151)/(46-102) 135/102 (11/12 1130) SpO2:  [94 %-100 %] 96 % (11/12 1130) FiO2 (%):  [35 %-40 %] 40 % (11/12 1111) General: chronically ill appearing male, trach in place HEENT: [place]/AT, moist mucous membranes, sclera anicteric Neuro: awake, following commands CV: rrr, s1s2, no murmurs PULM: course breath sounds GI: soft, non-tender, non-distended, BS+ Extremities: warm, + edema Skin: no rashes  Office follow up Special Information or instructions. Signed: Rocky Morel, DO Internal Medicine, PGY-2 07/13/2023, 12:52 PM See Amion for pager If no response to pager, please call [phone] until 1900 After 1900 please call [facility] [phone]

## 2023-07-13 NOTE — Progress Notes (Signed)
Nutrition Follow-up  DOCUMENTATION CODES:  Non-severe (moderate) malnutrition in context of chronic illness  INTERVENTION:  Continue tube feeding via PEG: Jevity 1.5 at 65 ml/h (1560 ml per day) Prosource TF20 60 ml 1x/d Provides 2420 kcal, 120 gm protein, 1186 ml free water daily Juven BID, each packet provides 95 calories, 2.5 grams of protein (collagen), and 9.8 grams of carbohydrate (3 grams sugar); also contains 7 grams of L-arginine and L-glutamine, 300 mg vitamin C, 15 mg vitamin E, 1.2 mcg vitamin B-12, 9.5 mg zinc, 200 mg calcium, and 1.5 g  Calcium Beta-hydroxy-Beta-methylbutyrate to support wound healing MVI with minerals daily   NUTRITION DIAGNOSIS:  Moderate Malnutrition related to chronic illness (chronic respiratory failure) as evidenced by mild fat depletion, moderate muscle depletion, severe muscle depletion. - remains applicable  GOAL:  Patient will meet greater than or equal to 90% of their needs - progressing  MONITOR:  Vent status, TF tolerance  REASON FOR ASSESSMENT:  Ventilator    ASSESSMENT:  67 yo male admitted with acute on chronic respiratory failure. PMH includes tracheostomy dislodgement 10/28, PEG, HTN, a fib, DVT, mitral regurgitation with mitral valve perforation, OCD.  Pt resting in bed at the time of assessment on trach collar. Family at bedside. TF infusing at goal rate and appear to be well tolerated. Having Type 6 BM and appears to be ~ 2x/d over the last 2 days. Family has no concerns with feeds at this time. Pt more alert today than during previous admission.   Admit weight: 87.2 kg  Current weight: 92.8 kg    Intake/Output Summary (Last 24 hours) at 07/13/2023 1019 Last data filed at 07/13/2023 0800 Gross per 24 hour  Intake 1749.82 ml  Output 2125 ml  Net -375.18 ml  Net IO Since Admission: 11,644.16 mL [07/13/23 1019]  Drains/Lines: UOP: 2.3L / 24 hours  Nutritionally Relevant Medications: Scheduled Meds:  docusate  100 mg  Per Tube BID   famotidine  20 mg Per Tube BID   PROSource TF20  60 mL Per Tube Daily   furosemide  60 mg Intravenous BID   insulin aspart  0-15 Units Subcutaneous Q4H   multivitamin with minerals  1 tablet Per Tube Daily   JUVEN  1 packet Per Tube BID BM   polyethylene glycol  17 g Per Tube Daily   potassium chloride  40 mEq Per Tube Once   Continuous Infusions:  dexmedetomidine (PRECEDEX) IV infusion 0.6 mcg/kg/hr (07/13/23 0654)   feeding supplement (JEVITY 1.5 CAL/FIBER) 65 mL/hr at 07/13/23 0600   vancomycin     PRN Meds: polyethylene glycol  Labs Reviewed: BUN 50 CBG ranges from 112-152 mg/dL over the last 24 hours HgbA1c 4.1% (10/30)  NUTRITION - FOCUSED PHYSICAL EXAM: Flowsheet Row Most Recent Value  Orbital Region Mild depletion  Upper Arm Region Mild depletion  Thoracic and Lumbar Region No depletion  Buccal Region Mild depletion  Temple Region Severe depletion  Clavicle Bone Region Severe depletion  Clavicle and Acromion Bone Region Moderate depletion  Scapular Bone Region Moderate depletion  Dorsal Hand Unable to assess  Patellar Region Severe depletion  Anterior Thigh Region Severe depletion  Posterior Calf Region Severe depletion  Edema (RD Assessment) Mild  Hair Reviewed  Eyes Reviewed  Mouth Unable to assess  Skin Reviewed  Nails Reviewed    Diet Order:   Diet Order             Diet NPO time specified  Diet effective midnight  EDUCATION NEEDS:  No education needs have been identified at this time  Skin: Skin Integrity Issues: Stage II: anus (2 x 1 cm) Stage III: sacrum (5 x 2 cm) MASD sacrum  Last BM:  11/11 - type 6  Height:  Ht Readings from Last 1 Encounters:  07/12/23 6\' 1"  (1.854 m)    Weight:  Wt Readings from Last 1 Encounters:  07/12/23 92.8 kg    Ideal Body Weight:  83.6 kg  BMI:  Body mass index is 26.99 kg/m.  Estimated Nutritional Needs:  Kcal:  2200-2400 Protein:  120-140 gm Fluid:   2.2-2.4 L    Greig Castilla, RD, LDN Registered Dietitian II RD pager # available in AMION  After hours/weekend pager # available in [facility]

## 2023-07-13 NOTE — Progress Notes (Signed)
[facility] ADULT ICU REPLACEMENT PROTOCOL   The patient does apply for the [facility] Adult ICU Electrolyte Replacment Protocol based on the criteria listed below:   1.Exclusion criteria: TCTS, ECMO, Dialysis, and Myasthenia Gravis patients 2. Is GFR >/= 30 ml/min? Yes.    Patient's GFR today is >60 3. Is SCr </= 2? Yes.   Patient's SCr is 0.88 mg/dL 4. Did SCr increase >/= 0.5 in 24 hours? No. 5.Pt's weight >40kg  Yes.   6. Abnormal electrolyte(s): Potassium  7. Electrolytes replaced per protocol 8.  Call MD STAT for K+ </= 2.5, Phos </= 1, or Mag </= 1 Physician:  Dr. Nicolasa Ducking Joshua Eaton 07/13/2023 5:56 AM

## 2023-07-14 DIAGNOSIS — I480 Paroxysmal atrial fibrillation: Secondary | ICD-10-CM

## 2023-07-14 DIAGNOSIS — J9621 Acute and chronic respiratory failure with hypoxia: Secondary | ICD-10-CM

## 2023-07-14 DIAGNOSIS — Z930 Tracheostomy status: Secondary | ICD-10-CM

## 2023-07-14 DIAGNOSIS — J15212 Pneumonia due to Methicillin resistant Staphylococcus aureus: Secondary | ICD-10-CM

## 2023-07-14 LAB — COMPREHENSIVE METABOLIC PANEL
ALT: 62 U/L — ABNORMAL HIGH (ref 0–44)
AST: 52 U/L — ABNORMAL HIGH (ref 15–41)
Albumin: 1.9 g/dL — ABNORMAL LOW (ref 3.5–5.0)
Alkaline Phosphatase: 55 U/L (ref 38–126)
Anion gap: 9 (ref 5–15)
BUN: 46 mg/dL — ABNORMAL HIGH (ref 8–23)
CO2: 29 mmol/L (ref 22–32)
Calcium: 8.9 mg/dL (ref 8.9–10.3)
Chloride: 104 mmol/L (ref 98–111)
Creatinine, Ser: 0.95 mg/dL (ref 0.61–1.24)
GFR, Estimated: >60 mL/min (ref >60)
Glucose, Bld: 126 mg/dL — ABNORMAL HIGH (ref 70–99)
Potassium: 3.4 mmol/L — ABNORMAL LOW (ref 3.5–5.1)
Sodium: 142 mmol/L (ref 135–145)
Total Bilirubin: 1.2 mg/dL — ABNORMAL HIGH (ref <1.2)
Total Protein: 5.4 g/dL — ABNORMAL LOW (ref 6.5–8.1)

## 2023-07-14 LAB — CBC WITH DIFFERENTIAL/PLATELET
Abs Immature Granulocytes: 0.07 10*3/uL (ref 0.00–0.07)
Basophils Absolute: 0.1 10*3/uL (ref 0.0–0.1)
Basophils Relative: 1 %
Eosinophils Absolute: 0.4 10*3/uL (ref 0.0–0.5)
Eosinophils Relative: 5 %
HCT: 28.2 % — ABNORMAL LOW (ref 39.0–52.0)
Hemoglobin: 8.6 g/dL — ABNORMAL LOW (ref 13.0–17.0)
Immature Granulocytes: 1 %
Lymphocytes Relative: 23 %
Lymphs Abs: 2 10*3/uL (ref 0.7–4.0)
MCH: 31 pg (ref 26.0–34.0)
MCHC: 30.5 g/dL (ref 30.0–36.0)
MCV: 101.8 fL — ABNORMAL HIGH (ref 80.0–100.0)
Monocytes Absolute: 0.6 10*3/uL (ref 0.1–1.0)
Monocytes Relative: 6 %
Neutro Abs: 5.8 10*3/uL (ref 1.7–7.7)
Neutrophils Relative %: 64 %
Platelets: 151 10*3/uL (ref 150–400)
RBC: 2.77 MIL/uL — ABNORMAL LOW (ref 4.22–5.81)
RDW: 15.9 % — ABNORMAL HIGH (ref 11.5–15.5)
WBC: 8.9 10*3/uL (ref 4.0–10.5)
nRBC: 0.2 % (ref 0.0–0.2)

## 2023-07-14 LAB — HEMOGLOBIN A1C
Hgb A1c MFr Bld: 4.3 % — ABNORMAL LOW (ref 4.8–5.6)
Mean Plasma Glucose: 76.71 mg/dL

## 2023-07-14 LAB — TSH: TSH: 3.598 u[IU]/mL (ref 0.350–4.500)

## 2023-07-14 LAB — VANCOMYCIN, TROUGH: Vancomycin Tr: 19 ug/mL (ref 15–20)

## 2023-07-15 DIAGNOSIS — I480 Paroxysmal atrial fibrillation: Secondary | ICD-10-CM

## 2023-07-15 DIAGNOSIS — J9621 Acute and chronic respiratory failure with hypoxia: Secondary | ICD-10-CM

## 2023-07-15 DIAGNOSIS — Z930 Tracheostomy status: Secondary | ICD-10-CM

## 2023-07-15 DIAGNOSIS — J15212 Pneumonia due to Methicillin resistant Staphylococcus aureus: Secondary | ICD-10-CM

## 2023-07-16 DIAGNOSIS — Z930 Tracheostomy status: Secondary | ICD-10-CM

## 2023-07-16 DIAGNOSIS — J15212 Pneumonia due to Methicillin resistant Staphylococcus aureus: Secondary | ICD-10-CM

## 2023-07-16 DIAGNOSIS — I480 Paroxysmal atrial fibrillation: Secondary | ICD-10-CM

## 2023-07-16 DIAGNOSIS — J9621 Acute and chronic respiratory failure with hypoxia: Secondary | ICD-10-CM

## 2023-07-17 DIAGNOSIS — Z930 Tracheostomy status: Secondary | ICD-10-CM

## 2023-07-17 DIAGNOSIS — J9621 Acute and chronic respiratory failure with hypoxia: Secondary | ICD-10-CM

## 2023-07-17 DIAGNOSIS — J15212 Pneumonia due to Methicillin resistant Staphylococcus aureus: Secondary | ICD-10-CM

## 2023-07-17 DIAGNOSIS — I480 Paroxysmal atrial fibrillation: Secondary | ICD-10-CM

## 2023-07-17 LAB — CBC
HCT: 28.9 % — ABNORMAL LOW (ref 39.0–52.0)
Hemoglobin: 9 g/dL — ABNORMAL LOW (ref 13.0–17.0)
MCH: 32.5 pg (ref 26.0–34.0)
MCHC: 31.1 g/dL (ref 30.0–36.0)
MCV: 104.3 fL — ABNORMAL HIGH (ref 80.0–100.0)
Platelets: 148 10*3/uL — ABNORMAL LOW (ref 150–400)
RBC: 2.77 MIL/uL — ABNORMAL LOW (ref 4.22–5.81)
RDW: 16.7 % — ABNORMAL HIGH (ref 11.5–15.5)
WBC: 12.4 10*3/uL — ABNORMAL HIGH (ref 4.0–10.5)
nRBC: 0 % (ref 0.0–0.2)

## 2023-07-17 LAB — BASIC METABOLIC PANEL
Anion gap: 6 (ref 5–15)
BUN: 21 mg/dL (ref 8–23)
CO2: 31 mmol/L (ref 22–32)
Calcium: 8.9 mg/dL (ref 8.9–10.3)
Chloride: 106 mmol/L (ref 98–111)
Creatinine, Ser: 0.73 mg/dL (ref 0.61–1.24)
GFR, Estimated: >60 mL/min (ref >60)
Glucose, Bld: 138 mg/dL — ABNORMAL HIGH (ref 70–99)
Potassium: 3.2 mmol/L — ABNORMAL LOW (ref 3.5–5.1)
Sodium: 143 mmol/L (ref 135–145)

## 2023-07-17 LAB — MAGNESIUM: Magnesium: 2 mg/dL (ref 1.7–2.4)

## 2023-07-18 DIAGNOSIS — J15212 Pneumonia due to Methicillin resistant Staphylococcus aureus: Secondary | ICD-10-CM

## 2023-07-18 DIAGNOSIS — J9621 Acute and chronic respiratory failure with hypoxia: Secondary | ICD-10-CM

## 2023-07-18 DIAGNOSIS — I480 Paroxysmal atrial fibrillation: Secondary | ICD-10-CM

## 2023-07-18 DIAGNOSIS — Z930 Tracheostomy status: Secondary | ICD-10-CM

## 2023-07-18 LAB — POTASSIUM: Potassium: 3.9 mmol/L (ref 3.5–5.1)

## 2023-07-19 ENCOUNTER — Institutional Professional Consult (permissible substitution) (HOSPITAL_COMMUNITY): Payer: Medicare Other

## 2023-07-19 DIAGNOSIS — J15212 Pneumonia due to Methicillin resistant Staphylococcus aureus: Secondary | ICD-10-CM

## 2023-07-19 DIAGNOSIS — I480 Paroxysmal atrial fibrillation: Secondary | ICD-10-CM

## 2023-07-19 DIAGNOSIS — J9621 Acute and chronic respiratory failure with hypoxia: Secondary | ICD-10-CM

## 2023-07-19 DIAGNOSIS — Z930 Tracheostomy status: Secondary | ICD-10-CM

## 2023-07-19 MED ORDER — DIATRIZOATE MEGLUMINE & SODIUM 66-10 % PO SOLN
ORAL | Status: AC
Start: 2023-07-19 — End: ?
  Filled 2023-07-19: qty 30

## 2023-07-19 MED ORDER — DIATRIZOATE MEGLUMINE & SODIUM 66-10 % PO SOLN
30.0000 mL | Freq: Once | ORAL | Status: AC
  Administered 2023-07-19: 30 mL

## 2023-07-20 DIAGNOSIS — I480 Paroxysmal atrial fibrillation: Secondary | ICD-10-CM

## 2023-07-20 DIAGNOSIS — J15212 Pneumonia due to Methicillin resistant Staphylococcus aureus: Secondary | ICD-10-CM

## 2023-07-20 DIAGNOSIS — Z930 Tracheostomy status: Secondary | ICD-10-CM

## 2023-07-20 DIAGNOSIS — J9621 Acute and chronic respiratory failure with hypoxia: Secondary | ICD-10-CM

## 2023-07-20 LAB — CBC WITH DIFFERENTIAL/PLATELET
Abs Immature Granulocytes: 0.05 10*3/uL (ref 0.00–0.07)
Basophils Absolute: 0.1 10*3/uL (ref 0.0–0.1)
Basophils Relative: 1 %
Eosinophils Absolute: 0.5 10*3/uL (ref 0.0–0.5)
Eosinophils Relative: 6 %
HCT: 27 % — ABNORMAL LOW (ref 39.0–52.0)
Hemoglobin: 8.3 g/dL — ABNORMAL LOW (ref 13.0–17.0)
Immature Granulocytes: 1 %
Lymphocytes Relative: 17 %
Lymphs Abs: 1.6 10*3/uL (ref 0.7–4.0)
MCH: 31.2 pg (ref 26.0–34.0)
MCHC: 30.7 g/dL (ref 30.0–36.0)
MCV: 101.5 fL — ABNORMAL HIGH (ref 80.0–100.0)
Monocytes Absolute: 0.6 10*3/uL (ref 0.1–1.0)
Monocytes Relative: 6 %
Neutro Abs: 6.4 10*3/uL (ref 1.7–7.7)
Neutrophils Relative %: 69 %
Platelets: 123 10*3/uL — ABNORMAL LOW (ref 150–400)
RBC: 2.66 MIL/uL — ABNORMAL LOW (ref 4.22–5.81)
RDW: 16.2 % — ABNORMAL HIGH (ref 11.5–15.5)
WBC: 9.2 10*3/uL (ref 4.0–10.5)
nRBC: 0 % (ref 0.0–0.2)

## 2023-07-20 LAB — BASIC METABOLIC PANEL
Anion gap: 6 (ref 5–15)
BUN: 19 mg/dL (ref 8–23)
CO2: 31 mmol/L (ref 22–32)
Calcium: 8.8 mg/dL — ABNORMAL LOW (ref 8.9–10.3)
Chloride: 108 mmol/L (ref 98–111)
Creatinine, Ser: 0.78 mg/dL (ref 0.61–1.24)
GFR, Estimated: >60 mL/min (ref >60)
Glucose, Bld: 119 mg/dL — ABNORMAL HIGH (ref 70–99)
Potassium: 3.8 mmol/L (ref 3.5–5.1)
Sodium: 145 mmol/L (ref 135–145)

## 2023-07-20 LAB — MAGNESIUM: Magnesium: 2.1 mg/dL (ref 1.7–2.4)

## 2023-07-20 LAB — PHOSPHORUS: Phosphorus: 3.7 mg/dL (ref 2.5–4.6)

## 2023-07-21 DIAGNOSIS — J9621 Acute and chronic respiratory failure with hypoxia: Secondary | ICD-10-CM

## 2023-07-21 DIAGNOSIS — Z930 Tracheostomy status: Secondary | ICD-10-CM

## 2023-07-21 DIAGNOSIS — J15212 Pneumonia due to Methicillin resistant Staphylococcus aureus: Secondary | ICD-10-CM

## 2023-07-21 DIAGNOSIS — I480 Paroxysmal atrial fibrillation: Secondary | ICD-10-CM

## 2023-07-22 ENCOUNTER — Other Ambulatory Visit: Payer: Self-pay | Admitting: Psychiatry

## 2023-07-22 ENCOUNTER — Institutional Professional Consult (permissible substitution) (HOSPITAL_COMMUNITY): Payer: Medicare Other

## 2023-07-22 DIAGNOSIS — J9621 Acute and chronic respiratory failure with hypoxia: Secondary | ICD-10-CM

## 2023-07-22 DIAGNOSIS — J15212 Pneumonia due to Methicillin resistant Staphylococcus aureus: Secondary | ICD-10-CM

## 2023-07-22 DIAGNOSIS — F422 Mixed obsessional thoughts and acts: Secondary | ICD-10-CM

## 2023-07-22 DIAGNOSIS — I480 Paroxysmal atrial fibrillation: Secondary | ICD-10-CM

## 2023-07-22 DIAGNOSIS — Z930 Tracheostomy status: Secondary | ICD-10-CM

## 2023-07-22 LAB — LYME DISEASE SEROLOGY W/REFLEX: Lyme Total Antibody EIA: NEGATIVE

## 2023-07-22 LAB — RPR: RPR Ser Ql: NONREACTIVE

## 2023-07-23 DIAGNOSIS — J9621 Acute and chronic respiratory failure with hypoxia: Secondary | ICD-10-CM

## 2023-07-23 DIAGNOSIS — J15212 Pneumonia due to Methicillin resistant Staphylococcus aureus: Secondary | ICD-10-CM

## 2023-07-23 DIAGNOSIS — Z930 Tracheostomy status: Secondary | ICD-10-CM

## 2023-07-23 DIAGNOSIS — I480 Paroxysmal atrial fibrillation: Secondary | ICD-10-CM

## 2023-07-23 LAB — BASIC METABOLIC PANEL
Anion gap: 9 (ref 5–15)
BUN: 27 mg/dL — ABNORMAL HIGH (ref 8–23)
CO2: 32 mmol/L (ref 22–32)
Calcium: 8.7 mg/dL — ABNORMAL LOW (ref 8.9–10.3)
Chloride: 103 mmol/L (ref 98–111)
Creatinine, Ser: 0.86 mg/dL (ref 0.61–1.24)
GFR, Estimated: >60 mL/min (ref >60)
Glucose, Bld: 108 mg/dL — ABNORMAL HIGH (ref 70–99)
Potassium: 3.4 mmol/L — ABNORMAL LOW (ref 3.5–5.1)
Sodium: 144 mmol/L (ref 135–145)

## 2023-07-23 LAB — CBC WITH DIFFERENTIAL/PLATELET
Abs Immature Granulocytes: 0.05 10*3/uL (ref 0.00–0.07)
Basophils Absolute: 0.1 10*3/uL (ref 0.0–0.1)
Basophils Relative: 1 %
Eosinophils Absolute: 0.7 10*3/uL — ABNORMAL HIGH (ref 0.0–0.5)
Eosinophils Relative: 7 %
HCT: 27.1 % — ABNORMAL LOW (ref 39.0–52.0)
Hemoglobin: 8.4 g/dL — ABNORMAL LOW (ref 13.0–17.0)
Immature Granulocytes: 1 %
Lymphocytes Relative: 18 %
Lymphs Abs: 1.9 10*3/uL (ref 0.7–4.0)
MCH: 31.5 pg (ref 26.0–34.0)
MCHC: 31 g/dL (ref 30.0–36.0)
MCV: 101.5 fL — ABNORMAL HIGH (ref 80.0–100.0)
Monocytes Absolute: 0.9 10*3/uL (ref 0.1–1.0)
Monocytes Relative: 8 %
Neutro Abs: 7.2 10*3/uL (ref 1.7–7.7)
Neutrophils Relative %: 65 %
Platelets: 111 10*3/uL — ABNORMAL LOW (ref 150–400)
RBC: 2.67 MIL/uL — ABNORMAL LOW (ref 4.22–5.81)
RDW: 16 % — ABNORMAL HIGH (ref 11.5–15.5)
WBC: 10.8 10*3/uL — ABNORMAL HIGH (ref 4.0–10.5)
nRBC: 0 % (ref 0.0–0.2)

## 2023-07-23 LAB — MAGNESIUM: Magnesium: 2.1 mg/dL (ref 1.7–2.4)

## 2023-07-23 LAB — ANTINUCLEAR ANTIBODIES, IFA: ANA Ab, IFA: NEGATIVE

## 2023-07-23 LAB — PHOSPHORUS: Phosphorus: 3.6 mg/dL (ref 2.5–4.6)

## 2023-07-24 DIAGNOSIS — I480 Paroxysmal atrial fibrillation: Secondary | ICD-10-CM

## 2023-07-24 DIAGNOSIS — Z930 Tracheostomy status: Secondary | ICD-10-CM

## 2023-07-24 DIAGNOSIS — J15212 Pneumonia due to Methicillin resistant Staphylococcus aureus: Secondary | ICD-10-CM

## 2023-07-24 DIAGNOSIS — J9621 Acute and chronic respiratory failure with hypoxia: Secondary | ICD-10-CM

## 2023-07-25 DIAGNOSIS — J15212 Pneumonia due to Methicillin resistant Staphylococcus aureus: Secondary | ICD-10-CM

## 2023-07-25 DIAGNOSIS — I480 Paroxysmal atrial fibrillation: Secondary | ICD-10-CM

## 2023-07-25 DIAGNOSIS — Z930 Tracheostomy status: Secondary | ICD-10-CM

## 2023-07-25 DIAGNOSIS — J9621 Acute and chronic respiratory failure with hypoxia: Secondary | ICD-10-CM

## 2023-07-25 LAB — BASIC METABOLIC PANEL
Anion gap: 7 (ref 5–15)
BUN: 22 mg/dL (ref 8–23)
CO2: 33 mmol/L — ABNORMAL HIGH (ref 22–32)
Calcium: 8.7 mg/dL — ABNORMAL LOW (ref 8.9–10.3)
Chloride: 102 mmol/L (ref 98–111)
Creatinine, Ser: 0.75 mg/dL (ref 0.61–1.24)
GFR, Estimated: >60 mL/min (ref >60)
Glucose, Bld: 142 mg/dL — ABNORMAL HIGH (ref 70–99)
Potassium: 3.6 mmol/L (ref 3.5–5.1)
Sodium: 142 mmol/L (ref 135–145)

## 2023-07-26 ENCOUNTER — Ambulatory Visit: Payer: Medicare Other | Admitting: Psychiatry

## 2023-07-26 DIAGNOSIS — J9621 Acute and chronic respiratory failure with hypoxia: Secondary | ICD-10-CM

## 2023-07-26 DIAGNOSIS — Z930 Tracheostomy status: Secondary | ICD-10-CM

## 2023-07-26 DIAGNOSIS — J15212 Pneumonia due to Methicillin resistant Staphylococcus aureus: Secondary | ICD-10-CM

## 2023-07-26 DIAGNOSIS — I480 Paroxysmal atrial fibrillation: Secondary | ICD-10-CM

## 2023-07-26 LAB — CBC WITH DIFFERENTIAL/PLATELET
Abs Immature Granulocytes: 0.06 10*3/uL (ref 0.00–0.07)
Basophils Absolute: 0.1 10*3/uL (ref 0.0–0.1)
Basophils Relative: 1 %
Eosinophils Absolute: 0.5 10*3/uL (ref 0.0–0.5)
Eosinophils Relative: 5 %
HCT: 26.5 % — ABNORMAL LOW (ref 39.0–52.0)
Hemoglobin: 8.2 g/dL — ABNORMAL LOW (ref 13.0–17.0)
Immature Granulocytes: 1 %
Lymphocytes Relative: 15 %
Lymphs Abs: 1.4 10*3/uL (ref 0.7–4.0)
MCH: 31.3 pg (ref 26.0–34.0)
MCHC: 30.9 g/dL (ref 30.0–36.0)
MCV: 101.1 fL — ABNORMAL HIGH (ref 80.0–100.0)
Monocytes Absolute: 0.7 10*3/uL (ref 0.1–1.0)
Monocytes Relative: 8 %
Neutro Abs: 6.9 10*3/uL (ref 1.7–7.7)
Neutrophils Relative %: 70 %
Platelets: 119 10*3/uL — ABNORMAL LOW (ref 150–400)
RBC: 2.62 MIL/uL — ABNORMAL LOW (ref 4.22–5.81)
RDW: 16 % — ABNORMAL HIGH (ref 11.5–15.5)
WBC: 9.7 10*3/uL (ref 4.0–10.5)
nRBC: 0 % (ref 0.0–0.2)

## 2023-07-26 LAB — BASIC METABOLIC PANEL
Anion gap: 8 (ref 5–15)
BUN: 27 mg/dL — ABNORMAL HIGH (ref 8–23)
CO2: 33 mmol/L — ABNORMAL HIGH (ref 22–32)
Calcium: 8.8 mg/dL — ABNORMAL LOW (ref 8.9–10.3)
Chloride: 101 mmol/L (ref 98–111)
Creatinine, Ser: 1.02 mg/dL (ref 0.61–1.24)
GFR, Estimated: >60 mL/min (ref >60)
Glucose, Bld: 147 mg/dL — ABNORMAL HIGH (ref 70–99)
Potassium: 3.7 mmol/L (ref 3.5–5.1)
Sodium: 142 mmol/L (ref 135–145)

## 2023-07-26 LAB — PHOSPHORUS: Phosphorus: 3.3 mg/dL (ref 2.5–4.6)

## 2023-07-26 LAB — MAGNESIUM: Magnesium: 2.2 mg/dL (ref 1.7–2.4)

## 2023-07-27 DIAGNOSIS — I480 Paroxysmal atrial fibrillation: Secondary | ICD-10-CM

## 2023-07-27 DIAGNOSIS — Z930 Tracheostomy status: Secondary | ICD-10-CM

## 2023-07-27 DIAGNOSIS — J9621 Acute and chronic respiratory failure with hypoxia: Secondary | ICD-10-CM

## 2023-07-27 DIAGNOSIS — J15212 Pneumonia due to Methicillin resistant Staphylococcus aureus: Secondary | ICD-10-CM

## 2023-07-28 DIAGNOSIS — Z930 Tracheostomy status: Secondary | ICD-10-CM

## 2023-07-28 DIAGNOSIS — I480 Paroxysmal atrial fibrillation: Secondary | ICD-10-CM

## 2023-07-28 DIAGNOSIS — J9621 Acute and chronic respiratory failure with hypoxia: Secondary | ICD-10-CM

## 2023-07-28 DIAGNOSIS — J15212 Pneumonia due to Methicillin resistant Staphylococcus aureus: Secondary | ICD-10-CM

## 2023-07-29 DIAGNOSIS — J15212 Pneumonia due to Methicillin resistant Staphylococcus aureus: Secondary | ICD-10-CM

## 2023-07-29 DIAGNOSIS — J9621 Acute and chronic respiratory failure with hypoxia: Secondary | ICD-10-CM

## 2023-07-29 DIAGNOSIS — Z930 Tracheostomy status: Secondary | ICD-10-CM

## 2023-07-29 DIAGNOSIS — I480 Paroxysmal atrial fibrillation: Secondary | ICD-10-CM

## 2023-07-29 LAB — CBC WITH DIFFERENTIAL/PLATELET
Abs Immature Granulocytes: 0.1 10*3/uL — ABNORMAL HIGH (ref 0.00–0.07)
Basophils Absolute: 0 10*3/uL (ref 0.0–0.1)
Basophils Relative: 0 %
Eosinophils Absolute: 0.6 10*3/uL — ABNORMAL HIGH (ref 0.0–0.5)
Eosinophils Relative: 5 %
HCT: 25.2 % — ABNORMAL LOW (ref 39.0–52.0)
Hemoglobin: 8 g/dL — ABNORMAL LOW (ref 13.0–17.0)
Immature Granulocytes: 1 %
Lymphocytes Relative: 13 %
Lymphs Abs: 1.6 10*3/uL (ref 0.7–4.0)
MCH: 32.3 pg (ref 26.0–34.0)
MCHC: 31.7 g/dL (ref 30.0–36.0)
MCV: 101.6 fL — ABNORMAL HIGH (ref 80.0–100.0)
Monocytes Absolute: 0.9 10*3/uL (ref 0.1–1.0)
Monocytes Relative: 7 %
Neutro Abs: 9.5 10*3/uL — ABNORMAL HIGH (ref 1.7–7.7)
Neutrophils Relative %: 74 %
Platelets: 137 10*3/uL — ABNORMAL LOW (ref 150–400)
RBC: 2.48 MIL/uL — ABNORMAL LOW (ref 4.22–5.81)
RDW: 15.8 % — ABNORMAL HIGH (ref 11.5–15.5)
WBC: 12.7 10*3/uL — ABNORMAL HIGH (ref 4.0–10.5)
nRBC: 0 % (ref 0.0–0.2)

## 2023-07-29 LAB — BASIC METABOLIC PANEL
Anion gap: 4 — ABNORMAL LOW (ref 5–15)
BUN: 26 mg/dL — ABNORMAL HIGH (ref 8–23)
CO2: 32 mmol/L (ref 22–32)
Calcium: 8.7 mg/dL — ABNORMAL LOW (ref 8.9–10.3)
Chloride: 103 mmol/L (ref 98–111)
Creatinine, Ser: 0.69 mg/dL (ref 0.61–1.24)
GFR, Estimated: >60 mL/min (ref >60)
Glucose, Bld: 144 mg/dL — ABNORMAL HIGH (ref 70–99)
Potassium: 3.5 mmol/L (ref 3.5–5.1)
Sodium: 139 mmol/L (ref 135–145)

## 2023-07-29 LAB — PHOSPHORUS: Phosphorus: 3.6 mg/dL (ref 2.5–4.6)

## 2023-07-29 LAB — MAGNESIUM: Magnesium: 2.2 mg/dL (ref 1.7–2.4)

## 2023-07-30 DIAGNOSIS — J15212 Pneumonia due to Methicillin resistant Staphylococcus aureus: Secondary | ICD-10-CM

## 2023-07-30 DIAGNOSIS — J9621 Acute and chronic respiratory failure with hypoxia: Secondary | ICD-10-CM

## 2023-07-30 DIAGNOSIS — I480 Paroxysmal atrial fibrillation: Secondary | ICD-10-CM

## 2023-07-30 DIAGNOSIS — Z930 Tracheostomy status: Secondary | ICD-10-CM

## 2023-07-31 ENCOUNTER — Institutional Professional Consult (permissible substitution) (HOSPITAL_COMMUNITY): Payer: Medicare Other

## 2023-07-31 DIAGNOSIS — J9621 Acute and chronic respiratory failure with hypoxia: Secondary | ICD-10-CM

## 2023-07-31 DIAGNOSIS — Z930 Tracheostomy status: Secondary | ICD-10-CM

## 2023-07-31 DIAGNOSIS — I480 Paroxysmal atrial fibrillation: Secondary | ICD-10-CM

## 2023-07-31 DIAGNOSIS — J15212 Pneumonia due to Methicillin resistant Staphylococcus aureus: Secondary | ICD-10-CM

## 2023-07-31 LAB — CBC
HCT: 22.5 % — ABNORMAL LOW (ref 39.0–52.0)
Hemoglobin: 7.1 g/dL — ABNORMAL LOW (ref 13.0–17.0)
MCH: 32.1 pg (ref 26.0–34.0)
MCHC: 31.6 g/dL (ref 30.0–36.0)
MCV: 101.8 fL — ABNORMAL HIGH (ref 80.0–100.0)
Platelets: 143 10*3/uL — ABNORMAL LOW (ref 150–400)
RBC: 2.21 MIL/uL — ABNORMAL LOW (ref 4.22–5.81)
RDW: 15.9 % — ABNORMAL HIGH (ref 11.5–15.5)
WBC: 12.5 10*3/uL — ABNORMAL HIGH (ref 4.0–10.5)
nRBC: 0.2 % (ref 0.0–0.2)

## 2023-07-31 LAB — OCCULT BLOOD X 1 CARD TO LAB, STOOL: Fecal Occult Bld: NEGATIVE

## 2023-08-01 DIAGNOSIS — Z930 Tracheostomy status: Secondary | ICD-10-CM

## 2023-08-01 DIAGNOSIS — J9621 Acute and chronic respiratory failure with hypoxia: Secondary | ICD-10-CM

## 2023-08-01 DIAGNOSIS — J15212 Pneumonia due to Methicillin resistant Staphylococcus aureus: Secondary | ICD-10-CM

## 2023-08-01 DIAGNOSIS — I480 Paroxysmal atrial fibrillation: Secondary | ICD-10-CM

## 2023-08-01 LAB — COMPREHENSIVE METABOLIC PANEL
ALT: 24 U/L (ref 0–44)
AST: 38 U/L (ref 15–41)
Albumin: 2.1 g/dL — ABNORMAL LOW (ref 3.5–5.0)
Alkaline Phosphatase: 61 U/L (ref 38–126)
Anion gap: 5 (ref 5–15)
BUN: 30 mg/dL — ABNORMAL HIGH (ref 8–23)
CO2: 33 mmol/L — ABNORMAL HIGH (ref 22–32)
Calcium: 8.6 mg/dL — ABNORMAL LOW (ref 8.9–10.3)
Chloride: 102 mmol/L (ref 98–111)
Creatinine, Ser: 0.88 mg/dL (ref 0.61–1.24)
GFR, Estimated: >60 mL/min (ref >60)
Glucose, Bld: 166 mg/dL — ABNORMAL HIGH (ref 70–99)
Potassium: 3.9 mmol/L (ref 3.5–5.1)
Sodium: 140 mmol/L (ref 135–145)
Total Bilirubin: 1.3 mg/dL — ABNORMAL HIGH (ref <1.2)
Total Protein: 5.9 g/dL — ABNORMAL LOW (ref 6.5–8.1)

## 2023-08-01 LAB — CBC WITH DIFFERENTIAL/PLATELET
Abs Immature Granulocytes: 0.09 10*3/uL — ABNORMAL HIGH (ref 0.00–0.07)
Basophils Absolute: 0.1 10*3/uL (ref 0.0–0.1)
Basophils Relative: 0 %
Eosinophils Absolute: 0.5 10*3/uL (ref 0.0–0.5)
Eosinophils Relative: 4 %
HCT: 22.3 % — ABNORMAL LOW (ref 39.0–52.0)
Hemoglobin: 6.9 g/dL — CL (ref 13.0–17.0)
Immature Granulocytes: 1 %
Lymphocytes Relative: 10 %
Lymphs Abs: 1.2 10*3/uL (ref 0.7–4.0)
MCH: 31.9 pg (ref 26.0–34.0)
MCHC: 30.9 g/dL (ref 30.0–36.0)
MCV: 103.2 fL — ABNORMAL HIGH (ref 80.0–100.0)
Monocytes Absolute: 0.7 10*3/uL (ref 0.1–1.0)
Monocytes Relative: 6 %
Neutro Abs: 9 10*3/uL — ABNORMAL HIGH (ref 1.7–7.7)
Neutrophils Relative %: 79 %
Platelets: 132 10*3/uL — ABNORMAL LOW (ref 150–400)
RBC: 2.16 MIL/uL — ABNORMAL LOW (ref 4.22–5.81)
RDW: 15.9 % — ABNORMAL HIGH (ref 11.5–15.5)
WBC: 11.5 10*3/uL — ABNORMAL HIGH (ref 4.0–10.5)
nRBC: 0.2 % (ref 0.0–0.2)

## 2023-08-01 LAB — PREPARE RBC (CROSSMATCH)

## 2023-08-02 ENCOUNTER — Institutional Professional Consult (permissible substitution) (HOSPITAL_COMMUNITY): Payer: Medicare Other

## 2023-08-02 DIAGNOSIS — J15212 Pneumonia due to Methicillin resistant Staphylococcus aureus: Secondary | ICD-10-CM

## 2023-08-02 DIAGNOSIS — J9621 Acute and chronic respiratory failure with hypoxia: Secondary | ICD-10-CM

## 2023-08-02 DIAGNOSIS — Z930 Tracheostomy status: Secondary | ICD-10-CM

## 2023-08-02 DIAGNOSIS — I480 Paroxysmal atrial fibrillation: Secondary | ICD-10-CM

## 2023-08-02 LAB — TYPE AND SCREEN
ABO/RH(D): O POS
Antibody Screen: NEGATIVE
Unit division: 0

## 2023-08-02 LAB — CBC WITH DIFFERENTIAL/PLATELET
Abs Immature Granulocytes: 0.06 10*3/uL (ref 0.00–0.07)
Basophils Absolute: 0.1 10*3/uL (ref 0.0–0.1)
Basophils Relative: 1 %
Eosinophils Absolute: 0.7 10*3/uL — ABNORMAL HIGH (ref 0.0–0.5)
Eosinophils Relative: 6 %
HCT: 23.7 % — ABNORMAL LOW (ref 39.0–52.0)
Hemoglobin: 7.3 g/dL — ABNORMAL LOW (ref 13.0–17.0)
Immature Granulocytes: 1 %
Lymphocytes Relative: 17 %
Lymphs Abs: 1.9 10*3/uL (ref 0.7–4.0)
MCH: 30.3 pg (ref 26.0–34.0)
MCHC: 30.8 g/dL (ref 30.0–36.0)
MCV: 98.3 fL (ref 80.0–100.0)
Monocytes Absolute: 0.7 10*3/uL (ref 0.1–1.0)
Monocytes Relative: 7 %
Neutro Abs: 7.4 10*3/uL (ref 1.7–7.7)
Neutrophils Relative %: 68 %
Platelets: 131 10*3/uL — ABNORMAL LOW (ref 150–400)
RBC: 2.41 MIL/uL — ABNORMAL LOW (ref 4.22–5.81)
RDW: 17.8 % — ABNORMAL HIGH (ref 11.5–15.5)
WBC: 10.7 10*3/uL — ABNORMAL HIGH (ref 4.0–10.5)
nRBC: 0 % (ref 0.0–0.2)

## 2023-08-02 LAB — BPAM RBC
Blood Product Expiration Date: 202412262359
ISSUE DATE / TIME: 202412011330
Unit Type and Rh: 5100

## 2023-08-03 DIAGNOSIS — J9621 Acute and chronic respiratory failure with hypoxia: Secondary | ICD-10-CM

## 2023-08-03 DIAGNOSIS — Z930 Tracheostomy status: Secondary | ICD-10-CM

## 2023-08-03 DIAGNOSIS — I480 Paroxysmal atrial fibrillation: Secondary | ICD-10-CM

## 2023-08-03 DIAGNOSIS — J15212 Pneumonia due to Methicillin resistant Staphylococcus aureus: Secondary | ICD-10-CM

## 2023-08-04 ENCOUNTER — Institutional Professional Consult (permissible substitution) (HOSPITAL_COMMUNITY): Payer: Medicare Other

## 2023-08-04 DIAGNOSIS — J15212 Pneumonia due to Methicillin resistant Staphylococcus aureus: Secondary | ICD-10-CM

## 2023-08-04 DIAGNOSIS — J9621 Acute and chronic respiratory failure with hypoxia: Secondary | ICD-10-CM

## 2023-08-04 DIAGNOSIS — Z930 Tracheostomy status: Secondary | ICD-10-CM

## 2023-08-04 DIAGNOSIS — I480 Paroxysmal atrial fibrillation: Secondary | ICD-10-CM

## 2023-08-04 LAB — COMPREHENSIVE METABOLIC PANEL
ALT: 27 U/L (ref 0–44)
AST: 32 U/L (ref 15–41)
Albumin: 2 g/dL — ABNORMAL LOW (ref 3.5–5.0)
Alkaline Phosphatase: 58 U/L (ref 38–126)
Anion gap: 6 (ref 5–15)
BUN: 30 mg/dL — ABNORMAL HIGH (ref 8–23)
CO2: 34 mmol/L — ABNORMAL HIGH (ref 22–32)
Calcium: 8.7 mg/dL — ABNORMAL LOW (ref 8.9–10.3)
Chloride: 101 mmol/L (ref 98–111)
Creatinine, Ser: 0.83 mg/dL (ref 0.61–1.24)
GFR, Estimated: >60 mL/min (ref >60)
Glucose, Bld: 146 mg/dL — ABNORMAL HIGH (ref 70–99)
Potassium: 3.9 mmol/L (ref 3.5–5.1)
Sodium: 141 mmol/L (ref 135–145)
Total Bilirubin: 1.3 mg/dL — ABNORMAL HIGH (ref <1.2)
Total Protein: 6.1 g/dL — ABNORMAL LOW (ref 6.5–8.1)

## 2023-08-04 LAB — CBC WITH DIFFERENTIAL/PLATELET
Abs Immature Granulocytes: 0.08 10*3/uL — ABNORMAL HIGH (ref 0.00–0.07)
Basophils Absolute: 0.1 10*3/uL (ref 0.0–0.1)
Basophils Relative: 1 %
Eosinophils Absolute: 0.7 10*3/uL — ABNORMAL HIGH (ref 0.0–0.5)
Eosinophils Relative: 7 %
HCT: 24.3 % — ABNORMAL LOW (ref 39.0–52.0)
Hemoglobin: 7.5 g/dL — ABNORMAL LOW (ref 13.0–17.0)
Immature Granulocytes: 1 %
Lymphocytes Relative: 18 %
Lymphs Abs: 1.9 10*3/uL (ref 0.7–4.0)
MCH: 30.9 pg (ref 26.0–34.0)
MCHC: 30.9 g/dL (ref 30.0–36.0)
MCV: 100 fL (ref 80.0–100.0)
Monocytes Absolute: 0.7 10*3/uL (ref 0.1–1.0)
Monocytes Relative: 6 %
Neutro Abs: 7.3 10*3/uL (ref 1.7–7.7)
Neutrophils Relative %: 67 %
Platelets: 136 10*3/uL — ABNORMAL LOW (ref 150–400)
RBC: 2.43 MIL/uL — ABNORMAL LOW (ref 4.22–5.81)
RDW: 16.6 % — ABNORMAL HIGH (ref 11.5–15.5)
WBC: 10.7 10*3/uL — ABNORMAL HIGH (ref 4.0–10.5)
nRBC: 0 % (ref 0.0–0.2)

## 2023-08-05 DIAGNOSIS — Z930 Tracheostomy status: Secondary | ICD-10-CM

## 2023-08-05 DIAGNOSIS — I480 Paroxysmal atrial fibrillation: Secondary | ICD-10-CM

## 2023-08-05 DIAGNOSIS — J15212 Pneumonia due to Methicillin resistant Staphylococcus aureus: Secondary | ICD-10-CM

## 2023-08-05 DIAGNOSIS — J9621 Acute and chronic respiratory failure with hypoxia: Secondary | ICD-10-CM

## 2023-08-05 LAB — CBC WITH DIFFERENTIAL/PLATELET
Abs Immature Granulocytes: 0.06 10*3/uL (ref 0.00–0.07)
Basophils Absolute: 0.1 10*3/uL (ref 0.0–0.1)
Basophils Relative: 1 %
Eosinophils Absolute: 0.4 10*3/uL (ref 0.0–0.5)
Eosinophils Relative: 4 %
HCT: 23.5 % — ABNORMAL LOW (ref 39.0–52.0)
Hemoglobin: 7.1 g/dL — ABNORMAL LOW (ref 13.0–17.0)
Immature Granulocytes: 1 %
Lymphocytes Relative: 14 %
Lymphs Abs: 1.4 10*3/uL (ref 0.7–4.0)
MCH: 30.2 pg (ref 26.0–34.0)
MCHC: 30.2 g/dL (ref 30.0–36.0)
MCV: 100 fL (ref 80.0–100.0)
Monocytes Absolute: 0.4 10*3/uL (ref 0.1–1.0)
Monocytes Relative: 4 %
Neutro Abs: 7.6 10*3/uL (ref 1.7–7.7)
Neutrophils Relative %: 76 %
Platelets: 126 10*3/uL — ABNORMAL LOW (ref 150–400)
RBC: 2.35 MIL/uL — ABNORMAL LOW (ref 4.22–5.81)
RDW: 16.1 % — ABNORMAL HIGH (ref 11.5–15.5)
WBC: 10 10*3/uL (ref 4.0–10.5)
nRBC: 0 % (ref 0.0–0.2)

## 2023-08-05 LAB — APTT: aPTT: 27 s (ref 24–36)

## 2023-08-05 LAB — FIBRINOGEN: Fibrinogen: 583 mg/dL — ABNORMAL HIGH (ref 210–475)

## 2023-08-05 LAB — PROTIME-INR
INR: 1.2 (ref 0.8–1.2)
Prothrombin Time: 15.5 s — ABNORMAL HIGH (ref 11.4–15.2)

## 2023-08-06 DIAGNOSIS — Z930 Tracheostomy status: Secondary | ICD-10-CM

## 2023-08-06 DIAGNOSIS — I480 Paroxysmal atrial fibrillation: Secondary | ICD-10-CM

## 2023-08-06 DIAGNOSIS — J15212 Pneumonia due to Methicillin resistant Staphylococcus aureus: Secondary | ICD-10-CM

## 2023-08-06 DIAGNOSIS — J9621 Acute and chronic respiratory failure with hypoxia: Secondary | ICD-10-CM

## 2023-08-06 LAB — BASIC METABOLIC PANEL
Anion gap: 14 (ref 5–15)
BUN: 30 mg/dL — ABNORMAL HIGH (ref 8–23)
CO2: 27 mmol/L (ref 22–32)
Calcium: 8.9 mg/dL (ref 8.9–10.3)
Chloride: 99 mmol/L (ref 98–111)
Creatinine, Ser: 0.87 mg/dL (ref 0.61–1.24)
GFR, Estimated: >60 mL/min (ref >60)
Glucose, Bld: 111 mg/dL — ABNORMAL HIGH (ref 70–99)
Potassium: 4.6 mmol/L (ref 3.5–5.1)
Sodium: 140 mmol/L (ref 135–145)

## 2023-08-06 LAB — HEMOGLOBIN AND HEMATOCRIT, BLOOD
HCT: 23.3 % — ABNORMAL LOW (ref 39.0–52.0)
Hemoglobin: 7.2 g/dL — ABNORMAL LOW (ref 13.0–17.0)

## 2023-08-06 LAB — PREPARE RBC (CROSSMATCH)

## 2023-08-07 LAB — CULTURE, RESPIRATORY W GRAM STAIN

## 2023-08-07 LAB — HEMOGLOBIN AND HEMATOCRIT, BLOOD
HCT: 23.8 % — ABNORMAL LOW (ref 39.0–52.0)
Hemoglobin: 7.4 g/dL — ABNORMAL LOW (ref 13.0–17.0)

## 2023-08-08 DIAGNOSIS — Z930 Tracheostomy status: Secondary | ICD-10-CM

## 2023-08-08 DIAGNOSIS — I480 Paroxysmal atrial fibrillation: Secondary | ICD-10-CM

## 2023-08-08 DIAGNOSIS — J15212 Pneumonia due to Methicillin resistant Staphylococcus aureus: Secondary | ICD-10-CM

## 2023-08-08 DIAGNOSIS — J9621 Acute and chronic respiratory failure with hypoxia: Secondary | ICD-10-CM

## 2023-08-08 LAB — CBC
HCT: 24.4 % — ABNORMAL LOW (ref 39.0–52.0)
Hemoglobin: 7.6 g/dL — ABNORMAL LOW (ref 13.0–17.0)
MCH: 30.2 pg (ref 26.0–34.0)
MCHC: 31.1 g/dL (ref 30.0–36.0)
MCV: 96.8 fL (ref 80.0–100.0)
Platelets: 145 10*3/uL — ABNORMAL LOW (ref 150–400)
RBC: 2.52 MIL/uL — ABNORMAL LOW (ref 4.22–5.81)
RDW: 17.5 % — ABNORMAL HIGH (ref 11.5–15.5)
WBC: 10 10*3/uL (ref 4.0–10.5)
nRBC: 0 % (ref 0.0–0.2)

## 2023-08-08 LAB — BASIC METABOLIC PANEL
Anion gap: 8 (ref 5–15)
BUN: 24 mg/dL — ABNORMAL HIGH (ref 8–23)
CO2: 33 mmol/L — ABNORMAL HIGH (ref 22–32)
Calcium: 8.5 mg/dL — ABNORMAL LOW (ref 8.9–10.3)
Chloride: 99 mmol/L (ref 98–111)
Creatinine, Ser: 0.86 mg/dL (ref 0.61–1.24)
GFR, Estimated: >60 mL/min (ref >60)
Glucose, Bld: 133 mg/dL — ABNORMAL HIGH (ref 70–99)
Potassium: 3.7 mmol/L (ref 3.5–5.1)
Sodium: 140 mmol/L (ref 135–145)

## 2023-08-08 LAB — PREPARE RBC (CROSSMATCH)

## 2023-08-08 LAB — MAGNESIUM: Magnesium: 2.3 mg/dL (ref 1.7–2.4)

## 2023-08-09 DIAGNOSIS — J15212 Pneumonia due to Methicillin resistant Staphylococcus aureus: Secondary | ICD-10-CM

## 2023-08-09 DIAGNOSIS — Z930 Tracheostomy status: Secondary | ICD-10-CM

## 2023-08-09 DIAGNOSIS — J9621 Acute and chronic respiratory failure with hypoxia: Secondary | ICD-10-CM

## 2023-08-09 DIAGNOSIS — I480 Paroxysmal atrial fibrillation: Secondary | ICD-10-CM

## 2023-08-09 LAB — CBC WITH DIFFERENTIAL/PLATELET
Abs Immature Granulocytes: 0.09 10*3/uL — ABNORMAL HIGH (ref 0.00–0.07)
Basophils Absolute: 0.1 10*3/uL (ref 0.0–0.1)
Basophils Relative: 1 %
Eosinophils Absolute: 0.4 10*3/uL (ref 0.0–0.5)
Eosinophils Relative: 4 %
HCT: 28.8 % — ABNORMAL LOW (ref 39.0–52.0)
Hemoglobin: 9.2 g/dL — ABNORMAL LOW (ref 13.0–17.0)
Immature Granulocytes: 1 %
Lymphocytes Relative: 11 %
Lymphs Abs: 1.2 10*3/uL (ref 0.7–4.0)
MCH: 30.7 pg (ref 26.0–34.0)
MCHC: 31.9 g/dL (ref 30.0–36.0)
MCV: 96 fL (ref 80.0–100.0)
Monocytes Absolute: 0.8 10*3/uL (ref 0.1–1.0)
Monocytes Relative: 8 %
Neutro Abs: 8.5 10*3/uL — ABNORMAL HIGH (ref 1.7–7.7)
Neutrophils Relative %: 75 %
Platelets: 160 10*3/uL (ref 150–400)
RBC: 3 MIL/uL — ABNORMAL LOW (ref 4.22–5.81)
RDW: 17 % — ABNORMAL HIGH (ref 11.5–15.5)
WBC: 11.1 10*3/uL — ABNORMAL HIGH (ref 4.0–10.5)
nRBC: 0 % (ref 0.0–0.2)

## 2023-08-09 LAB — TYPE AND SCREEN
ABO/RH(D): O POS
Antibody Screen: NEGATIVE
Unit division: 0
Unit division: 0

## 2023-08-09 LAB — BPAM RBC
Blood Product Expiration Date: 202412262359
Blood Product Expiration Date: 202412282359
ISSUE DATE / TIME: 202412081310
ISSUE DATE / TIME: 202412061822
Unit Type and Rh: 5100
Unit Type and Rh: 5100

## 2023-08-10 DIAGNOSIS — Z930 Tracheostomy status: Secondary | ICD-10-CM

## 2023-08-10 DIAGNOSIS — I480 Paroxysmal atrial fibrillation: Secondary | ICD-10-CM

## 2023-08-10 DIAGNOSIS — J15212 Pneumonia due to Methicillin resistant Staphylococcus aureus: Secondary | ICD-10-CM

## 2023-08-10 DIAGNOSIS — J9621 Acute and chronic respiratory failure with hypoxia: Secondary | ICD-10-CM

## 2023-08-11 DIAGNOSIS — Z930 Tracheostomy status: Secondary | ICD-10-CM

## 2023-08-11 DIAGNOSIS — J15212 Pneumonia due to Methicillin resistant Staphylococcus aureus: Secondary | ICD-10-CM

## 2023-08-11 DIAGNOSIS — J9621 Acute and chronic respiratory failure with hypoxia: Secondary | ICD-10-CM

## 2023-08-11 DIAGNOSIS — I480 Paroxysmal atrial fibrillation: Secondary | ICD-10-CM

## 2023-08-12 ENCOUNTER — Institutional Professional Consult (permissible substitution) (HOSPITAL_COMMUNITY): Payer: Medicare Other

## 2023-08-12 ENCOUNTER — Other Ambulatory Visit: Payer: Self-pay | Admitting: Psychiatry

## 2023-08-12 DIAGNOSIS — J9621 Acute and chronic respiratory failure with hypoxia: Secondary | ICD-10-CM

## 2023-08-12 DIAGNOSIS — Z930 Tracheostomy status: Secondary | ICD-10-CM

## 2023-08-12 DIAGNOSIS — I480 Paroxysmal atrial fibrillation: Secondary | ICD-10-CM

## 2023-08-12 DIAGNOSIS — J15212 Pneumonia due to Methicillin resistant Staphylococcus aureus: Secondary | ICD-10-CM

## 2023-08-12 LAB — CBC WITH DIFFERENTIAL/PLATELET
Abs Immature Granulocytes: 0.07 10*3/uL (ref 0.00–0.07)
Basophils Absolute: 0 10*3/uL (ref 0.0–0.1)
Basophils Relative: 0 %
Eosinophils Absolute: 0.1 10*3/uL (ref 0.0–0.5)
Eosinophils Relative: 1 %
HCT: 28.2 % — ABNORMAL LOW (ref 39.0–52.0)
Hemoglobin: 8.7 g/dL — ABNORMAL LOW (ref 13.0–17.0)
Immature Granulocytes: 1 %
Lymphocytes Relative: 6 %
Lymphs Abs: 0.8 10*3/uL (ref 0.7–4.0)
MCH: 29.9 pg (ref 26.0–34.0)
MCHC: 30.9 g/dL (ref 30.0–36.0)
MCV: 96.9 fL (ref 80.0–100.0)
Monocytes Absolute: 0.8 10*3/uL (ref 0.1–1.0)
Monocytes Relative: 6 %
Neutro Abs: 11.6 10*3/uL — ABNORMAL HIGH (ref 1.7–7.7)
Neutrophils Relative %: 86 %
Platelets: 170 10*3/uL (ref 150–400)
RBC: 2.91 MIL/uL — ABNORMAL LOW (ref 4.22–5.81)
RDW: 16.3 % — ABNORMAL HIGH (ref 11.5–15.5)
WBC: 13.5 10*3/uL — ABNORMAL HIGH (ref 4.0–10.5)
nRBC: 0 % (ref 0.0–0.2)

## 2023-08-12 LAB — BASIC METABOLIC PANEL
Anion gap: 8 (ref 5–15)
BUN: 40 mg/dL — ABNORMAL HIGH (ref 8–23)
CO2: 35 mmol/L — ABNORMAL HIGH (ref 22–32)
Calcium: 8.8 mg/dL — ABNORMAL LOW (ref 8.9–10.3)
Chloride: 99 mmol/L (ref 98–111)
Creatinine, Ser: 0.99 mg/dL (ref 0.61–1.24)
GFR, Estimated: >60 mL/min (ref >60)
Glucose, Bld: 128 mg/dL — ABNORMAL HIGH (ref 70–99)
Potassium: 4.3 mmol/L (ref 3.5–5.1)
Sodium: 142 mmol/L (ref 135–145)

## 2023-08-12 LAB — PHOSPHORUS: Phosphorus: 3.8 mg/dL (ref 2.5–4.6)

## 2023-08-12 LAB — LACTIC ACID, PLASMA: Lactic Acid, Venous: 1.2 mmol/L (ref 0.5–1.9)

## 2023-08-12 LAB — MAGNESIUM: Magnesium: 2.3 mg/dL (ref 1.7–2.4)

## 2023-08-12 MED ORDER — IOHEXOL 350 MG/ML SOLN
100.0000 mL | Freq: Once | INTRAVENOUS | Status: AC | PRN
  Administered 2023-08-12: 100 mL via INTRAVENOUS

## 2023-08-12 NOTE — Telephone Encounter (Signed)
Please schedule pt an appt. Lv was cancelled 11/25

## 2023-08-13 ENCOUNTER — Inpatient Hospital Stay (HOSPITAL_COMMUNITY)
Admission: EM | Admit: 2023-08-13 | Discharge: 2023-10-02 | DRG: 166 | Disposition: E | Payer: Medicare Other | Attending: Critical Care Medicine | Admitting: Critical Care Medicine

## 2023-08-13 ENCOUNTER — Other Ambulatory Visit: Payer: Self-pay

## 2023-08-13 ENCOUNTER — Institutional Professional Consult (permissible substitution) (HOSPITAL_COMMUNITY): Payer: Medicare Other

## 2023-08-13 DIAGNOSIS — J9503 Malfunction of tracheostomy stoma: Secondary | ICD-10-CM | POA: Diagnosis not present

## 2023-08-13 DIAGNOSIS — Z7901 Long term (current) use of anticoagulants: Secondary | ICD-10-CM

## 2023-08-13 DIAGNOSIS — D696 Thrombocytopenia, unspecified: Secondary | ICD-10-CM | POA: Insufficient documentation

## 2023-08-13 DIAGNOSIS — K2289 Other specified disease of esophagus: Secondary | ICD-10-CM | POA: Diagnosis present

## 2023-08-13 DIAGNOSIS — K922 Gastrointestinal hemorrhage, unspecified: Secondary | ICD-10-CM | POA: Diagnosis not present

## 2023-08-13 DIAGNOSIS — J9601 Acute respiratory failure with hypoxia: Secondary | ICD-10-CM | POA: Diagnosis not present

## 2023-08-13 DIAGNOSIS — R578 Other shock: Secondary | ICD-10-CM | POA: Diagnosis not present

## 2023-08-13 DIAGNOSIS — J80 Acute respiratory distress syndrome: Principal | ICD-10-CM | POA: Diagnosis not present

## 2023-08-13 DIAGNOSIS — I340 Nonrheumatic mitral (valve) insufficiency: Secondary | ICD-10-CM | POA: Diagnosis present

## 2023-08-13 DIAGNOSIS — Y848 Other medical procedures as the cause of abnormal reaction of the patient, or of later complication, without mention of misadventure at the time of the procedure: Secondary | ICD-10-CM | POA: Diagnosis not present

## 2023-08-13 DIAGNOSIS — D6959 Other secondary thrombocytopenia: Secondary | ICD-10-CM | POA: Diagnosis present

## 2023-08-13 DIAGNOSIS — Z66 Do not resuscitate: Secondary | ICD-10-CM | POA: Diagnosis not present

## 2023-08-13 DIAGNOSIS — Z9049 Acquired absence of other specified parts of digestive tract: Secondary | ICD-10-CM

## 2023-08-13 DIAGNOSIS — F410 Panic disorder [episodic paroxysmal anxiety] without agoraphobia: Secondary | ICD-10-CM | POA: Diagnosis present

## 2023-08-13 DIAGNOSIS — Z79899 Other long term (current) drug therapy: Secondary | ICD-10-CM

## 2023-08-13 DIAGNOSIS — Z751 Person awaiting admission to adequate facility elsewhere: Secondary | ICD-10-CM

## 2023-08-13 DIAGNOSIS — K2981 Duodenitis with bleeding: Secondary | ICD-10-CM | POA: Diagnosis present

## 2023-08-13 DIAGNOSIS — J440 Chronic obstructive pulmonary disease with acute lower respiratory infection: Secondary | ICD-10-CM | POA: Diagnosis not present

## 2023-08-13 DIAGNOSIS — F32A Depression, unspecified: Secondary | ICD-10-CM | POA: Diagnosis present

## 2023-08-13 DIAGNOSIS — Z681 Body mass index (BMI) 19 or less, adult: Secondary | ICD-10-CM

## 2023-08-13 DIAGNOSIS — I351 Nonrheumatic aortic (valve) insufficiency: Secondary | ICD-10-CM | POA: Diagnosis present

## 2023-08-13 DIAGNOSIS — J810 Acute pulmonary edema: Secondary | ICD-10-CM | POA: Diagnosis not present

## 2023-08-13 DIAGNOSIS — R54 Age-related physical debility: Secondary | ICD-10-CM | POA: Diagnosis present

## 2023-08-13 DIAGNOSIS — K921 Melena: Secondary | ICD-10-CM | POA: Diagnosis present

## 2023-08-13 DIAGNOSIS — N179 Acute kidney failure, unspecified: Secondary | ICD-10-CM | POA: Diagnosis present

## 2023-08-13 DIAGNOSIS — R001 Bradycardia, unspecified: Secondary | ICD-10-CM | POA: Diagnosis not present

## 2023-08-13 DIAGNOSIS — Z930 Tracheostomy status: Secondary | ICD-10-CM | POA: Diagnosis not present

## 2023-08-13 DIAGNOSIS — R933 Abnormal findings on diagnostic imaging of other parts of digestive tract: Secondary | ICD-10-CM

## 2023-08-13 DIAGNOSIS — D638 Anemia in other chronic diseases classified elsewhere: Secondary | ICD-10-CM | POA: Diagnosis present

## 2023-08-13 DIAGNOSIS — L89150 Pressure ulcer of sacral region, unstageable: Secondary | ICD-10-CM | POA: Diagnosis present

## 2023-08-13 DIAGNOSIS — I468 Cardiac arrest due to other underlying condition: Secondary | ICD-10-CM | POA: Diagnosis not present

## 2023-08-13 DIAGNOSIS — K3189 Other diseases of stomach and duodenum: Secondary | ICD-10-CM | POA: Diagnosis present

## 2023-08-13 DIAGNOSIS — R739 Hyperglycemia, unspecified: Secondary | ICD-10-CM | POA: Diagnosis not present

## 2023-08-13 DIAGNOSIS — Z22322 Carrier or suspected carrier of Methicillin resistant Staphylococcus aureus: Secondary | ICD-10-CM

## 2023-08-13 DIAGNOSIS — E440 Moderate protein-calorie malnutrition: Secondary | ICD-10-CM | POA: Diagnosis present

## 2023-08-13 DIAGNOSIS — R627 Adult failure to thrive: Secondary | ICD-10-CM | POA: Diagnosis present

## 2023-08-13 DIAGNOSIS — Z931 Gastrostomy status: Secondary | ICD-10-CM

## 2023-08-13 DIAGNOSIS — J9610 Chronic respiratory failure, unspecified whether with hypoxia or hypercapnia: Secondary | ICD-10-CM | POA: Diagnosis not present

## 2023-08-13 DIAGNOSIS — Y95 Nosocomial condition: Secondary | ICD-10-CM | POA: Diagnosis not present

## 2023-08-13 DIAGNOSIS — A419 Sepsis, unspecified organism: Secondary | ICD-10-CM

## 2023-08-13 DIAGNOSIS — R059 Cough, unspecified: Secondary | ICD-10-CM

## 2023-08-13 DIAGNOSIS — Y828 Other medical devices associated with adverse incidents: Secondary | ICD-10-CM | POA: Diagnosis not present

## 2023-08-13 DIAGNOSIS — G7281 Critical illness myopathy: Secondary | ICD-10-CM | POA: Diagnosis not present

## 2023-08-13 DIAGNOSIS — Z794 Long term (current) use of insulin: Secondary | ICD-10-CM

## 2023-08-13 DIAGNOSIS — I10 Essential (primary) hypertension: Secondary | ICD-10-CM

## 2023-08-13 DIAGNOSIS — L270 Generalized skin eruption due to drugs and medicaments taken internally: Secondary | ICD-10-CM | POA: Diagnosis not present

## 2023-08-13 DIAGNOSIS — E876 Hypokalemia: Secondary | ICD-10-CM | POA: Diagnosis present

## 2023-08-13 DIAGNOSIS — J1569 Pneumonia due to other gram-negative bacteria: Secondary | ICD-10-CM | POA: Diagnosis not present

## 2023-08-13 DIAGNOSIS — I82409 Acute embolism and thrombosis of unspecified deep veins of unspecified lower extremity: Secondary | ICD-10-CM

## 2023-08-13 DIAGNOSIS — I110 Hypertensive heart disease with heart failure: Secondary | ICD-10-CM | POA: Diagnosis present

## 2023-08-13 DIAGNOSIS — G9341 Metabolic encephalopathy: Secondary | ICD-10-CM | POA: Diagnosis not present

## 2023-08-13 DIAGNOSIS — I080 Rheumatic disorders of both mitral and aortic valves: Secondary | ICD-10-CM | POA: Diagnosis present

## 2023-08-13 DIAGNOSIS — I480 Paroxysmal atrial fibrillation: Secondary | ICD-10-CM

## 2023-08-13 DIAGNOSIS — R21 Rash and other nonspecific skin eruption: Secondary | ICD-10-CM

## 2023-08-13 DIAGNOSIS — J9382 Other air leak: Secondary | ICD-10-CM | POA: Diagnosis not present

## 2023-08-13 DIAGNOSIS — I501 Left ventricular failure: Secondary | ICD-10-CM | POA: Diagnosis present

## 2023-08-13 DIAGNOSIS — E8729 Other acidosis: Secondary | ICD-10-CM | POA: Diagnosis present

## 2023-08-13 DIAGNOSIS — Z885 Allergy status to narcotic agent status: Secondary | ICD-10-CM

## 2023-08-13 DIAGNOSIS — Z85038 Personal history of other malignant neoplasm of large intestine: Secondary | ICD-10-CM

## 2023-08-13 DIAGNOSIS — Z86718 Personal history of other venous thrombosis and embolism: Secondary | ICD-10-CM

## 2023-08-13 DIAGNOSIS — R509 Fever, unspecified: Secondary | ICD-10-CM | POA: Diagnosis not present

## 2023-08-13 DIAGNOSIS — F429 Obsessive-compulsive disorder, unspecified: Secondary | ICD-10-CM | POA: Diagnosis present

## 2023-08-13 DIAGNOSIS — K5710 Diverticulosis of small intestine without perforation or abscess without bleeding: Secondary | ICD-10-CM | POA: Diagnosis present

## 2023-08-13 DIAGNOSIS — Z9911 Dependence on respirator [ventilator] status: Secondary | ICD-10-CM

## 2023-08-13 DIAGNOSIS — J398 Other specified diseases of upper respiratory tract: Secondary | ICD-10-CM | POA: Diagnosis not present

## 2023-08-13 DIAGNOSIS — R233 Spontaneous ecchymoses: Secondary | ICD-10-CM | POA: Diagnosis present

## 2023-08-13 DIAGNOSIS — R34 Anuria and oliguria: Secondary | ICD-10-CM | POA: Diagnosis not present

## 2023-08-13 DIAGNOSIS — D62 Acute posthemorrhagic anemia: Secondary | ICD-10-CM

## 2023-08-13 DIAGNOSIS — Z515 Encounter for palliative care: Secondary | ICD-10-CM

## 2023-08-13 DIAGNOSIS — R042 Hemoptysis: Secondary | ICD-10-CM

## 2023-08-13 DIAGNOSIS — I82721 Chronic embolism and thrombosis of deep veins of right upper extremity: Secondary | ICD-10-CM

## 2023-08-13 DIAGNOSIS — R17 Unspecified jaundice: Secondary | ICD-10-CM | POA: Diagnosis not present

## 2023-08-13 DIAGNOSIS — F05 Delirium due to known physiological condition: Secondary | ICD-10-CM | POA: Diagnosis not present

## 2023-08-13 DIAGNOSIS — R6521 Severe sepsis with septic shock: Secondary | ICD-10-CM

## 2023-08-13 DIAGNOSIS — L98429 Non-pressure chronic ulcer of back with unspecified severity: Secondary | ICD-10-CM

## 2023-08-13 DIAGNOSIS — T859XXA Unspecified complication of internal prosthetic device, implant and graft, initial encounter: Secondary | ICD-10-CM | POA: Diagnosis not present

## 2023-08-13 LAB — CBC
HCT: 35 % — ABNORMAL LOW (ref 39.0–52.0)
Hemoglobin: 10.9 g/dL — ABNORMAL LOW (ref 13.0–17.0)
MCH: 30.2 pg (ref 26.0–34.0)
MCHC: 31.1 g/dL (ref 30.0–36.0)
MCV: 97 fL (ref 80.0–100.0)
Platelets: 216 10*3/uL (ref 150–400)
RBC: 3.61 MIL/uL — ABNORMAL LOW (ref 4.22–5.81)
RDW: 16.7 % — ABNORMAL HIGH (ref 11.5–15.5)
WBC: 17.6 10*3/uL — ABNORMAL HIGH (ref 4.0–10.5)
nRBC: 0.2 % (ref 0.0–0.2)

## 2023-08-13 LAB — CBC WITH DIFFERENTIAL/PLATELET
Abs Immature Granulocytes: 0.18 10*3/uL — ABNORMAL HIGH (ref 0.00–0.07)
Basophils Absolute: 0.1 10*3/uL (ref 0.0–0.1)
Basophils Relative: 0 %
Eosinophils Absolute: 0.2 10*3/uL (ref 0.0–0.5)
Eosinophils Relative: 1 %
HCT: 32.6 % — ABNORMAL LOW (ref 39.0–52.0)
Hemoglobin: 10.1 g/dL — ABNORMAL LOW (ref 13.0–17.0)
Immature Granulocytes: 1 %
Lymphocytes Relative: 17 %
Lymphs Abs: 2.6 10*3/uL (ref 0.7–4.0)
MCH: 30.1 pg (ref 26.0–34.0)
MCHC: 31 g/dL (ref 30.0–36.0)
MCV: 97 fL (ref 80.0–100.0)
Monocytes Absolute: 1.4 10*3/uL — ABNORMAL HIGH (ref 0.1–1.0)
Monocytes Relative: 9 %
Neutro Abs: 11.4 10*3/uL — ABNORMAL HIGH (ref 1.7–7.7)
Neutrophils Relative %: 72 %
Platelets: 212 10*3/uL (ref 150–400)
RBC: 3.36 MIL/uL — ABNORMAL LOW (ref 4.22–5.81)
RDW: 16.6 % — ABNORMAL HIGH (ref 11.5–15.5)
WBC: 15.8 10*3/uL — ABNORMAL HIGH (ref 4.0–10.5)
nRBC: 0.3 % — ABNORMAL HIGH (ref 0.0–0.2)

## 2023-08-13 LAB — I-STAT ARTERIAL BLOOD GAS, ED
Acid-Base Excess: 14 mmol/L — ABNORMAL HIGH (ref 0.0–2.0)
Bicarbonate: 39.1 mmol/L — ABNORMAL HIGH (ref 20.0–28.0)
Calcium, Ion: 1.16 mmol/L (ref 1.15–1.40)
HCT: 26 % — ABNORMAL LOW (ref 39.0–52.0)
Hemoglobin: 8.8 g/dL — ABNORMAL LOW (ref 13.0–17.0)
O2 Saturation: 97 %
Patient temperature: 37
Potassium: 3.2 mmol/L — ABNORMAL LOW (ref 3.5–5.1)
Sodium: 142 mmol/L (ref 135–145)
TCO2: 41 mmol/L — ABNORMAL HIGH (ref 22–32)
pCO2 arterial: 50 mm[Hg] — ABNORMAL HIGH (ref 32–48)
pH, Arterial: 7.501 — ABNORMAL HIGH (ref 7.35–7.45)
pO2, Arterial: 90 mm[Hg] (ref 83–108)

## 2023-08-13 LAB — COMPREHENSIVE METABOLIC PANEL
ALT: 15 U/L (ref 0–44)
AST: 20 U/L (ref 15–41)
Albumin: 1.6 g/dL — ABNORMAL LOW (ref 3.5–5.0)
Alkaline Phosphatase: 42 U/L (ref 38–126)
Anion gap: 10 (ref 5–15)
BUN: 55 mg/dL — ABNORMAL HIGH (ref 8–23)
CO2: 33 mmol/L — ABNORMAL HIGH (ref 22–32)
Calcium: 8.5 mg/dL — ABNORMAL LOW (ref 8.9–10.3)
Chloride: 98 mmol/L (ref 98–111)
Creatinine, Ser: 1.52 mg/dL — ABNORMAL HIGH (ref 0.61–1.24)
GFR, Estimated: 49 mL/min — ABNORMAL LOW (ref >60)
Glucose, Bld: 128 mg/dL — ABNORMAL HIGH (ref 70–99)
Potassium: 3.8 mmol/L (ref 3.5–5.1)
Sodium: 141 mmol/L (ref 135–145)
Total Bilirubin: 1.1 mg/dL (ref <1.2)
Total Protein: 5 g/dL — ABNORMAL LOW (ref 6.5–8.1)

## 2023-08-13 LAB — APTT: aPTT: 32 s (ref 24–36)

## 2023-08-13 LAB — PROTIME-INR
INR: 1.3 — ABNORMAL HIGH (ref 0.8–1.2)
Prothrombin Time: 16.7 s — ABNORMAL HIGH (ref 11.4–15.2)

## 2023-08-13 LAB — D-DIMER, QUANTITATIVE: D-Dimer, Quant: 16.8 ug{FEU}/mL — ABNORMAL HIGH (ref 0.00–0.50)

## 2023-08-13 MED ORDER — ZOLPIDEM TARTRATE 5 MG PO TABS
5.0000 mg | ORAL_TABLET | Freq: Every evening | ORAL | Status: DC | PRN
  Administered 2023-08-15: 5 mg via ORAL
  Filled 2023-08-13: qty 1

## 2023-08-13 MED ORDER — LORAZEPAM 2 MG/ML IJ SOLN
1.0000 mg | Freq: Three times a day (TID) | INTRAMUSCULAR | Status: DC | PRN
  Administered 2023-08-14 – 2023-08-18 (×7): 1 mg via INTRAVENOUS
  Filled 2023-08-13 (×6): qty 1

## 2023-08-13 MED ORDER — OXYCODONE HCL 5 MG PO TABS: 5.0000 mg | ORAL_TABLET | ORAL | Status: DC | PRN

## 2023-08-13 MED ORDER — LACTATED RINGERS IV SOLN: INTRAVENOUS | Status: DC

## 2023-08-13 MED ORDER — AMIODARONE HCL 200 MG PO TABS
200.0000 mg | ORAL_TABLET | Freq: Every day | ORAL | Status: DC
  Administered 2023-08-15 – 2023-09-15 (×32): 200 mg
  Filled 2023-08-13 (×32): qty 1

## 2023-08-13 MED ORDER — IOHEXOL 350 MG/ML SOLN
75.0000 mL | Freq: Once | INTRAVENOUS | Status: AC | PRN
  Administered 2023-08-13: 75 mL via INTRAVENOUS

## 2023-08-13 MED ORDER — IPRATROPIUM-ALBUTEROL 0.5-2.5 (3) MG/3ML IN SOLN
3.0000 mL | RESPIRATORY_TRACT | Status: DC | PRN
  Administered 2023-08-24 – 2023-08-31 (×4): 3 mL via RESPIRATORY_TRACT
  Filled 2023-08-13 (×4): qty 3

## 2023-08-13 MED ORDER — PANTOPRAZOLE SODIUM 40 MG IV SOLR
40.0000 mg | Freq: Two times a day (BID) | INTRAVENOUS | Status: DC
  Administered 2023-08-14 – 2023-09-15 (×65): 40 mg via INTRAVENOUS
  Filled 2023-08-13 (×65): qty 10

## 2023-08-13 MED ORDER — ACETAMINOPHEN 325 MG PO TABS: 650.0000 mg | ORAL_TABLET | Freq: Four times a day (QID) | ORAL | Status: DC | PRN

## 2023-08-13 MED ORDER — SERTRALINE HCL 100 MG PO TABS
100.0000 mg | ORAL_TABLET | Freq: Every day | ORAL | Status: DC
  Administered 2023-08-15: 100 mg via ORAL
  Filled 2023-08-13: qty 1

## 2023-08-13 NOTE — Assessment & Plan Note (Signed)
-   Patient has been noted to have bloody aspirations from his tracheostomy for the past week.  Bronchoscopy was discussed at [facility] but family was documented to have deferred until further investigation of GI bleed which is reasonable.

## 2023-08-13 NOTE — Assessment & Plan Note (Signed)
Holding diuretic and hydralazine with active GI bleed to avoid hypotension

## 2023-08-13 NOTE — Assessment & Plan Note (Signed)
-   Patient deemed not a surgical candidate due to overall frailty.  This was diagnosed in September after he presented to [facility] for pneumonia complicated by RDS and acute cardiogenic pulmonary edema.

## 2023-08-13 NOTE — ED Provider Notes (Signed)
[place] EMERGENCY DEPARTMENT AT [facility] Provider Note   CSN: [id number] Arrival date & time: 08/13/23  1757     History  No chief complaint on file.   Joshua Eaton is a 67 y.o. male who is ventilator dependent with history of anxiety, depression, hypertension, cancer who [facility] due to concern for GI bleed.  Patient reportedly with drop in hemoglobin, with recent transfusion but still dropping.  Notably however did have CBC drawn today, hemoglobin rater than 10 which is significantly improved from recent at around 7.5.  Reportedly sent from GI for possible endoscopy, and Hemoccult evaluation of the PEG tube.  HPI     Home Medications Prior to Admission medications   Medication Sig Start Date End Date Taking? Authorizing Provider  acetaminophen (TYLENOL) 325 MG tablet Take 650 mg by mouth every 6 (six) hours as needed for moderate pain.    [provider]  amiodarone (PACERONE) 200 MG tablet Place 1 tablet (200 mg total) into feeding tube daily. 07/14/23   Rocky Morel, DO  apixaban (ELIQUIS) 5 MG TABS tablet Place 1 tablet (5 mg total) into feeding tube 2 (two) times daily. 06/28/23   Rhetta Mura, MD  Chlorhexidine Gluconate Cloth 2 % PADS Apply 6 each topically daily. 07/14/23   Rocky Morel, DO  clonazePAM (KLONOPIN) 0.5 MG tablet Place 1 tablet (0.5 mg total) into feeding tube 2 (two) times daily. 07/13/23   Rocky Morel, DO  dexmedetomidine (PRECEDEX) 400 MCG/100ML SOLN Inject 0-114.72 mcg/hr into the vein continuous. 07/13/23   Rocky Morel, DO  dextrose (GLUTOSE) 40 % GEL Take 15 g by mouth daily as needed for low blood sugar (70mg /dL).    [provider]  docusate (COLACE) 50 MG/5ML liquid Place 10 mLs (100 mg total) into feeding tube 2 (two) times daily. 07/13/23   Rocky Morel, DO  Electrolyte-A in Dextrose (DEXTROSE 50%/ELECTROLYTES IV) Inject 25 g into the vein daily as needed (blood sugar  50mg /dL).    [provider]  famotidine (PEPCID) 20 MG tablet Place 1 tablet (20 mg total) into feeding tube 2 (two) times daily. 07/13/23   Rocky Morel, DO  fentaNYL (SUBLIMAZE) 50 MCG/ML injection Inject 0.25-0.5 mLs (12.5-25 mcg total) into the vein every hour as needed for moderate pain (pain score 4-6) (severe vent dysscynchrony). 07/13/23   Rocky Morel, DO  furosemide (LASIX) 20 MG tablet Place 2 tablets (40 mg total) into feeding tube daily. 07/13/23   Rocky Morel, DO  Glucagon HCl 1 MG SOLR Inject 1 mg as directed once as needed (hypoglycemia).    [provider]  hydrALAZINE (APRESOLINE) 10 MG tablet Place 10 mg into feeding tube every 8 (eight) hours.    [provider]  insulin aspart (NOVOLOG) 100 UNIT/ML injection Inject 0-15 Units into the skin every 4 (four) hours. 07/13/23   Rocky Morel, DO  ipratropium-albuterol (DUONEB) 0.5-2.5 (3) MG/3ML SOLN Take 3 mLs by nebulization every 4 (four) hours as needed (sob/wheezing).    [provider]  lip balm (CARMEX) ointment Apply topically as needed. 07/13/23   Rocky Morel, DO  magnesium oxide (MAG-OX) 400 (240 Mg) MG tablet Take 400 mg by mouth every 6 (six) hours as needed (magnesium electrolyte replacement).    [provider]  Mouthwashes (MOUTH RINSE) LIQD solution 15 mLs by Mouth Rinse route as needed (oral care). 07/13/23   Rocky Morel, DO  Mouthwashes (MOUTH RINSE) LIQD solution 15 mLs by Mouth Rinse route 4 (four)  times daily. 07/13/23   Rocky Morel, DO  Multiple Vitamin (MULTIVITAMIN WITH MINERALS) TABS tablet Place 1 tablet into feeding tube daily. 07/14/23   Rocky Morel, DO  nutrition supplement, JUVEN, (JUVEN) PACK Place 1 packet into feeding tube 2 (two) times daily between meals. 07/13/23   Rocky Morel, DO  Nutritional Supplements (FEEDING SUPPLEMENT, JEVITY 1.5 CAL/FIBER,) LIQD Place 1,000 mLs into feeding tube continuous. 07/13/23   Rocky Morel, DO  polyethylene glycol (MIRALAX / GLYCOLAX) 17 g packet Place 17 g into feeding tube daily. 07/14/23   Rocky Morel, DO  polyethylene glycol (MIRALAX / GLYCOLAX) 17 g packet Place 17 g into feeding tube daily as needed for moderate constipation. 07/13/23   Rocky Morel, DO  potassium chloride SA (KLOR-CON M) 20 MEQ tablet Take 1 tablet (20 mEq total) by mouth daily. Or every 4 hours as needed for potassium electrolyte replacement. 07/13/23   Rocky Morel, DO  Protein (FEEDING SUPPLEMENT, PROSOURCE TF20,) liquid Place 60 mLs into feeding tube daily. 07/14/23   Rocky Morel, DO  QUEtiapine (SEROQUEL) 100 MG tablet Place 1 tablet (100 mg total) into feeding tube at bedtime. 07/13/23   Rocky Morel, DO  QUEtiapine (SEROQUEL) 50 MG tablet Place 0.5 tablets (25 mg total) into feeding tube every morning. 07/14/23   Rocky Morel, DO  sertraline (ZOLOFT) 100 MG tablet TAKE 2 TABLETS BY MOUTH IN THE  MORNING AND 1 TABLET BY MOUTH IN THE EVENING 08/12/23   Cottle, Steva Ready., MD  simethicone (MYLICON) 80 MG chewable tablet Place 80 mg into feeding tube 4 (four) times daily.    [provider]  sodium chloride HYPERTONIC 3 % nebulizer solution Take 4 mLs by nebulization 2 (two) times daily. 07/13/23   Rocky Morel, DO  vancomycin (VANCOREADY) 1250 MG/250ML SOLN Inject 250 mLs (1,250 mg total) into the vein daily. 07/13/23   Rocky Morel, DO      Allergies    Codeine    Review of Systems   Review of Systems  All other systems reviewed and are negative.   Physical Exam Updated Vital Signs BP (!) 116/49   Pulse 80   SpO2 100%  Physical Exam Vitals and nursing note reviewed.  Constitutional:      General: He is not in acute distress.    Appearance: Normal appearance. He is ill-appearing.     Comments: Chronically ill appearing, not any acute distress  HENT:     Head: Normocephalic and atraumatic.     Mouth/Throat:     Comments: Tracheostomy tube in place,  on ventilator Eyes:     General:        Right eye: No discharge.        Left eye: No discharge.  Cardiovascular:     Rate and Rhythm: Normal rate and regular rhythm.     Heart sounds: No murmur heard.    No friction rub. No gallop.  Pulmonary:     Effort: Pulmonary effort is normal.     Breath sounds: Normal breath sounds.  Abdominal:     General: Bowel sounds are normal.     Palpations: Abdomen is soft.     Comments: PEG tube in place and abdomen, no gross blood from PEG at this time  Skin:    General: Skin is warm and dry.     Capillary Refill: Capillary refill takes less than 2 seconds.  Neurological:     Mental Status: He is alert and oriented to person, place, and time.  Psychiatric:        Mood and Affect: Mood normal.        Behavior: Behavior normal.     ED Results / Procedures / Treatments   Labs (all labs ordered are listed, but only abnormal results are displayed) Labs Reviewed  BLOOD GAS, ARTERIAL  TYPE AND SCREEN    EKG None  Radiology CT Angio Chest Pulmonary Embolism (PE) W or WO Contrast Result Date: 08/13/2023 CLINICAL DATA:  Shortness of breath, hemoptysis. EXAM: CT ANGIOGRAPHY CHEST WITH CONTRAST TECHNIQUE: Multidetector CT imaging of the chest was performed using the standard protocol during bolus administration of intravenous contrast. Multiplanar CT image reconstructions and MIPs were obtained to evaluate the vascular anatomy. RADIATION DOSE REDUCTION: This exam was performed according to the departmental dose-optimization program which includes automated exposure control, adjustment of the mA and/or kV according to patient size and/or use of iterative reconstruction technique. CONTRAST:  75mL OMNIPAQUE IOHEXOL 350 MG/ML SOLN COMPARISON:  08/04/2023 FINDINGS: Cardiovascular: No filling defects in the pulmonary arteries to suggest pulmonary emboli. Cardiomegaly. Aorta normal caliber. Mediastinum/Nodes: No mediastinal, hilar, or axillary adenopathy.  Tracheostomy tube in the midtrachea, unchanged. Thyroid and esophagus unremarkable. Lungs/Pleura: Improving bilateral airspace disease. Small left effusion. Bronchiectasis in the lower lobes with dependent airspace opacities, left greater than right. Scattered ground-glass opacities throughout the lungs. Upper Abdomen: Perihepatic and perisplenic ascites. Musculoskeletal: Chest wall soft tissues are unremarkable. No acute bony abnormality. Review of the MIP images confirms the above findings. IMPRESSION: No evidence of pulmonary embolus. Cardiomegaly. Improving bilateral airspace disease. Continued ground-glass opacities which could reflect edema and bibasilar airspace opacities, left greater than right, atelectasis versus infiltrates. Small left effusion. Small amount of upper abdominal ascites. Aortic Atherosclerosis (ICD10-I70.0). Electronically Signed   By: Charlett Nose M.D.   On: 08/13/2023 02:35   CT ANGIO GI BLEED Result Date: 08/12/2023 CLINICAL DATA:  GI bleed, shortness of breath EXAM: CTA ABDOMEN AND PELVIS WITHOUT AND WITH CONTRAST TECHNIQUE: Multidetector CT imaging of the abdomen and pelvis was performed using the standard protocol during bolus administration of intravenous contrast. Multiplanar reconstructed images and MIPs were obtained and reviewed to evaluate the vascular anatomy. RADIATION DOSE REDUCTION: This exam was performed according to the departmental dose-optimization program which includes automated exposure control, adjustment of the mA and/or kV according to patient size and/or use of iterative reconstruction technique. CONTRAST:  OMNIPAQUE IOHEXOL 350 MG/ML SOLN COMPARISON:  Partial comparison to CT chest dated 08/14/2023. CT abdomen/pelvis dated 07/02/2023. FINDINGS: Motion degraded images. Lower chest: Small left pleural effusion with associated left lower lobe opacity, atelectasis versus pneumonia. Additional ground-glass opacities in the visualized lungs, mildly improved,  favoring mild interstitial edema. Hepatobiliary: Liver is within normal limits. Status post cholecystectomy. No intrahepatic or extrahepatic dilatation. Pancreas: Within normal limits. Spleen: Within normal limits. Adrenals/Urinary Tract: Adrenal glands are within normal limits. Kidneys are within normal limits.  No hydronephrosis. Bladder is within normal limits. Stomach/Bowel: Percutaneous gastrostomy in satisfactory position. Following contrast administration, there is intraluminal spillage of contrast along the posterior gastric antrum and pylorus (series 7/images 66-70), suggesting the site of active GI bleeding. Secondary inflammatory changes involving the 3rd/4th portion of the duodenum (series 7/image 13), suggesting duodenitis. No evidence of bowel obstruction. Status post right hemicolectomy with appendectomy. Mild sigmoid diverticulosis, without evidence of diverticulitis. Vascular/Lymphatic: No evidence of abdominal aortic aneurysm. Atherosclerotic calcifications of the abdominal aorta and branch vessels, although vessels remain patent. No suspicious abdominopelvic lymphadenopathy. Reproductive: Prostate is unremarkable. Other: Trace right pelvic ascites.  Musculoskeletal: Mild degenerative changes of the lumbar spine. IMPRESSION: Motion degraded images. Suspected active GI bleeding along the posterior gastric antrum/pylorus, as above. Percutaneous gastrostomy in satisfactory position. Secondary inflammatory changes involving the 3rd/4th portion of the duodenum, suggesting duodenitis. Small left pleural effusion with associated left lower lobe opacity, atelectasis versus pneumonia. Additional ground-glass opacities in the visualized lungs, mildly improved, favoring mild interstitial edema. Electronically Signed   By: Charline Bills M.D.   On: 08/12/2023 18:59   DG Abd 1 View Result Date: 08/12/2023 CLINICAL DATA:  [id number] with abdominal distention. EXAM: ABDOMEN - 1 VIEW COMPARISON:  Study of  07/19/2019 FINDINGS: The bowel gas pattern is nonobstructive. A PEG tube is again noted in the left upper abdomen. There is scoliosis and degenerative change of the spine. No supine evidence of free air. No radio-opaque calculi or other significant radiographic abnormality are seen. IMPRESSION: Nonobstructive bowel gas pattern. PEG tube. No acute radiographic findings. Electronically Signed   By: Almira Bar M.D.   On: 08/12/2023 06:41    Procedures Procedures    Medications Ordered in ED Medications  pantoprazole (PROTONIX) injection 40 mg (has no administration in time range)  ipratropium-albuterol (DUONEB) 0.5-2.5 (3) MG/3ML nebulizer solution 3 mL (has no administration in time range)    ED Course/ Medical Decision Making/ A&P Clinical Course as of 08/13/23 2006  Fri Aug 13, 2023  1840 armbruster [CP]    Clinical Course User Index [CP] Olene Floss, PA-C                                 Medical Decision Making Risk Prescription drug management.   This patient is a 67 y.o. male  who presents to the ED for concern of concern for GI bleed from longterm care upstairs.   Differential diagnoses prior to evaluation: The emergent differential diagnosis includes, but is not limited to, active GI bleed, vs other cause of anemia vs spurious CT read from yesterday . This is not an exhaustive differential.   Past Medical History / Co-morbidities / Social History: anxiety, depression, hypertension, cancer  Additional history: Chart reviewed. Pertinent results include: Reviewed recent lab work, imaging, notably with CT angio GI bleed completed yesterday which showed suspicion for possible GI bleed in the stomach, lab work from today showed hemoglobin which dropped from 10.8-10.1, his BUN also increased by 15 points, increasing concern for possible active GI bleed.  Physical Exam: Physical exam performed. The pertinent findings include: Overall well-appearing other than his  chronic medical conditions, his blood pressure is stable at 112/51, he is on the ventilator, no active bleeding at this time  Lab Tests/Imaging studies: I personally interpreted labs/imaging and the pertinent results include: As discussed reviewed lab work drawn outside the emergency department showing decrease in 12 hours of hemoglobin by 0.8, increase in BUN by 15.  As discussed above reviewed imaging study performed yesterday which showed possible upper GI bleed, reviewed PE study earlier today which showed no evidence of pulmonary embolus I agree with the radiologist interpretation.   Medications: I ordered medication including encourage discontinuation of blood thinner, patient had been taking Eliquis for DVT prophylaxis reportedly, will start 40 mg Protonix twice daily per GI, and patient will remain n.p.o. at this time.  I have reviewed the patients home medicines and have made adjustments as needed.  Consults: Spoke with Dr. Adela Lank with GI who after reviewing patient's clinical exam findings, history, imaging,  lab work recommends admission for GI evaluation, possible endoscopy, request that he be made n.p.o. at this time, I spoke with the hospitalist, Dr. Cyndia Bent who agrees to admission for GI bleed, spoke with the critical care specialist, Dr. Judeth Horn who agrees to help manage the patient's event   Disposition: After consideration of the diagnostic results and the patients response to treatment, I feel that patient would benefit from admission as above Final Clinical Impression(s) / ED Diagnoses Final diagnoses:  Gastrointestinal hemorrhage, unspecified gastrointestinal hemorrhage type    Rx / DC Orders ED Discharge Orders     None         [place] 08/13/23 2006    Rondel Baton, MD 08/14/23 [phone]

## 2023-08-13 NOTE — Assessment & Plan Note (Addendum)
-  Pt with PEG dependency.  Also has history of right hemicolectomy secondary to colon cancer.  Had CT abdomen pelvis yesterday at [facility] demonstrating suspected active GI bleed along the posterior antrum/pylorus.  Hemoglobin yesterday down at 8.7 from 9.2.  Today hemoglobin appears hemoconcentrated at 10.9. Pt reportedly also received a blood transfusion earlier this week.  -Will type and cross.  Administer gentle IV fluids overnight.  Goal of transfusion of hemoglobin less than 7. -GI Dr. Adela Lank consulted and will see in consultation in the morning -Continue IV PPI every 12HR -Holding continuous tube feeds overnight pending further GI workup. He is on gentle IV fluid hydration overnight.

## 2023-08-13 NOTE — Assessment & Plan Note (Signed)
-   Patient with unstageable sacral decubitus ulcer - Recently underwent debridement on 12/11 - Wound care consult

## 2023-08-13 NOTE — Progress Notes (Signed)
RT received pt via [facility] and placed on servo ventilator. Pt was put on previous vent settings that [facility] had for pt. PRVC 470/R20/40%/+5. Pt is tolerating well at this time.

## 2023-08-13 NOTE — Assessment & Plan Note (Signed)
-   History of recent RUE DVT provoked by PICC on 06/16/2023 -Eliquis currently on hold due to active GI bleed

## 2023-08-13 NOTE — Assessment & Plan Note (Addendum)
-   Dietitian consult although continuous tube feed on hold due to active GI bleed

## 2023-08-13 NOTE — Assessment & Plan Note (Signed)
-  has annular Large targetoid erythematous lesion on anterior left patella. Family reports lyme workup has been negative. Unable to view records of testing.  -will continue to monitor

## 2023-08-13 NOTE — ED Triage Notes (Signed)
Pt transferred from [facility] and discharged from there

## 2023-08-13 NOTE — Assessment & Plan Note (Addendum)
-   Eliquis appears to have been held during last hospitalization due to severe anemia and hemoptysis.  Family unclear if this was restarted.  Based on limited med reconciliation paperwork it appears that he has not been on Eliquis.  Will continue to hold due to active GI bleed. -Continue amiodarone

## 2023-08-13 NOTE — Consult Note (Signed)
NAME:  Joshua Eaton, MRN:  [id number], DOB:  Apr 12, 1956, LOS: 0 ADMISSION DATE:  08/13/2023, CONSULTATION DATE:  12/13 REFERRING MD:  Dr. Eloise Harman, CHIEF COMPLAINT:  GIB   History of Present Illness:  Patient is a 67 yo M w/ pertinent PMH chronic respiratory failure w/ tracheostomy/[facility], Afib on eliquis presents to [facility] ED on 12/13 w/ GIB.  Patient recently admitted w/ respiratory failure and pna in October 2024 requiring tracheostomy. He was later [facility]. Later that day had an accidental trach dislodgement. His wob continued to increase requiring re-admission to [facility] on 10/31 and was intubated and later trached during hospital admission. Patient discharged [facility].   On 12/13 patient admitted to [facility] w/ drop in hgb and concern for GIB. GI consulted for possible endoscopy. TRH to admit and PCCM to consult for vent management.  Pertinent  Medical History   Past Medical History:  Diagnosis Date   Anxiety    Cancer (HCC)    Depression    Hypertension    OCD (obsessive compulsive disorder)      Significant Hospital Events: Including procedures, antibiotic start and stop dates in addition to other pertinent events   12/13 admitted w/ GIB. Pccm to consult for vent management  Interim History / Subjective:  See above  Objective   Blood pressure (!) 116/49, pulse 80, SpO2 100%.    Vent Mode: PRVC FiO2 (%):  [40 %] 40 % Set Rate:  [20 bmp] 20 bmp Vt Set:  [470 mL] 470 mL PEEP:  [5 cmH20] 5 cmH20 Plateau Pressure:  [15 cmH20] 15 cmH20  No intake or output data in the 24 hours ending 08/13/23 1919 There were no vitals filed for this visit.  Examination: General:  chronically ill appearing on mech vent HEENT: MM pink/moist; trach in place Neuro: Altered; moving spontaneously CV: s1s2, no m/r/g PULM:  dim clear BS bilaterally; xlt trach in place GI: soft, peg tube in place Extremities:  warm/dry Skin: no rashes or lesions    Resolved Hospital Problem list     Assessment & Plan:  Chronic respiratory failure  Tracheostomy dependent  Plan: -cont [facility] -Wean PEEP/FiO2 for SpO2 >92% -VAP bundle in place -tracheostomy care per protocol -given patient is more altered than baseline will check abg -duoneb prn for wheezing  Possible GIB Afib on eliquis 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 Moderate AI Anxiety  RUE provoked DVT from picc line HTN Plan: -per primary and GI   Best Practice (right click and "Reselect all SmartList Selections" daily)   Per primary  Labs   CBC: Recent Labs  Lab 08/08/23 0347 08/09/23 0335 08/12/23 0316 08/13/23 0711 08/13/23 1732  WBC 10.0 11.1* 13.5* 17.6* 15.8*  NEUTROABS  --  8.5* 11.6*  --  11.4*  HGB 7.6* 9.2* 8.7* 10.9* 10.1*  HCT 24.4* 28.8* 28.2* 35.0* 32.6*  MCV 96.8 96.0 96.9 97.0 97.0  PLT 145* 160 170 216 212    Basic Metabolic Panel: Recent Labs  Lab 08/08/23 0347 08/12/23 0316 08/13/23 0936  NA 140 142 141  K 3.7 4.3 3.8  CL 99 99 98  CO2 33* 35* 33*  GLUCOSE 133* 128* 128*  BUN 24* 40* 55*  CREATININE 0.86 0.99 1.52*  CALCIUM 8.5* 8.8* 8.5*  MG 2.3 2.3  --   PHOS  --  3.8  --    GFR: CrCl cannot be calculated (Unknown ideal weight.).  Recent Labs  Lab 08/09/23 0335 08/12/23 0316 08/12/23 1828 08/13/23 0711 08/13/23 1732  WBC 11.1* 13.5*  --  17.6* 15.8*  LATICACIDVEN  --   --  1.2  --   --     Liver Function Tests: Recent Labs  Lab 08/13/23 0936  AST 20  ALT 15  ALKPHOS 42  BILITOT 1.1  PROT 5.0*  ALBUMIN 1.6*   No results for input(s): "LIPASE", "AMYLASE" in the last 168 hours. No results for input(s): "AMMONIA" in the last 168 hours.  ABG    Component Value Date/Time   PHART 7.36 07/02/2023 0145   PCO2ART 46 07/02/2023 0145   PO2ART 120 (H) 07/02/2023 0145   HCO3 26.6 07/02/2023 0145   TCO2 27 07/01/2023 2203   ACIDBASEDEF 2.0 07/01/2023  2203   O2SAT 99.5 07/02/2023 0145     Coagulation Profile: Recent Labs  Lab 08/13/23 0936  INR 1.3*    Cardiac Enzymes: No results for input(s): "CKTOTAL", "CKMB", "CKMBINDEX", "TROPONINI" in the last 168 hours.  HbA1C: Hgb A1c MFr Bld  Date/Time Value Ref Range Status  07/14/2023 04:42 AM 4.3 (L) 4.8 - 5.6 % Final    Comment:    (NOTE) Pre diabetes:          5.7%-6.4%  Diabetes:              >6.4%  Glycemic control for   <7.0% adults with diabetes   06/30/2023 04:58 AM 4.1 (L) 4.8 - 5.6 % Final    Comment:    (NOTE) Pre diabetes:          5.7%-6.4%  Diabetes:              >6.4%  Glycemic control for   <7.0% adults with diabetes     CBG: No results for input(s): "GLUCAP" in the last 168 hours.  Review of Systems:   Patient is trached on ventilator; therefore, history has been obtained from chart review.    Past Medical History:  He,  has a past medical history of Anxiety, Cancer (HCC), Depression, Hypertension, and OCD (obsessive compulsive disorder).   Surgical History:   Past Surgical History:  Procedure Laterality Date   CHOLECYSTECTOMY     COLON SURGERY     cancerous polyp removed   HERNIA REPAIR     IR GASTROSTOMY TUBE MOD SED  06/24/2023     Social History:   reports that he has never smoked. He has never used smokeless tobacco. He reports that he does not drink alcohol and does not use drugs.   Family History:  His family history is not on file.   Allergies Allergies  Allergen Reactions   Codeine     "sends me into orbit"     Home Medications  Prior to Admission medications   Medication Sig Start Date End Date Taking? Authorizing Provider  acetaminophen (TYLENOL) 325 MG tablet Take 650 mg by mouth every 6 (six) hours as needed for moderate pain.    [provider]  amiodarone (PACERONE) 200 MG tablet Place 1 tablet (200 mg total) into feeding tube daily. 07/14/23   Rocky Morel, DO  apixaban (ELIQUIS) 5 MG TABS tablet  Place 1 tablet (5 mg total) into feeding tube 2 (two) times daily. 06/28/23   Rhetta Mura, MD  Chlorhexidine Gluconate Cloth 2 % PADS Apply 6 each topically daily. 07/14/23   Rocky Morel, DO  clonazePAM (KLONOPIN) 0.5 MG tablet Place 1 tablet (0.5 mg total) into feeding tube 2 (  two) times daily. 07/13/23   Rocky Morel, DO  dexmedetomidine (PRECEDEX) 400 MCG/100ML SOLN Inject 0-114.72 mcg/hr into the vein continuous. 07/13/23   Rocky Morel, DO  dextrose (GLUTOSE) 40 % GEL Take 15 g by mouth daily as needed for low blood sugar (70mg /dL).    [provider]  docusate (COLACE) 50 MG/5ML liquid Place 10 mLs (100 mg total) into feeding tube 2 (two) times daily. 07/13/23   Rocky Morel, DO  Electrolyte-A in Dextrose (DEXTROSE 50%/ELECTROLYTES IV) Inject 25 g into the vein daily as needed (blood sugar 50mg /dL).    [provider]  famotidine (PEPCID) 20 MG tablet Place 1 tablet (20 mg total) into feeding tube 2 (two) times daily. 07/13/23   Rocky Morel, DO  fentaNYL (SUBLIMAZE) 50 MCG/ML injection Inject 0.25-0.5 mLs (12.5-25 mcg total) into the vein every hour as needed for moderate pain (pain score 4-6) (severe vent dysscynchrony). 07/13/23   Rocky Morel, DO  furosemide (LASIX) 20 MG tablet Place 2 tablets (40 mg total) into feeding tube daily. 07/13/23   Rocky Morel, DO  Glucagon HCl 1 MG SOLR Inject 1 mg as directed once as needed (hypoglycemia).    [provider]  hydrALAZINE (APRESOLINE) 10 MG tablet Place 10 mg into feeding tube every 8 (eight) hours.    [provider]  insulin aspart (NOVOLOG) 100 UNIT/ML injection Inject 0-15 Units into the skin every 4 (four) hours. 07/13/23   Rocky Morel, DO  ipratropium-albuterol (DUONEB) 0.5-2.5 (3) MG/3ML SOLN Take 3 mLs by nebulization every 4 (four) hours as needed (sob/wheezing).    [provider]  lip balm (CARMEX) ointment Apply topically as needed. 07/13/23   Rocky Morel, DO  magnesium oxide (MAG-OX) 400 (240 Mg) MG tablet Take 400 mg by mouth every 6 (six) hours as needed (magnesium electrolyte replacement).    [provider]  Mouthwashes (MOUTH RINSE) LIQD solution 15 mLs by Mouth Rinse route as needed (oral care). 07/13/23   Rocky Morel, DO  Mouthwashes (MOUTH RINSE) LIQD solution 15 mLs by Mouth Rinse route 4 (four) times daily. 07/13/23   Rocky Morel, DO  Multiple Vitamin (MULTIVITAMIN WITH MINERALS) TABS tablet Place 1 tablet into feeding tube daily. 07/14/23   Rocky Morel, DO  nutrition supplement, JUVEN, (JUVEN) PACK Place 1 packet into feeding tube 2 (two) times daily between meals. 07/13/23   Rocky Morel, DO  Nutritional Supplements (FEEDING SUPPLEMENT, JEVITY 1.5 CAL/FIBER,) LIQD Place 1,000 mLs into feeding tube continuous. 07/13/23   Rocky Morel, DO  polyethylene glycol (MIRALAX / GLYCOLAX) 17 g packet Place 17 g into feeding tube daily. 07/14/23   Rocky Morel, DO  polyethylene glycol (MIRALAX / GLYCOLAX) 17 g packet Place 17 g into feeding tube daily as needed for moderate constipation. 07/13/23   Rocky Morel, DO  potassium chloride SA (KLOR-CON M) 20 MEQ tablet Take 1 tablet (20 mEq total) by mouth daily. Or every 4 hours as needed for potassium electrolyte replacement. 07/13/23   Rocky Morel, DO  Protein (FEEDING SUPPLEMENT, PROSOURCE TF20,) liquid Place 60 mLs into feeding tube daily. 07/14/23   Rocky Morel, DO  QUEtiapine (SEROQUEL) 100 MG tablet Place 1 tablet (100 mg total) into feeding tube at bedtime. 07/13/23   Rocky Morel, DO  QUEtiapine (SEROQUEL) 50 MG tablet Place 0.5 tablets (25 mg total) into feeding tube every morning. 07/14/23   Rocky Morel, DO  sertraline (ZOLOFT) 100 MG tablet TAKE 2 TABLETS BY MOUTH IN THE  MORNING AND 1 TABLET BY MOUTH  IN THE EVENING 08/12/23   Cottle, Steva Ready., MD  simethicone (MYLICON) 80 MG chewable tablet Place 80 mg into feeding tube 4 (four) times  daily.    [provider]  sodium chloride HYPERTONIC 3 % nebulizer solution Take 4 mLs by nebulization 2 (two) times daily. 07/13/23   Rocky Morel, DO  vancomycin (VANCOREADY) 1250 MG/250ML SOLN Inject 250 mLs (1,250 mg total) into the vein daily. 07/13/23   Rocky Morel, DO     Critical care time: 45 minutes    JD Anselm Lis [facility] 08/13/2023, 7:19 PM  Please see [email or web address] for pager details.  From 7A-7P if no response, please call [phone]. After hours, please call ELink [phone].

## 2023-08-13 NOTE — H&P (Addendum)
History and Physical    Patient: Joshua Eaton ZOX:[id number] DOB: [birth year] DOA: 08/13/2023 DOS: the patient was seen and examined on 08/13/2023 PCP: [name], MD  [facility]   Chief Complaint: GI bleed  HPI: Hance Rensel is a 67 y.o. male with medical history significant of paroxysmal atrial fibrillation, DVT, right hemicolectomy for colon cancer, respiratory failure and tracheostomy with recent admission from 10/3-10/28 at  [facility] after a 2 week admission at [facility] in September for pneumonia complicated by ARDS and acute cardiogenic pulmonary edema. He has severe MR recently diagnosed and is not a candidate for valve replacement due to overall frailty. He underwent tracheostomy and PEG placement during his admission and [facility]. Later that day he had an accidental tracheostomy dislodgement, and his trach stayed out. The following day he developed increased work of breathing and transferred to ED on 10/31 ultimately required intubation and had workup for possible bowel perforation which was ruled out. He was treated for ventilator associated pneumonia and later placed back on vent for positive MRSA sputum culture and treated with vancomycin. He was transferred to [facility] on 11/17 for vent weaning.   Today he was sent back to ED for concerns of GI bleed.  Patient is PEG dependent. Family at bedside reports episode of vomiting yesterday and he had abd X-ray showing no obstruction. Then underwent CTA A/P yesterday with findings of suspected active GI bleed along the posterior gastric antrum/pylorus.  Hemoglobin yesterday at 8.7 down from 9.2.  Dr. Adela Lank with GI was consulted and advised that he be admitted for further GI workup. He also has been having bloody sputum suctioned from his tracheostomy for the past week. Per documentation a bronchoscopy was discussed but family wanted to defer until GI bleed issue was resolved.   Repeat  hemoglobin today of 10.9/hct 35.  Creatinine elevated to 1.52 from 0.99. D-dimer was significantly elevated at 16.  CTA chest was negative for pulmonary embolism.  He had improving bilateral airspace disease.    Hospitalist consulted for admission of GI bleed.  ICU consulted for ventilator management.  Family was very frustrated with care he has received.   review of Systems: As mentioned in the history of present illness. All other systems reviewed and are negative. Past Medical History:  Diagnosis Date   Anxiety    Cancer (HCC)    Depression    Hypertension    OCD (obsessive compulsive disorder)    Past Surgical History:  Procedure Laterality Date   CHOLECYSTECTOMY     COLON SURGERY     cancerous polyp removed   HERNIA REPAIR     IR GASTROSTOMY TUBE MOD SED  06/24/2023   Social History:  reports that he has never smoked. He has never used smokeless tobacco. He reports that he does not drink alcohol and does not use drugs.  Allergies  Allergen Reactions   Codeine     "sends me into orbit"    No family history on file.  Prior to Admission medications   Medication Sig Start Date End Date Taking? Authorizing Provider  acetaminophen (TYLENOL) 325 MG tablet Take 650 mg by mouth every 6 (six) hours as needed for moderate pain.    [provider]  amiodarone (PACERONE) 200 MG tablet Place 1 tablet (200 mg total) into feeding tube daily. 07/14/23   Rocky Morel, DO  apixaban (ELIQUIS) 5 MG TABS tablet Place 1 tablet (5 mg total) into feeding  tube 2 (two) times daily. 06/28/23   Rhetta Mura, MD  Chlorhexidine Gluconate Cloth 2 % PADS Apply 6 each topically daily. 07/14/23   Rocky Morel, DO  clonazePAM (KLONOPIN) 0.5 MG tablet Place 1 tablet (0.5 mg total) into feeding tube 2 (two) times daily. 07/13/23   Rocky Morel, DO  dexmedetomidine (PRECEDEX) 400 MCG/100ML SOLN Inject 0-114.72 mcg/hr into the vein continuous. 07/13/23   Rocky Morel, DO  dextrose  (GLUTOSE) 40 % GEL Take 15 g by mouth daily as needed for low blood sugar (70mg /dL).    [provider]  docusate (COLACE) 50 MG/5ML liquid Place 10 mLs (100 mg total) into feeding tube 2 (two) times daily. 07/13/23   Rocky Morel, DO  Electrolyte-A in Dextrose (DEXTROSE 50%/ELECTROLYTES IV) Inject 25 g into the vein daily as needed (blood sugar 50mg /dL).    [provider]  famotidine (PEPCID) 20 MG tablet Place 1 tablet (20 mg total) into feeding tube 2 (two) times daily. 07/13/23   Rocky Morel, DO  fentaNYL (SUBLIMAZE) 50 MCG/ML injection Inject 0.25-0.5 mLs (12.5-25 mcg total) into the vein every hour as needed for moderate pain (pain score 4-6) (severe vent dysscynchrony). 07/13/23   Rocky Morel, DO  furosemide (LASIX) 20 MG tablet Place 2 tablets (40 mg total) into feeding tube daily. 07/13/23   Rocky Morel, DO  Glucagon HCl 1 MG SOLR Inject 1 mg as directed once as needed (hypoglycemia).    [provider]  hydrALAZINE (APRESOLINE) 10 MG tablet Place 10 mg into feeding tube every 8 (eight) hours.    [provider]  insulin aspart (NOVOLOG) 100 UNIT/ML injection Inject 0-15 Units into the skin every 4 (four) hours. 07/13/23   Rocky Morel, DO  ipratropium-albuterol (DUONEB) 0.5-2.5 (3) MG/3ML SOLN Take 3 mLs by nebulization every 4 (four) hours as needed (sob/wheezing).    [provider]  lip balm (CARMEX) ointment Apply topically as needed. 07/13/23   Rocky Morel, DO  magnesium oxide (MAG-OX) 400 (240 Mg) MG tablet Take 400 mg by mouth every 6 (six) hours as needed (magnesium electrolyte replacement).    [provider]  Mouthwashes (MOUTH RINSE) LIQD solution 15 mLs by Mouth Rinse route as needed (oral care). 07/13/23   Rocky Morel, DO  Mouthwashes (MOUTH RINSE) LIQD solution 15 mLs by Mouth Rinse route 4 (four) times daily. 07/13/23   Rocky Morel, DO  Multiple Vitamin (MULTIVITAMIN WITH MINERALS) TABS  tablet Place 1 tablet into feeding tube daily. 07/14/23   Rocky Morel, DO  nutrition supplement, JUVEN, (JUVEN) PACK Place 1 packet into feeding tube 2 (two) times daily between meals. 07/13/23   Rocky Morel, DO  Nutritional Supplements (FEEDING SUPPLEMENT, JEVITY 1.5 CAL/FIBER,) LIQD Place 1,000 mLs into feeding tube continuous. 07/13/23   Rocky Morel, DO  polyethylene glycol (MIRALAX / GLYCOLAX) 17 g packet Place 17 g into feeding tube daily. 07/14/23   Rocky Morel, DO  polyethylene glycol (MIRALAX / GLYCOLAX) 17 g packet Place 17 g into feeding tube daily as needed for moderate constipation. 07/13/23   Rocky Morel, DO  potassium chloride SA (KLOR-CON M) 20 MEQ tablet Take 1 tablet (20 mEq total) by mouth daily. Or every 4 hours as needed for potassium electrolyte replacement. 07/13/23   Rocky Morel, DO  Protein (FEEDING SUPPLEMENT, PROSOURCE TF20,) liquid Place 60 mLs into feeding tube daily. 07/14/23   Rocky Morel, DO  QUEtiapine (SEROQUEL) 100 MG tablet Place 1 tablet (100 mg total) into feeding tube at bedtime. 07/13/23  Rocky Morel, DO  QUEtiapine (SEROQUEL) 50 MG tablet Place 0.5 tablets (25 mg total) into feeding tube every morning. 07/14/23   Rocky Morel, DO  sertraline (ZOLOFT) 100 MG tablet TAKE 2 TABLETS BY MOUTH IN THE  MORNING AND 1 TABLET BY MOUTH IN THE EVENING 08/12/23   Cottle, Steva Ready., MD  simethicone (MYLICON) 80 MG chewable tablet Place 80 mg into feeding tube 4 (four) times daily.    [provider]  sodium chloride HYPERTONIC 3 % nebulizer solution Take 4 mLs by nebulization 2 (two) times daily. 07/13/23   Rocky Morel, DO  vancomycin (VANCOREADY) 1250 MG/250ML SOLN Inject 250 mLs (1,250 mg total) into the vein daily. 07/13/23   Rocky Morel, DO    Physical Exam: Vitals:   08/13/23 2130 08/13/23 2200 08/13/23 2230 08/13/23 2300  BP: (!) 109/49 (!) 109/51 (!) 108/48 (!) 106/46  Pulse: 78 77 70 69  Resp: 18 (!) 21 15 16    SpO2: 100% 100% 100% 100%   Constitutional: NAD, calm, comfortable, chronically ill appearing male lying in bed with occasional cough Eyes: lids and conjunctivae normal ENMT: Mucous membranes are moist. Trach in place with noted hemoptysis in tubing  Neck: normal, supple Respiratory: Diffuse rhonchorous sounds on trach ventilator but no wheezing.  Normal respiratory effort. No accessory muscle use. Occasional cough Cardiovascular: Regular rate and rhythm, no murmurs / rubs / gallops. No extremity edema. 2+ pedal pulses. No carotid bruits.  Abdomen: no tenderness, soft, non-distended, PEG tube in place  Musculoskeletal: no clubbing / cyanosis. No joint deformity upper and lower extremities.  Skin: Large targetoid erythematous lesion on anterior left patella Unstageable sacral decubitus ulcer down to bone with possible tunneling  Neurologic: CN 2-12 grossly intact. Pt alert and can nod to questions. Follows simple command with hand grip.   Psychiatric: Normal mood.   Data Reviewed:  See HPI  Assessment and Plan: * Acute GI bleeding -Pt with PEG dependency.  Also has history of right hemicolectomy secondary to colon cancer.  Had CT abdomen pelvis yesterday at [facility] demonstrating suspected active GI bleed along the posterior antrum/pylorus.  Hemoglobin yesterday down at 8.7 from 9.2.  Today hemoglobin appears hemoconcentrated at 10.9. Pt reportedly also received a blood transfusion earlier this week.  -Will type and cross.  Administer gentle IV fluids overnight.  Goal of transfusion of hemoglobin less than 7. -GI Dr. Adela Lank consulted and will see in consultation in the morning -Continue IV PPI every 12HR -Holding continuous tube feeds overnight pending further GI workup. He is on gentle IV fluid hydration overnight.  Acute respiratory failure with hypoxia (HCC) - Trach and ventilator dependent.  ICU team is consulted and following. -He was started on IV meropenem and IV vancomycin on  12/12 at [facility] possibly due to concerns for aspiration with his vomiting yesterday.  However he has CTA chest today demonstrating improving bilateral airspace disease.  Continued groundglass opacity which could reflect edema and bibasilar airspace opacity, left greater than right, atelectasis versus infiltrate.  His leukocytosis has improved from this morning.  Will hold on any antibiotics for now unless respiratory status changes clinically.  Rash -has annular Large targetoid erythematous lesion on anterior left patella. Family reports lyme workup has been negative. Unable to view records of testing.  -will continue to monitor  HTN (hypertension) Holding diuretic and hydralazine with active GI bleed to avoid hypotension  DVT (deep venous thrombosis) (HCC) - History of recent RUE DVT provoked by PICC on 06/16/2023 -Eliquis currently  on hold due to active GI bleed  Paroxysmal atrial fibrillation (HCC) - Eliquis appears to have been held during last hospitalization due to severe anemia and hemoptysis.  Family unclear if this was restarted.  Based on limited med reconciliation paperwork it appears that he has not been on Eliquis.  Will continue to hold due to active GI bleed. -Continue amiodarone  Sacral ulcer (HCC) - Patient with unstageable sacral decubitus ulcer - Recently underwent debridement on 12/11 - Wound care consult  PEG (percutaneous endoscopic gastrostomy) status (HCC) - Dietitian consult although continuous tube feed on hold due to active GI bleed  Tracheostomy in place [facility]) - Patient has been noted to have bloody aspirations from his tracheostomy for the past week.  Bronchoscopy was discussed at [facility] but family was documented to have deferred until further investigation of GI bleed which is reasonable.  AKI (acute kidney injury) (HCC) - Creatinine is elevated at 1.52 from prior of 0.9 - Keep on gentle IV fluids  Severe mitral regurgitation - Patient deemed not a surgical  candidate due to overall frailty.  This was diagnosed in September after he presented to [facility] for pneumonia complicated by RDS and acute cardiogenic pulmonary edema.      Advance Care Planning:   Code Status: Full Code   Consults: PCCM  Family Communication: Discussed extensively with wife, sister and son at bedside. All questions and concerns were addressed.   Severity of Illness: The appropriate patient status for this patient is OBSERVATION. Observation status is judged to be reasonable and necessary in order to provide the required intensity of service to ensure the patient's safety. The patient's presenting symptoms, physical exam findings, and initial radiographic and laboratory data in the context of their medical condition is felt to place them at decreased risk for further clinical deterioration. Furthermore, it is anticipated that the patient will be medically stable for discharge from the hospital within 2 midnights of admission.   Author: Anselm Jungling, DO 08/13/2023 11:15 PM  For on call review [email or web address].

## 2023-08-13 NOTE — Assessment & Plan Note (Addendum)
-   Trach and ventilator dependent.  ICU team is consulted and following. -He was started on IV meropenem and IV vancomycin on 12/12 at [facility] possibly due to concerns for aspiration with his vomiting yesterday.  However he has CTA chest today demonstrating improving bilateral airspace disease.  Continued groundglass opacity which could reflect edema and bibasilar airspace opacity, left greater than right, atelectasis versus infiltrate.  His leukocytosis has improved from this morning.  Will hold on any antibiotics for now unless respiratory status changes clinically.

## 2023-08-13 NOTE — Assessment & Plan Note (Signed)
-   Creatinine is elevated at 1.52 from prior of 0.9 - Keep on gentle IV fluids

## 2023-08-14 ENCOUNTER — Inpatient Hospital Stay (HOSPITAL_COMMUNITY): Payer: Medicare Other

## 2023-08-14 ENCOUNTER — Encounter (HOSPITAL_COMMUNITY): Payer: Self-pay | Admitting: Family Medicine

## 2023-08-14 ENCOUNTER — Observation Stay (HOSPITAL_COMMUNITY): Payer: Medicare Other

## 2023-08-14 DIAGNOSIS — Z515 Encounter for palliative care: Secondary | ICD-10-CM | POA: Diagnosis not present

## 2023-08-14 DIAGNOSIS — G9341 Metabolic encephalopathy: Secondary | ICD-10-CM | POA: Diagnosis not present

## 2023-08-14 DIAGNOSIS — I82721 Chronic embolism and thrombosis of deep veins of right upper extremity: Secondary | ICD-10-CM | POA: Diagnosis not present

## 2023-08-14 DIAGNOSIS — G928 Other toxic encephalopathy: Secondary | ICD-10-CM | POA: Diagnosis not present

## 2023-08-14 DIAGNOSIS — D62 Acute posthemorrhagic anemia: Secondary | ICD-10-CM | POA: Diagnosis present

## 2023-08-14 DIAGNOSIS — N179 Acute kidney failure, unspecified: Secondary | ICD-10-CM | POA: Diagnosis not present

## 2023-08-14 DIAGNOSIS — Z7189 Other specified counseling: Secondary | ICD-10-CM | POA: Diagnosis not present

## 2023-08-14 DIAGNOSIS — J80 Acute respiratory distress syndrome: Secondary | ICD-10-CM | POA: Diagnosis present

## 2023-08-14 DIAGNOSIS — G7281 Critical illness myopathy: Secondary | ICD-10-CM | POA: Diagnosis not present

## 2023-08-14 DIAGNOSIS — I351 Nonrheumatic aortic (valve) insufficiency: Secondary | ICD-10-CM | POA: Diagnosis not present

## 2023-08-14 DIAGNOSIS — Z930 Tracheostomy status: Secondary | ICD-10-CM | POA: Diagnosis not present

## 2023-08-14 DIAGNOSIS — Z931 Gastrostomy status: Secondary | ICD-10-CM | POA: Diagnosis not present

## 2023-08-14 DIAGNOSIS — I340 Nonrheumatic mitral (valve) insufficiency: Secondary | ICD-10-CM | POA: Diagnosis not present

## 2023-08-14 DIAGNOSIS — K922 Gastrointestinal hemorrhage, unspecified: Secondary | ICD-10-CM | POA: Diagnosis present

## 2023-08-14 DIAGNOSIS — J9611 Chronic respiratory failure with hypoxia: Secondary | ICD-10-CM | POA: Diagnosis not present

## 2023-08-14 DIAGNOSIS — Z9911 Dependence on respirator [ventilator] status: Secondary | ICD-10-CM | POA: Diagnosis not present

## 2023-08-14 DIAGNOSIS — I480 Paroxysmal atrial fibrillation: Secondary | ICD-10-CM | POA: Diagnosis not present

## 2023-08-14 DIAGNOSIS — Y848 Other medical procedures as the cause of abnormal reaction of the patient, or of later complication, without mention of misadventure at the time of the procedure: Secondary | ICD-10-CM | POA: Diagnosis not present

## 2023-08-14 DIAGNOSIS — K2981 Duodenitis with bleeding: Secondary | ICD-10-CM | POA: Diagnosis present

## 2023-08-14 DIAGNOSIS — J810 Acute pulmonary edema: Secondary | ICD-10-CM | POA: Diagnosis not present

## 2023-08-14 DIAGNOSIS — R059 Cough, unspecified: Secondary | ICD-10-CM | POA: Diagnosis not present

## 2023-08-14 DIAGNOSIS — D6959 Other secondary thrombocytopenia: Secondary | ICD-10-CM | POA: Diagnosis present

## 2023-08-14 DIAGNOSIS — J9601 Acute respiratory failure with hypoxia: Secondary | ICD-10-CM | POA: Diagnosis not present

## 2023-08-14 DIAGNOSIS — R609 Edema, unspecified: Secondary | ICD-10-CM | POA: Diagnosis not present

## 2023-08-14 DIAGNOSIS — D696 Thrombocytopenia, unspecified: Secondary | ICD-10-CM | POA: Diagnosis not present

## 2023-08-14 DIAGNOSIS — D638 Anemia in other chronic diseases classified elsewhere: Secondary | ICD-10-CM | POA: Diagnosis present

## 2023-08-14 DIAGNOSIS — R042 Hemoptysis: Secondary | ICD-10-CM | POA: Diagnosis present

## 2023-08-14 DIAGNOSIS — A419 Sepsis, unspecified organism: Secondary | ICD-10-CM | POA: Diagnosis not present

## 2023-08-14 DIAGNOSIS — K3189 Other diseases of stomach and duodenum: Secondary | ICD-10-CM | POA: Diagnosis not present

## 2023-08-14 DIAGNOSIS — K2289 Other specified disease of esophagus: Secondary | ICD-10-CM | POA: Diagnosis not present

## 2023-08-14 DIAGNOSIS — I501 Left ventricular failure: Secondary | ICD-10-CM | POA: Diagnosis present

## 2023-08-14 DIAGNOSIS — J1569 Pneumonia due to other gram-negative bacteria: Secondary | ICD-10-CM | POA: Diagnosis not present

## 2023-08-14 DIAGNOSIS — F05 Delirium due to known physiological condition: Secondary | ICD-10-CM | POA: Diagnosis not present

## 2023-08-14 DIAGNOSIS — R933 Abnormal findings on diagnostic imaging of other parts of digestive tract: Secondary | ICD-10-CM

## 2023-08-14 DIAGNOSIS — J9382 Other air leak: Secondary | ICD-10-CM | POA: Diagnosis not present

## 2023-08-14 DIAGNOSIS — F32A Depression, unspecified: Secondary | ICD-10-CM | POA: Diagnosis present

## 2023-08-14 DIAGNOSIS — L98429 Non-pressure chronic ulcer of back with unspecified severity: Secondary | ICD-10-CM | POA: Diagnosis not present

## 2023-08-14 DIAGNOSIS — I080 Rheumatic disorders of both mitral and aortic valves: Secondary | ICD-10-CM | POA: Diagnosis present

## 2023-08-14 DIAGNOSIS — E440 Moderate protein-calorie malnutrition: Secondary | ICD-10-CM | POA: Diagnosis present

## 2023-08-14 DIAGNOSIS — Y828 Other medical devices associated with adverse incidents: Secondary | ICD-10-CM | POA: Diagnosis not present

## 2023-08-14 DIAGNOSIS — R9431 Abnormal electrocardiogram [ECG] [EKG]: Secondary | ICD-10-CM | POA: Diagnosis not present

## 2023-08-14 DIAGNOSIS — I110 Hypertensive heart disease with heart failure: Secondary | ICD-10-CM | POA: Diagnosis present

## 2023-08-14 DIAGNOSIS — K5710 Diverticulosis of small intestine without perforation or abscess without bleeding: Secondary | ICD-10-CM | POA: Diagnosis not present

## 2023-08-14 DIAGNOSIS — Y95 Nosocomial condition: Secondary | ICD-10-CM | POA: Diagnosis not present

## 2023-08-14 DIAGNOSIS — R578 Other shock: Secondary | ICD-10-CM | POA: Diagnosis not present

## 2023-08-14 DIAGNOSIS — M7989 Other specified soft tissue disorders: Secondary | ICD-10-CM | POA: Diagnosis not present

## 2023-08-14 DIAGNOSIS — J440 Chronic obstructive pulmonary disease with acute lower respiratory infection: Secondary | ICD-10-CM | POA: Diagnosis not present

## 2023-08-14 DIAGNOSIS — Z66 Do not resuscitate: Secondary | ICD-10-CM | POA: Diagnosis not present

## 2023-08-14 DIAGNOSIS — I468 Cardiac arrest due to other underlying condition: Secondary | ICD-10-CM | POA: Diagnosis not present

## 2023-08-14 LAB — GLUCOSE, CAPILLARY
Glucose-Capillary: 94 mg/dL (ref 70–99)
Glucose-Capillary: 106 mg/dL — ABNORMAL HIGH (ref 70–99)
Glucose-Capillary: 102 mg/dL — ABNORMAL HIGH (ref 70–99)
Glucose-Capillary: 91 mg/dL (ref 70–99)

## 2023-08-14 LAB — CBC
HCT: 27.8 % — ABNORMAL LOW (ref 39.0–52.0)
Hemoglobin: 8.7 g/dL — ABNORMAL LOW (ref 13.0–17.0)
MCH: 30.4 pg (ref 26.0–34.0)
MCHC: 31.3 g/dL (ref 30.0–36.0)
MCV: 97.2 fL (ref 80.0–100.0)
Platelets: 196 10*3/uL (ref 150–400)
RBC: 2.86 MIL/uL — ABNORMAL LOW (ref 4.22–5.81)
RDW: 16.7 % — ABNORMAL HIGH (ref 11.5–15.5)
WBC: 13 10*3/uL — ABNORMAL HIGH (ref 4.0–10.5)
nRBC: 0 % (ref 0.0–0.2)

## 2023-08-14 LAB — BASIC METABOLIC PANEL
Anion gap: 5 (ref 5–15)
BUN: 60 mg/dL — ABNORMAL HIGH (ref 8–23)
CO2: 33 mmol/L — ABNORMAL HIGH (ref 22–32)
Calcium: 7.9 mg/dL — ABNORMAL LOW (ref 8.9–10.3)
Chloride: 101 mmol/L (ref 98–111)
Creatinine, Ser: 1.5 mg/dL — ABNORMAL HIGH (ref 0.61–1.24)
GFR, Estimated: 50 mL/min — ABNORMAL LOW (ref >60)
Glucose, Bld: 101 mg/dL — ABNORMAL HIGH (ref 70–99)
Potassium: 3 mmol/L — ABNORMAL LOW (ref 3.5–5.1)
Sodium: 139 mmol/L (ref 135–145)

## 2023-08-14 LAB — MRSA NEXT GEN BY PCR, NASAL: MRSA by PCR Next Gen: DETECTED — AB

## 2023-08-14 LAB — HEMOGLOBIN: Hemoglobin: 7.7 g/dL — ABNORMAL LOW (ref 13.0–17.0)

## 2023-08-14 MED ORDER — LORAZEPAM 2 MG/ML IJ SOLN
1.0000 mg | Freq: Once | INTRAMUSCULAR | Status: DC
  Filled 2023-08-14: qty 1

## 2023-08-14 MED ORDER — MUPIROCIN 2 % EX OINT
1.0000 | TOPICAL_OINTMENT | Freq: Two times a day (BID) | CUTANEOUS | Status: AC
  Administered 2023-08-14 – 2023-08-18 (×10): 1 via NASAL
  Filled 2023-08-14 (×2): qty 22

## 2023-08-14 MED ORDER — SODIUM CHLORIDE 0.9% IV SOLUTION: Freq: Once | INTRAVENOUS | Status: DC

## 2023-08-14 MED ORDER — OXYCODONE HCL 5 MG PO TABS
5.0000 mg | ORAL_TABLET | ORAL | Status: DC | PRN
  Administered 2023-08-14 – 2023-09-13 (×31): 5 mg
  Filled 2023-08-14 (×32): qty 1

## 2023-08-14 MED ORDER — DEXTROSE IN LACTATED RINGERS 5 % IV SOLN: INTRAVENOUS | Status: AC

## 2023-08-14 MED ORDER — CHLORHEXIDINE GLUCONATE CLOTH 2 % EX PADS
6.0000 | MEDICATED_PAD | Freq: Every day | CUTANEOUS | Status: DC
  Administered 2023-08-14 – 2023-08-18 (×5): 6 via TOPICAL

## 2023-08-14 MED ORDER — ORAL CARE MOUTH RINSE
15.0000 mL | OROMUCOSAL | Status: DC
  Administered 2023-08-14 – 2023-09-15 (×370): 15 mL via OROMUCOSAL

## 2023-08-14 MED ORDER — HYDROMORPHONE HCL 1 MG/ML IJ SOLN
0.5000 mg | Freq: Once | INTRAMUSCULAR | Status: AC
  Administered 2023-08-14: 0.5 mg via INTRAVENOUS
  Filled 2023-08-14: qty 0.5

## 2023-08-14 MED ORDER — LACTATED RINGERS IV SOLN: INTRAVENOUS | Status: DC

## 2023-08-14 MED ORDER — ETOMIDATE 2 MG/ML IV SOLN
INTRAVENOUS | Status: AC
  Administered 2023-08-14: 20 mg via INTRAVENOUS
  Filled 2023-08-14: qty 10

## 2023-08-14 MED ORDER — ETOMIDATE 2 MG/ML IV SOLN
20.0000 mg | Freq: Once | INTRAVENOUS | Status: AC
  Administered 2023-08-14: 20 mg via INTRAVENOUS

## 2023-08-14 MED ORDER — METHOCARBAMOL 1000 MG/10ML IJ SOLN: 500.0000 mg | Freq: Once | INTRAMUSCULAR | Status: DC | PRN

## 2023-08-14 MED ORDER — ORAL CARE MOUTH RINSE: 15.0000 mL | OROMUCOSAL | Status: DC | PRN

## 2023-08-14 MED ORDER — SODIUM CHLORIDE 0.9 % IV SOLN: 12.5000 mg | Freq: Four times a day (QID) | INTRAVENOUS | Status: DC | PRN

## 2023-08-14 MED ORDER — POTASSIUM CHLORIDE 10 MEQ/100ML IV SOLN
10.0000 meq | INTRAVENOUS | Status: AC
Start: 2023-08-14 — End: 2023-08-14
  Administered 2023-08-14 (×6): 10 meq via INTRAVENOUS
  Filled 2023-08-14 (×6): qty 100

## 2023-08-14 MED ORDER — ONDANSETRON HCL 4 MG/2ML IJ SOLN: 4.0000 mg | Freq: Four times a day (QID) | INTRAMUSCULAR | Status: DC | PRN

## 2023-08-14 MED ORDER — CLOTRIMAZOLE 1 % EX CREA
TOPICAL_CREAM | Freq: Two times a day (BID) | CUTANEOUS | Status: DC
  Filled 2023-08-14: qty 15

## 2023-08-14 MED ORDER — MAGNESIUM SULFATE 2 GM/50ML IV SOLN
2.0000 g | Freq: Once | INTRAVENOUS | Status: AC
  Administered 2023-08-14: 2 g via INTRAVENOUS
  Filled 2023-08-14: qty 50

## 2023-08-14 NOTE — Procedures (Signed)
Central Venous Catheter Insertion Procedure Note  [place]ne  [id number]  March 11, 1956  Date:08/14/23  Time:4:47 PM   Provider Performing:Jerzy Roepke Erby Pian   Procedure: Insertion of Non-tunneled Central Venous [phone]) with US guidance ([id number])   Indication(s) Difficult access  Consent Risks of the procedure as well as the alternatives and risks of each were explained to the patient and/or caregiver.  Consent for the procedure was obtained and is signed in the bedside chart  Anesthesia Topical only with 1% lidocaine   Timeout Verified patient identification, verified procedure, site/side was marked, verified correct patient position, special equipment/implants available, medications/allergies/relevant history reviewed, required imaging and test results available.  Sterile Technique Maximal sterile technique including full sterile barrier drape, hand hygiene, sterile gown, sterile gloves, mask, hair covering, sterile ultrasound probe cover (if used).  Procedure Description Area of catheter insertion was cleaned with chlorhexidine and draped in sterile fashion.  With real-time ultrasound guidance a central venous catheter was placed into the right internal jugular vein. Nonpulsatile blood flow and easy flushing noted in all ports.  The catheter was sutured in place and sterile dressing applied.  Complications/Tolerance None; patient tolerated the procedure well. Chest X-ray is ordered to verify placement for internal jugular or subclavian cannulation.   Chest x-ray is not ordered for femoral cannulation.  EBL Minimal  Specimen(s) None

## 2023-08-14 NOTE — Consult Note (Signed)
Consultation  Referring Provider: Dr. Rennis Chris     Primary Care Physician:  [name], MD Primary Gastroenterologist:    [facility]     Reason for Consultation:    GI bleed?         HPI:   Joshua Eaton is a 67 y.o. male with a past medical history of A-fib, history of DVT, right hemicolectomy for colon cancer, respiratory failure with chronic tracheostomy, who was recently admitted and discharged to [facility].    Admission 10/3-10/28 at [facility] for 2-week admission at [facility] in September for pneumonia, complicated by acute cardiogenic pulmonary edema, severe mitral regurg recently diagnosed but not a candidate for valve replacement due to overall frailty, underwent tracheostomy and PEG placement during [facility].  Later that day had accidental tracheostomy dislodgment his trach stayed out, the following day he developed increased work of breathing and transferred to the ED on 10/31.  Ultimately required reintubation and workup for possible bowel perforation which was ruled out.  Treated for ventilator associated pneumonia and placed back on the ventilator with positive MRSA sputum culture and treated with Vancomycin.  Transferred to LTAC on 11/17 for vent weaning.      Most recently patient transferred back to the ED on 08/13/2023 for concerns of GI bleed.  Family at bedside reported episode of vomiting and abdominal x-ray showed no obstruction.  He underwent CTA of the abdomen pelvis with findings suggestive of an active GI bleed along the posterior gastric antrum/pylorus.  Of note patient has been having bloody sputum from the tracheostomy.    Today, patient unable to answer questions due to trach, but is able to nod his head yes or no.  His wife is in the corner of the room also helps with history.  They are somewhat concerned given patient's lengthy stay here at the hospital and the care he has been receiving  at [facility] specialty.  Per them patient had an episode of vomiting that was "quite violent" on Thursday, 08/12/2023.  This prompted imaging with suspected GI bleed as below.  Since arriving here on the unit nursing staff reports 2 small melenic looking stools.  His output from G-tube is a dark green in color, has not been hemocculted.  He is having some bloody sputum from his trach.  Otherwise things seem fairly stable.    Family denies any signs of abdominal pain.      ER course: Potassium 3.0, BUN 60 (baseline around 30 since 08/06/2023), creatinine 1.5, hemoglobin 8.7 (10.1 on 08/13/2023), MCV normal, platelets normal.  White count 13 (15.8 on 08/13/2023); 08/12/2023 CT angio for bleed was motion degraded, suspected active GI bleeding on the posterior gastric antrum/pylorus, percutaneous gastrostomy in satisfactory position, secondary inflammatory changes in the third and fourth portion of the duodenum suggesting duodenitis and a small left pleural effusion  GI history: 02/23/2017 colonoscopy: Done at [facility], cannot see report  Past Medical History:  Diagnosis Date   Anxiety    Cancer (HCC)    Depression    Hypertension    OCD (obsessive compulsive disorder)     Past Surgical History:  Procedure Laterality Date   CHOLECYSTECTOMY     COLON SURGERY     cancerous polyp removed   HERNIA REPAIR     IR GASTROSTOMY TUBE MOD SED  06/24/2023    History reviewed. No pertinent family history.   Social History  Tobacco Use   Smoking status: Never    Passive exposure: Never   Smokeless tobacco: Never  Vaping Use   Vaping status: Never Used  Substance Use Topics   Alcohol use: No   Drug use: No    Prior to Admission medications   Medication Sig Start Date End Date Taking? Authorizing Provider  acetaminophen (TYLENOL) 325 MG tablet Place 650 mg into feeding tube every 6 (six) hours as needed for moderate pain (pain score 4-6).   Yes [provider]  amiodarone (PACERONE)  200 MG tablet Place 1 tablet (200 mg total) into feeding tube daily. 07/14/23  Yes Rocky Morel, DO  insulin lispro (HUMALOG) 100 UNIT/ML injection Inject 0-12 Units into the skin every 6 (six) hours. <70          Initiate hypoglycemia treatmen 70-150     no insulin 151-200   2 units 201-250   4 units 251-300   6 units 301-350   8 units 351-400   10 units >400        12 units and call medical provider   Yes [provider]  ipratropium-albuterol (DUONEB) 0.5-2.5 (3) MG/3ML SOLN Take 3 mLs by nebulization every 4 (four) hours as needed (sob/wheezing).   Yes [provider]  lidocaine 2% Take 2 mLs by nebulization every 4 (four) hours as needed (For cough). 08/09/23  Yes [provider]  LORazepam (ATIVAN) 2 MG/ML injection Inject 2 mg into the vein every 8 (eight) hours as needed for anxiety. 08/12/23 08/26/23 Yes [provider]  meropenem (MERREM) 1 g injection Inject 1 g into the vein every 8 (eight) hours. Administer over 3 minutes by small volume IV push 08/12/23 08/19/23 Yes [provider]  Mouthwashes (BIOTENE DRY MOUTH MT) Use as directed 1 spray in the mouth or throat as needed (Dry mouth).   Yes [provider]  ondansetron (ZOFRAN) 4 MG/2ML SOLN injection Inject 4 mg into the vein every 6 (six) hours as needed for nausea or vomiting. 08/12/23  Yes [provider]  oxyCODONE (OXY IR/ROXICODONE) 5 MG immediate release tablet Place 5 mg into feeding tube every 4 (four) hours as needed for severe pain (pain score 7-10). 08/09/23 08/23/23 Yes [provider]  pantoprazole (PROTONIX) 40 MG injection Inject 40 mg into the vein 2 (two) times daily. 08/13/23  Yes [provider]  potassium chloride SA (KLOR-CON M) 20 MEQ tablet Take 1 tablet (20 mEq total) by mouth daily. Or every 4 hours as needed for potassium electrolyte replacement. Patient taking differently: Take 40 mEq by mouth 2 (two) times daily. 07/13/23   Yes Rocky Morel, DO  QUEtiapine (SEROQUEL) 50 MG tablet Place 0.5 tablets (25 mg total) into feeding tube every morning. Patient taking differently: Place 50 mg into feeding tube 3 (three) times daily. 07/14/23  Yes Rocky Morel, DO  sertraline (ZOLOFT) 100 MG tablet TAKE 2 TABLETS BY MOUTH IN THE  MORNING AND 1 TABLET BY MOUTH IN THE EVENING 08/12/23  Yes Cottle, Steva Ready., MD  silver sulfADIAZINE (SILVADENE) 1 % cream Apply 1 Application topically as needed (For wound care).   Yes [provider]  vancomycin 1,000 mg in sodium chloride 0.9 % 250 mL Inject 1,000 mg into the vein every 12 (twelve) hours. 08/12/23 08/19/23 Yes [provider]  zolpidem (AMBIEN) 5 MG tablet Place 5 mg into feeding tube at bedtime.   Yes [provider]  apixaban (ELIQUIS) 5 MG TABS tablet Place 1  tablet (5 mg total) into feeding tube 2 (two) times daily. Patient not taking: Reported on 08/14/2023 06/28/23   Rhetta Mura, MD  Chlorhexidine Gluconate Cloth 2 % PADS Apply 6 each topically daily. 07/14/23   Rocky Morel, DO  Electrolyte-A in Dextrose (DEXTROSE 50%/ELECTROLYTES IV) Inject 25 g into the vein daily as needed (blood sugar 50mg /dL).    [provider]  Glucagon HCl 1 MG SOLR Inject 1 mg as directed once as needed (hypoglycemia).    [provider]  insulin aspart (NOVOLOG) 100 UNIT/ML injection Inject 0-15 Units into the skin every 4 (four) hours. Patient not taking: Reported on 08/14/2023 07/13/23   Rocky Morel, DO  lip balm (CARMEX) ointment Apply topically as needed. 07/13/23   Rocky Morel, DO  magnesium oxide (MAG-OX) 400 (240 Mg) MG tablet Place 400 mg into feeding tube every 6 (six) hours as needed (magnesium electrolyte replacement).    [provider]  nutrition supplement, JUVEN, (JUVEN) PACK Place 1 packet into feeding tube 2 (two) times daily between meals. 07/13/23   Rocky Morel, DO  Nutritional Supplements  (FEEDING SUPPLEMENT, JEVITY 1.5 CAL/FIBER,) LIQD Place 1,000 mLs into feeding tube continuous. 07/13/23   Rocky Morel, DO  Protein (FEEDING SUPPLEMENT, PROSOURCE TF20,) liquid Place 60 mLs into feeding tube daily. 07/14/23   Rocky Morel, DO    Current Facility-Administered Medications  Medication Dose Route Frequency Provider Last Rate Last Admin   acetaminophen (TYLENOL) tablet 650 mg  650 mg Oral Q6H PRN Tu, Ching T, DO       amiodarone (PACERONE) tablet 200 mg  200 mg Per Tube Daily Tu, Ching T, DO       Chlorhexidine Gluconate Cloth 2 % PADS 6 each  6 each Topical Daily Dezii, Alexandra, DO   6 each at 08/14/23 1014   ipratropium-albuterol (DUONEB) 0.5-2.5 (3) MG/3ML nebulizer solution 3 mL  3 mL Nebulization Q4H PRN Lidia Collum, PA-C       lactated ringers infusion   Intravenous Continuous Tu, Ching T, DO   Stopped at 08/14/23 0320   LORazepam (ATIVAN) injection 1 mg  1 mg Intravenous Q8H PRN Tu, Ching T, DO       Oral care mouth rinse  15 mL Mouth Rinse Q2H Dezii, Alexandra, DO   15 mL at 08/14/23 1014   Oral care mouth rinse  15 mL Mouth Rinse PRN Dezii, Alexandra, DO       oxyCODONE (Oxy IR/ROXICODONE) immediate release tablet 5 mg  5 mg Oral Q4H PRN Tu, Ching T, DO       pantoprazole (PROTONIX) injection 40 mg  40 mg Intravenous Q12H Prosperi, Christian H, PA-C   40 mg at 08/14/23 0028   sertraline (ZOLOFT) tablet 100 mg  100 mg Oral Daily Tu, Ching T, DO       zolpidem (AMBIEN) tablet 5 mg  5 mg Oral QHS PRN Tu, Ching T, DO        Allergies as of 08/13/2023 - Reviewed 08/13/2023  Allergen Reaction Noted   Codeine  10/06/2015     Review of Systems:    Unable to answer questions    Physical Exam:  Vital signs in last 24 hours: Temp:  [98.4 F (36.9 C)-98.7 F (37.1 C)] 98.4 F (36.9 C) (12/14 1013) Pulse Rate:  [62-84] 72 (12/14 1014) Resp:  [15-23] 20 (12/14 1014) BP: (104-131)/(43-68) 123/51 (12/14 1014) SpO2:  [98 %-100 %] 100 % (12/14 1014) FiO2 (%):  [40  %] 40 % (  12/14 1104) Weight:  [86.6 kg] 86.6 kg (12/14 0453) Last BM Date : 08/14/23 General:   Pleasant chronically ill-appearing Caucasian male appears to be in NAD, Well developed, Well nourished, alert and cooperative Head:  Normocephalic and atraumatic. Eyes:   PEERL, EOMI. No icterus. Conjunctiva pink. Ears:  Normal auditory acuity. Neck:  Supple Throat: Oral cavity and pharynx without inflammation, swelling or lesion. Teeth in good condition. + Tracheostomy with some bloody sputum Lungs: Respirations even and unlabored. Lungs clear to auscultation bilaterally.   No wheezes, crackles, or rhonchi.  Heart: Normal S1, S2. No MRG. Regular rate and rhythm. No peripheral edema, cyanosis or pallor.  Abdomen:  Soft, nondistended, nontender. No rebound or guarding. Normal bowel sounds. No appreciable masses or hepatomegaly. +G-tube-output is a dark grainy material, 50 cc currently but apparently just emptied 500 cc per nursing staff Rectal:  Not performed.  Msk:  Symmetrical without gross deformities. Peripheral pulses intact.  Extremities:  Without edema, no deformity or joint abnormality. Normal ROM, normal sensation. Neurologic:  Alert   grossly normal neurologically.  Skin:   Dry and intact without significant lesions or rashes. Psychiatric: Demonstrates good judgement and reason  LAB RESULTS: Recent Labs    08/13/23 0711 08/13/23 1732 08/13/23 2157 08/14/23 0456  WBC 17.6* 15.8*  --  13.0*  HGB 10.9* 10.1* 8.8* 8.7*  HCT 35.0* 32.6* 26.0* 27.8*  PLT 216 212  --  196   BMET Recent Labs    08/12/23 0316 08/13/23 0936 08/13/23 2157 08/14/23 0456  NA 142 141 142 139  K 4.3 3.8 3.2* 3.0*  CL 99 98  --  101  CO2 35* 33*  --  33*  GLUCOSE 128* 128*  --  101*  BUN 40* 55*  --  60*  CREATININE 0.99 1.52*  --  1.50*  CALCIUM 8.8* 8.5*  --  7.9*   LFT Recent Labs    08/13/23 0936  PROT 5.0*  ALBUMIN 1.6*  AST 20  ALT 15  ALKPHOS 42  BILITOT 1.1   PT/INR Recent Labs     08/13/23 0936  LABPROT 16.7*  INR 1.3*    STUDIES: CT Angio Chest Pulmonary Embolism (PE) W or WO Contrast Result Date: 08/13/2023 CLINICAL DATA:  Shortness of breath, hemoptysis. EXAM: CT ANGIOGRAPHY CHEST WITH CONTRAST TECHNIQUE: Multidetector CT imaging of the chest was performed using the standard protocol during bolus administration of intravenous contrast. Multiplanar CT image reconstructions and MIPs were obtained to evaluate the vascular anatomy. RADIATION DOSE REDUCTION: This exam was performed according to the departmental dose-optimization program which includes automated exposure control, adjustment of the mA and/or kV according to patient size and/or use of iterative reconstruction technique. CONTRAST:  75mL OMNIPAQUE IOHEXOL 350 MG/ML SOLN COMPARISON:  08/04/2023 FINDINGS: Cardiovascular: No filling defects in the pulmonary arteries to suggest pulmonary emboli. Cardiomegaly. Aorta normal caliber. Mediastinum/Nodes: No mediastinal, hilar, or axillary adenopathy. Tracheostomy tube in the midtrachea, unchanged. Thyroid and esophagus unremarkable. Lungs/Pleura: Improving bilateral airspace disease. Small left effusion. Bronchiectasis in the lower lobes with dependent airspace opacities, left greater than right. Scattered ground-glass opacities throughout the lungs. Upper Abdomen: Perihepatic and perisplenic ascites. Musculoskeletal: Chest wall soft tissues are unremarkable. No acute bony abnormality. Review of the MIP images confirms the above findings. IMPRESSION: No evidence of pulmonary embolus. Cardiomegaly. Improving bilateral airspace disease. Continued ground-glass opacities which could reflect edema and bibasilar airspace opacities, left greater than right, atelectasis versus infiltrates. Small left effusion. Small amount of upper abdominal ascites. Aortic Atherosclerosis (  ICD10-I70.0). Electronically Signed   By: Charlett Nose M.D.   On: 08/13/2023 02:35   CT ANGIO GI BLEED Result  Date: 08/12/2023 CLINICAL DATA:  GI bleed, shortness of breath EXAM: CTA ABDOMEN AND PELVIS WITHOUT AND WITH CONTRAST TECHNIQUE: Multidetector CT imaging of the abdomen and pelvis was performed using the standard protocol during bolus administration of intravenous contrast. Multiplanar reconstructed images and MIPs were obtained and reviewed to evaluate the vascular anatomy. RADIATION DOSE REDUCTION: This exam was performed according to the departmental dose-optimization program which includes automated exposure control, adjustment of the mA and/or kV according to patient size and/or use of iterative reconstruction technique. CONTRAST:  OMNIPAQUE IOHEXOL 350 MG/ML SOLN COMPARISON:  Partial comparison to CT chest dated 08/14/2023. CT abdomen/pelvis dated 07/02/2023. FINDINGS: Motion degraded images. Lower chest: Small left pleural effusion with associated left lower lobe opacity, atelectasis versus pneumonia. Additional ground-glass opacities in the visualized lungs, mildly improved, favoring mild interstitial edema. Hepatobiliary: Liver is within normal limits. Status post cholecystectomy. No intrahepatic or extrahepatic dilatation. Pancreas: Within normal limits. Spleen: Within normal limits. Adrenals/Urinary Tract: Adrenal glands are within normal limits. Kidneys are within normal limits.  No hydronephrosis. Bladder is within normal limits. Stomach/Bowel: Percutaneous gastrostomy in satisfactory position. Following contrast administration, there is intraluminal spillage of contrast along the posterior gastric antrum and pylorus (series 7/images 66-70), suggesting the site of active GI bleeding. Secondary inflammatory changes involving the 3rd/4th portion of the duodenum (series 7/image 13), suggesting duodenitis. No evidence of bowel obstruction. Status post right hemicolectomy with appendectomy. Mild sigmoid diverticulosis, without evidence of diverticulitis. Vascular/Lymphatic: No evidence of abdominal  aortic aneurysm. Atherosclerotic calcifications of the abdominal aorta and branch vessels, although vessels remain patent. No suspicious abdominopelvic lymphadenopathy. Reproductive: Prostate is unremarkable. Other: Trace right pelvic ascites. Musculoskeletal: Mild degenerative changes of the lumbar spine. IMPRESSION: Motion degraded images. Suspected active GI bleeding along the posterior gastric antrum/pylorus, as above. Percutaneous gastrostomy in satisfactory position. Secondary inflammatory changes involving the 3rd/4th portion of the duodenum, suggesting duodenitis. Small left pleural effusion with associated left lower lobe opacity, atelectasis versus pneumonia. Additional ground-glass opacities in the visualized lungs, mildly improved, favoring mild interstitial edema. Electronically Signed   By: Charline Bills M.D.   On: 08/12/2023 18:59    Impression / Plan:   Impression: 1.  Acute GI bleeding: Patient with CTA on 08/12/2023 with signs of bleeding from the posterior stomach, does have G-tube in place and anterior stomach, did pass 2 melenic stools today, otherwise no prior signs of GI bleeding, G-tube producing dark green material, does have some bloody sputum from tracheostomy; upper GI bleed 2.  Tracheostomy in place and ventilator dependent 3.  History of ONG:EXBMWUX on hold 4.  Hypokalemia 5.  Unstageable sacral decubitus ulcer 6.  Severe mitral regurgitation 7.  History of right hemicolectomy secondary to colon cancer  Plan: 1.  Patient will need an EGD.  We will plan on this tomorrow after patient's potassium has been corrected and he is resuscitated some overnight.  Discussed this with the patient's wife by bedside.  She agrees to procedure. 2.  Patient to remain n.p.o. after midnight 3.  Continue monitor hemoglobin and transfuse as needed less than 7 4.  Appreciate hospitalist team's help with hypokalemia  Thank you for your kind consultation, we will continue to  follow.  Violet Baldy Tabitha Tupper  08/14/2023, 11:08 AM

## 2023-08-14 NOTE — ED Notes (Signed)
Attempted to call report to [facility], SBAR placed.

## 2023-08-14 NOTE — Progress Notes (Signed)
Initial Nutrition Assessment  DOCUMENTATION CODES:   Not applicable  INTERVENTION:   Discussed nutrition plan with Dr. Rennis Chris; plan to hold TF initiation until after EGD given GI bleed and then further assess when to initiate  Tube Feeding Recommendations via  PEG:  Osmolite 1.5 at 60 ml/hr with Pro-Source TF20 60 mL BID When initiated, recommend starting at 20 ml/hr, titrating by 10 mL q 8 hours until goal of 60 ml/hr TF at goal rate provides 2320 kcals, 130 g of protein and 1094 mL of free water  Recommend additional free water flush of 200 mL q 4 hours to meet hydration needs: free water flush provides 1200 mL of free water  Recommend addition of Juven BID four wound healing once able to utilize GI tract, each packet provides 80 calories, 8 grams of carbohydrate, 2.5  grams of protein (collagen), 7 grams of L-arginine and 7 grams of L-glutamine; supplement contains CaHMB, Vitamins C, E, B12 and Zinc to promote wound healing   NUTRITION DIAGNOSIS:   Increased nutrient needs related to chronic illness as evidenced by estimated needs.  GOAL:   Patient will meet greater than or equal to 90% of their needs  MONITOR:   Vent status, Skin, I & O's, Labs  REASON FOR ASSESSMENT:   Consult Enteral/tube feeding initiation and management  ASSESSMENT:   67 yo male admitted with possible GI Bleed from [facility]; pt with hx of chronic respiratory failure requiring chronic trach/vent. Noted pt with prolonged hospital stay on October for pneumonia, severe MR, pulmonary edema requiring trach/PEG placement. PMH includes HTN, depression/amxiety, OCD  12/13 Re-admitted from [facility] for GI bleed  Pt on vent support via trach  PEG tube in place; +coffee ground and bilious emesis today, liquid stool  GI evaluated, plan for EGD tomorrow  Noted pt with hx of moderate malnutrition; plan for nutrition focused physical exam on follow-up  LR started yesterday at 75 ml/hr x 24  hours  Noted pt with unstageable wound to sacrum  Labs: potassium 3.0 (L), BUN 60, Creatinine 1.50, CBG 94, Hgb down to 7.7 Meds: reviewed  NUTRITION - FOCUSED PHYSICAL EXAM:  Unable to assess  Diet Order:   Diet Order             Diet NPO time specified  Diet effective now                   EDUCATION NEEDS:   Not appropriate for education at this time  Skin:  Skin Integrity Issues:: Unstageable Unstageable: sacrum  Last BM:  12/14  Height:   Ht Readings from Last 1 Encounters:  08/14/23 6\' 1"  (1.854 m)    Weight:   Wt Readings from Last 1 Encounters:  08/14/23 86.6 kg     BMI:  Body mass index is 25.19 kg/m.  Estimated Nutritional Needs:   Kcal:  2200-2400 kcals  Protein:  125-150 g  Fluid:  >/= 2L   Romelle Starcher MS, RDN, LDN, CNSC Registered Dietitian 3 Clinical Nutrition RD Inpatient Contact Info in Amion

## 2023-08-14 NOTE — Progress Notes (Signed)
eLink Physician-Brief Progress Note Patient Name: Vibhav Cubias DOB: April 29, 1956 MRN: [id number]   Date of Service  08/14/2023  HPI/Events of Note  67 yo M w/ pertinent PMH chronic respiratory failure w/ [facility], Afib on eliquis presents to [facility] ED on 12/13 w/ GIB.   Complaining of hiccups and burping on the ventilator.  Has a tracheostomy in place.  Appears comfortable on camera examination    eICU Interventions  Ventilator shows no evidence of dyssynchrony and he appears comfortable at bedside.  Hiccups/burping may be secondary to his underlying duodenitis.  If symptoms become intolerable/persistent, can try one-time Robaxin   0416 - Kcl ordered   [phone] - feels as if his trach is not positioned right. Burping more and more anxious. Cxr pending  Intervention Category Minor Interventions: Routine modifications to care plan (e.g. PRN medications for pain, fever)  Lynnleigh Soden 08/14/2023, 8:07 PM

## 2023-08-14 NOTE — Progress Notes (Signed)
At approximately 1520, family including sister, and son approached primary RN with a request for a meeting.   Sister said "let me tell you something" while pointing a finger at RN.  Primary RN asked family to the family waiting room to discuss concerns.   Family was upset and claimed Primary RN was not assessing patient and not proving pain management.    Carly RN as witness, Actuary informed.

## 2023-08-14 NOTE — Progress Notes (Signed)
PIV consult: has 2 PIVs. Recommend central line for any prolonged intravenous needs due to pt's hx of RUE DVT and LUE superficial thrombus.

## 2023-08-14 NOTE — H&P (View-Only) (Signed)
Consultation  Referring Provider: Dr. Rennis Chris     Primary Care Physician:  [name], MD Primary Gastroenterologist:    [facility]     Reason for Consultation:    GI bleed?         HPI:   Joshua Eaton is a 67 y.o. male with a past medical history of A-fib, history of DVT, right hemicolectomy for colon cancer, respiratory failure with chronic tracheostomy, who was recently admitted and discharged to [facility].    Admission 10/3-10/28 at [facility] for 2-week admission at [facility] in September for pneumonia, complicated by acute cardiogenic pulmonary edema, severe mitral regurg recently diagnosed but not a candidate for valve replacement due to overall frailty, underwent tracheostomy and PEG placement during his prior admission was [facility].  Later that day had accidental tracheostomy dislodgment his trach stayed out, the following day he developed increased work of breathing and transferred to the ED on 10/31.  Ultimately required reintubation and workup for possible bowel perforation which was ruled out.  Treated for ventilator associated pneumonia and placed back on the ventilator with positive MRSA sputum culture and treated with Vancomycin.  Transferred to LTAC on 11/17 for vent weaning.      Most recently patient transferred back to the ED on 08/13/2023 for concerns of GI bleed.  Family at bedside reported episode of vomiting and abdominal x-ray showed no obstruction.  He underwent CTA of the abdomen pelvis with findings suggestive of an active GI bleed along the posterior gastric antrum/pylorus.  Of note patient has been having bloody sputum from the tracheostomy.    Today, patient unable to answer questions due to trach, but is able to nod his head yes or no.  His wife is in the corner of the room also helps with history.  They are somewhat concerned given patient's lengthy stay here at the hospital and the care he has been receiving  at [facility] specialty.  Per them patient had an episode of vomiting that was "quite violent" on Thursday, 08/12/2023.  This prompted imaging with suspected GI bleed as below.  Since arriving here on the unit nursing staff reports 2 small melenic looking stools.  His output from G-tube is a dark green in color, has not been hemocculted.  He is having some bloody sputum from his trach.  Otherwise things seem fairly stable.    Family denies any signs of abdominal pain.      ER course: Potassium 3.0, BUN 60 (baseline around 30 since 08/06/2023), creatinine 1.5, hemoglobin 8.7 (10.1 on 08/13/2023), MCV normal, platelets normal.  White count 13 (15.8 on 08/13/2023); 08/12/2023 CT angio for bleed was motion degraded, suspected active GI bleeding on the posterior gastric antrum/pylorus, percutaneous gastrostomy in satisfactory position, secondary inflammatory changes in the third and fourth portion of the duodenum suggesting duodenitis and a small left pleural effusion  GI history: 02/23/2017 colonoscopy: Done at [facility], cannot see report  Past Medical History:  Diagnosis Date   Anxiety    Cancer (HCC)    Depression    Hypertension    OCD (obsessive compulsive disorder)     Past Surgical History:  Procedure Laterality Date   CHOLECYSTECTOMY     COLON SURGERY     cancerous polyp removed   HERNIA REPAIR     IR GASTROSTOMY TUBE MOD SED  06/24/2023    History reviewed. No pertinent family history.   Social History  Tobacco Use   Smoking status: Never    Passive exposure: Never   Smokeless tobacco: Never  Vaping Use   Vaping status: Never Used  Substance Use Topics   Alcohol use: No   Drug use: No    Prior to Admission medications   Medication Sig Start Date End Date Taking? Authorizing Provider  acetaminophen (TYLENOL) 325 MG tablet Place 650 mg into feeding tube every 6 (six) hours as needed for moderate pain (pain score 4-6).   Yes [provider]  amiodarone (PACERONE)  200 MG tablet Place 1 tablet (200 mg total) into feeding tube daily. 07/14/23  Yes Rocky Morel, DO  insulin lispro (HUMALOG) 100 UNIT/ML injection Inject 0-12 Units into the skin every 6 (six) hours. <70          Initiate hypoglycemia treatmen 70-150     no insulin 151-200   2 units 201-250   4 units 251-300   6 units 301-350   8 units 351-400   10 units >400        12 units and call medical provider   Yes [provider]  ipratropium-albuterol (DUONEB) 0.5-2.5 (3) MG/3ML SOLN Take 3 mLs by nebulization every 4 (four) hours as needed (sob/wheezing).   Yes [provider]  lidocaine 2% Take 2 mLs by nebulization every 4 (four) hours as needed (For cough). 08/09/23  Yes [provider]  LORazepam (ATIVAN) 2 MG/ML injection Inject 2 mg into the vein every 8 (eight) hours as needed for anxiety. 08/12/23 08/26/23 Yes [provider]  meropenem (MERREM) 1 g injection Inject 1 g into the vein every 8 (eight) hours. Administer over 3 minutes by small volume IV push 08/12/23 08/19/23 Yes [provider]  Mouthwashes (BIOTENE DRY MOUTH MT) Use as directed 1 spray in the mouth or throat as needed (Dry mouth).   Yes [provider]  ondansetron (ZOFRAN) 4 MG/2ML SOLN injection Inject 4 mg into the vein every 6 (six) hours as needed for nausea or vomiting. 08/12/23  Yes [provider]  oxyCODONE (OXY IR/ROXICODONE) 5 MG immediate release tablet Place 5 mg into feeding tube every 4 (four) hours as needed for severe pain (pain score 7-10). 08/09/23 08/23/23 Yes [provider]  pantoprazole (PROTONIX) 40 MG injection Inject 40 mg into the vein 2 (two) times daily. 08/13/23  Yes [provider]  potassium chloride SA (KLOR-CON M) 20 MEQ tablet Take 1 tablet (20 mEq total) by mouth daily. Or every 4 hours as needed for potassium electrolyte replacement. Patient taking differently: Take 40 mEq by mouth 2 (two) times daily. 07/13/23   Yes Rocky Morel, DO  QUEtiapine (SEROQUEL) 50 MG tablet Place 0.5 tablets (25 mg total) into feeding tube every morning. Patient taking differently: Place 50 mg into feeding tube 3 (three) times daily. 07/14/23  Yes Rocky Morel, DO  sertraline (ZOLOFT) 100 MG tablet TAKE 2 TABLETS BY MOUTH IN THE  MORNING AND 1 TABLET BY MOUTH IN THE EVENING 08/12/23  Yes Cottle, Steva Ready., MD  silver sulfADIAZINE (SILVADENE) 1 % cream Apply 1 Application topically as needed (For wound care).   Yes [provider]  vancomycin 1,000 mg in sodium chloride 0.9 % 250 mL Inject 1,000 mg into the vein every 12 (twelve) hours. 08/12/23 08/19/23 Yes [provider]  zolpidem (AMBIEN) 5 MG tablet Place 5 mg into feeding tube at bedtime.   Yes [provider]  apixaban (ELIQUIS) 5 MG TABS tablet Place 1  tablet (5 mg total) into feeding tube 2 (two) times daily. Patient not taking: Reported on 08/14/2023 06/28/23   Rhetta Mura, MD  Chlorhexidine Gluconate Cloth 2 % PADS Apply 6 each topically daily. 07/14/23   Rocky Morel, DO  Electrolyte-A in Dextrose (DEXTROSE 50%/ELECTROLYTES IV) Inject 25 g into the vein daily as needed (blood sugar 50mg /dL).    [provider]  Glucagon HCl 1 MG SOLR Inject 1 mg as directed once as needed (hypoglycemia).    [provider]  insulin aspart (NOVOLOG) 100 UNIT/ML injection Inject 0-15 Units into the skin every 4 (four) hours. Patient not taking: Reported on 08/14/2023 07/13/23   Rocky Morel, DO  lip balm (CARMEX) ointment Apply topically as needed. 07/13/23   Rocky Morel, DO  magnesium oxide (MAG-OX) 400 (240 Mg) MG tablet Place 400 mg into feeding tube every 6 (six) hours as needed (magnesium electrolyte replacement).    [provider]  nutrition supplement, JUVEN, (JUVEN) PACK Place 1 packet into feeding tube 2 (two) times daily between meals. 07/13/23   Rocky Morel, DO  Nutritional Supplements  (FEEDING SUPPLEMENT, JEVITY 1.5 CAL/FIBER,) LIQD Place 1,000 mLs into feeding tube continuous. 07/13/23   Rocky Morel, DO  Protein (FEEDING SUPPLEMENT, PROSOURCE TF20,) liquid Place 60 mLs into feeding tube daily. 07/14/23   Rocky Morel, DO    Current Facility-Administered Medications  Medication Dose Route Frequency Provider Last Rate Last Admin   acetaminophen (TYLENOL) tablet 650 mg  650 mg Oral Q6H PRN Tu, Ching T, DO       amiodarone (PACERONE) tablet 200 mg  200 mg Per Tube Daily Tu, Ching T, DO       Chlorhexidine Gluconate Cloth 2 % PADS 6 each  6 each Topical Daily Dezii, Alexandra, DO   6 each at 08/14/23 1014   ipratropium-albuterol (DUONEB) 0.5-2.5 (3) MG/3ML nebulizer solution 3 mL  3 mL Nebulization Q4H PRN Lidia Collum, PA-C       lactated ringers infusion   Intravenous Continuous Tu, Ching T, DO   Stopped at 08/14/23 0320   LORazepam (ATIVAN) injection 1 mg  1 mg Intravenous Q8H PRN Tu, Ching T, DO       Oral care mouth rinse  15 mL Mouth Rinse Q2H Dezii, Alexandra, DO   15 mL at 08/14/23 1014   Oral care mouth rinse  15 mL Mouth Rinse PRN Dezii, Alexandra, DO       oxyCODONE (Oxy IR/ROXICODONE) immediate release tablet 5 mg  5 mg Oral Q4H PRN Tu, Ching T, DO       pantoprazole (PROTONIX) injection 40 mg  40 mg Intravenous Q12H Prosperi, Christian H, PA-C   40 mg at 08/14/23 0028   sertraline (ZOLOFT) tablet 100 mg  100 mg Oral Daily Tu, Ching T, DO       zolpidem (AMBIEN) tablet 5 mg  5 mg Oral QHS PRN Tu, Ching T, DO        Allergies as of 08/13/2023 - Reviewed 08/13/2023  Allergen Reaction Noted   Codeine  10/06/2015     Review of Systems:    Unable to answer questions    Physical Exam:  Vital signs in last 24 hours: Temp:  [98.4 F (36.9 C)-98.7 F (37.1 C)] 98.4 F (36.9 C) (12/14 1013) Pulse Rate:  [62-84] 72 (12/14 1014) Resp:  [15-23] 20 (12/14 1014) BP: (104-131)/(43-68) 123/51 (12/14 1014) SpO2:  [98 %-100 %] 100 % (12/14 1014) FiO2 (%):  [40  %] 40 % (  12/14 1104) Weight:  [86.6 kg] 86.6 kg (12/14 0453) Last BM Date : 08/14/23 General:   Pleasant chronically ill-appearing Caucasian male appears to be in NAD, Well developed, Well nourished, alert and cooperative Head:  Normocephalic and atraumatic. Eyes:   PEERL, EOMI. No icterus. Conjunctiva pink. Ears:  Normal auditory acuity. Neck:  Supple Throat: Oral cavity and pharynx without inflammation, swelling or lesion. Teeth in good condition. + Tracheostomy with some bloody sputum Lungs: Respirations even and unlabored. Lungs clear to auscultation bilaterally.   No wheezes, crackles, or rhonchi.  Heart: Normal S1, S2. No MRG. Regular rate and rhythm. No peripheral edema, cyanosis or pallor.  Abdomen:  Soft, nondistended, nontender. No rebound or guarding. Normal bowel sounds. No appreciable masses or hepatomegaly. +G-tube-output is a dark grainy material, 50 cc currently but apparently just emptied 500 cc per nursing staff Rectal:  Not performed.  Msk:  Symmetrical without gross deformities. Peripheral pulses intact.  Extremities:  Without edema, no deformity or joint abnormality. Normal ROM, normal sensation. Neurologic:  Alert   grossly normal neurologically.  Skin:   Dry and intact without significant lesions or rashes. Psychiatric: Demonstrates good judgement and reason  LAB RESULTS: Recent Labs    08/13/23 0711 08/13/23 1732 08/13/23 2157 08/14/23 0456  WBC 17.6* 15.8*  --  13.0*  HGB 10.9* 10.1* 8.8* 8.7*  HCT 35.0* 32.6* 26.0* 27.8*  PLT 216 212  --  196   BMET Recent Labs    08/12/23 0316 08/13/23 0936 08/13/23 2157 08/14/23 0456  NA 142 141 142 139  K 4.3 3.8 3.2* 3.0*  CL 99 98  --  101  CO2 35* 33*  --  33*  GLUCOSE 128* 128*  --  101*  BUN 40* 55*  --  60*  CREATININE 0.99 1.52*  --  1.50*  CALCIUM 8.8* 8.5*  --  7.9*   LFT Recent Labs    08/13/23 0936  PROT 5.0*  ALBUMIN 1.6*  AST 20  ALT 15  ALKPHOS 42  BILITOT 1.1   PT/INR Recent Labs     08/13/23 0936  LABPROT 16.7*  INR 1.3*    STUDIES: CT Angio Chest Pulmonary Embolism (PE) W or WO Contrast Result Date: 08/13/2023 CLINICAL DATA:  Shortness of breath, hemoptysis. EXAM: CT ANGIOGRAPHY CHEST WITH CONTRAST TECHNIQUE: Multidetector CT imaging of the chest was performed using the standard protocol during bolus administration of intravenous contrast. Multiplanar CT image reconstructions and MIPs were obtained to evaluate the vascular anatomy. RADIATION DOSE REDUCTION: This exam was performed according to the departmental dose-optimization program which includes automated exposure control, adjustment of the mA and/or kV according to patient size and/or use of iterative reconstruction technique. CONTRAST:  75mL OMNIPAQUE IOHEXOL 350 MG/ML SOLN COMPARISON:  08/04/2023 FINDINGS: Cardiovascular: No filling defects in the pulmonary arteries to suggest pulmonary emboli. Cardiomegaly. Aorta normal caliber. Mediastinum/Nodes: No mediastinal, hilar, or axillary adenopathy. Tracheostomy tube in the midtrachea, unchanged. Thyroid and esophagus unremarkable. Lungs/Pleura: Improving bilateral airspace disease. Small left effusion. Bronchiectasis in the lower lobes with dependent airspace opacities, left greater than right. Scattered ground-glass opacities throughout the lungs. Upper Abdomen: Perihepatic and perisplenic ascites. Musculoskeletal: Chest wall soft tissues are unremarkable. No acute bony abnormality. Review of the MIP images confirms the above findings. IMPRESSION: No evidence of pulmonary embolus. Cardiomegaly. Improving bilateral airspace disease. Continued ground-glass opacities which could reflect edema and bibasilar airspace opacities, left greater than right, atelectasis versus infiltrates. Small left effusion. Small amount of upper abdominal ascites. Aortic Atherosclerosis (  ICD10-I70.0). Electronically Signed   By: Charlett Nose M.D.   On: 08/13/2023 02:35   CT ANGIO GI BLEED Result  Date: 08/12/2023 CLINICAL DATA:  GI bleed, shortness of breath EXAM: CTA ABDOMEN AND PELVIS WITHOUT AND WITH CONTRAST TECHNIQUE: Multidetector CT imaging of the abdomen and pelvis was performed using the standard protocol during bolus administration of intravenous contrast. Multiplanar reconstructed images and MIPs were obtained and reviewed to evaluate the vascular anatomy. RADIATION DOSE REDUCTION: This exam was performed according to the departmental dose-optimization program which includes automated exposure control, adjustment of the mA and/or kV according to patient size and/or use of iterative reconstruction technique. CONTRAST:  OMNIPAQUE IOHEXOL 350 MG/ML SOLN COMPARISON:  Partial comparison to CT chest dated 08/14/2023. CT abdomen/pelvis dated 07/02/2023. FINDINGS: Motion degraded images. Lower chest: Small left pleural effusion with associated left lower lobe opacity, atelectasis versus pneumonia. Additional ground-glass opacities in the visualized lungs, mildly improved, favoring mild interstitial edema. Hepatobiliary: Liver is within normal limits. Status post cholecystectomy. No intrahepatic or extrahepatic dilatation. Pancreas: Within normal limits. Spleen: Within normal limits. Adrenals/Urinary Tract: Adrenal glands are within normal limits. Kidneys are within normal limits.  No hydronephrosis. Bladder is within normal limits. Stomach/Bowel: Percutaneous gastrostomy in satisfactory position. Following contrast administration, there is intraluminal spillage of contrast along the posterior gastric antrum and pylorus (series 7/images 66-70), suggesting the site of active GI bleeding. Secondary inflammatory changes involving the 3rd/4th portion of the duodenum (series 7/image 13), suggesting duodenitis. No evidence of bowel obstruction. Status post right hemicolectomy with appendectomy. Mild sigmoid diverticulosis, without evidence of diverticulitis. Vascular/Lymphatic: No evidence of abdominal  aortic aneurysm. Atherosclerotic calcifications of the abdominal aorta and branch vessels, although vessels remain patent. No suspicious abdominopelvic lymphadenopathy. Reproductive: Prostate is unremarkable. Other: Trace right pelvic ascites. Musculoskeletal: Mild degenerative changes of the lumbar spine. IMPRESSION: Motion degraded images. Suspected active GI bleeding along the posterior gastric antrum/pylorus, as above. Percutaneous gastrostomy in satisfactory position. Secondary inflammatory changes involving the 3rd/4th portion of the duodenum, suggesting duodenitis. Small left pleural effusion with associated left lower lobe opacity, atelectasis versus pneumonia. Additional ground-glass opacities in the visualized lungs, mildly improved, favoring mild interstitial edema. Electronically Signed   By: Charline Bills M.D.   On: 08/12/2023 18:59    Impression / Plan:   Impression: 1.  Acute GI bleeding: Patient with CTA on 08/12/2023 with signs of bleeding from the posterior stomach, does have G-tube in place and anterior stomach, did pass 2 melenic stools today, otherwise no prior signs of GI bleeding, G-tube producing dark green material, does have some bloody sputum from tracheostomy; upper GI bleed 2.  Tracheostomy in place and ventilator dependent 3.  History of ONG:EXBMWUX on hold 4.  Hypokalemia 5.  Unstageable sacral decubitus ulcer 6.  Severe mitral regurgitation 7.  History of right hemicolectomy secondary to colon cancer  Plan: 1.  Patient will need an EGD.  We will plan on this tomorrow after patient's potassium has been corrected and he is resuscitated some overnight.  Discussed this with the patient's wife by bedside.  She agrees to procedure. 2.  Patient to remain n.p.o. after midnight 3.  Continue monitor hemoglobin and transfuse as needed less than 7 4.  Appreciate hospitalist team's help with hypokalemia  Thank you for your kind consultation, we will continue to  follow.  Violet Baldy Tabitha Tupper  08/14/2023, 11:08 AM

## 2023-08-14 NOTE — ED Notes (Signed)
ED TO INPATIENT HANDOFF REPORT  ED Nurse Name and Phone #: Beatris Ship RN [phone]  S Name/Age/Gender Joshua Eaton 67 y.o. male Room/Bed: 005C/005C  Code Status   Code Status: Full Code  [facility]SNF/Other [facility] Patient oriented to: self, place, time, and situation Is this baseline? Yes   Triage Complete: Triage complete  Chief Complaint Acute GI bleeding [K92.2]  [facility] Note [facility] and [facility]   Allergies Allergies  Allergen Reactions   Codeine     "sends me into orbit"    Level of Care/Admitting Diagnosis ED Disposition     ED Disposition  Admit   Condition  --   Comment  Hospital Area: [facility] [[id number]]  Level of Care: Progressive [102]  Admit to Progressive based on following criteria: GI, ENDOCRINE disease patients with GI bleeding, acute liver failure or pancreatitis, stable with diabetic ketoacidosis or thyrotoxicosis (hypothyroid) state.  May place patient in observation at [facility] or [facility] if equivalent level of care is available:: No  Covid Evaluation: Asymptomatic - no recent exposure (last 10 days) testing not required  Diagnosis: Acute GI bleeding [253168]  Admitting Physician: Anselm Jungling [[id number]]  Attending Physician: Anselm Jungling [[id number]]          B Medical/Surgery History Past Medical History:  Diagnosis Date   Anxiety    Cancer (HCC)    Depression    Hypertension    OCD (obsessive compulsive disorder)    Past Surgical History:  Procedure Laterality Date   CHOLECYSTECTOMY     COLON SURGERY     cancerous polyp removed   HERNIA REPAIR     IR GASTROSTOMY TUBE MOD SED  06/24/2023     A IV Location/Drains/Wounds Patient Lines/Drains/Airways Status     Active Line/Drains/Airways     Name Placement date Placement time Site Days   Peripheral IV 07/12/23 22 G Left Hand 07/12/23  1214  Hand  33   Peripheral IV 08/12/23 18 G Right Antecubital 08/12/23  1819   Antecubital  2   Peripheral IV 08/09/23 20 G Anterior;Left Forearm 08/09/23  1520  Forearm  5   Peripheral IV 08/14/23 20 G 1.88" Posterior;Right Forearm 08/14/23  0530  Forearm  less than 1   Gastrostomy/Enterostomy Gastrostomy 24 Fr. LUQ 06/24/23  0826  LUQ  51   Tracheostomy Shiley XLT Distal 6 mm Cuffed 08/09/23  0941  6 mm  5   Pressure Injury 07/02/23 Sacrum Posterior;Medial Stage 3 -  Full thickness tissue loss. Subcutaneous fat may be visible but bone, tendon or muscle are NOT exposed. 07/02/23  0007  -- 43   Pressure Injury 07/02/23 Anus Stage 2 -  Partial thickness loss of dermis presenting as a shallow open injury with a red, pink wound bed without slough. 07/02/23  0007  -- 43   Wound / Incision (Open or Dehisced) 07/02/23 Irritant Dermatitis (Moisture Associated Skin Damage) Sacrum Lower 07/02/23  0008  Sacrum  43            Intake/Output Last 24 hours  Intake/Output Summary (Last 24 hours) at 08/14/2023 0741 Last data filed at 08/14/2023 0211 Gross per 24 hour  Intake 32.56 ml  Output --  Net 32.56 ml    Labs/Imaging Results for orders placed or performed during the hospital encounter of 08/13/23 (from the past 48 hours)  I-Stat arterial blood gas, ED     Status: Abnormal   Collection Time: 08/13/23  9:57  PM  Result Value Ref Range   pH, Arterial 7.501 (H) 7.35 - 7.45   pCO2 arterial 50.0 (H) 32 - 48 mmHg   pO2, Arterial 90 83 - 108 mmHg   Bicarbonate 39.1 (H) 20.0 - 28.0 mmol/L   TCO2 41 (H) 22 - 32 mmol/L   O2 Saturation 97 %   Acid-Base Excess 14.0 (H) 0.0 - 2.0 mmol/L   Sodium 142 135 - 145 mmol/L   Potassium 3.2 (L) 3.5 - 5.1 mmol/L   Calcium, Ion 1.16 1.15 - 1.40 mmol/L   HCT 26.0 (L) 39.0 - 52.0 %   Hemoglobin 8.8 (L) 13.0 - 17.0 g/dL   Patient temperature 16.1 C    Collection site RADIAL, ALLEN'S TEST ACCEPTABLE    Drawn by RT    Sample type ARTERIAL   CBC     Status: Abnormal   Collection Time: 08/14/23  4:56 AM  Result Value Ref Range   WBC  13.0 (H) 4.0 - 10.5 K/uL   RBC 2.86 (L) 4.22 - 5.81 MIL/uL   Hemoglobin 8.7 (L) 13.0 - 17.0 g/dL   HCT 09.6 (L) 04.5 - 40.9 %   MCV 97.2 80.0 - 100.0 fL   MCH 30.4 26.0 - 34.0 pg   MCHC 31.3 30.0 - 36.0 g/dL   RDW 81.1 (H) 91.4 - 78.2 %   Platelets 196 150 - 400 K/uL   nRBC 0.0 0.0 - 0.2 %    Comment: Performed at [facility], [address], [place], [place] [address]  Basic metabolic panel     Status: Abnormal   Collection Time: 08/14/23  4:56 AM  Result Value Ref Range   Sodium 139 135 - 145 mmol/L   Potassium 3.0 (L) 3.5 - 5.1 mmol/L   Chloride 101 98 - 111 mmol/L   CO2 33 (H) 22 - 32 mmol/L   Glucose, Bld 101 (H) 70 - 99 mg/dL    Comment: Glucose reference range applies only to samples taken after fasting for at least 8 hours.   BUN 60 (H) 8 - 23 mg/dL   Creatinine, Ser 3.08 (H) 0.61 - 1.24 mg/dL   Calcium 7.9 (L) 8.9 - 10.3 mg/dL   GFR, Estimated 50 (L) >60 mL/min    Comment: (NOTE) Calculated using the CKD-EPI Creatinine Equation (2021)    Anion gap 5 5 - 15    Comment: Performed at [facility], [address], [place], [place] [address]   CT Angio Chest Pulmonary Embolism (PE) W or WO Contrast Result Date: 08/13/2023 CLINICAL DATA:  Shortness of breath, hemoptysis. EXAM: CT ANGIOGRAPHY CHEST WITH CONTRAST TECHNIQUE: Multidetector CT imaging of the chest was performed using the standard protocol during bolus administration of intravenous contrast. Multiplanar CT image reconstructions and MIPs were obtained to evaluate the vascular anatomy. RADIATION DOSE REDUCTION: This exam was performed according to the departmental dose-optimization program which includes automated exposure control, adjustment of the mA and/or kV according to patient size and/or use of iterative reconstruction technique. CONTRAST:  75mL OMNIPAQUE IOHEXOL 350 MG/ML SOLN COMPARISON:  08/04/2023 FINDINGS: Cardiovascular: No filling defects in the pulmonary arteries to suggest pulmonary emboli.  Cardiomegaly. Aorta normal caliber. Mediastinum/Nodes: No mediastinal, hilar, or axillary adenopathy. Tracheostomy tube in the midtrachea, unchanged. Thyroid and esophagus unremarkable. Lungs/Pleura: Improving bilateral airspace disease. Small left effusion. Bronchiectasis in the lower lobes with dependent airspace opacities, left greater than right. Scattered ground-glass opacities throughout the lungs. Upper Abdomen: Perihepatic and perisplenic ascites. Musculoskeletal: Chest wall  soft tissues are unremarkable. No acute bony abnormality. Review of the MIP images confirms the above findings. IMPRESSION: No evidence of pulmonary embolus. Cardiomegaly. Improving bilateral airspace disease. Continued ground-glass opacities which could reflect edema and bibasilar airspace opacities, left greater than right, atelectasis versus infiltrates. Small left effusion. Small amount of upper abdominal ascites. Aortic Atherosclerosis (ICD10-I70.0). Electronically Signed   By: Charlett Nose M.D.   On: 08/13/2023 02:35   CT ANGIO GI BLEED Result Date: 08/12/2023 CLINICAL DATA:  GI bleed, shortness of breath EXAM: CTA ABDOMEN AND PELVIS WITHOUT AND WITH CONTRAST TECHNIQUE: Multidetector CT imaging of the abdomen and pelvis was performed using the standard protocol during bolus administration of intravenous contrast. Multiplanar reconstructed images and MIPs were obtained and reviewed to evaluate the vascular anatomy. RADIATION DOSE REDUCTION: This exam was performed according to the departmental dose-optimization program which includes automated exposure control, adjustment of the mA and/or kV according to patient size and/or use of iterative reconstruction technique. CONTRAST:  OMNIPAQUE IOHEXOL 350 MG/ML SOLN COMPARISON:  Partial comparison to CT chest dated 08/14/2023. CT abdomen/pelvis dated 07/02/2023. FINDINGS: Motion degraded images. Lower chest: Small left pleural effusion with associated left lower lobe opacity,  atelectasis versus pneumonia. Additional ground-glass opacities in the visualized lungs, mildly improved, favoring mild interstitial edema. Hepatobiliary: Liver is within normal limits. Status post cholecystectomy. No intrahepatic or extrahepatic dilatation. Pancreas: Within normal limits. Spleen: Within normal limits. Adrenals/Urinary Tract: Adrenal glands are within normal limits. Kidneys are within normal limits.  No hydronephrosis. Bladder is within normal limits. Stomach/Bowel: Percutaneous gastrostomy in satisfactory position. Following contrast administration, there is intraluminal spillage of contrast along the posterior gastric antrum and pylorus (series 7/images 66-70), suggesting the site of active GI bleeding. Secondary inflammatory changes involving the 3rd/4th portion of the duodenum (series 7/image 13), suggesting duodenitis. No evidence of bowel obstruction. Status post right hemicolectomy with appendectomy. Mild sigmoid diverticulosis, without evidence of diverticulitis. Vascular/Lymphatic: No evidence of abdominal aortic aneurysm. Atherosclerotic calcifications of the abdominal aorta and branch vessels, although vessels remain patent. No suspicious abdominopelvic lymphadenopathy. Reproductive: Prostate is unremarkable. Other: Trace right pelvic ascites. Musculoskeletal: Mild degenerative changes of the lumbar spine. IMPRESSION: Motion degraded images. Suspected active GI bleeding along the posterior gastric antrum/pylorus, as above. Percutaneous gastrostomy in satisfactory position. Secondary inflammatory changes involving the 3rd/4th portion of the duodenum, suggesting duodenitis. Small left pleural effusion with associated left lower lobe opacity, atelectasis versus pneumonia. Additional ground-glass opacities in the visualized lungs, mildly improved, favoring mild interstitial edema. Electronically Signed   By: Charline Bills M.D.   On: 08/12/2023 18:59    Pending Labs Unresulted Labs  (From admission, onward)     Start     Ordered   08/13/23 2006  Blood gas, arterial  Once,   R        08/13/23 2005            Vitals/Pain Today's Vitals   08/14/23 0600 08/14/23 0630 08/14/23 0700 08/14/23 0708  BP: (!) 111/48 (!) 112/45 (!) 112/45 (!) 118/46  Pulse: 62 65 67 65  Resp: 18 (!) 21 20 16   Temp:    98.7 F (37.1 C)  TempSrc:    Axillary  SpO2: 100% 100% 100% 100%  Weight:      Height:      PainSc:        Isolation Precautions No active isolations  Medications Medications  pantoprazole (PROTONIX) injection 40 mg (40 mg Intravenous Given 08/14/23 0028)  ipratropium-albuterol (DUONEB) 0.5-2.5 (3) MG/3ML  nebulizer solution 3 mL (has no administration in time range)  lactated ringers infusion (0 mLs Intravenous Stopped 08/14/23 0320)  acetaminophen (TYLENOL) tablet 650 mg (has no administration in time range)  amiodarone (PACERONE) tablet 200 mg (has no administration in time range)  sertraline (ZOLOFT) tablet 100 mg (has no administration in time range)  zolpidem (AMBIEN) tablet 5 mg (has no administration in time range)  LORazepam (ATIVAN) injection 1 mg (has no administration in time range)  oxyCODONE (Oxy IR/ROXICODONE) immediate release tablet 5 mg (has no administration in time range)    Mobility non-ambulatory     Focused Assessments Pulmonary Assessment Handoff:  Lung sounds: Bilateral Breath Sounds: Clear, Diminished L Breath Sounds: Rhonchi, Coarse crackles R Breath Sounds: Clear O2 Device: Tracheostomy Collar      R Recommendations: See Admitting Provider Note  Report given to: 2C16

## 2023-08-14 NOTE — Progress Notes (Signed)
RT assisted with transport of this pt from ED to 2M while on full ventilatory support. Pt tolerated well with SVS and no complications. 2M RT given report.

## 2023-08-14 NOTE — ED Notes (Addendum)
Discussed with Dr. Antionette Char regarding pt left forearm swollen and severely tender to the touch, pt has IV in this forearm and reports severe pain with flushing, IV also has significant resistance with flushing. 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 Also informed that pt has been coughing more frequently, and sputum has gone from thick clear with blood and blood clots, to very light pink/red and frothy and has increased in volume. Pt has also had several dark brown/black and watery bowel movements with foul odor and concerns of further breakdown to unstageable pressure wound to pt sacrum addressed. Pt vital signs remain stable and patient in no significant distress at this time.  Per physician okay to stop IVF at this time d/t concerns of worsening fluid overload, and fluids stopped at this time. Also pt not a candidate for FMS d/t GI Bleeding issues, and informed that wound nurse consult placed to address sacral pressure ulcer management.  Pt wife at bedside and provided updates in pt plan of care at this time, denies any further needs or concerns.

## 2023-08-14 NOTE — Progress Notes (Signed)
PROGRESS NOTE    Joshua Eaton  ZOX:[id number] DOB: Sep 06, 1955 DOA: 08/13/2023 PCP: [name], MD  Chief Complaint  Patient presents with   Weakness   Abnormal Lab    Hospital Course:  Joshua Eaton is 67 y.o. male with paroxysmal atrial fibrillation, history of DVT, right hemicolectomy for colon cancer, respiratory failure with chronic tracheostomy, who was recently admitted and discharged to [facility]. Recent admission from 10/3-10/28 at [facility] after 2-week admission at [facility] in September for pneumonia, complicated by RNs and acute cardiogenic pulmonary edema.  He also has severe mitral regurg recently diagnosed but reportedly is not a candidate for valve replacement due to overall frailty.  He underwent tracheostomy and PEG placement during his prior [facility]  Later that day he had accidental tracheostomy dislodgment and his trach stayed out.  The following day he developed increased work of breathing and transferred to the ED on 10/31, ultimately required reintubation and workup for possible bowel perforation which was ruled out.  He was then treated for ventilator associated pneumonia and placed back on the ventilator with positive MRSA sputum culture and treated with vancomycin.  He was transferred to [facility] on 11/17 for vent weaning. He was brought back to the ED on 12/13 due to concerns of a GI bleed.  Family at bedside reported an episode of vomiting.  Family reports vomitus was nonbloody and most consistent with tube feeds.  Abdominal x-ray without obstruction.  He underwent CTA abdomen pelvis with findings suggestive of active GI bleed along with posterior gastric antrum/pylorus.  Dr. Adela Lank with GI was consulted and advised admission for further GI workup.  Of note patient has also been having bloody sputum seen from tracheostomy.  Subjective: On arrival today patient's wife is at bedside, sister is on the phone.  We extensively  discussed his care.  They endorsed their concerns with the care he has been receiving at [facility].  He has also had many dark stools this morning.  Currently PEG tube which is hooked to bag is draining dark green fluid. Gastroenterology present at bedside.  Planning for EGD tomorrow   Objective: Vitals:   08/14/23 0430 08/14/23 0453 08/14/23 0500 08/14/23 0708  BP: (!) 124/53  (!) 114/50 (!) 118/46  Pulse: 75  77 65  Resp: 15  (!) 21 16  Temp:  98.7 F (37.1 C)  98.7 F (37.1 C)  TempSrc:  Oral  Axillary  SpO2: 100%  100% 100%  Weight:  86.6 kg    Height:  6\' 1"  (1.854 m)      Intake/Output Summary (Last 24 hours) at 08/14/2023 0725 Last data filed at 08/14/2023 0211 Gross per 24 hour  Intake 32.56 ml  Output --  Net 32.56 ml   Filed Weights   08/14/23 0453  Weight: 86.6 kg    Examination: General exam: Appears calm and comfortable, NAD  Respiratory system: Tracheostomy in place, attached to ventilator.  Bloody secretions seen in suction tubing. Mouth: Tongue with green/white plaque on more  proximal surface, is able to be wiped away with gauze.  Surface under the tongue reveals some small petechiae. Cardiovascular system: S1 & S2 heard, RRR.  Gastrointestinal system: Abdomen is nondistended, soft and nontender.  Neuro: Alert, is able to follow commands, is able to mouth responses though unable to clearly verbalize. GI: Tube in place, active to catheter bag and Draining dark green fluid Skin: Targetoid lesion over left knee Psychiatry: Calm, does  not appear anxious at this time.  Assessment & Plan:  Principal Problem:   Acute GI bleeding Active Problems:   Acute respiratory failure with hypoxia (HCC)   Severe mitral regurgitation   AKI (acute kidney injury) (HCC)   On mechanically assisted ventilation (HCC)   Tracheostomy in place (HCC)   PEG (percutaneous endoscopic gastrostomy) status (HCC)   Sacral ulcer (HCC)   Paroxysmal atrial fibrillation (HCC)   DVT (deep  venous thrombosis) (HCC)   HTN (hypertension)   Rash  Acute GI bleeding - CT abdomen pelvis LTAC 12/12 demonstrated suspected bleed along the posterior antrum/pylorus. - Complicated by PEG dependency - Hemoglobin 8.7 this morning, down to 7.7 on repeat.  Observed melena today.  Patient has received 2 units of blood in the last 7 days.  Will transfusion additional unit PRBC today. (Consent obtained from wife, Joshua Eaton) - GI Dr. 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 - Currently afebrile, with downtrending leukocytosis.  Respiratory status at baseline.  No indication for antibiotics at this time. - Bronchoscopy was discussed LTAC with family deferred until further investigation of GI bleeding - PCCM consult for vent management while admitted, will discuss further plans for bronchoscopy with them.  He may benefit during this admission once acute GI bleed is handled  Rash, Present on arrival - Large annular targetoid erythematous lesion on anterior left patella.  Family reports lyme Workup has been negative - Monitor closely  Hypotension - Hold antihypertensives at this time, anticipate hypotension for GI bleed.  Preserved reserve  History of DVT - Right upper extremity DVT provoked by PICC line on 10/16 - Eliquis currently on hold due to active GI bleed  Paroxysmal A-fib - Patient was on Eliquis but Appears Eliquis was at least intermittently held for anemia.  Appears he has not been taking the medication at LTAC - Continue to hold Eliquis given acute GI bleed - Will continue with home dose amiodarone  Sacral decubitus ulcer, unstageable -Underwent debridement 12/11 - Continue  with wound care consult, gen surg if needed   PEG status - Dietitian consult - Tube feeds currently on hold due to active GI bleed.  Will resume when cleared by GI - KUB today to confirm placement  AKI - Baseline creatinine appears to be 0.9, 1.52 on arrival -acute worsening likely secondary to blood loss, continue gentle IV fluids and transfusion as above - Trend CMP - Avoid nephrotoxic meds - Renally dose when needed  Severe mitral regurg - Diagnosed in September after presenting to [facility] for pneumonia complicated by RDS and cardiogenic pulmonary edema - Patient deemed not a troponin candidate due to overall frailty  Hypokalemia - Replace as needed  History of right hemicolectomy - Secondary to colon cancer - Follow-up outpatient  Coated tongue - Bedside RN expressed concern for thrush.  Physical exam reveals coated tongue.  Advised on appropriate oral hygiene.  Suspect patient has had dry mouth and poor oral hygiene while at [facility]. - Avoid aggressive debridement, family teaching also performed      DVT prophylaxis: SCDs for now   Code Status: Full Code Family Communication: Full Disposition:  Status is: Inpatient    Consultants:    Treatment Team:  Consulting  Physician: Benancio Deeds, MD PCCM  Procedures:  EGD tomorrow  Antimicrobials:  Anti-infectives (From admission, onward)    None       Data Reviewed: I have personally reviewed following labs and imaging studies CBC: Recent Labs  Lab 08/09/23 0335 08/12/23 0316 08/13/23 0711 08/13/23 1732 08/13/23 2157 08/14/23 0456  WBC 11.1* 13.5* 17.6* 15.8*  --  13.0*  NEUTROABS 8.5* 11.6*  --  11.4*  --   --   HGB 9.2* 8.7* 10.9* 10.1* 8.8* 8.7*  HCT 28.8* 28.2* 35.0* 32.6* 26.0* 27.8*  MCV 96.0 96.9 97.0 97.0  --  97.2  PLT 160 170 216 212  --  196   Basic Metabolic Panel: Recent Labs  Lab 08/08/23 0347 08/12/23 0316 08/13/23 0936 08/13/23 2157 08/14/23 0456  NA 140 142 141 142 139  K  3.7 4.3 3.8 3.2* 3.0*  CL 99 99 98  --  101  CO2 33* 35* 33*  --  33*  GLUCOSE 133* 128* 128*  --  101*  BUN 24* 40* 55*  --  60*  CREATININE 0.86 0.99 1.52*  --  1.50*  CALCIUM 8.5* 8.8* 8.5*  --  7.9*  MG 2.3 2.3  --   --   --   PHOS  --  3.8  --   --   --    GFR: Estimated Creatinine Clearance: 52.5 mL/min (A) (by C-G formula based on SCr of 1.5 mg/dL (H)). Liver Function Tests: Recent Labs  Lab 08/13/23 0936  AST 20  ALT 15  ALKPHOS 42  BILITOT 1.1  PROT 5.0*  ALBUMIN 1.6*   CBG: No results for input(s): "GLUCAP" in the last 168 hours.  Recent Results (from the past 240 hours)  Culture, Respiratory w Gram Stain     Status: None   Collection Time: 08/05/23 10:13 AM   Specimen: Tracheal Aspirate; Respiratory  Result Value Ref Range Status   Specimen Description TRACHEAL ASPIRATE  Final   Special Requests NONE  Final   Gram Stain   Final    MODERATE WBC PRESENT, PREDOMINANTLY PMN FEW GRAM POSITIVE COCCI IN PAIRS FEW GRAM POSITIVE RODS Performed at [facility], [address] [place], [place] [address]    Culture   Final    ABUNDANT STAPHYLOCOCCUS AUREUS MODERATE SERRATIA MARCESCENS    Report Status 08/07/2023 FINAL  Final   Organism ID, Bacteria STAPHYLOCOCCUS AUREUS  Final   Organism ID, Bacteria SERRATIA MARCESCENS  Final      Susceptibility   Staphylococcus aureus - MIC*    CIPROFLOXACIN 1 SENSITIVE Sensitive     ERYTHROMYCIN >=8 RESISTANT Resistant     GENTAMICIN <=0.5 SENSITIVE Sensitive     OXACILLIN 0.5 SENSITIVE Sensitive     TETRACYCLINE <=1 SENSITIVE Sensitive     VANCOMYCIN 1 SENSITIVE Sensitive     TRIMETH/SULFA <=10 SENSITIVE Sensitive     CLINDAMYCIN >=8 RESISTANT Resistant     RIFAMPIN <=0.5 SENSITIVE Sensitive     Inducible Clindamycin NEGATIVE Sensitive     LINEZOLID 2 SENSITIVE Sensitive     * ABUNDANT STAPHYLOCOCCUS AUREUS   Serratia marcescens - MIC*    CEFEPIME 0.25 SENSITIVE Sensitive     CEFTAZIDIME <=1 SENSITIVE Sensitive      CEFTRIAXONE 1 SENSITIVE Sensitive     CIPROFLOXACIN <=0.25 SENSITIVE Sensitive     GENTAMICIN <=1 SENSITIVE Sensitive     TRIMETH/SULFA <=20 SENSITIVE Sensitive     * MODERATE SERRATIA MARCESCENS  Culture, Respiratory w Gram  Stain     Status: None (Preliminary result)   Collection Time: 08/12/23 10:32 AM   Specimen: Tracheal Aspirate; Respiratory  Result Value Ref Range Status   Specimen Description TRACHEAL ASPIRATE  Final   Special Requests NONE  Final   Gram Stain   Final    ABUNDANT WBC PRESENT, PREDOMINANTLY PMN RARE GRAM POSITIVE RODS RARE GRAM POSITIVE COCCI IN PAIRS    Culture   Final    MODERATE STAPHYLOCOCCUS AUREUS SUSCEPTIBILITIES TO FOLLOW Performed at [facility], [address], [place], [place] [address]    Report Status PENDING  Incomplete     Radiology Studies: CT Angio Chest Pulmonary Embolism (PE) W or WO Contrast Result Date: 08/13/2023 CLINICAL DATA:  Shortness of breath, hemoptysis. EXAM: CT ANGIOGRAPHY CHEST WITH CONTRAST TECHNIQUE: Multidetector CT imaging of the chest was performed using the standard protocol during bolus administration of intravenous contrast. Multiplanar CT image reconstructions and MIPs were obtained to evaluate the vascular anatomy. RADIATION DOSE REDUCTION: This exam was performed according to the departmental dose-optimization program which includes automated exposure control, adjustment of the mA and/or kV according to patient size and/or use of iterative reconstruction technique. CONTRAST:  75mL OMNIPAQUE IOHEXOL 350 MG/ML SOLN COMPARISON:  08/04/2023 FINDINGS: Cardiovascular: No filling defects in the pulmonary arteries to suggest pulmonary emboli. Cardiomegaly. Aorta normal caliber. Mediastinum/Nodes: No mediastinal, hilar, or axillary adenopathy. Tracheostomy tube in the midtrachea, unchanged. Thyroid and esophagus unremarkable. Lungs/Pleura: Improving bilateral airspace disease. Small left effusion. Bronchiectasis in the  lower lobes with dependent airspace opacities, left greater than right. Scattered ground-glass opacities throughout the lungs. Upper Abdomen: Perihepatic and perisplenic ascites. Musculoskeletal: Chest wall soft tissues are unremarkable. No acute bony abnormality. Review of the MIP images confirms the above findings. IMPRESSION: No evidence of pulmonary embolus. Cardiomegaly. Improving bilateral airspace disease. Continued ground-glass opacities which could reflect edema and bibasilar airspace opacities, left greater than right, atelectasis versus infiltrates. Small left effusion. Small amount of upper abdominal ascites. Aortic Atherosclerosis (ICD10-I70.0). Electronically Signed   By: Charlett Nose M.D.   On: 08/13/2023 02:35   CT ANGIO GI BLEED Result Date: 08/12/2023 CLINICAL DATA:  GI bleed, shortness of breath EXAM: CTA ABDOMEN AND PELVIS WITHOUT AND WITH CONTRAST TECHNIQUE: Multidetector CT imaging of the abdomen and pelvis was performed using the standard protocol during bolus administration of intravenous contrast. Multiplanar reconstructed images and MIPs were obtained and reviewed to evaluate the vascular anatomy. RADIATION DOSE REDUCTION: This exam was performed according to the departmental dose-optimization program which includes automated exposure control, adjustment of the mA and/or kV according to patient size and/or use of iterative reconstruction technique. CONTRAST:  OMNIPAQUE IOHEXOL 350 MG/ML SOLN COMPARISON:  Partial comparison to CT chest dated 08/14/2023. CT abdomen/pelvis dated 07/02/2023. FINDINGS: Motion degraded images. Lower chest: Small left pleural effusion with associated left lower lobe opacity, atelectasis versus pneumonia. Additional ground-glass opacities in the visualized lungs, mildly improved, favoring mild interstitial edema. Hepatobiliary: Liver is within normal limits. Status post cholecystectomy. No intrahepatic or extrahepatic dilatation. Pancreas: Within normal  limits. Spleen: Within normal limits. Adrenals/Urinary Tract: Adrenal glands are within normal limits. Kidneys are within normal limits.  No hydronephrosis. Bladder is within normal limits. Stomach/Bowel: Percutaneous gastrostomy in satisfactory position. Following contrast administration, there is intraluminal spillage of contrast along the posterior gastric antrum and pylorus (series 7/images 66-70), suggesting the site of active GI bleeding. Secondary inflammatory changes involving the 3rd/4th portion of the duodenum (series 7/image 13), suggesting duodenitis. No evidence of bowel  obstruction. Status post right hemicolectomy with appendectomy. Mild sigmoid diverticulosis, without evidence of diverticulitis. Vascular/Lymphatic: No evidence of abdominal aortic aneurysm. Atherosclerotic calcifications of the abdominal aorta and branch vessels, although vessels remain patent. No suspicious abdominopelvic lymphadenopathy. Reproductive: Prostate is unremarkable. Other: Trace right pelvic ascites. Musculoskeletal: Mild degenerative changes of the lumbar spine. IMPRESSION: Motion degraded images. Suspected active GI bleeding along the posterior gastric antrum/pylorus, as above. Percutaneous gastrostomy in satisfactory position. Secondary inflammatory changes involving the 3rd/4th portion of the duodenum, suggesting duodenitis. Small left pleural effusion with associated left lower lobe opacity, atelectasis versus pneumonia. Additional ground-glass opacities in the visualized lungs, mildly improved, favoring mild interstitial edema. Electronically Signed   By: Charline Bills M.D.   On: 08/12/2023 18:59    Scheduled Meds:  amiodarone  200 mg Per Tube Daily   pantoprazole (PROTONIX) IV  40 mg Intravenous Q12H   sertraline  100 mg Oral Daily   Continuous Infusions:  lactated ringers Stopped (08/14/23 0320)     LOS: 0 days    Time spent:   Debarah Crape, DO [facility]  To contact the  attending physician between 7A-7P please use Epic Chat. To contact the covering physician during after hours 7P-7A, please review Amion.   08/14/2023, 7:25 AM   *This document has been created with the assistance of dictation software. Please excuse typographical errors. *

## 2023-08-14 NOTE — Progress Notes (Addendum)
NAME:  Joshua Eaton, MRN:  [id number], DOB:  June 13, 1956, LOS: 0 ADMISSION DATE:  08/13/2023, CONSULTATION DATE:  12/13 REFERRING MD:  Dr. Eloise Harman, CHIEF COMPLAINT:  GIB   History of Present Illness:  Patient is a 67 yo M w/ pertinent PMH chronic respiratory failure w/ [facility], Afib on eliquis presents to [facility] ED on 12/13 w/ GIB.  Patient recently admitted w/ respiratory failure and pna in October 2024 requiring tracheostomy. He was later [facility]. Later that day had an accidental trach dislodgement. His wob continued to increase requiring re-admission to [facility] on 10/31 and was intubated and later trached during hospital admission. [facility].   On 12/13 patient admitted to [facility] w/ drop in hgb and concern for GIB. GI consulted for possible endoscopy. TRH to admit and PCCM to consult for vent management.  Pertinent  Medical History   Past Medical History:  Diagnosis Date   Anxiety    Cancer (HCC)    Depression    Hypertension    OCD (obsessive compulsive disorder)      Significant Hospital Events: Including procedures, antibiotic start and stop dates in addition to other pertinent events   12/13 admitted w/ GIB. Pccm to consult for vent management  Interim History / Subjective:  Having some arm pain from potassium administration. Family frustrated with lack of progress and issues with sacral wound.  Objective   Blood pressure (!) 112/52, pulse 78, temperature 98.7 F (37.1 C), temperature source Oral, resp. rate 15, height 6\' 1"  (1.854 m), weight 86.6 kg, SpO2 98%.    Vent Mode: PRVC FiO2 (%):  [40 %] 40 % Set Rate:  [20 bmp] 20 bmp Vt Set:  [470 mL] 470 mL PEEP:  [5 cmH20] 5 cmH20 Plateau Pressure:  [15 cmH20-21 cmH20] 18 cmH20   Intake/Output Summary (Last 24 hours) at 08/14/2023 1627 Last data filed at 08/14/2023 0211 Gross per 24 hour  Intake 32.56 ml  Output --   Net 32.56 ml   Filed Weights   08/14/23 0453  Weight: 86.6 kg    Examination: No distress on vent Lungs don't sound bad, triggering vent Abd soft, PEG in place draining bilious fluid Heart sounds regular, ext warm, +murmur Moves to command but weak Deep sacral ulcer noted (see photos) Also a ?fungal lesion on leg (see photos)  Elevated Cr noted BUN is up c/w GIB H/H drifting down with IVF  Resolved Hospital Problem list     Assessment & Plan:  ABLA, duodenitis with probable GIB- getting a unit today Acute kidney injury Chronic respiratory failure- got to 20h TC at [facility], family thinks panic attacks have limited this a bit Trach/PEG dependent 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- not operative candidate until rehabs better Anxiety  LUE provoked DVT from picc line HTN ?fungal rash Unstagable pressure ulcer POA- awaiting WOC input, recent debridement ?bloody secretions- keep an eye on, TXA nebs PRN; LLL infiltrate small noted; if spikes fevers or looks septic we can chase  - f/u LUE duplex - AC on hold, q6h H/H; usual transfusion thresholds - EGD tentatively in AM - Fungal cream trial on L knee rash - PS vs. TC trials once he's settled out - CVL to be placed for better access and blood draws given ongoing severe pain from K replacement - Family does not want to go back to [facility] - Benzos and opiates PRN as ordered - Per  tube amio is fine - TOC wound consult: ?hydrotherapy: does not look like deeper involvement - Gentle D5LR@75 /hr  Consulting services: GI, WOC  Best practice:  Diet: on hold Pain/Anxiety/Delirium protocol (if indicated): N/A VAP protocol (if indicated): In place DVT prophylaxis: SCD GI prophylaxis: PPI Glucose control: N/A Mobility: turn Sacral wound stage 4+ POA Code Status: full  Family Communication: updated family at length Disposition: Remains in ICU due to vent dependence  Myrla Halsted MD PCCM

## 2023-08-14 NOTE — Progress Notes (Signed)
Walked into patient room to find family member taking a video of the patient. Family member informed of policy, where she them requested a copy.    Charge RN informed, [facility] informed.

## 2023-08-14 NOTE — Plan of Care (Signed)
  Problem: Activity: Goal: Ability to tolerate increased activity will improve Outcome: Progressing   Problem: Respiratory: Goal: Ability to maintain a clear airway and adequate ventilation will improve Outcome: Progressing   Problem: Role Relationship: Goal: Method of communication will improve Outcome: Progressing   

## 2023-08-15 ENCOUNTER — Inpatient Hospital Stay (HOSPITAL_COMMUNITY): Payer: Medicare Other | Admitting: Certified Registered Nurse Anesthetist

## 2023-08-15 ENCOUNTER — Encounter (HOSPITAL_COMMUNITY): Admission: EM | Disposition: E | Payer: Self-pay | Source: Home / Self Care | Attending: Internal Medicine

## 2023-08-15 ENCOUNTER — Inpatient Hospital Stay (HOSPITAL_COMMUNITY): Payer: Medicare Other

## 2023-08-15 ENCOUNTER — Encounter (HOSPITAL_COMMUNITY): Payer: Self-pay | Admitting: Family Medicine

## 2023-08-15 DIAGNOSIS — I340 Nonrheumatic mitral (valve) insufficiency: Secondary | ICD-10-CM | POA: Diagnosis not present

## 2023-08-15 DIAGNOSIS — K3189 Other diseases of stomach and duodenum: Secondary | ICD-10-CM | POA: Diagnosis not present

## 2023-08-15 DIAGNOSIS — Z931 Gastrostomy status: Secondary | ICD-10-CM | POA: Diagnosis not present

## 2023-08-15 DIAGNOSIS — K5710 Diverticulosis of small intestine without perforation or abscess without bleeding: Secondary | ICD-10-CM

## 2023-08-15 DIAGNOSIS — K2289 Other specified disease of esophagus: Secondary | ICD-10-CM

## 2023-08-15 DIAGNOSIS — J9611 Chronic respiratory failure with hypoxia: Secondary | ICD-10-CM

## 2023-08-15 DIAGNOSIS — K922 Gastrointestinal hemorrhage, unspecified: Secondary | ICD-10-CM | POA: Diagnosis not present

## 2023-08-15 HISTORY — PX: BIOPSY: SHX5522

## 2023-08-15 HISTORY — PX: ESOPHAGOGASTRODUODENOSCOPY (EGD) WITH PROPOFOL: SHX5813

## 2023-08-15 LAB — COMPREHENSIVE METABOLIC PANEL
ALT: 18 U/L (ref 0–44)
AST: 32 U/L (ref 15–41)
Albumin: 1.8 g/dL — ABNORMAL LOW (ref 3.5–5.0)
Alkaline Phosphatase: 39 U/L (ref 38–126)
Anion gap: 8 (ref 5–15)
BUN: 48 mg/dL — ABNORMAL HIGH (ref 8–23)
CO2: 32 mmol/L (ref 22–32)
Calcium: 8.3 mg/dL — ABNORMAL LOW (ref 8.9–10.3)
Chloride: 103 mmol/L (ref 98–111)
Creatinine, Ser: 1.27 mg/dL — ABNORMAL HIGH (ref 0.61–1.24)
GFR, Estimated: >60 mL/min (ref >60)
Glucose, Bld: 97 mg/dL (ref 70–99)
Potassium: 3.2 mmol/L — ABNORMAL LOW (ref 3.5–5.1)
Sodium: 143 mmol/L (ref 135–145)
Total Bilirubin: 1.5 mg/dL — ABNORMAL HIGH (ref <1.2)
Total Protein: 5 g/dL — ABNORMAL LOW (ref 6.5–8.1)

## 2023-08-15 LAB — CBC WITH DIFFERENTIAL/PLATELET
Abs Immature Granulocytes: 0.09 10*3/uL — ABNORMAL HIGH (ref 0.00–0.07)
Basophils Absolute: 0.1 10*3/uL (ref 0.0–0.1)
Basophils Relative: 1 %
Eosinophils Absolute: 0.4 10*3/uL (ref 0.0–0.5)
Eosinophils Relative: 3 %
HCT: 27.9 % — ABNORMAL LOW (ref 39.0–52.0)
Hemoglobin: 9 g/dL — ABNORMAL LOW (ref 13.0–17.0)
Immature Granulocytes: 1 %
Lymphocytes Relative: 21 %
Lymphs Abs: 2.3 10*3/uL (ref 0.7–4.0)
MCH: 30.4 pg (ref 26.0–34.0)
MCHC: 32.3 g/dL (ref 30.0–36.0)
MCV: 94.3 fL (ref 80.0–100.0)
Monocytes Absolute: 0.9 10*3/uL (ref 0.1–1.0)
Monocytes Relative: 8 %
Neutro Abs: 7.2 10*3/uL (ref 1.7–7.7)
Neutrophils Relative %: 66 %
Platelets: 168 10*3/uL (ref 150–400)
RBC: 2.96 MIL/uL — ABNORMAL LOW (ref 4.22–5.81)
RDW: 18 % — ABNORMAL HIGH (ref 11.5–15.5)
WBC: 10.9 10*3/uL — ABNORMAL HIGH (ref 4.0–10.5)
nRBC: 0 % (ref 0.0–0.2)

## 2023-08-15 LAB — BPAM RBC
Blood Product Expiration Date: 202412282359
ISSUE DATE / TIME: 202412141748
Unit Type and Rh: 5100

## 2023-08-15 LAB — PHOSPHORUS: Phosphorus: 2.8 mg/dL (ref 2.5–4.6)

## 2023-08-15 LAB — TYPE AND SCREEN
ABO/RH(D): O POS
Antibody Screen: NEGATIVE
Unit division: 0

## 2023-08-15 LAB — HEMOGLOBIN
Hemoglobin: 9.6 g/dL — ABNORMAL LOW (ref 13.0–17.0)
Hemoglobin: 8.8 g/dL — ABNORMAL LOW (ref 13.0–17.0)

## 2023-08-15 LAB — H. PYLORI ANTIGEN, STOOL: H. Pylori Stool Ag, Eia: NEGATIVE

## 2023-08-15 LAB — GLUCOSE, CAPILLARY
Glucose-Capillary: 95 mg/dL (ref 70–99)
Glucose-Capillary: 95 mg/dL (ref 70–99)
Glucose-Capillary: 110 mg/dL — ABNORMAL HIGH (ref 70–99)
Glucose-Capillary: 105 mg/dL — ABNORMAL HIGH (ref 70–99)
Glucose-Capillary: 99 mg/dL (ref 70–99)

## 2023-08-15 LAB — MAGNESIUM: Magnesium: 2.9 mg/dL — ABNORMAL HIGH (ref 1.7–2.4)

## 2023-08-15 SURGERY — ESOPHAGOGASTRODUODENOSCOPY (EGD) WITH PROPOFOL
Anesthesia: General

## 2023-08-15 MED ORDER — PROPOFOL 500 MG/50ML IV EMUL
INTRAVENOUS | Status: DC | PRN
  Administered 2023-08-15: 50 ug/kg/min via INTRAVENOUS

## 2023-08-15 MED ORDER — ROCURONIUM BROMIDE 10 MG/ML (PF) SYRINGE
PREFILLED_SYRINGE | INTRAVENOUS | Status: DC | PRN
  Administered 2023-08-15: 30 mg via INTRAVENOUS

## 2023-08-15 MED ORDER — MEDIHONEY WOUND/BURN DRESSING EX PSTE
1.0000 | PASTE | Freq: Every day | CUTANEOUS | Status: DC
  Administered 2023-08-15 – 2023-09-15 (×36): 1 via TOPICAL
  Filled 2023-08-15 (×6): qty 44

## 2023-08-15 MED ORDER — PHENYLEPHRINE HCL-NACL 20-0.9 MG/250ML-% IV SOLN
INTRAVENOUS | Status: DC | PRN
  Administered 2023-08-15: 25 ug/min via INTRAVENOUS

## 2023-08-15 MED ORDER — TRANEXAMIC ACID FOR INHALATION: 500.0000 mg | Freq: Two times a day (BID) | RESPIRATORY_TRACT | Status: DC | PRN

## 2023-08-15 MED ORDER — PROPOFOL 10 MG/ML IV BOLUS
INTRAVENOUS | Status: DC | PRN
  Administered 2023-08-15: 25 mg via INTRAVENOUS
  Administered 2023-08-15: 50 mg via INTRAVENOUS

## 2023-08-15 MED ORDER — MICONAZOLE NITRATE 2 % EX CREA
TOPICAL_CREAM | Freq: Two times a day (BID) | CUTANEOUS | Status: AC
  Administered 2023-08-25 – 2023-08-29 (×3): 1 via TOPICAL
  Filled 2023-08-15: qty 28.4

## 2023-08-15 MED ORDER — POTASSIUM CHLORIDE 10 MEQ/100ML IV SOLN
10.0000 meq | INTRAVENOUS | Status: AC
Start: 2023-08-15 — End: 2023-08-15
  Administered 2023-08-15 (×5): 10 meq via INTRAVENOUS
  Filled 2023-08-15 (×6): qty 100

## 2023-08-15 SURGICAL SUPPLY — 14 items

## 2023-08-15 NOTE — Progress Notes (Signed)
RT at bedside to assist with transfer to ENDO.

## 2023-08-15 NOTE — Interval H&P Note (Signed)
History and Physical Interval Note: Had PRBC transfusion overnight and Hgb responded appropriately. EGD to further evaluate upper GI bleed / CTA findings. Have discussed risks / benefits of the exam and anesthesia with family previously and they understand / want to proceed. Further recommendations pending the results.   08/15/2023 10:35 AM  Joshua Eaton  has presented today for surgery, with the diagnosis of upper gi bleed.  The various methods of treatment have been discussed with the patient and family. After consideration of risks, benefits and other options for treatment, the patient has consented to  Procedure(s): ESOPHAGOGASTRODUODENOSCOPY (EGD) WITH PROPOFOL (N/A) as a surgical intervention.  The patient's history has been reviewed, patient examined, no change in status, stable for surgery.  I have reviewed the patient's chart and labs.  Questions were answered to the patient's satisfaction.     Viviann Spare P Daveyon Kitchings

## 2023-08-15 NOTE — Plan of Care (Signed)
  Problem: Activity: Goal: Ability to tolerate increased activity will improve Outcome: Progressing   Problem: Respiratory: Goal: Ability to maintain a clear airway and adequate ventilation will improve Outcome: Progressing   Problem: Role Relationship: Goal: Method of communication will improve Outcome: Progressing   Problem: Education: Goal: Knowledge of General Education information will improve Description: Including pain rating scale, medication(s)/side effects and non-pharmacologic comfort measures Outcome: Progressing

## 2023-08-15 NOTE — Plan of Care (Signed)
  Problem: Role Relationship: Goal: Method of communication will improve Outcome: Progressing   Problem: Activity: Goal: Risk for activity intolerance will decrease Outcome: Progressing   Problem: Safety: Goal: Ability to remain free from injury will improve Outcome: Progressing

## 2023-08-15 NOTE — Progress Notes (Signed)
eLink Physician-Brief Progress Note Patient Name: Joshua Eaton DOB: [birth year] MRN: [id number]   Date of Service  08/15/2023  HPI/Events of Note  Has a PEG. No TFs ordered.  eICU Interventions  Continue to hold feeds, defer to primary team.     Intervention Category Minor Interventions: Clinical assessment - ordering diagnostic tests  Vernis Eid 08/15/2023, 8:56 PM

## 2023-08-15 NOTE — Op Note (Signed)
[facility] Patient Name: Joshua Eaton Procedure Date : 08/15/2023 MRN: [id number] Attending MD: Willaim Rayas. Adela Lank , MD, [id number] Date of Birth: 1955-10-15 CSN: [id number] Age: 67 Admit Type: Inpatient Procedure:                Upper GI endoscopy Indications:              Recent upper gastrointestinal bleeding - melena,                            prior CTA positive for antral bleeding. PEG in                            place. On PPI. Patient's wife endorsed the patient                            had an EGD a few months ago which showed a problem                            with the outlet of his stomach but no details or                            procedure report available. Providers:                Willaim Rayas. Adela Lank, MD, Eliberto Ivory, RN, Rozetta Nunnery, Technician Referring MD:              Medicines:                Monitored Anesthesia Care Complications:            No immediate complications. Estimated blood loss:                            Minimal. Estimated Blood Loss:     Estimated blood loss was minimal. Procedure:                Pre-Anesthesia Assessment:                           - Prior to the procedure, a History and Physical                            was performed, and patient medications and                            allergies were reviewed. The patient's tolerance of                            previous anesthesia was also reviewed. The risks                            and benefits of the procedure and the sedation  options and risks were discussed with the patient.                            All questions were answered, and informed consent                            was obtained. Prior Anticoagulants: The patient has                            taken no anticoagulant or antiplatelet agents. ASA                            Grade Assessment: IV - A patient with severe                             systemic disease that is a constant threat to life.                            After reviewing the risks and benefits, the patient                            was deemed in satisfactory condition to undergo the                            procedure.                           After obtaining informed consent, the endoscope was                            passed under direct vision. Throughout the                            procedure, the patient's blood pressure, pulse, and                            oxygen saturations were monitored continuously. The                            [id number]-H190 ([id number] Olympus endoscope was introduced                            through the mouth, and advanced to the second part                            of duodenum. The upper GI endoscopy was                            accomplished without difficulty. The patient                            tolerated the procedure well. Scope In: Scope Out: Findings:      Esophagogastric landmarks were identified: the Z-line was found at 40  cm, the gastroesophageal junction was found at 40 cm and the upper       extent of the gastric folds was found at 40 cm from the incisors.      A single vascular bleb was found in the upper middle third of the       esophagus. No stigmata for bleeding.      The exam of the esophagus was otherwise normal.      There was evidence of an intact gastrostomy with a patent G-tube present       in the gastric body. This was characterized by surrounding superficial       erythema but no focal ulceration or erosion, no stigmata for bleeding.       Of note, there was a roughly 1cm thin metal piece attached to a wire,       coming out of the base of the PEG tract. I tugged on it with a biopsy       forceps but it was not able to be removed or moved, firmly adherent to       the area. The PEG was pushed into the lumen slightly to see the       underside of the it and the tract and there was no cause  for bleeding or       pathology there to cause bleeding.      Mucosal changes were found in the gastric antrum. The posterior area of       the antrum in the prepyloric area was fibrotic / fixed and could not see       well in that area behind a fold. The mucosa at the site could not be       pleated back - was fibrotic to palpation with forceps and there was a       depressed / cratered area that was poorly visualized. Could not see the       base of it but suspect ulcer or pathology there is what causing his       bleeding. Multiple attempts were made to visualize the area, cannot       retroflex in the area. Biopsies were taken with a cold forceps from the       periphery of the fibrotic area for histology, rule out dysplasia.      The exam of the stomach was otherwise normal. No heme or old blood.       Pylorus is widely patent.      Diffuse mild inflammation characterized by erythema and friability was       found in the entire duodenum. No heme noted anywhere.      A diverticulum was found in the second portion of the duodenum.      The exam of the duodenum was otherwise normal. Impression:               - Esophagogastric landmarks identified.                           - Benign small vascular bleb found in the esophagus.                           - Normal esophagus otherwise.                           - Intact  gastrostomy with a patent G-tube present                            characterized by surrounding superficial erythema                            but no cause for bleeding.                           - Thin metal piece attached to a wire exiting from                            PEG tract - could not be manipulated / removed with                            forceps as outlined, but I do not think causing the                            patient's bleeding.                           - Mucosal changes in the antrum as outlined - base                            of this area not well  seen due to fibrotic /                            cratered nature of it but suspect likely there is                            an ulcer there that caused bleeding based on                            location and correlation to findings on previous                            CTA. Biopsied.                           - Duodenitis.                           - Duodenal diverticulum.                           Patient has stopped bleeding with IV PPI alone.                            Responded well to PRBC transfusion, BUN is                            downtrending. Would like to get results of prior  EGD in [place] from a few months ago, wife                            endorses had a gastric outlet obstruction at the                            time. Suspect he probably has / had an ulcer there.                            Again area of concern is poorly visualized. No mass                            seen on CT. Biopsies taken. I think PEG findings                            are incidental and not related to his bleeding.                            Superficial erythema only, although metal piece                            attached to PEG site is atypical. Recommendation:           - Return patient to ICU for ongoing care.                           - Okay to resume tube feeds - he has stopped                            bleeding, no gastric outlet obstruction                           - Continue present medications.                           - Continue IV protonix 40mg  BID and continue upon                            discharge from the hospital                           - Await pathology results.                           - Patient can follow up with [facility]                            for consideration for repeat EGD as outpatient                            pending his course                           - Obtain prior reports from EGD at  [facility]                             hospital to correlate with this endoscopy, may help                            clarify findings                           - I will discuss results with the patient's family                           - Call with questions, GI will sign off for now. Procedure Code(s):        --- Professional ---                           [phone], Esophagogastroduodenoscopy, flexible,                            transoral; with biopsy, single or multiple Diagnosis Code(s):        --- Professional ---                           K22.89, Other specified disease of esophagus                           Z93.1, Gastrostomy status                           K31.89, Other diseases of stomach and duodenum                           K29.80, Duodenitis without bleeding                           K57.10, Diverticulosis of small intestine without                            perforation or abscess without bleeding CPT copyright 2022 American Medical Association. All rights reserved. The codes documented in this report are preliminary and upon coder review may  be revised to meet current compliance requirements. Viviann Spare P. Chauntae Hults, MD 08/15/2023 11:33:33 AM This report has been signed electronically. Number of Addenda: 0

## 2023-08-15 NOTE — Transfer of Care (Signed)
Immediate Anesthesia Transfer of Care Note  Patient: Joshua Eaton  Procedure(s) Performed: ESOPHAGOGASTRODUODENOSCOPY (EGD) WITH PROPOFOL BIOPSY  Patient Location: ICU  Anesthesia Type:General  Level of Consciousness: drowsy  Airway & Oxygen Therapy: Patient remains intubated per anesthesia plan and through tracheostomy  Post-op Assessment: Report given to RN and Post -op Vital signs reviewed and stable  Post vital signs: Reviewed and stable  Last Vitals:  Vitals Value Taken Time  BP 126/59 08/15/23 1200  Temp 36.5 C 08/15/23 1145  Pulse 75 08/15/23 1205  Resp 18 08/15/23 1205  SpO2 100 % 08/15/23 1205  Vitals shown include unfiled device data.  Last Pain:  Vitals:   08/15/23 1200  TempSrc:   PainSc: 0-No pain         Complications: No notable events documented.

## 2023-08-15 NOTE — Progress Notes (Signed)
RT note. Sputum sample collected and sent to lab.

## 2023-08-15 NOTE — Anesthesia Preprocedure Evaluation (Signed)
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 on medications + Valvular Problems/Murmurs  Rhythm:Regular Rate:Normal  06/2023 Echo IMPRESSIONS     1. Images were technically challenging due to patient movement and  movement/impedence from the NG tube.   2. Left ventricular ejection fraction, by estimation, is 60 to 65%. The  left ventricle has normal function. The left ventricle has no regional  wall motion abnormalities.   3. Right ventricular systolic function is normal. The right ventricular  size is normal.   4. No left atrial/left atrial appendage thrombus was detected.   5. Eccentric mitral regurgitation with Coanda effect. There is no  pulmonary vein systolic reversal. There is at least moderate mitral  regurgitation, likely severe. The anterior mitral valve leaflet appears to  be perforated. The mitral valve is normal in   structure. No evidence of mitral valve regurgitation. No evidence of  mitral stenosis. There is severe prolapse of the middle segment of the  anterior leaflet of the mitral valve.   6. The aortic valve is tricuspid. Aortic valve regurgitation is moderate  to severe. No aortic stenosis is present. Aortic regurgitation PHT  measures 411 msec.   7. The inferior vena cava is normal in size with greater than 50%  respiratory variability, suggesting right atrial pressure of 3 mmHg.     Neuro/Psych negative neurological ROS     GI/Hepatic Neg liver ROS,,,GI bleed   Endo/Other  negative endocrine ROS    Renal/GU Renal InsufficiencyRenal disease      Musculoskeletal   Abdominal   Peds  Hematology  (+) Blood dyscrasia, anemia   Anesthesia Other Findings   Reproductive/Obstetrics                             Anesthesia Physical Anesthesia Plan  ASA: 4  Anesthesia Plan: General   Post-op Pain Management: Minimal or no pain anticipated   Induction: Intravenous  PONV Risk Score and Plan: 1  Airway Management Planned: Tracheostomy  Additional Equipment: None  Intra-op Plan:   Post-operative Plan: Post-operative intubation/ventilation  Informed Consent: I have reviewed the patients History and Physical, chart, labs and discussed the procedure including the risks, benefits and alternatives for the proposed anesthesia with the patient or authorized representative who has indicated his/her understanding and acceptance.       Plan Discussed with:   Anesthesia Plan Comments:        Anesthesia Quick Evaluation

## 2023-08-15 NOTE — Anesthesia Postprocedure Evaluation (Signed)
Anesthesia Post Note  Patient: Saliou Mcilvain  Procedure(s) Performed: ESOPHAGOGASTRODUODENOSCOPY (EGD) WITH PROPOFOL BIOPSY     Patient location during evaluation: PACU Anesthesia Type: General Level of consciousness: awake and alert Pain management: pain level controlled Vital Signs Assessment: post-procedure vital signs reviewed and stable Respiratory status: spontaneous breathing, nonlabored ventilation, respiratory function stable and patient connected to nasal cannula oxygen Cardiovascular status: blood pressure returned to baseline and stable Postop Assessment: no apparent nausea or vomiting Anesthetic complications: no  No notable events documented.  Last Vitals:  Vitals:   08/15/23 1200 08/15/23 1300  BP: (!) 126/59   Pulse: 73 70  Resp: 20 19  Temp:    SpO2: 98% 99%    Last Pain:  Vitals:   08/15/23 1300  TempSrc:   PainSc: Asleep                 Kennieth Rad

## 2023-08-15 NOTE — Progress Notes (Signed)
NAME:  Joshua Eaton, MRN:  [id number], DOB:  [birth year], LOS: 1 ADMISSION DATE:  08/13/2023, CONSULTATION DATE:  12/13 REFERRING MD:  Dr. Eloise Harman, CHIEF COMPLAINT:  GIB   History of Present Illness:  Patient is a 67 yo M w/ pertinent PMH chronic respiratory failure w/ tracheostomy/vent dependent from [facility], Afib on eliquis presents to [facility] ED on 12/13 w/ GIB.  Patient recently admitted w/ respiratory failure and pna in October 2024 requiring tracheostomy. He was later transferred to [facility] on 10/28. Later that day had an accidental trach dislodgement. His wob continued to increase requiring re-admission to [facility] on 10/31 and was intubated and later trached during hospital admission. Patient discharged on 11/12 [facility].   On 12/13 patient admitted to [facility] w/ drop in hgb and concern for GIB. GI consulted for possible endoscopy. TRH to admit and PCCM to consult for vent management.  Pertinent  Medical History   Past Medical History:  Diagnosis Date   Anxiety    Cancer (HCC)    Depression    Hypertension    OCD (obsessive compulsive disorder)    Significant Hospital Events: Including procedures, antibiotic start and stop dates in addition to other pertinent events   12/13 admitted w/ GIB. Pccm to consult for vent management  Interim History / Subjective:  No complaints per pt, denies SOB/ nausea/ abd pain.  No further melena.  H/H stable.  Intermittent ongoing bloody trach secretions.  Going for EGD this am.    Objective   Blood pressure (!) 110/55, pulse 73, temperature 98.3 F (36.8 C), temperature source Oral, resp. rate (!) 21, height 6\' 1"  (1.854 m), weight 86.6 kg, SpO2 96%.    Vent Mode: PRVC FiO2 (%):  [40 %] 40 % Set Rate:  [20 bmp] 20 bmp Vt Set:  [470 mL] 470 mL PEEP:  [5 cmH20] 5 cmH20 Plateau Pressure:  [18 cmH20-22 cmH20] 21 cmH20   Intake/Output Summary (Last 24 hours) at 08/15/2023 0947 Last data filed at 08/15/2023 4098 Gross per 24  hour  Intake 2921.74 ml  Output 950 ml  Net 1971.74 ml   Filed Weights   08/14/23 0453  Weight: 86.6 kg    Examination: General:  chronically ill appearing older male sitting upright in bed in NAD HEENT: MM pink/moist, pupil R 4, L3 (baseline)/ R, midline XLT trach site wnl Neuro: Awake, mouths words, follows commands/ MAE weakly  CV: rr, NSR, + murmur, R internal jugular CVL PULM:  non labored on MV, FiO2 40, scant BR tracheal secretions, coarse  GI: soft, bs+, NT, PEG clamped Extremities: warm/dry, no LE edema  Skin: ? Fungal rash, circular to left knee, posterior wounds not assessed   H/H stable BUN/ sCr downtrending K 3.2 UOP stable, net +1.4L  Resolved Hospital Problem list     Assessment & Plan:  ABLA 2/2 UGIB/ duodenitis - CTA showed active bleeding from gastric antrum/ pylorus with duodenitis - s/p 1u PRBC 12/14 P:  - Appreciate [facility] recs, plans for EGD today - NPO - PPI BID - H/H stable, BUN down trending.  Trending CBC, transfuse for Hgb > 7 - cont holding eliquis  Acute kidney injury - improving BUN/ sCr.  Stable UOP - continue to support hemodynamically and monitor renal function closely   Bloody secretions- ?secondary to suctioning trauma vs aspiration from GIB Chronic respiratory failure- got to 20h TC at [facility], family thinks panic attacks have limited this  Trach/PEG dependent Questionable bibasilar atelectasis vs  infiltrate P:  - TXA nebs prn, H/H stable - limit suctioning, secretions stable - remains afebrile, WBC down trending, stable vent needs off abx. CXR worse today ?aspiration pneumonitis w/recent emesis/ GIB.  Will send trach asp.   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  Resume po amio when cleared by GI, optimize electrolytes  Anxiety  - ativan prn   LUE provoked DVT from picc line - Eliquis on hold with GIB  HTN - stable BP.  Not on antihypertensives pta  ?fungal rash Unstagable pressure ulcer POA-  Mod PCM Severe deconditioning  - WOC consulted.   recent debridement - resume TF when able - PT/ OT  Best practice:  Diet: NPO Pain/Anxiety/Delirium protocol (if indicated): N/A VAP protocol (if indicated): In place DVT prophylaxis: SCD GI prophylaxis: PPI Glucose control: N/A- CBGs in 90s Mobility: turn Sacral wound stage 4+ POA Code Status: full  Family Communication: wife updated at bedside  Disposition: Remains in ICU due to vent dependence     Posey Boyer, MSN, AG-ACNP-BC [facility] 08/15/2023, 9:47 AM  See Amion for pager If no response to pager , please call [phone] until 7pm After 7:00 pm call Elink  [phone]

## 2023-08-15 NOTE — Progress Notes (Signed)
EGD consent in chart.

## 2023-08-15 NOTE — Consult Note (Signed)
WOC Nurse Consult Note: Reason for Consult:Stage 4 pressure injury , fungal rash to left anterior leg near knee.   Wound type: pressure/ fungal Pressure Injury POA: Yes recent debridement Measurement: 5 cm x 4.2 cm x 0.5 cm  Left knee:  5 cm round rash, consistent with fungal overgrowth Wound bed:25% fibrin to wound bed, bone palpable Drainage (amount, consistency, odor) moderate serosanguinous  no odor Periwound: Erythema and warmth Dressing procedure/placement/frequency: Cleanse sacral wound with NS and pat dry  Apply medihoney to wound bed twice daily..  Cover with NS moist gauze and secure with sacral foam.   Apply miconazole cream twice daily to left knee rash.  Will not follow at this time.  Please re-consult if needed.  Mike Gip MSN, RN, FNP-BC CWON Wound, Ostomy, Continence Nurse [facility] [phone] Pager [phone]

## 2023-08-16 ENCOUNTER — Inpatient Hospital Stay (HOSPITAL_COMMUNITY): Payer: Medicare Other

## 2023-08-16 DIAGNOSIS — R042 Hemoptysis: Secondary | ICD-10-CM | POA: Diagnosis not present

## 2023-08-16 DIAGNOSIS — K922 Gastrointestinal hemorrhage, unspecified: Secondary | ICD-10-CM | POA: Diagnosis not present

## 2023-08-16 LAB — COMPREHENSIVE METABOLIC PANEL
ALT: 17 U/L (ref 0–44)
AST: 27 U/L (ref 15–41)
Albumin: 1.9 g/dL — ABNORMAL LOW (ref 3.5–5.0)
Alkaline Phosphatase: 34 U/L — ABNORMAL LOW (ref 38–126)
Anion gap: 4 — ABNORMAL LOW (ref 5–15)
BUN: 31 mg/dL — ABNORMAL HIGH (ref 8–23)
CO2: 29 mmol/L (ref 22–32)
Calcium: 8 mg/dL — ABNORMAL LOW (ref 8.9–10.3)
Chloride: 107 mmol/L (ref 98–111)
Creatinine, Ser: 1.12 mg/dL (ref 0.61–1.24)
GFR, Estimated: >60 mL/min (ref >60)
Glucose, Bld: 88 mg/dL (ref 70–99)
Potassium: 3.4 mmol/L — ABNORMAL LOW (ref 3.5–5.1)
Sodium: 140 mmol/L (ref 135–145)
Total Bilirubin: 1.4 mg/dL — ABNORMAL HIGH (ref <1.2)
Total Protein: 5 g/dL — ABNORMAL LOW (ref 6.5–8.1)

## 2023-08-16 LAB — CULTURE, RESPIRATORY W GRAM STAIN

## 2023-08-16 LAB — CBC WITH DIFFERENTIAL/PLATELET
Abs Immature Granulocytes: 0.07 10*3/uL (ref 0.00–0.07)
Basophils Absolute: 0 10*3/uL (ref 0.0–0.1)
Basophils Relative: 0 %
Eosinophils Absolute: 0.4 10*3/uL (ref 0.0–0.5)
Eosinophils Relative: 5 %
HCT: 27.3 % — ABNORMAL LOW (ref 39.0–52.0)
Hemoglobin: 8.6 g/dL — ABNORMAL LOW (ref 13.0–17.0)
Immature Granulocytes: 1 %
Lymphocytes Relative: 19 %
Lymphs Abs: 1.8 10*3/uL (ref 0.7–4.0)
MCH: 30.3 pg (ref 26.0–34.0)
MCHC: 31.5 g/dL (ref 30.0–36.0)
MCV: 96.1 fL (ref 80.0–100.0)
Monocytes Absolute: 0.8 10*3/uL (ref 0.1–1.0)
Monocytes Relative: 8 %
Neutro Abs: 6.3 10*3/uL (ref 1.7–7.7)
Neutrophils Relative %: 67 %
Platelets: 143 10*3/uL — ABNORMAL LOW (ref 150–400)
RBC: 2.84 MIL/uL — ABNORMAL LOW (ref 4.22–5.81)
RDW: 17.4 % — ABNORMAL HIGH (ref 11.5–15.5)
WBC: 9.4 10*3/uL (ref 4.0–10.5)
nRBC: 0 % (ref 0.0–0.2)

## 2023-08-16 LAB — HEMOGLOBIN AND HEMATOCRIT, BLOOD
HCT: 31.7 % — ABNORMAL LOW (ref 39.0–52.0)
Hemoglobin: 9.9 g/dL — ABNORMAL LOW (ref 13.0–17.0)

## 2023-08-16 LAB — RESPIRATORY PANEL BY PCR

## 2023-08-16 LAB — GLUCOSE, CAPILLARY
Glucose-Capillary: 83 mg/dL (ref 70–99)
Glucose-Capillary: 72 mg/dL (ref 70–99)
Glucose-Capillary: 81 mg/dL (ref 70–99)
Glucose-Capillary: 88 mg/dL (ref 70–99)
Glucose-Capillary: 100 mg/dL — ABNORMAL HIGH (ref 70–99)
Glucose-Capillary: 98 mg/dL (ref 70–99)
Glucose-Capillary: 109 mg/dL — ABNORMAL HIGH (ref 70–99)

## 2023-08-16 LAB — HIV ANTIBODY (ROUTINE TESTING W REFLEX): HIV Screen 4th Generation wRfx: NONREACTIVE

## 2023-08-16 LAB — MAGNESIUM: Magnesium: 2.6 mg/dL — ABNORMAL HIGH (ref 1.7–2.4)

## 2023-08-16 LAB — PHOSPHORUS: Phosphorus: 3.2 mg/dL (ref 2.5–4.6)

## 2023-08-16 MED ORDER — FENTANYL CITRATE PF 50 MCG/ML IJ SOSY
100.0000 ug | PREFILLED_SYRINGE | Freq: Once | INTRAMUSCULAR | Status: AC
  Administered 2023-08-16: 100 ug via INTRAVENOUS

## 2023-08-16 MED ORDER — ETOMIDATE 2 MG/ML IV SOLN
INTRAVENOUS | Status: AC
  Administered 2023-08-16: 20 mg via INTRAVENOUS
  Filled 2023-08-16: qty 10

## 2023-08-16 MED ORDER — FREE WATER
150.0000 mL | Status: DC
Start: 2023-08-16 — End: 2023-09-04
  Administered 2023-08-16 – 2023-09-04 (×114): 150 mL

## 2023-08-16 MED ORDER — ACETAMINOPHEN 325 MG PO TABS
650.0000 mg | ORAL_TABLET | Freq: Four times a day (QID) | ORAL | Status: DC | PRN
  Administered 2023-08-16 – 2023-08-25 (×5): 650 mg
  Filled 2023-08-16 (×5): qty 2

## 2023-08-16 MED ORDER — TRANEXAMIC ACID FOR INHALATION
500.0000 mg | Freq: Three times a day (TID) | RESPIRATORY_TRACT | Status: AC
  Administered 2023-08-16 – 2023-08-17 (×6): 500 mg via RESPIRATORY_TRACT
  Filled 2023-08-16 (×6): qty 10

## 2023-08-16 MED ORDER — DOCUSATE SODIUM 50 MG/5ML PO LIQD
100.0000 mg | Freq: Two times a day (BID) | ORAL | Status: DC
  Administered 2023-08-17 – 2023-08-19 (×4): 100 mg
  Filled 2023-08-16 (×4): qty 10

## 2023-08-16 MED ORDER — FUROSEMIDE 10 MG/ML IJ SOLN
40.0000 mg | Freq: Once | INTRAMUSCULAR | Status: AC
  Administered 2023-08-17: 40 mg via INTRAVENOUS
  Filled 2023-08-16: qty 4

## 2023-08-16 MED ORDER — FUROSEMIDE 10 MG/ML IJ SOLN
40.0000 mg | Freq: Once | INTRAMUSCULAR | Status: AC
  Administered 2023-08-16: 40 mg via INTRAVENOUS
  Filled 2023-08-16: qty 4

## 2023-08-16 MED ORDER — PHENOL 1.4 % MT LIQD
1.0000 | OROMUCOSAL | Status: DC | PRN
  Administered 2023-08-16: 1 via OROMUCOSAL
  Filled 2023-08-16: qty 177

## 2023-08-16 MED ORDER — FUROSEMIDE 10 MG/ML IJ SOLN
20.0000 mg | Freq: Every day | INTRAMUSCULAR | Status: DC
  Administered 2023-08-17: 20 mg via INTRAVENOUS
  Filled 2023-08-16: qty 2

## 2023-08-16 MED ORDER — ZOLPIDEM TARTRATE 5 MG PO TABS
5.0000 mg | ORAL_TABLET | Freq: Every evening | ORAL | Status: DC | PRN
  Administered 2023-08-17 – 2023-08-27 (×6): 5 mg
  Filled 2023-08-16 (×7): qty 1

## 2023-08-16 MED ORDER — SERTRALINE HCL 100 MG PO TABS
100.0000 mg | ORAL_TABLET | Freq: Every day | ORAL | Status: DC
  Administered 2023-08-16 – 2023-09-07 (×23): 100 mg
  Filled 2023-08-16 (×23): qty 1

## 2023-08-16 MED ORDER — ALBUMIN HUMAN 25 % IV SOLN
25.0000 g | Freq: Four times a day (QID) | INTRAVENOUS | Status: AC
  Administered 2023-08-17 (×4): 25 g via INTRAVENOUS
  Filled 2023-08-16 (×4): qty 100

## 2023-08-16 MED ORDER — POLYETHYLENE GLYCOL 3350 17 G PO PACK: 17.0000 g | PACK | Freq: Every day | ORAL | Status: DC

## 2023-08-16 MED ORDER — OSMOLITE 1.5 CAL PO LIQD
1000.0000 mL | ORAL | Status: DC
Start: 2023-08-16 — End: 2023-08-31
  Administered 2023-08-16 – 2023-08-31 (×18): 1000 mL
  Filled 2023-08-16 (×12): qty 1000

## 2023-08-16 MED ORDER — FENTANYL CITRATE PF 50 MCG/ML IJ SOSY
PREFILLED_SYRINGE | INTRAMUSCULAR | Status: AC
  Filled 2023-08-16: qty 2

## 2023-08-16 MED ORDER — ETOMIDATE 2 MG/ML IV SOLN: 20.0000 mg | Freq: Once | INTRAVENOUS | Status: AC

## 2023-08-16 MED ORDER — PROPOFOL 1000 MG/100ML IV EMUL
0.0000 ug/kg/min | INTRAVENOUS | Status: DC
  Administered 2023-08-17: 5 ug/kg/min via INTRAVENOUS
  Filled 2023-08-16: qty 100

## 2023-08-16 MED ORDER — POTASSIUM CHLORIDE 20 MEQ PO PACK
40.0000 meq | PACK | Freq: Once | ORAL | Status: AC
  Administered 2023-08-16: 40 meq
  Filled 2023-08-16: qty 2

## 2023-08-16 NOTE — Progress Notes (Signed)
[facility] ADULT ICU REPLACEMENT PROTOCOL   The patient does apply for the [facility] Adult ICU Electrolyte Replacment Protocol based on the criteria listed below:   1.Exclusion criteria: TCTS, ECMO, Dialysis, and Myasthenia Gravis patients 2. Is GFR >/= 30 ml/min? Yes.    Patient's GFR today is >60 3. Is SCr </= 2? Yes.   Patient's SCr is 1.12 mg/dL 4. Did SCr increase >/= 0.5 in 24 hours? No. 5.Pt's weight >40kg  Yes.   6. Abnormal electrolyte(s): K  7. Electrolytes replaced per protocol 8.  Call MD STAT for K+ </= 2.5, Phos </= 1, or Mag </= 1 Physician:  Thersa Salt Tameika Heckmann 08/16/2023 5:44 AM

## 2023-08-16 NOTE — Progress Notes (Signed)
NAME:  Joshua Eaton, MRN:  [id number], DOB:  1956-02-26, LOS: 2 ADMISSION DATE:  08/13/2023, CONSULTATION DATE:  12/13 REFERRING MD:  Dr. Eloise Harman, CHIEF COMPLAINT:  GIB   History of Present Illness:  Patient is a 67 yo M w/ pertinent PMH chronic respiratory failure w/ tracheostomy/[facility], Afib on eliquis presents to [facility] ED on 12/13 w/ GIB.  Patient recently admitted w/ respiratory failure and pna in October 2024 requiring tracheostomy. He was later transferred to [facility] on 10/28. Later that day had an accidental trach dislodgement. His wob continued to increase requiring re-admission to [facility] on 10/31 and was intubated and later trached during hospital admission. Patient discharged on 11/12 [facility].   On 12/13 patient admitted to [facility] w/ drop in hgb and concern for GIB. GI consulted for possible endoscopy. TRH to admit and PCCM to consult for vent management.  Pertinent  Medical History   Past Medical History:  Diagnosis Date   Anxiety    Cancer (HCC)    Depression    Hypertension    OCD (obsessive compulsive disorder)    Significant Hospital Events: Including procedures, antibiotic start and stop dates in addition to other pertinent events   12/13 admitted w/ GIB. Pccm to consult for vent management 12/14 1 unit pRBC 12/15 EGD - erosion around PEG site but no active bleeding; cratered ulcer biopsied near peg site.   Interim History / Subjective:  This morning he denies complaints. Has not been off the vent yet during the day.   Objective   Blood pressure (!) 122/56, pulse 78, temperature 98 F (36.7 C), temperature source Axillary, resp. rate 20, height 6\' 1"  (1.854 m), weight 86.6 kg, SpO2 97%.    Vent Mode: PRVC FiO2 (%):  [40 %] 40 % Set Rate:  [20 bmp] 20 bmp Vt Set:  [470 mL] 470 mL PEEP:  [5 cmH20] 5 cmH20 Plateau Pressure:  [12 cmH20-23 cmH20] 22 cmH20   Intake/Output Summary (Last 24 hours) at 08/16/2023 0726 Last  data filed at 08/16/2023 0651 Gross per 24 hour  Intake 1154.43 ml  Output 1150 ml  Net 4.43 ml   Filed Weights   08/14/23 0453  Weight: 86.6 kg    Examination: General:  chronically ill appearing man on the vent, lying in bed in NAD HEENT: [place]/AT, eyes anicteric Neuro: awake, mouthing words to communicate, moving extremities but weak CV: S1S2, RRR PULM:  breathing comfortably on MV, blood tinged secretions in tracheal suctioning catheter. No wheezing or rhonchi. GI: soft, NT. No erythema or bleeding around PEG site Extremities: warm, dry. Annular rash on knee with leading edge and central clearing Skin: petechiael rash on dorsum of feet, palmar surface of hands.   K+ 3.4 BUN 31 Cr 1.12 T bili 1.4 H/H 8.6/27.3 Platelets 143 CXR personally reviewed> pulm edema, atelectasis in bases  Resolved Hospital Problem list     Assessment & Plan:  ABLA 2/2 UGIB/ duodenitis, ongoing slow trend down in Hb- CTA showed active bleeding from gastric antrum/ pylorus with duodenitis. Multiple transfusions upstairs, 1 unit since admission. -follow up gastric biopsy; appreciate GI ([facility] management) -PPI BID -monitor H/H, transfuse for Hb <7 or hemodynamically significant bleeding -con't holding eliquis; worry about clotting risk  Elevated d-dimer despite treatment for DVT with [facility] for the past few months -monitor -have to continue holding [facility] unfortunately  Acute kidney injury, improved -strict I/O -renally dose meds, avoid nephrotoxic meds -monitor  Hypokalemia -repleted, monitor  Bloody tracheal secretions- secondary to suctioning trauma most likely Chronic respiratory failure- got to 20h TC at [facility], family thinks panic attacks have limited vent weaning. 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 - not operative candidate until significant rehabilitation Paroxysmal Afib -oral amiodarone -lasix -hold eliquis  Anxiety  -ativan PRN; seems to be controlled this morning while he is on the vent -con't sertraline -off seroquel right now; if anxiety or agitation are increasing can resume -con't ambien at bedtime  LUE provoked DVT from picc line -Eliquis on hold unfortunately due to bleeding  HTN; not an issue -stable off meds  Rash on knee-- looks like ringworm -topical antifungal -contact precautions  Petechial rash on hands and feet -ID consult  Unstagable pressure ulcer on sacrum POA -wound care, turns -optimize nutrition as able  Moderate protein energy malnutrition  -resume TF today  Severe deconditioning  - appreciate wound care's management -PT, OT consulted -optimize nutrition  Wife had mentioned she cannot get records from [place] because she is not POA-- she would like this paperwork to be done. Palliative care consulted for assistance with getting POA completed.   Wife updated at bedside.   Best practice:  Diet: TF Pain/Anxiety/Delirium protocol (if indicated): N/A VAP protocol (if indicated): In place DVT prophylaxis: SCD GI prophylaxis: PPI Glucose control: N/A- CBGs in 90s Mobility: turn, progress as tolerated Sacral wound stage 4+ POA Code Status: full  Family Communication: wife updated at bedside 12/16 Disposition: Remains in ICU due to vent dependence   This patient is critically ill with multiple organ system failure which requires frequent high complexity decision making, assessment, support, evaluation, and titration of therapies. This was completed through the application of advanced monitoring technologies and extensive interpretation of  multiple databases. During this encounter critical care time was devoted to patient care services described in this note for 36 minutes.  Steffanie Dunn, DO 08/16/23 8:17 AM [facility]  For contact information, see Amion. If no response to pager, please call PCCM consult pager. After hours, 7PM- 7AM, please call Elink.

## 2023-08-16 NOTE — TOC CM/SW Note (Signed)
Transition of Care [facility]) - Inpatient Brief Assessment   Patient Details  Name: Joshua Eaton MRN: [id number] Date of Birth: 10/05/1955  Transition of Care [facility] [place] [facility]) CM/SW Contact:    Tom-Johnson, Hershal Coria, RN Phone Number: 08/16/2023, 3:56 PM   Clinical Narrative:  Patient presented to the ED from [facility] for concerns of GI bleed. Patient is Trach/Vent dependent.  Wife, [name] requesting patient not return to [facility]. States she is aware that there is only one more LTAC locally and she does not want patient far from home. Requested to speak with Clinical Liaison at [facility]. CM called in referral to [facility] and he spoke with [name], plan to tour facility tomorrow.   Patient not Medically ready for discharge.  CM will continue to follow as patient progresses with care towards discharge.     Transition of Care Asessment: Insurance and Status: Insurance coverage has been reviewed Patient has primary care physician: Yes Home environment has been reviewed: Yes Prior level of function:: Trach/Vent dependent Prior/Current Home Services: No current home services (From [facility]) Social Drivers of Health Review: SDOH reviewed no interventions necessary Readmission risk has been reviewed: Yes Transition of care needs: transition of care needs identified, TOC will continue to follow

## 2023-08-16 NOTE — Progress Notes (Addendum)
eLink Physician-Brief Progress Note Patient Name: Joshua Eaton DOB: [birth year] MRN: [id number]   Date of Service  08/16/2023  HPI/Events of Note  67-year-old male with a history of subacute respiratory failure that required tracheostomy placement.  Downsize trach from 8.0->6.0 and had a bout of sudden respiratory distress this evening.  Required replacement of the inner cannula due to plugging.  With saline lavage, the patient had thick bloody secretions that were aspirated.  Continues to have tachypnea  eICU Interventions  Received his as needed Ativan.  Added 1 dose of fentanyl 100 mcg with significant improvement.  Requested bedside evaluation for potential bronchoscopy.   4098 -initiated tube feeds today.  Adding SSI every 4 hours just in case.  Last A1c 4.3.  If normoglycemic over the next 24 hours, can discontinue insulin and CBG checks.  Intervention Category Major Interventions: Respiratory failure - evaluation and management  Vallerie Hentz 08/16/2023, 11:36 PM

## 2023-08-16 NOTE — Consult Note (Signed)
[facility]    Date of Admission:  08/13/2023     Reason for Consult: rash     Referring Provider: Karie Fetch     Lines:  Right internal jugular central line 12/14-c  Abx: None this admission        Assessment: hx dvt on [facility] that was recently stopped due to tracheal site bleeding, chronic tracheostomy and peg, pAfib, hx right hemicolectomy for colon cancer, recent off and on abx for pna and been back and forth at [facility] as well, admitted 12/13 for gib. Id consulted for rash   From id standpoint he has had various antibiotics recently within the past few weeks prior to this admission. The clinical appearance/hx most suggestive of a drug/abx related rash that could be improving  Of the rash distribution and exposure hx, it is unlikely tick related. Atypical for a viral process such as hands/foot mouth, syphilis, reactive arthritis, embolic process from endocarditis. And for someone looking this well, I supposed acquired complement deficiency with menincoccal septicemia could appear like this - although would expect fever   Labs doesn't suggest dic/post-transfusion associated purpura (discussed with pulm/ccm dr Chestine Spore)  ?other small vessel vasculitis ?what causes  Plan: For now, I have sent viral pcr swab, urine gc/chlamydia, hiv screen, syphilis, blood cultures If all negative (no abx given this week), would call this most likely drug reaction from recent abx that is getting better If this is worse would consider biopsy, echo, and per history other w/u as needed Discussed with primary team      ------------------------------------------------ Principal Problem:   Acute GI bleeding Active Problems:   Severe mitral regurgitation   AKI (acute kidney injury) (HCC)   On mechanically assisted ventilation (HCC)   Acute respiratory failure with hypoxia (HCC)   Tracheostomy in place (HCC)   PEG (percutaneous endoscopic  gastrostomy) status (HCC)   Sacral ulcer (HCC)   Paroxysmal atrial fibrillation (HCC)   DVT (deep venous thrombosis) (HCC)   HTN (hypertension)   Rash   Upper GI bleed   Abnormal finding on GI tract imaging   Acute post-hemorrhagic anemia    HPI: Brown Boros is a 67 y.o. male hx dvt on DoAC that was recently stopped due to tracheal site bleeding, chronic tracheostomy and peg, pAfib, hx right hemicolectomy for colon cancer, recent off and on abx for pna and been back and forth at [facility] as well, admitted 12/13 for gib. Id consulted for rash  Hx via chart/patient's wife  Patient had an episode of vomiting just prior to this admission and seen in ed where there was concern for gib with acute on chronic HB drop (cta a/p with suspected gib along gastric antrum/pylorus), and recent ?tracheal bleeding when DOAC was stopped. He is also on baby aspirin which was hold this admission  Ct does show scattered bilateral pulm infiltrate and serial xray here had shown stability, in setting recent past 2-6 weeks treatment for pneumonia  He came in here with a left thigh annular rash and 1-2 week of palms/soles rash. He has had transfusion  He denies headache, feeling ill, joint pain, current n/v/diarrhea, abd pain, chest pain  His trach setting is minimal  He has had no fever; hds; there is no leukocytosis  He is monogamously married and hasn't had any outdoor/sick contact the last several weeks/few months and has had no travel  He is not currently on abx here  The wife said the rash is stable        History reviewed. No pertinent family history.  Social History   Tobacco Use   Smoking status: Never    Passive exposure: Never   Smokeless tobacco: Never  Vaping Use   Vaping status: Never Used  Substance Use Topics   Alcohol use: No   Drug use: No    Allergies  Allergen Reactions   Codeine     "sends me into orbit"    Review of Systems: ROS All Other ROS was  negative, except mentioned above   Past Medical History:  Diagnosis Date   Anxiety    Cancer (HCC)    Depression    Hypertension    OCD (obsessive compulsive disorder)        Scheduled Meds:  sodium chloride   Intravenous Once   amiodarone  200 mg Per Tube Daily   Chlorhexidine Gluconate Cloth  6 each Topical Daily   [START ON 08/17/2023] furosemide  20 mg Intravenous Daily   leptospermum manuka honey  1 Application Topical Daily   miconazole   Topical BID   mupirocin ointment  1 Application Nasal BID   mouth rinse  15 mL Mouth Rinse Q2H   pantoprazole (PROTONIX) IV  40 mg Intravenous Q12H   sertraline  100 mg Per Tube Daily   tranexamic acid  500 mg Nebulization Q8H   Continuous Infusions:  feeding supplement (OSMOLITE 1.5 CAL) 1,000 mL (08/16/23 1201)   promethazine (PHENERGAN) injection (IM or IVPB)     PRN Meds:.acetaminophen, ipratropium-albuterol, LORazepam, ondansetron (ZOFRAN) IV, mouth rinse, oxyCODONE, promethazine (PHENERGAN) injection (IM or IVPB), zolpidem   OBJECTIVE: Blood pressure 124/61, pulse 96, temperature 98.1 F (36.7 C), temperature source Oral, resp. rate 19, height 6\' 1"  (1.854 m), weight 86.6 kg, SpO2 96%.  Physical Exam  General/constitutional: no distress, pleasant, interactive; s/p trach; chronically ill appearing; minimal vent setting HEENT: Normocephalic, PER, Conj Clear; oropharynx clear Neck supple CV: rrr no mrg Lungs: clear to auscultation, normal respiratory effort; trach site no purulence/erythema Abd: Soft, Nontender Ext: no edema Skin: 2 types of rash; 1 is on the anterior distal left femur annular with slightly squamous/heaped up edge and scaling in the center; the other rash is somewhat symmetric dorsal/periarticular subtalar and bilateral palms/soles petechiae nontender rash Neuro: nonfocal MSK: no peripheral joint swelling/tenderness/warmth  Right internal jugular cvc central line site no erythema/purulence  Lab  Results Lab Results  Component Value Date   WBC 9.4 08/16/2023   HGB 8.6 (L) 08/16/2023   HCT 27.3 (L) 08/16/2023   MCV 96.1 08/16/2023   PLT 143 (L) 08/16/2023    Lab Results  Component Value Date   CREATININE 1.12 08/16/2023   BUN 31 (H) 08/16/2023   NA 140 08/16/2023   K 3.4 (L) 08/16/2023   CL 107 08/16/2023   CO2 29 08/16/2023    Lab Results  Component Value Date   ALT 17 08/16/2023   AST 27 08/16/2023   ALKPHOS 34 (L) 08/16/2023   BILITOT 1.4 (H) 08/16/2023      Microbiology: Recent Results (from the past 240 hours)  Culture, Respiratory w Gram Stain     Status: None   Collection Time: 08/12/23 10:32 AM   Specimen: Tracheal Aspirate; Respiratory  Result Value Ref Range Status   Specimen Description TRACHEAL ASPIRATE  Final   Special Requests NONE  Final   Gram Stain   Final    ABUNDANT WBC PRESENT, PREDOMINANTLY PMN RARE  GRAM POSITIVE RODS RARE GRAM POSITIVE COCCI IN PAIRS    Culture   Final    MODERATE METHICILLIN RESISTANT STAPHYLOCOCCUS AUREUS MODERATE CORYNEBACTERIUM STRIATUM Standardized susceptibility testing for this organism is not available. Performed at [facility], [address] [place], [place] [address]    Report Status 08/16/2023 FINAL  Final   Organism ID, Bacteria METHICILLIN RESISTANT STAPHYLOCOCCUS AUREUS  Final      Susceptibility   Methicillin resistant staphylococcus aureus - MIC*    CIPROFLOXACIN >=8 RESISTANT Resistant     ERYTHROMYCIN >=8 RESISTANT Resistant     GENTAMICIN <=0.5 SENSITIVE Sensitive     OXACILLIN >=4 RESISTANT Resistant     TETRACYCLINE <=1 SENSITIVE Sensitive     VANCOMYCIN 1 SENSITIVE Sensitive     TRIMETH/SULFA <=10 SENSITIVE Sensitive     CLINDAMYCIN <=0.25 SENSITIVE Sensitive     RIFAMPIN <=0.5 SENSITIVE Sensitive     Inducible Clindamycin NEGATIVE Sensitive     LINEZOLID 2 SENSITIVE Sensitive     * MODERATE METHICILLIN RESISTANT STAPHYLOCOCCUS AUREUS  MRSA Next Gen by PCR, Nasal     Status:  Abnormal   Collection Time: 08/14/23  9:48 AM   Specimen: Nasal Mucosa; Nasal Swab  Result Value Ref Range Status   MRSA by PCR Next Gen DETECTED (A) NOT DETECTED Final    Comment: RESULT CALLED TO, READ BACK BY AND VERIFIED WITH: RN Rubin Payor on 121424 @1329H  by SM (NOTE) The GeneXpert MRSA Assay (FDA approved for NASAL specimens only), is one component of a comprehensive MRSA colonization surveillance program. It is not intended to diagnose MRSA infection nor to guide or monitor treatment for MRSA infections. Test performance is not FDA approved in patients less than 27 years old. Performed at [facility], [address], [place], [place] [address]   Culture, Respiratory w Gram Stain     Status: None (Preliminary result)   Collection Time: 08/15/23 10:02 AM   Specimen: Tracheal Aspirate; Respiratory  Result Value Ref Range Status   Specimen Description TRACHEAL ASPIRATE  Final   Special Requests NONE  Final   Gram Stain   Final    RARE SQUAMOUS EPITHELIAL CELLS PRESENT FEW WBC PRESENT,BOTH PMN AND MONONUCLEAR RARE GRAM POSITIVE COCCI    Culture   Final    CULTURE REINCUBATED FOR BETTER GROWTH Performed at [facility], [address], [place], [place] [address]    Report Status PENDING  Incomplete     Serology:    Imaging: If present, new imagings (plain films, ct scans, and mri) have been personally visualized and interpreted; radiology reports have been reviewed. Decision making incorporated into the Impression / Recommendations.  08/16/23 cxr FINDINGS: Tracheostomy tube is present. Right jugular central line with the tip in the SVC region. Diffuse lung densities are similar to the previous examination. Possible small pleural effusions. Heart size is within normal limits and stable. Negative for a pneumothorax.   IMPRESSION: 1. Diffuse lung densities are not significantly changed. 2. Possible small pleural effusions.   12/13 chest  cta IMPRESSION: No evidence of pulmonary embolus.   Cardiomegaly.   Improving bilateral airspace disease. Continued ground-glass opacities which could reflect edema and bibasilar airspace opacities, left greater than right, atelectasis versus infiltrates.   Small left effusion.   Small amount of upper abdominal ascites.       Raymondo Band, MD [facility] [phone] pager    08/16/2023, 1:58 PM

## 2023-08-16 NOTE — Progress Notes (Signed)
RT called to bedside because patient was desating. RT placed patient back on vent for sats and WOB. Patient states he feels better and treatment was given.

## 2023-08-16 NOTE — Evaluation (Signed)
Physical Therapy Evaluation Patient Details Name: Joshua Eaton MRN: [id number] DOB: 1956/03/29 Today's Date: 08/16/2023  History of Present Illness  67 y.o. male presents to [facility] hospital on 08/13/23 from [facility] with GI bleed. PMHx: colon CA s/p Rt hemicolectomy, MVR, COPD, PAF, LUE DVT, respiratory failure with trach, severe MR, PEG  Clinical Impression   Pt admitted secondary to problem above with deficits below. PTA patient was residing in [facility] [employer]). Per RN, it was reported that he was sitting up in a chair all day. (Unsure transfer status/assist; +significant sacral wound). Pt currently requires total assist/lift for OOB activity as he fatigued quickly on trach collar with minimal bed-level activity. He likely will tolerate activity better if resting on ventilator. Appears motivated and without contractures. Anticipate patient will benefit from PT to address problems listed below.Will continue to follow acutely to maximize functional mobility independence and safety.           If plan is discharge home, recommend the following:     Can travel by private vehicle        Equipment Recommendations None recommended by PT  Recommendations for Other Services       Functional Status Assessment  (unknown; suspect decline)     Precautions / Restrictions Precautions Precautions: Fall      Mobility  Bed Mobility Overal bed mobility: Needs Assistance Bed Mobility: Rolling Rolling: Mod assist, Used rails         General bed mobility comments: scoot up to [facility] +2 total; pt weaning on trach collar with incr RR and decr sats with AAROM and deferred attempt at EOB; chair position did not reduce his symptoms; RN and RT with pt on PT departure    Transfers                        Ambulation/Gait                  Stairs            Wheelchair Mobility     Tilt Bed    Modified Rankin (Stroke Patients Only)       Balance                                              Pertinent Vitals/Pain Pain Assessment Pain Assessment: Faces Faces Pain Scale: No hurt    Home Living Family/patient expects to be discharged to:: Other (Comment)                   Additional Comments: From [facility]    Prior Function Prior Level of Function : Patient poor historian/Family not available             Mobility Comments: per medical records, has been mostly bedbound since Oct 2024 hospitalization       Extremity/Trunk Assessment   Upper Extremity Assessment Upper Extremity Assessment: Defer to OT evaluation (did follow commands for biceps and triceps strength bil)    Lower Extremity Assessment Lower Extremity Assessment: Generalized weakness (AAROM WFL; grossly 3-/5 hips)    Cervical / Trunk Assessment Cervical / Trunk Assessment: Other exceptions Cervical / Trunk Exceptions: sacral wound  Communication   Communication Communication: Tracheostomy (pt speaking around trach somewhat) Cueing Techniques: Verbal cues;Gestural cues  Cognition Arousal: Alert Behavior During Therapy: Restless Overall Cognitive Status: No family/caregiver  present to determine baseline cognitive functioning                                 General Comments: not oriented to which hospital or year; +month        General Comments General comments (skin integrity, edema, etc.): On trach collar with RR 23-38 and sats as low as 82% with activity.    Exercises     Assessment/Plan    PT Assessment Patient needs continued PT services  PT Problem List Decreased strength;Decreased range of motion;Decreased activity tolerance;Decreased balance;Decreased mobility;Decreased coordination;Decreased cognition;Decreased knowledge of use of DME;Decreased safety awareness;Decreased knowledge of precautions;Cardiopulmonary status limiting activity;Decreased skin integrity;Pain       PT Treatment Interventions DME  instruction;Gait training;Functional mobility training;Therapeutic activities;Therapeutic exercise;Balance training;Neuromuscular re-education;Cognitive remediation;Patient/family education;Wheelchair mobility training    PT Goals (Current goals can be found in the Care Plan section)  Acute Rehab PT Goals Patient Stated Goal: n/a PT Goal Formulation: Patient unable to participate in goal setting Time For Goal Achievement: 08/30/23 Potential to Achieve Goals: Fair    Frequency Min 1X/week     Co-evaluation               AM-PAC PT "6 Clicks" Mobility  Outcome Measure Help needed turning from your back to your side while in a flat bed without using bedrails?: A Lot Help needed moving from lying on your back to sitting on the side of a flat bed without using bedrails?: Total Help needed moving to and from a bed to a chair (including a wheelchair)?: Total Help needed standing up from a chair using your arms (e.g., wheelchair or bedside chair)?: Total Help needed to walk in hospital room?: Total Help needed climbing 3-5 steps with a railing? : Total 6 Click Score: 7    End of Session Equipment Utilized During Treatment: Oxygen Activity Tolerance: Patient limited by fatigue;Treatment limited secondary to medical complications (Comment) (incr RR and decr sats) Patient left: in bed;with call bell/phone within reach;with nursing/sitter in room;Other (comment) (RT)   PT Visit Diagnosis: Muscle weakness (generalized) (M62.81)    Time: 1308-6578 PT Time Calculation (min) (ACUTE ONLY): 23 min   Charges:   PT Evaluation $PT Eval Low Complexity: 1 Low   PT General Charges $$ ACUTE PT VISIT: 1 Visit          [name], PT Acute Rehabilitation Services  Office [phone]   [name] 08/16/2023, 2:45 PM

## 2023-08-16 NOTE — Procedures (Signed)
Ongoing bleeding now with intermittent trach occlusion. With wife's permission did bronch; small clots removed from around distal end of trach. Tracheostomy tip abuts membranous posterior wall May do better with a bivona adjustable length. Will increase PEEP and do heavier sedation tonight to give TXA nebs some time to work. Levo ordered along with prop Giving additional albumin/lasix Wife updated  Myrla Halsted MD PCCM

## 2023-08-16 NOTE — Progress Notes (Signed)
Physical Therapy Wound Evaluation and Treatment Patient Details  Name: Joshua Eaton MRN: [id number] Date of Birth: 02-27-1956  Today's Date: 08/16/2023 Time: 1000-1037 Time Calculation (min): 37 min  Subjective  Subjective Assessment Subjective: Trach on vent; nods yes/no Patient and Family Stated Goals: wife present and wants it to heal without need for further surgery Date of Onset:  (unknown) Prior Treatments: per wife, plastics did debridement last week; NS wet to dry dressings  Pain Score:   Denied pain (shaking head no) when asked  Wound Assessment  Pressure Injury 08/14/23 Sacrum Posterior;Medial Unstageable - Full thickness tissue loss in which the base of the injury is covered by slough (yellow, tan, gray, green or brown) and/or eschar (tan, brown or black) in the wound bed. (Active)  Wound Image   08/16/23 1124  Dressing Type Barrier Film (skin prep);Honey;Gauze (Comment);Foam - Lift dressing to assess site every shift 08/16/23 1124  Dressing Clean, Dry, Intact;Changed 08/16/23 1124  Dressing Change Frequency Daily 08/16/23 1124  State of Healing Early/partial granulation 08/16/23 1124  Site / Wound Assessment Bleeding;Granulation tissue;Red;Yellow 08/16/23 1124  % Wound base Red or Granulating 70% 08/16/23 1124  % Wound base Yellow/Fibrinous Exudate 30% 08/16/23 1124  % Wound base Black/Eschar 0% 08/16/23 1124  % Wound base Other/Granulation Tissue (Comment) 0% 08/16/23 1124  Peri-wound Assessment Pink 08/16/23 1124  Wound Length (cm) 4.7 cm 08/16/23 1124  Wound Width (cm) 4.2 cm 08/16/23 1124  Wound Depth (cm) 3.1 cm 08/16/23 1124  Wound Surface Area (cm^2) 19.74 cm^2 08/16/23 1124  Wound Volume (cm^3) 61.19 cm^3 08/16/23 1124  Margins Unattached edges (unapproximated) 08/16/23 1124  Drainage Amount Copious 08/16/23 1124  Drainage Description Serosanguineous 08/16/23 1124  Treatment Debridement (Selective);Irrigation 08/16/23 1124   Selective Debridement  (non-excisional) Selective Debridement (non-excisional) - Location: sacrum Selective Debridement (non-excisional) - Tools Used: Forceps, Scalpel Selective Debridement (non-excisional) - Tissue Removed: yellow slough    Wound Assessment and Plan  Wound Therapy - Assess/Plan/Recommendations Wound Therapy - Clinical Statement: Patient referred to hydrotherapy due to sacral wound with yellow slough present (30%). Debridement performed with additional adherent yellow tissue underlying removed tissue. Attempts to further debride resulted in bleeding. Patient will benefit from short course of hydrotherapy to remove necrotic tissue to promote healing. Wound Therapy - Functional Problem List: weak with limited mobility Factors Delaying/Impairing Wound Healing: Incontinence, Immobility, Multiple medical problems Hydrotherapy Plan: Debridement, Dressing change, Other (comment) (irrigation) Wound Therapy - Frequency: 2X / week (Tues, Friday) Wound Therapy - Follow Up Recommendations: dressing changes by RN  Wound Therapy Goals- Improve the function of patient's integumentary system by progressing the wound(s) through the phases of wound healing (inflammation - proliferation - remodeling) by: Wound Therapy Goals - Improve the function of patient's integumentary system by progressing the wound(s) through the phases of wound healing by: Decrease Necrotic Tissue to: 15% Decrease Necrotic Tissue - Progress: Goal set today Increase Granulation Tissue to: 85% Increase Granulation Tissue - Progress: Goal set today Decrease Length/Width/Depth by (cm): --/--/0.3 Decrease Length/Width/Depth - Progress: Goal set today Improve Drainage Characteristics: Min Improve Drainage Characteristics - Progress: Goal set today Goals/treatment plan/discharge plan were made with and agreed upon by patient/family: Yes Time For Goal Achievement: 7 days Wound Therapy - Potential for Goals: Good  Goals will be updated until  maximal potential achieved or discharge criteria met.  Discharge criteria: when goals achieved, discharge from hospital, MD decision/surgical intervention, no progress towards goals, refusal/missing three consecutive treatments without notification or medical reason.  GP  Charges PT Wound Care Charges $Wound Debridement up to 20 cm: < or equal to 20 cm $PT Hydrotherapy Visit: 1 Visit      [name], PT Acute Rehabilitation Services  Office [phone]   [name] 08/16/2023, 11:33 AM

## 2023-08-16 NOTE — Progress Notes (Signed)
Patient placed on 35% ATC. Patient tolerating well. Vitals are stable. Orders placed by CCM to be placed back on vent at night. RN made aware.

## 2023-08-16 NOTE — Progress Notes (Signed)
Nutrition Follow-up  DOCUMENTATION CODES:   Non-severe (moderate) malnutrition in context of chronic illness  INTERVENTION:   Tube Feeding via PEG:  Osmolite 1.5 at 60 ml/hr with Pro-Source TF20 60 mL BID When initiated, recommend starting at 20 ml/hr, titrating by 10 mL q 8 hours until goal of 60 ml/hr TF at goal rate provides 2320 kcals, 130 g of protein and 1094 mL of free water   Recommend additional free water flush of 200 mL q 4 hours to meet hydration needs: free water flush provides 1200 mL of free water  Plan to add Juven BID once TF at goal, each packet provides 80 calories, 8 grams of carbohydrate, 2.5  grams of protein (collagen), 7 grams of L-arginine and 7 grams of L-glutamine; supplement contains CaHMB, Vitamins C, E, B12 and Zinc to promote wound healing   NUTRITION DIAGNOSIS:   Moderate Malnutrition related to chronic illness as evidenced by severe muscle depletion, moderate muscle depletion, mild fat depletion.  Being addressed via TF  GOAL:   Patient will meet greater than or equal to 90% of their needs  Progressing  MONITOR:   Vent status, TF tolerance, Skin  REASON FOR ASSESSMENT:   Consult Enteral/tube feeding initiation and management  ASSESSMENT:   67 yo male admitted with possible GI Bleed from [facility]; pt with hx of chronic respiratory failure requiring chronic trach/vent. Noted pt with prolonged hospital stay on October for pneumonia, severe MR, pulmonary edema requiring trach/PEG placement. PMH includes HTN, depression/amxiety, OCD  12/15 EGD: erosion around PEG site but no active bleeding; cratered ulcer near PEG site biopsied  Current weight 86.6 kg (admit wt), no weight today  Noted TF ordered by MD this AM based on RD previous recommendations; not yet infusing.   Labs: BUN 31 (H), Creatinine 1.12 (wdl), potassium 3.4 (L), phosphorus 3.2 (wdl) Meds: lasix  NUTRITION - FOCUSED PHYSICAL EXAM:  Flowsheet Row Most Recent Value   Orbital Region Mild depletion  Upper Arm Region Mild depletion  Thoracic and Lumbar Region Mild depletion  Buccal Region Mild depletion  Temple Region Severe depletion  Clavicle Bone Region Severe depletion  Clavicle and Acromion Bone Region Moderate depletion  Scapular Bone Region Moderate depletion  Dorsal Hand Unable to assess  Patellar Region Severe depletion  Anterior Thigh Region Severe depletion  Posterior Calf Region Severe depletion  Edema (RD Assessment) Mild       Diet Order:   Diet Order             Diet NPO time specified  Diet effective now                   EDUCATION NEEDS:   Not appropriate for education at this time  Skin:  Skin Integrity Issues:: Unstageable Unstageable: sacrum  Last BM:  12/14  Height:   Ht Readings from Last 1 Encounters:  08/14/23 6\' 1"  (1.854 m)    Weight:   Wt Readings from Last 1 Encounters:  08/14/23 86.6 kg   BMI:  Body mass index is 25.19 kg/m.  Estimated Nutritional Needs:   Kcal:  2200-2400 kcals  Protein:  125-150 g  Fluid:  >/= 2L   Romelle Starcher MS, RDN, LDN, CNSC Registered Dietitian 3 Clinical Nutrition RD Inpatient Contact Info in Amion

## 2023-08-17 ENCOUNTER — Inpatient Hospital Stay (HOSPITAL_COMMUNITY): Payer: Medicare Other

## 2023-08-17 ENCOUNTER — Encounter (HOSPITAL_COMMUNITY): Payer: Self-pay | Admitting: Gastroenterology

## 2023-08-17 DIAGNOSIS — Z930 Tracheostomy status: Secondary | ICD-10-CM | POA: Diagnosis not present

## 2023-08-17 DIAGNOSIS — I351 Nonrheumatic aortic (valve) insufficiency: Secondary | ICD-10-CM

## 2023-08-17 DIAGNOSIS — Z7189 Other specified counseling: Secondary | ICD-10-CM

## 2023-08-17 DIAGNOSIS — Z515 Encounter for palliative care: Secondary | ICD-10-CM

## 2023-08-17 DIAGNOSIS — I340 Nonrheumatic mitral (valve) insufficiency: Secondary | ICD-10-CM | POA: Diagnosis not present

## 2023-08-17 DIAGNOSIS — K922 Gastrointestinal hemorrhage, unspecified: Secondary | ICD-10-CM | POA: Diagnosis not present

## 2023-08-17 LAB — SURGICAL PATHOLOGY

## 2023-08-17 LAB — GLUCOSE, CAPILLARY
Glucose-Capillary: 95 mg/dL (ref 70–99)
Glucose-Capillary: 110 mg/dL — ABNORMAL HIGH (ref 70–99)
Glucose-Capillary: 125 mg/dL — ABNORMAL HIGH (ref 70–99)
Glucose-Capillary: 111 mg/dL — ABNORMAL HIGH (ref 70–99)
Glucose-Capillary: 137 mg/dL — ABNORMAL HIGH (ref 70–99)
Glucose-Capillary: 90 mg/dL (ref 70–99)

## 2023-08-17 LAB — COMPREHENSIVE METABOLIC PANEL
ALT: 15 U/L (ref 0–44)
AST: 27 U/L (ref 15–41)
Albumin: 2.5 g/dL — ABNORMAL LOW (ref 3.5–5.0)
Alkaline Phosphatase: 39 U/L (ref 38–126)
Anion gap: 5 (ref 5–15)
BUN: 25 mg/dL — ABNORMAL HIGH (ref 8–23)
CO2: 30 mmol/L (ref 22–32)
Calcium: 8.3 mg/dL — ABNORMAL LOW (ref 8.9–10.3)
Chloride: 107 mmol/L (ref 98–111)
Creatinine, Ser: 1.02 mg/dL (ref 0.61–1.24)
GFR, Estimated: >60 mL/min (ref >60)
Glucose, Bld: 107 mg/dL — ABNORMAL HIGH (ref 70–99)
Potassium: 3.8 mmol/L (ref 3.5–5.1)
Sodium: 142 mmol/L (ref 135–145)
Total Bilirubin: 1.7 mg/dL — ABNORMAL HIGH (ref <1.2)
Total Protein: 5.5 g/dL — ABNORMAL LOW (ref 6.5–8.1)

## 2023-08-17 LAB — CBC WITH DIFFERENTIAL/PLATELET
Abs Immature Granulocytes: 0.12 10*3/uL — ABNORMAL HIGH (ref 0.00–0.07)
Basophils Absolute: 0.1 10*3/uL (ref 0.0–0.1)
Basophils Relative: 1 %
Eosinophils Absolute: 0.4 10*3/uL (ref 0.0–0.5)
Eosinophils Relative: 3 %
HCT: 26.5 % — ABNORMAL LOW (ref 39.0–52.0)
Hemoglobin: 8.3 g/dL — ABNORMAL LOW (ref 13.0–17.0)
Immature Granulocytes: 1 %
Lymphocytes Relative: 9 %
Lymphs Abs: 1.2 10*3/uL (ref 0.7–4.0)
MCH: 30.5 pg (ref 26.0–34.0)
MCHC: 31.3 g/dL (ref 30.0–36.0)
MCV: 97.4 fL (ref 80.0–100.0)
Monocytes Absolute: 0.8 10*3/uL (ref 0.1–1.0)
Monocytes Relative: 6 %
Neutro Abs: 10.7 10*3/uL — ABNORMAL HIGH (ref 1.7–7.7)
Neutrophils Relative %: 80 %
Platelets: 150 10*3/uL (ref 150–400)
RBC: 2.72 MIL/uL — ABNORMAL LOW (ref 4.22–5.81)
RDW: 17.2 % — ABNORMAL HIGH (ref 11.5–15.5)
WBC: 13.3 10*3/uL — ABNORMAL HIGH (ref 4.0–10.5)
nRBC: 0 % (ref 0.0–0.2)

## 2023-08-17 LAB — HEMOGLOBIN AND HEMATOCRIT, BLOOD
HCT: 25.4 % — ABNORMAL LOW (ref 39.0–52.0)
Hemoglobin: 7.9 g/dL — ABNORMAL LOW (ref 13.0–17.0)

## 2023-08-17 LAB — PHOSPHORUS: Phosphorus: 3.6 mg/dL (ref 2.5–4.6)

## 2023-08-17 LAB — MAGNESIUM: Magnesium: 2.3 mg/dL (ref 1.7–2.4)

## 2023-08-17 LAB — RPR: RPR Ser Ql: NONREACTIVE

## 2023-08-17 LAB — TRIGLYCERIDES: Triglycerides: 87 mg/dL (ref <150)

## 2023-08-17 MED ORDER — QUETIAPINE FUMARATE 50 MG PO TABS
50.0000 mg | ORAL_TABLET | Freq: Three times a day (TID) | ORAL | Status: DC
  Administered 2023-08-17 – 2023-09-07 (×65): 50 mg
  Filled 2023-08-17 (×65): qty 1

## 2023-08-17 MED ORDER — GABAPENTIN 250 MG/5ML PO SOLN
100.0000 mg | Freq: Three times a day (TID) | ORAL | Status: DC
  Administered 2023-08-17 – 2023-08-31 (×43): 100 mg
  Filled 2023-08-17 (×46): qty 2

## 2023-08-17 MED ORDER — INSULIN ASPART 100 UNIT/ML IJ SOLN
0.0000 [IU] | INTRAMUSCULAR | Status: DC
  Administered 2023-08-17 – 2023-08-21 (×13): 2 [IU] via SUBCUTANEOUS
  Administered 2023-08-21: 3 [IU] via SUBCUTANEOUS
  Administered 2023-08-22 – 2023-08-24 (×8): 2 [IU] via SUBCUTANEOUS
  Administered 2023-08-25 (×2): 3 [IU] via SUBCUTANEOUS
  Administered 2023-08-25 – 2023-08-26 (×6): 2 [IU] via SUBCUTANEOUS
  Administered 2023-08-26: 3 [IU] via SUBCUTANEOUS
  Administered 2023-08-27 – 2023-08-28 (×6): 2 [IU] via SUBCUTANEOUS
  Administered 2023-08-28: 3 [IU] via SUBCUTANEOUS
  Administered 2023-08-28 – 2023-08-30 (×7): 2 [IU] via SUBCUTANEOUS
  Administered 2023-08-30: 3 [IU] via SUBCUTANEOUS
  Administered 2023-08-31: 2 [IU] via SUBCUTANEOUS
  Administered 2023-08-31: 3 [IU] via SUBCUTANEOUS
  Administered 2023-08-31: 2 [IU] via SUBCUTANEOUS
  Administered 2023-08-31: 3 [IU] via SUBCUTANEOUS
  Administered 2023-09-01 – 2023-09-03 (×9): 2 [IU] via SUBCUTANEOUS
  Administered 2023-09-04: 3 [IU] via SUBCUTANEOUS
  Administered 2023-09-05: 5 [IU] via SUBCUTANEOUS
  Administered 2023-09-06 – 2023-09-15 (×23): 2 [IU] via SUBCUTANEOUS

## 2023-08-17 MED ORDER — POTASSIUM CHLORIDE 20 MEQ PO PACK
20.0000 meq | PACK | Freq: Once | ORAL | Status: AC
  Administered 2023-08-17: 20 meq
  Filled 2023-08-17: qty 1

## 2023-08-17 NOTE — Progress Notes (Signed)
NAME:  Joshua Eaton, MRN:  [id number], DOB:  11-04-1955, LOS: 3 ADMISSION DATE:  08/13/2023, CONSULTATION DATE:  12/13 REFERRING MD:  Dr. Eloise Harman, CHIEF COMPLAINT:  GIB   History of Present Illness:  Patient is a 67 yo M w/ pertinent PMH chronic respiratory failure w/ tracheostomy/[facility], Afib on eliquis presents to [facility] ED on 12/13 w/ GIB.  Patient recently admitted w/ respiratory failure and pna in October 2024 requiring tracheostomy. He was later transferred to [facility] on 10/28. Later that day had an accidental trach dislodgement. His wob continued to increase requiring re-admission to [facility] on 10/31 and was intubated and later trached during hospital admission. Patient discharged on 11/[facility] to [facility].   On 12/13 patient admitted to [facility] w/ drop in hgb and concern for GIB. GI consulted for possible endoscopy. TRH to admit and PCCM to consult for vent management.  Pertinent  Medical History   Past Medical History:  Diagnosis Date   Anxiety    Cancer (HCC)    Depression    Hypertension    OCD (obsessive compulsive disorder)    Significant Hospital Events: Including procedures, antibiotic start and stop dates in addition to other pertinent events   12/13 admitted w/ GIB. Pccm to consult for vent management 12/14 1 unit pRBC 12/15 EGD - erosion around PEG site but no active bleeding; cratered ulcer biopsied near peg site.  12/16 plugging with clots- posterior wall bleeding from distal end of trach  Interim History / Subjective:  Bronch overnight due to plugging from trach. Sedated to suppress coughing.   Objective   Blood pressure (!) 99/51, pulse 68, temperature 97.8 F (36.6 C), temperature source Axillary, resp. rate (!) 22, height 6' 0.99" (1.854 m), weight 86.6 kg, SpO2 99%.    Vent Mode: PRVC FiO2 (%):  [35 %-60 %] 40 % Set Rate:  [20 bmp] 20 bmp Vt Set:  [470 mL] 470 mL PEEP:  [5 cmH20-10 cmH20] 10 cmH20 Pressure Support:  [10  cmH20] 10 cmH20   Intake/Output Summary (Last 24 hours) at 08/17/2023 0706 Last data filed at 08/17/2023 0600 Gross per 24 hour  Intake 876.07 ml  Output 1880 ml  Net -1003.93 ml   Filed Weights   08/14/23 0453  Weight: 86.6 kg    Examination: General:  chronically ill appearing man lying in bed in NAD HEENT: [place]/AT, eyes anicteric Neuro: RASS -4, less coughing, breathing over the vent.  CV: S1S2, RRR. Apical and LSB murmurs PULM:  darker bloody output from tracheostomy suctioning, no rhonchi or wheezing on exam. GI: soft, NT Extremities: no edema, no cyanosis. Skin: rash on left knee- similar to previous exam, similar appearance on R knee as well. Palmar rash and dorsum of feet similar-petechial.  K+ 3.8 BUN 25 Cr 1.02 T bili 1.7 H/H 8.3/26.5 Platelets 150 RPR negative, HIV negative Blood culture 12/16: pending  Resolved Hospital Problem list   Hypokalemia  Assessment & Plan:  ABLA 2/2 UGIB/ duodenitis, ongoing slow trend down in Hb- CTA showed active bleeding from gastric antrum/ pylorus with duodenitis. Multiple transfusions upstairs, 1 unit since admission. -gastric biopsy pending; appreciate GI ([place]'s management) -con't PPI BID -serial CBCs; transfuse for Hb <7 or hemodynamically significant bleeding -con't holding eliquis  Elevated d-dimer despite treatment for DVT with [facility] for the past few months -monitor; has high risk of clotting -unfortunately still need to hold [facility] with bleeding  Acute kidney injury, resolved -strict I/O -renally dose meds, avoid nephrotoxic  meds -monitor  Hemoptysis- secondary to irritation of his trach on the posterior tracheal wall Chronic respiratory failure- got to 20h TC at [facility], family thinks panic attacks have limited vent weaning. Has been deemed chronic vent dependent  Trach & PEG dependent Questionable bibasilar atelectasis vs infiltrate Acute pulmonary edema P:  - TXA nebs -added chloraseptic yesterday, adding  gabapentin today to suppress coughing -will ask RT to order bivona trach -Try weaning sedation today, discussed with nurse - Low threshold to culture if secretions become purulent. 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  LUE provoked DVT from picc line -Eliquis is on hold unfortunately due to bleeding  HTN; not an issue -Has been stable off meds  Rash on knee-- looks like ringworm -Continue contact precautions and topical antifungal  Petechial rash on hands and feet -ID consulted, appreciate their management.  So far workup has been nonrevealing.  Blood cultures are pending  Unstagable pressure ulcer on sacrum POA -Wound care frequent turns - Optimize nutrition  Moderate protein energy malnutrition  -Continue tube feeds  Severe deconditioning  -Appreciate wound care's management - PT, OT to help facilitate mobility - Frequent turns - Optimize nutrition  Wife updated bedside last night.  Asleep this morning during rounds.  Will update her later in the day.  Best practice:  Diet: TF Pain/Anxiety/Delirium protocol (if indicated): N/A VAP protocol (if indicated): In place DVT prophylaxis: SCD GI prophylaxis: PPI Glucose control: N/A- CBGs in 90s Mobility: turn, progress as tolerated Sacral wound stage 4+ POA Code Status: full  Family Communication: wife updated at bedside 12/16 Disposition: Remains in ICU due to vent dependence   This patient is critically ill with multiple organ system  failure which requires frequent high complexity decision making, assessment, support, evaluation, and titration of therapies. This was completed through the application of advanced monitoring technologies and extensive interpretation of multiple databases. During this encounter critical care time was devoted to patient care services described in this note for 34 minutes.  Steffanie Dunn, DO 08/17/23 7:46 AM [facility]  For contact information, see Amion. If no response to pager, please call PCCM consult pager. After hours, 7PM- 7AM, please call Elink.

## 2023-08-17 NOTE — Progress Notes (Signed)
[facility]  Date of Admission:  08/13/2023     Lines:  Right internal jugular central line 12/14-c   Abx: None this admission                                                      Assessment: hx dvt on [facility] that was recently stopped due to tracheal site bleeding, chronic tracheostomy and peg, pAfib, hx right hemicolectomy for colon cancer, recent off and on abx for pna and been back and forth at [facility] as well, admitted 12/13 for gib. Id consulted for rash     From id standpoint he has had various antibiotics recently within the past few weeks prior to this admission. The clinical appearance/hx most suggestive of a drug/abx related rash that could be improving   Of the rash distribution and exposure hx, it is unlikely tick related. Atypical for a viral process such as hands/foot mouth, syphilis, reactive arthritis, embolic process from endocarditis. And for someone looking this well, I supposed acquired complement deficiency with menincoccal septicemia could appear like this - although would expect fever    Labs doesn't suggest dic/post-transfusion associated purpura (discussed with pulm/ccm dr Chestine Spore)   ?other small vessel vasculitis ?what causes   ------------- 08/17/23 id assessment Rash for most part stable. Maybe slight improvement in palms/sole and stable at the dorsum of bilateral ankle joint area Platelet, renal function, rbc/wbc, lft's stable  Hiv, syphilis screen, respiratory viral pcr negative; urine gc/chlam in progress; blood cx ngtd  Other thing that continues to remain on ddx include in order of what I think --> recent abx side effect related rash, vasculitis, infection associated (mycoplasma, handfoot mouth, meningococcal with complement deficiency, reactive arthritis, syphilis but all so far outside of meningococcal had turned out negative)  Consider HSP iga vasculitis spectrum   Plan: F/u bcx and urine gc/chlam If  rash don't improve (or worsen) by end of this week or early next week would consider skin biopsy look for vasculitis Discussed with dr Chestine Spore of pulm ccm   Principal Problem:   Acute GI bleeding Active Problems:   Severe mitral regurgitation   AKI (acute kidney injury) (HCC)   On mechanically assisted ventilation (HCC)   Tracheostomy dependence (HCC)   Moderate aortic insufficiency   Acute respiratory failure with hypoxia (HCC)   Malnutrition of moderate degree   Tracheostomy in place (HCC)   PEG (percutaneous endoscopic gastrostomy) status (HCC)   Sacral ulcer (HCC)   Paroxysmal atrial fibrillation (HCC)   DVT (deep venous thrombosis) (HCC)   HTN (hypertension)   Rash   Upper GI bleed   Abnormal finding on GI tract imaging   Acute post-hemorrhagic anemia   Allergies  Allergen Reactions   Codeine     "sends me into orbit"    Scheduled Meds:  sodium chloride   Intravenous Once   amiodarone  200 mg Per Tube Daily   Chlorhexidine Gluconate Cloth  6 each Topical Daily   docusate  100 mg Per Tube BID   free water  150 mL Per Tube Q4H   furosemide  20 mg Intravenous Daily   gabapentin  100 mg Per Tube Q8H   insulin aspart  0-15 Units Subcutaneous Q4H   leptospermum manuka honey  1 Application  Topical Daily   miconazole   Topical BID   mupirocin ointment  1 Application Nasal BID   mouth rinse  15 mL Mouth Rinse Q2H   pantoprazole (PROTONIX) IV  40 mg Intravenous Q12H   polyethylene glycol  17 g Per Tube Daily   QUEtiapine  50 mg Per Tube TID   sertraline  100 mg Per Tube Daily   tranexamic acid  500 mg Nebulization Q8H   Continuous Infusions:  albumin human 60 mL/hr at 08/17/23 0658   feeding supplement (OSMOLITE 1.5 CAL) 30 mL/hr at 08/17/23 0658   promethazine (PHENERGAN) injection (IM or IVPB)     propofol (DIPRIVAN) infusion 5 mcg/kg/min (08/17/23 0658)   PRN Meds:.acetaminophen, ipratropium-albuterol, LORazepam, ondansetron (ZOFRAN) IV, mouth rinse, oxyCODONE,  phenol, promethazine (PHENERGAN) injection (IM or IVPB), zolpidem   SUBJECTIVE: Per his wife, rash mostly stable Afebrile Cr and lft normal/stable Platelet normal Slight uptick in wbc of unclear significance   Review of Systems: ROS All other ROS was negative, except mentioned above     OBJECTIVE: Vitals:   08/17/23 0815 08/17/23 0825 08/17/23 0900 08/17/23 1103  BP:   (!) 110/48   Pulse:   76   Resp:   20   Temp: 98.7 F (37.1 C)     TempSrc: Oral     SpO2:  100% 100% 100%  Weight:      Height:       Body mass index is 25.19 kg/m.  Physical Exam General/constitutional: sleeping; wife at bedside HEENT: Normocephalic Neck: trach site no bleeding CV: rrr no mrg Lungs: normal respiratory effort -- minimal vent setting Abd: Soft Skin: stable petechiael rash bilateral ankle joint mostly dorsal/lateral. Palm/soles faint erytehmatous macular rash    Lab Results Lab Results  Component Value Date   WBC 13.3 (H) 08/17/2023   HGB 8.3 (L) 08/17/2023   HCT 26.5 (L) 08/17/2023   MCV 97.4 08/17/2023   PLT 150 08/17/2023    Lab Results  Component Value Date   CREATININE 1.02 08/17/2023   BUN 25 (H) 08/17/2023   NA 142 08/17/2023   K 3.8 08/17/2023   CL 107 08/17/2023   CO2 30 08/17/2023    Lab Results  Component Value Date   ALT 15 08/17/2023   AST 27 08/17/2023   ALKPHOS 39 08/17/2023   BILITOT 1.7 (H) 08/17/2023      Microbiology: Recent Results (from the past 240 hours)  Culture, Respiratory w Gram Stain     Status: None   Collection Time: 08/12/23 10:32 AM   Specimen: Tracheal Aspirate; Respiratory  Result Value Ref Range Status   Specimen Description TRACHEAL ASPIRATE  Final   Special Requests NONE  Final   Gram Stain   Final    ABUNDANT WBC PRESENT, PREDOMINANTLY PMN RARE GRAM POSITIVE RODS RARE GRAM POSITIVE COCCI IN PAIRS    Culture   Final    MODERATE METHICILLIN RESISTANT STAPHYLOCOCCUS AUREUS MODERATE CORYNEBACTERIUM  STRIATUM Standardized susceptibility testing for this organism is not available. Performed at [facility], [address] [place], [place] [address]    Report Status 08/16/2023 FINAL  Final   Organism ID, Bacteria METHICILLIN RESISTANT STAPHYLOCOCCUS AUREUS  Final      Susceptibility   Methicillin resistant staphylococcus aureus - MIC*    CIPROFLOXACIN >=8 RESISTANT Resistant     ERYTHROMYCIN >=8 RESISTANT Resistant     GENTAMICIN <=0.5 SENSITIVE Sensitive     OXACILLIN >=4 RESISTANT Resistant     TETRACYCLINE <=1  SENSITIVE Sensitive     VANCOMYCIN 1 SENSITIVE Sensitive     TRIMETH/SULFA <=10 SENSITIVE Sensitive     CLINDAMYCIN <=0.25 SENSITIVE Sensitive     RIFAMPIN <=0.5 SENSITIVE Sensitive     Inducible Clindamycin NEGATIVE Sensitive     LINEZOLID 2 SENSITIVE Sensitive     * MODERATE METHICILLIN RESISTANT STAPHYLOCOCCUS AUREUS  MRSA Next Gen by PCR, Nasal     Status: Abnormal   Collection Time: 08/14/23  9:48 AM   Specimen: Nasal Mucosa; Nasal Swab  Result Value Ref Range Status   MRSA by PCR Next Gen DETECTED (A) NOT DETECTED Final    Comment: RESULT CALLED TO, READ BACK BY AND VERIFIED WITH: RN Rubin Payor on [phone] @1329H  by SM (NOTE) The GeneXpert MRSA Assay (FDA approved for NASAL specimens only), is one component of a comprehensive MRSA colonization surveillance program. It is not intended to diagnose MRSA infection nor to guide or monitor treatment for MRSA infections. Test performance is not FDA approved in patients less than 96 years old. Performed at [facility], [address], [place], [place] [address]   Culture, Respiratory w Gram Stain     Status: None (Preliminary result)   Collection Time: 08/15/23 10:02 AM   Specimen: Tracheal Aspirate; Respiratory  Result Value Ref Range Status   Specimen Description TRACHEAL ASPIRATE  Final   Special Requests NONE  Final   Gram Stain   Final    RARE SQUAMOUS EPITHELIAL CELLS PRESENT FEW WBC PRESENT,BOTH PMN  AND MONONUCLEAR RARE GRAM POSITIVE COCCI    Culture   Final    CULTURE REINCUBATED FOR BETTER GROWTH Performed at [facility], [address], [place], [place] [address]    Report Status PENDING  Incomplete  Culture, blood (Routine X 2) w Reflex to ID Panel     Status: None (Preliminary result)   Collection Time: 08/16/23  3:21 PM   Specimen: BLOOD RIGHT HAND  Result Value Ref Range Status   Specimen Description BLOOD RIGHT HAND  Final   Special Requests   Final    BOTTLES DRAWN AEROBIC AND ANAEROBIC Blood Culture adequate volume   Culture   Final    NO GROWTH < 24 HOURS Performed at [facility], [address], [place], [place] [address]    Report Status PENDING  Incomplete  Respiratory (~20 pathogens) panel by PCR     Status: None   Collection Time: 08/16/23  3:34 PM   Specimen: Nasopharyngeal Swab; Respiratory  Result Value Ref Range Status   Adenovirus NOT DETECTED NOT DETECTED Final   Coronavirus 229E NOT DETECTED NOT DETECTED Final    Comment: (NOTE) The Coronavirus on the Respiratory Panel, DOES NOT test for the novel  Coronavirus (2019 nCoV)    Coronavirus HKU1 NOT DETECTED NOT DETECTED Final   Coronavirus NL63 NOT DETECTED NOT DETECTED Final   Coronavirus OC43 NOT DETECTED NOT DETECTED Final   Metapneumovirus NOT DETECTED NOT DETECTED Final   Rhinovirus / Enterovirus NOT DETECTED NOT DETECTED Final   Influenza A NOT DETECTED NOT DETECTED Final   Influenza B NOT DETECTED NOT DETECTED Final   Parainfluenza Virus 1 NOT DETECTED NOT DETECTED Final   Parainfluenza Virus 2 NOT DETECTED NOT DETECTED Final   Parainfluenza Virus 3 NOT DETECTED NOT DETECTED Final   Parainfluenza Virus 4 NOT DETECTED NOT DETECTED Final   Respiratory Syncytial Virus NOT DETECTED NOT DETECTED Final   Bordetella pertussis NOT DETECTED NOT DETECTED Final   Bordetella  Parapertussis NOT DETECTED NOT DETECTED Final   Chlamydophila pneumoniae NOT DETECTED NOT DETECTED Final    Mycoplasma pneumoniae NOT DETECTED NOT DETECTED Final    Comment: Performed at [facility], [address], [place], [place] [address]  Culture, blood (Routine X 2) w Reflex to ID Panel     Status: None (Preliminary result)   Collection Time: 08/16/23  5:28 PM   Specimen: BLOOD RIGHT HAND  Result Value Ref Range Status   Specimen Description BLOOD RIGHT HAND  Final   Special Requests   Final    BOTTLES DRAWN AEROBIC AND ANAEROBIC Blood Culture results may not be optimal due to an inadequate volume of blood received in culture bottles   Culture   Final    NO GROWTH < 24 HOURS Performed at [facility], [address], [place], [place] [address]    Report Status PENDING  Incomplete     Serology:   Imaging: If present, new imagings (plain films, ct scans, and mri) have been personally visualized and interpreted; radiology reports have been reviewed. Decision making incorporated into the Impression / Recommendations.   Raymondo Band, MD [facility] [phone] pager    08/17/2023, 11:21 AM

## 2023-08-17 NOTE — Progress Notes (Signed)
Patient placed on 35% ATC after TXA treatment. Patient still has blood coming from trach when suctioned. Vitals are stable. RN made aware.

## 2023-08-17 NOTE — Evaluation (Signed)
Occupational Therapy Evaluation Patient Details Name: Joshua Eaton MRN: [id number] DOB: 1956-03-18 Today's Date: 08/17/2023   History of Present Illness 67 y.o. male [facility] to [facility] on 08/13/23 from [facility] with GI bleed. Pt also has red rash on his feet, left knee, and hands (work up underway for cause). LUE DVT from anticubital space to wrist. PMHx: colon CA s/p Rt hemicolectomy, MVR, COPD, PAF, LUE DVT, respiratory failure with trach, severe MR, PEG   Clinical Impression   This 67 yo male admitted with above presents to acute OT with PLOF before October to this year being totally independent with basic ADLs and IADLs. Since then he has been in hospital or [facility]. Currently he is min A-total a for all basic ADLs at bed level, he has good UB strength (with LUE distal DVT), and his alertness waxes and wanes. He will continue to benefit from acute OT with follow up at [facility]. Pt on vent at 40% weaning.      If plan is discharge home, recommend the following: Two people to help with walking and/or transfers;Two people to help with bathing/dressing/bathroom;Assistance with cooking/housework;Assistance with feeding;Help with stairs or ramp for entrance;Assist for transportation;Direct supervision/assist for financial management;Direct supervision/assist for medications management    Functional Status Assessment  Patient has had a recent decline in their functional status and demonstrates the ability to make significant improvements in function in a reasonable and predictable amount of time.  Equipment Recommendations  Hospital bed;Hoyer lift       Precautions / Restrictions Precautions Precautions: Fall Precaution Comments: trach collar, vent,  PEG, sacral wound, left dvt from wrist to anticubital space Restrictions Weight Bearing Restrictions Per Provider Order: No             ADL either performed or assessed with clinical judgement   ADL Overall ADL's :  Needs assistance/impaired Eating/Feeding: NPO   Grooming: Moderate assistance;Wash/dry face;Bed level   Upper Body Bathing: Total assistance;Bed level   Lower Body Bathing: Total assistance;+2 for physical assistance;Bed level   Upper Body Dressing : Total assistance;Bed level   Lower Body Dressing: Total assistance;+2 for physical assistance;Bed level                       Vision Patient Visual Report: No change from baseline Vision Assessment?: No apparent visual deficits            Pertinent Vitals/Pain Pain Assessment Pain Assessment: No/denies pain Faces Pain Scale: No hurt     Extremity/Trunk Assessment Upper Extremity Assessment Upper Extremity Assessment: Right hand dominant RUE Deficits / Details: 4/5 strength LUE Deficits / Details: 4/5 strength       Cervical / Trunk Assessment Cervical / Trunk Exceptions: sacral wound   Communication Communication Communication: Tracheostomy Cueing Techniques: Verbal cues   Cognition Arousal: Lethargic, Alert (eyes intermittently open) Behavior During Therapy: Flat affect                                   General Comments: pt followed 90% of one step commands                   Prior Functioning/Environment               Mobility Comments: per medical records, has been mostly bedbound or chair bound since Oct 2024 hospitalization          OT Problem  List: Decreased strength;Impaired balance (sitting and/or standing);Decreased cognition;Cardiopulmonary status limiting activity      OT Treatment/Interventions: Self-care/ADL training;Patient/family education;Balance training;Therapeutic exercise    OT Goals(Current goals can be found in the care plan section) Acute Rehab OT Goals Patient Stated Goal: wife hopeful for pt to potentially go to [facility] not back to [facility] OT Goal Formulation: With family Time For Goal Achievement: 08/31/23 Potential to Achieve Goals: Fair  OT  Frequency: Min 1X/week       AM-PAC OT "6 Clicks" Daily Activity     Outcome Measure Help from another person eating meals?: Total Help from another person taking care of personal grooming?: A Lot Help from another person toileting, which includes using toliet, bedpan, or urinal?: Total Help from another person bathing (including washing, rinsing, drying)?: Total Help from another person to put on and taking off regular upper body clothing?: Total Help from another person to put on and taking off regular lower body clothing?: Total 6 Click Score: 7   End of Session Nurse Communication:  (how patient did with me)  Activity Tolerance: Patient tolerated treatment well Patient left: in bed;with call bell/phone within reach (in chair position; did touch bed alarm so not sure if off or on)  OT Visit Diagnosis: Muscle weakness (generalized) (M62.81);Other symptoms and signs involving the nervous system (R29.898);Other symptoms and signs involving cognitive function                Time: 1440-1457 OT Time Calculation (min): 17 min Charges:  OT General Charges $OT Visit: 1 Visit OT Evaluation $OT Eval Moderate Complexity: 1 Mod  Cathy L. OT Acute Rehabilitation Services Office [phone]    Evette Georges 08/17/2023, 3:46 PM

## 2023-08-17 NOTE — Consult Note (Signed)
Palliative Care Consult Note                                  Date: 08/17/2023   Patient Name: Joshua Eaton  DOB: 06-02-1956  MRN: [id number]  Age / Sex: 67 y.o., male  PCP: Joshua Alken, MD Referring Physician: Steffanie Dunn, DO  Reason for Consultation: Establishing goals of care, advanced directives  HPI/Patient Profile: 67 y.o. male  with past medical history of chronic respiratory failure with tracheostomy and vent dependence who was admitted from [facility] on 08/13/2023 with GI bleeding. EGD 12/15 showed ulcer around PEG site but no active bleeding.  Palliative Medicine was consulted for goals of care and advanced care planning.   Significant Events: 05/20/23 - Initially presented to [facility] with increasing shortness of breath, admitted with ARDS, required intubation on 05/21/23.  06/03/23 - transferred to [facility] due to high vent requirements 06/06/23 - underwent tracheostomy 06/09/23 - TEE showed severe MR; not a surgical candidate per CT surgery 06/16/23 - diagnosed with left upper extremity DVT, started on anticoaglation 06/22/23 - went into new onset a-fib 06/28/23 - transferred to [facility], and later that day had an accidental trach dislodgement 07/01/23 - readmitted to [facility] due to increased work of breathing requiring intubation 07/05/23 - underwent tracheostomy 07/13/23 - discharged back to [facility] 08/13/23 - readmitted to [facility] with decrease in Hgb and concern for GIB  Past Medical History:  Diagnosis Date   Anxiety    Cancer (HCC)    Depression    Hypertension    OCD (obsessive compulsive disorder)     Subjective:   I have reviewed medical records including EPIC notes, labs and imaging. Discussed with Dr. Chestine Eaton and patient's RN.   Assessed the patient at bedside. He is awake and can appropriately respond to basic questions.   I met with his [name] to discuss diagnosis, prognosis, GOC,  and disposition.  I introduced Palliative Medicine as specialized medical care for people living with serious illness. It focuses on providing relief from the symptoms and stress of a serious illness.   Created space and opportunity for wife to express thoughts and feelings regarding current medical situation. Values and goals of care important to patient were attempted to be elicited.  Life Review: Patient and wife have been married for 47 years. 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 They share one son as well as a 31 year old grandson.   Functional Status: Prior to 05/20/23, patient was lived at home with his wife and was completely independent.   Today's Discussion: We reviewed patient's complicated illness course since he was initially admitted to the hospital on 05/20/23. We reviewed his multiple issues including respiratory failure and vent dependence, severe MR not a surgical candidate, DVT, a-fib, hemoptysis from trach irritation, and GI bleed.   Joshua Eaton expresses feeling anger and frustration regarding certain aspects of her husband's care along this course. Most of this frustration is related to the care patient received at [facility], especially when his trach was dislodged. Joshua Eaton is adamant that he will not return to [facility] but also understands that [facility] options are very limited.   She understands the seriousness of patient's current medical situation. She is starting to contemplate the possibility that he may be chronically vent dependent. At this time, she remains hopeful that his condition will improve.   She shares that  at one point when patient was able to speak using PMV, he stated to her "don't let me die". She also shares that patient has expressed fear around being left alone, which is why she stays with him every night.   We discussed advanced directives. She reports that [facility] will not release his medical records  to her because she is not his documented HCPOA. Discussed that we can attempt to create an HCPOA document, if patient is able to speak with a Passy Muir valve.   Joshua Eaton is intermittently emotional and tearful during our conversation, which is certainly understandable. Emotional support provided. Questions and concerns were addressed.    Review of Systems  Unable to perform ROS   Objective:   Primary Diagnoses: Present on Admission:  Acute GI bleeding  Acute respiratory failure with hypoxia (HCC)  AKI (acute kidney injury) (HCC)  Severe mitral regurgitation  Upper GI bleed  Moderate aortic insufficiency  Malnutrition of moderate degree   Physical Exam Constitutional:      General: He is awake. He is not in acute distress.    Appearance: He is ill-appearing.  Pulmonary:     Comments: Trach collar Skin:    Findings: Rash present.     Comments: Feet, hands, knees    Vital Signs:  BP (!) 117/58   Pulse 71   Temp 98.3 F (36.8 C) (Oral)   Resp 16   Ht 6' 0.99" (1.854 m)   Wt 86.6 kg   SpO2 99%   BMI 25.19 kg/m   Palliative Assessment/Data: PPS 30%     Assessment & Plan:   SUMMARY OF RECOMMENDATIONS   Continue full scope care Goal of care is medical stabilization SLP evaluation for Passy Muir Valve PMT will continue to follow and support  Primary Decision Maker: Unclear whether patient has capacity for medical decisions - will continue to assess Next of kin is his wife Joshua Eaton  Code Status/Advance Care Planning: Full code  Symptom Management:  Per attending  Prognosis:  Unable to determine  Discharge Planning:  To Be Determined    Thank you for allowing [place] to participate in the care of Joshua Eaton   Time Total: 80 minutes  Detailed review of medical records (labs, imaging, vital signs), medically appropriate exam, discussed with treatment team, counseling and education to patient, family, & staff, documenting clinical information, coordination of  care.   Signed by: Sherlean Foot, NP Palliative Medicine Team  Team Phone # [phone]  For individual providers, please see AMION

## 2023-08-18 DIAGNOSIS — K922 Gastrointestinal hemorrhage, unspecified: Secondary | ICD-10-CM | POA: Diagnosis not present

## 2023-08-18 DIAGNOSIS — J9601 Acute respiratory failure with hypoxia: Secondary | ICD-10-CM | POA: Diagnosis not present

## 2023-08-18 DIAGNOSIS — Z930 Tracheostomy status: Secondary | ICD-10-CM | POA: Diagnosis not present

## 2023-08-18 DIAGNOSIS — I340 Nonrheumatic mitral (valve) insufficiency: Secondary | ICD-10-CM | POA: Diagnosis not present

## 2023-08-18 DIAGNOSIS — Z9911 Dependence on respirator [ventilator] status: Secondary | ICD-10-CM | POA: Diagnosis not present

## 2023-08-18 DIAGNOSIS — R042 Hemoptysis: Secondary | ICD-10-CM | POA: Diagnosis not present

## 2023-08-18 LAB — CBC WITH DIFFERENTIAL/PLATELET
Abs Immature Granulocytes: 0.08 10*3/uL — ABNORMAL HIGH (ref 0.00–0.07)
Basophils Absolute: 0 10*3/uL (ref 0.0–0.1)
Basophils Relative: 0 %
Eosinophils Absolute: 0.4 10*3/uL (ref 0.0–0.5)
Eosinophils Relative: 4 %
HCT: 24.7 % — ABNORMAL LOW (ref 39.0–52.0)
Hemoglobin: 7.5 g/dL — ABNORMAL LOW (ref 13.0–17.0)
Immature Granulocytes: 1 %
Lymphocytes Relative: 10 %
Lymphs Abs: 1 10*3/uL (ref 0.7–4.0)
MCH: 30.1 pg (ref 26.0–34.0)
MCHC: 30.4 g/dL (ref 30.0–36.0)
MCV: 99.2 fL (ref 80.0–100.0)
Monocytes Absolute: 0.6 10*3/uL (ref 0.1–1.0)
Monocytes Relative: 5 %
Neutro Abs: 8.2 10*3/uL — ABNORMAL HIGH (ref 1.7–7.7)
Neutrophils Relative %: 80 %
Platelets: 102 10*3/uL — ABNORMAL LOW (ref 150–400)
RBC: 2.49 MIL/uL — ABNORMAL LOW (ref 4.22–5.81)
RDW: 17 % — ABNORMAL HIGH (ref 11.5–15.5)
WBC: 10.2 10*3/uL (ref 4.0–10.5)
nRBC: 0 % (ref 0.0–0.2)

## 2023-08-18 LAB — CULTURE, RESPIRATORY W GRAM STAIN

## 2023-08-18 LAB — COMPREHENSIVE METABOLIC PANEL
ALT: 13 U/L (ref 0–44)
AST: 21 U/L (ref 15–41)
Albumin: 2.9 g/dL — ABNORMAL LOW (ref 3.5–5.0)
Alkaline Phosphatase: 42 U/L (ref 38–126)
Anion gap: 7 (ref 5–15)
BUN: 20 mg/dL (ref 8–23)
CO2: 30 mmol/L (ref 22–32)
Calcium: 8.4 mg/dL — ABNORMAL LOW (ref 8.9–10.3)
Chloride: 107 mmol/L (ref 98–111)
Creatinine, Ser: 1.02 mg/dL (ref 0.61–1.24)
GFR, Estimated: >60 mL/min (ref >60)
Glucose, Bld: 140 mg/dL — ABNORMAL HIGH (ref 70–99)
Potassium: 3.7 mmol/L (ref 3.5–5.1)
Sodium: 144 mmol/L (ref 135–145)
Total Bilirubin: 1.6 mg/dL — ABNORMAL HIGH (ref <1.2)
Total Protein: 5.5 g/dL — ABNORMAL LOW (ref 6.5–8.1)

## 2023-08-18 LAB — GC/CHLAMYDIA PROBE AMP (~~LOC~~) NOT AT ARMC
Chlamydia: NEGATIVE
Comment: NEGATIVE
Comment: NORMAL
Neisseria Gonorrhea: NEGATIVE

## 2023-08-18 LAB — GLUCOSE, CAPILLARY
Glucose-Capillary: 103 mg/dL — ABNORMAL HIGH (ref 70–99)
Glucose-Capillary: 107 mg/dL — ABNORMAL HIGH (ref 70–99)
Glucose-Capillary: 115 mg/dL — ABNORMAL HIGH (ref 70–99)
Glucose-Capillary: 124 mg/dL — ABNORMAL HIGH (ref 70–99)
Glucose-Capillary: 130 mg/dL — ABNORMAL HIGH (ref 70–99)
Glucose-Capillary: 138 mg/dL — ABNORMAL HIGH (ref 70–99)

## 2023-08-18 LAB — PHOSPHORUS: Phosphorus: 2.9 mg/dL (ref 2.5–4.6)

## 2023-08-18 LAB — MAGNESIUM: Magnesium: 2.2 mg/dL (ref 1.7–2.4)

## 2023-08-18 MED ORDER — JUVEN PO PACK
1.0000 | PACK | Freq: Two times a day (BID) | ORAL | Status: DC
  Administered 2023-08-18 – 2023-09-03 (×32): 1
  Filled 2023-08-18 (×32): qty 1

## 2023-08-18 MED ORDER — POTASSIUM CHLORIDE 20 MEQ PO PACK
40.0000 meq | PACK | Freq: Once | ORAL | Status: AC
  Administered 2023-08-18: 40 meq
  Filled 2023-08-18: qty 2

## 2023-08-18 MED ORDER — LORAZEPAM 2 MG/ML IJ SOLN
1.0000 mg | INTRAMUSCULAR | Status: DC | PRN
  Administered 2023-08-18 – 2023-08-19 (×2): 1 mg via INTRAVENOUS
  Filled 2023-08-18 (×2): qty 1

## 2023-08-18 MED ORDER — TRANEXAMIC ACID FOR INHALATION
500.0000 mg | Freq: Three times a day (TID) | RESPIRATORY_TRACT | Status: AC
  Administered 2023-08-18 – 2023-08-19 (×6): 500 mg via RESPIRATORY_TRACT
  Filled 2023-08-18 (×6): qty 10

## 2023-08-18 MED ORDER — FUROSEMIDE 40 MG PO TABS
40.0000 mg | ORAL_TABLET | Freq: Every day | ORAL | Status: DC
  Administered 2023-08-18 – 2023-08-19 (×2): 40 mg
  Filled 2023-08-18 (×2): qty 1

## 2023-08-18 NOTE — Progress Notes (Signed)
Patient placed on 40% ATC.  Tolerating well at this time.  Will continue to monitor.

## 2023-08-18 NOTE — Progress Notes (Signed)
Nutrition Follow-up  DOCUMENTATION CODES:   Non-severe (moderate) malnutrition in context of chronic illness  INTERVENTION:  Continue TF via PEG: Osmolite 1.5 at 45ml/hr (1440 ml per day) 60ml Prosource TF20 BID q4h free water flush ( daily) Provides 2320 kcal, 130g protein, and total free water (TF+ FWF)  Add 1 packet Juven BID per tube, each packet provides 95 calories, 2.5 grams of protein (collagen), and 9.8 grams of carbohydrate (3 grams sugar) to support wound healing  NUTRITION DIAGNOSIS:   Moderate Malnutrition related to chronic illness as evidenced by severe muscle depletion, moderate muscle depletion, mild fat depletion. - remains applicable  GOAL:   Patient will meet greater than or equal to 90% of their needs - met via TF at goal  MONITOR:   Vent status, TF tolerance, Skin  REASON FOR ASSESSMENT:   Consult Enteral/tube feeding initiation and management  ASSESSMENT:   67 yo male admitted with possible GI Bleed from [facility]; pt with hx of chronic respiratory failure requiring chronic trach/vent. Noted pt with prolonged hospital stay on October for pneumonia, severe MR, pulmonary edema requiring trach/PEG placement. PMH includes HTN, depression/amxiety, OCD  12/15 EGD: erosion around PEG site but no active bleeding; cratered ulcer near PEG site biopsied   Currently on trach collar, off vent support.  TF infusing at goal rate via PEG tube.  Wife at bedside reports that pt has had no additional episodes of emesis. Abdomen soft and having BM's.   Discussed with pt's wife adding back juven to support wound healing. She is agreeable.   SLP following pt to for readiness of PMV trials.   No updated weight on file since 12/14  Medications: colace BID, lasix 40mg  daily, SSI 0-15 units q4h, protonix   Labs: CBG's 107-138 x24 hours  Diet Order:   Diet Order             Diet NPO time specified  Diet effective now                    EDUCATION NEEDS:   Not appropriate for education at this time  Skin:  Skin Integrity Issues:: Unstageable Unstageable: sacrum Other: round rash on L knee; Petechial rash on hands and fee  Last BM:  12/18  Height:   Ht Readings from Last 1 Encounters:  08/17/23 6' 0.99" (1.854 m)    Weight:   Wt Readings from Last 1 Encounters:  08/14/23 86.6 kg   BMI:  Body mass index is 25.19 kg/m.  Estimated Nutritional Needs:   Kcal:  2200-2400 kcals  Protein:  125-150 g  Fluid:  >/= 2L  Drusilla Kanner, RDN, LDN Clinical Nutrition

## 2023-08-18 NOTE — Progress Notes (Signed)
Pathology results from Endoscopy came back as follows:  FINAL MICROSCOPIC DIAGNOSIS:   A. STOMACH, ANTRUM, BIOPSY:       Gastric antral mucosa without significant diagnostic alteration.       No H. pylori identified on HE stain.       Negative for intestinal metaplasia or dysplasia.    No concerning findings on pathology. Recall I suspect he likely has or had an ulcer in the antrum that was quite fibrotic and difficult to visualize. No mass lesion obvious, imaging did not show that. Biopsies for H pylori negative. He should stay on protonix 40mg  BID indefinitely especially if resumption of anticoagulation is considered at some point. His primary GI MD is at [facility], once through his acute hospitalization he can follow up with them to determine if they would want to pursue a follow up EGD, understanding that given his significant comorbidities that may be difficult for him to do.   Call with questions.  Harlin Rain, MD [facility]

## 2023-08-18 NOTE — Progress Notes (Signed)
RT NOTE: upon arrival to give TXA treatment, patient was noted to be breathing in the 40s with increased work of breathing.  Patient placed on ventilator on CPAP/PSV of 12/5 and stated that it felt better.  Tolerating well at this time.  Will continue to monitor.

## 2023-08-18 NOTE — Progress Notes (Signed)
NAME:  Joshua Eaton, MRN:  [id number], DOB:  1955-10-29, LOS: 4 ADMISSION DATE:  08/13/2023, CONSULTATION DATE:  12/13 REFERRING MD:  Dr. Eloise Harman, CHIEF COMPLAINT:  GIB   History of Present Illness:  Patient is a 67 yo M w/ pertinent PMH chronic respiratory failure w/ tracheostomy/[facility] dependent from [facility], Afib on eliquis presents to [facility] ED on 12/13 w/ GIB.  Patient recently admitted w/ respiratory failure and pna in October 2024 requiring tracheostomy. He was later transferred to [facility] on 10/28. Later that day had an accidental trach dislodgement. His wob continued to increase requiring re-admission to [facility] on 10/31 and was intubated and later trached during hospital admission. Patient discharged on 11/12 [facility].   On 12/13 patient admitted to [facility] w/ drop in hgb and concern for GIB. GI consulted for possible endoscopy. TRH to admit and PCCM to consult for vent management.  Pertinent  Medical History   Past Medical History:  Diagnosis Date   Anxiety    Cancer (HCC)    Depression    Hypertension    OCD (obsessive compulsive disorder)    Significant Hospital Events: Including procedures, antibiotic start and stop dates in addition to other pertinent events   12/13 admitted w/ GIB. Pccm to consult for vent management 12/14 1 unit pRBC 12/15 EGD - erosion around PEG site but no active bleeding; cratered ulcer biopsied near peg site.  12/16 plugging with clots- posterior wall bleeding from distal end of trach  Interim History / Subjective:  Switched to PS/CPAP overnight due to dyssynchrony.   Objective   Blood pressure (!) 122/58, pulse 84, temperature 97.7 F (36.5 C), temperature source Oral, resp. rate (!) 25, height 6' 0.99" (1.854 m), weight 86.6 kg, SpO2 100%.    Vent Mode: PSV;BIPAP FiO2 (%):  [35 %-50 %] 40 % Set Rate:  [20 bmp] 20 bmp Vt Set:  [470 mL] 470 mL PEEP:  [5 cmH20-10 cmH20] 5 cmH20 Pressure Support:  [10 cmH20] 10  cmH20 Plateau Pressure:  [24 cmH20] 24 cmH20   Intake/Output Summary (Last 24 hours) at 08/18/2023 0743 Last data filed at 08/18/2023 0600 Gross per 24 hour  Intake 1337.36 ml  Output 1050 ml  Net 287.36 ml   Filed Weights   08/14/23 0453  Weight: 86.6 kg    Examination: General: Chronically ill-appearing man lying in bed no acute distress, not sedated, sleeping HEENT: /AT, eyes anicteric Neck: Trach in place, no bleeding around stoma Neuro: Arouses to stimulation, nodding to answer questions, moving all extremities spontaneously CV: S1-S2, regular rate and rhythm PULM: Dark for bloody output from trach, rhonchi cleared with suctioning GI: Soft, nontender Extremities: No peripheral edema, no cyanosis Skin: Rash on the knees improving.  Rash on hands improving.  Persistent on dorsal surface of feet.  K+ 3.7 BUN 20 Cr 1.02  T bili 1.6 H/H 7.5/24.7 Platelets 102 RPR negative, HIV negative Blood culture 12/16: No growth to date RVP negative 12/16 Pathology-normal gastric antral mucosa Right upper quadrant ultrasound: Previous cholecystectomy, no ductal dilation.  Fatty liver.  Resolved Hospital Problem list   Hypokalemia  Assessment & Plan:  ABLA 2/2 UGIB/ duodenitis, ongoing slow trend down in Hb- CTA showed active bleeding from gastric antrum/ pylorus with duodenitis. Multiple transfusions upstairs, 1 unit since admission. -gastric biopsy pending; appreciate GI ([place]) commendations -Continue high-dose PPI definitely per GI -Daily CBC unless bleeding still ARDS, transfuse for hemoglobin less than 7 or hemodynamically significant bleeding - Continue holding Eliquis  Elevated d-dimer despite treatment for DVT with [facility] for the past few months -Monitor; he continues to have very high risk for clotting with immobility and ongoing sources of inflammation -Continue holding apixaban  Acute kidney injury, resolved -Strict I's/O - Renally dose meds, avoid nephrotoxic  meds  Hemoptysis- secondary to irritation of his trach on the posterior tracheal wall Chronic respiratory failure- got to 20h TC at [facility], family thinks panic attacks have limited vent weaning. Has been deemed chronic vent dependent  Trach & PEG dependent Questionable bibasilar atelectasis vs infiltrate Acute pulmonary edema P:  - avoid systemic TXA due to high clotting TXA; continue TXA nebs another day - Bivona trach is been ordered, hopefully will be here by the end of the week -Gabapentin added 12/17 to help cough -Trach collar during the day - Bronchodilators - Trach care per protocol.  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 - Gabapentin added for coughing. - Continue bedtime Ambien.  LUE provoked DVT from picc line -Apixaban on hold due to bleeding  HTN; not an issue -Monitor off meds  Rash on knee-- looks like ringworm -Contact precautions - Topical antifungal  Petechial rash on hands and feet; blood cultures and serologies negative so far -Appreciate ID's management. -Follow blood cultures  Mild hyperglycemia - Sliding scale insulin as needed - Goal blood glucose 140-180  Unstagable pressure ulcer on sacrum POA -Optimize nutrition, continue tube feeds at goal - Wound care - Frequent turns  Moderate protein energy malnutrition  -Continue tube feeds  Severe deconditioning  -PT, OT -Optimize nutrition  -Wife updated at bedside during rounds  Best practice:  Diet: TF Pain/Anxiety/Delirium protocol (if indicated): N/A VAP protocol (if indicated): In  place DVT prophylaxis: SCD GI prophylaxis: PPI Glucose control: N/A- CBGs in 90s Mobility: turn, progress as tolerated Sacral wound stage 4+ POA Code Status: full  Family Communication: wife updated at bedside 12/16 Disposition: Remains in ICU due to vent dependence   This patient is critically ill with multiple organ system failure which requires frequent high complexity decision making, assessment, support, evaluation, and titration of therapies. This was completed through the application of advanced monitoring technologies and extensive interpretation of multiple databases. During this encounter critical care time was devoted to patient care services described in this note for 36 minutes.  Steffanie Dunn, DO 08/18/23 7:56 AM [facility]  For contact information, see Amion. If no response to pager, please call PCCM consult pager. After hours, 7PM- 7AM, please call Elink.

## 2023-08-18 NOTE — Progress Notes (Signed)
Palliative Medicine Progress Note   Patient Name: Joshua Eaton       Date: 08/18/2023 DOB: [birth year]  Age: 67 y.o. MRN#: [id number] Attending Physician: Joshua Dunn, DO Primary Care Physician: Joshua Alken, MD Admit Date: 08/13/2023   HPI/Patient Profile: HPI/Patient Profile: 67 y.o. male  with past medical history of chronic respiratory failure with tracheostomy and vent dependence who was admitted from [facility] on 08/13/2023 with GI bleeding. EGD 12/15 showed ulcer around PEG site but no active bleeding.  Palliative Medicine was consulted for goals of care and advanced care planning.    Significant Events: 05/20/23 - Initially presented to [facility] with increasing shortness of breath, admitted with ARDS, required intubation on 05/21/23.  06/03/23 - transferred to [facility] due to high vent requirements 06/06/23 - underwent tracheostomy 06/09/23 - TEE showed severe MR; not a surgical candidate per CT surgery 06/16/23 - diagnosed with left upper extremity DVT, started on anticoaglation 06/22/23 - went into new onset a-fib 06/28/23 - transferred to [facility], and later that day had an accidental trach dislodgement 07/01/23 - readmitted to [facility] due to increased work of breathing requiring intubation 07/05/23 - underwent tracheostomy 07/13/23 - discharged back to [facility] 08/13/23 - readmitted to [facility] with decrease in Hgb and concern for GIB  Subjective: Chart reviewed. Patient seen by SLP this morning; unfortunately he did not tolerate cuff deflation and PMV was not placed.   Patient seen and examined at bedside; update received from RN.   Per wife's request, I wrote a letter stating that patient is currently hospitalized, critically ill, and unable to make medical  decisions.  The intent of this letter is to help wife obtain medical records from [facility].  I spoke with [name] by phone. I let her know that I left the letter with patient's RN.  She does not have additional questions or concerns at this time.  Offered ongoing palliative support as needed.  Objective:  Physical Exam Vitals reviewed.  Constitutional:      General: He is awake. He is not in acute distress.    Appearance: He is ill-appearing.  Pulmonary:     Comments: Trach Skin:    Findings: Rash present.     Comments: Both knees, hands, feet  Palliative Medicine Assessment & Plan   Assessment: Principal Problem:   Acute GI bleeding Active Problems:   Severe mitral regurgitation   AKI (acute kidney injury) (HCC)   On mechanically assisted ventilation (HCC)   Tracheostomy dependence (HCC)   Moderate aortic insufficiency   Acute respiratory failure with hypoxia (HCC)   Malnutrition of moderate degree   Tracheostomy in place (HCC)   PEG (percutaneous endoscopic gastrostomy) status (HCC)   Sacral ulcer (HCC)   Paroxysmal atrial fibrillation (HCC)   DVT (deep venous thrombosis) (HCC)   HTN (hypertension)   Rash   Upper GI bleed   Abnormal finding on GI tract imaging   Acute post-hemorrhagic anemia    Recommendations/Plan: Continue full scope care Goal of care is medical stabilization Appreciate SLP evaluation for Passy Muir Valve PMT will continue to follow and support  Primary Decision Maker: Unclear whether patient has capacity for medical decisions - will continue to assess Next of kin is his wife Joshua Eaton  Code Status: Full code  Prognosis:  Unable to determine  Discharge Planning: To Be Determined   Thank you for allowing the Palliative Medicine Team to assist in the care of this patient.   Time: 38 minutes   Merry Proud, NP   Please contact Palliative Medicine Team phone at [phone] for questions and concerns.   For individual providers, please see AMION.

## 2023-08-18 NOTE — TOC Progression Note (Signed)
Transition of Care [facility] - Progression Note    Patient Details  Name: Joshua Eaton MRN: [id number] Date of Birth: Jan 11, 1956  Transition of Care [facility]) CM/SW Contact  Tom-Johnson, Hershal Coria, RN Phone Number: 08/18/2023, 4:50 PM  Clinical Narrative:     CM informed by DJ at [facility] that patient's wife, [name] consented to start Insurance Auth.   CM will continue to follow.        Expected Discharge Plan and Services                                               Social Determinants of Health (SDOH) Interventions SDOH Screenings   Food Insecurity: Patient Unable To Answer (07/14/2023)   Received from [facility]  Housing: Patient Unable To Answer (07/14/2023)   Received from [facility]  Transportation Needs: Patient Unable To Answer (07/14/2023)   Received from [facility]  Utilities: Patient Unable To Answer (07/14/2023)   Received from [facility]  Financial Resource Strain: Patient Unable To Answer (07/14/2023)   Received from [facility]  Social Connections: Patient Unable To Answer (07/14/2023)   Received from [facility]  Stress: Patient Unable To Answer (07/14/2023)   Received from [facility]  Tobacco Use: Low Risk  (08/15/2023)    Readmission Risk Interventions    08/16/2023    3:55 PM  Readmission Risk Prevention Plan  Transportation Screening Complete  Medication Review (RN Care Manager) Referral to Pharmacy  PCP or Specialist appointment within 3-5 days of discharge Complete  HRI or Home Care Consult Complete  SW Recovery Care/Counseling Consult Complete  Skilled Nursing Facility Complete

## 2023-08-18 NOTE — Evaluation (Signed)
Passy-Muir Speaking Valve - Evaluation Patient Details  Name: Joshua Eaton MRN: [id number] Date of Birth: 1956-04-06  Today's Date: 08/18/2023 Time: 0955-1020 SLP Time Calculation (min) (ACUTE ONLY): 25 min  Past Medical History:  Past Medical History:  Diagnosis Date   Anxiety    Cancer (HCC)    Depression    Hypertension    OCD (obsessive compulsive disorder)    Past Surgical History:  Past Surgical History:  Procedure Laterality Date   BIOPSY  08/15/2023   Procedure: BIOPSY;  Surgeon: Benancio Deeds, MD;  Location: [facility] ENDOSCOPY;  Service: Gastroenterology;;   CHOLECYSTECTOMY     COLON SURGERY     cancerous polyp removed   ESOPHAGOGASTRODUODENOSCOPY (EGD) WITH PROPOFOL N/A 08/15/2023   Procedure: ESOPHAGOGASTRODUODENOSCOPY (EGD) WITH PROPOFOL;  Surgeon: Benancio Deeds, MD;  Location: [facility] ENDOSCOPY;  Service: Gastroenterology;  Laterality: N/A;   HERNIA REPAIR     IR GASTROSTOMY TUBE MOD SED  06/24/2023   HPI:  Patient is a 67 y.o. male with PMH: chronic respiratory failure with tracheostomy and vent dependence, a-fib on Eliquis, anxiety, OCD, depression. He was recently admitted with respiratory failure and PNA in October of 2024 requiring tracheostomy; PEG placed 10/24 and he was transferred to [facility] on 10/28. His trach dislodged later that day and WOB increased and he was re-admitted to [facility] 10/31 where he was intubated and later was trached. He was discharged back to [facility] on 11/12. He presented to [facility] on 08/13/23 with drdop in Hgb and concern for GI bleed. EGD completed 12/15 which revealed erosion around PEG site but no active bleeding; cratered ulcer biopsied near PEG site. On 12/16 he had plugging with clots from posterior wall bleeding from distal end of trach. Bivona trach has been ordered. SLP order for PMV evaluation placed on 12/17.    Assessment / Plan / Recommendation  Clinical Impression  Patient was alert and cooperative for evaluation. SLP  completed oral care with patient having minimal amount of secretions on tongue and along gumline. Oral mus[place]a was pink and moist. Prior to cuff deflation, SpO2 was in the mid 90% and RR in mid-20's. Almost immediately after first cuff deflation, patient with increased WOB, trachael cough and expectoration of moderate amount of thick, clearish-bloody secretions; RR increased to as high as 40 and SpO2 decreased as low as 80%. Breathing did appear to improve after expectoration of secretions. SLP reinflated trach cuff and his SpO2 slowly improved to baseline 94%, RR remained in range of 25-30. After allowing patient to recover, SLP did attempt cuff deflation again, with the same overall result as initial deflation. Again, his SpO2 improved back to baseline after cuff reinflated. SLP will follow patient for readiness to attempt PMV placement. SLP Visit Diagnosis: Aphonia (R49.1)    SLP Assessment  Patient needs continued Speech Lanaguage Pathology Services    Recommendations for follow up therapy are one component of a multi-disciplinary discharge planning process, led by the attending physician.  Recommendations may be updated based on patient status, additional functional criteria and insurance authorization.  Follow Up Recommendations  SLP at Long-term acute care hospital    Assistance Recommended at Discharge Frequent or constant Supervision/Assistance  Functional Status Assessment Patient has had a recent decline in their functional status and demonstrates the ability to make significant improvements in function in a reasonable and predictable amount of time.  Frequency and Duration min 3x week  2 weeks    PMSV Trial PMSV was placed for: not placed  Tracheostomy Tube  Additional Tracheostomy Tube Assessment Level of Secretion Expectoration: Tracheal    Vent Dependency  Vent Dependent: Yes FiO2 (%): 40 %    Cuff Deflation Trial Tolerated Cuff Deflation: No Behavior:  Anxious;Alert;Restless Cuff Deflation Trial - Comments: cuff deflation trial x2 resulted in SpO2 decreasing to as low as 80%, increased WOB and increasted labored respiration and patient with moderate amount of thick clearish-bloody secretions from trach         Angela Nevin, MA, CCC-SLP Speech Therapy

## 2023-08-19 DIAGNOSIS — J9611 Chronic respiratory failure with hypoxia: Secondary | ICD-10-CM | POA: Diagnosis not present

## 2023-08-19 DIAGNOSIS — Z930 Tracheostomy status: Secondary | ICD-10-CM | POA: Diagnosis not present

## 2023-08-19 DIAGNOSIS — D696 Thrombocytopenia, unspecified: Secondary | ICD-10-CM

## 2023-08-19 DIAGNOSIS — K922 Gastrointestinal hemorrhage, unspecified: Secondary | ICD-10-CM | POA: Diagnosis not present

## 2023-08-19 LAB — COMPREHENSIVE METABOLIC PANEL
ALT: 12 U/L (ref 0–44)
AST: 18 U/L (ref 15–41)
Albumin: 2.6 g/dL — ABNORMAL LOW (ref 3.5–5.0)
Alkaline Phosphatase: 46 U/L (ref 38–126)
Anion gap: 4 — ABNORMAL LOW (ref 5–15)
BUN: 23 mg/dL (ref 8–23)
CO2: 29 mmol/L (ref 22–32)
Calcium: 8.2 mg/dL — ABNORMAL LOW (ref 8.9–10.3)
Chloride: 108 mmol/L (ref 98–111)
Creatinine, Ser: 0.87 mg/dL (ref 0.61–1.24)
GFR, Estimated: >60 mL/min (ref >60)
Glucose, Bld: 116 mg/dL — ABNORMAL HIGH (ref 70–99)
Potassium: 4.1 mmol/L (ref 3.5–5.1)
Sodium: 141 mmol/L (ref 135–145)
Total Bilirubin: 1.5 mg/dL — ABNORMAL HIGH (ref <1.2)
Total Protein: 5.4 g/dL — ABNORMAL LOW (ref 6.5–8.1)

## 2023-08-19 LAB — CBC WITH DIFFERENTIAL/PLATELET
Abs Immature Granulocytes: 0.1 10*3/uL — ABNORMAL HIGH (ref 0.00–0.07)
Basophils Absolute: 0 10*3/uL (ref 0.0–0.1)
Basophils Relative: 0 %
Eosinophils Absolute: 0.6 10*3/uL — ABNORMAL HIGH (ref 0.0–0.5)
Eosinophils Relative: 5 %
HCT: 25.7 % — ABNORMAL LOW (ref 39.0–52.0)
Hemoglobin: 8 g/dL — ABNORMAL LOW (ref 13.0–17.0)
Immature Granulocytes: 1 %
Lymphocytes Relative: 14 %
Lymphs Abs: 1.6 10*3/uL (ref 0.7–4.0)
MCH: 30.5 pg (ref 26.0–34.0)
MCHC: 31.1 g/dL (ref 30.0–36.0)
MCV: 98.1 fL (ref 80.0–100.0)
Monocytes Absolute: 0.5 10*3/uL (ref 0.1–1.0)
Monocytes Relative: 5 %
Neutro Abs: 8.3 10*3/uL — ABNORMAL HIGH (ref 1.7–7.7)
Neutrophils Relative %: 75 %
Platelets: 105 10*3/uL — ABNORMAL LOW (ref 150–400)
RBC: 2.62 MIL/uL — ABNORMAL LOW (ref 4.22–5.81)
RDW: 17.1 % — ABNORMAL HIGH (ref 11.5–15.5)
WBC: 11.1 10*3/uL — ABNORMAL HIGH (ref 4.0–10.5)
nRBC: 0 % (ref 0.0–0.2)

## 2023-08-19 LAB — MAGNESIUM: Magnesium: 2.1 mg/dL (ref 1.7–2.4)

## 2023-08-19 LAB — PHOSPHORUS: Phosphorus: 2.4 mg/dL — ABNORMAL LOW (ref 2.5–4.6)

## 2023-08-19 LAB — GLUCOSE, CAPILLARY
Glucose-Capillary: 125 mg/dL — ABNORMAL HIGH (ref 70–99)
Glucose-Capillary: 113 mg/dL — ABNORMAL HIGH (ref 70–99)
Glucose-Capillary: 132 mg/dL — ABNORMAL HIGH (ref 70–99)
Glucose-Capillary: 132 mg/dL — ABNORMAL HIGH (ref 70–99)
Glucose-Capillary: 104 mg/dL — ABNORMAL HIGH (ref 70–99)
Glucose-Capillary: 109 mg/dL — ABNORMAL HIGH (ref 70–99)

## 2023-08-19 MED ORDER — CHLORHEXIDINE GLUCONATE CLOTH 2 % EX PADS
6.0000 | MEDICATED_PAD | Freq: Every day | CUTANEOUS | Status: DC
  Administered 2023-08-19 – 2023-08-22 (×4): 6 via TOPICAL

## 2023-08-19 MED ORDER — LORAZEPAM 2 MG/ML IJ SOLN
1.0000 mg | Freq: Three times a day (TID) | INTRAMUSCULAR | Status: DC | PRN
  Administered 2023-08-19 – 2023-08-30 (×15): 1 mg via INTRAVENOUS
  Filled 2023-08-19 (×18): qty 1

## 2023-08-19 NOTE — Progress Notes (Signed)
NAME:  Joshua Eaton, MRN:  [id number], DOB:  11-20-1955, LOS: 5 ADMISSION DATE:  08/13/2023, CONSULTATION DATE:  12/13 REFERRING MD:  Dr. Eloise Harman, CHIEF COMPLAINT:  GIB   History of Present Illness:  Patient is a 67 yo M w/ pertinent PMH chronic respiratory failure w/ tracheostomy/[facility], Afib on eliquis presents to [facility] ED on 12/13 w/ GIB.  Patient recently admitted w/ respiratory failure and pna in October 2024 requiring tracheostomy. He was later transferred to [facility] on 10/28. Later that day had an accidental trach dislodgement. His wob continued to increase requiring re-admission to [facility] on 10/31 and was intubated and later trached during hospital admission. Patient discharged on 11/[facility].   On 12/13 patient admitted to [facility] w/ drop in hgb and concern for GIB. GI consulted for possible endoscopy. TRH to admit and PCCM to consult for vent management.  Pertinent  Medical History   Past Medical History:  Diagnosis Date   Anxiety    Cancer (HCC)    Depression    Hypertension    OCD (obsessive compulsive disorder)    Significant Hospital Events: Including procedures, antibiotic start and stop dates in addition to other pertinent events   12/13 admitted w/ GIB. Pccm to consult for vent management 12/14 1 unit pRBC 12/15 EGD - erosion around PEG site but no active bleeding; cratered ulcer biopsied near peg site.  12/16 plugging with clots- posterior wall bleeding from distal end of trach  Interim History / Subjective:  Very agitated overnight, back in mittens due to concern he was reaching for his trach.  Objective   Blood pressure 109/74, pulse 78, temperature 99.7 F (37.6 C), temperature source Axillary, resp. rate (!) 25, height 6' 0.99" (1.854 m), weight 86.6 kg, SpO2 95%.    Vent Mode: PSV;CPAP FiO2 (%):  [40 %] 40 % PEEP:  [5 cmH20] 5 cmH20 Pressure Support:  [12 cmH20] 12 cmH20   Intake/Output Summary (Last 24  hours) at 08/19/2023 4098 Last data filed at 08/19/2023 0600 Gross per 24 hour  Intake 1440 ml  Output 2000 ml  Net -560 ml   Filed Weights   08/14/23 0453  Weight: 86.6 kg    Examination: General: chronically ill appearing man lying in bed in NAD HEENT:  [place]/AT, eyes anicteric  Neck: trach in place Neuro: RASS -5, on no continuous sedation. Breathing comfortably on PS. CV: S1S2, RRR PULM: dark bloody output reduced from trach, no rhonchi today. CTAB. GI: soft, NT Extremities: No cyanosis or edema. Skin: rash on knees improving, feet not worse, but rash is still bright red.   BUN 23 Cr 0.87 T bili 1.5 H/H 8/25.7 Platelets 105 RPR negative, HIV negative Blood culture 12/16:  NGTD RVP negative 12/16 Pathology-normal gastric antral mucosa Right upper quadrant ultrasound: Previous cholecystectomy, no ductal dilation.  Fatty liver.  Resolved Hospital Problem list   Hypokalemia HTN  Assessment & Plan:  ABLA 2/2 UGIB/ duodenitis, ongoing slow trend down in Hb- CTA showed active bleeding from gastric antrum/ pylorus with duodenitis. Multiple transfusions upstairs, 1 unit since admission. -gastric biopsy reassurring -con't high dose PPI indefinitely per GI, especially since he will eventually need to re-trial DOAC -transfuse for Hb <7 or hemodynamically significant bleeding - con't holding apixaban  Elevated d-dimer despite treatment for DVT with [facility] for the past few months -high risk for recurrent clots; con't holding apixaban until hemoptysis resolves  Acute kidney injury, resolved -strict I/O -renally dose meds, avoid  nephrotoxic meds  Hemoptysis- secondary to irritation of his trach on the posterior tracheal wall Chronic respiratory failure- got to 20h TC at [facility], family thinks panic attacks have limited vent weaning. Has been deemed chronic vent dependent  Trach & PEG dependent Questionable bibasilar atelectasis vs infiltrate Acute pulmonary edema P:  - recommend  against systemic TXA due to high clotting risk -inhaled TXA -bivona trach ordered -gabapentin added to help with cough -trach care per protocol -trach collar trials during the day -trach care per protocol -although his wife thinks his chances of disloging his trach again is low, I remain concerned. While she is at bedside and he is sleeping we can remove his mitts, but if he starts pulling at his trach, the mitts will need to be replaced.  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 - not operative candidate until significant rehabilitation Paroxysmal Afib -Con't amio; hold DOAC - daily lasix -tele monitoring  Hyperbilirubinemia; no evidence of occlusive gallstones causing ductal dilation ARDS quadrant ultrasound, previously has had cholecystectomy - Monitor  Anxiety  -ativan as needed; decreased back again to q8h PRN - con't seroquel and sertraline -con't gabapentin for coughing; if too sedating can just use at bedtime.  Remus Loffler at bedtime -need to keep him awake today  LUE provoked DVT from picc line -holding DOAC  Rash on knee-- looks like ringworm -contact precautions -topical antifungal x 2 weeks total  Petechial rash on hands and feet; blood cultures and serologies negative so far -Appreciate ID's input; workup unrevealing so far. His wife asked me today if this was possibly endocarditis-- I informed her that the blood culture have been negative and he otherwise does not appear to have an infection -Follow blood cultures until finalized  Thrombocytopenia- unclear etiology. Not on antibiotics. With PPI low risk. -monitor  Mild hyperglycemia - SSI PRN - goal BG 140-180  Unstagable pressure ulcer on sacrum POA -frequent turns -optimize nutrition, con't TF -wound care  Moderate protein energy malnutrition  -TF  Severe deconditioning  -PT, OT  Wife updated at bedside this morning during rounds. Her main concern is him being discharged prematurely. I  assured her I have no plans of discharging him currently.  Best practice:  Diet: TF Pain/Anxiety/Delirium protocol (if indicated): N/A VAP protocol (if indicated): In place DVT prophylaxis: SCD GI prophylaxis: PPI Glucose control: N/A- CBGs in 90s Mobility: turn, progress as tolerated Sacral wound stage 4+, POA Code Status: full  Family Communication: wife updated at bedside 12/16 Disposition: Remains in ICU due to vent dependence   This patient is critically ill with multiple organ system failure which requires frequent high complexity decision making, assessment, support, evaluation, and titration of therapies. This was completed through the application of advanced monitoring technologies and extensive interpretation of multiple databases. During this encounter critical care time was devoted to patient care services described in this note for 41 minutes.  Steffanie Dunn, DO 08/19/23 8:44 AM [facility]  For contact information, see Amion. If no response to pager, please call PCCM consult pager. After hours, 7PM- 7AM, please call Elink.

## 2023-08-19 NOTE — Progress Notes (Signed)
[facility]  Date of Admission:  08/13/2023     Lines:  Right internal jugular central line 12/14-c   Abx: None this admission                                                      Assessment: hx dvt on [facility] that was recently stopped due to tracheal site bleeding, chronic tracheostomy and peg, pAfib, hx right hemicolectomy for colon cancer, recent off and on abx for pna and been back and forth at [facility] as well, admitted 12/13 for gib. Id consulted for rash     From id standpoint he has had various antibiotics recently within the past few weeks prior to this admission. The clinical appearance/hx most suggestive of a drug/abx related rash that could be improving   Of the rash distribution and exposure hx, it is unlikely tick related. Atypical for a viral process such as hands/foot mouth, syphilis, reactive arthritis, embolic process from endocarditis. And for someone looking this well, I supposed acquired complement deficiency with menincoccal septicemia could appear like this - although would expect fever    Labs doesn't suggest dic/post-transfusion associated purpura (discussed with pulm/ccm dr Chestine Spore)   ?other small vessel vasculitis ?what causes   ------------- 08/17/23 id assessment Rash for most part stable. Maybe slight improvement in palms/sole and stable at the dorsum of bilateral ankle joint area Platelet, renal function, rbc/wbc, lft's stable  Hiv, syphilis screen, respiratory viral pcr negative; urine gc/chlam in progress; blood cx ngtd  Other thing that continues to remain on ddx include in order of what I think --> recent abx side effect related rash, vasculitis, infection associated (mycoplasma, handfoot mouth, meningococcal with complement deficiency, reactive arthritis, syphilis but all so far outside of meningococcal had turned out negative)  Consider HSP iga vasculitis spectrum   ------------ 08/19/23 id  assessment Legs petechial palpable nonblanching rash stable in the dorsum feet/ankle area. Renal function remains stable/normal  All id serology w/u negative  What remains that could be would be allergic rxn to recent abx, or primary vasculitis. Given stability and minimal involvement, not sure need to biopsy   Plan: Defer biopsy and timing of it if needed to primary team No infectious cause appears to be in play Id will sign off Discussed with dr Karie Fetch      Principal Problem:   Acute GI bleeding Active Problems:   Severe mitral regurgitation   AKI (acute kidney injury) (HCC)   On mechanically assisted ventilation (HCC)   Tracheostomy dependence (HCC)   Moderate aortic insufficiency   Acute respiratory failure with hypoxia (HCC)   Malnutrition of moderate degree   Tracheostomy in place (HCC)   PEG (percutaneous endoscopic gastrostomy) status (HCC)   Sacral ulcer (HCC)   Paroxysmal atrial fibrillation (HCC)   DVT (deep venous thrombosis) (HCC)   HTN (hypertension)   Rash   Upper GI bleed   Abnormal finding on GI tract imaging   Acute post-hemorrhagic anemia   Allergies  Allergen Reactions   Codeine     "sends me into orbit"    Scheduled Meds:  amiodarone  200 mg Per Tube Daily   Chlorhexidine Gluconate Cloth  6 each Topical Q2000   docusate  100 mg Per Tube BID  free water  150 mL Per Tube Q4H   furosemide  40 mg Per Tube Daily   gabapentin  100 mg Per Tube Q8H   insulin aspart  0-15 Units Subcutaneous Q4H   leptospermum manuka honey  1 Application Topical Daily   miconazole   Topical BID   nutrition supplement (JUVEN)  1 packet Per Tube BID BM   mouth rinse  15 mL Mouth Rinse Q2H   pantoprazole (PROTONIX) IV  40 mg Intravenous Q12H   polyethylene glycol  17 g Per Tube Daily   QUEtiapine  50 mg Per Tube TID   sertraline  100 mg Per Tube Daily   tranexamic acid  500 mg Nebulization Q8H   Continuous Infusions:  feeding supplement (OSMOLITE 1.5 CAL)  60 mL/hr at 08/19/23 0600   promethazine (PHENERGAN) injection (IM or IVPB)     PRN Meds:.acetaminophen, ipratropium-albuterol, LORazepam, ondansetron (ZOFRAN) IV, mouth rinse, oxyCODONE, phenol, promethazine (PHENERGAN) injection (IM or IVPB), zolpidem   SUBJECTIVE: Rash stable No other event over night outside of agitation   Review of Systems: ROS All other ROS was negative, except mentioned above     OBJECTIVE: Vitals:   08/19/23 1100 08/19/23 1104 08/19/23 1129 08/19/23 1200  BP: 114/76   (!) 121/57  Pulse: 91   81  Resp: (!) 31   (!) 24  Temp:  98.8 F (37.1 C)    TempSrc:  Axillary    SpO2: 94%  93% 95%  Weight:      Height:       Body mass index is 25.19 kg/m.  Physical Exam General/constitutional: sleeping; wife at bedside HEENT: Normocephalic Neck: trach site no bleeding CV: rrr no mrg Lungs: normal respiratory effort -- minimal vent setting Abd: Soft Skin: no change in petechiael rash bilateral ankle joint mostly dorsal/lateral. Palm/soles faint erythematous macular rash also stable/fainting away    Lab Results Lab Results  Component Value Date   WBC 11.1 (H) 08/19/2023   HGB 8.0 (L) 08/19/2023   HCT 25.7 (L) 08/19/2023   MCV 98.1 08/19/2023   PLT 105 (L) 08/19/2023    Lab Results  Component Value Date   CREATININE 0.87 08/19/2023   BUN 23 08/19/2023   NA 141 08/19/2023   K 4.1 08/19/2023   CL 108 08/19/2023   CO2 29 08/19/2023    Lab Results  Component Value Date   ALT 12 08/19/2023   AST 18 08/19/2023   ALKPHOS 46 08/19/2023   BILITOT 1.5 (H) 08/19/2023      Microbiology: Recent Results (from the past 240 hours)  Culture, Respiratory w Gram Stain     Status: None   Collection Time: 08/12/23 10:32 AM   Specimen: Tracheal Aspirate; Respiratory  Result Value Ref Range Status   Specimen Description TRACHEAL ASPIRATE  Final   Special Requests NONE  Final   Gram Stain   Final    ABUNDANT WBC PRESENT, PREDOMINANTLY PMN RARE GRAM  POSITIVE RODS RARE GRAM POSITIVE COCCI IN PAIRS    Culture   Final    MODERATE METHICILLIN RESISTANT STAPHYLOCOCCUS AUREUS MODERATE CORYNEBACTERIUM STRIATUM Standardized susceptibility testing for this organism is not available. Performed at [facility], [address] [place], [place] [address]    Report Status 08/16/2023 FINAL  Final   Organism ID, Bacteria METHICILLIN RESISTANT STAPHYLOCOCCUS AUREUS  Final      Susceptibility   Methicillin resistant staphylococcus aureus - MIC*    CIPROFLOXACIN >=8 RESISTANT Resistant     ERYTHROMYCIN >=  8 RESISTANT Resistant     GENTAMICIN <=0.5 SENSITIVE Sensitive     OXACILLIN >=4 RESISTANT Resistant     TETRACYCLINE <=1 SENSITIVE Sensitive     VANCOMYCIN 1 SENSITIVE Sensitive     TRIMETH/SULFA <=10 SENSITIVE Sensitive     CLINDAMYCIN <=0.25 SENSITIVE Sensitive     RIFAMPIN <=0.5 SENSITIVE Sensitive     Inducible Clindamycin NEGATIVE Sensitive     LINEZOLID 2 SENSITIVE Sensitive     * MODERATE METHICILLIN RESISTANT STAPHYLOCOCCUS AUREUS  MRSA Next Gen by PCR, Nasal     Status: Abnormal   Collection Time: 08/14/23  9:48 AM   Specimen: Nasal Mucosa; Nasal Swab  Result Value Ref Range Status   MRSA by PCR Next Gen DETECTED (A) NOT DETECTED Final    Comment: RESULT CALLED TO, READ BACK BY AND VERIFIED WITH: RN Rubin Payor on [phone] @1329H  by SM (NOTE) The GeneXpert MRSA Assay (FDA approved for NASAL specimens only), is one component of a comprehensive MRSA colonization surveillance program. It is not intended to diagnose MRSA infection nor to guide or monitor treatment for MRSA infections. Test performance is not FDA approved in patients less than 67 years old. Performed at [facility], [address] [place], [place] [address]   Culture, Respiratory w Gram Stain     Status: None   Collection Time: 08/15/23 10:02 AM   Specimen: Tracheal Aspirate; Respiratory  Result Value Ref Range Status   Specimen Description TRACHEAL  ASPIRATE  Final   Special Requests NONE  Final   Gram Stain   Final    RARE SQUAMOUS EPITHELIAL CELLS PRESENT FEW WBC PRESENT,BOTH PMN AND MONONUCLEAR RARE GRAM POSITIVE COCCI Performed at [facility], [address] [place], [place] [address]    Culture RARE METHICILLIN RESISTANT STAPHYLOCOCCUS AUREUS  Final   Report Status 08/18/2023 FINAL  Final   Organism ID, Bacteria METHICILLIN RESISTANT STAPHYLOCOCCUS AUREUS  Final      Susceptibility   Methicillin resistant staphylococcus aureus - MIC*    CIPROFLOXACIN >=8 RESISTANT Resistant     ERYTHROMYCIN >=8 RESISTANT Resistant     GENTAMICIN <=0.5 SENSITIVE Sensitive     OXACILLIN >=4 RESISTANT Resistant     TETRACYCLINE <=1 SENSITIVE Sensitive     VANCOMYCIN 1 SENSITIVE Sensitive     TRIMETH/SULFA <=10 SENSITIVE Sensitive     CLINDAMYCIN <=0.25 SENSITIVE Sensitive     RIFAMPIN <=0.5 SENSITIVE Sensitive     Inducible Clindamycin NEGATIVE Sensitive     LINEZOLID 2 SENSITIVE Sensitive     * RARE METHICILLIN RESISTANT STAPHYLOCOCCUS AUREUS  Culture, blood (Routine X 2) w Reflex to ID Panel     Status: None (Preliminary result)   Collection Time: 08/16/23  3:21 PM   Specimen: BLOOD RIGHT HAND  Result Value Ref Range Status   Specimen Description BLOOD RIGHT HAND  Final   Special Requests   Final    BOTTLES DRAWN AEROBIC AND ANAEROBIC Blood Culture adequate volume   Culture   Final    NO GROWTH 3 DAYS Performed at [facility], [address] [place], [place] [address]    Report Status PENDING  Incomplete  Respiratory (~20 pathogens) panel by PCR     Status: None   Collection Time: 08/16/23  3:34 PM   Specimen: Nasopharyngeal Swab; Respiratory  Result Value Ref Range Status   Adenovirus NOT DETECTED NOT DETECTED Final   Coronavirus 229E NOT DETECTED NOT DETECTED Final    Comment: (NOTE) The Coronavirus  on the Respiratory Panel, DOES NOT test for the novel  Coronavirus (2019 nCoV)    Coronavirus HKU1 NOT DETECTED  NOT DETECTED Final   Coronavirus NL63 NOT DETECTED NOT DETECTED Final   Coronavirus OC43 NOT DETECTED NOT DETECTED Final   Metapneumovirus NOT DETECTED NOT DETECTED Final   Rhinovirus / Enterovirus NOT DETECTED NOT DETECTED Final   Influenza A NOT DETECTED NOT DETECTED Final   Influenza B NOT DETECTED NOT DETECTED Final   Parainfluenza Virus 1 NOT DETECTED NOT DETECTED Final   Parainfluenza Virus 2 NOT DETECTED NOT DETECTED Final   Parainfluenza Virus 3 NOT DETECTED NOT DETECTED Final   Parainfluenza Virus 4 NOT DETECTED NOT DETECTED Final   Respiratory Syncytial Virus NOT DETECTED NOT DETECTED Final   Bordetella pertussis NOT DETECTED NOT DETECTED Final   Bordetella Parapertussis NOT DETECTED NOT DETECTED Final   Chlamydophila pneumoniae NOT DETECTED NOT DETECTED Final   Mycoplasma pneumoniae NOT DETECTED NOT DETECTED Final    Comment: Performed at [facility], [address] [place], [place] [address]  Culture, blood (Routine X 2) w Reflex to ID Panel     Status: None (Preliminary result)   Collection Time: 08/16/23  5:28 PM   Specimen: BLOOD RIGHT HAND  Result Value Ref Range Status   Specimen Description BLOOD RIGHT HAND  Final   Special Requests   Final    BOTTLES DRAWN AEROBIC AND ANAEROBIC Blood Culture results may not be optimal due to an inadequate volume of blood received in culture bottles   Culture   Final    NO GROWTH 3 DAYS Performed at [facility], [address], [place], [place] [address]    Report Status PENDING  Incomplete     Serology:   Imaging: If present, new imagings (plain films, ct scans, and mri) have been personally visualized and interpreted; radiology reports have been reviewed. Decision making incorporated into the Impression / Recommendations.   Raymondo Band, MD [facility] [phone] pager    08/19/2023, 1:19 PM

## 2023-08-19 NOTE — Progress Notes (Signed)
12/19 0630 Unable to get bed weight x 2 days due to "weight too low" error message, suspect that the bed was zeroed with patient in it. Reported this to day shift RN 12/19 in nursing handoff.   12/19 2300 Bed not zeroed during previous shift. Attempted to lift pt up with maxi lift to zero bed, lift not working. Consulting civil engineer notified. Will inform day shift RN again, if lift not working, will suggest that the bed be zeroed when pt stands with PT again.

## 2023-08-19 NOTE — Progress Notes (Signed)
Physical Therapy Treatment Patient Details Name: Joshua Eaton MRN: [id number] DOB: 23-Jul-1956 Today's Date: 08/19/2023   History of Present Illness 67 y.o. male presents to [facility] hospital on 08/13/23 from [facility] with GI bleed. Pt also has red rash on his feet, left knee, and hands (work up underway for cause). LUE DVT from anticubital space to wrist. PMHx: colon CA s/p Rt hemicolectomy, MVR, COPD, PAF, LUE DVT, respiratory failure with trach, severe MR, PEG    PT Comments  Tolerated activities well today. Progressing bed mobility and transfer training. Required mod A, up to Max A +2 with various tasks including Max A +2 to stand EOB. Also performed squat and lateral scoot along bed. Tolerated sitting EOB, working on trunk control and upright posture. VSS throughout session. Complains of buttock pain. Patient will continue to benefit from skilled physical therapy services to further improve independence with functional mobility.     If plan is discharge home, recommend the following: Two people to help with walking and/or transfers;Two people to help with bathing/dressing/bathroom;Assistance with cooking/housework;Assist for transportation   Can travel by private vehicle        Equipment Recommendations  None recommended by PT    Recommendations for Other Services       Precautions / Restrictions Precautions Precautions: Fall Precaution Comments: trach collar, vent,  PEG, sacral wound, left dvt from wrist to anticubital space Restrictions Weight Bearing Restrictions Per Provider Order: No     Mobility  Bed Mobility Overal bed mobility: Needs Assistance Bed Mobility: Supine to Sit, Sit to Supine, Rolling Rolling: Min assist, Used rails   Supine to sit: Mod assist, +2 for physical assistance, HOB elevated Sit to supine: Max assist, +2 for physical assistance, +2 for safety/equipment, HOB elevated   General bed mobility comments: Min assist to roll towards left side, guidance for  hand to grasp rail, modified transition from semi-sidelying to sit using bed pad helicopter technique Mod A +2 for trunk support and LEs off bed. Max assist for LEs and trunk, helicopter technique again with +2 assist to reduce friction on sacral wound.    Transfers Overall transfer level: Needs assistance Equipment used: 2 person hand held assist Transfers: Sit to/from Stand Sit to Stand: +2 physical assistance, Max assist           General transfer comment: Max A +2 for boost and balance to rise from EOB, using bed pad to thrust hips forward for more upright stance. Tolerated approx 10 seconds without LE buckling. Performed partial stand with lateral scoot along bed to reposition with max A +2. VC throughout for technique.    Ambulation/Gait                   Stairs             Wheelchair Mobility     Tilt Bed    Modified Rankin (Stroke Patients Only)       Balance Overall balance assessment: Needs assistance Sitting-balance support: Single extremity supported, Feet supported Sitting balance-Leahy Scale: Poor Sitting balance - Comments: Initially pushing heavily towards Rt due to sacral wound, repositioned to lean Lt to avoid pulling on trach/vent. Progressed to CGA on EOB. trunk flexed. Postural control: Right lateral lean Standing balance support: Bilateral upper extremity supported, During functional activity, Reliant on assistive device for balance Standing balance-Leahy Scale: Poor Standing balance comment: Stands without buckling briefly, +2 support, both hands.  Cognition Arousal: Lethargic, Alert (became more alert with mobility. Initially difficult to awaken) Behavior During Therapy: Flat affect Overall Cognitive Status: Difficult to assess                                 General Comments: Following commands majority of time.        Exercises      General Comments General comments (skin  integrity, edema, etc.): Spo2 96%+ on 40% FiO2 and PEEP 5. HR 72, RR 20, BP 103/52.      Pertinent Vitals/Pain Pain Assessment Pain Assessment: CPOT Facial Expression: Relaxed, neutral Body Movements: Protection Muscle Tension: Relaxed Compliance with ventilator (intubated pts.): Tolerating ventilator or movement Vocalization (extubated pts.): N/A CPOT Total: 1 Pain Location: Buttocks when sitting EOB. Pain Descriptors / Indicators: Grimacing Pain Intervention(s): Monitored during session, Repositioned, Limited activity within patient's tolerance    Home Living                          Prior Function            PT Goals (current goals can now be found in the care plan section) Acute Rehab PT Goals Patient Stated Goal: n/a PT Goal Formulation: Patient unable to participate in goal setting Time For Goal Achievement: 08/30/23 Potential to Achieve Goals: Fair Progress towards PT goals: Progressing toward goals    Frequency    Min 1X/week      PT Plan      Co-evaluation              AM-PAC PT "6 Clicks" Mobility   Outcome Measure  Help needed turning from your back to your side while in a flat bed without using bedrails?: A Lot Help needed moving from lying on your back to sitting on the side of a flat bed without using bedrails?: Total Help needed moving to and from a bed to a chair (including a wheelchair)?: Total Help needed standing up from a chair using your arms (e.g., wheelchair or bedside chair)?: Total Help needed to walk in hospital room?: Total Help needed climbing 3-5 steps with a railing? : Total 6 Click Score: 7    End of Session Equipment Utilized During Treatment: Oxygen;Gait belt Activity Tolerance: Patient tolerated treatment well Patient left: in bed;with call bell/phone within reach;with family/visitor present Nurse Communication: Mobility status;Need for lift equipment PT Visit Diagnosis: Muscle weakness (generalized)  (M62.81);Unsteadiness on feet (R26.81);Difficulty in walking, not elsewhere classified (R26.2);Pain Pain - part of body:  (sacral wound)     Time: 1610-9604 PT Time Calculation (min) (ACUTE ONLY): 24 min  Charges:    $Therapeutic Activity: 23-37 mins PT General Charges $$ ACUTE PT VISIT: 1 Visit                     Kathlyn Sacramento, PT, DPT [facility]  Rehabilitation Services Physical Therapist Office: [phone] Website: [email or web address]    Berton Mount 08/19/2023, 3:40 PM

## 2023-08-20 DIAGNOSIS — K922 Gastrointestinal hemorrhage, unspecified: Secondary | ICD-10-CM | POA: Diagnosis not present

## 2023-08-20 LAB — BASIC METABOLIC PANEL
Anion gap: 3 — ABNORMAL LOW (ref 5–15)
BUN: 30 mg/dL — ABNORMAL HIGH (ref 8–23)
CO2: 29 mmol/L (ref 22–32)
Calcium: 8.3 mg/dL — ABNORMAL LOW (ref 8.9–10.3)
Chloride: 109 mmol/L (ref 98–111)
Creatinine, Ser: 1.28 mg/dL — ABNORMAL HIGH (ref 0.61–1.24)
GFR, Estimated: >60 mL/min (ref >60)
Glucose, Bld: 126 mg/dL — ABNORMAL HIGH (ref 70–99)
Potassium: 4.2 mmol/L (ref 3.5–5.1)
Sodium: 141 mmol/L (ref 135–145)

## 2023-08-20 LAB — CBC WITH DIFFERENTIAL/PLATELET
Abs Immature Granulocytes: 0.09 10*3/uL — ABNORMAL HIGH (ref 0.00–0.07)
Basophils Absolute: 0 10*3/uL (ref 0.0–0.1)
Basophils Relative: 0 %
Eosinophils Absolute: 0.5 10*3/uL (ref 0.0–0.5)
Eosinophils Relative: 5 %
HCT: 23.8 % — ABNORMAL LOW (ref 39.0–52.0)
Hemoglobin: 7.6 g/dL — ABNORMAL LOW (ref 13.0–17.0)
Immature Granulocytes: 1 %
Lymphocytes Relative: 11 %
Lymphs Abs: 1.1 10*3/uL (ref 0.7–4.0)
MCH: 31 pg (ref 26.0–34.0)
MCHC: 31.9 g/dL (ref 30.0–36.0)
MCV: 97.1 fL (ref 80.0–100.0)
Monocytes Absolute: 0.5 10*3/uL (ref 0.1–1.0)
Monocytes Relative: 5 %
Neutro Abs: 7.9 10*3/uL — ABNORMAL HIGH (ref 1.7–7.7)
Neutrophils Relative %: 78 %
Platelets: 114 10*3/uL — ABNORMAL LOW (ref 150–400)
RBC: 2.45 MIL/uL — ABNORMAL LOW (ref 4.22–5.81)
RDW: 16.8 % — ABNORMAL HIGH (ref 11.5–15.5)
WBC: 10.1 10*3/uL (ref 4.0–10.5)
nRBC: 0 % (ref 0.0–0.2)

## 2023-08-20 LAB — GLUCOSE, CAPILLARY
Glucose-Capillary: 111 mg/dL — ABNORMAL HIGH (ref 70–99)
Glucose-Capillary: 104 mg/dL — ABNORMAL HIGH (ref 70–99)
Glucose-Capillary: 100 mg/dL — ABNORMAL HIGH (ref 70–99)
Glucose-Capillary: 132 mg/dL — ABNORMAL HIGH (ref 70–99)
Glucose-Capillary: 142 mg/dL — ABNORMAL HIGH (ref 70–99)
Glucose-Capillary: 109 mg/dL — ABNORMAL HIGH (ref 70–99)

## 2023-08-20 NOTE — Progress Notes (Signed)
NAME:  Joshua Eaton, MRN:  [id number], DOB:  1956-01-24, LOS: 6 ADMISSION DATE:  08/13/2023, CONSULTATION DATE:  12/13 REFERRING MD:  Dr. Eloise Harman, CHIEF COMPLAINT:  GIB   History of Present Illness:  Patient is a 67 yo M w/ pertinent PMH chronic respiratory failure w/ tracheostomy/[facility] dependent from [facility], Afib on eliquis presents to [facility] ED on 12/13 w/ GIB.  Patient recently admitted w/ respiratory failure and pna in October 2024 requiring tracheostomy. He was later transferred to [facility] on 10/28. Later that day had an accidental trach dislodgement. His wob continued to increase requiring re-admission to [facility] on 10/31 and was intubated and later trached during hospital admission. Patient discharged on 11/12 [facility] to [facility].   On 12/13 patient admitted to [facility] w/ drop in hgb and concern for GIB. GI consulted for possible endoscopy. TRH to admit and PCCM to consult for vent management.  Pertinent  Medical History   Past Medical History:  Diagnosis Date   Anxiety    Cancer (HCC)    Depression    Hypertension    OCD (obsessive compulsive disorder)    Significant Hospital Events: Including procedures, antibiotic start and stop dates in addition to other pertinent events   12/13 admitted w/ GIB. Pccm to consult for vent management 12/14 1 unit pRBC 12/15 EGD - erosion around PEG site but no active bleeding; cratered ulcer biopsied near peg site.  12/16 plugging with clots- posterior wall bleeding from distal end of trach  Interim History / Subjective:  + 2.8 L I/O Afebrile Alert and interactive this morning, denies complaints of pain. Remains on mechanical ventilation  Objective   Blood pressure 118/86, pulse 85, temperature 99.1 F (37.3 C), temperature source Oral, resp. rate (!) 24, height 6' 0.99" (1.854 m), weight 86.6 kg, SpO2 98%.    Vent Mode: PSV;CPAP FiO2 (%):  [40 %] 40 % PEEP:  [5 cmH20] 5 cmH20 Pressure Support:  [12 cmH20] 12 cmH20    Intake/Output Summary (Last 24 hours) at 08/20/2023 0848 Last data filed at 08/20/2023 4034 Gross per 24 hour  Intake 1620 ml  Output 1340 ml  Net 280 ml   Filed Weights   08/14/23 0453  Weight: 86.6 kg    Examination: General: Adult male, alert and interactive on mechanical ventilation  HEENT:  [place]/AT, eyes anicteric  Neck: trach in place, midline Neuro: Alert, attempted to verbalize by mouthing words, moving extremities.  CV: S1S2, RRR PULM:  light bloody secretions from trach, no rhonchi today. CTAB. GI: soft, NT, non distended.  Extremities: No cyanosis or edema. Skin: rash on knees improving, feet not worse, but rash is still bright red.   Resolved Hospital Problem list   Hypokalemia HTN AKI  Assessment & Plan:  ABLA 2/2 UGIB/ duodenitis, ongoing slow trend down in Hb- CTA showed active bleeding from gastric antrum/ pylorus with duodenitis. Multiple transfusions initially, 1 unit since admission. -gastric biopsy negative -con't high dose PPI indefinitely per GI, especially since he will eventually need to re-trial DOAC. Home apixaban held -transfuse for Hb <7 or hemodynamically significant bleeding  Elevated d-dimer despite treatment for DVT with [facility] for the past few months -high risk for recurrent clots; con't holding apixaban until hemoptysis resolves  Acute kidney injury -strict I/O -renally dose meds, avoid nephrotoxic meds.  -Overall improving, slight increase in creatinine this am.  -Hold lasix dosing today, check bladder scans to ensure no urinary retention  Chronic respiratory failure Trach dependent Hemoptysis secondary to irritation  of his trach on the posterior tracheal wall - Baseline: Up to 20h TC at [facility], family thinks panic attacks have limited vent weaning. Has been deemed chronic vent dependent. Continue to wean as tolerated.  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  Remus Loffler at bedtime  LUE provoked DVT from picc line -holding DOAC  Rash on knee-- looks like ringworm Infectious disease following -contact precautions -topical antifungal x 2 weeks total   Petechial rash on hands and feet; blood cultures and serologies negative so far -Appreciate ID's input; workup unrevealing so far. ID team feels clinical appearance most suggestive of drug/abx related rash.  -Blood cultures remain negative  Thrombocytopenia- unclear etiology. Not on antibiotics. With PPI low risk. -monitor  Mild hyperglycemia - SSI PRN - goal BG 140-180  Unstagable pressure ulcer on sacrum POA -frequent turns -optimize nutrition, con't TF -wound care  Moderate protein energy malnutrition  -TF  Severe deconditioning  -PT, OT  Diarrhea -- Discontinue stool softeners/bowel reg  Best practice:  Diet: TF Pain/Anxiety/Delirium protocol (if indicated): N/A VAP protocol (if indicated): In place DVT prophylaxis: SCD GI prophylaxis: PPI Glucose control: N/A Mobility: turn, progress as tolerated Sacral wound stage 4+, POA Code Status: full  Family Communication: wife updated at bedside  Disposition: Remains in ICU due to vent dependence   This patient is critically ill with multiple organ system failure which requires frequent high complexity decision making, assessment, support, evaluation, and titration of therapies. This was completed through the  application of advanced monitoring technologies and extensive interpretation of multiple databases. During this encounter critical care time was devoted to patient care services described in this note for 36  minutes.  Cindra Eves, ACNP 08/20/23 8:48 AM [facility]  For contact information, see Amion. If no response to pager, please call PCCM consult pager. After hours, 7PM- 7AM, please call Elink.

## 2023-08-20 NOTE — Telephone Encounter (Signed)
I called on 12/20; pt is in the hospital.  I advised them to call back for follow up appt when he is out.

## 2023-08-20 NOTE — Progress Notes (Signed)
SLP Cancellation Note  Patient Details Name: Yaphet Melnyk MRN: [id number] DOB: 27-Aug-1956   Cancelled treatment:       Reason Eval/Treat Not Completed: Patient not medically ready. SLP continues to follow. Note that pt is currently on the vent. Will f/u for PMV as able.     Mahala Menghini., M.A. CCC-SLP Acute Rehabilitation Services Office [phone]  Secure chat preferred  08/20/2023, 11:21 AM

## 2023-08-20 NOTE — Progress Notes (Signed)
Physical Therapy Wound Treatment Patient Details  Name: Joshua Eaton MRN: [id number] Date of Birth: 12-08-1955  Today's Date: 08/20/2023 Time: 0902-0952 Time Calculation (min): 50 min  Subjective  Subjective Assessment Subjective: Trach on vent; nods yes/no and tries to mouth words but difficult to understand Patient and Family Stated Goals: wife present and wants it to heal without need for further surgery Date of Onset:  (unknown) Prior Treatments: per wife, plastics did debridement last week; NS wet to dry dressings  Pain Score:  Pt appeared to have mild pain with wound therapy, unable to verbalize amount of pain due to having trach on vent, RN provided pt with IV pain meds at start of session  Wound Assessment  Pressure Injury 08/14/23 Sacrum Posterior;Medial Unstageable - Full thickness tissue loss in which the base of the injury is covered by slough (yellow, tan, gray, green or brown) and/or eschar (tan, brown or black) in the wound bed. (Active)  Wound Image  08/16/23 1124  Dressing Type Barrier Film (skin prep);Honey;Gauze (Comment);Foam - Lift dressing to assess site every shift;Normal saline moist dressing 08/20/23 1001  Dressing Clean, Dry, Intact;Changed 08/20/23 1001  Dressing Change Frequency Daily 08/20/23 1001  State of Healing Early/partial granulation 08/20/23 1001  Site / Wound Assessment Bleeding;Granulation tissue;Red;Yellow 08/20/23 1001  % Wound base Red or Granulating 75% 08/20/23 1001  % Wound base Yellow/Fibrinous Exudate 25% 08/20/23 1001  % Wound base Black/Eschar 0% 08/20/23 1001  % Wound base Other/Granulation Tissue (Comment) 0% 08/20/23 1001  Peri-wound Assessment Pink;Maceration 08/20/23 1001  Wound Length (cm) 4.7 cm 08/16/23 1124  Wound Width (cm) 4.2 cm 08/16/23 1124  Wound Depth (cm) 3.1 cm 08/16/23 1124  Wound Surface Area (cm^2) 19.74 cm^2 08/16/23 1124  Wound Volume (cm^3) 61.19 cm^3 08/16/23 1124  Margins Unattached edges (unapproximated)  08/20/23 1001  Drainage Amount Moderate 08/20/23 1001  Drainage Description Purulent;Sanguineous;Odor - foul 08/20/23 1001  Treatment Cleansed;Debridement (Selective);Irrigation;Off loading;Packing (Saline gauze) 08/20/23 1001   Selective Debridement (non-excisional) Selective Debridement (non-excisional) - Location: sacrum Selective Debridement (non-excisional) - Tools Used: Forceps, Scalpel, Scissors Selective Debridement (non-excisional) - Tissue Removed: yellow slough    Wound Assessment and Plan  Wound Therapy - Assess/Plan/Recommendations Wound Therapy - Clinical Statement: The pt's wound is gradually improving, but there continues to be yellow slough. Some of the slough was very loose and came off with the old dressing, but there was also adherent yellow tissue that was shallow and difficult to remove as attempts to further debride resulted in bleeding. Pt also coughed a lot this session, resulting in him moving a lot and impacting the ease and safety at which to perform selective debridement. Patient will benefit from further wound therapy to remove necrotic tissue, decrease bioburden, and promote healing. Depending on how the wound looks next week, he may only need x1 additional wound therapy session. Wound Therapy - Functional Problem List: weak with limited mobility Factors Delaying/Impairing Wound Healing: Incontinence, Immobility, Multiple medical problems Hydrotherapy Plan: Debridement, Dressing change, Other (comment) (irrigation) Wound Therapy - Frequency: 2X / week (Tues, Friday) Wound Therapy - Follow Up Recommendations: dressing changes by RN  Wound Therapy Goals- Improve the function of patient's integumentary system by progressing the wound(s) through the phases of wound healing (inflammation - proliferation - remodeling) by: Wound Therapy Goals - Improve the function of patient's integumentary system by progressing the wound(s) through the phases of wound healing  by: Decrease Necrotic Tissue to: 15% Decrease Necrotic Tissue - Progress: Progressing toward goal Increase Granulation Tissue to:  85% Increase Granulation Tissue - Progress: Progressing toward goal Decrease Length/Width/Depth by (cm): --/--/0.3 Decrease Length/Width/Depth - Progress: Progressing toward goal Improve Drainage Characteristics: Min Improve Drainage Characteristics - Progress: Progressing toward goal Goals/treatment plan/discharge plan were made with and agreed upon by patient/family: Yes Time For Goal Achievement: 7 days Wound Therapy - Potential for Goals: Good  Goals will be updated until maximal potential achieved or discharge criteria met.  Discharge criteria: when goals achieved, discharge from hospital, MD decision/surgical intervention, no progress towards goals, refusal/missing three consecutive treatments without notification or medical reason.  GP     Charges PT Wound Care Charges $Wound Debridement up to 20 cm: < or equal to 20 cm $PT Hydrotherapy Dressing: 2 dressings $PT Hydrotherapy Visit: 1 Visit     Virgil Benedict, PT, DPT Acute Rehabilitation Services  Office: [phone]   Bettina Gavia 08/20/2023, 10:09 AM

## 2023-08-21 DIAGNOSIS — J9601 Acute respiratory failure with hypoxia: Secondary | ICD-10-CM | POA: Diagnosis not present

## 2023-08-21 DIAGNOSIS — D62 Acute posthemorrhagic anemia: Secondary | ICD-10-CM | POA: Diagnosis not present

## 2023-08-21 DIAGNOSIS — K922 Gastrointestinal hemorrhage, unspecified: Secondary | ICD-10-CM | POA: Diagnosis not present

## 2023-08-21 DIAGNOSIS — Z930 Tracheostomy status: Secondary | ICD-10-CM | POA: Diagnosis not present

## 2023-08-21 LAB — CULTURE, BLOOD (ROUTINE X 2)
Culture: NO GROWTH
Culture: NO GROWTH
Special Requests: ADEQUATE

## 2023-08-21 LAB — BASIC METABOLIC PANEL
Anion gap: 8 (ref 5–15)
BUN: 31 mg/dL — ABNORMAL HIGH (ref 8–23)
CO2: 26 mmol/L (ref 22–32)
Calcium: 8.2 mg/dL — ABNORMAL LOW (ref 8.9–10.3)
Chloride: 104 mmol/L (ref 98–111)
Creatinine, Ser: 0.87 mg/dL (ref 0.61–1.24)
GFR, Estimated: >60 mL/min (ref >60)
Glucose, Bld: 124 mg/dL — ABNORMAL HIGH (ref 70–99)
Potassium: 4.2 mmol/L (ref 3.5–5.1)
Sodium: 138 mmol/L (ref 135–145)

## 2023-08-21 LAB — CBC
HCT: 23.3 % — ABNORMAL LOW (ref 39.0–52.0)
Hemoglobin: 7.2 g/dL — ABNORMAL LOW (ref 13.0–17.0)
MCH: 30.6 pg (ref 26.0–34.0)
MCHC: 30.9 g/dL (ref 30.0–36.0)
MCV: 99.1 fL (ref 80.0–100.0)
Platelets: 109 10*3/uL — ABNORMAL LOW (ref 150–400)
RBC: 2.35 MIL/uL — ABNORMAL LOW (ref 4.22–5.81)
RDW: 16.9 % — ABNORMAL HIGH (ref 11.5–15.5)
WBC: 9.5 10*3/uL (ref 4.0–10.5)
nRBC: 0 % (ref 0.0–0.2)

## 2023-08-21 LAB — GLUCOSE, CAPILLARY
Glucose-Capillary: 133 mg/dL — ABNORMAL HIGH (ref 70–99)
Glucose-Capillary: 132 mg/dL — ABNORMAL HIGH (ref 70–99)
Glucose-Capillary: 122 mg/dL — ABNORMAL HIGH (ref 70–99)
Glucose-Capillary: 113 mg/dL — ABNORMAL HIGH (ref 70–99)
Glucose-Capillary: 106 mg/dL — ABNORMAL HIGH (ref 70–99)
Glucose-Capillary: 152 mg/dL — ABNORMAL HIGH (ref 70–99)

## 2023-08-21 MED ORDER — CARMEX CLASSIC LIP BALM EX OINT
TOPICAL_OINTMENT | CUTANEOUS | Status: DC | PRN
  Filled 2023-08-21: qty 10

## 2023-08-21 NOTE — Hospital Course (Signed)
Joshua Eaton is a 67 yo M w/ pertinent PMH chronic respiratory failure w/ tracheostomy/vent dependent from [facility], Afib on eliquis presented to [facility] ED on 12/13 w/ GIB.   Patient recently admitted w/ respiratory failure and pna in October 2024 requiring tracheostomy. He was later transferred to [facility] on 10/28. Later that day had an accidental trach dislodgement. His wob continued to increase requiring re-admission to [facility] on 10/31 and was intubated and later trached during hospital admission. Patient discharged on 11/12 [facility] to [facility].    On 12/13 patient admitted to [facility] w/ drop in hgb and concern for GIB. GI consulted for possible endoscopy.  Patient underwent EGD on 08/15/2023 showing duodenitis and mucosal changes in the gastric antrum concerning for ulcer as well.  Bronchoscopy performed 08/16/2023 with small clots removed from distal end of trach; trach tip close proximity to membranous posterior wall; Bivona adjustable length planned.

## 2023-08-21 NOTE — Progress Notes (Signed)
Weight on bed is not working. Will switch bed out when patient wakes up or I will pass this on to day shift if he is still resting at shift change.

## 2023-08-21 NOTE — Progress Notes (Signed)
Progress Note    Joshua Eaton   [id number]  DOB: Jun 09, 1956  DOA: 08/13/2023     7 PCP: Joshua Alken, MD  Initial CC: GIB  Hospital Course: Joshua Eaton is a 67 yo M w/ pertinent PMH chronic respiratory failure w/ tracheostomy/[facility], Afib on eliquis presented to [facility] ED on 12/13 w/ GIB.   Patient recently admitted w/ respiratory failure and pna in October 2024 requiring tracheostomy. He was later transferred to [facility] on 10/28. Later that day had an accidental trach dislodgement. His wob continued to increase requiring re-admission to [facility] on 10/31 and was intubated and later trached during hospital admission. [facility].    On 12/13 patient admitted to [facility] w/ drop in hgb and concern for GIB. GI consulted for possible endoscopy.  Patient underwent EGD on 08/15/2023 showing duodenitis and mucosal changes in the gastric antrum concerning for ulcer as well.  Bronchoscopy performed 08/16/2023 with small clots removed from distal end of trach; trach tip close proximity to membranous posterior wall; Bivona adjustable length planned.   Interval History:  No events overnight. Patient has been monitored in ICU for GIB and remains vent dependent with ongoing attempts for TC trials.  Wife present bedside this morning and update given with questions answered.  Patient was awake, alert, and able to nod head appropriately for answering questions.  Follows commands easily and moves all 4 extremities.  Assessment and Plan:  ABLA 2/2 UGIB/ duodenitis, ongoing slow trend down in Hb- CTA showed active bleeding from gastric antrum/ pylorus with duodenitis.  -PRBCs as necessary -gastric biopsy negative -con't high dose PPI indefinitely per GI, especially since he will eventually need to re-trial DOAC. Home apixaban held for now and will need to monitor Hgb and for evidence of bleeding again once eventually  resumed -transfuse for Hb <7 or hemodynamically significant bleeding   Elevated d-dimer despite treatment for DVT with [facility] for the past few months -high risk for recurrent clots; con't holding apixaban until hemoptysis resolves and Hgb stabilizes    AKI - resolved  -strict I/O -renally dose meds, avoid nephrotoxic meds.    Chronic respiratory failure Trach dependent Hemoptysis secondary to irritation of his trach on the posterior tracheal wall s/p bronch 12/16 - Baseline: Up to 20h TC at [facility], family thinks panic attacks have limited vent weaning. Has been deemed chronic vent dependent. Continue to wean as tolerated.  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  Remus Loffler at bedtime   LUE provoked DVT from picc line -holding DOAC   Rash on knee-- looks like ringworm Infectious disease followed -contact precautions -topical antifungal x 2 weeks total   Petechial rash on hands and feet; blood cultures and serologies negative so far -Appreciate ID's input; workup unrevealing so far. ID team feels clinical appearance most suggestive of drug/abx related rash.  -Blood cultures remain negative  Thrombocytopenia- unclear etiology. Not on antibiotics. With PPI low risk -monitor   Mild hyperglycemia - likely from TF -A1c 4.3% on 07/14/2023 - SSI PRN - goal BG 140-180   Unstagable pressure ulcer on sacrum  POA -frequent turns -optimize nutrition, con't TF -wound care   Moderate protein energy malnutrition  -TF   Severe deconditioning  -PT, OT   Diarrhea -- Discontinue stool softeners/bowel reg   Old records reviewed in assessment of this patient  Antimicrobials:   DVT prophylaxis:  SCDs Start: 08/13/23 2049   Code Status:   Code Status: Full Code  Mobility Assessment (Last 72 Hours)     Mobility Assessment     Row Name 08/20/23 2000 08/19/23 1500 08/19/23 0800       Does patient have an order for bedrest or is patient medically unstable Yes- Bedfast (Level 1) - Complete -- Yes- Bedfast (Level 1) - Complete     What is the highest level of mobility based on the progressive mobility assessment? Level 1 (Bedfast) - Unable to balance while sitting on edge of bed Level 2 (Chairfast) - Balance while sitting on edge of bed and cannot stand Level 1 (Bedfast) - Unable to balance while sitting on edge of bed     Is the above level different from baseline mobility prior to current illness? Yes - Recommend PT order -- Yes - Recommend PT order              Barriers to discharge: awaiting LTAC placement Disposition Plan:  LTAC Status is: Inpt  Objective: Blood pressure (!) 115/54, pulse 78, temperature 98.5 F (36.9 C), temperature source Oral, resp. rate 19, height 6' 0.99" (1.854 m), weight 86.6 kg, SpO2 98%.  Examination:  Physical Exam Constitutional:      General: He is not in acute distress.    Appearance: Normal appearance.  HENT:     Head: Normocephalic and atraumatic.     Mouth/Throat:     Mouth: Mucous membranes are moist.  Eyes:     Extraocular Movements: Extraocular movements intact.  Neck:     Trachea: Tracheostomy present.     Comments: Tracheostomy tube in place Cardiovascular:     Rate and Rhythm: Normal rate and regular rhythm.  Pulmonary:     Effort: Pulmonary effort is normal. No respiratory distress.     Breath sounds: Normal breath sounds. No  wheezing.  Abdominal:     General: Bowel sounds are normal. There is no distension.     Palpations: Abdomen is soft.     Tenderness: There is no abdominal tenderness.     Comments: PEG tube in place  Musculoskeletal:        General: Normal range of motion.     Cervical back: Normal range of motion and neck supple.  Skin:    Comments: B/L knee scattered ertheyma without raised borders; no pain nor pruritus  Neurological:     Mental Status: He is alert.     Comments: Weaker in right upper and lower extremity (4/5) compared to left side   Psychiatric:        Mood and Affect: Mood normal.        Behavior: Behavior normal.      Consultants:  Pulmonology   Procedures:    Data Reviewed: Results for orders placed or performed during the hospital encounter of 08/13/23 (from the past 24 hours)  Glucose, capillary     Status: Abnormal   Collection Time: 08/20/23  3:46 PM  Result Value Ref Range   Glucose-Capillary 132 (H) 70 - 99 mg/dL  Glucose, capillary     Status: Abnormal   Collection Time: 08/20/23  7:12 PM  Result Value Ref Range   Glucose-Capillary 142 (H) 70 - 99 mg/dL  Glucose, capillary     Status: Abnormal   Collection Time: 08/20/23 11:04 PM  Result Value Ref Range   Glucose-Capillary 109 (H) 70 - 99 mg/dL  Basic metabolic panel     Status: Abnormal   Collection Time: 08/21/23  2:34 AM  Result Value Ref Range   Sodium 138 135 - 145 mmol/L   Potassium 4.2 3.5 - 5.1 mmol/L   Chloride 104 98 - 111 mmol/L   CO2 26 22 - 32 mmol/L   Glucose, Bld 124 (H) 70 - 99 mg/dL   BUN 31 (H) 8 - 23 mg/dL   Creatinine, Ser 1.61 0.61 - 1.24 mg/dL   Calcium 8.2 (L) 8.9 - 10.3 mg/dL   GFR, Estimated >09 >60 mL/min   Anion gap 8 5 - 15  CBC     Status: Abnormal   Collection Time: 08/21/23  2:34 AM  Result Value Ref Range   WBC 9.5 4.0 - 10.5 K/uL   RBC 2.35 (L) 4.22 - 5.81 MIL/uL   Hemoglobin 7.2 (L) 13.0 - 17.0 g/dL   HCT 45.4 (L) 09.8 - 11.9 %   MCV 99.1 80.0 - 100.0 fL   MCH  30.6 26.0 - 34.0 pg   MCHC 30.9 30.0 - 36.0 g/dL   RDW 14.7 (H) 82.9 - 56.2 %   Platelets 109 (L) 150 - 400 K/uL   nRBC 0.0 0.0 - 0.2 %  Glucose, capillary     Status: Abnormal   Collection Time: 08/21/23  3:38 AM  Result Value Ref Range   Glucose-Capillary 133 (H) 70 - 99 mg/dL  Glucose, capillary     Status: Abnormal   Collection Time: 08/21/23  7:16 AM  Result Value Ref Range   Glucose-Capillary 132 (H) 70 - 99 mg/dL  Glucose, capillary     Status: Abnormal   Collection Time: 08/21/23 11:04 AM  Result Value Ref Range   Glucose-Capillary 122 (H) 70 - 99 mg/dL    I have reviewed pertinent nursing notes, vitals, labs, and images as necessary. I have ordered labwork to follow up on as indicated.  I have reviewed the last notes from staff over past 24 hours. I have discussed patient's care plan and test results with nursing staff, CM/SW, and other staff as appropriate.  Time spent: Greater than 50% of the 55 minute visit was spent in counseling/coordination of care for the patient as laid out in the A&P.   LOS: 7 days   Lewie Chamber, MD [facility] 08/21/2023, 12:13 PM

## 2023-08-21 NOTE — Progress Notes (Signed)
Pt taken off ventilator and placed on ATC 12L/40%. Pt tolerated well for 5 mins until sats dropped to low 80's. RT suctioned, Sats did not recover. RT placed pt back on the ventilator with 02 boost and recovered quickly. Pt's vitals stable and is tolerating well. RT will monitor.

## 2023-08-21 NOTE — Progress Notes (Signed)
Patient lifted up with the over the bed lift and bed zeroed out. Weight is now documented in chart.

## 2023-08-22 DIAGNOSIS — K922 Gastrointestinal hemorrhage, unspecified: Secondary | ICD-10-CM | POA: Diagnosis not present

## 2023-08-22 DIAGNOSIS — Z930 Tracheostomy status: Secondary | ICD-10-CM | POA: Diagnosis not present

## 2023-08-22 DIAGNOSIS — I82721 Chronic embolism and thrombosis of deep veins of right upper extremity: Secondary | ICD-10-CM | POA: Diagnosis not present

## 2023-08-22 DIAGNOSIS — J9601 Acute respiratory failure with hypoxia: Secondary | ICD-10-CM | POA: Diagnosis not present

## 2023-08-22 LAB — CBC WITH DIFFERENTIAL/PLATELET
Abs Immature Granulocytes: 0.07 10*3/uL (ref 0.00–0.07)
Basophils Absolute: 0 10*3/uL (ref 0.0–0.1)
Basophils Relative: 0 %
Eosinophils Absolute: 0.4 10*3/uL (ref 0.0–0.5)
Eosinophils Relative: 5 %
HCT: 22.6 % — ABNORMAL LOW (ref 39.0–52.0)
Hemoglobin: 7 g/dL — ABNORMAL LOW (ref 13.0–17.0)
Immature Granulocytes: 1 %
Lymphocytes Relative: 13 %
Lymphs Abs: 1.1 10*3/uL (ref 0.7–4.0)
MCH: 30.6 pg (ref 26.0–34.0)
MCHC: 31 g/dL (ref 30.0–36.0)
MCV: 98.7 fL (ref 80.0–100.0)
Monocytes Absolute: 0.7 10*3/uL (ref 0.1–1.0)
Monocytes Relative: 8 %
Neutro Abs: 6.1 10*3/uL (ref 1.7–7.7)
Neutrophils Relative %: 73 %
Platelets: 109 10*3/uL — ABNORMAL LOW (ref 150–400)
RBC: 2.29 MIL/uL — ABNORMAL LOW (ref 4.22–5.81)
RDW: 16.8 % — ABNORMAL HIGH (ref 11.5–15.5)
WBC: 8.3 10*3/uL (ref 4.0–10.5)
nRBC: 0 % (ref 0.0–0.2)

## 2023-08-22 LAB — GLUCOSE, CAPILLARY
Glucose-Capillary: 116 mg/dL — ABNORMAL HIGH (ref 70–99)
Glucose-Capillary: 126 mg/dL — ABNORMAL HIGH (ref 70–99)
Glucose-Capillary: 117 mg/dL — ABNORMAL HIGH (ref 70–99)
Glucose-Capillary: 100 mg/dL — ABNORMAL HIGH (ref 70–99)
Glucose-Capillary: 139 mg/dL — ABNORMAL HIGH (ref 70–99)
Glucose-Capillary: 140 mg/dL — ABNORMAL HIGH (ref 70–99)

## 2023-08-22 LAB — BASIC METABOLIC PANEL
Anion gap: 8 (ref 5–15)
BUN: 34 mg/dL — ABNORMAL HIGH (ref 8–23)
CO2: 28 mmol/L (ref 22–32)
Calcium: 8.3 mg/dL — ABNORMAL LOW (ref 8.9–10.3)
Chloride: 104 mmol/L (ref 98–111)
Creatinine, Ser: 1.03 mg/dL (ref 0.61–1.24)
GFR, Estimated: >60 mL/min (ref >60)
Glucose, Bld: 132 mg/dL — ABNORMAL HIGH (ref 70–99)
Potassium: 4.4 mmol/L (ref 3.5–5.1)
Sodium: 140 mmol/L (ref 135–145)

## 2023-08-22 LAB — MAGNESIUM: Magnesium: 2.2 mg/dL (ref 1.7–2.4)

## 2023-08-22 LAB — PHOSPHORUS: Phosphorus: 3.1 mg/dL (ref 2.5–4.6)

## 2023-08-22 LAB — PREPARE RBC (CROSSMATCH)

## 2023-08-22 LAB — HEMOGLOBIN AND HEMATOCRIT, BLOOD
HCT: 24.7 % — ABNORMAL LOW (ref 39.0–52.0)
Hemoglobin: 7.9 g/dL — ABNORMAL LOW (ref 13.0–17.0)

## 2023-08-22 MED ORDER — SODIUM CHLORIDE 0.9% IV SOLUTION: Freq: Once | INTRAVENOUS | Status: DC

## 2023-08-22 MED ORDER — CHLORHEXIDINE GLUCONATE CLOTH 2 % EX PADS
6.0000 | MEDICATED_PAD | CUTANEOUS | Status: DC
  Administered 2023-08-24 – 2023-09-15 (×22): 6 via TOPICAL

## 2023-08-22 NOTE — Progress Notes (Signed)
Progress Note    Joshua Eaton   ZOX:[id number]  DOB: 1955-10-22  DOA: 08/13/2023     8 PCP: Joshua Alken, MD  Initial CC: GIB  Hospital Course: Joshua Eaton is a 67 yo M w/ pertinent PMH chronic respiratory failure w/ [facility], Afib on eliquis presented to [facility] ED on 12/13 w/ GIB.   Patient recently admitted w/ respiratory failure and pna in October 2024 requiring tracheostomy. He was later transferred to [facility] on 10/28. Later that day had an accidental trach dislodgement. His wob continued to increase requiring re-admission to [facility] on 10/31 and was intubated and later trached during hospital admission. [facility].    On 12/13 patient admitted to [facility] w/ drop in hgb and concern for GIB. GI consulted for possible endoscopy.  Patient underwent EGD on 08/15/2023 showing duodenitis and mucosal changes in the gastric antrum concerning for ulcer as well.  Bronchoscopy performed 08/16/2023 with small clots removed from distal end of trach; trach tip close proximity to membranous posterior wall; Bivona adjustable length planned.   Interval History:  No events overnight. Patient has been monitored in ICU for GIB and remains vent dependent with ongoing attempts for TC trials.  Joshua Eaton present bedside this morning and update given with questions answered.  Patient was awake, alert, and able to nod head appropriately for answering questions.  Follows commands easily and moves all 4 extremities.  Assessment and Plan:  ABLA 2/2 UGIB/ duodenitis, ongoing slow trend down in Hb- CTA showed active bleeding from gastric antrum/ pylorus with duodenitis.  -PRBCs as necessary -gastric biopsy negative -con't high dose PPI indefinitely per GI, especially since he will eventually need to re-trial DOAC. Home apixaban held for now and will need to monitor Hgb and for evidence of bleeding again once eventually  resumed -transfuse for Hb <7 or hemodynamically significant bleeding   Elevated d-dimer despite treatment for DVT with [facility] for the past few months -high risk for recurrent clots; con't holding apixaban until hemoptysis resolves and Hgb stabilizes    AKI - resolved  -strict I/O -renally dose meds, avoid nephrotoxic meds.    Chronic respiratory failure Trach dependent Hemoptysis secondary to irritation of his trach on the posterior tracheal wall s/p bronch 12/16 - Baseline: Up to 20h TC at [facility], family thinks panic attacks have limited vent weaning. Has been deemed chronic vent dependent. Continue to wean as tolerated.  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  Remus Loffler at bedtime   LUE provoked DVT from picc line -holding DOAC   Rash on knee-- ?ringworm Infectious disease followed -contact precautions -topical antifungal x 2 weeks total   Petechial rash on hands and feet; blood cultures and serologies negative so far -Appreciate ID's input; workup unrevealing so far. ID team feels clinical appearance most suggestive of drug/abx related rash.  -Blood cultures remain negative   Thrombocytopenia-  unclear etiology. Not on antibiotics. With PPI low risk -monitor   Mild hyperglycemia - likely from TF -A1c 4.3% on 07/14/2023 - SSI PRN - goal BG 140-180   Unstagable pressure ulcer on sacrum POA -frequent  turns -optimize nutrition, con't TF -wound care   Moderate protein energy malnutrition  -TF   Severe deconditioning  -PT, OT   Diarrhea -- Discontinue stool softeners/bowel reg   Old records reviewed in assessment of this patient  Antimicrobials:   DVT prophylaxis:  SCDs Start: 08/13/23 2049   Code Status:   Code Status: Full Code  Mobility Assessment (Last 72 Hours)     Mobility Assessment     Row Name 08/21/23 2000 08/20/23 2000 08/19/23 1500       Does patient have an order for bedrest or is patient medically unstable No - Continue assessment Yes- Bedfast (Level 1) - Complete --     What is the highest level of mobility based on the progressive mobility assessment? Level 2 (Chairfast) - Balance while sitting on edge of bed and cannot stand Level 1 (Bedfast) - Unable to balance while sitting on edge of bed Level 2 (Chairfast) - Balance while sitting on edge of bed and cannot stand     Is the above level different from baseline mobility prior to current illness? Yes - Recommend PT order Yes - Recommend PT order --              Barriers to discharge: awaiting LTAC placement Disposition Plan:  LTAC Status is: Inpt  Objective: Blood pressure (!) 104/51, pulse 76, temperature 98.6 F (37 C), temperature source Oral, resp. rate (!) 24, height 6' 0.99" (1.854 m), weight 87.9 kg, SpO2 100%.  Examination:  Physical Exam Constitutional:      General: He is not in acute distress.    Appearance: Normal appearance.  HENT:     Head: Normocephalic and atraumatic.     Mouth/Throat:     Mouth: Mucous membranes are moist.  Eyes:     Extraocular Movements: Extraocular movements intact.  Neck:     Trachea: Tracheostomy present.     Comments: Tracheostomy tube in place Cardiovascular:     Rate and Rhythm: Normal rate and regular rhythm.  Pulmonary:     Effort: Pulmonary effort is normal. No respiratory distress.     Breath sounds: Normal breath sounds. No wheezing.   Abdominal:     General: Bowel sounds are normal. There is no distension.     Palpations: Abdomen is soft.     Tenderness: There is no abdominal tenderness.     Comments: PEG tube in place  Musculoskeletal:        General: Normal range of motion.     Cervical back: Normal range of motion and neck supple.  Skin:    Comments: B/L knee scattered ertheyma without raised borders; no pain nor pruritus  Neurological:     Mental Status: He is alert.     Comments: Weaker in right upper and lower extremity (4/5) compared to left side   Psychiatric:        Mood and Affect: Mood normal.        Behavior: Behavior normal.      Consultants:  Pulmonology   Procedures:    Data Reviewed: Results for orders placed or performed during the hospital encounter of 08/13/23 (from the past 24 hours)  Glucose, capillary     Status: Abnormal   Collection Time: 08/21/23  3:08 PM  Result Value Ref Range   Glucose-Capillary 113 (H) 70 - 99 mg/dL  Glucose, capillary     Status: Abnormal   Collection Time: 08/21/23  7:20 PM  Result Value Ref Range   Glucose-Capillary 106 (H) 70 - 99 mg/dL  Glucose, capillary     Status: Abnormal   Collection Time: 08/21/23 11:12 PM  Result Value Ref Range   Glucose-Capillary 152 (H) 70 - 99 mg/dL  Glucose, capillary     Status: Abnormal   Collection Time: 08/22/23  3:16 AM  Result Value Ref Range   Glucose-Capillary 116 (H) 70 - 99 mg/dL  CBC with Differential/Platelet     Status: Abnormal   Collection Time: 08/22/23  4:14 AM  Result Value Ref Range   WBC 8.3 4.0 - 10.5 K/uL   RBC 2.29 (L) 4.22 - 5.81 MIL/uL   Hemoglobin 7.0 (L) 13.0 - 17.0 g/dL   HCT 29.5 (L) 62.1 - 30.8 %   MCV 98.7 80.0 - 100.0 fL   MCH 30.6 26.0 - 34.0 pg   MCHC 31.0 30.0 - 36.0 g/dL   RDW 65.7 (H) 84.6 - 96.2 %   Platelets 109 (L) 150 - 400 K/uL   nRBC 0.0 0.0 - 0.2 %   Neutrophils Relative % 73 %   Neutro Abs 6.1 1.7 - 7.7 K/uL   Lymphocytes Relative 13 %   Lymphs Abs 1.1 0.7 - 4.0  K/uL   Monocytes Relative 8 %   Monocytes Absolute 0.7 0.1 - 1.0 K/uL   Eosinophils Relative 5 %   Eosinophils Absolute 0.4 0.0 - 0.5 K/uL   Basophils Relative 0 %   Basophils Absolute 0.0 0.0 - 0.1 K/uL   Immature Granulocytes 1 %   Abs Immature Granulocytes 0.07 0.00 - 0.07 K/uL  Basic metabolic panel     Status: Abnormal   Collection Time: 08/22/23  4:14 AM  Result Value Ref Range   Sodium 140 135 - 145 mmol/L   Potassium 4.4 3.5 - 5.1 mmol/L   Chloride 104 98 - 111 mmol/L   CO2 28 22 - 32 mmol/L   Glucose, Bld 132 (H) 70 - 99 mg/dL   BUN 34 (H) 8 - 23 mg/dL   Creatinine, Ser 9.52 0.61 - 1.24 mg/dL   Calcium 8.3 (L) 8.9 - 10.3 mg/dL   GFR, Estimated >84 >13 mL/min   Anion gap 8 5 - 15  Magnesium     Status: None   Collection Time: 08/22/23  4:14 AM  Result Value Ref Range   Magnesium 2.2 1.7 - 2.4 mg/dL  Phosphorus     Status: None   Collection Time: 08/22/23  4:14 AM  Result Value Ref Range   Phosphorus 3.1 2.5 - 4.6 mg/dL  Glucose, capillary     Status: Abnormal   Collection Time: 08/22/23  8:00 AM  Result Value Ref Range   Glucose-Capillary 126 (H) 70 - 99 mg/dL  Glucose, capillary     Status: Abnormal   Collection Time: 08/22/23 11:49 AM  Result Value Ref Range   Glucose-Capillary 117 (H) 70 - 99 mg/dL    I have reviewed pertinent nursing notes, vitals, labs, and images as necessary. I have ordered labwork to follow up on as indicated.  I have reviewed the last notes from staff over past 24 hours. I have discussed patient's care plan and test results with nursing staff, CM/SW, and other staff as appropriate.  Time spent: Greater than 50% of the 55 minute visit was  spent in counseling/coordination of care for the patient as laid out in the A&P.   LOS: 8 days   Lewie Chamber, MD [facility] 08/22/2023, 12:16 PM

## 2023-08-23 ENCOUNTER — Inpatient Hospital Stay (HOSPITAL_COMMUNITY): Payer: Medicare Other

## 2023-08-23 DIAGNOSIS — R609 Edema, unspecified: Secondary | ICD-10-CM | POA: Diagnosis not present

## 2023-08-23 DIAGNOSIS — K922 Gastrointestinal hemorrhage, unspecified: Secondary | ICD-10-CM | POA: Diagnosis not present

## 2023-08-23 DIAGNOSIS — J9601 Acute respiratory failure with hypoxia: Secondary | ICD-10-CM | POA: Diagnosis not present

## 2023-08-23 DIAGNOSIS — Z9911 Dependence on respirator [ventilator] status: Secondary | ICD-10-CM | POA: Diagnosis not present

## 2023-08-23 DIAGNOSIS — E440 Moderate protein-calorie malnutrition: Secondary | ICD-10-CM | POA: Diagnosis not present

## 2023-08-23 DIAGNOSIS — Z930 Tracheostomy status: Secondary | ICD-10-CM | POA: Diagnosis not present

## 2023-08-23 LAB — CBC WITH DIFFERENTIAL/PLATELET
Abs Immature Granulocytes: 0.06 10*3/uL (ref 0.00–0.07)
Basophils Absolute: 0 10*3/uL (ref 0.0–0.1)
Basophils Relative: 0 %
Eosinophils Absolute: 0.3 10*3/uL (ref 0.0–0.5)
Eosinophils Relative: 2 %
HCT: 23.7 % — ABNORMAL LOW (ref 39.0–52.0)
Hemoglobin: 7.5 g/dL — ABNORMAL LOW (ref 13.0–17.0)
Immature Granulocytes: 1 %
Lymphocytes Relative: 13 %
Lymphs Abs: 1.7 10*3/uL (ref 0.7–4.0)
MCH: 30.4 pg (ref 26.0–34.0)
MCHC: 31.6 g/dL (ref 30.0–36.0)
MCV: 96 fL (ref 80.0–100.0)
Monocytes Absolute: 1.1 10*3/uL — ABNORMAL HIGH (ref 0.1–1.0)
Monocytes Relative: 9 %
Neutro Abs: 9.5 10*3/uL — ABNORMAL HIGH (ref 1.7–7.7)
Neutrophils Relative %: 75 %
Platelets: 122 10*3/uL — ABNORMAL LOW (ref 150–400)
RBC: 2.47 MIL/uL — ABNORMAL LOW (ref 4.22–5.81)
RDW: 17.4 % — ABNORMAL HIGH (ref 11.5–15.5)
WBC: 12.5 10*3/uL — ABNORMAL HIGH (ref 4.0–10.5)
nRBC: 0 % (ref 0.0–0.2)

## 2023-08-23 LAB — HEMOGLOBIN AND HEMATOCRIT, BLOOD
HCT: 28.1 % — ABNORMAL LOW (ref 39.0–52.0)
Hemoglobin: 8.9 g/dL — ABNORMAL LOW (ref 13.0–17.0)

## 2023-08-23 LAB — GLUCOSE, CAPILLARY
Glucose-Capillary: 116 mg/dL — ABNORMAL HIGH (ref 70–99)
Glucose-Capillary: 117 mg/dL — ABNORMAL HIGH (ref 70–99)
Glucose-Capillary: 118 mg/dL — ABNORMAL HIGH (ref 70–99)
Glucose-Capillary: 119 mg/dL — ABNORMAL HIGH (ref 70–99)
Glucose-Capillary: 137 mg/dL — ABNORMAL HIGH (ref 70–99)
Glucose-Capillary: 99 mg/dL (ref 70–99)

## 2023-08-23 LAB — BRAIN NATRIURETIC PEPTIDE: B Natriuretic Peptide: 457.4 pg/mL — ABNORMAL HIGH (ref 0.0–100.0)

## 2023-08-23 LAB — LACTIC ACID, PLASMA
Lactic Acid, Venous: 1.1 mmol/L (ref 0.5–1.9)
Lactic Acid, Venous: 1.1 mmol/L (ref 0.5–1.9)

## 2023-08-23 LAB — PREPARE RBC (CROSSMATCH)

## 2023-08-23 LAB — PROCALCITONIN: Procalcitonin: 6.17 ng/mL

## 2023-08-23 MED ORDER — FUROSEMIDE 10 MG/ML IJ SOLN
40.0000 mg | Freq: Every day | INTRAMUSCULAR | Status: AC
  Administered 2023-08-23 – 2023-08-25 (×3): 40 mg via INTRAVENOUS
  Filled 2023-08-23 (×3): qty 4

## 2023-08-23 MED ORDER — LORAZEPAM 2 MG/ML IJ SOLN
1.0000 mg | Freq: Once | INTRAMUSCULAR | Status: AC
  Administered 2023-08-23: 1 mg via INTRAVENOUS

## 2023-08-23 MED ORDER — VANCOMYCIN HCL IN DEXTROSE 1-5 GM/200ML-% IV SOLN
1000.0000 mg | Freq: Two times a day (BID) | INTRAVENOUS | Status: DC
Start: 2023-08-23 — End: 2023-08-25
  Administered 2023-08-23 – 2023-08-25 (×4): 1000 mg via INTRAVENOUS
  Filled 2023-08-23 (×4): qty 200

## 2023-08-23 MED ORDER — VANCOMYCIN HCL IN DEXTROSE 1-5 GM/200ML-% IV SOLN
1000.0000 mg | Freq: Once | INTRAVENOUS | Status: AC
  Administered 2023-08-23: 1000 mg via INTRAVENOUS
  Filled 2023-08-23: qty 200

## 2023-08-23 MED ORDER — PIPERACILLIN-TAZOBACTAM 3.375 G IVPB
3.3750 g | Freq: Three times a day (TID) | INTRAVENOUS | Status: DC
  Administered 2023-08-23 – 2023-08-29 (×19): 3.375 g via INTRAVENOUS
  Filled 2023-08-23 (×19): qty 50

## 2023-08-23 MED ORDER — SODIUM CHLORIDE 0.9% IV SOLUTION: Freq: Once | INTRAVENOUS | Status: AC

## 2023-08-23 NOTE — Progress Notes (Signed)
Pharmacy Antibiotic Note  Joshua Eaton is a 67 y.o. male admitted on 08/13/2023 with GI bleed on 12/13 and has developed concerns for sepsis.  Pharmacy has been consulted for zosyn and vancomycin dosing. PMH includes chronic respiratory failure w/ tracheostomy/[facility].  -WBC 8.3 >12.5, sCr 1.03, Tmax 101.5 on 12/22 ~1930 -Blood cultures from admission were NGTD, new ones ordered and respiratory culture grew MRSA  -Trach aspirate culture from 12/5 grew MSSA and serratia marcescens -No antibiotics given this admission  Plan: -Zosyn 3.375gm IV every 8 hours -Vancomycin 2g IV x1 -Vancomycin 1000mg  IV every 12 hours (AUC 467, Vd 0.72, IBW) -Monitor renal function -Follow up signs of clinical improvement, LOT, de-escalation of antibiotics   Height: 6' 0.99" (185.4 cm) Weight: 90 kg (198 lb 6.6 oz) IBW/kg (Calculated) : 79.88  Temp (24hrs), Avg:99.3 F (37.4 C), Min:98.2 F (36.8 C), Max:101.5 F (38.6 C)  Recent Labs  Lab 08/18/23 0314 08/19/23 0347 08/20/23 0500 08/21/23 0234 08/22/23 0414 08/23/23 0323  WBC 10.2 11.1* 10.1 9.5 8.3 12.5*  CREATININE 1.02 0.87 1.28* 0.87 1.03  --     Estimated Creatinine Clearance: 76.5 mL/min (by C-G formula based on SCr of 1.03 mg/dL).    Allergies  Allergen Reactions   Codeine     "sends me into orbit"    Antimicrobials this admission: Zosyn 12/23 >>  Vancomycin 12/23 >>   Microbiology results: 12/16 BCx: NGTD 12/22 Bcx: not yet collected 12/15 Sputum: MRSA  12/14 MRSA PCR: +  Thank you for allowing pharmacy to be a part of this patient's care.  Arabella Merles, PharmD. Clinical Pharmacist 08/23/2023 5:28 AM

## 2023-08-23 NOTE — Procedures (Signed)
Preop diagnosis: Tracheostomy status, tracheal bleeding Postop diagnosis: same Procedure: Fiberoptic tracheoscopy via tracheostomy Surgeon: Jenne Pane Anesth: None Compl: None Findings: Mild streaks of mucus lining the tracheal wall but no active bleeding and no significant erosions. Description:  After discussing risks, benefits, and alternatives with his wife, the fiberoptic bronchoscope was passed down his tracheostomy tube.  The scope was removed.  His tracheostomy tube was then removed and the scope was passed through his stoma to view the trachea down to the carina.  The scope was removed.  A new distal XLT cuffed tracheostomy tube was replaced and the scope again passed down the tracheostomy tube.  He was returned to nursing care on the ventilator in stable condition.

## 2023-08-23 NOTE — Progress Notes (Signed)
RT assisted Dr. Jenne Pane with Bronch at bedside and Trach change. A #6  cuffed Proximal XLT Shiley was removed and a new #6 cuffed Proximal XLT Shiley was placed. Placement verified with bronchoscope and positive color change on ETCO2 monitor. Pt tolerated procedure

## 2023-08-23 NOTE — Progress Notes (Signed)
Patient ID: Joshua Eaton, male   DOB: 1955-09-29, 67 y.o.   MRN: [id number]    Progress Note from the Palliative Medicine Team at [facility]   Patient Name: Joshua Eaton        Date: 08/23/2023 DOB: 11/20/1955  Age: 67 y.o. MRN#: [id number] Attending Physician: Lewie Chamber, MD Primary Care Physician: [name], MD Admit Date: 08/13/2023   Reason for Consultation/Follow-up   Establishing Goals of Care   HPI/ Brief Hospital Review  Mr. Duston is a 67 yo M w/ pertinent PMH chronic respiratory failure w/ [facility]ostomy[facility], Afib on eliquis presented to [facility] ED on 12/13 w/ GIB.   Patient recently admitted w/ respiratory failure and pna in October 2024 requiring tracheostomy. [facility]. Later that day had an accidental trach dislodgement. His wob continued to increase requiring re-admission to [facility] on 10/31 and was intubated and later trached during hospital admission. [facility].    On 12/13 patient admitted to [facility] w/ drop in hgb and concern for GIB. GI consulted for possible endoscopy.  Patient underwent EGD on 08/15/2023 showing duodenitis and mucosal changes in the gastric antrum concerning for ulcer as well.   Bronchoscopy performed 08/16/2023 with small clots removed from distal end of trach; trach tip close proximity to membranous posterior wall; Bivona adjustable length planned.   Patient currently without medical decision making capacity.  Family face treatment option decisions, advanced directive decisions and anticipatory care needs.   Subjective  Extensive chart review has been completed prior to meeting with patient/family  including labs, vital signs, imaging, progress/consult notes, orders, medications and available advance directive documents.   This NP assessed patient at the bedside as a follow up to  yesterday's GOCs meeting.   Patient's wife at  bedside.  Ongoing conversation regarding current medical situation specific to acute on chronic respiratory failure and vent dependency. Overall failure to thrive, high risk for frequent  infections, high risk for decompensation.     Wife was verbalizing great concern regarding patient's trach, specific to "Special" by Bivona trach and need to wait for delivery .  Therapeutic listening  She tells me there was some confusion around her not wanting CCM to come into the room to manage the trach.  She tells me she is open to CCM managing trach however she would like a second opinion regarding trach condition.   In conversation with Dr. Frederick Peers he is consulting ENT, and again wife is willing for CCMs input.   Education offered today regarding  the importance of continued conversation within the  family and their  medical providers regarding overall plan of care and treatment options,  ensuring decisions are within the context of the patients values and GOCs.  I did attempt to explore patient's ability to name a healthcare power of attorney, unfortunately patient does not have decision-making capacity.  At this time without a documented H POA patient's wife is the Management consultant for Mr. Hisle.   Questions and concerns addressed   Discussed with primary team/ Dr Frederick Peers and nursing staff   Time:   69 minutes minutes  Detailed review of medical records ( labs, imaging, vital signs), medically appropriate exam ( MS, skin, cardiac,  resp)   discussed with treatment team, counseling and education to patient, family, staff, documenting clinical information, medication management, coordination of care    Lorinda Creed NP  Palliative Medicine Team Team Phone #  [phone] Pager [phone]

## 2023-08-23 NOTE — Progress Notes (Signed)
This nurse notified [facility] hospitalists d/t patient's  soft BP with maps between 55-65. SBP 90's-100's (normal is 110's-125 Systolic). He was febrile today with tmax 103 per dayshift RN, although I don't see it charted. My tmax was 101.5 @ 1933, and on recheck, after 650 PO tylenol it is 99.0 (orally). Will continue to follow patient's careplan. He is resting in bed, call bell within reach, and wife at bedside.

## 2023-08-23 NOTE — TOC Progression Note (Addendum)
Transition of Care [facility]) - Progression Note    Patient Details  Name: Joshua Eaton MRN: [id number] Date of Birth: 06/02/1956  [facility] of [facility]) CM/SW Contact  Tom-Johnson, Hershal Coria, RN Phone Number: 08/23/2023, 9:51 AM  Clinical Narrative:     CM informed that a informed that a Peer to Peer is needed for LTAC and due by 12 noon today 08/23/23. Number to call is [phone] opt 5. Patient's ID # is [id number]. MD notified via Secure chat.   Patient currently on Trach-vent. Holding off  [employer] today, awaiting Bivona Trach.  Patient on IV abx with concerns for Sepsis. Palliative following.   Patient not Medically ready for discharge.  CM will continue to follow as patient progresses with care towards discharge.   12:15- CM spoke with patient's wife [name] per her request. States she is concerned about patient bleeding from his Janina Mayo, requesting Anesthesiologist instead of Pulmonary Care to evaluate. Also concerned about Adolph Pollack, states Janina Mayo was recalled and patient needs to be tested to see if he is allergic to [facility]. Wants patient transferred to [facility].  CM informed [name] she should talk with MD and let her concerns known.   CM will continue to follow as patient progresses with care towards discharge.            Expected Discharge Plan and Services                                               Social Determinants of Health (SDOH) Interventions SDOH Screenings   Food Insecurity: No Food Insecurity (08/21/2023)  Housing: Unknown (08/21/2023)  Transportation Needs: No Transportation Needs (08/21/2023)  Utilities: Not At Risk (08/21/2023)  Financial Resource Strain: Patient Unable To Answer (07/14/2023)   Received from [facility]  Social Connections: Patient Unable To Answer (07/14/2023)   Received from [facility]  Stress: Patient Unable To Answer (07/14/2023)   Received from [facility]  Tobacco Use: Low Risk   (08/15/2023)    Readmission Risk Interventions    08/16/2023    3:55 PM  Readmission Risk Prevention Plan  Transportation Screening Complete  Medication Review (RN Care Manager) Referral to Pharmacy  PCP or Specialist appointment within 3-5 days of discharge Complete  HRI or Home Care Consult Complete  SW Recovery Care/Counseling Consult Complete  Skilled Nursing Facility Complete

## 2023-08-23 NOTE — Consult Note (Signed)
Reason for Consult:Tracheostomy problem Referring Physician: CCM  Joshua Eaton is an 67 y.o. male.  HPI: 67 year old male required tracheostomy about six weeks ago and required tube changes early on due to poor fit.  He currently has a distal XLT tube in place.  He has also had bleeding that can be significant at times.  Consultation was requested to check his tracheostomy.  Past Medical History:  Diagnosis Date   Anxiety    Cancer (HCC)    Depression    Hypertension    OCD (obsessive compulsive disorder)     Past Surgical History:  Procedure Laterality Date   BIOPSY  08/15/2023   Procedure: BIOPSY;  Surgeon: Benancio Deeds, MD;  Location: [facility] ENDOSCOPY;  Service: Gastroenterology;;   CHOLECYSTECTOMY     COLON SURGERY     cancerous polyp removed   ESOPHAGOGASTRODUODENOSCOPY (EGD) WITH PROPOFOL N/A 08/15/2023   Procedure: ESOPHAGOGASTRODUODENOSCOPY (EGD) WITH PROPOFOL;  Surgeon: Benancio Deeds, MD;  Location: [facility] ENDOSCOPY;  Service: Gastroenterology;  Laterality: N/A;   HERNIA REPAIR     IR GASTROSTOMY TUBE MOD SED  06/24/2023    History reviewed. No pertinent family history.  Social History:  reports that he has never smoked. He has never been exposed to tobacco smoke. He has never used smokeless tobacco. He reports that he does not drink alcohol and does not use drugs.  Allergies:  Allergies  Allergen Reactions   Codeine     "sends me into orbit"    Medications: I have reviewed the patient's current medications.  Results for orders placed or performed during the hospital encounter of 08/13/23 (from the past 48 hours)  Glucose, capillary     Status: Abnormal   Collection Time: 08/21/23  7:20 PM  Result Value Ref Range   Glucose-Capillary 106 (H) 70 - 99 mg/dL    Comment: Glucose reference range applies only to samples taken after fasting for at least 8 hours.  Glucose, capillary     Status: Abnormal   Collection Time: 08/21/23 11:12 PM  Result Value Ref Range    Glucose-Capillary 152 (H) 70 - 99 mg/dL    Comment: Glucose reference range applies only to samples taken after fasting for at least 8 hours.  Glucose, capillary     Status: Abnormal   Collection Time: 08/22/23  3:16 AM  Result Value Ref Range   Glucose-Capillary 116 (H) 70 - 99 mg/dL    Comment: Glucose reference range applies only to samples taken after fasting for at least 8 hours.  CBC with Differential/Platelet     Status: Abnormal   Collection Time: 08/22/23  4:14 AM  Result Value Ref Range   WBC 8.3 4.0 - 10.5 K/uL   RBC 2.29 (L) 4.22 - 5.81 MIL/uL   Hemoglobin 7.0 (L) 13.0 - 17.0 g/dL   HCT 16.1 (L) 09.6 - 04.5 %   MCV 98.7 80.0 - 100.0 fL   MCH 30.6 26.0 - 34.0 pg   MCHC 31.0 30.0 - 36.0 g/dL   RDW 40.9 (H) 81.1 - 91.4 %   Platelets 109 (L) 150 - 400 K/uL   nRBC 0.0 0.0 - 0.2 %   Neutrophils Relative % 73 %   Neutro Abs 6.1 1.7 - 7.7 K/uL   Lymphocytes Relative 13 %   Lymphs Abs 1.1 0.7 - 4.0 K/uL   Monocytes Relative 8 %   Monocytes Absolute 0.7 0.1 - 1.0 K/uL   Eosinophils Relative 5 %   Eosinophils Absolute 0.4  0.0 - 0.5 K/uL   Basophils Relative 0 %   Basophils Absolute 0.0 0.0 - 0.1 K/uL   Immature Granulocytes 1 %   Abs Immature Granulocytes 0.07 0.00 - 0.07 K/uL    Comment: Performed at [facility], [address], [place], [place] [address]  Basic metabolic panel     Status: Abnormal   Collection Time: 08/22/23  4:14 AM  Result Value Ref Range   Sodium 140 135 - 145 mmol/L   Potassium 4.4 3.5 - 5.1 mmol/L   Chloride 104 98 - 111 mmol/L   CO2 28 22 - 32 mmol/L   Glucose, Bld 132 (H) 70 - 99 mg/dL    Comment: Glucose reference range applies only to samples taken after fasting for at least 8 hours.   BUN 34 (H) 8 - 23 mg/dL   Creatinine, Ser 8.65 0.61 - 1.24 mg/dL   Calcium 8.3 (L) 8.9 - 10.3 mg/dL   GFR, Estimated >78 >46 mL/min    Comment: (NOTE) Calculated using the CKD-EPI Creatinine Equation (2021)    Anion gap 8 5 - 15    Comment:  Performed at [facility], [address], [place], [place] [address]  Magnesium     Status: None   Collection Time: 08/22/23  4:14 AM  Result Value Ref Range   Magnesium 2.2 1.7 - 2.4 mg/dL    Comment: Performed at [facility], [address], [place], [place] [address]  Phosphorus     Status: None   Collection Time: 08/22/23  4:14 AM  Result Value Ref Range   Phosphorus 3.1 2.5 - 4.6 mg/dL    Comment: Performed at [facility], [address] [place], [place] [address]  Glucose, capillary     Status: Abnormal   Collection Time: 08/22/23  8:00 AM  Result Value Ref Range   Glucose-Capillary 126 (H) 70 - 99 mg/dL    Comment: Glucose reference range applies only to samples taken after fasting for at least 8 hours.  Prepare RBC (crossmatch)     Status: None   Collection Time: 08/22/23  8:13 AM  Result Value Ref Range   Order Confirmation      ORDER PROCESSED BY BLOOD BANK Performed at [facility], [address], [place], [place] [address]   Glucose, capillary     Status: Abnormal   Collection Time: 08/22/23 11:49 AM  Result Value Ref Range   Glucose-Capillary 117 (H) 70 - 99 mg/dL    Comment: Glucose reference range applies only to samples taken after fasting for at least 8 hours.  Type and screen [facility]     Status: None (Preliminary result)   Collection Time: 08/22/23 12:40 PM  Result Value Ref Range   ABO/RH(D) O POS    Antibody Screen NEG    Sample Expiration 08/25/2023,2359    Unit Number [id number]    Blood Component Type RED CELLS,LR    Unit division 00    Status of Unit ISSUED,FINAL    Transfusion Status OK TO TRANSFUSE    Crossmatch Result      Compatible Performed at [facility], [address], [place], [place] [address]    Unit Number [id number]    Blood Component Type RED CELLS,LR    Unit division 00    Status of Unit ISSUED    Transfusion Status OK TO TRANSFUSE    Crossmatch Result Compatible    Glucose,  capillary     Status: Abnormal   Collection Time: 08/22/23  3:37 PM  Result Value Ref Range   Glucose-Capillary 100 (H) 70 - 99 mg/dL    Comment: Glucose reference range applies only to samples taken after fasting for at least 8 hours.  Hemoglobin and hematocrit, blood     Status: Abnormal   Collection Time: 08/22/23  7:30 PM  Result Value Ref Range   Hemoglobin 7.9 (L) 13.0 - 17.0 g/dL   HCT 16.1 (L) 09.6 - 04.5 %    Comment: Performed at [facility], [address] [place], [place] [address]  Glucose, capillary     Status: Abnormal   Collection Time: 08/22/23  7:40 PM  Result Value Ref Range   Glucose-Capillary 139 (H) 70 - 99 mg/dL    Comment: Glucose reference range applies only to samples taken after fasting for at least 8 hours.  Glucose, capillary     Status: Abnormal   Collection Time: 08/22/23 11:12 PM  Result Value Ref Range   Glucose-Capillary 140 (H) 70 - 99 mg/dL    Comment: Glucose reference range applies only to samples taken after fasting for at least 8 hours.  Glucose, capillary     Status: None   Collection Time: 08/23/23  3:19 AM  Result Value Ref Range   Glucose-Capillary 99 70 - 99 mg/dL    Comment: Glucose reference range applies only to samples taken after fasting for at least 8 hours.  CBC with Differential/Platelet     Status: Abnormal   Collection Time: 08/23/23  3:23 AM  Result Value Ref Range   WBC 12.5 (H) 4.0 - 10.5 K/uL   RBC 2.47 (L) 4.22 - 5.81 MIL/uL   Hemoglobin 7.5 (L) 13.0 - 17.0 g/dL   HCT 19.1 (L) 47.8 - 29.5 %   MCV 96.0 80.0 - 100.0 fL   MCH 30.4 26.0 - 34.0 pg   MCHC 31.6 30.0 - 36.0 g/dL   RDW 62.1 (H) 30.8 - 65.7 %   Platelets 122 (L) 150 - 400 K/uL   nRBC 0.0 0.0 - 0.2 %   Neutrophils Relative % 75 %   Neutro Abs 9.5 (H) 1.7 - 7.7 K/uL   Lymphocytes Relative 13 %   Lymphs Abs 1.7 0.7 - 4.0 K/uL   Monocytes Relative 9 %   Monocytes Absolute 1.1 (H) 0.1 - 1.0 K/uL   Eosinophils Relative 2 %   Eosinophils  Absolute 0.3 0.0 - 0.5 K/uL   Basophils Relative 0 %   Basophils Absolute 0.0 0.0 - 0.1 K/uL   Immature Granulocytes 1 %   Abs Immature Granulocytes 0.06 0.00 - 0.07 K/uL    Comment: Performed at [facility], [address], [place], [place] [address]  Brain natriuretic peptide     Status: Abnormal   Collection Time: 08/23/23  3:23 AM  Result Value Ref Range   B Natriuretic Peptide 457.4 (H) 0.0 - 100.0 pg/mL    Comment: Performed at [facility], [address], [place], [place] [address]  Procalcitonin     Status: None   Collection Time: 08/23/23  3:23 AM  Result Value Ref Range   Procalcitonin 6.17 ng/mL    Comment:        Interpretation: PCT > 2 ng/mL: Systemic infection (sepsis) is likely, unless other causes are known. (NOTE)       Sepsis PCT Algorithm           Lower  Respiratory Tract                                      Infection PCT Algorithm    ----------------------------     ----------------------------         PCT < 0.25 ng/mL                PCT < 0.10 ng/mL          Strongly encourage             Strongly discourage   discontinuation of antibiotics    initiation of antibiotics    ----------------------------     -----------------------------       PCT 0.25 - 0.50 ng/mL            PCT 0.10 - 0.25 ng/mL               OR       >80% decrease in PCT            Discourage initiation of                                            antibiotics      Encourage discontinuation           of antibiotics    ----------------------------     -----------------------------         PCT >= 0.50 ng/mL              PCT 0.26 - 0.50 ng/mL               AND       <80% decrease in PCT              Encourage initiation of                                             antibiotics       Encourage continuation           of antibiotics    ----------------------------     -----------------------------        PCT >= 0.50 ng/mL                  PCT > 0.50 ng/mL               AND          increase in PCT                  Strongly encourage                                      initiation of antibiotics    Strongly encourage escalation           of antibiotics                                     -----------------------------  PCT <= 0.25 ng/mL                                                 OR                                        > 80% decrease in PCT                                      Discontinue / Do not initiate                                             antibiotics  Performed at [facility], [address], [place], [place] [address]   Lactic acid, plasma     Status: None   Collection Time: 08/23/23  4:38 AM  Result Value Ref Range   Lactic Acid, Venous 1.1 0.5 - 1.9 mmol/L    Comment: Performed at [facility], [address] [place], [place] [address]  Prepare RBC (crossmatch)     Status: None   Collection Time: 08/23/23  5:22 AM  Result Value Ref Range   Order Confirmation      ORDER PROCESSED BY BLOOD BANK Performed at [facility], [address] [place], [place] [address]   Glucose, capillary     Status: Abnormal   Collection Time: 08/23/23  7:22 AM  Result Value Ref Range   Glucose-Capillary 137 (H) 70 - 99 mg/dL    Comment: Glucose reference range applies only to samples taken after fasting for at least 8 hours.  Glucose, capillary     Status: Abnormal   Collection Time: 08/23/23 11:27 AM  Result Value Ref Range   Glucose-Capillary 116 (H) 70 - 99 mg/dL    Comment: Glucose reference range applies only to samples taken after fasting for at least 8 hours.  Lactic acid, plasma     Status: None   Collection Time: 08/23/23 11:34 AM  Result Value Ref Range   Lactic Acid, Venous 1.1 0.5 - 1.9 mmol/L    Comment: Performed at [facility], [address] [place], [place] [address]  Hemoglobin and hematocrit, blood     Status: Abnormal   Collection Time: 08/23/23 11:34 AM  Result  Value Ref Range   Hemoglobin 8.9 (L) 13.0 - 17.0 g/dL   HCT 62.9 (L) 52.8 - 41.3 %    Comment: Performed at [facility], [address], [place], [place] [address]  Glucose, capillary     Status: Abnormal   Collection Time: 08/23/23  3:16 PM  Result Value Ref Range   Glucose-Capillary 117 (H) 70 - 99 mg/dL    Comment: Glucose reference range applies only to samples taken after fasting for at least 8 hours.    VAS [place] UPPER EXTREMITY VENOUS DUPLEX Result Date: 08/23/2023 UPPER VENOUS STUDY  Patient Name:  Joshua Eaton  Date of Exam:   08/23/2023 Medical Rec #: [id number]   Accession #:    [id number] Date of Birth:  February 09, 1956    Patient Gender: M Patient Age:   3 years Exam Location:  [facility] Procedure:      VAS [place] UPPER EXTREMITY VENOUS DUPLEX Referring Phys: DAVID GIRGUIS --------------------------------------------------------------------------------  Indications: Edema Risk Factors: None identified. Comparison Study: 06/16/2023 - Right:                   Findings consistent with acute deep vein thrombosis involving                   the right                   axillary vein. Findings consistent with acute superficial vein                   thrombosis involving                   the right cephalic vein.                    Left:                   Findings consistent with acute superficial vein thrombosis                   involving the                   left cephalic vein. Performing Technologist: Chanda Busing RVT  Examination Guidelines: A complete evaluation includes B-mode imaging, spectral Doppler, color Doppler, and power Doppler as needed of all accessible portions of each vessel. Bilateral testing is considered an integral part of a complete examination. Limited examinations for reoccurring indications may be performed as noted.  Right Findings: +----------+------------+---------+-----------+----------+-------+ RIGHT     CompressiblePhasicitySpontaneousPropertiesSummary  +----------+------------+---------+-----------+----------+-------+ IJV           Full       Yes       Yes                      +----------+------------+---------+-----------+----------+-------+ Subclavian    Full       Yes       Yes                      +----------+------------+---------+-----------+----------+-------+ Axillary      Full       Yes       Yes                      +----------+------------+---------+-----------+----------+-------+ Brachial      Full                                          +----------+------------+---------+-----------+----------+-------+ Radial        Full                                          +----------+------------+---------+-----------+----------+-------+ Ulnar         Full                                          +----------+------------+---------+-----------+----------+-------+ Cephalic  Full                                          +----------+------------+---------+-----------+----------+-------+ Basilic       Full                                          +----------+------------+---------+-----------+----------+-------+  Left Findings: +----------+------------+---------+-----------+----------+-------+ LEFT      CompressiblePhasicitySpontaneousPropertiesSummary +----------+------------+---------+-----------+----------+-------+ Subclavian    Full       Yes       Yes                      +----------+------------+---------+-----------+----------+-------+  Summary:  Right: No evidence of deep vein thrombosis in the upper extremity. No evidence of superficial vein thrombosis in the upper extremity.  Left: No evidence of thrombosis in the subclavian.  *See table(s) above for measurements and observations.    Preliminary    DG CHEST PORT 1 VIEW Result Date: 08/23/2023 CLINICAL DATA:  67 year old male with history of productive cough. EXAM: PORTABLE CHEST 1 VIEW COMPARISON:  Chest x-ray 08/16/2023.  FINDINGS: A tracheostomy tube is in place with tip 4.7 cm above the carina. There is a right-sided internal jugular central venous catheter with tip terminating in the superior cavoatrial junction. Lung volumes are low. Extensive bibasilar opacities which may reflect areas of atelectasis and/or consolidation. Small bilateral pleural effusions. No pneumothorax. Pulmonary vasculature is obscured. Heart size appears mildly enlarged. Upper mediastinal contours are within normal limits allowing for patient positioning. IMPRESSION: 1. Support apparatus, as above. 2. Extensive bibasilar areas of atelectasis and/or consolidation with superimposed small bilateral pleural effusions. Electronically Signed   By: Trudie Reed M.D.   On: 08/23/2023 05:47    Review of Systems  Unable to perform ROS: Acuity of condition   Blood pressure (!) 126/55, pulse 79, temperature 98.1 F (36.7 C), temperature source Oral, resp. rate 19, height 6' 0.99" (1.854 m), weight 90 kg, SpO2 100%. Physical Exam Constitutional:      Appearance: Normal appearance. He is normal weight.  HENT:     Head: Normocephalic and atraumatic.     Right Ear: External ear normal.     Left Ear: External ear normal.     Nose: Nose normal.     Mouth/Throat:     Mouth: Mucous membranes are moist.     Pharynx: Oropharynx is clear.  Eyes:     Extraocular Movements: Extraocular movements intact.     Conjunctiva/sclera: Conjunctivae normal.     Pupils: Pupils are equal, round, and reactive to light.  Neck:     Comments: #6 distal XLT cuffed trach tube in place, on ventilator. Cardiovascular:     Rate and Rhythm: Normal rate.  Pulmonary:     Effort: Pulmonary effort is normal.  Skin:    General: Skin is warm and dry.  Neurological:     Mental Status: He is alert.     Assessment/Plan: Tracheostomy status, tracheal bleeding  Fiberoptic exam was performed through the trach tube and with the trach tube out.  No significant problems were  visualized and no active bleeding.  A new #6 distal XLT cuffed trach was placed.  I discussed his case with Anders Simmonds from [facility]CM who has ordered a Bivona trach for him.  I  do not see a reason, given his body habitus and neck size, why he should need an extended length tube.  The Bivona tube is flexible and adaptable so sounds like a good choice.  His wife was reassured about the fiberoptic findings.  Christia Reading 08/23/2023, 5:57 PM

## 2023-08-23 NOTE — Progress Notes (Addendum)
PCCM Interval Progress Note:  Family has stated that they do not want to see Pulmonary/Critical Care re: tracheostomy; adamantly requesting "anesthesiology" evaluation for tracheal wall wound while awaiting Bivona trach. The primary team is more than welcome to request anesthesiology evaluation or, likely more appropriate, ENT evaluation per family's request.  Faythe Ghee [facility] 08/23/23 9:29 AM  Please see [email or web address] for pager details.  From 7A-7P if no response, please call [phone] After hours, please call ELink [phone]

## 2023-08-23 NOTE — Progress Notes (Signed)
Received a call from bedside RN regarding the patient becoming febrile with Tmax of 101.5 overnight, and Tmax of 103 on day shift(08/22/23), borderline hypotensive with MAP of 64.  This morning, the patient has abnormal labs with uptrending leukocytosis and downtrending hemoglobin.  Presented at bedside, the patient is asleep with his wife at bedside.  He is arousable with a productive cough and increased secretions which were suctioned by his bedside RN.  Bilateral diffuse rales noted on exam.  Lactic acid, procalcitonin, BNP, peripheral blood cultures x 2, chest x-ray ordered.  Due to concern for developing sepsis started broad-spectrum IV antibiotics Merrem and IV vancomycin.  Drop in hemoglobin>> suspect chronic blood loss from sacral decubitus wound versus upper GI bleed(CTA GI bleed 08/12/2023), the patient is on IV Protonix twice daily.  Will transfuse 1 unit PRBCs to maintain hemoglobin above 8.0.Marland Kitchen  Updated the patient's wife at bedside.  Will continue to closely monitor and treat as indicated.  Critical care time: 35 minutes.

## 2023-08-23 NOTE — Progress Notes (Signed)
SLP Cancellation Note  Patient Details Name: Joshua Eaton MRN: [id number] DOB: April 23, 1956   Cancelled treatment:       Reason Eval/Treat Not Completed: Medical issues which prohibited therapy. Pt remains on vent - per RN, not likely to do trach collar today as he is awaiting his Bivona trach. SLP to f/u as able.     Mahala Menghini., M.A. CCC-SLP Acute Rehabilitation Services Office [phone]  Secure chat preferred  08/23/2023, 8:02 AM

## 2023-08-23 NOTE — Progress Notes (Signed)
Dow Adolph, DO, [facility]ad hospitalist, made aware of patient's wbc=12.5 (from 8.3) and hgb dropped from 7.9 (post-transfusion) to 7.5, patient afebrile, and increased drainage from the stage 3/4 sacral pressure ulcer. Continuing to follow patient's care plan.

## 2023-08-23 NOTE — Progress Notes (Signed)
Bivona trach was ordered last week some time and will take approximately two weeks to arrive. RN updated.

## 2023-08-23 NOTE — Progress Notes (Signed)
Progress Note    Joshua Eaton   WGN:[id number]  DOB: Jun 05, 1956  DOA: 08/13/2023     9 PCP: Joshua Alken, MD  Initial CC: GIB  Hospital Course: Joshua Eaton is a 67 yo M w/ pertinent PMH chronic respiratory failure w/ tracheostomy/[facility] dependent [facility], Afib on eliquis presented to [facility] ED on 12/13 w/ GIB.   Patient recently admitted w/ respiratory failure and pna in October 2024 requiring tracheostomy initially on 06/18/23. He was later transferred to [facility] on 10/28. Later that day had an accidental trach dislodgement. His wob continued to increase requiring re-admission to [facility] on 10/31 and was intubated and then underwent redo trach on 07/05/23.  He was noted to have some tracheal ring damage and has had intermittent bleeding with clots from suction. Patient discharged [facility].    On 12/13 patient admitted to [facility] w/ drop in hgb and concern for GIB. GI consulted for possible endoscopy.  Patient underwent EGD on 08/15/2023 showing duodenitis and mucosal changes in the gastric antrum concerning for ulcer as well.  Bronchoscopy performed 08/16/2023 with small clots removed from distal end of trach; trach tip close proximity to membranous posterior wall; Bivona adjustable length trach was ordered.   Interval History:  Wife bedside and upset this morning over the ongoing "bleeding" from the trach. Also upset over his "swelling" in his extremities. She wanted a 2nd opinion about his trach. I then discussed case further with pulmonology and I consulted ENT.   Assessment and Plan:  Fever - patient became febrile on 12/22 and WBC uptrended -CXR performed on 08/23/2023 showing bibasilar opacities concerning for atelectasis versus infiltrates - Procalcitonin also elevated from prior values - Follow-up blood cultures - Continue Vanco and Zosyn for now; monitor renal function while on combo  ABLA 2/2 UGIB/ duodenitis - CTA showed active  bleeding from gastric antrum/ pylorus with duodenitis -gastric biopsy negative -con't high dose PPI indefinitely per GI, especially since he will eventually need to re-trial DOAC - Home apixaban held for now and will need to monitor Hgb and for evidence of bleeding again once eventually resumed -transfuse for Hb <7 or hemodynamically significant bleeding   Hx RUE DVT 06/16/23 - Eliquis on hold until Hgb stabilizes - repeat RUE duplex performed 12/23: no further DVT noted (prior RUE DVT noted on 10/16 initially) -Left upper extremity duplex from 08/14/2023 showed age-indeterminate SVT involving left cephalic vein, no DVT  Chronic respiratory failure Trach dependency Hemoptysis secondary to irritation of his trach on the posterior tracheal wall s/p bronch 12/16 - Baseline: Up to 20h TC at [facility], family thinks panic attacks have limited vent weaning. Has been deemed chronic vent dependent. Continue to wean as tolerated.  - Received TXA this admission, hold further doses - Awaiting Bivona trach; on order  - Trach care per protocol - Gabapentin for cough, tolerating well  - wife requesting 2nd opinion on 12/23 regarding ongoing bleeding/clots from trach   AKI - resolved  -strict I/O -renally dose meds, avoid nephrotoxic meds.    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  Remus Loffler at bedtime   Rash on knee-- ?ringworm Infectious disease followed -contact precautions -topical antifungal x 2 weeks total   Petechial rash on hands and feet; blood cultures  and serologies negative so far -Appreciate ID's input; workup unrevealing so far. ID team feels clinical appearance most suggestive of drug/abx related rash.  -Blood cultures remain negative   Thrombocytopenia- unclear etiology. Not on antibiotics. With PPI low risk -monitor   Mild hyperglycemia - likely from TF -A1c 4.3% on 07/14/2023 - SSI PRN - goal BG 140-180   Unstagable pressure ulcer on sacrum POA -frequent turns -optimize nutrition, con't TF -wound care   Moderate protein energy malnutrition  -TF   Severe deconditioning  -PT, OT   Diarrhea -- Discontinue stool softeners/bowel reg   Old records reviewed in assessment of this patient  Antimicrobials:   DVT prophylaxis:  SCDs Start: 08/13/23 2049   Code Status:   Code Status: Full Code  Mobility Assessment (Last 72 Hours)     Mobility Assessment     Row Name 08/22/23 2000 08/21/23 2000 08/20/23 2000       Does patient have an order for bedrest or is patient medically unstable No - Continue assessment No - Continue assessment Yes- Bedfast (Level 1) - Complete     What is the highest level of mobility based on the progressive mobility assessment? Level 2 (Chairfast) - Balance while sitting on edge of bed and cannot stand Level 2 (Chairfast) - Balance while sitting on edge of bed and cannot stand Level 1 (Bedfast) - Unable to balance while sitting on edge of bed     Is the above level different from baseline mobility prior to current illness? Yes - Recommend PT order Yes - Recommend PT order Yes - Recommend PT order              Barriers to discharge: awaiting LTAC placement but now denied by insurance  Disposition Plan:  TBD Status is: Inpt  Objective: Blood pressure (!) 130/56, pulse 78, temperature 98.7 F (37.1 C), temperature source Oral, resp. rate (!) 25, height 6' 0.99" (1.854 m), weight 90 kg, SpO2 99%.  Examination:  Physical Exam Constitutional:      General: He is not in acute  distress.    Appearance: Normal appearance.  HENT:     Head: Normocephalic and atraumatic.     Mouth/Throat:     Mouth: Mucous membranes are moist.  Eyes:     Extraocular Movements: Extraocular movements intact.  Neck:     Trachea: Tracheostomy present.     Comments: Tracheostomy tube in place Cardiovascular:     Rate and Rhythm: Normal rate and regular rhythm.  Pulmonary:     Effort: Pulmonary effort is normal. No respiratory distress.     Breath sounds: Normal breath sounds. No wheezing.  Abdominal:     General: Bowel sounds are normal. There is no distension.     Palpations: Abdomen is soft.     Tenderness: There is no abdominal tenderness.     Comments: PEG tube in place  Musculoskeletal:        General: Swelling (RUE 2-3+) present. No tenderness. Normal range of motion.     Cervical back: Normal range of motion and neck supple.  Skin:    Comments: B/L knee scattered ertheyma without raised borders; no pain nor pruritus. Improving skin discolorations in B/L LE  Neurological:     Mental Status: He is alert.  Comments: Weaker in right upper and lower extremity (4/5) compared to left side   Psychiatric:        Mood and Affect: Mood normal.        Behavior: Behavior normal.      Consultants:  Pulmonology   Procedures:    Data Reviewed: Results for orders placed or performed during the hospital encounter of 08/13/23 (from the past 24 hours)  Glucose, capillary     Status: Abnormal   Collection Time: 08/22/23  3:37 PM  Result Value Ref Range   Glucose-Capillary 100 (H) 70 - 99 mg/dL  Hemoglobin and hematocrit, blood     Status: Abnormal   Collection Time: 08/22/23  7:30 PM  Result Value Ref Range   Hemoglobin 7.9 (L) 13.0 - 17.0 g/dL   HCT 10.2 (L) 72.5 - 36.6 %  Glucose, capillary     Status: Abnormal   Collection Time: 08/22/23  7:40 PM  Result Value Ref Range   Glucose-Capillary 139 (H) 70 - 99 mg/dL  Glucose, capillary     Status: Abnormal   Collection  Time: 08/22/23 11:12 PM  Result Value Ref Range   Glucose-Capillary 140 (H) 70 - 99 mg/dL  Glucose, capillary     Status: None   Collection Time: 08/23/23  3:19 AM  Result Value Ref Range   Glucose-Capillary 99 70 - 99 mg/dL  CBC with Differential/Platelet     Status: Abnormal   Collection Time: 08/23/23  3:23 AM  Result Value Ref Range   WBC 12.5 (H) 4.0 - 10.5 K/uL   RBC 2.47 (L) 4.22 - 5.81 MIL/uL   Hemoglobin 7.5 (L) 13.0 - 17.0 g/dL   HCT 44.0 (L) 34.7 - 42.5 %   MCV 96.0 80.0 - 100.0 fL   MCH 30.4 26.0 - 34.0 pg   MCHC 31.6 30.0 - 36.0 g/dL   RDW 95.6 (H) 38.7 - 56.4 %   Platelets 122 (L) 150 - 400 K/uL   nRBC 0.0 0.0 - 0.2 %   Neutrophils Relative % 75 %   Neutro Abs 9.5 (H) 1.7 - 7.7 K/uL   Lymphocytes Relative 13 %   Lymphs Abs 1.7 0.7 - 4.0 K/uL   Monocytes Relative 9 %   Monocytes Absolute 1.1 (H) 0.1 - 1.0 K/uL   Eosinophils Relative 2 %   Eosinophils Absolute 0.3 0.0 - 0.5 K/uL   Basophils Relative 0 %   Basophils Absolute 0.0 0.0 - 0.1 K/uL   Immature Granulocytes 1 %   Abs Immature Granulocytes 0.06 0.00 - 0.07 K/uL  Brain natriuretic peptide     Status: Abnormal   Collection Time: 08/23/23  3:23 AM  Result Value Ref Range   B Natriuretic Peptide 457.4 (H) 0.0 - 100.0 pg/mL  Procalcitonin     Status: None   Collection Time: 08/23/23  3:23 AM  Result Value Ref Range   Procalcitonin 6.17 ng/mL  Lactic acid, plasma     Status: None   Collection Time: 08/23/23  4:38 AM  Result Value Ref Range   Lactic Acid, Venous 1.1 0.5 - 1.9 mmol/L  Prepare RBC (crossmatch)     Status: None   Collection Time: 08/23/23  5:22 AM  Result Value Ref Range   Order Confirmation      ORDER PROCESSED BY BLOOD BANK Performed at [facility], [address] [place], [place] [address]   Glucose, capillary     Status: Abnormal   Collection Time: 08/23/23  7:22 AM  Result Value Ref Range   Glucose-Capillary 137 (H) 70 - 99 mg/dL  Glucose, capillary     Status: Abnormal    Collection Time: 08/23/23 11:27 AM  Result Value Ref Range   Glucose-Capillary 116 (H) 70 - 99 mg/dL  Lactic acid, plasma     Status: None   Collection Time: 08/23/23 11:34 AM  Result Value Ref Range   Lactic Acid, Venous 1.1 0.5 - 1.9 mmol/L  Hemoglobin and hematocrit, blood     Status: Abnormal   Collection Time: 08/23/23 11:34 AM  Result Value Ref Range   Hemoglobin 8.9 (L) 13.0 - 17.0 g/dL   HCT 33.2 (L) 95.1 - 88.4 %    I have reviewed pertinent nursing notes, vitals, labs, and images as necessary. I have ordered labwork to follow up on as indicated.  I have reviewed the last notes from staff over past 24 hours. I have discussed patient's care plan and test results with nursing staff, CM/SW, and other staff as appropriate.  Time spent: Greater than 50% of the 55 minute visit was spent in counseling/coordination of care for the patient as laid out in the A&P.   LOS: 9 days   Lewie Chamber, MD [facility] 08/23/2023, 2:42 PM

## 2023-08-23 NOTE — Plan of Care (Signed)
  Problem: Role Relationship: Goal: Method of communication will improve Outcome: Progressing   Problem: Education: Goal: Knowledge of General Education information will improve Description: Including pain rating scale, medication(s)/side effects and non-pharmacologic comfort measures Outcome: Progressing   Problem: Nutrition: Goal: Adequate nutrition will be maintained Outcome: Progressing

## 2023-08-23 NOTE — Progress Notes (Signed)
Right upper extremity venous duplex has been completed. Preliminary results can be found in CV Proc through chart review.   08/23/23 12:34 PM Olen Cordial RVT

## 2023-08-24 DIAGNOSIS — R042 Hemoptysis: Secondary | ICD-10-CM

## 2023-08-24 DIAGNOSIS — K922 Gastrointestinal hemorrhage, unspecified: Secondary | ICD-10-CM | POA: Diagnosis not present

## 2023-08-24 DIAGNOSIS — Z930 Tracheostomy status: Secondary | ICD-10-CM | POA: Diagnosis not present

## 2023-08-24 DIAGNOSIS — R059 Cough, unspecified: Secondary | ICD-10-CM

## 2023-08-24 DIAGNOSIS — J9601 Acute respiratory failure with hypoxia: Secondary | ICD-10-CM | POA: Diagnosis not present

## 2023-08-24 LAB — GLUCOSE, CAPILLARY
Glucose-Capillary: 124 mg/dL — ABNORMAL HIGH (ref 70–99)
Glucose-Capillary: 125 mg/dL — ABNORMAL HIGH (ref 70–99)
Glucose-Capillary: 131 mg/dL — ABNORMAL HIGH (ref 70–99)
Glucose-Capillary: 118 mg/dL — ABNORMAL HIGH (ref 70–99)
Glucose-Capillary: 122 mg/dL — ABNORMAL HIGH (ref 70–99)
Glucose-Capillary: 163 mg/dL — ABNORMAL HIGH (ref 70–99)

## 2023-08-24 LAB — CBC WITH DIFFERENTIAL/PLATELET
Abs Immature Granulocytes: 0.04 10*3/uL (ref 0.00–0.07)
Basophils Absolute: 0 10*3/uL (ref 0.0–0.1)
Basophils Relative: 0 %
Eosinophils Absolute: 0.5 10*3/uL (ref 0.0–0.5)
Eosinophils Relative: 5 %
HCT: 25.5 % — ABNORMAL LOW (ref 39.0–52.0)
Hemoglobin: 8.1 g/dL — ABNORMAL LOW (ref 13.0–17.0)
Immature Granulocytes: 1 %
Lymphocytes Relative: 10 %
Lymphs Abs: 0.9 10*3/uL (ref 0.7–4.0)
MCH: 30.6 pg (ref 26.0–34.0)
MCHC: 31.8 g/dL (ref 30.0–36.0)
MCV: 96.2 fL (ref 80.0–100.0)
Monocytes Absolute: 0.8 10*3/uL (ref 0.1–1.0)
Monocytes Relative: 9 %
Neutro Abs: 6.6 10*3/uL (ref 1.7–7.7)
Neutrophils Relative %: 75 %
Platelets: 99 10*3/uL — ABNORMAL LOW (ref 150–400)
RBC: 2.65 MIL/uL — ABNORMAL LOW (ref 4.22–5.81)
RDW: 16.3 % — ABNORMAL HIGH (ref 11.5–15.5)
WBC: 8.8 10*3/uL (ref 4.0–10.5)
nRBC: 0 % (ref 0.0–0.2)

## 2023-08-24 MED ORDER — CLONAZEPAM 0.1 MG/ML ORAL SUSPENSION: 2.0000 mg | Freq: Three times a day (TID) | ORAL | Status: DC | PRN

## 2023-08-24 MED ORDER — CLONAZEPAM 0.5 MG PO TBDP
2.0000 mg | ORAL_TABLET | Freq: Three times a day (TID) | ORAL | Status: DC | PRN
  Administered 2023-08-26 – 2023-08-30 (×7): 2 mg
  Filled 2023-08-24 (×8): qty 4

## 2023-08-24 MED ORDER — LIDOCAINE HCL (PF) 2% IJ FOR NEBU
2.5000 mL | RESPIRATORY_TRACT | Status: DC | PRN
  Administered 2023-08-24 – 2023-08-29 (×6): 2.5 mL via RESPIRATORY_TRACT
  Filled 2023-08-24 (×7): qty 5

## 2023-08-24 MED ORDER — FENTANYL CITRATE PF 50 MCG/ML IJ SOSY
100.0000 ug | PREFILLED_SYRINGE | INTRAMUSCULAR | Status: DC | PRN
  Administered 2023-08-29 – 2023-09-15 (×12): 100 ug via INTRAVENOUS
  Filled 2023-08-24 (×8): qty 2

## 2023-08-24 MED ORDER — LORAZEPAM 2 MG/ML IJ SOLN
1.0000 mg | Freq: Once | INTRAMUSCULAR | Status: AC
  Administered 2023-08-24: 1 mg via INTRAVENOUS
  Filled 2023-08-24: qty 1

## 2023-08-24 MED ORDER — HYDROCODONE BIT-HOMATROP MBR 5-1.5 MG/5ML PO SOLN
5.0000 mL | ORAL | Status: DC | PRN
  Administered 2023-08-25: 5 mL via ORAL
  Filled 2023-08-24: qty 5

## 2023-08-24 NOTE — Progress Notes (Signed)
PCCM:  Called to see and evaluate for tracheal bleeding.  Family asked [place] not to see the patient yesterday  And today we are being asked to see the patient.   The bleeding is more like blood tinged secretions.  We spoke with the family at bedside at length We added cough suppressants, anti-anxiety meds.   Josephine Igo, DO [facility] 08/24/2023 6:54 PM

## 2023-08-24 NOTE — Progress Notes (Signed)
Patient desat to 66% at 1531, RN went to check on the pt, pt disconnect his ventilator from trach. RN immediately connect the ventilator to the trach. RN educate the patient about the importance of being on ventilator. Patient verbalized understanding. Will continue to monitor.

## 2023-08-24 NOTE — Progress Notes (Signed)
Physical Therapy Treatment Patient Details Name: Joshua Eaton MRN: [id number] DOB: 1955-09-12 Today's Date: 08/24/2023   History of Present Illness 67 y.o. male presents to [facility] hospital on 08/13/23 from [facility] with GI bleed. Pt also has red rash on his feet, left knee, and hands (work up underway for cause). LUE DVT from anticubital space to wrist. PMHx: colon CA s/p Rt hemicolectomy, MVR, COPD, PAF, LUE DVT, respiratory failure with trach, severe MR, PEG    PT Comments  Pt tolerates treatment well, sitting at bedside and performing multiple lateral scoots at edge of bed with assistance of PT. Pt continues to demonstrate a R lateral lean in sitting, intermittently able to correct, however more  challenging with fatigue. Pt remains at a high risk for falls due to imbalance and weakness. PT will continue to follow in the acute setting.    If plan is discharge home, recommend the following: Two people to help with walking and/or transfers;Two people to help with bathing/dressing/bathroom;Assistance with cooking/housework;Assist for transportation   Can travel by private vehicle        Equipment Recommendations  None recommended by PT    Recommendations for Other Services       Precautions / Restrictions Precautions Precautions: Fall Precaution Comments: trach collar, vent,  PEG, sacral wound Restrictions Weight Bearing Restrictions Per Provider Order: No     Mobility  Bed Mobility Overal bed mobility: Needs Assistance Bed Mobility: Rolling, Supine to Sit, Sit to Supine Rolling: Mod assist   Supine to sit: Mod assist Sit to supine: Mod assist        Transfers Overall transfer level: Needs assistance Equipment used: 1 person hand held assist Transfers: Sit to/from Stand Sit to Stand: Max assist           General transfer comment: partial sit to stand x 2 reps to scoot laterally at edge of bed    Ambulation/Gait                   Stairs              Wheelchair Mobility     Tilt Bed    Modified Rankin (Stroke Patients Only)       Balance Overall balance assessment: Needs assistance Sitting-balance support: Feet supported, Bilateral upper extremity supported Sitting balance-Leahy Scale: Poor Sitting balance - Comments: right lateral lean, can brielfy correct intermittently with verbal cues Postural control: Right lateral lean Standing balance support: Bilateral upper extremity supported Standing balance-Leahy Scale: Poor Standing balance comment: maxA                            Cognition Arousal: Alert Behavior During Therapy: Anxious Overall Cognitive Status: Impaired/Different from baseline Area of Impairment: Following commands                       Following Commands: Follows one step commands with increased time, Follows multi-step commands inconsistently                Exercises      General Comments General comments (skin integrity, edema, etc.): trach to vent 40% FiO2, rectal tube leaking, PT assists in hygiene tasks      Pertinent Vitals/Pain Pain Assessment Pain Assessment: CPOT Facial Expression: Tense Body Movements: Absence of movements Muscle Tension: Tense, rigid Compliance with ventilator (intubated pts.): Tolerating ventilator or movement Vocalization (extubated pts.): N/A CPOT Total: 2    Home  Living                          Prior Function            PT Goals (current goals can now be found in the care plan section) Acute Rehab PT Goals Patient Stated Goal: n/a Progress towards PT goals: Progressing toward goals (slowly)    Frequency    Min 1X/week      PT Plan      Co-evaluation              AM-PAC PT "6 Clicks" Mobility   Outcome Measure  Help needed turning from your back to your side while in a flat bed without using bedrails?: A Lot Help needed moving from lying on your back to sitting on the side of a flat bed without  using bedrails?: A Lot Help needed moving to and from a bed to a chair (including a wheelchair)?: A Lot Help needed standing up from a chair using your arms (e.g., wheelchair or bedside chair)?: A Lot Help needed to walk in hospital room?: Total Help needed climbing 3-5 steps with a railing? : Total 6 Click Score: 10    End of Session Equipment Utilized During Treatment: Oxygen Activity Tolerance: Patient tolerated treatment well Patient left: in bed;with call bell/phone within reach;with nursing/sitter in room Nurse Communication: Mobility status;Need for lift equipment PT Visit Diagnosis: Muscle weakness (generalized) (M62.81);Unsteadiness on feet (R26.81);Difficulty in walking, not elsewhere classified (R26.2);Pain     Time: 1350-1429 PT Time Calculation (min) (ACUTE ONLY): 39 min  Charges:    $Therapeutic Activity: 23-37 mins PT General Charges $$ ACUTE PT VISIT: 1 Visit                     [name], PT, DPT Acute Rehabilitation Office [phone]    [name] 08/24/2023, 2:45 PM

## 2023-08-24 NOTE — Progress Notes (Signed)
Physical Therapy Wound Treatment and Discharge Patient Details  Name: Joshua Eaton MRN: [id number] Date of Birth: [birth year]  Today's Date: 08/24/2023 Time: 7253-6644 Time Calculation (min): 29 min  Subjective  Subjective Assessment Subjective: Trach on vent; nods yes/no and tries to mouth words but difficult to understand Patient and Family Stated Goals: wife present and wants it to heal without need for further surgery Date of Onset:  (unknown) Prior Treatments: per wife, plastics did debridement previously; NS wet to dry dressings  Pain Score:   Unable to rate; no apparent pain after pre-medication   Wound Assessment  Pressure Injury 08/14/23 Sacrum Posterior;Medial Unstageable - Full thickness tissue loss in which the base of the injury is covered by slough (yellow, tan, gray, green or brown) and/or eschar (tan, brown or black) in the wound bed. (Active)  Wound Image  Unable to get photo due to internect connectivity issue  Dressing Type Foam - Lift dressing to assess site every shift;Gauze (Comment);Honey;Barrier Film (skin prep) 08/24/23 1018  Dressing Changed 08/24/23 1018  Dressing Change Frequency Twice a day 08/24/23 1018  State of Healing Early/partial granulation 08/24/23 1018  Site / Wound Assessment Yellow;Red;Pink 08/24/23 1018  % Wound base Red or Granulating 85% 08/24/23 1018  % Wound base Yellow/Fibrinous Exudate 15% 08/24/23 1018  % Wound base Black/Eschar 0% 08/24/23 1018  % Wound base Other/Granulation Tissue (Comment) 0% 08/20/23 1001  Peri-wound Assessment Maceration;Pink;Purple 08/24/23 1018  Wound Length (cm) 4.7 cm 08/16/23 1124  Wound Width (cm) 4.2 cm 08/16/23 1124  Wound Depth (cm) 3.1 cm 08/16/23 1124  Wound Surface Area (cm^2) 19.74 cm^2 08/16/23 1124  Wound Volume (cm^3) 61.19 cm^3 08/16/23 1124  Margins Unattached edges (unapproximated) 08/24/23 1018  Drainage Amount Moderate 08/24/23 1018  Drainage Description Serous 08/24/23 1018  Treatment  Debridement (Selective);Irrigation 08/24/23 1018   Selective Debridement (non-excisional) Selective Debridement (non-excisional) - Location: sacrum Selective Debridement (non-excisional) - Tools Used: Forceps, Scalpel Selective Debridement (non-excisional) - Tissue Removed: yellow slough and adherent yellow tissue    Wound Assessment and Plan  Wound Therapy - Assess/Plan/Recommendations Wound Therapy - Clinical Statement: Patient has made good progress with goals met (wound now 85% granulation/red). Patient weaning on vent, despite asking RN if he could be on full support to tolerate positioning. Patient with lots of coughing and sats dropping into mid-80s resulting in need for hurried session and measurements not obtained. Discharge from hydrotherapy. Wound Therapy - Functional Problem List: weak with limited mobility Factors Delaying/Impairing Wound Healing: Incontinence, Immobility, Multiple medical problems Hydrotherapy Plan: Debridement, Dressing change, Other (comment) (irrigation) Wound Therapy - Frequency: 2X / week (Tues, Friday) Wound Therapy - Follow Up Recommendations: dressing changes by RN  Wound Therapy Goals- Improve the function of patient's integumentary system by progressing the wound(s) through the phases of wound healing (inflammation - proliferation - remodeling) by: Wound Therapy Goals - Improve the function of patient's integumentary system by progressing the wound(s) through the phases of wound healing by: Decrease Necrotic Tissue to: 15% Decrease Necrotic Tissue - Progress: Met Increase Granulation Tissue to: 85% Increase Granulation Tissue - Progress: Met Decrease Length/Width/Depth by (cm): --/--/0.3 Decrease Length/Width/Depth - Progress: Partly met Improve Drainage Characteristics: Min Improve Drainage Characteristics - Progress: Partly met Goals/treatment plan/discharge plan were made with and agreed upon by patient/family: Yes Time For Goal Achievement: 7  days Wound Therapy - Potential for Goals: Good  Goals will be updated until maximal potential achieved or discharge criteria met.  Discharge criteria: when goals achieved, discharge from hospital, MD  decision/surgical intervention, no progress towards goals, refusal/missing three consecutive treatments without notification or medical reason.  PT Discharge Note  Patient is being discharged from PT services secondary to:  Goals met and no further therapy needs identified.  Please see latest Therapy Progress Note for current level of functioning and progress toward goals.  Progress and discharge plan and discussed with patient/caregiver and they  Agree  GP     Charges PT Wound Care Charges $Wound Debridement up to 20 cm: < or equal to 20 cm $PT Hydrotherapy Dressing: 1 dressing $PT Hydrotherapy Visit: 1 Visit      [name], PT Acute Rehabilitation Services  Office [phone]   [name] 08/24/2023, 10:24 AM

## 2023-08-24 NOTE — Plan of Care (Signed)
  Problem: Activity: Goal: Ability to tolerate increased activity will improve Outcome: Progressing   Problem: Respiratory: Goal: Ability to maintain a clear airway and adequate ventilation will improve Outcome: Progressing   Problem: Education: Goal: Knowledge of General Education information will improve Description: Including pain rating scale, medication(s)/side effects and non-pharmacologic comfort measures Outcome: Progressing

## 2023-08-24 NOTE — Plan of Care (Signed)
  Problem: Activity: Goal: Ability to tolerate increased activity will improve Outcome: Progressing   Problem: Respiratory: Goal: Ability to maintain a clear airway and adequate ventilation will improve Outcome: Progressing   Problem: Nutrition: Goal: Adequate nutrition will be maintained Outcome: Progressing   Problem: Coping: Goal: Level of anxiety will decrease Outcome: Progressing   Problem: Activity: Goal: Ability to tolerate increased activity will improve Outcome: Progressing   Problem: Respiratory: Goal: Ability to maintain a clear airway and adequate ventilation will improve Outcome: Progressing   Problem: Nutrition: Goal: Adequate nutrition will be maintained Outcome: Progressing   Problem: Coping: Goal: Level of anxiety will decrease Outcome: Progressing   Problem: Activity: Goal: Ability to tolerate increased activity will improve Outcome: Progressing   Problem: Respiratory: Goal: Ability to maintain a clear airway and adequate ventilation will improve Outcome: Progressing   Problem: Nutrition: Goal: Adequate nutrition will be maintained Outcome: Progressing   Problem: Coping: Goal: Level of anxiety will decrease Outcome: Progressing

## 2023-08-24 NOTE — Progress Notes (Signed)
eLink Physician-Brief Progress Note Patient Name: Joshua Eaton DOB: 1956/02/03 MRN: [id number]   Date of Service  08/24/2023  HPI/Events of Note  As per RT; Had been alarming high peak pressures on the vetn most of the night.  RT was able to make some adjustments and he looks more comfy. Can we match up the orders?? They weren't even wha the was on to begin with.  Currently (after changes)  TV 540 (7cc) Peep 8 rate.18 Previously had been on 8cc  Camera : As above at fio2 at 65%. P peak 35. Ve 10 lit. In synchrony. No auto peep on wave form. HR 75, 99% sats. MAP > 65  eICU Interventions  Continue care. Follow ABG at 2 AM once.      Intervention Category Intermediate Interventions: Respiratory distress - evaluation and management  Ranee Gosselin 08/24/2023, 11:54 PM

## 2023-08-24 NOTE — Progress Notes (Signed)
NAME:  Joshua Eaton, MRN:  [id number], DOB:  [birth year], LOS: 10 ADMISSION DATE:  08/13/2023, CONSULTATION DATE:  12/13 REFERRING MD:  Dr. Eloise Harman, CHIEF COMPLAINT:  GIB   History of Present Illness:  Patient is a 67 yo M w/ pertinent PMH chronic respiratory failure w/ tracheostomy/[facility], Afib on eliquis presents to [facility] ED on 12/13 w/ GIB.  Patient recently admitted w/ respiratory failure and pna in October 2024 requiring tracheostomy. He was later transferred to [facility] on 10/28. Later that day had an accidental trach dislodgement. His wob continued to increase requiring re-admission to [facility] on 10/31 and was intubated and later trached during hospital admission. Patient discharged on 11/12 [facility].   On 12/13 patient admitted to [facility] w/ drop in hgb and concern for GIB. GI consulted for possible endoscopy. TRH to admit and PCCM to consult for vent management.  Pertinent  Medical History   Past Medical History:  Diagnosis Date   Anxiety    Cancer (HCC)    Depression    Hypertension    OCD (obsessive compulsive disorder)    Significant Hospital Events: Including procedures, antibiotic start and stop dates in addition to other pertinent events   12/13 admitted w/ GIB. Pccm to consult for vent management 12/14 1 unit pRBC 12/15 EGD - erosion around PEG site but no active bleeding; cratered ulcer biopsied near peg site.  12/16 plugging with clots- posterior wall bleeding from distal end of trach 12/23 family fired pccm 12/24 pccm asked to re-engage by ENT via RN staff .   Interim History / Subjective:  Asked to re-engage regarding trach/cough at request of ENT.   Objective   Blood pressure (!) 126/57, pulse 94, temperature 99.8 F (37.7 C), temperature source Axillary, resp. rate 17, height 6' 0.99" (1.854 m), weight 91.3 kg, SpO2 97%.    Vent Mode: PRVC FiO2 (%):  [40 %-75 %] 75 % Set Rate:  [18 bmp] 18 bmp Vt Set:  [640 mL] 640  mL PEEP:  [5 cmH20-8 cmH20] 8 cmH20 Pressure Support:  [12 cmH20] 12 cmH20   Intake/Output Summary (Last 24 hours) at 08/24/2023 1847 Last data filed at 08/24/2023 1805 Gross per 24 hour  Intake 2175.04 ml  Output 2700 ml  Net -524.96 ml   Filed Weights   08/22/23 1951 08/23/23 0134 08/24/23 0500  Weight: 90 kg 90 kg 91.3 kg    Examination: General: Ill appearing older adult M NAD  HEENT:  trach secure, blood tinged secretions  Neuro:  recently given anxiolytics  CV: rrr  PULM:  synchronous w vent  WU:[id number]  Extremities: No obvious acute joint deformity   Resolved Hospital Problem list   Hypokalemia HTN AKI  Assessment & Plan:    Chronic resp failure Trach/vent dependence Hemoptysis Cough - secondary to irritation of his trach on the posterior tracheal wall - Baseline: Up to 20h TC at [facility], family thinks panic attacks have limited vent weaning. Has been deemed chronic vent dependent. Continue to wean as tolerated.  P -adding lido nebs for cough  -Would also encourage using some of his PRN anxiolytics, as well as some opioid medication  -spoke w family at length re expectations re degree of cough, hemoptysis. Fortunately when ENT looked 12/23 there was no active bleed. On eval 12/24 volume of blood tinged secretions is not significant enough to warrant things like txa.  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  Mild hyperglycemia Unstagable  pressure ulcer on sacrum POA Moderate protein energy malnutrition  Severe deconditioning  Diarrhea -per primary   Best practice:  Diet: TF Pain/Anxiety/Delirium protocol (if indicated): N/A VAP protocol (if indicated): In place DVT prophylaxis: SCD GI prophylaxis: PPI Glucose control: N/A Mobility: turn, progress as tolerated Sacral wound stage 4+, POA Code Status: full  Family Communication: wife updated at bedside  Disposition: Remains in ICU due to vent dependence  Low MDM  Tessie Fass MSN, AGACNP-BC [facility] Pulmonary/Critical Care Medicine 08/24/2023, 6:47 PM

## 2023-08-24 NOTE — Progress Notes (Signed)
Progress Note    Joshua Eaton   WJX:[id number]  DOB: 06-16-1956  DOA: 08/13/2023     10 PCP: [name], MD  Initial CC: GIB  Hospital Course: [name] is a 67 yo M w/ pertinent PMH chronic respiratory failure w/ tracheostomy/[facility], Afib on eliquis presented to [facility] ED on 12/13 w/ GIB.   Patient recently admitted w/ respiratory failure and pna in October 2024 requiring tracheostomy initially on 06/18/23. He was later transferred to [facility] on 10/28. Later that day had an accidental trach dislodgement. His wob continued to increase requiring re-admission to [facility] on 10/31 and was intubated and then underwent redo trach on 07/05/23.  He was noted to have some tracheal ring damage and has had intermittent bleeding with clots from suction. Patient discharged on 11/[facility].    On 12/13 patient admitted to [facility] w/ drop in hgb and concern for GIB. GI consulted for possible endoscopy.  Patient underwent EGD on 08/15/2023 showing duodenitis and mucosal changes in the gastric antrum concerning for ulcer as well.  Bronchoscopy performed 08/16/2023 with small clots removed from distal end of trach; trach tip close proximity to membranous posterior wall; Bivona adjustable length trach was ordered.   Interval History:  No events overnight.  Reassuring exam with ENT yesterday on 08/23/2023.  No active bleeding or significant erosions noted during fiberoptic tracheoscopy.  Assessment and Plan:  Fever - patient became febrile on 12/22 night and WBC uptrended on 12/23 -Fever has now defervesced and WBC normalized on 08/24/2023 -CXR performed on 08/23/2023 showing bibasilar opacities concerning for atelectasis versus infiltrates - Procalcitonin also elevated from prior values - Follow-up blood cultures; NGTD - Continue Vanco and Zosyn for 5 days then stop; monitor renal function while on combo  ABLA 2/2 UGIB/ duodenitis - CTA showed  active bleeding from gastric antrum/ pylorus with duodenitis -gastric biopsy negative -con't high dose PPI indefinitely per GI, especially since he will eventually need to re-trial DOAC - Home apixaban held for now and will need to monitor Hgb and for evidence of bleeding again once eventually resumed -transfuse for Hb <7 or hemodynamically significant bleeding   Hx RUE DVT 06/16/23 - Eliquis on hold until Hgb stabilizes - repeat RUE duplex performed 12/23: no further DVT noted (prior RUE DVT noted on 10/16 initially) -Left upper extremity duplex from 08/14/2023 showed age-indeterminate SVT involving left cephalic vein, no DVT  Chronic respiratory failure Trach dependency Hemoptysis secondary to irritation of his trach on the posterior tracheal wall s/p bronch 12/16 - Baseline: Up to 20h TC at [facility], family thinks panic attacks have limited vent weaning. Has been deemed chronic vent dependent. Continue to wean as tolerated.  - Received TXA this admission, hold further doses - Awaiting Bivona trach; on order  - Trach care per protocol - Gabapentin for cough, tolerating well  - s/p ENT eval for fiberoptic tracheoscopy on 08/23/2023; mild streaks of mucus lining tracheal wall but no bleeding or significant erosions noted -If worsening anemia or further worsening of bloody sputum from trach, may need repeat bronchoscopy  AKI - resolved  -strict I/O -renally dose meds, avoid nephrotoxic meds.    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  Remus Loffler at bedtime    Rash on knee-- ?ringworm Infectious disease followed - overall improving  -contact precautions -topical antifungal x 2 weeks total   Petechial rash on hands and feet; blood cultures and serologies negative so far -Appreciate ID's input; workup unrevealing so far. ID team feels clinical appearance most suggestive of drug/abx related rash.  -Blood cultures remain negative -Overall resolving   Thrombocytopenia- unclear etiology. Not on antibiotics. With PPI low risk -monitor   Mild hyperglycemia - likely from TF -A1c 4.3% on 07/14/2023 - SSI PRN - goal BG 140-180   Unstagable pressure ulcer on sacrum POA -frequent turns -optimize nutrition, con't TF -wound care following   Moderate protein energy malnutrition  -TF   Severe deconditioning  -PT, OT   Diarrhea -- Discontinue stool softeners/bowel reg   Old records reviewed in assessment of this patient  Antimicrobials:   DVT prophylaxis:  SCDs Start: 08/13/23 2049   Code Status:   Code Status: Full Code  Mobility Assessment (Last 72 Hours)     Mobility Assessment     Row Name 08/22/23 2000 08/21/23 2000         Does patient have an order for bedrest or is patient medically unstable No - Continue assessment No - Continue assessment      What is the highest level of mobility based on the progressive mobility assessment? Level 2 (Chairfast) - Balance while sitting on edge of bed and cannot stand Level 2 (Chairfast) - Balance while sitting on edge of bed and cannot stand      Is the above level different from baseline mobility prior to current illness? Yes - Recommend PT order Yes - Recommend PT order               Barriers to discharge: awaiting LTAC placement but now denied by insurance  Disposition Plan:  TBD Status is: Inpt  Objective: Blood pressure (!) 141/80, pulse 84, temperature 98.5 F (36.9 C), temperature source Oral, resp. rate 19, height 6' 0.99" (1.854 m), weight 91.3 kg, SpO2 92%.   Examination:  Physical Exam Constitutional:      General: He is not in acute distress.    Appearance: Normal appearance.  HENT:     Head: Normocephalic and atraumatic.     Mouth/Throat:     Mouth: Mucous membranes are moist.  Eyes:     Extraocular Movements: Extraocular movements intact.  Neck:     Trachea: Tracheostomy present.     Comments: Tracheostomy tube in place Cardiovascular:     Rate and Rhythm: Normal rate and regular rhythm.  Pulmonary:     Effort: Pulmonary effort is normal. No respiratory distress.     Breath sounds: Normal breath sounds. No wheezing.  Abdominal:     General: Bowel sounds are normal. There is no distension.     Palpations: Abdomen is soft.     Tenderness: There is no abdominal tenderness.     Comments: PEG tube in place  Musculoskeletal:        General: Swelling (RUE 2-3+) present. No tenderness. Normal range of motion.     Cervical back: Normal range of motion and neck supple.  Skin:    Comments: B/L knee scattered ertheyma without raised borders; no pain nor pruritus. Improving skin discolorations in B/L LE  Neurological:  Mental Status: He is alert.     Comments: Weaker in right upper and lower extremity (4/5) compared to left side   Psychiatric:        Mood and Affect: Mood normal.        Behavior: Behavior normal.      Consultants:  Pulmonology  ENT eval on 12/23  Procedures:    Data Reviewed: Results for orders placed or performed during the hospital encounter of 08/13/23 (from the past 24 hours)  Glucose, capillary     Status: Abnormal   Collection Time: 08/23/23  3:16 PM  Result Value Ref Range   Glucose-Capillary 117 (H) 70 - 99 mg/dL  Glucose, capillary     Status: Abnormal   Collection Time: 08/23/23  7:25 PM  Result Value Ref Range   Glucose-Capillary 118 (H) 70 - 99 mg/dL  Glucose, capillary     Status: Abnormal   Collection Time: 08/23/23 11:30 PM  Result Value Ref Range   Glucose-Capillary 119 (H) 70 - 99  mg/dL  Glucose, capillary     Status: Abnormal   Collection Time: 08/24/23  3:13 AM  Result Value Ref Range   Glucose-Capillary 124 (H) 70 - 99 mg/dL  CBC with Differential/Platelet     Status: Abnormal   Collection Time: 08/24/23  6:22 AM  Result Value Ref Range   WBC 8.8 4.0 - 10.5 K/uL   RBC 2.65 (L) 4.22 - 5.81 MIL/uL   Hemoglobin 8.1 (L) 13.0 - 17.0 g/dL   HCT 56.3 (L) 87.5 - 64.3 %   MCV 96.2 80.0 - 100.0 fL   MCH 30.6 26.0 - 34.0 pg   MCHC 31.8 30.0 - 36.0 g/dL   RDW 32.9 (H) 51.8 - 84.1 %   Platelets 99 (L) 150 - 400 K/uL   nRBC 0.0 0.0 - 0.2 %   Neutrophils Relative % 75 %   Neutro Abs 6.6 1.7 - 7.7 K/uL   Lymphocytes Relative 10 %   Lymphs Abs 0.9 0.7 - 4.0 K/uL   Monocytes Relative 9 %   Monocytes Absolute 0.8 0.1 - 1.0 K/uL   Eosinophils Relative 5 %   Eosinophils Absolute 0.5 0.0 - 0.5 K/uL   Basophils Relative 0 %   Basophils Absolute 0.0 0.0 - 0.1 K/uL   Immature Granulocytes 1 %   Abs Immature Granulocytes 0.04 0.00 - 0.07 K/uL  Glucose, capillary     Status: Abnormal   Collection Time: 08/24/23  7:17 AM  Result Value Ref Range   Glucose-Capillary 125 (H) 70 - 99 mg/dL  Glucose, capillary     Status: Abnormal   Collection Time: 08/24/23 11:03 AM  Result Value Ref Range   Glucose-Capillary 131 (H) 70 - 99 mg/dL    I have reviewed pertinent nursing notes, vitals, labs, and images as necessary. I have ordered labwork to follow up on as indicated.  I have reviewed the last notes from staff over past 24 hours. I have discussed patient's care plan and test results with nursing staff, CM/SW, and other staff as appropriate.  Time spent: Greater than 50% of the 55 minute visit was spent in counseling/coordination of care for the patient as laid out in the A&P.   LOS: 10 days   Lewie Chamber, MD [facility] 08/24/2023, 12:20 PM

## 2023-08-25 DIAGNOSIS — K922 Gastrointestinal hemorrhage, unspecified: Secondary | ICD-10-CM | POA: Diagnosis not present

## 2023-08-25 LAB — CBC WITH DIFFERENTIAL/PLATELET
Abs Immature Granulocytes: 0.06 10*3/uL (ref 0.00–0.07)
Basophils Absolute: 0 10*3/uL (ref 0.0–0.1)
Basophils Relative: 0 %
Eosinophils Absolute: 0.1 10*3/uL (ref 0.0–0.5)
Eosinophils Relative: 1 %
HCT: 22.7 % — ABNORMAL LOW (ref 39.0–52.0)
Hemoglobin: 7.3 g/dL — ABNORMAL LOW (ref 13.0–17.0)
Immature Granulocytes: 1 %
Lymphocytes Relative: 8 %
Lymphs Abs: 0.7 10*3/uL (ref 0.7–4.0)
MCH: 30.5 pg (ref 26.0–34.0)
MCHC: 32.2 g/dL (ref 30.0–36.0)
MCV: 95 fL (ref 80.0–100.0)
Monocytes Absolute: 0.7 10*3/uL (ref 0.1–1.0)
Monocytes Relative: 8 %
Neutro Abs: 7.1 10*3/uL (ref 1.7–7.7)
Neutrophils Relative %: 82 %
Platelets: 91 10*3/uL — ABNORMAL LOW (ref 150–400)
RBC: 2.39 MIL/uL — ABNORMAL LOW (ref 4.22–5.81)
RDW: 15.7 % — ABNORMAL HIGH (ref 11.5–15.5)
WBC: 8.6 10*3/uL (ref 4.0–10.5)
nRBC: 0 % (ref 0.0–0.2)

## 2023-08-25 LAB — BASIC METABOLIC PANEL
Anion gap: 8 (ref 5–15)
BUN: 50 mg/dL — ABNORMAL HIGH (ref 8–23)
CO2: 29 mmol/L (ref 22–32)
Calcium: 8.2 mg/dL — ABNORMAL LOW (ref 8.9–10.3)
Chloride: 98 mmol/L (ref 98–111)
Creatinine, Ser: 1.25 mg/dL — ABNORMAL HIGH (ref 0.61–1.24)
GFR, Estimated: >60 mL/min (ref >60)
Glucose, Bld: 138 mg/dL — ABNORMAL HIGH (ref 70–99)
Potassium: 4 mmol/L (ref 3.5–5.1)
Sodium: 135 mmol/L (ref 135–145)

## 2023-08-25 LAB — POCT I-STAT 7, (LYTES, BLD GAS, ICA,H+H)
Acid-Base Excess: 6 mmol/L — ABNORMAL HIGH (ref 0.0–2.0)
Bicarbonate: 31.7 mmol/L — ABNORMAL HIGH (ref 20.0–28.0)
Calcium, Ion: 1.22 mmol/L (ref 1.15–1.40)
HCT: 24 % — ABNORMAL LOW (ref 39.0–52.0)
Hemoglobin: 8.2 g/dL — ABNORMAL LOW (ref 13.0–17.0)
O2 Saturation: 100 %
Patient temperature: 99.4
Potassium: 4.2 mmol/L (ref 3.5–5.1)
Sodium: 137 mmol/L (ref 135–145)
TCO2: 33 mmol/L — ABNORMAL HIGH (ref 22–32)
pCO2 arterial: 54.5 mm[Hg] — ABNORMAL HIGH (ref 32–48)
pH, Arterial: 7.374 (ref 7.35–7.45)
pO2, Arterial: 180 mm[Hg] — ABNORMAL HIGH (ref 83–108)

## 2023-08-25 LAB — PREPARE RBC (CROSSMATCH)

## 2023-08-25 LAB — GLUCOSE, CAPILLARY
Glucose-Capillary: 173 mg/dL — ABNORMAL HIGH (ref 70–99)
Glucose-Capillary: 120 mg/dL — ABNORMAL HIGH (ref 70–99)
Glucose-Capillary: 123 mg/dL — ABNORMAL HIGH (ref 70–99)
Glucose-Capillary: 108 mg/dL — ABNORMAL HIGH (ref 70–99)
Glucose-Capillary: 129 mg/dL — ABNORMAL HIGH (ref 70–99)
Glucose-Capillary: 126 mg/dL — ABNORMAL HIGH (ref 70–99)

## 2023-08-25 LAB — PROCALCITONIN: Procalcitonin: 11.25 ng/mL

## 2023-08-25 LAB — MAGNESIUM: Magnesium: 2.2 mg/dL (ref 1.7–2.4)

## 2023-08-25 MED ORDER — HYDROCODONE BIT-HOMATROP MBR 5-1.5 MG/5ML PO SOLN
5.0000 mL | ORAL | Status: DC | PRN
  Administered 2023-08-26 – 2023-09-12 (×6): 5 mL
  Filled 2023-08-25 (×6): qty 5

## 2023-08-25 MED ORDER — LINEZOLID 600 MG/300ML IV SOLN
600.0000 mg | Freq: Two times a day (BID) | INTRAVENOUS | Status: AC
  Administered 2023-08-25 – 2023-08-30 (×10): 600 mg via INTRAVENOUS
  Filled 2023-08-25 (×10): qty 300

## 2023-08-25 MED ORDER — SODIUM CHLORIDE 0.9% IV SOLUTION: Freq: Once | INTRAVENOUS | Status: DC

## 2023-08-25 NOTE — Progress Notes (Signed)
PROGRESS NOTE    Joshua Eaton  VHQ:[id number] DOB: 1956-07-18 DOA: 08/13/2023 PCP: [name], MD  Brief Narrative:   67 yo M w/ pertinent PMH chronic respiratory failure w/ tracheostomy/[facility], Afib on eliquis presented to [facility] ED on 12/13 w/ GIB.   Patient recently admitted w/ respiratory failure and pna in October 2024 requiring tracheostomy initially on 06/18/23. He was later transferred to [facility] on 10/[facility]. Later that day had an accidental trach dislodgement. His wob continued to increase requiring re-admission to [facility] on 10/31 and was intubated and then underwent redo trach on 07/05/23.  He was noted to have some tracheal ring damage and has had intermittent bleeding with clots from suction. Patient [facility].    On 12/13 patient admitted to [facility] w/ drop in hgb and concern for GIB. GI consulted for possible endoscopy.  Patient underwent EGD on 08/15/2023 showing duodenitis and mucosal changes in the gastric antrum concerning for ulcer as well.   Bronchoscopy performed 08/16/2023 with small clots removed from distal end of trach; trach tip close proximity to membranous posterior wall; Bivona adjustable length trach was ordered.  Assessment & Plan:   Principal Problem:   Acute GI bleeding Active Problems:   Acute respiratory failure with hypoxia (HCC)   Tracheostomy in place [facility])   Acute post-hemorrhagic anemia   PEG (percutaneous endoscopic gastrostomy) status (HCC)   Paroxysmal atrial fibrillation (HCC)   DVT (deep venous thrombosis) (HCC)   AKI (acute kidney injury) (HCC)   Sacral ulcer (HCC)   Severe mitral regurgitation   On mechanically assisted ventilation (HCC)   Tracheostomy dependence (HCC)   Moderate aortic insufficiency   Malnutrition of moderate degree   HTN (hypertension)   Rash   Upper GI bleed   Abnormal finding on GI tract imaging   Cough   Hemoptysis   Fever - patient became  febrile on 12/22 night and WBC uptrended on 12/23 -Fever has now defervesced and WBC normalized on 08/24/2023 -CXR performed on 08/23/2023 showing bibasilar opacities concerning for atelectasis versus infiltrates - Procalcitonin also elevated from prior values - Follow-up blood cultures; NGTD -With rising creatinine will DC vancomycin and start Zyvox continue Zosyn.  ABLA 2/2 UGIB/ duodenitis - CTA showed active bleeding from gastric antrum/ pylorus with duodenitis -gastric biopsy negative -con't high dose PPI indefinitely per GI, especially since he will eventually need to re-trial DOAC - Home apixaban held for now and will need to monitor Hgb and for evidence of bleeding again once eventually resumed Transfuse 1 unit of packed RBC on 08/25/2023   Hx RUE DVT 06/16/23 - Eliquis on hold until Hgb stabilizes - repeat RUE duplex performed 12/23: no further DVT noted (prior RUE DVT noted on 10/16 initially) -Left upper extremity duplex from 08/14/2023 showed age-indeterminate SVT involving left cephalic vein, no DVT   Chronic respiratory failure Trach dependency Hemoptysis secondary to irritation of his trach on the posterior tracheal wall s/p bronch 12/16 - Baseline: Up to 20h TC at [facility], family thinks panic attacks have limited vent weaning. Has been deemed chronic vent dependent. Continue to wean as tolerated.  - Received TXA this admission, hold further doses - Awaiting Bivona trach; on order  - Trach care per protocol - Gabapentin for cough, tolerating well  - s/p ENT eval for fiberoptic tracheoscopy on 08/23/2023; mild streaks of mucus lining tracheal wall but no bleeding or significant erosions noted  AKI -now on vancomycin and Zosyn with  uptrending creatinine monitor closely   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 - not operative candidate until significant rehabilitation   Paroxysmal Afib -Con't amio --Hold DOAC as above    Hyperbilirubinemia - no evidence of  occlusive gallstones causing ductal dilation ARDS quadrant ultrasound, previously has had cholecystectomy   Anxiety  - ativan as needed; decreased back again to q8h PRN - con't seroquel and sertraline - con't gabapentin for coughing; if too sedating can just use at bedtime.  Remus Loffler at bedtime   Rash on knee-- ?ringworm Infectious disease followed - overall improving  -contact precautions -topical antifungal x 2 weeks total   Petechial rash on hands and feet; blood cultures and serologies negative so far -Appreciate ID's input; workup unrevealing so far. ID team feels clinical appearance most suggestive of drug/abx related rash.  -Blood cultures remain negative -Overall resolving   Thrombocytopenia- unclear etiology. Not on antibiotics. With PPI low risk -monitor   Mild hyperglycemia - likely from TF -A1c 4.3% on 07/14/2023 - SSI PRN - goal BG 140-180   Unstagable pressure ulcer on sacrum POA -frequent turns -optimize nutrition, con't TF -wound care following    Moderate protein energy malnutrition  -TF   Severe deconditioning  -PT, OT   Diarrhea -- Discontinue stool softeners/bowel reg     Old records reviewed in assessment of this patient   Antimicrobials:     DVT prophylaxis:  SCDs Start: 08/13/23 2049     Code Status:   Code Status: Full Code  Pressure Injury 08/14/23 Sacrum Posterior;Medial Unstageable - Full thickness tissue loss in which the base of the injury is covered by slough (yellow, tan, gray, green or brown) and/or eschar (tan, brown or black) in the wound bed. (Active)  08/14/23 0951  Location: Sacrum  Location Orientation: Posterior;Medial  Staging: Unstageable - Full thickness tissue loss in which the base of the injury is covered by slough (yellow, tan, gray, green or brown) and/or eschar (tan, brown or black) in the wound bed.  Wound Description (Comments):   Present on Admission: Yes  Dressing Type Foam - Lift dressing to assess site  every shift 08/25/23 0800    Nutrition Problem: Moderate Malnutrition Etiology: chronic illness  Signs/Symptoms: severe muscle depletion, moderate muscle depletion, mild fat depletion  Interventions: Tube feeding, Refer to RD note for recommendations  Estimated body mass index is 26.47 kg/m as calculated from the following:   Height as of this encounter: 6' 0.99" (1.854 m).   Weight as of this encounter: 91 kg.  DVT prophylaxis: None none due to bleeding and requirement of blood transfusions  code Status: Full code Family Communication: Wife at bedside  disposition Plan:  Status is: Inpatient Remains inpatient appropriate because: Acute illness   Consultants:  PCCM and ENT  Procedures: EGD on 08/15/2023  Bronchoscopy 08/16/2023  Fiberoptic  tracheoscopy 08/23/2023   Antimicrobials:  Anti-infectives (From admission, onward)    Start     Dose/Rate Route Frequency Ordered Stop   08/23/23 2000  vancomycin (VANCOCIN) IVPB 1000 mg/200 mL premix        1,000 mg 200 mL/hr over 60 Minutes Intravenous Every 12 hours 08/23/23 0531     08/23/23 0730  vancomycin (VANCOCIN) IVPB 1000 mg/200 mL premix       Placed in "Followed by" Linked Group   1,000 mg 200 mL/hr over 60 Minutes Intravenous  Once 08/23/23 0531 08/23/23 0854   08/23/23 0630  piperacillin-tazobactam (ZOSYN) IVPB 3.375 g  3.375 g 12.5 mL/hr over 240 Minutes Intravenous Every 8 hours 08/23/23 0531     08/23/23 0630  vancomycin (VANCOCIN) IVPB 1000 mg/200 mL premix       Placed in "Followed by" Linked Group   1,000 mg 200 mL/hr over 60 Minutes Intravenous  Once 08/23/23 0531 08/23/23 0720        Subjective: Wife at bedside and reports patient has been coughing, creatinine 1.25 from 1.03 wife at bedside, white count 8.6, hemoglobin 7.3 Last Pro-Cal was 6.17 on 08/23/2023  Objective: Vitals:   08/25/23 0800 08/25/23 1000 08/25/23 1007 08/25/23 1022  BP: (!) 112/56 (!) 111/53  (!) 107/55  Pulse: 67 65 66 65   Resp: 18 18 18 18   Temp:  (!) 97.2 F (36.2 C) (!) 97.2 F (36.2 C) (!) 97.3 F (36.3 C)  TempSrc:  Axillary Axillary Axillary  SpO2: 100% 97% 97% 98%  Weight:      Height:        Intake/Output Summary (Last 24 hours) at 08/25/2023 1121 Last data filed at 08/25/2023 0800 Gross per 24 hour  Intake 2196.81 ml  Output 2100 ml  Net 96.81 ml   Filed Weights   08/23/23 0134 08/24/23 0500 08/25/23 0420  Weight: 90 kg 91.3 kg 91 kg    Examination:  General exam: Appears in some distress due to coughing  respiratory system: Clear. Respiratory effort normal. Cardiovascular system: Regular  gastrointestinal system: Abdomen is nondistended, soft and nontender. No organomegaly or masses felt. Normal bowel sounds heard.  PEG in place Central nervous system: Awake  Right upper extremity and right lower extremity weaker than left  extremities: No edema. Data Reviewed: I have personally reviewed following labs and imaging studies  CBC: Recent Labs  Lab 08/20/23 0500 08/21/23 0234 08/22/23 0414 08/22/23 1930 08/23/23 0323 08/23/23 1134 08/24/23 0622 08/25/23 0310 08/25/23 0510  WBC 10.1 9.5 8.3  --  12.5*  --  8.8  --  8.6  NEUTROABS 7.9*  --  6.1  --  9.5*  --  6.6  --  7.1  HGB 7.6* 7.2* 7.0*   < > 7.5* 8.9* 8.1* 8.2* 7.3*  HCT 23.8* 23.3* 22.6*   < > 23.7* 28.1* 25.5* 24.0* 22.7*  MCV 97.1 99.1 98.7  --  96.0  --  96.2  --  95.0  PLT 114* 109* 109*  --  122*  --  99*  --  91*   < > = values in this interval not displayed.   Basic Metabolic Panel: Recent Labs  Lab 08/19/23 0347 08/20/23 0500 08/21/23 0234 08/22/23 0414 08/25/23 0310 08/25/23 0510  NA 141 141 138 140 137 135  K 4.1 4.2 4.2 4.4 4.2 4.0  CL 108 109 104 104  --  98  CO2 29 29 26 28   --  29  GLUCOSE 116* 126* 124* 132*  --  138*  BUN 23 30* 31* 34*  --  50*  CREATININE 0.87 1.28* 0.87 1.03  --  1.25*  CALCIUM 8.2* 8.3* 8.2* 8.3*  --  8.2*  MG 2.1  --   --  2.2  --  2.2  PHOS 2.4*  --   --  3.1  --    --    GFR: Estimated Creatinine Clearance: 63 mL/min (A) (by C-G formula based on SCr of 1.25 mg/dL (H)). Liver Function Tests: Recent Labs  Lab 08/19/23 0347  AST 18  ALT 12  ALKPHOS 46  BILITOT 1.5*  PROT 5.4*  ALBUMIN 2.6*   No results for input(s): "LIPASE", "AMYLASE" in the last 168 hours. No results for input(s): "AMMONIA" in the last 168 hours. Coagulation Profile: No results for input(s): "INR", "PROTIME" in the last 168 hours. Cardiac Enzymes: No results for input(s): "CKTOTAL", "CKMB", "CKMBINDEX", "TROPONINI" in the last 168 hours. BNP (last 3 results) No results for input(s): "PROBNP" in the last 8760 hours. HbA1C: No results for input(s): "HGBA1C" in the last 72 hours. CBG: Recent Labs  Lab 08/24/23 1914 08/24/23 2312 08/25/23 0323 08/25/23 0732 08/25/23 1102  GLUCAP 122* 163* 173* 120* 123*   Lipid Profile: No results for input(s): "CHOL", "HDL", "LDLCALC", "TRIG", "CHOLHDL", "LDLDIRECT" in the last 72 hours. Thyroid Function Tests: No results for input(s): "TSH", "T4TOTAL", "FREET4", "T3FREE", "THYROIDAB" in the last 72 hours. Anemia Panel: No results for input(s): "VITAMINB12", "FOLATE", "FERRITIN", "TIBC", "IRON", "RETICCTPCT" in the last 72 hours. Sepsis Labs: Recent Labs  Lab 08/23/23 0323 08/23/23 0438 08/23/23 1134  PROCALCITON 6.17  --   --   LATICACIDVEN  --  1.1 1.1    Recent Results (from the past 240 hours)  Culture, blood (Routine X 2) w Reflex to ID Panel     Status: None   Collection Time: 08/16/23  3:21 PM   Specimen: BLOOD RIGHT HAND  Result Value Ref Range Status   Specimen Description BLOOD RIGHT HAND  Final   Special Requests   Final    BOTTLES DRAWN AEROBIC AND ANAEROBIC Blood Culture adequate volume   Culture   Final    NO GROWTH 5 DAYS Performed at [facility], [address], [place], [place] [address]    Report Status 08/21/2023 FINAL  Final  Respiratory (~20 pathogens) panel by PCR     Status: None    Collection Time: 08/16/23  3:34 PM   Specimen: Nasopharyngeal Swab; Respiratory  Result Value Ref Range Status   Adenovirus NOT DETECTED NOT DETECTED Final   Coronavirus 229E NOT DETECTED NOT DETECTED Final    Comment: (NOTE) The Coronavirus on the Respiratory Panel, DOES NOT test for the novel  Coronavirus (2019 nCoV)    Coronavirus HKU1 NOT DETECTED NOT DETECTED Final   Coronavirus NL63 NOT DETECTED NOT DETECTED Final   Coronavirus OC43 NOT DETECTED NOT DETECTED Final   Metapneumovirus NOT DETECTED NOT DETECTED Final   Rhinovirus / Enterovirus NOT DETECTED NOT DETECTED Final   Influenza A NOT DETECTED NOT DETECTED Final   Influenza B NOT DETECTED NOT DETECTED Final   Parainfluenza Virus 1 NOT DETECTED NOT DETECTED Final   Parainfluenza Virus 2 NOT DETECTED NOT DETECTED Final   Parainfluenza Virus 3 NOT DETECTED NOT DETECTED Final   Parainfluenza Virus 4 NOT DETECTED NOT DETECTED Final   Respiratory Syncytial Virus NOT DETECTED NOT DETECTED Final   Bordetella pertussis NOT DETECTED NOT DETECTED Final   Bordetella Parapertussis NOT DETECTED NOT DETECTED Final   Chlamydophila pneumoniae NOT DETECTED NOT DETECTED Final   Mycoplasma pneumoniae NOT DETECTED NOT DETECTED Final    Comment: Performed at [facility], [address] [place], [place] [address]  Culture, blood (Routine X 2) w Reflex to ID Panel     Status: None   Collection Time: 08/16/23  5:28 PM   Specimen: BLOOD RIGHT HAND  Result Value Ref Range Status   Specimen Description BLOOD RIGHT HAND  Final   Special Requests   Final    BOTTLES DRAWN AEROBIC AND ANAEROBIC Blood Culture results may not be optimal due to  an inadequate volume of blood received in culture bottles   Culture   Final    NO GROWTH 5 DAYS Performed at [facility], [address] [place], [place] [address]    Report Status 08/21/2023 FINAL  Final  Culture, blood (Routine X 2) w Reflex to ID Panel     Status: None (Preliminary result)    Collection Time: 08/23/23 11:34 AM   Specimen: BLOOD RIGHT HAND  Result Value Ref Range Status   Specimen Description BLOOD RIGHT HAND  Final   Special Requests   Final    BOTTLES DRAWN AEROBIC ONLY Blood Culture results may not be optimal due to an inadequate volume of blood received in culture bottles   Culture   Final    NO GROWTH 2 DAYS Performed at [facility], [address] [place], [place] [address]    Report Status PENDING  Incomplete  Culture, blood (Routine X 2) w Reflex to ID Panel     Status: None (Preliminary result)   Collection Time: 08/23/23 11:42 AM   Specimen: BLOOD RIGHT ARM  Result Value Ref Range Status   Specimen Description BLOOD RIGHT ARM  Final   Special Requests   Final    BOTTLES DRAWN AEROBIC AND ANAEROBIC Blood Culture adequate volume   Culture   Final    NO GROWTH 2 DAYS Performed at [facility], [address] [place], [place] [address]    Report Status PENDING  Incomplete         Radiology Studies: VAS [place] UPPER EXTREMITY VENOUS DUPLEX Result Date: 08/23/2023 UPPER VENOUS STUDY  Patient Name:  JASUN OWCZARZAK  Date of Exam:   08/23/2023 Medical Rec #: [id number]   Accession #:    [id number] Date of Birth: July 12, 1956    Patient Gender: M Patient Age:   24 years Exam Location:  [facility] Procedure:      VAS [place] UPPER EXTREMITY VENOUS DUPLEX Referring Phys: Lewie Chamber --------------------------------------------------------------------------------  Indications: Edema Risk Factors: None identified. Comparison Study: 06/16/2023 - Right:                   Findings consistent with acute deep vein thrombosis involving                   the right                   axillary vein. Findings consistent with acute superficial vein                   thrombosis involving                   the right cephalic vein.                    Left:                   Findings consistent with acute superficial vein thrombosis                   involving the                    left cephalic vein. Performing Technologist: Chanda Busing RVT  Examination Guidelines: A complete evaluation includes B-mode imaging, spectral Doppler, color Doppler, and power Doppler as needed of all accessible portions of each vessel. Bilateral testing is considered an integral part of a complete examination. Limited examinations  for reoccurring indications may be performed as noted.  Right Findings: +----------+------------+---------+-----------+----------+-------+ RIGHT     CompressiblePhasicitySpontaneousPropertiesSummary +----------+------------+---------+-----------+----------+-------+ IJV           Full       Yes       Yes                      +----------+------------+---------+-----------+----------+-------+ Subclavian    Full       Yes       Yes                      +----------+------------+---------+-----------+----------+-------+ Axillary      Full       Yes       Yes                      +----------+------------+---------+-----------+----------+-------+ Brachial      Full                                          +----------+------------+---------+-----------+----------+-------+ Radial        Full                                          +----------+------------+---------+-----------+----------+-------+ Ulnar         Full                                          +----------+------------+---------+-----------+----------+-------+ Cephalic      Full                                          +----------+------------+---------+-----------+----------+-------+ Basilic       Full                                          +----------+------------+---------+-----------+----------+-------+  Left Findings: +----------+------------+---------+-----------+----------+-------+ LEFT      CompressiblePhasicitySpontaneousPropertiesSummary +----------+------------+---------+-----------+----------+-------+ Subclavian    Full       Yes        Yes                      +----------+------------+---------+-----------+----------+-------+  Summary:  Right: No evidence of deep vein thrombosis in the upper extremity. No evidence of superficial vein thrombosis in the upper extremity.  Left: No evidence of thrombosis in the subclavian.  *See table(s) above for measurements and observations.  Diagnosing physician: Heath Lark Electronically signed by Heath Lark on 08/23/2023 at 7:54:12 PM.    Final     Scheduled Meds:  sodium chloride   Intravenous Once   sodium chloride   Intravenous Once   amiodarone  200 mg Per Tube Daily   Chlorhexidine Gluconate Cloth  6 each Topical Daily   free water  150 mL Per Tube Q4H   gabapentin  100 mg Per Tube Q8H   insulin aspart  0-15 Units Subcutaneous Q4H   leptospermum manuka honey  1 Application Topical Daily   miconazole   Topical  BID   nutrition supplement (JUVEN)  1 packet Per Tube BID BM   mouth rinse  15 mL Mouth Rinse Q2H   pantoprazole (PROTONIX) IV  40 mg Intravenous Q12H   QUEtiapine  50 mg Per Tube TID   sertraline  100 mg Per Tube Daily   Continuous Infusions:  feeding supplement (OSMOLITE 1.5 CAL) 60 mL/hr at 08/25/23 0800   piperacillin-tazobactam (ZOSYN)  IV 12.5 mL/hr at 08/25/23 0800   promethazine (PHENERGAN) injection (IM or IVPB)     vancomycin 200 mL/hr at 08/25/23 0800     LOS: 11 days    Time spent: 39 min   Alwyn Ren, MD  08/25/2023, 11:21 AM

## 2023-08-25 NOTE — Plan of Care (Signed)
  Problem: Nutrition: Goal: Adequate nutrition will be maintained Outcome: Progressing   Problem: Elimination: Goal: Will not experience complications related to bowel motility Outcome: Progressing   Problem: Safety: Goal: Ability to remain free from injury will improve Outcome: Progressing   

## 2023-08-25 NOTE — Progress Notes (Signed)
   08/25/23 2035  Tracheostomy Shiley XLT Distal 6 mm Cuffed  Placement Date/Time: 08/23/23 1750   Placed By: (c) Other (Comment)  Brand: Shiley XLT Distal  Size (mm): 6 mm  Style: Cuffed  Status Secured with trach ties  Site Assessment Clean;Dry  Ties Assessment Clean, Dry  Cuff Pressure (cm H2O) Red OR 40-60 CmH2O  Tracheostomy Equipment at bedside Yes and checklist posted at head of bed  Adult Ventilator Settings  Vent Type Servo i  Humidity Heated wire  Humidifier Temp Actual 37.2 degC  Vent Mode PSV;CPAP  FiO2 (%) 40 %  Pressure Support 10 cmH20  PEEP 8 cmH20  Adult Ventilator Measurements  Resp Rate Spontaneous 27 br/min  Resp Rate Total 27 br/min  Spont TV 450 mL  Measured Ve 10.9 L  SpO2 97 %  Adult Ventilator Alarms  Alarms On Y  Ve High Alarm 22 L/min  Ve Low Alarm 4 L/min  Resp Rate High Alarm 42 br/min  Resp Rate Low Alarm 10  PEEP Low Alarm 3 cmH2O  Press High Alarm 45 cmH2O  Breath Sounds  Bilateral Breath Sounds Clear;Diminished  Airway Suctioning/Secretions  Suction Type Tracheal  Suction Device  Catheter  Secretion Amount Moderate  Secretion Color Tan;Pink tinged  Secretion Consistency Thick  Suction Tolerance Tolerated fairly well  Suctioning Adverse Effects Other (Comment) (coughing)   Pt weaned 8 hours thus far, attempted to place him back on PRVC and patient began to cough a lot. Wife said this has been an ongoing issue and he coughed a lot last night. I placed him back on PSV/CP 10/8 40% that he has been on all day and he seems to be more comfortable on that. VSS. Good Vt. Patient has audible leak immediately after switching over to PRVS and still has some while on PSV. I think it is cough related. Will check in again after he calsm down. RN aware.

## 2023-08-25 NOTE — Progress Notes (Signed)
NAME:  Joshua Eaton, MRN:  [id number], DOB:  06/04/1956, LOS: 11 ADMISSION DATE:  08/13/2023, CONSULTATION DATE:  12/13 REFERRING MD:  Dr. Eloise Harman, CHIEF COMPLAINT:  GIB   History of Present Illness:  Patient is a 67 yo M w/ pertinent PMH chronic respiratory failure w/ tracheostomy/vent dependent from [facility], Afib on eliquis presents to [facility] ED on 12/13 w/ GIB.  Patient recently admitted w/ respiratory failure and pna in October 2024 requiring tracheostomy. He was later transferred to [facility] on 10/28. Later that day had an accidental trach dislodgement. His wob continued to increase requiring re-admission to [facility] on 10/31 and was intubated and later trached during hospital admission. Patient discharged on 11/12 [facility].   On 12/13 patient admitted to [facility] w/ drop in hgb and concern for GIB. GI consulted for possible endoscopy. TRH to admit and PCCM to consult for vent management.  Pertinent  Medical History   Past Medical History:  Diagnosis Date   Anxiety    Cancer (HCC)    Depression    Hypertension    OCD (obsessive compulsive disorder)    Significant Hospital Events: Including procedures, antibiotic start and stop dates in addition to other pertinent events   12/13 admitted w/ GIB. Pccm to consult for vent management 12/14 1 unit pRBC 12/15 EGD - erosion around PEG site but no active bleeding; cratered ulcer biopsied near peg site.  12/16 plugging with clots- posterior wall bleeding from distal end of trach 12/23 family fired pccm 12/24 pccm asked to re-engage by ENT via RN staff .   Interim History / Subjective:  No issues overnight. Objective   Blood pressure (!) 112/56, pulse 67, temperature (!) 97.5 F (36.4 C), temperature source Axillary, resp. rate 18, height 6' 0.99" (1.854 m), weight 91 kg, SpO2 100%.    Vent Mode: PRVC FiO2 (%):  [40 %-75 %] 40 % Set Rate:  [18 bmp] 18 bmp Vt Set:  [540 mL-640 mL] 540 mL PEEP:  [8 cmH20] 8  cmH20 Plateau Pressure:  [14 cmH20-35 cmH20] 14 cmH20   Intake/Output Summary (Last 24 hours) at 08/25/2023 0953 Last data filed at 08/25/2023 0800 Gross per 24 hour  Intake 2555.59 ml  Output 2100 ml  Net 455.59 ml   Filed Weights   08/23/23 0134 08/24/23 0500 08/25/23 0420  Weight: 90 kg 91.3 kg 91 kg    Examination: General: Chronically ill-appearing trach dependent on mechanical support HEENT: Trach in place, blood-tinged secretions in the tubing. Neuro: Sedate on mechanical support will open eyes randomly CV: Regular rate rhythm S1-S2 PULM: Bilateral mechanically ventilated breaths GI: Soft nontender nondistended Extremities: No edema  Resolved Hospital Problem list   Hypokalemia HTN AKI  Assessment & Plan:    Chronic resp failure Trach/vent dependence Hemoptysis Cough - secondary to irritation of his trach on the posterior tracheal wall - Baseline: Up to 20h TC at [facility], family thinks panic attacks have limited vent weaning. Has been deemed chronic vent dependent. Continue to wean as tolerated.  P Long discussion today with family at bedside about outcome with prolonged tracheostomy and ventilator dependence. His bleeding from the trach has stopped. Would avoid suction trauma. Continue lidocaine nebs to help with cough. Appears comfortable with current medications on board to help with anxiety. Will need to work with family for LTAC placement. We have also approached the discussions regarding palliative care.   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  Mild hyperglycemia Unstagable pressure ulcer on sacrum POA Moderate protein energy malnutrition  Severe deconditioning  Diarrhea -per primary   Best practice:  Diet: TF Pain/Anxiety/Delirium protocol (if indicated): N/A VAP protocol (if indicated): In place DVT prophylaxis: SCD GI prophylaxis: PPI Glucose control: N/A Mobility: turn, progress as tolerated Sacral wound stage 4+, POA Code Status: full  Family Communication: wife updated at bedside  Disposition: Remains in ICU due to vent dependence  Josephine Igo, DO [facility] 08/25/2023 9:53 AM

## 2023-08-26 DIAGNOSIS — K922 Gastrointestinal hemorrhage, unspecified: Secondary | ICD-10-CM | POA: Diagnosis not present

## 2023-08-26 LAB — TYPE AND SCREEN
ABO/RH(D): O POS
Antibody Screen: NEGATIVE
Unit division: 0
Unit division: 0
Unit division: 0

## 2023-08-26 LAB — CBC WITH DIFFERENTIAL/PLATELET
Abs Immature Granulocytes: 0.03 10*3/uL (ref 0.00–0.07)
Basophils Absolute: 0 10*3/uL (ref 0.0–0.1)
Basophils Relative: 0 %
Eosinophils Absolute: 0.5 10*3/uL (ref 0.0–0.5)
Eosinophils Relative: 6 %
HCT: 27.2 % — ABNORMAL LOW (ref 39.0–52.0)
Hemoglobin: 9 g/dL — ABNORMAL LOW (ref 13.0–17.0)
Immature Granulocytes: 0 %
Lymphocytes Relative: 10 %
Lymphs Abs: 0.8 10*3/uL (ref 0.7–4.0)
MCH: 30.4 pg (ref 26.0–34.0)
MCHC: 33.1 g/dL (ref 30.0–36.0)
MCV: 91.9 fL (ref 80.0–100.0)
Monocytes Absolute: 0.6 10*3/uL (ref 0.1–1.0)
Monocytes Relative: 8 %
Neutro Abs: 6.2 10*3/uL (ref 1.7–7.7)
Neutrophils Relative %: 76 %
Platelets: 104 10*3/uL — ABNORMAL LOW (ref 150–400)
RBC: 2.96 MIL/uL — ABNORMAL LOW (ref 4.22–5.81)
RDW: 17.5 % — ABNORMAL HIGH (ref 11.5–15.5)
WBC: 8.2 10*3/uL (ref 4.0–10.5)
nRBC: 0 % (ref 0.0–0.2)

## 2023-08-26 LAB — GLUCOSE, CAPILLARY
Glucose-Capillary: 120 mg/dL — ABNORMAL HIGH (ref 70–99)
Glucose-Capillary: 128 mg/dL — ABNORMAL HIGH (ref 70–99)
Glucose-Capillary: 132 mg/dL — ABNORMAL HIGH (ref 70–99)
Glucose-Capillary: 132 mg/dL — ABNORMAL HIGH (ref 70–99)
Glucose-Capillary: 160 mg/dL — ABNORMAL HIGH (ref 70–99)
Glucose-Capillary: 96 mg/dL (ref 70–99)

## 2023-08-26 LAB — MAGNESIUM: Magnesium: 2.1 mg/dL (ref 1.7–2.4)

## 2023-08-26 LAB — BPAM RBC
Blood Product Expiration Date: 202501192359
Blood Product Expiration Date: 202501162359
Blood Product Expiration Date: 202501122359
ISSUE DATE / TIME: 202412251001
ISSUE DATE / TIME: 202412230556
ISSUE DATE / TIME: 202412221422
Unit Type and Rh: 5100
Unit Type and Rh: 5100
Unit Type and Rh: 5100

## 2023-08-26 LAB — BASIC METABOLIC PANEL
Anion gap: 11 (ref 5–15)
BUN: 47 mg/dL — ABNORMAL HIGH (ref 8–23)
CO2: 30 mmol/L (ref 22–32)
Calcium: 8.1 mg/dL — ABNORMAL LOW (ref 8.9–10.3)
Chloride: 97 mmol/L — ABNORMAL LOW (ref 98–111)
Creatinine, Ser: 1.14 mg/dL (ref 0.61–1.24)
GFR, Estimated: >60 mL/min (ref >60)
Glucose, Bld: 138 mg/dL — ABNORMAL HIGH (ref 70–99)
Potassium: 3.9 mmol/L (ref 3.5–5.1)
Sodium: 138 mmol/L (ref 135–145)

## 2023-08-26 NOTE — Progress Notes (Signed)
Nutrition Follow-up  DOCUMENTATION CODES:   Non-severe (moderate) malnutrition in context of chronic illness  INTERVENTION:  Continue TF via PEG: Osmolite 1.5 at 78ml/hr (1440 ml per day) 60ml Prosource TF20 BID q4h free water flush ( daily) Provides 2320 kcal, 130g protein, and total free water (TF+ FWF)     Add 1 packet Juven BID per tube, each packet provides 95 calories, 2.5 grams of protein (collagen), and 9.8 grams of carbohydrate (3 grams sugar) to support wound healing   NUTRITION DIAGNOSIS:   Moderate Malnutrition related to chronic illness as evidenced by severe muscle depletion, moderate muscle depletion, mild fat depletion.  Ongoing with intervention in place.  GOAL:   Patient will meet greater than or equal to 90% of their needs    MONITOR:   Vent status, TF tolerance, Skin  REASON FOR ASSESSMENT:   Consult Enteral/tube feeding initiation and management  ASSESSMENT:   67 yo male admitted with possible GI Bleed from [facility]; pt with hx of chronic respiratory failure requiring chronic trach/vent. Noted pt with prolonged hospital stay on October for pneumonia, severe MR, pulmonary edema requiring trach/PEG placement. PMH includes HTN, depression/amxiety, OCD Remote follow up;  all information obtained through team and EMR.  Patient continues to tolerate feedings with no concerns noted at this time. Weight trending stable.   12/13 admitted w/ GIB. Pccm to consult for vent management 12/14 1 unit pRBC 12/15 EGD - erosion around PEG site but no active bleeding; cratered ulcer biopsied near peg site.  12/16 bronchoscopy- plugging with clots- posterior wall bleeding from distal end of trach 12/23 family fired pccm 12/24 pccm asked to re-engage by ENT via RN staff   Hospital weight history:  08/26/23 0500 96.7 kg 213.19 lbs  08/25/23 0420 91 kg 200.62 lbs  08/24/23 0500 91.3 kg 201.28 lbs  08/23/23 0134 90 kg 198.41 lbs  08/22/23  1951 90 kg 198.41 lbs  08/22/23 0210 87.9 kg 193.78 lbs  08/21/23 2127 87.9 kg 193.78 lbs  08/14/23 04:53:16 86.6 kg 190.92 lbs   Average Meal Intake: NPO  Nutritionally Relevant Medications: Scheduled Meds:  amiodarone  200 mg Per Tube Daily   free water  150 mL Per Tube Q4H   gabapentin  100 mg Per Tube Q8H   nutrition supplement (JUVEN)  1 packet Per Tube BID BM   QUEtiapine  50 mg Per Tube TID   sertraline  100 mg Per Tube Daily    Continuous Infusions:  feeding supplement (OSMOLITE 1.5 CAL) 60 mL/hr at 08/26/23 1200   linezolid (ZYVOX) IV 300 mL/hr at 08/26/23 1200   piperacillin-tazobactam (ZOSYN)  IV Stopped (08/26/23 0914)   promethazine (PHENERGAN) injection (IM or IVPB)     PRN Meds:.acetaminophen, clonazepam, fentaNYL (SUBLIMAZE) injection, HYDROcodone bit-homatropine, ipratropium-albuterol, lidocaine, lip balm, LORazepam, ondansetron (ZOFRAN) IV, mouth rinse, oxyCODONE, phenol, promethazine (PHENERGAN) injection (IM or IVPB), zolpidem  Labs Reviewed:  CBG ranges from 138-138 mg/dL over the last 24 hours     NUTRITION - FOCUSED PHYSICAL EXAM:  Flowsheet Row Most Recent Value  Orbital Region Mild depletion  Upper Arm Region Mild depletion  Thoracic and Lumbar Region Mild depletion  Buccal Region Mild depletion  Temple Region Severe depletion  Clavicle Bone Region Severe depletion  Clavicle and Acromion Bone Region Moderate depletion  Scapular Bone Region Moderate depletion  Dorsal Hand Unable to assess  Patellar Region Severe depletion  Anterior Thigh Region Severe depletion  Posterior Calf Region Severe depletion  Edema (RD Assessment) Mild  Diet Order:   Diet Order             Diet NPO time specified  Diet effective now                   EDUCATION NEEDS:   Not appropriate for education at this time  Skin:  Skin Integrity Issues:: Unstageable Unstageable: sacrum Other: round rash on L knee; Petechial rash on hands and fee  Last  BM:  12/18  Height:   Ht Readings from Last 1 Encounters:  08/18/23 6' 0.99" (1.854 m)    Weight:   Wt Readings from Last 1 Encounters:  08/26/23 96.7 kg    Ideal Body Weight:     BMI:  Body mass index is 28.13 kg/m.  Estimated Nutritional Needs:   Kcal:  2200-2400 kcals  Protein:  125-150 g  Fluid:  >/= 2L    Jamelle Haring RDN, LDN Clinical Dietitian   If unable to reach, please contact "[facility] Inpatient" secure chat group between 8 am-4 pm daily"

## 2023-08-26 NOTE — Progress Notes (Signed)
SLP Cancellation Note  Patient Details Name: Joshua Eaton MRN: [id number] DOB: 06-Dec-1955   Cancelled treatment:       Reason Eval/Treat Not Completed: Patient not medically ready   Angeliah Wisdom, [name] 08/26/2023, 7:56 AM

## 2023-08-26 NOTE — Progress Notes (Signed)
PROGRESS NOTE    Joshua Eaton  NWG:[id number] DOB: May 17, 1956 DOA: 08/13/2023 PCP: [name], MD  Brief Narrative:   67 yo M w/ pertinent PMH chronic respiratory failure w/ tracheostomy/[facility], Afib on eliquis presented to [facility] ED on 12/13 w/ GIB.   Patient recently admitted w/ respiratory failure and pna in October 2024 requiring tracheostomy initially on 06/18/23. He was later transferred to [facility] on 10/28. Later that day had an accidental trach dislodgement. His wob continued to increase requiring re-admission to [facility] on 10/31 and was intubated and then underwent redo trach on 07/05/23.  He was noted to have some tracheal ring damage and has had intermittent bleeding with clots from suction. Patient discharged on 11/[facility].    On 12/13 patient admitted to [facility] w/ drop in hgb and concern for GIB. GI consulted for possible endoscopy.  Patient underwent EGD on 08/15/2023 showing duodenitis and mucosal changes in the gastric antrum concerning for ulcer as well.   Bronchoscopy performed 08/16/2023 with small clots removed from distal end of trach; trach tip close proximity to membranous posterior wall; Bivona adjustable length trach was ordered.  Assessment & Plan:   Principal Problem:   Acute GI bleeding Active Problems:   Acute respiratory failure with hypoxia (HCC)   Tracheostomy in place [facility])   Acute post-hemorrhagic anemia   PEG (percutaneous endoscopic gastrostomy) status (HCC)   Paroxysmal atrial fibrillation (HCC)   DVT (deep venous thrombosis) (HCC)   AKI (acute kidney injury) (HCC)   Sacral ulcer (HCC)   Severe mitral regurgitation   On mechanically assisted ventilation (HCC)   Tracheostomy dependence (HCC)   Moderate aortic insufficiency   Malnutrition of moderate degree   HTN (hypertension)   Rash   Upper GI bleed   Abnormal finding on GI tract imaging   Cough   Hemoptysis   Fever - patient became  febrile on 12/22 night and WBC uptrended on 12/23 -Fever has now defervesced and WBC normalized on 08/24/2023 -CXR performed on 08/23/2023 showing bibasilar opacities concerning for atelectasis versus infiltrates - Procalcitonin also elevated from prior values - Follow-up blood cultures; NGTD -With rising creatinine 12/25 DC vancomycin and start Zyvox continue Zosyn.  ABLA 2/2 UGIB/ duodenitis - CTA showed active bleeding from gastric antrum/ pylorus with duodenitis -gastric biopsy negative -con't high dose PPI indefinitely per GI, especially since he will eventually need to re-trial DOAC - Home apixaban held for now and will need to monitor Hgb and for evidence of bleeding again once eventually resumed Transfused 1 unit of packed RBC on 08/25/2023 Hb upto 9   Hx RUE DVT 06/16/23 - Eliquis on hold until Hgb stabilizes - repeat RUE duplex performed 12/23: no further DVT noted (prior RUE DVT noted on 10/16 initially) -Left upper extremity duplex from 08/14/2023 showed age-indeterminate SVT involving left cephalic vein, no DVT   Chronic respiratory failure Trach dependency Hemoptysis secondary to irritation of his trach on the posterior tracheal wall s/p bronch 12/16 - Baseline: Up to 20h TC at [facility], family thinks panic attacks have limited vent weaning. Has been deemed chronic vent dependent. Continue to wean as tolerated.  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  Remus Loffler at bedtime   Rash on left  knee-- tinea Infectious disease followed - overall improving  -contact precautions -topical antifungal x 2 weeks total   Petechial rash on hands and feet; blood cultures and serologies negative so far -Appreciate ID's input; workup unrevealing so far. ID team feels clinical appearance most suggestive of drug/abx related rash.  -Blood cultures remain negative -Overall resolving   Thrombocytopenia-improving     Mild hyperglycemia - likely from TF -A1c 4.3% on 07/14/2023 - SSI PRN - goal BG 140-180   Unstagable pressure ulcer on sacrum POA -frequent turns -optimize nutrition, con't TF -wound care following    Moderate protein energy malnutrition  -TF   Severe deconditioning  -PT, OT   Diarrhea -- Discontinue stool softeners/bowel reg   Antimicrobials:zosyn zyvox     DVT prophylaxis:  SCDs Start: 08/13/23   Code Status:   Code Status: Full Code  Pressure Injury 08/14/23 Sacrum Posterior;Medial Unstageable - Full thickness tissue loss in which the base of the injury is covered by slough (yellow, tan, gray, green or brown) and/or eschar (tan, brown or black) in the wound bed. (Active)  08/14/23 0951  Location: Sacrum  Location Orientation: Posterior;Medial  Staging: Unstageable - Full thickness tissue loss in which the base of the injury is covered by slough (yellow, tan, gray, green or brown) and/or eschar (tan, brown or black) in the wound bed.  Wound Description (Comments):   Present on Admission: Yes  Dressing Type Foam - Lift dressing to assess site every shift;Other (Comment) 08/26/23 0800     Nutrition Problem: Moderate Malnutrition Etiology: chronic illness  Signs/Symptoms: severe muscle depletion, moderate muscle depletion, mild fat depletion  Interventions: Tube feeding, Refer to RD note for recommendations  Estimated body mass index is 28.13 kg/m as calculated from the following:   Height as of this encounter: 6' 0.99" (1.854 m).   Weight as of this encounter: 96.7 kg.  DVT prophylaxis: None none due to bleeding and requirement of blood transfusions  code Status: Full code Family Communication: Wife at bedside  disposition Plan:  Status is: Inpatient Remains inpatient appropriate because: Acute illness   Consultants:  PCCM and ENT  Procedures: EGD on 08/15/2023  Bronchoscopy 08/16/2023  Fiberoptic  tracheoscopy 08/23/2023   Antimicrobials:  Anti-infectives (From admission, onward)    Start     Dose/Rate Route Frequency Ordered Stop   08/25/23 2000  linezolid (ZYVOX) IVPB 600 mg        600 mg 300 mL/hr over 60 Minutes Intravenous Every 12 hours 08/25/23 1153     08/23/23 2000  vancomycin (VANCOCIN) IVPB 1000 mg/200 mL premix  Status:  Discontinued        1,000 mg 200 mL/hr over 60 Minutes Intravenous Every 12 hours 08/23/23 0531 08/25/23 1153   08/23/23 0730  vancomycin (VANCOCIN) IVPB 1000 mg/200 mL premix       Placed in "Followed by" Linked Group  1,000 mg 200 mL/hr over 60 Minutes Intravenous  Once 08/23/23 0531 08/23/23 0854   08/23/23 0630  piperacillin-tazobactam (ZOSYN) IVPB 3.375 g        3.375 g 12.5 mL/hr over 240 Minutes Intravenous Every 8 hours 08/23/23 0531     08/23/23 0630  vancomycin (VANCOCIN) IVPB 1000 mg/200 mL premix       Placed in "Followed by" Linked Group   1,000 mg 200 mL/hr over 60 Minutes Intravenous  Once 08/23/23 0531 08/23/23 0720        Subjective: Wife stayed with him overnight she felt his cough was slightly better than the previous night though he is still having coughing spells  Objective: Vitals:    08/26/23 0700 08/26/23 0710 08/26/23 0800 08/26/23 0900  BP: (!) 148/63  (!) 155/63 (!) 141/58  Pulse: 88  95 87  Resp: (!) 30  (!) 36 (!) 23  Temp:  99 F (37.2 C)    TempSrc:  Oral    SpO2: 92%  91% 96%  Weight:      Height:        Intake/Output Summary (Last 24 hours) at 08/26/2023 1036 Last data filed at 08/26/2023 0900 Gross per 24 hour  Intake 2064.8 ml  Output 1550 ml  Net 514.8 ml   Filed Weights   08/24/23 0500 08/25/23 0420 08/26/23 0500  Weight: 91.3 kg 91 kg 96.7 kg    Examination:  General exam: Appears in some distress due to coughing  respiratory system: Few scattered wheezes. Respiratory effort normal. Cardiovascular system: Regular  gastrointestinal system: Abdomen is nondistended, soft and nontender. No organomegaly or masses felt. Normal bowel sounds heard.  PEG in place Central nervous system: Awake left upper and left lower extremity 4 x 5 compared to right 5 x 5. extremities: No edema. Data Reviewed: I have personally reviewed following labs and imaging studies  CBC: Recent Labs  Lab 08/22/23 0414 08/22/23 1930 08/23/23 0323 08/23/23 1134 08/24/23 0622 08/25/23 0310 08/25/23 0510 08/26/23 0454  WBC 8.3  --  12.5*  --  8.8  --  8.6 8.2  NEUTROABS 6.1  --  9.5*  --  6.6  --  7.1 6.2  HGB 7.0*   < > 7.5* 8.9* 8.1* 8.2* 7.3* 9.0*  HCT 22.6*   < > 23.7* 28.1* 25.5* 24.0* 22.7* 27.2*  MCV 98.7  --  96.0  --  96.2  --  95.0 91.9  PLT 109*  --  122*  --  99*  --  91* 104*   < > = values in this interval not displayed.   Basic Metabolic Panel: Recent Labs  Lab 08/20/23 0500 08/21/23 0234 08/22/23 0414 08/25/23 0310 08/25/23 0510 08/26/23 0454  NA 141 138 140 137 135 138  K 4.2 4.2 4.4 4.2 4.0 3.9  CL 109 104 104  --  98 97*  CO2 29 26 28   --  29 30  GLUCOSE 126* 124* 132*  --  138* 138*  BUN 30* 31* 34*  --  50* 47*  CREATININE 1.28* 0.87 1.03  --  1.25* 1.14  CALCIUM 8.3* 8.2* 8.3*  --  8.2* 8.1*  MG  --   --  2.2  --  2.2 2.1  PHOS   --   --  3.1  --   --   --    GFR: Estimated Creatinine Clearance: 74.9 mL/min (by C-G formula based on SCr of 1.14 mg/dL). Liver Function Tests: No results for input(s): "AST", "  ALT", "ALKPHOS", "BILITOT", "PROT", "ALBUMIN" in the last 168 hours.  No results for input(s): "LIPASE", "AMYLASE" in the last 168 hours. No results for input(s): "AMMONIA" in the last 168 hours. Coagulation Profile: No results for input(s): "INR", "PROTIME" in the last 168 hours. Cardiac Enzymes: No results for input(s): "CKTOTAL", "CKMB", "CKMBINDEX", "TROPONINI" in the last 168 hours. BNP (last 3 results) No results for input(s): "PROBNP" in the last 8760 hours. HbA1C: No results for input(s): "HGBA1C" in the last 72 hours. CBG: Recent Labs  Lab 08/25/23 1519 08/25/23 1915 08/25/23 2314 08/26/23 0312 08/26/23 0709  GLUCAP 108* 129* 126* 96 132*   Lipid Profile: No results for input(s): "CHOL", "HDL", "LDLCALC", "TRIG", "CHOLHDL", "LDLDIRECT" in the last 72 hours. Thyroid Function Tests: No results for input(s): "TSH", "T4TOTAL", "FREET4", "T3FREE", "THYROIDAB" in the last 72 hours. Anemia Panel: No results for input(s): "VITAMINB12", "FOLATE", "FERRITIN", "TIBC", "IRON", "RETICCTPCT" in the last 72 hours. Sepsis Labs: Recent Labs  Lab 08/23/23 0323 08/23/23 0438 08/23/23 1134 08/25/23 0510  PROCALCITON 6.17  --   --  11.25  LATICACIDVEN  --  1.1 1.1  --     Recent Results (from the past 240 hours)  Culture, blood (Routine X 2) w Reflex to ID Panel     Status: None   Collection Time: 08/16/23  3:21 PM   Specimen: BLOOD RIGHT HAND  Result Value Ref Range Status   Specimen Description BLOOD RIGHT HAND  Final   Special Requests   Final    BOTTLES DRAWN AEROBIC AND ANAEROBIC Blood Culture adequate volume   Culture   Final    NO GROWTH 5 DAYS Performed at [facility], [address] [place], [place] [address]    Report Status 08/21/2023 FINAL  Final  Respiratory (~20 pathogens)  panel by PCR     Status: None   Collection Time: 08/16/23  3:34 PM   Specimen: Nasopharyngeal Swab; Respiratory  Result Value Ref Range Status   Adenovirus NOT DETECTED NOT DETECTED Final   Coronavirus 229E NOT DETECTED NOT DETECTED Final    Comment: (NOTE) The Coronavirus on the Respiratory Panel, DOES NOT test for the novel  Coronavirus (2019 nCoV)    Coronavirus HKU1 NOT DETECTED NOT DETECTED Final   Coronavirus NL63 NOT DETECTED NOT DETECTED Final   Coronavirus OC43 NOT DETECTED NOT DETECTED Final   Metapneumovirus NOT DETECTED NOT DETECTED Final   Rhinovirus / Enterovirus NOT DETECTED NOT DETECTED Final   Influenza A NOT DETECTED NOT DETECTED Final   Influenza B NOT DETECTED NOT DETECTED Final   Parainfluenza Virus 1 NOT DETECTED NOT DETECTED Final   Parainfluenza Virus 2 NOT DETECTED NOT DETECTED Final   Parainfluenza Virus 3 NOT DETECTED NOT DETECTED Final   Parainfluenza Virus 4 NOT DETECTED NOT DETECTED Final   Respiratory Syncytial Virus NOT DETECTED NOT DETECTED Final   Bordetella pertussis NOT DETECTED NOT DETECTED Final   Bordetella Parapertussis NOT DETECTED NOT DETECTED Final   Chlamydophila pneumoniae NOT DETECTED NOT DETECTED Final   Mycoplasma pneumoniae NOT DETECTED NOT DETECTED Final    Comment: Performed at [facility], [address] [place], [place] [address]  Culture, blood (Routine X 2) w Reflex to ID Panel     Status: None   Collection Time: 08/16/23  5:28 PM   Specimen: BLOOD RIGHT HAND  Result Value Ref Range Status   Specimen Description BLOOD RIGHT HAND  Final   Special Requests   Final    BOTTLES  DRAWN AEROBIC AND ANAEROBIC Blood Culture results may not be optimal due to an inadequate volume of blood received in culture bottles   Culture   Final    NO GROWTH 5 DAYS Performed at [facility], [address], [place], [place] [address]    Report Status 08/21/2023 FINAL  Final  Culture, blood (Routine X 2) w Reflex to ID Panel      Status: None (Preliminary result)   Collection Time: 08/23/23 11:34 AM   Specimen: BLOOD RIGHT HAND  Result Value Ref Range Status   Specimen Description BLOOD RIGHT HAND  Final   Special Requests   Final    BOTTLES DRAWN AEROBIC ONLY Blood Culture results may not be optimal due to an inadequate volume of blood received in culture bottles   Culture   Final    NO GROWTH 3 DAYS Performed at [facility], [address], [place], [place] [address]    Report Status PENDING  Incomplete  Culture, blood (Routine X 2) w Reflex to ID Panel     Status: None (Preliminary result)   Collection Time: 08/23/23 11:42 AM   Specimen: BLOOD RIGHT ARM  Result Value Ref Range Status   Specimen Description BLOOD RIGHT ARM  Final   Special Requests   Final    BOTTLES DRAWN AEROBIC AND ANAEROBIC Blood Culture adequate volume   Culture   Final    NO GROWTH 3 DAYS Performed at [facility], [address] [place], [place] [address]    Report Status PENDING  Incomplete         Radiology Studies: No results found.   Scheduled Meds:  sodium chloride   Intravenous Once   sodium chloride   Intravenous Once   amiodarone  200 mg Per Tube Daily   Chlorhexidine Gluconate Cloth  6 each Topical Daily   free water  150 mL Per Tube Q4H   gabapentin  100 mg Per Tube Q8H   insulin aspart  0-15 Units Subcutaneous Q4H   leptospermum manuka honey  1 Application Topical Daily   miconazole   Topical BID   nutrition supplement (JUVEN)  1 packet Per Tube BID BM   mouth rinse  15 mL Mouth Rinse Q2H   pantoprazole (PROTONIX) IV  40 mg Intravenous Q12H   QUEtiapine  50 mg Per Tube TID   sertraline  100 mg Per Tube Daily   Continuous Infusions:  feeding supplement (OSMOLITE 1.5 CAL) 60 mL/hr at 08/26/23 0900   linezolid (ZYVOX) IV Stopped (08/25/23 2029)   piperacillin-tazobactam (ZOSYN)  IV 12.5 mL/hr at 08/26/23 0900   promethazine (PHENERGAN) injection (IM or IVPB)       LOS: 12 days    Time  spent: 39 min   Alwyn Ren, MD  08/26/2023, 10:36 AM

## 2023-08-27 DIAGNOSIS — D62 Acute posthemorrhagic anemia: Secondary | ICD-10-CM | POA: Diagnosis not present

## 2023-08-27 DIAGNOSIS — Z930 Tracheostomy status: Secondary | ICD-10-CM | POA: Diagnosis not present

## 2023-08-27 DIAGNOSIS — L98429 Non-pressure chronic ulcer of back with unspecified severity: Secondary | ICD-10-CM | POA: Diagnosis not present

## 2023-08-27 DIAGNOSIS — K922 Gastrointestinal hemorrhage, unspecified: Secondary | ICD-10-CM | POA: Diagnosis not present

## 2023-08-27 DIAGNOSIS — R042 Hemoptysis: Secondary | ICD-10-CM | POA: Diagnosis not present

## 2023-08-27 LAB — BASIC METABOLIC PANEL
Anion gap: 7 (ref 5–15)
BUN: 40 mg/dL — ABNORMAL HIGH (ref 8–23)
CO2: 31 mmol/L (ref 22–32)
Calcium: 8 mg/dL — ABNORMAL LOW (ref 8.9–10.3)
Chloride: 99 mmol/L (ref 98–111)
Creatinine, Ser: 1.09 mg/dL (ref 0.61–1.24)
GFR, Estimated: >60 mL/min (ref >60)
Glucose, Bld: 111 mg/dL — ABNORMAL HIGH (ref 70–99)
Potassium: 3.8 mmol/L (ref 3.5–5.1)
Sodium: 137 mmol/L (ref 135–145)

## 2023-08-27 LAB — CBC
HCT: 26.6 % — ABNORMAL LOW (ref 39.0–52.0)
Hemoglobin: 8.4 g/dL — ABNORMAL LOW (ref 13.0–17.0)
MCH: 29.7 pg (ref 26.0–34.0)
MCHC: 31.6 g/dL (ref 30.0–36.0)
MCV: 94 fL (ref 80.0–100.0)
Platelets: 103 10*3/uL — ABNORMAL LOW (ref 150–400)
RBC: 2.83 MIL/uL — ABNORMAL LOW (ref 4.22–5.81)
RDW: 17.1 % — ABNORMAL HIGH (ref 11.5–15.5)
WBC: 7.4 10*3/uL (ref 4.0–10.5)
nRBC: 0.3 % — ABNORMAL HIGH (ref 0.0–0.2)

## 2023-08-27 LAB — GLUCOSE, CAPILLARY
Glucose-Capillary: 106 mg/dL — ABNORMAL HIGH (ref 70–99)
Glucose-Capillary: 121 mg/dL — ABNORMAL HIGH (ref 70–99)
Glucose-Capillary: 137 mg/dL — ABNORMAL HIGH (ref 70–99)
Glucose-Capillary: 111 mg/dL — ABNORMAL HIGH (ref 70–99)
Glucose-Capillary: 128 mg/dL — ABNORMAL HIGH (ref 70–99)

## 2023-08-27 LAB — MAGNESIUM: Magnesium: 2.2 mg/dL (ref 1.7–2.4)

## 2023-08-27 MED ORDER — ZOLPIDEM TARTRATE 5 MG PO TABS
5.0000 mg | ORAL_TABLET | Freq: Every day | ORAL | Status: DC
  Administered 2023-08-27 – 2023-08-30 (×4): 5 mg
  Filled 2023-08-27 (×4): qty 1

## 2023-08-27 NOTE — Progress Notes (Signed)
PROGRESS NOTE    Joshua Eaton  LKG:[id number] DOB: Jan 01, 1956 DOA: 08/13/2023 PCP: [name], MD  Brief Narrative:   67 yo M w/ pertinent PMH chronic respiratory failure w/ tracheostomy/[facility], Afib on eliquis presented to [facility] ED on 12/13 w/ GIB.   Patient recently admitted w/ respiratory failure and pna in October 2024 requiring tracheostomy initially on 06/18/23. He was later transferred to [facility] on 10/28. Later that day had an accidental trach dislodgement. His wob continued to increase requiring re-admission to [facility] on 10/31 and was intubated and then underwent redo trach on 07/05/23.  He was noted to have some tracheal ring damage and has had intermittent bleeding with clots from suction. Patient discharged on 11/[facility].    On 12/13 patient admitted to [facility] w/ drop in hgb and concern for GIB. GI consulted for possible endoscopy.  Patient underwent EGD on 08/15/2023 showing duodenitis and mucosal changes in the gastric antrum concerning for ulcer as well.   Bronchoscopy performed 08/16/2023 with small clots removed from distal end of trach; trach tip close proximity to membranous posterior wall; Bivona adjustable length trach was ordered.  Assessment & Plan:   Principal Problem:   Acute GI bleeding Active Problems:   Acute respiratory failure with hypoxia (HCC)   Tracheostomy in place [facility])   Acute post-hemorrhagic anemia   PEG (percutaneous endoscopic gastrostomy) status (HCC)   Paroxysmal atrial fibrillation (HCC)   DVT (deep venous thrombosis) (HCC)   AKI (acute kidney injury) (HCC)   Sacral ulcer (HCC)   Severe mitral regurgitation   On mechanically assisted ventilation (HCC)   Tracheostomy dependence (HCC)   Moderate aortic insufficiency   Malnutrition of moderate degree   HTN (hypertension)   Rash   Upper GI bleed   Abnormal finding on GI tract imaging   Cough   Hemoptysis   Fever - patient became  febrile on 12/22 night and WBC uptrended on 12/23 -Fever has now defervesced and WBC normalized on 08/24/2023 -CXR performed on 08/23/2023 showing bibasilar opacities concerning for atelectasis versus infiltrates - Procalcitonin also elevated from prior values - Follow-up blood cultures; NGTD -With rising creatinine 12/25 DC vancomycin and start Zyvox continue Zosyn.  ABLA 2/2 UGIB/ duodenitis - CTA showed active bleeding from gastric antrum/ pylorus with duodenitis -gastric biopsy negative -con't high dose PPI indefinitely per GI, especially since he will eventually need to re-trial DOAC - Home apixaban held for now and will need to monitor Hgb and for evidence of bleeding again once eventually resumed Transfused 1 unit of packed RBC on 08/25/2023 Monitor H&H   Hx RUE DVT 06/16/23 - Eliquis on hold until Hgb stabilizes - repeat RUE duplex performed 12/23: no further DVT noted (prior RUE DVT noted on 10/16 initially) -Left upper extremity duplex from 08/14/2023 showed age-indeterminate SVT involving left cephalic vein, no DVT   Chronic respiratory failure Trach dependency Hemoptysis secondary to irritation of his trach on the posterior tracheal wall s/p bronch 12/16 - Baseline: Up to 20h TC at [facility], family thinks panic attacks have limited vent weaning. Has been deemed chronic vent dependent. Continue to wean as tolerated.  - Received TXA this admission, hold further doses - Awaiting Bivona trach; on order  - Trach care per protocol - Gabapentin for cough, tolerating well  - s/p ENT eval for fiberoptic tracheoscopy on 08/23/2023; mild streaks of mucus lining tracheal wall but no bleeding or significant erosions noted -Lidocaine nebs added prn 12/24 he  hasn't received any dose yet   AKI -improving  Vanc stopped 12/25 On zyvox and zosyn   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 - not operative candidate until significant rehabilitation   Paroxysmal Afib -Con't  amio --Hold DOAC as above    Hyperbilirubinemia - no evidence of occlusive gallstones causing ductal dilation  previously has had cholecystectomy -monitor   Anxiety  - ativan as needed; decreased back again to q8h PRN - con't seroquel and sertraline - con't gabapentin for coughing; if too sedating can just use at bedtime.  Remus Loffler at bedtime   Rash on left  knee-- tinea Infectious disease followed - overall improving  -contact precautions -topical antifungal x 2 weeks total   Petechial rash on hands and feet; blood cultures and serologies negative so far -Appreciate ID's input; workup unrevealing so far. ID team feels clinical appearance most suggestive of drug/abx related rash.  -Blood cultures remain negative -Overall resolving   Thrombocytopenia-improving     Mild hyperglycemia - likely from TF -A1c 4.3% on 07/14/2023 - SSI PRN - goal BG 140-180   Unstagable pressure ulcer on sacrum POA -frequent turns -optimize nutrition, con't TF -wound care following    Moderate protein energy malnutrition  -TF   Severe deconditioning  -PT, OT   Diarrhea -- Discontinue stool softeners/bowel reg   Antimicrobials:zosyn zyvox     DVT prophylaxis:  SCDs Start: 08/13/23   Code Status:   Code Status: Full Code  Pressure Injury 08/14/23 Sacrum Posterior;Medial Unstageable - Full thickness tissue loss in which the base of the injury is covered by slough (yellow, tan, gray, green or brown) and/or eschar (tan, brown or black) in the wound bed. (Active)  08/14/23 0951  Location: Sacrum  Location Orientation: Posterior;Medial  Staging: Unstageable - Full thickness tissue loss in which the base of the injury is covered by slough (yellow, tan, gray, green or brown) and/or eschar (tan, brown or black) in the wound bed.  Wound Description (Comments):   Present on Admission: Yes  Dressing Type Gauze (Comment);Foam - Lift dressing to assess site every shift;Normal saline moist  dressing;Other (Comment) 08/27/23 1200    Nutrition Problem: Moderate Malnutrition Etiology: chronic illness  Signs/Symptoms: severe muscle depletion, moderate muscle depletion, mild fat depletion  Interventions: Tube feeding, Refer to RD note for recommendations  Estimated body mass index is 28.22 kg/m as calculated from the following:   Height as of this encounter: 6' 0.99" (1.854 m).   Weight as of this encounter: 97 kg.  DVT prophylaxis: None none due to bleeding and requirement of blood transfusions  code Status: Full code Family Communication: Wife at bedside  disposition Plan:  Status is: Inpatient Remains inpatient appropriate because: Acute illness   Consultants:  PCCM and ENT  Procedures: EGD on 08/15/2023  Bronchoscopy 08/16/2023  Fiberoptic  tracheoscopy 08/23/2023   Antimicrobials:  Anti-infectives (From admission, onward)    Start     Dose/Rate Route Frequency Ordered Stop   08/25/23 2000  linezolid (ZYVOX) IVPB 600 mg        600 mg 300 mL/hr over 60 Minutes Intravenous Every 12 hours 08/25/23 1153     08/23/23 2000  vancomycin (VANCOCIN) IVPB 1000 mg/200 mL premix  Status:  Discontinued        1,000 mg 200 mL/hr over 60 Minutes Intravenous Every 12 hours 08/23/23 0531 08/25/23 1153   08/23/23 0730  vancomycin (VANCOCIN) IVPB 1000 mg/200 mL premix       Placed in "Followed  by" Linked Group   1,000 mg 200 mL/hr over 60 Minutes Intravenous  Once 08/23/23 0531 08/23/23 0854   08/23/23 0630  piperacillin-tazobactam (ZOSYN) IVPB 3.375 g        3.375 g 12.5 mL/hr over 240 Minutes Intravenous Every 8 hours 08/23/23 0531     08/23/23 0630  vancomycin (VANCOCIN) IVPB 1000 mg/200 mL premix       Placed in "Followed by" Linked Group   1,000 mg 200 mL/hr over 60 Minutes Intravenous  Once 08/23/23 0531 08/23/23 0720        Subjective:   Patient is asleep got Ambien and had a restless night with cough Objective: Vitals:   08/27/23 1100 08/27/23 1157 08/27/23  1200 08/27/23 1300  BP: (!) 127/90  111/60 115/62  Pulse: 85  71 71  Resp: (!) 27  16 (!) 22  Temp:  98.3 F (36.8 C)    TempSrc:  Oral    SpO2: 99%  95% 94%  Weight:      Height:        Intake/Output Summary (Last 24 hours) at 08/27/2023 1448 Last data filed at 08/27/2023 1300 Gross per 24 hour  Intake 2120.47 ml  Output 1350 ml  Net 770.47 ml   Filed Weights   08/25/23 0420 08/26/23 0500 08/27/23 0500  Weight: 91 kg 96.7 kg 97 kg    Examination:  General exam: Appears in some distress due to coughing  respiratory system: Few scattered wheezes. Respiratory effort normal. Cardiovascular system: Regular  gastrointestinal system: Abdomen is nondistended, soft and nontender. No organomegaly or masses felt. Normal bowel sounds heard.  PEG in place Central nervous system: Awake left upper and left lower extremity 4 x 5 compared to right 5 x 5. extremities: No edema. Data Reviewed: I have personally reviewed following labs and imaging studies  CBC: Recent Labs  Lab 08/22/23 0414 08/22/23 1930 08/23/23 0323 08/23/23 1134 08/24/23 0622 08/25/23 0310 08/25/23 0510 08/26/23 0454 08/27/23 0327  WBC 8.3  --  12.5*  --  8.8  --  8.6 8.2 7.4  NEUTROABS 6.1  --  9.5*  --  6.6  --  7.1 6.2  --   HGB 7.0*   < > 7.5*   < > 8.1* 8.2* 7.3* 9.0* 8.4*  HCT 22.6*   < > 23.7*   < > 25.5* 24.0* 22.7* 27.2* 26.6*  MCV 98.7  --  96.0  --  96.2  --  95.0 91.9 94.0  PLT 109*  --  122*  --  99*  --  91* 104* 103*   < > = values in this interval not displayed.   Basic Metabolic Panel: Recent Labs  Lab 08/21/23 0234 08/22/23 0414 08/25/23 0310 08/25/23 0510 08/26/23 0454 08/27/23 0327  NA 138 140 137 135 138 137  K 4.2 4.4 4.2 4.0 3.9 3.8  CL 104 104  --  98 97* 99  CO2 26 28  --  29 30 31   GLUCOSE 124* 132*  --  138* 138* 111*  BUN 31* 34*  --  50* 47* 40*  CREATININE 0.87 1.03  --  1.25* 1.14 1.09  CALCIUM 8.2* 8.3*  --  8.2* 8.1* 8.0*  MG  --  2.2  --  2.2 2.1 2.2  PHOS  --   3.1  --   --   --   --    GFR: Estimated Creatinine Clearance: 78.4 mL/min (by C-G formula based on SCr of 1.09 mg/dL). Liver Function  Tests: No results for input(s): "AST", "ALT", "ALKPHOS", "BILITOT", "PROT", "ALBUMIN" in the last 168 hours.  No results for input(s): "LIPASE", "AMYLASE" in the last 168 hours. No results for input(s): "AMMONIA" in the last 168 hours. Coagulation Profile: No results for input(s): "INR", "PROTIME" in the last 168 hours. Cardiac Enzymes: No results for input(s): "CKTOTAL", "CKMB", "CKMBINDEX", "TROPONINI" in the last 168 hours. BNP (last 3 results) No results for input(s): "PROBNP" in the last 8760 hours. HbA1C: No results for input(s): "HGBA1C" in the last 72 hours. CBG: Recent Labs  Lab 08/26/23 1915 08/26/23 2332 08/27/23 0307 08/27/23 0802 08/27/23 1157  GLUCAP 128* 132* 106* 121* 137*   Lipid Profile: No results for input(s): "CHOL", "HDL", "LDLCALC", "TRIG", "CHOLHDL", "LDLDIRECT" in the last 72 hours. Thyroid Function Tests: No results for input(s): "TSH", "T4TOTAL", "FREET4", "T3FREE", "THYROIDAB" in the last 72 hours. Anemia Panel: No results for input(s): "VITAMINB12", "FOLATE", "FERRITIN", "TIBC", "IRON", "RETICCTPCT" in the last 72 hours. Sepsis Labs: Recent Labs  Lab 08/23/23 0323 08/23/23 0438 08/23/23 1134 08/25/23 0510  PROCALCITON 6.17  --   --  11.25  LATICACIDVEN  --  1.1 1.1  --     Recent Results (from the past 240 hours)  Culture, blood (Routine X 2) w Reflex to ID Panel     Status: None (Preliminary result)   Collection Time: 08/23/23 11:34 AM   Specimen: BLOOD RIGHT HAND  Result Value Ref Range Status   Specimen Description BLOOD RIGHT HAND  Final   Special Requests   Final    BOTTLES DRAWN AEROBIC ONLY Blood Culture results may not be optimal due to an inadequate volume of blood received in culture bottles   Culture   Final    NO GROWTH 4 DAYS Performed at [facility], [address]  [place], [place] [address]    Report Status PENDING  Incomplete  Culture, blood (Routine X 2) w Reflex to ID Panel     Status: None (Preliminary result)   Collection Time: 08/23/23 11:42 AM   Specimen: BLOOD RIGHT ARM  Result Value Ref Range Status   Specimen Description BLOOD RIGHT ARM  Final   Special Requests   Final    BOTTLES DRAWN AEROBIC AND ANAEROBIC Blood Culture adequate volume   Culture   Final    NO GROWTH 4 DAYS Performed at [facility], [address] [place], [place] [address]    Report Status PENDING  Incomplete         Radiology Studies: No results found.   Scheduled Meds:  sodium chloride   Intravenous Once   sodium chloride   Intravenous Once   amiodarone  200 mg Per Tube Daily   Chlorhexidine Gluconate Cloth  6 each Topical Daily   free water  150 mL Per Tube Q4H   gabapentin  100 mg Per Tube Q8H   insulin aspart  0-15 Units Subcutaneous Q4H   leptospermum manuka honey  1 Application Topical Daily   miconazole   Topical BID   nutrition supplement (JUVEN)  1 packet Per Tube BID BM   mouth rinse  15 mL Mouth Rinse Q2H   pantoprazole (PROTONIX) IV  40 mg Intravenous Q12H   QUEtiapine  50 mg Per Tube TID   sertraline  100 mg Per Tube Daily   zolpidem  5 mg Per Tube QHS   Continuous Infusions:  feeding supplement (OSMOLITE 1.5 CAL) 60 mL/hr at 08/27/23 1300   linezolid (ZYVOX) IV Stopped (  08/27/23 1115)   piperacillin-tazobactam (ZOSYN)  IV 3.375 g (08/27/23 1326)   promethazine (PHENERGAN) injection (IM or IVPB)       LOS: 13 days    Time spent: 39 min   Alwyn Ren, MD  08/27/2023, 2:48 PM

## 2023-08-27 NOTE — Plan of Care (Signed)
  Problem: Role Relationship: Goal: Method of communication will improve Outcome: Progressing   Problem: Clinical Measurements: Goal: Will remain free from infection Outcome: Progressing   Problem: Nutrition: Goal: Adequate nutrition will be maintained Outcome: Progressing   Problem: Pain Management: Goal: General experience of comfort will improve Outcome: Progressing   Problem: Metabolic: Goal: Ability to maintain appropriate glucose levels will improve Outcome: Progressing   Problem: Respiratory: Goal: Ability to maintain a clear airway and adequate ventilation will improve Outcome: Not Progressing   Problem: Clinical Measurements: Goal: Respiratory complications will improve Outcome: Not Progressing   Problem: Coping: Goal: Level of anxiety will decrease Outcome: Not Progressing

## 2023-08-27 NOTE — Progress Notes (Signed)
Physical Therapy Treatment Patient Details Name: Joshua Eaton MRN: [id number] DOB: 1955/09/13 Today's Date: 08/27/2023   History of Present Illness 67 y.o. male presents to [facility] hospital on 08/13/23 from [facility] with GI bleed. Pt also has red rash on his feet, left knee, and hands (work up underway for cause). LUE DVT from anticubital space to wrist. Some bleeding from trach 12/24, plan for re-size. PMHx: colon CA s/p Rt hemicolectomy, MVR, COPD, PAF, LUE DVT, respiratory failure with trach, severe MR, PEG.    PT Comments  Tolerated treatment well, progressing towards acute rehab goals which were updated today. Able to stand x2 with mod assist +2, EOB with small backwards steps towards bed after rising. Tolerated twice, LEs fatigue quickly but performed for up to 30 seconds each. Reviewed LE exercises. Mod assist for bed mobility, but approaching min assist level. Sat EOB with hands in lap, no Rt lean this date. Family present, supportive, actively participated. Patient will continue to benefit from skilled physical therapy services to further improve independence with functional mobility.     If plan is discharge home, recommend the following: Two people to help with walking and/or transfers;Two people to help with bathing/dressing/bathroom;Assistance with cooking/housework;Assist for transportation   Can travel by private vehicle        Equipment Recommendations  None recommended by PT    Recommendations for Other Services       Precautions / Restrictions Precautions Precautions: Fall Precaution Comments: trach collar, vent,  PEG, sacral wound Restrictions Weight Bearing Restrictions Per Provider Order: No     Mobility  Bed Mobility Overal bed mobility: Needs Assistance Bed Mobility: Rolling, Supine to Sit, Sit to Supine Rolling: Mod assist   Supine to sit: Mod assist Sit to supine: Mod assist   General bed mobility comments: Mod assist, nearing min assist level to rise to  EOB. Heavy LE support to return to supine. VC throughout. Stable in midline today upon rising.    Transfers Overall transfer level: Needs assistance Equipment used: Rolling walker (2 wheels) Transfers: Sit to/from Stand Sit to Stand: +2 physical assistance, Mod assist           General transfer comment: Mod assist +2 for sit to stand from elevated bed surface x 2 today. Increased WOB, unsteady but tolerated ea for about 30 seconds. VC for hand placement and technique. Max assist for partial stand with 1 person assist.    Ambulation/Gait             Pre-gait activities: Took two small steps back towards bed after standing each time, cues for technique, weight shift +2 mod assist for balance.     Stairs             Wheelchair Mobility     Tilt Bed    Modified Rankin (Stroke Patients Only)       Balance Overall balance assessment: Needs assistance Sitting-balance support: Feet supported, No upper extremity supported Sitting balance-Leahy Scale: Fair Sitting balance - Comments: Sat EOB with hands in lap CGA, not leaning today   Standing balance support: Bilateral upper extremity supported Standing balance-Leahy Scale: Poor Standing balance comment: Mod assist to remain standing.                            Cognition Arousal: Alert Behavior During Therapy: WFL for tasks assessed/performed Overall Cognitive Status: Difficult to assess  Exercises General Exercises - Lower Extremity Ankle Circles/Pumps: AROM, Both, 10 reps, Supine Short Arc Quad: Strengthening, Both, 10 reps, Supine    General Comments General comments (skin integrity, edema, etc.): Peep 10, FiO2 40%. VSS throughout      Pertinent Vitals/Pain Pain Assessment Pain Assessment: Faces Faces Pain Scale: Hurts even more Pain Location: buttocks Pain Descriptors / Indicators: Grimacing Pain Intervention(s): Limited  activity within patient's tolerance, Monitored during session, Repositioned    Home Living                          Prior Function            PT Goals (current goals can now be found in the care plan section) Acute Rehab PT Goals Patient Stated Goal: n/a PT Goal Formulation: Patient unable to participate in goal setting Time For Goal Achievement: 08/30/23 Potential to Achieve Goals: Fair Progress towards PT goals: Progressing toward goals    Frequency    Min 1X/week      PT Plan      Co-evaluation              AM-PAC PT "6 Clicks" Mobility   Outcome Measure  Help needed turning from your back to your side while in a flat bed without using bedrails?: A Lot Help needed moving from lying on your back to sitting on the side of a flat bed without using bedrails?: A Lot Help needed moving to and from a bed to a chair (including a wheelchair)?: A Lot Help needed standing up from a chair using your arms (e.g., wheelchair or bedside chair)?: A Lot Help needed to walk in hospital room?: Total Help needed climbing 3-5 steps with a railing? : Total 6 Click Score: 10    End of Session Equipment Utilized During Treatment: Oxygen Activity Tolerance: Patient tolerated treatment well Patient left: in bed;with call bell/phone within reach;with bed alarm set;with family/visitor present Nurse Communication: Mobility status PT Visit Diagnosis: Muscle weakness (generalized) (M62.81);Unsteadiness on feet (R26.81);Difficulty in walking, not elsewhere classified (R26.2);Pain Pain - part of body:  (sacral wound)     Time: 2952-8413 PT Time Calculation (min) (ACUTE ONLY): 36 min  Charges:    $Therapeutic Activity: 23-37 mins PT General Charges $$ ACUTE PT VISIT: 1 Visit                     Kathlyn Sacramento, PT, DPT [facility]  Rehabilitation Services Physical Therapist Office: [phone] Website: [email or web address]    Berton Mount 08/27/2023, 4:44 PM

## 2023-08-27 NOTE — Progress Notes (Incomplete)
Palliative Medicine Progress Note   Patient Name: Joshua Eaton       Date: 08/27/2023 DOB: 1955/11/10  Age: 67 y.o. MRN#: [id number] Attending Physician: Alwyn Ren, MD Primary Care Physician: [name], MD Admit Date: 08/13/2023  Reason for Consultation/Follow-up: {Reason for Consult:23484}  HPI/Patient Profile: HPI/Patient Profile: 67 y.o. male  with past medical history of chronic respiratory failure with tracheostomy and vent dependence who was admitted from [facility] on 08/13/2023 with GI bleeding. EGD 12/15 showed ulcer around PEG site but no active bleeding.  Palliative Medicine was consulted for goals of care and advanced care planning.    Significant Events: 05/20/23 - Initially presented to [facility] with increasing shortness of breath, admitted with ARDS, required intubation on 05/21/23.  06/03/23 - transferred to [facility] due to high vent requirements 06/06/23 - underwent tracheostomy 06/09/23 - TEE showed severe MR; not a surgical candidate per CT surgery 06/16/23 - diagnosed with left upper extremity DVT, started on anticoaglation 06/22/23 - went into new onset a-fib 06/28/23 - transferred to [facility], and later that day had an accidental trach dislodgement 07/01/23 - readmitted to [facility] due to increased work of breathing requiring intubation 07/05/23 - underwent tracheostomy 07/13/23 - discharged back to [facility] 08/13/23 - readmitted to [facility] with decrease in Hgb and concern for GIB  Subjective: Chart reviewed and updates received from RN.   Family Meeting: Per their request, I met with family today in the 25M waiting room. Present are [name], [name] and his significant other, and [name].   We reviewed patient's hospital course and  current clinical condition.   Family expresses much frustration with overall care at [facility] as well as [facility].    Objective:  Physical Exam            LBM: Last BM Date : 08/27/23     Palliative Assessment/Data: ***     Palliative Medicine Assessment & Plan   Assessment: Principal Problem:   Acute GI bleeding Active Problems:   Severe mitral regurgitation   AKI (acute kidney injury) (HCC)   On mechanically assisted ventilation (HCC)   Tracheostomy dependence (HCC)   Moderate aortic insufficiency   Acute respiratory failure with hypoxia (HCC)   Malnutrition of moderate degree   Tracheostomy in place (HCC)   PEG (percutaneous endoscopic gastrostomy) status (  HCC)   Sacral ulcer (HCC)   Paroxysmal atrial fibrillation (HCC)   DVT (deep venous thrombosis) (HCC)   HTN (hypertension)   Rash   Upper GI bleed   Abnormal finding on GI tract imaging   Acute post-hemorrhagic anemia   Cough   Hemoptysis    Recommendations/Plan: Continue full scope care Family requesting transfer to another tertiary medical center; first preference is Nurse, children's  Goals of Care and Additional Recommendations: Limitations on Scope of Treatment: {Recommended Scope and Preferences:21019}  Code Status: Full code   Prognosis:  Unable to determine  Discharge Planning: {Palliative dispostion:23505}  Care plan was discussed with ***  Thank you for allowing the Palliative Medicine Team to assist in the care of this patient.   ***   Merry Proud, NP   Please contact Palliative Medicine Team phone at [phone] for questions and concerns.  For individual providers, please see AMION.

## 2023-08-27 NOTE — Progress Notes (Addendum)
Pharmacy Antibiotic Note  Joshua Eaton is a 67 y.o. male admitted on 08/13/2023 with GI bleed on 12/13 and developed concerns for sepsis.  Pharmacy has been consulted for zosyn dosing. PMH includes chronic respiratory failure w/ tracheostomy/[facility].  -WBC normalized - 7.4, afebrile. No positive cultures since antibiotics were started.  Scr remains ~1.1 (1.09 today) down from 1.25.   Plan: -Continue Zosyn 3.375gm IV every 8 hours -Follow up signs of clinical improvement, LOT, de-escalation of antibiotics   Height: 6' 0.99" (185.4 cm) Weight: 97 kg (213 lb 13.5 oz) IBW/kg (Calculated) : 79.88  Temp (24hrs), Avg:98.4 F (36.9 C), Min:98.2 F (36.8 C), Max:99 F (37.2 C)  Recent Labs  Lab 08/21/23 0234 08/22/23 0414 08/23/23 0323 08/23/23 0438 08/23/23 1134 08/24/23 0622 08/25/23 0510 08/26/23 0454 08/27/23 0327  WBC 9.5 8.3 12.5*  --   --  8.8 8.6 8.2 7.4  CREATININE 0.87 1.03  --   --   --   --  1.25* 1.14 1.09  LATICACIDVEN  --   --   --  1.1 1.1  --   --   --   --     Estimated Creatinine Clearance: 78.4 mL/min (by C-G formula based on SCr of 1.09 mg/dL).    Allergies  Allergen Reactions   Codeine     "sends me into orbit"    Antimicrobials this admission: Zosyn 12/23 >>  Vancomycin 12/23 >> 12/25 12/25 Linezolid >>  Microbiology results: 12/23 Bcx: NGTD x4d  12/16 BCx: NGTD x5d  12/22 Bcx: not yet collected 12/15 Sputum: MRSA  12/14 MRSA PCR: +  Thank you for allowing pharmacy to be a part of this patient's care.  Cedric Fishman, PharmD, BCPS, BCCCP Clinical Pharmacist

## 2023-08-28 DIAGNOSIS — Z931 Gastrostomy status: Secondary | ICD-10-CM | POA: Diagnosis not present

## 2023-08-28 DIAGNOSIS — K922 Gastrointestinal hemorrhage, unspecified: Secondary | ICD-10-CM | POA: Diagnosis not present

## 2023-08-28 DIAGNOSIS — Z930 Tracheostomy status: Secondary | ICD-10-CM | POA: Diagnosis not present

## 2023-08-28 DIAGNOSIS — I340 Nonrheumatic mitral (valve) insufficiency: Secondary | ICD-10-CM | POA: Diagnosis not present

## 2023-08-28 DIAGNOSIS — J9601 Acute respiratory failure with hypoxia: Secondary | ICD-10-CM | POA: Diagnosis not present

## 2023-08-28 LAB — BASIC METABOLIC PANEL
Anion gap: 10 (ref 5–15)
BUN: 36 mg/dL — ABNORMAL HIGH (ref 8–23)
CO2: 30 mmol/L (ref 22–32)
Calcium: 8.2 mg/dL — ABNORMAL LOW (ref 8.9–10.3)
Chloride: 98 mmol/L (ref 98–111)
Creatinine, Ser: 1.08 mg/dL (ref 0.61–1.24)
GFR, Estimated: >60 mL/min (ref >60)
Glucose, Bld: 132 mg/dL — ABNORMAL HIGH (ref 70–99)
Potassium: 3.8 mmol/L (ref 3.5–5.1)
Sodium: 138 mmol/L (ref 135–145)

## 2023-08-28 LAB — CULTURE, BLOOD (ROUTINE X 2)
Culture: NO GROWTH
Culture: NO GROWTH
Special Requests: ADEQUATE

## 2023-08-28 LAB — GLUCOSE, CAPILLARY
Glucose-Capillary: 105 mg/dL — ABNORMAL HIGH (ref 70–99)
Glucose-Capillary: 123 mg/dL — ABNORMAL HIGH (ref 70–99)
Glucose-Capillary: 125 mg/dL — ABNORMAL HIGH (ref 70–99)
Glucose-Capillary: 126 mg/dL — ABNORMAL HIGH (ref 70–99)
Glucose-Capillary: 126 mg/dL — ABNORMAL HIGH (ref 70–99)
Glucose-Capillary: 156 mg/dL — ABNORMAL HIGH (ref 70–99)
Glucose-Capillary: 99 mg/dL (ref 70–99)

## 2023-08-28 LAB — CBC
HCT: 26.9 % — ABNORMAL LOW (ref 39.0–52.0)
Hemoglobin: 8.4 g/dL — ABNORMAL LOW (ref 13.0–17.0)
MCH: 29.3 pg (ref 26.0–34.0)
MCHC: 31.2 g/dL (ref 30.0–36.0)
MCV: 93.7 fL (ref 80.0–100.0)
Platelets: 108 10*3/uL — ABNORMAL LOW (ref 150–400)
RBC: 2.87 MIL/uL — ABNORMAL LOW (ref 4.22–5.81)
RDW: 16.5 % — ABNORMAL HIGH (ref 11.5–15.5)
WBC: 7.5 10*3/uL (ref 4.0–10.5)
nRBC: 0 % (ref 0.0–0.2)

## 2023-08-28 LAB — MAGNESIUM: Magnesium: 2.2 mg/dL (ref 1.7–2.4)

## 2023-08-28 NOTE — Plan of Care (Signed)
  Problem: Activity: Goal: Ability to tolerate increased activity will improve Outcome: Progressing   Problem: Respiratory: Goal: Ability to maintain a clear airway and adequate ventilation will improve Outcome: Progressing   Problem: Role Relationship: Goal: Method of communication will improve Outcome: Progressing   Problem: Education: Goal: Knowledge of General Education information will improve Description: Including pain rating scale, medication(s)/side effects and non-pharmacologic comfort measures Outcome: Progressing   Problem: Clinical Measurements: Goal: Ability to maintain clinical measurements within normal limits will improve Outcome: Progressing Goal: Will remain free from infection Outcome: Progressing Goal: Diagnostic test results will improve Outcome: Progressing Goal: Respiratory complications will improve Outcome: Progressing Goal: Cardiovascular complication will be avoided Outcome: Progressing   Problem: Nutrition: Goal: Adequate nutrition will be maintained Outcome: Progressing   Problem: Coping: Goal: Level of anxiety will decrease Outcome: Progressing   Problem: Elimination: Goal: Will not experience complications related to bowel motility Outcome: Progressing Goal: Will not experience complications related to urinary retention Outcome: Progressing   Problem: Pain Management: Goal: General experience of comfort will improve Outcome: Progressing   Problem: Safety: Goal: Ability to remain free from injury will improve Outcome: Progressing   Problem: Fluid Volume: Goal: Ability to maintain a balanced intake and output will improve Outcome: Progressing

## 2023-08-28 NOTE — Progress Notes (Signed)
PROGRESS NOTE    Joshua Eaton  ZOX:[id number] DOB: 1955-10-19 DOA: 08/13/2023 PCP: [name], MD  Brief Narrative:   67 yo M w/ pertinent PMH chronic respiratory failure w/ trache[facility], Afib on eliquis presented to [facility] ED on 12/13 w/ GIB.   Patient recently admitted w/ respiratory failure and pna in October 2024 requiring tracheostomy initially on 06/18/23. He was later [facility] on [facility]. Later that day had an accidental trach dislodgement. His wob continued to increase requiring re-admission to [facility] on 10/31 and was intubated and then underwent redo trach on 07/05/23.  He was noted to have some tracheal ring damage and has had intermittent bleeding with clots from suction. Patient discharged on [facility].    On 12/13 patient admitted to [facility] w/ drop in hgb and concern for GIB. GI consulted for possible endoscopy.  Patient underwent EGD on 08/15/2023 showing duodenitis and mucosal changes in the gastric antrum concerning for ulcer as well.   Bronchoscopy performed 08/16/2023 with small clots removed from distal end of trach; trach tip close proximity to membranous posterior wall; Bivona adjustable length trach was ordered.  Assessment & Plan:   Principal Problem:   Acute GI bleeding Active Problems:   Acute respiratory failure with hypoxia (HCC)   Tracheostomy in place [facility])   Acute post-hemorrhagic anemia   PEG (percutaneous endoscopic gastrostomy) status (HCC)   Paroxysmal atrial fibrillation (HCC)   DVT (deep venous thrombosis) (HCC)   AKI (acute kidney injury) (HCC)   Sacral ulcer (HCC)   Severe mitral regurgitation   On mechanically assisted ventilation (HCC)   Tracheostomy dependence (HCC)   Moderate aortic insufficiency   Malnutrition of moderate degree   HTN (hypertension)   Rash   Upper GI bleed   Abnormal finding on GI tract imaging   Cough   Hemoptysis   Fever - patient became  febrile on 12/22 night and WBC uptrended on 12/23 -Fever has now defervesced and WBC normalized on 08/24/2023 -CXR performed on 08/23/2023 showing bibasilar opacities concerning for atelectasis versus infiltrates - Procalcitonin also elevated from prior values - Follow-up blood cultures; NGTD -With rising creatinine 12/25 DC vancomycin and start Zyvox continue Zosyn.  ABLA 2/2 UGIB/ duodenitis - CTA showed active bleeding from gastric antrum/ pylorus with duodenitis -gastric biopsy negative -con't high dose PPI indefinitely per GI, especially since he will eventually need to re-trial DOAC - Home apixaban held for now and will need to monitor Hgb and for evidence of bleeding again once eventually resumed Transfused 1 unit of packed RBC on 08/25/2023 Monitor H&H   Hx RUE DVT 06/16/23 - Eliquis on hold until Hgb stabilizes - repeat RUE duplex performed 12/23: no further DVT noted (prior RUE DVT noted on 10/16 initially) -Left upper extremity duplex from 08/14/2023 showed age-indeterminate SVT involving left cephalic vein, no DVT   Chronic respiratory failure Trach dependency Hemoptysis secondary to irritation of his trach on the posterior tracheal wall s/p bronch 12/16 - Baseline: Up to 20h TC at [facility], family thinks panic attacks have limited vent weaning. Has been deemed chronic vent dependent. Continue to wean as tolerated.  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  Remus Loffler at bedtime   Rash on left  knee-- tinea Infectious disease followed - overall improving  -contact precautions -topical antifungal x 2 weeks total   Petechial rash on hands and feet; blood cultures and serologies negative so far -Appreciate ID's input; workup unrevealing so far. ID team feels clinical appearance most suggestive of drug/abx related rash.  -Blood cultures remain negative -Overall resolving   Thrombocytopenia-improving     Mild hyperglycemia - likely from TF -A1c 4.3% on 07/14/2023 - SSI PRN - goal BG 140-180   Unstagable pressure ulcer on sacrum POA -frequent turns -optimize nutrition, con't TF -wound care following    Moderate protein energy malnutrition  -TF   Severe deconditioning  -PT, OT   Diarrhea -- Discontinue stool softeners/bowel reg   Antimicrobials:zosyn zyvox     DVT prophylaxis:  SCDs Start: 08/13/23   Code Status:   Code Status: Full Code  Pressure Injury 08/14/23 Sacrum Posterior;Medial Unstageable - Full thickness tissue loss in which the base of the injury is covered by slough (yellow, tan, gray, green or brown) and/or eschar (tan, brown or black) in the wound bed. (Active)  08/14/23 0951  Location: Sacrum  Location Orientation: Posterior;Medial  Staging: Unstageable - Full thickness tissue loss in which the base of the injury is covered by slough (yellow, tan, gray, green or brown) and/or eschar (tan, brown or black) in the wound bed.  Wound Description (Comments):   Present on Admission: Yes  Dressing Type Foam - Lift dressing to assess site every shift;Moist to dry;Gauze (Comment);Honey 08/27/23 2200    Nutrition  Problem: Moderate Malnutrition Etiology: chronic illness  Signs/Symptoms: severe muscle depletion, moderate muscle depletion, mild fat depletion  Interventions: Tube feeding, Refer to RD note for recommendations  Estimated body mass index is 27.78 kg/m as calculated from the following:   Height as of this encounter: 6' 0.99" (1.854 m).   Weight as of this encounter: 95.5 kg.  DVT prophylaxis: None none due to bleeding and requirement of blood transfusions  code Status: Full code Family Communication: Wife at bedside  disposition Plan:  Status is: Inpatient Remains inpatient appropriate because: Acute illness   Consultants:  PCCM and ENT  Procedures: EGD on 08/15/2023  Bronchoscopy 08/16/2023  Fiberoptic  tracheoscopy 08/23/2023   Antimicrobials:  Anti-infectives (From admission, onward)    Start     Dose/Rate Route Frequency Ordered Stop   08/25/23 2000  linezolid (ZYVOX) IVPB 600 mg        600 mg 300 mL/hr over 60 Minutes Intravenous Every 12 hours 08/25/23 1153     08/23/23 2000  vancomycin (VANCOCIN) IVPB 1000 mg/200 mL premix  Status:  Discontinued        1,000 mg 200 mL/hr over 60 Minutes Intravenous Every 12 hours 08/23/23 0531 08/25/23 1153   08/23/23 0730  vancomycin (VANCOCIN) IVPB 1000 mg/200 mL premix       Placed in "Followed by" Linked Group   1,000 mg 200 mL/hr  over 60 Minutes Intravenous  Once 08/23/23 0531 08/23/23 0854   08/23/23 0630  piperacillin-tazobactam (ZOSYN) IVPB 3.375 g        3.375 g 12.5 mL/hr over 240 Minutes Intravenous Every 8 hours 08/23/23 0531     08/23/23 0630  vancomycin (VANCOCIN) IVPB 1000 mg/200 mL premix       Placed in "Followed by" Linked Group   1,000 mg 200 mL/hr over 60 Minutes Intravenous  Once 08/23/23 0531 08/23/23 0720        Subjective: Wife at bedside reports patient slept better last night coughing was better last night updated her about trying to transfer to [facility] at this time they have no capacity to accept any  new patients  Objective: Vitals:   08/28/23 0731 08/28/23 0800 08/28/23 0811 08/28/23 0900  BP:  126/60  (!) 113/56  Pulse:  75  78  Resp:  20  (!) 24  Temp:   98.8 F (37.1 C)   TempSrc:   Oral   SpO2: 96% 95%  (!) 79%  Weight:      Height:        Intake/Output Summary (Last 24 hours) at 08/28/2023 1104 Last data filed at 08/28/2023 0900 Gross per 24 hour  Intake 1843.31 ml  Output 1975 ml  Net -131.69 ml   Filed Weights   08/26/23 0500 08/27/23 0500 08/28/23 0500  Weight: 96.7 kg 97 kg 95.5 kg    Examination:  General exam: Appears in some distress due to coughing  respiratory system: Few scattered wheezes. Respiratory effort normal. Cardiovascular system: Regular  gastrointestinal system: Abdomen is nondistended, soft and nontender. No organomegaly or masses felt. Normal bowel sounds heard.  PEG in place Central nervous system: Awake left upper and left lower extremity 4 x 5 compared to right 5 x 5. extremities: No edema. Data Reviewed: I have personally reviewed following labs and imaging studies  CBC: Recent Labs  Lab 08/22/23 0414 08/22/23 1930 08/23/23 0323 08/23/23 1134 08/24/23 0622 08/25/23 0310 08/25/23 0510 08/26/23 0454 08/27/23 0327 08/28/23 0345  WBC 8.3  --  12.5*  --  8.8  --  8.6 8.2 7.4 7.5  NEUTROABS 6.1  --  9.5*  --  6.6  --  7.1 6.2  --   --   HGB 7.0*   < > 7.5*   < > 8.1* 8.2* 7.3* 9.0* 8.4* 8.4*  HCT 22.6*   < > 23.7*   < > 25.5* 24.0* 22.7* 27.2* 26.6* 26.9*  MCV 98.7  --  96.0  --  96.2  --  95.0 91.9 94.0 93.7  PLT 109*  --  122*  --  99*  --  91* 104* 103* 108*   < > = values in this interval not displayed.   Basic Metabolic Panel: Recent Labs  Lab 08/22/23 0414 08/25/23 0310 08/25/23 0510 08/26/23 0454 08/27/23 0327 08/28/23 0345  NA 140 137 135 138 137 138  K 4.4 4.2 4.0 3.9 3.8 3.8  CL 104  --  98 97* 99 98  CO2 28  --  29 30 31 30   GLUCOSE 132*  --  138* 138* 111* 132*  BUN 34*  --  50* 47* 40* 36*  CREATININE  1.03  --  1.25* 1.14 1.09 1.08  CALCIUM 8.3*  --  8.2* 8.1* 8.0* 8.2*  MG 2.2  --  2.2 2.1 2.2 2.2  PHOS 3.1  --   --   --   --   --  GFR: Estimated Creatinine Clearance: 73 mL/min (by C-G formula based on SCr of 1.08 mg/dL). Liver Function Tests: No results for input(s): "AST", "ALT", "ALKPHOS", "BILITOT", "PROT", "ALBUMIN" in the last 168 hours.  No results for input(s): "LIPASE", "AMYLASE" in the last 168 hours. No results for input(s): "AMMONIA" in the last 168 hours. Coagulation Profile: No results for input(s): "INR", "PROTIME" in the last 168 hours. Cardiac Enzymes: No results for input(s): "CKTOTAL", "CKMB", "CKMBINDEX", "TROPONINI" in the last 168 hours. BNP (last 3 results) No results for input(s): "PROBNP" in the last 8760 hours. HbA1C: No results for input(s): "HGBA1C" in the last 72 hours. CBG: Recent Labs  Lab 08/27/23 1543 08/27/23 1956 08/28/23 0006 08/28/23 0346 08/28/23 0807  GLUCAP 111* 128* 99 125* 126*   Lipid Profile: No results for input(s): "CHOL", "HDL", "LDLCALC", "TRIG", "CHOLHDL", "LDLDIRECT" in the last 72 hours. Thyroid Function Tests: No results for input(s): "TSH", "T4TOTAL", "FREET4", "T3FREE", "THYROIDAB" in the last 72 hours. Anemia Panel: No results for input(s): "VITAMINB12", "FOLATE", "FERRITIN", "TIBC", "IRON", "RETICCTPCT" in the last 72 hours. Sepsis Labs: Recent Labs  Lab 08/23/23 0323 08/23/23 0438 08/23/23 1134 08/25/23 0510  PROCALCITON 6.17  --   --  11.25  LATICACIDVEN  --  1.1 1.1  --     Recent Results (from the past 240 hours)  Culture, blood (Routine X 2) w Reflex to ID Panel     Status: None   Collection Time: 08/23/23 11:34 AM   Specimen: BLOOD RIGHT HAND  Result Value Ref Range Status   Specimen Description BLOOD RIGHT HAND  Final   Special Requests   Final    BOTTLES DRAWN AEROBIC ONLY Blood Culture results may not be optimal due to an inadequate volume of blood received in culture bottles   Culture    Final    NO GROWTH 5 DAYS Performed at [facility], [address], [place], [place] [address]    Report Status 08/28/2023 FINAL  Final  Culture, blood (Routine X 2) w Reflex to ID Panel     Status: None   Collection Time: 08/23/23 11:42 AM   Specimen: BLOOD RIGHT ARM  Result Value Ref Range Status   Specimen Description BLOOD RIGHT ARM  Final   Special Requests   Final    BOTTLES DRAWN AEROBIC AND ANAEROBIC Blood Culture adequate volume   Culture   Final    NO GROWTH 5 DAYS Performed at [facility], [address] [place], [place] [address]    Report Status 08/28/2023 FINAL  Final         Radiology Studies: No results found.   Scheduled Meds:  sodium chloride   Intravenous Once   sodium chloride   Intravenous Once   amiodarone  200 mg Per Tube Daily   Chlorhexidine Gluconate Cloth  6 each Topical Daily   free water  150 mL Per Tube Q4H   gabapentin  100 mg Per Tube Q8H   insulin aspart  0-15 Units Subcutaneous Q4H   leptospermum manuka honey  1 Application Topical Daily   miconazole   Topical BID   nutrition supplement (JUVEN)  1 packet Per Tube BID BM   mouth rinse  15 mL Mouth Rinse Q2H   pantoprazole (PROTONIX) IV  40 mg Intravenous Q12H   QUEtiapine  50 mg Per Tube TID   sertraline  100 mg Per Tube Daily   zolpidem  5 mg Per Tube QHS   Continuous Infusions:  feeding  supplement (OSMOLITE 1.5 CAL) 60 mL/hr at 08/28/23 0900   linezolid (ZYVOX) IV 600 mg (08/28/23 1047)   piperacillin-tazobactam (ZOSYN)  IV 12.5 mL/hr at 08/28/23 0900   promethazine (PHENERGAN) injection (IM or IVPB)       LOS: 14 days    Time spent: 39 min   Alwyn Ren, MD  08/28/2023, 11:04 AM

## 2023-08-28 NOTE — Progress Notes (Signed)
Patient's wife, [name], informed this RN that she is comfortable proceeding with Bivona trach exchange. She requested that this trach exchange be performed by Anders Simmonds from [facility] or by ENT if Cindee Lame is unavailable. Information will be passed on to day shift team.

## 2023-08-29 ENCOUNTER — Inpatient Hospital Stay (HOSPITAL_COMMUNITY): Payer: Medicare Other

## 2023-08-29 DIAGNOSIS — K922 Gastrointestinal hemorrhage, unspecified: Secondary | ICD-10-CM | POA: Diagnosis not present

## 2023-08-29 LAB — GLUCOSE, CAPILLARY
Glucose-Capillary: 97 mg/dL (ref 70–99)
Glucose-Capillary: 127 mg/dL — ABNORMAL HIGH (ref 70–99)
Glucose-Capillary: 123 mg/dL — ABNORMAL HIGH (ref 70–99)
Glucose-Capillary: 118 mg/dL — ABNORMAL HIGH (ref 70–99)
Glucose-Capillary: 124 mg/dL — ABNORMAL HIGH (ref 70–99)
Glucose-Capillary: 120 mg/dL — ABNORMAL HIGH (ref 70–99)

## 2023-08-29 LAB — CBC
HCT: 27.7 % — ABNORMAL LOW (ref 39.0–52.0)
Hemoglobin: 8.8 g/dL — ABNORMAL LOW (ref 13.0–17.0)
MCH: 29.7 pg (ref 26.0–34.0)
MCHC: 31.8 g/dL (ref 30.0–36.0)
MCV: 93.6 fL (ref 80.0–100.0)
Platelets: 120 10*3/uL — ABNORMAL LOW (ref 150–400)
RBC: 2.96 MIL/uL — ABNORMAL LOW (ref 4.22–5.81)
RDW: 16.6 % — ABNORMAL HIGH (ref 11.5–15.5)
WBC: 8.9 10*3/uL (ref 4.0–10.5)
nRBC: 0 % (ref 0.0–0.2)

## 2023-08-29 LAB — BASIC METABOLIC PANEL
Anion gap: 7 (ref 5–15)
BUN: 34 mg/dL — ABNORMAL HIGH (ref 8–23)
CO2: 31 mmol/L (ref 22–32)
Calcium: 8.5 mg/dL — ABNORMAL LOW (ref 8.9–10.3)
Chloride: 99 mmol/L (ref 98–111)
Creatinine, Ser: 1.01 mg/dL (ref 0.61–1.24)
GFR, Estimated: >60 mL/min (ref >60)
Glucose, Bld: 116 mg/dL — ABNORMAL HIGH (ref 70–99)
Potassium: 4.1 mmol/L (ref 3.5–5.1)
Sodium: 137 mmol/L (ref 135–145)

## 2023-08-29 LAB — MAGNESIUM: Magnesium: 2.1 mg/dL (ref 1.7–2.4)

## 2023-08-29 MED ORDER — FUROSEMIDE 10 MG/ML IJ SOLN
10.0000 mg | Freq: Once | INTRAMUSCULAR | Status: AC
  Administered 2023-08-29: 10 mg via INTRAVENOUS
  Filled 2023-08-29: qty 2

## 2023-08-29 MED ORDER — MIDAZOLAM HCL 2 MG/2ML IJ SOLN
2.0000 mg | Freq: Once | INTRAMUSCULAR | Status: AC
  Administered 2023-08-29: 2 mg via INTRAVENOUS
  Filled 2023-08-29: qty 2

## 2023-08-29 NOTE — Progress Notes (Cosign Needed Addendum)
NAME:  Joshua Eaton, MRN:  [id number], DOB:  [birth year], LOS: 15 ADMISSION DATE:  08/13/2023, CONSULTATION DATE:  12/13 REFERRING MD:  Dr. Eloise Harman, CHIEF COMPLAINT:  GIB   History of Present Illness:  Patient is a 67 yo M w/ pertinent PMH chronic respiratory failure w/ trache[facility], Afib on eliquis presents to [facility] ED on 12/13 w/ GIB.  Patient recently admitted w/ respiratory failure and pna in October 2024 requiring tracheostomy. He was later [facility] on [facility]. Later that day had an accidental trach dislodgement. His wob continued to increase requiring re-admission to [facility] on 10/31 and was intubated and later trached during hospital admission. Patient discharged on 11/[facility].   On 12/13 patient admitted to [facility] w/ drop in hgb and concern for GIB. GI consulted for possible endoscopy. TRH to admit and PCCM to consult for vent management.  Pertinent  Medical History   Past Medical History:  Diagnosis Date   Anxiety    Cancer (HCC)    Depression    Hypertension    OCD (obsessive compulsive disorder)    Significant Hospital Events: Including procedures, antibiotic start and stop dates in addition to other pertinent events   12/13 admitted w/ GIB. Pccm to consult for vent management 12/14 1 unit pRBC 12/15 EGD - erosion around PEG site but no active bleeding; cratered ulcer biopsied near peg site.  12/16 plugging with clots- posterior wall bleeding from distal end of trach 12/23 family fired pccm 12/24 pccm asked to re-engage by ENT via RN staff .  12/29 # 7 adjustable length cuffed biovona placed set at 110 mm wife updated   Interim History / Subjective:  Tolerated trach change well  Objective   Blood pressure (!) 110/50, pulse 72, temperature 98.6 F (37 C), temperature source Oral, resp. rate (!) 21, height 6' 0.99" (1.854 m), weight 95.1 kg, SpO2 94%.    Vent Mode: PSV;CPAP FiO2 (%):  [40 %] 40 % PEEP:   [8 cmH20] 8 cmH20 Pressure Support:  [12 cmH20] 12 cmH20   Intake/Output Summary (Last 24 hours) at 08/29/2023 1357 Last data filed at 08/29/2023 1300 Gross per 24 hour  Intake 2183.63 ml  Output 2125 ml  Net 58.63 ml   Filed Weights   08/27/23 0500 08/28/23 0500 08/29/23 0322  Weight: 97 kg 95.5 kg 95.1 kg    Examination: General chronically ill appearing 67 year old male  HENT now has # 7 cuffed adjustable biovona w/ length set at 110 mm some post change bloody secretions  Pulm scattered rhonchi was comfortable on PSV prior Card rrr Abd soft Ext warm dry  Neuro generalized weakness follows commands tries to interact  Gu cl yellow   Resolved Hospital Problem list   Hypokalemia HTN AKI  Assessment & Plan:    Chronic resp failure Trach/vent dependence Tracheomalacia  Hemoptysis (resolved) secondary to sxn trauma  Cough ABLA 2/2 UGIB/ duodenitis, ongoing slow trend down in Hb Elevated d-dimer despite treatment for DVT with [facility] for the past few months Acute kidney injury 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  Mild hyperglycemia Unstagable pressure ulcer on sacrum POA Moderate protein energy malnutrition  Severe deconditioning  Diarrhea   Pulm prob list  Vent Trach dependence s/p prolonged critical illness complicated further by tracheostomy dislodgement, tracheostomy malpositioning (6 xlt against posterior tracheal wall w/ resultant ulceration and bleeding), tracheomalacia, and severe deconditioning superimposed on underlying severe MV disease (leaflet perf as well as AI).   Discussion  Evaluation of airway demonstrated that there is no longer any ulceration or injury  of the airway but the tip  #6 xlt was indeed angled towards the posterior wall of the trach resulting in increased risk for sxn trauma as well as just potentially injury from trach itself. Also noted that he has fairly sig tracheomalacia. I have now placed adjustable # 7 biovona cuffed w/ length set at . The trach is now midline and about 4 cm above carina. This should help prevent further trauma in future. I am concerned that the tracheomalacia adds yet another challenge to his ability to be liberated from vent   Plan/rec Complete current inhaled lidocaine  Cont routine trach care Will need to keep 2 extra models of this trach at bedside and ensure that if he goes to another facility that they have the ability to order as well.  Will need to be changed q 30d Cleared to resume weaning from my stand-point We will see again in am  Best practice:  Per primary   Ccm time 45 min  Simonne Martinet ACNP-BC [facility] Pulmonary/Critical Care Pager # [phone] OR # [phone] if no answer

## 2023-08-29 NOTE — Progress Notes (Addendum)
PROGRESS NOTE    Joshua Eaton  ZOX:[id number] DOB: June 06, 1956 DOA: 08/13/2023 PCP: [name], MD  Brief Narrative:   67 yo M w/ pertinent PMH chronic respiratory failure w/ tracheostomy/[facility], Afib on eliquis presented to [facility] ED on 12/13 w/ GIB.   Patient recently admitted w/ respiratory failure and pna in October 2024 requiring tracheostomy initially on 06/18/23. He was later transferred to [facility] on 10/[facility]. Later that day had an accidental trach dislodgement. His wob continued to increase requiring re-admission to [facility] on 10/31 and was intubated and then underwent redo trach on 07/05/23.  He was noted to have some tracheal ring damage and has had intermittent bleeding with clots from suction. Patient discharged [facility].    On 12/13 patient admitted to [facility] w/ drop in hgb and concern for GIB. GI consulted for possible endoscopy.  Patient underwent EGD on 08/15/2023 showing duodenitis and mucosal changes in the gastric antrum concerning for ulcer as well.   Bronchoscopy performed 08/16/2023 with small clots removed from distal end of trach; trach tip close proximity to membranous posterior wall; Bivona adjustable length trach was ordered.  Assessment & Plan:   Principal Problem:   Acute GI bleeding Active Problems:   Acute respiratory failure with hypoxia (HCC)   Tracheostomy in place [facility])   Acute post-hemorrhagic anemia   PEG (percutaneous endoscopic gastrostomy) status (HCC)   Paroxysmal atrial fibrillation (HCC)   DVT (deep venous thrombosis) (HCC)   AKI (acute kidney injury) (HCC)   Sacral ulcer (HCC)   Severe mitral regurgitation   On mechanically assisted ventilation (HCC)   Tracheostomy dependence (HCC)   Moderate aortic insufficiency   Malnutrition of moderate degree   HTN (hypertension)   Rash   Upper GI bleed   Abnormal finding on GI tract imaging   Cough   Hemoptysis   Fever-he has not spiked  a temp since 08/22/2023.  He received Zosyn for 6 days and is on Zyvox to finish a 5-day course.  His WBC count has normalized. Will follow-up with chest x-ray at 08/29/2023. He has some rash at the bottom of both feet unclear if this is due to antibiotics  ABLA 2/2 UGIB/ duodenitis-his hemoglobin had remained stable after the last blood transfusion on 08/25/2023.  CT angiogram showed active bleeding from the gastric antrum pylorus with duodenitis.  Gastric biopsy was negative.  Per GI continue high-dose PPI indefinitely since he will need retrial of DOAC.  Apixaban on hold.  Continue to monitor H&H.   Hx RUE DVT 06/16/23-Eliquis on hold. - repeat RUE duplex performed 12/23: no further DVT noted (prior RUE DVT noted on 10/16 initially) -Left upper extremity duplex from 08/14/2023 showed age-indeterminate SVT involving left cephalic vein, no DVT   Chronic respiratory failure Trach dependency Hemoptysis secondary to irritation of his trach on the posterior tracheal wall s/p bronch 12/16.  This has been resolving.  Hemoglobin has been stable and platelets are improving.  Bivona trach ordered and is in-house appreciate Anders Simmonds replacing the Bivona trach.  Continue lidocaine nebs and gabapentin.  He is status post ENT evaluation by Dr. Jenne Pane a fiberoptic tracheoscopy on 08/23/2023 with mild streaks of mucus lining tracheal wall but no bleeding or significant erosions were noted. Lasix 08/29/2023 Lasix 10 mg x 1(soft BP)  AKI -resolved   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 -not operative candidate until for significant rehabilitation.    Paroxysmal Afib -Con't amio --Hold  DOAC as above    Hyperbilirubinemia-follow-up CMP. - no evidence of occlusive gallstones causing ductal dilation  previously has had cholecystectomy   Anxiety  - ativan as needed; decreased back again to q8h PRN - con't seroquel and sertraline - con't gabapentin for coughing; if too sedating can just use at  bedtime.  Remus Loffler at bedtime   Rash on left  knee-- tinea Infectious disease followed - overall improving  -contact precautions -topical antifungal x 2 weeks total   Petechial rash on hands and feet; blood cultures and serologies negative so far -Appreciate ID's input; workup unrevealing so far. ID team feels clinical appearance most suggestive of drug/abx related rash.  -Blood cultures remain negative -Overall resolving   Thrombocytopenia-improving     Mild hyperglycemia - likely from TF -A1c 4.3% on 07/14/2023 - SSI PRN - goal BG 140-180   Unstagable pressure ulcer on sacrum POA -frequent turns -optimize nutrition, con't TF -wound care following    Moderate protein energy malnutrition  -TF   Severe deconditioning  -PT, OT   Diarrhea -- Discontinue stool softeners/bowel reg   Antimicrobials: zyvox     DVT prophylaxis:  SCDs Start: 08/13/23   Code Status:Full Code  Pressure Injury 08/14/23 Sacrum Posterior;Medial Unstageable - Full thickness tissue loss in which the base of the injury is covered by slough (yellow, tan, gray, green or brown) and/or eschar (tan, brown or black) in the wound bed. (Active)  08/14/23 0951  Location: Sacrum  Location Orientation: Posterior;Medial  Staging: Unstageable - Full thickness tissue loss in which the base of the injury is covered by slough (yellow, tan, gray, green or brown) and/or eschar (tan, brown or black) in the wound bed.  Wound Description (Comments):   Present on Admission: Yes  Dressing Type Foam - Lift dressing to assess site every shift;Moist to dry;Honey 08/29/23 0800    Nutrition Problem: Moderate Malnutrition Etiology: chronic illness  Signs/Symptoms: severe muscle depletion, moderate muscle depletion, mild fat depletion  Interventions: Tube feeding, Refer to RD note for recommendations  Estimated body mass index is 27.67 kg/m as calculated from the following:   Height as of this encounter: 6' 0.99"  (1.854 m).   Weight as of this encounter: 95.1 kg.  DVT prophylaxis: None none due to bleeding and requirement of blood transfusions  code Status: Full code Family Communication: Wife at bedside  disposition Plan:  Status is: Inpatient Remains inpatient appropriate because: Acute illness   Consultants:  PCCM and ENT  Procedures: EGD on 08/15/2023  Bronchoscopy 08/16/2023  Fiberoptic  tracheoscopy 08/23/2023   Antimicrobials:  Anti-infectives (From admission, onward)    Start     Dose/Rate Route Frequency Ordered Stop   08/25/23 2000  linezolid (ZYVOX) IVPB 600 mg        600 mg 300 mL/hr over 60 Minutes Intravenous Every 12 hours 08/25/23 1153     08/23/23 2000  vancomycin (VANCOCIN) IVPB 1000 mg/200 mL premix  Status:  Discontinued        1,000 mg 200 mL/hr over 60 Minutes Intravenous Every 12 hours 08/23/23 0531 08/25/23 1153   08/23/23 0730  vancomycin (VANCOCIN) IVPB 1000 mg/200 mL premix       Placed in "Followed by" Linked Group   1,000 mg 200 mL/hr over 60 Minutes Intravenous  Once 08/23/23 0531 08/23/23 0854   08/23/23 0630  piperacillin-tazobactam (ZOSYN) IVPB 3.375 g  Status:  Discontinued        3.375 g 12.5 mL/hr over 240 Minutes Intravenous Every  8 hours 08/23/23 0531 08/29/23 0909   08/23/23 0630  vancomycin (VANCOCIN) IVPB 1000 mg/200 mL premix       Placed in "Followed by" Linked Group   1,000 mg 200 mL/hr over 60 Minutes Intravenous  Once 08/23/23 0531 08/23/23 0720        Subjective:   Informed wife [facility] has no bed to offer 40% FiO2 8 of PEEP Concerned about swelling in the upper and lower extremity Some rash noted in the bottom of both feet  Objective: Vitals:   08/29/23 0800 08/29/23 0807 08/29/23 0845 08/29/23 0900  BP: (!) 131/90   (!) 126/57  Pulse: 84  89 79  Resp: (!) 21  (!) 22 (!) 22  Temp:  98.3 F (36.8 C)    TempSrc:  Axillary    SpO2: 99%  97% 92%  Weight:      Height:        Intake/Output Summary (Last 24 hours) at  08/29/2023 1220 Last data filed at 08/29/2023 0900 Gross per 24 hour  Intake 1703.63 ml  Output 2125 ml  Net -421.37 ml   Filed Weights   08/27/23 0500 08/28/23 0500 08/29/23 0322  Weight: 97 kg 95.5 kg 95.1 kg    Examination:  General exam: Appears in some distress due to coughing  respiratory system: Few scattered wheezes. Respiratory effort normal. Cardiovascular system: Regular  gastrointestinal system: Abdomen is nondistended, soft and nontender. No organomegaly or masses felt. Normal bowel sounds heard.  PEG in place Central nervous system: Awake left upper and left lower extremity 4 x 5 compared to right 5 x 5. extremities: No edema. Data Reviewed: I have personally reviewed following labs and imaging studies  CBC: Recent Labs  Lab 08/23/23 0323 08/23/23 1134 08/24/23 0622 08/25/23 0310 08/25/23 0510 08/26/23 0454 08/27/23 0327 08/28/23 0345 08/29/23 0315  WBC 12.5*  --  8.8  --  8.6 8.2 7.4 7.5 8.9  NEUTROABS 9.5*  --  6.6  --  7.1 6.2  --   --   --   HGB 7.5*   < > 8.1*   < > 7.3* 9.0* 8.4* 8.4* 8.8*  HCT 23.7*   < > 25.5*   < > 22.7* 27.2* 26.6* 26.9* 27.7*  MCV 96.0  --  96.2  --  95.0 91.9 94.0 93.7 93.6  PLT 122*  --  99*  --  91* 104* 103* 108* 120*   < > = values in this interval not displayed.   Basic Metabolic Panel: Recent Labs  Lab 08/25/23 0510 08/26/23 0454 08/27/23 0327 08/28/23 0345 08/29/23 0315  NA 135 138 137 138 137  K 4.0 3.9 3.8 3.8 4.1  CL 98 97* 99 98 99  CO2 29 30 31 30 31   GLUCOSE 138* 138* 111* 132* 116*  BUN 50* 47* 40* 36* 34*  CREATININE 1.25* 1.14 1.09 1.08 1.01  CALCIUM 8.2* 8.1* 8.0* 8.2* 8.5*  MG 2.2 2.1 2.2 2.2 2.1   GFR: Estimated Creatinine Clearance: 78 mL/min (by C-G formula based on SCr of 1.01 mg/dL). Liver Function Tests: No results for input(s): "AST", "ALT", "ALKPHOS", "BILITOT", "PROT", "ALBUMIN" in the last 168 hours.  No results for input(s): "LIPASE", "AMYLASE" in the last 168 hours. No results  for input(s): "AMMONIA" in the last 168 hours. Coagulation Profile: No results for input(s): "INR", "PROTIME" in the last 168 hours. Cardiac Enzymes: No results for input(s): "CKTOTAL", "CKMB", "CKMBINDEX", "TROPONINI" in the last 168 hours. BNP (last 3 results) No results  for input(s): "PROBNP" in the last 8760 hours. HbA1C: No results for input(s): "HGBA1C" in the last 72 hours. CBG: Recent Labs  Lab 08/28/23 2009 08/28/23 2349 08/29/23 0310 08/29/23 0821 08/29/23 1210  GLUCAP 156* 123* 97 127* 123*   Lipid Profile: No results for input(s): "CHOL", "HDL", "LDLCALC", "TRIG", "CHOLHDL", "LDLDIRECT" in the last 72 hours. Thyroid Function Tests: No results for input(s): "TSH", "T4TOTAL", "FREET4", "T3FREE", "THYROIDAB" in the last 72 hours. Anemia Panel: No results for input(s): "VITAMINB12", "FOLATE", "FERRITIN", "TIBC", "IRON", "RETICCTPCT" in the last 72 hours. Sepsis Labs: Recent Labs  Lab 08/23/23 0323 08/23/23 0438 08/23/23 1134 08/25/23 0510  PROCALCITON 6.17  --   --  11.25  LATICACIDVEN  --  1.1 1.1  --     Recent Results (from the past 240 hours)  Culture, blood (Routine X 2) w Reflex to ID Panel     Status: None   Collection Time: 08/23/23 11:34 AM   Specimen: BLOOD RIGHT HAND  Result Value Ref Range Status   Specimen Description BLOOD RIGHT HAND  Final   Special Requests   Final    BOTTLES DRAWN AEROBIC ONLY Blood Culture results may not be optimal due to an inadequate volume of blood received in culture bottles   Culture   Final    NO GROWTH 5 DAYS Performed at [facility], [address], [place], [place] [address]    Report Status 08/28/2023 FINAL  Final  Culture, blood (Routine X 2) w Reflex to ID Panel     Status: None   Collection Time: 08/23/23 11:42 AM   Specimen: BLOOD RIGHT ARM  Result Value Ref Range Status   Specimen Description BLOOD RIGHT ARM  Final   Special Requests   Final    BOTTLES DRAWN AEROBIC AND ANAEROBIC Blood Culture  adequate volume   Culture   Final    NO GROWTH 5 DAYS Performed at [facility], [address], [place], [place] [address]    Report Status 08/28/2023 FINAL  Final         Radiology Studies: No results found.   Scheduled Meds:  sodium chloride   Intravenous Once   sodium chloride   Intravenous Once   amiodarone  200 mg Per Tube Daily   Chlorhexidine Gluconate Cloth  6 each Topical Daily   free water  150 mL Per Tube Q4H   gabapentin  100 mg Per Tube Q8H   insulin aspart  0-15 Units Subcutaneous Q4H   leptospermum manuka honey  1 Application Topical Daily   miconazole   Topical BID   nutrition supplement (JUVEN)  1 packet Per Tube BID BM   mouth rinse  15 mL Mouth Rinse Q2H   pantoprazole (PROTONIX) IV  40 mg Intravenous Q12H   QUEtiapine  50 mg Per Tube TID   sertraline  100 mg Per Tube Daily   zolpidem  5 mg Per Tube QHS   Continuous Infusions:  feeding supplement (OSMOLITE 1.5 CAL) 1,000 mL (08/29/23 0940)   linezolid (ZYVOX) IV 600 mg (08/29/23 1031)   promethazine (PHENERGAN) injection (IM or IVPB)       LOS: 15 days    Time spent: 39 min   Alwyn Ren, MD  08/29/2023, 12:20 PM

## 2023-08-29 NOTE — Plan of Care (Signed)
°  Problem: Activity: Goal: Ability to tolerate increased activity will improve Outcome: Progressing   Problem: Respiratory: Goal: Ability to maintain a clear airway and adequate ventilation will improve Outcome: Progressing   Problem: Role Relationship: Goal: Method of communication will improve Outcome: Progressing   Problem: Education: Goal: Knowledge of General Education information will improve Description: Including pain rating scale, medication(s)/side effects and non-pharmacologic comfort measures Outcome: Progressing   Problem: Clinical Measurements: Goal: Ability to maintain clinical measurements within normal limits will improve Outcome: Progressing Goal: Will remain free from infection Outcome: Progressing Goal: Diagnostic test results will improve Outcome: Progressing Goal: Respiratory complications will improve Outcome: Progressing   Problem: Nutrition: Goal: Adequate nutrition will be maintained Outcome: Progressing   Problem: Coping: Goal: Level of anxiety will decrease Outcome: Progressing   Problem: Elimination: Goal: Will not experience complications related to bowel motility Outcome: Progressing Goal: Will not experience complications related to urinary retention Outcome: Progressing   Problem: Pain Management: Goal: General experience of comfort will improve Outcome: Progressing

## 2023-08-29 NOTE — Procedures (Signed)
Tracheostomy Exchange Procedure Note  Joshua Eaton  [id number]  09-17-1955  Date:08/29/23  Time:2:20 PM   Provider Performing:Pete E Tanja Port   Procedure: Tracheostomy Exchange Through Immature Stoma (63875)  Indication(s) Malpositioned trach   Anesthesia 2 mg versed   Time Out Verified patient identification, verified procedure, special equipment/implants available, medications/allergies/relevant history reviewed, required imaging and test results available.    Sterile Technique Hand hygiene, gloves   Procedure Description Size 6 prox xlt  cuffed existing Shiley removed and size 7 adjustable length biovona placed and secured at 110 cm  cuffed  placed through stoma. Positioning was verified via bedside bronchoscopy and we noted the following findings 1) the new trach is mid line and not angled towards posterior wall 2) he has significant dynamic airway collapse c/w tracheomalacia 3) the previously noted ulceration at posterior wall is no longer present    Complications/Tolerance None; patient tolerated the procedure well..   EBL Minimal

## 2023-08-29 NOTE — Progress Notes (Signed)
CCM MD and NP changed pt's trach from shiley to Bivona without complications. Bronch used to verify placement. Pt tolerated procedure well

## 2023-08-29 NOTE — Progress Notes (Signed)
Palliative Medicine Progress Note   Patient Name: Joshua Eaton       Date: 08/29/2023 DOB: [birth year]  Age: 67 y.o. MRN#: [id number] Attending Physician: Alwyn Ren, MD Primary Care Physician: [name], MD Admit Date: 08/13/2023   HPI/Patient Profile: 67 y.o. male  with past medical history of colon cancer s/p right hemicolectomy 2007, COPD, and MVR.  He was initially hospitalized with 05/20/2023 with ARDS, which resulted in chronic respiratory failure and vent dependence.  His hospital course has been complicated by severe MR, new onset A-fib, trach dislodgment, DVT, and acute blood loss anemia.    He was admitted from [facility] on 08/13/2023 with concern for GI bleeding.  Palliative Medicine was consulted for goals of care.  Significant Events: 05/20/23 - Initially presented to [facility] with increasing shortness of breath, admitted with ARDS, required intubation on 05/21/23.  06/03/23 - transferred to [facility] due to high vent requirements 06/06/23 - underwent tracheostomy 06/09/23 - TEE showed severe MR; not a surgical candidate per CT surgery 06/16/23 - diagnosed with left upper extremity DVT, started on anticoaglation 06/22/23 - went into new onset a-fib 06/28/23 - transferred to [facility], and later that day had an accidental trach dislodgement 07/01/23 - readmitted to [facility] due to increased work of breathing requiring intubation 07/05/23 - underwent tracheostomy 07/13/23 - discharged back to [facility] 08/13/23 - readmitted to [facility] with decrease in Hgb and concern for GIB 08/15/23 - EGD showed ulcer around PEG site but no active bleeding 08/23/23 - tracheoscopy showed no active bleeding and no significant erosion  Subjective: Chart reviewed. Discussed  with Dr. Ashley Royalty and RN regarding updates on transfer request.   Bedside visit. Wife reports patient had a better night last night with less coughing. She is aware that transfer request has been initiated and that currently [facility] does not a bed available.    Objective:  Physical Exam Vitals reviewed.  Constitutional:      General: He is awake. He is not in acute distress.    Appearance: He is ill-appearing.  Pulmonary:     Comments: Trach/vent Neurological:     Comments: Follows commands              Palliative Medicine Assessment & Plan   Assessment: Principal Problem:   Acute GI bleeding Active Problems:  Severe mitral regurgitation   AKI (acute kidney injury) (HCC)   On mechanically assisted ventilation (HCC)   Tracheostomy dependence (HCC)   Moderate aortic insufficiency   Acute respiratory failure with hypoxia (HCC)   Malnutrition of moderate degree   Tracheostomy in place (HCC)   PEG (percutaneous endoscopic gastrostomy) status (HCC)   Sacral ulcer (HCC)   Paroxysmal atrial fibrillation (HCC)   DVT (deep venous thrombosis) (HCC)   HTN (hypertension)   Rash   Upper GI bleed   Abnormal finding on GI tract imaging   Acute post-hemorrhagic anemia   Cough   Hemoptysis    Recommendations/Plan: Continue full scope care Family requesting transfer to another tertiary medical center; first preference is [facility] PMT will continue to follow and support  Code Status: Full code   Prognosis:  Unable to determine   Thank you for allowing the Palliative Medicine Team to assist in the care of this patient.   Time: 25 miutes   Merry Proud, NP   Please contact Palliative Medicine Team phone at [phone] for questions and concerns.  For individual providers, please see AMION.

## 2023-08-30 ENCOUNTER — Inpatient Hospital Stay (HOSPITAL_COMMUNITY): Payer: Medicare Other

## 2023-08-30 DIAGNOSIS — K922 Gastrointestinal hemorrhage, unspecified: Secondary | ICD-10-CM | POA: Diagnosis not present

## 2023-08-30 LAB — COMPREHENSIVE METABOLIC PANEL
ALT: 15 U/L (ref 0–44)
AST: 18 U/L (ref 15–41)
Albumin: 1.7 g/dL — ABNORMAL LOW (ref 3.5–5.0)
Alkaline Phosphatase: 49 U/L (ref 38–126)
Anion gap: 7 (ref 5–15)
BUN: 34 mg/dL — ABNORMAL HIGH (ref 8–23)
CO2: 32 mmol/L (ref 22–32)
Calcium: 8.2 mg/dL — ABNORMAL LOW (ref 8.9–10.3)
Chloride: 99 mmol/L (ref 98–111)
Creatinine, Ser: 0.97 mg/dL (ref 0.61–1.24)
GFR, Estimated: >60 mL/min (ref >60)
Glucose, Bld: 138 mg/dL — ABNORMAL HIGH (ref 70–99)
Potassium: 4 mmol/L (ref 3.5–5.1)
Sodium: 138 mmol/L (ref 135–145)
Total Bilirubin: 0.9 mg/dL (ref <1.2)
Total Protein: 5.1 g/dL — ABNORMAL LOW (ref 6.5–8.1)

## 2023-08-30 LAB — GLUCOSE, CAPILLARY
Glucose-Capillary: 132 mg/dL — ABNORMAL HIGH (ref 70–99)
Glucose-Capillary: 98 mg/dL (ref 70–99)
Glucose-Capillary: 167 mg/dL — ABNORMAL HIGH (ref 70–99)
Glucose-Capillary: 147 mg/dL — ABNORMAL HIGH (ref 70–99)
Glucose-Capillary: 146 mg/dL — ABNORMAL HIGH (ref 70–99)
Glucose-Capillary: 118 mg/dL — ABNORMAL HIGH (ref 70–99)

## 2023-08-30 MED ORDER — ROCURONIUM BROMIDE 10 MG/ML (PF) SYRINGE: 100.0000 mg | PREFILLED_SYRINGE | Freq: Once | INTRAVENOUS | Status: AC

## 2023-08-30 MED ORDER — FUROSEMIDE 10 MG/ML IJ SOLN
20.0000 mg | Freq: Once | INTRAMUSCULAR | Status: AC
  Administered 2023-08-30: 20 mg via INTRAVENOUS
  Filled 2023-08-30: qty 2

## 2023-08-30 MED ORDER — ETOMIDATE 2 MG/ML IV SOLN: 20.0000 mg | Freq: Once | INTRAVENOUS | Status: AC

## 2023-08-30 MED ORDER — BANATROL TF EN LIQD
60.0000 mL | Freq: Two times a day (BID) | ENTERAL | Status: DC
  Administered 2023-08-30 – 2023-09-13 (×27): 60 mL
  Filled 2023-08-30 (×26): qty 60

## 2023-08-30 MED ORDER — CLONAZEPAM 0.5 MG PO TBDP
2.0000 mg | ORAL_TABLET | Freq: Four times a day (QID) | ORAL | Status: DC | PRN
  Administered 2023-08-30 – 2023-08-31 (×2): 2 mg
  Filled 2023-08-30 (×3): qty 4

## 2023-08-30 MED ORDER — LORAZEPAM 2 MG/ML IJ SOLN
1.0000 mg | Freq: Four times a day (QID) | INTRAMUSCULAR | Status: DC | PRN
  Administered 2023-08-30 – 2023-08-31 (×2): 1 mg via INTRAVENOUS
  Filled 2023-08-30 (×2): qty 1

## 2023-08-30 MED ORDER — ETOMIDATE 2 MG/ML IV SOLN
INTRAVENOUS | Status: AC
  Administered 2023-08-30: 20 mg via INTRAVENOUS
  Filled 2023-08-30: qty 10

## 2023-08-30 MED ORDER — ROCURONIUM BROMIDE 10 MG/ML (PF) SYRINGE
PREFILLED_SYRINGE | INTRAVENOUS | Status: AC
  Administered 2023-08-30: 80 mg via INTRAVENOUS
  Filled 2023-08-30: qty 10

## 2023-08-30 NOTE — Progress Notes (Signed)
NAME:  Joshua Eaton, MRN:  [id number], DOB:  06-16-1956, LOS: 16 ADMISSION DATE:  08/13/2023, CONSULTATION DATE:  12/13 REFERRING MD:  Dr. Eloise Harman, CHIEF COMPLAINT:  GIB   History of Present Illness:  Patient is a 67 yo M w/ pertinent PMH chronic respiratory failure w/ tracheostomy/[facility], Afib on eliquis presents to [facility] ED on 12/13 w/ GIB.  Patient recently admitted w/ respiratory failure and pna in October 2024 requiring tracheostomy. [facility]. Later that day had an accidental trach dislodgement. His wob continued to increase requiring re-admission to [facility] on 10/31 and was intubated and later trached during hospital admission. [facility].   On 12/13 patient admitted to [facility] w/ drop in hgb and concern for GIB. GI consulted for possible endoscopy. TRH to admit and PCCM to consult for vent management.  Pertinent  Medical History   Past Medical History:  Diagnosis Date   Anxiety    Cancer (HCC)    Depression    Hypertension    OCD (obsessive compulsive disorder)    Significant Hospital Events: Including procedures, antibiotic start and stop dates in addition to other pertinent events   12/13 admitted w/ GIB. Pccm to consult for vent management 12/14 1 unit pRBC 12/15 EGD - erosion around PEG site but no active bleeding; cratered ulcer biopsied near peg site.  12/16 plugging with clots- posterior wall bleeding from distal end of trach 12/23 family fired pccm 12/24 pccm asked to re-engage by ENT via RN staff .  12/29 # 7 adjustable length cuffed biovona placed set at 110 mm wife updated   Interim History / Subjective:   Had issues with increased secretions, agitation High peak pressures on the ventilator.  Had to be suctioned and mucous plugs removed today.  Objective   Blood pressure (!) 163/83, pulse 91, temperature 97.9 F (36.6 C), temperature source Oral, resp. rate  (!) 23, height 6' 0.99" (1.854 m), weight 97.1 kg, SpO2 98%.    Vent Mode: PSV;CPAP FiO2 (%):  [40 %] 40 % Set Rate:  [16 bmp] 16 bmp Vt Set:  [540 mL] 540 mL PEEP:  [5 cmH20-8 cmH20] 8 cmH20 Pressure Support:  [12 cmH20] 12 cmH20 Plateau Pressure:  [22 cmH20-26 cmH20] 26 cmH20   Intake/Output Summary (Last 24 hours) at 08/30/2023 0857 Last data filed at 08/30/2023 0800 Gross per 24 hour  Intake 2082.05 ml  Output 1400 ml  Net 682.05 ml   Filed Weights   08/28/23 0500 08/29/23 0322 08/30/23 0500  Weight: 95.5 kg 95.1 kg 97.1 kg    Examination: Gen:      No acute distress, chronically ill-appearing HEENT:  EOMI, sclera anicteric Neck:     No masses; no thyromegaly. #& Bivona trach set at 110 mm Lungs:    Scattered rhonchi CV:         Regular rate and rhythm; no murmurs Abd:      + bowel sounds; soft, non-tender; no palpable masses, no distension Ext:    No edema; adequate peripheral perfusion Skin:      Warm and dry; no rash Neuro: Awake, follows commands.   Resolved Hospital Problem list   Hypokalemia HTN AKI  Assessment & Plan:  Chronic resp failure Trach/vent dependence Tracheomalacia  Hemoptysis (resolved) secondary to sxn trauma  Cough ABLA 2/2 UGIB/ duodenitis, ongoing slow trend down in Hb Elevated d-dimer despite treatment for DVT with [facility] for the past  few months Acute kidney injury 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  Mild hyperglycemia Unstagable pressure ulcer on sacrum POA Moderate protein energy malnutrition  Severe deconditioning  Diarrhea   Pulm prob list  Vent Trach dependence s/p prolonged critical illness complicated further by tracheostomy  dislodgement, tracheostomy malpositioning (6 xlt against posterior tracheal wall w/ resultant ulceration and bleeding), tracheomalacia, and severe deconditioning superimposed on underlying severe MV disease (leaflet perf as well as AI).   Discussion  Evaluation of airway demonstrated that there is no longer any ulceration or injury of the airway but the tip  #6 xlt was indeed angled towards the posterior wall of the trach resulting in increased risk for sxn trauma as well as just potentially injury from trach itself. Also noted that he has fairly sig tracheomalacia. I have now placed adjustable # 7 biovona cuffed w/ length set at . The trach is now midline and about 4 cm above carina. This should help prevent further trauma in future. I am concerned that the tracheomalacia adds yet another challenge to his ability to be liberated from vent   Plan/rec Routine trach care, suctioning, chest PT Will need to keep 2 extra models of this trach at bedside and ensure that if he goes to another facility that they have the ability to order as well.  Will need to be changed q 30d Chest x-ray reviewed with increase bilateral opacities.  He is already on linezolid which will finish today Send tracheal aspirate for cultures. Agree with Lasix for diuresis Pressure support weans as tolerated.  Best practice:  Per primary   Chilton Greathouse MD [place] [facility] See Amion for pager  If no response to pager , please call [phone] until 7pm After 7:00 pm call Elink  [phone] 08/30/2023, 9:01 AM

## 2023-08-30 NOTE — Progress Notes (Signed)
RT NOTE: RT called to bedside due to PT desating and not getting volumes on ventilator. RT lavaged and suctioned patient, obtaining moderate amount of bloody secretions and mucous plugs. After suctioning sats immediately improved to 95% and PT getting good volumes on ventilator. PT very tachypneic and labored so RT placed on FS at this time. Vitals are stable. RT will continue to monitor.

## 2023-08-30 NOTE — Progress Notes (Signed)
SLP Cancellation Note  Patient Details Name: Joshua Eaton MRN: [id number] DOB: 1956/02/01   Cancelled treatment:        Continues on vent and RN stated does not feel pt will be tried on trach collar today. He had a "rough night with increased secretions and blood tinged when suctioning." SLP will continue efforts.    Royce Macadamia 08/30/2023, 9:26 AM

## 2023-08-30 NOTE — Progress Notes (Signed)
Pt awake and high peak pressures on the vent on full support settings. Pt switched back to CPAP/PSV 12/8 40%. Pt tolerating well at this time

## 2023-08-30 NOTE — Progress Notes (Signed)
Pt was more agitated and given sedation by RN. Pt's RR low and placed on documented full support vent setting (PRVC from CPAP/PSV). Pt tolerating well at this time. Will evaluate and place pt back to CPAP/PSV once pt is more awake.

## 2023-08-30 NOTE — Progress Notes (Signed)
PROGRESS NOTE    Joshua Eaton  FIE:[id number] DOB: 02/05/1956 DOA: 08/13/2023 PCP: [name], MD  Brief Narrative:   67 yo M w/ pertinent PMH chronic respiratory failure w/ trache[facility], Afib on eliquis presented to [facility] ED on 12/13 w/ GIB.   Patient recently admitted w/ respiratory failure and pna in October 2024 requiring tracheostomy initially on 06/18/23. [facility] was [facility]. Later that day had an accidental trach dislodgement. His wob continued to increase requiring re-admission to [facility] on 10/31 and was intubated and then underwent redo trach on 07/05/23.  He was noted to have some tracheal ring damage and has had intermittent bleeding with clots from suction. [facility].    On 12/13 patient admitted to [facility] w/ drop in hgb and concern for GIB. GI consulted for possible endoscopy.  Patient underwent EGD on 08/15/2023 showing duodenitis and mucosal changes in the gastric antrum concerning for ulcer as well.   Bronchoscopy performed 08/16/2023 with small clots removed from distal end of trach; trach tip close proximity to membranous posterior wall; Bivona adjustable length trach was ordered.  Assessment & Plan:   Principal Problem:   Acute GI bleeding Active Problems:   Acute respiratory failure with hypoxia (HCC)   Tracheostomy in place [facility])   Acute post-hemorrhagic anemia   PEG (percutaneous endoscopic gastrostomy) status (HCC)   Paroxysmal atrial fibrillation (HCC)   DVT (deep venous thrombosis) (HCC)   AKI (acute kidney injury) (HCC)   Sacral ulcer (HCC)   Severe mitral regurgitation   On mechanically assisted ventilation (HCC)   Tracheostomy dependence (HCC)   Moderate aortic insufficiency   Malnutrition of moderate degree   HTN (hypertension)   Rash   Upper GI bleed   Abnormal finding on GI tract imaging   Cough   Hemoptysis   Fever-he has not spiked  a temp since 08/22/2023.  He received Zosyn for 6 days and is on Zyvox to finish a 5-day course.  His WBC count has normalized. Will follow-up with chest x-ray at 08/29/2023. He has some rash at the bottom of both feet unclear if this is due to antibiotics  ABLA 2/2 UGIB/ duodenitis-his hemoglobin had remained stable after the last blood transfusion on 08/25/2023.  CT angiogram showed active bleeding from the gastric antrum pylorus with duodenitis.  Gastric biopsy was negative.  Per GI continue high-dose PPI indefinitely since he will need retrial of DOAC.  Apixaban on hold.  Continue to monitor H&H.   Hx RUE DVT 06/16/23-Eliquis on hold. - repeat RUE duplex performed 12/23: no further DVT noted (prior RUE DVT noted on 10/16 initially) -Left upper extremity duplex from 08/14/2023 showed age-indeterminate SVT involving left cephalic vein, no DVT   Chronic respiratory failure Trach dependency Hemoptysis secondary to irritation of his trach on the posterior tracheal wall s/p bronch 12/16.  This has been resolving.  Hemoglobin has been stable and platelets are improving.  Bivona trach placed 12/29 Continue lidocaine nebs and gabapentin.  He is status post ENT evaluation by Dr. Jenne Pane a fiberoptic tracheoscopy on 08/23/2023 with mild streaks of mucus lining tracheal wall but no bleeding or significant erosions were noted. Lasix 08/29/2023 Lasix 10 mg x 1(soft BP) Lasix 20 mg x 1 on 08/30/2023 Lasix being given as needed He is on gabapentin for cough consider stopping if no improvement  AKI -resolved   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 -not operative  candidate until for significant rehabilitation.    Paroxysmal Afib -Con't amio --Hold DOAC as above    Hyperbilirubinemia-resolved  - no evidence of occlusive gallstones causing ductal dilation  previously has had cholecystectomy   Anxiety Ativan and Klonopin dose increased.  Continue Ambien.  Continue Seroquel and sertraline.    Rash on left  knee-- tinea treated topical antifungal for 2 weeks   Petechial rash on hands and feet; blood cultures and serologies negative so far -Appreciate ID's input; workup unrevealing so far. ID team feels clinical appearance most suggestive of drug/abx related rash.  -Blood cultures remain negative -Zosyn stopped on 08/29/2023 Zyvox last dose 08/30/2023   Thrombocytopenia-improving     Mild hyperglycemia-likely from tube feeds continue SSI last A1c was 4.3.   Unstagable pressure ulcer on sacrum POA-frequent turning optimize nutrition and wound care following   Moderate protein energy malnutrition  -TF   Severe deconditioning  -PT, OT   Diarrhea -- Discontinue stool softeners/bowel reg -Fiber added on 08/30/2023    Antimicrobials: zyvox     DVT prophylaxis:  SCDs Start: 08/13/23   Code Status:Full Code  Pressure Injury 08/14/23 Sacrum Posterior;Medial Unstageable - Full thickness tissue loss in which the base of the injury is covered by slough (yellow, tan, gray, green or brown) and/or eschar (tan, brown or black) in the wound bed. (Active)  08/14/23 0951  Location: Sacrum  Location Orientation: Posterior;Medial  Staging: Unstageable - Full thickness tissue loss in which the base of the injury is covered by slough (yellow, tan, gray, green or brown) and/or eschar (tan, brown or black) in the wound bed.  Wound Description (Comments):   Present on Admission: Yes  Dressing Type Foam - Lift dressing to assess site every shift;Moist to dry;Honey 08/30/23 0800    Nutrition Problem: Moderate Malnutrition Etiology: chronic illness  Signs/Symptoms: severe muscle depletion, moderate muscle depletion, mild fat depletion  Interventions: Tube feeding, Refer to RD note for recommendations  Estimated body mass index is 28.25 kg/m as calculated from the following:   Height as of this encounter: 6' 0.99" (1.854 m).   Weight as of this encounter: 97.1 kg.  DVT  prophylaxis: None none due to bleeding and requirement of blood transfusions  code Status: Full code Family Communication: Wife at bedside  disposition Plan:  Status is: Inpatient Remains inpatient appropriate because: Acute illness   Consultants:  PCCM and ENT  Procedures: EGD on 08/15/2023  Bronchoscopy 08/16/2023  Fiberoptic  tracheoscopy 08/23/2023   Antimicrobials:  Anti-infectives (From admission, onward)    Start     Dose/Rate Route Frequency Ordered Stop   08/25/23 2000  linezolid (ZYVOX) IVPB 600 mg        600 mg 300 mL/hr over 60 Minutes Intravenous Every 12 hours 08/25/23 1153 08/30/23 1121   08/23/23 2000  vancomycin (VANCOCIN) IVPB 1000 mg/200 mL premix  Status:  Discontinued        1,000 mg 200 mL/hr over 60 Minutes Intravenous Every 12 hours 08/23/23 0531 08/25/23 1153   08/23/23 0730  vancomycin (VANCOCIN) IVPB 1000 mg/200 mL premix       Placed in "Followed by" Linked Group   1,000 mg 200 mL/hr over 60 Minutes Intravenous  Once 08/23/23 0531 08/23/23 0854   08/23/23 0630  piperacillin-tazobactam (ZOSYN) IVPB 3.375 g  Status:  Discontinued        3.375 g 12.5 mL/hr over 240 Minutes Intravenous Every 8 hours 08/23/23 0531 08/29/23 0909   08/23/23 0630  vancomycin (VANCOCIN) IVPB 1000  mg/200 mL premix       Placed in "Followed by" Linked Group   1,000 mg 200 mL/hr over 60 Minutes Intravenous  Once 08/23/23 0531 08/23/23 0720        Subjective:    He had Bivona trach placed yesterday had a rough night with spasm and cough  Objective: Vitals:   08/30/23 1106 08/30/23 1107 08/30/23 1118 08/30/23 1208  BP:      Pulse: (!) 103   (!) 112  Resp: (!) 34   (!) 35  Temp:   97.9 F (36.6 C)   TempSrc:   Oral   SpO2: 93% 95%  93%  Weight:      Height:        Intake/Output Summary (Last 24 hours) at 08/30/2023 1235 Last data filed at 08/30/2023 0900 Gross per 24 hour  Intake 1590 ml  Output 1400 ml  Net 190 ml   Filed Weights   08/28/23 0500 08/29/23  0322 08/30/23 0500  Weight: 95.5 kg 95.1 kg 97.1 kg    Examination:  General exam: Appears in some distress due to coughing  respiratory system: Few scattered wheezes. Respiratory effort normal. Cardiovascular system: Regular  gastrointestinal system: Abdomen is nondistended, soft and nontender. No organomegaly or masses felt. Normal bowel sounds heard.  PEG in place Central nervous system: Awake left upper and left lower extremity 4 x 5 compared to right 5 x 5. extremities: No edema. Data Reviewed: I have personally reviewed following labs and imaging studies  CBC: Recent Labs  Lab 08/24/23 0622 08/25/23 0310 08/25/23 0510 08/26/23 0454 08/27/23 0327 08/28/23 0345 08/29/23 0315  WBC 8.8  --  8.6 8.2 7.4 7.5 8.9  NEUTROABS 6.6  --  7.1 6.2  --   --   --   HGB 8.1*   < > 7.3* 9.0* 8.4* 8.4* 8.8*  HCT 25.5*   < > 22.7* 27.2* 26.6* 26.9* 27.7*  MCV 96.2  --  95.0 91.9 94.0 93.7 93.6  PLT 99*  --  91* 104* 103* 108* 120*   < > = values in this interval not displayed.   Basic Metabolic Panel: Recent Labs  Lab 08/25/23 0510 08/26/23 0454 08/27/23 0327 08/28/23 0345 08/29/23 0315 08/30/23 0355  NA 135 138 137 138 137 138  K 4.0 3.9 3.8 3.8 4.1 4.0  CL 98 97* 99 98 99 99  CO2 29 30 31 30 31  32  GLUCOSE 138* 138* 111* 132* 116* 138*  BUN 50* 47* 40* 36* 34* 34*  CREATININE 1.25* 1.14 1.09 1.08 1.01 0.97  CALCIUM 8.2* 8.1* 8.0* 8.2* 8.5* 8.2*  MG 2.2 2.1 2.2 2.2 2.1  --    GFR: Estimated Creatinine Clearance: 88.2 mL/min (by C-G formula based on SCr of 0.97 mg/dL). Liver Function Tests: Recent Labs  Lab 08/30/23 0355  AST 18  ALT 15  ALKPHOS 49  BILITOT 0.9  PROT 5.1*  ALBUMIN 1.7*    No results for input(s): "LIPASE", "AMYLASE" in the last 168 hours. No results for input(s): "AMMONIA" in the last 168 hours. Coagulation Profile: No results for input(s): "INR", "PROTIME" in the last 168 hours. Cardiac Enzymes: No results for input(s): "CKTOTAL", "CKMB",  "CKMBINDEX", "TROPONINI" in the last 168 hours. BNP (last 3 results) No results for input(s): "PROBNP" in the last 8760 hours. HbA1C: No results for input(s): "HGBA1C" in the last 72 hours. CBG: Recent Labs  Lab 08/29/23 2013 08/29/23 2325 08/30/23 0320 08/30/23 0712 08/30/23 1116  GLUCAP 124* 120*  132* 98 167*   Lipid Profile: No results for input(s): "CHOL", "HDL", "LDLCALC", "TRIG", "CHOLHDL", "LDLDIRECT" in the last 72 hours. Thyroid Function Tests: No results for input(s): "TSH", "T4TOTAL", "FREET4", "T3FREE", "THYROIDAB" in the last 72 hours. Anemia Panel: No results for input(s): "VITAMINB12", "FOLATE", "FERRITIN", "TIBC", "IRON", "RETICCTPCT" in the last 72 hours. Sepsis Labs: Recent Labs  Lab 08/25/23 0510  PROCALCITON 11.25    Recent Results (from the past 240 hours)  Culture, blood (Routine X 2) w Reflex to ID Panel     Status: None   Collection Time: 08/23/23 11:34 AM   Specimen: BLOOD RIGHT HAND  Result Value Ref Range Status   Specimen Description BLOOD RIGHT HAND  Final   Special Requests   Final    BOTTLES DRAWN AEROBIC ONLY Blood Culture results may not be optimal due to an inadequate volume of blood received in culture bottles   Culture   Final    NO GROWTH 5 DAYS Performed at [facility], [address] [place], [place] [address]    Report Status 08/28/2023 FINAL  Final  Culture, blood (Routine X 2) w Reflex to ID Panel     Status: None   Collection Time: 08/23/23 11:42 AM   Specimen: BLOOD RIGHT ARM  Result Value Ref Range Status   Specimen Description BLOOD RIGHT ARM  Final   Special Requests   Final    BOTTLES DRAWN AEROBIC AND ANAEROBIC Blood Culture adequate volume   Culture   Final    NO GROWTH 5 DAYS Performed at [facility], [address] [place], [place] [address]    Report Status 08/28/2023 FINAL  Final         Radiology Studies: DG Chest 1 View Result Date: 08/29/2023 CLINICAL DATA:  Cough EXAM: PORTABLE  CHEST 1 VIEW COMPARISON:  08/23/2023 FINDINGS: Cardiac shadow remains enlarged. Tracheostomy tube is again seen in satisfactory position. Right jugular central line is noted. Increase in patchy airspace opacity is noted bilaterally when compare with the prior exam no sizable effusion is seen. No bony abnormality is noted. IMPRESSION: Increase in bilateral airspace opacities bilaterally. Electronically Signed   By: Alcide Clever M.D.   On: 08/29/2023 16:59     Scheduled Meds:  sodium chloride   Intravenous Once   sodium chloride   Intravenous Once   amiodarone  200 mg Per Tube Daily   Chlorhexidine Gluconate Cloth  6 each Topical Daily   fiber supplement (BANATROL TF)  60 mL Per Tube BID   free water  150 mL Per Tube Q4H   gabapentin  100 mg Per Tube Q8H   insulin aspart  0-15 Units Subcutaneous Q4H   leptospermum manuka honey  1 Application Topical Daily   nutrition supplement (JUVEN)  1 packet Per Tube BID BM   mouth rinse  15 mL Mouth Rinse Q2H   pantoprazole (PROTONIX) IV  40 mg Intravenous Q12H   QUEtiapine  50 mg Per Tube TID   sertraline  100 mg Per Tube Daily   zolpidem  5 mg Per Tube QHS   Continuous Infusions:  feeding supplement (OSMOLITE 1.5 CAL) 60 mL/hr at 08/30/23 0900   promethazine (PHENERGAN) injection (IM or IVPB)       LOS: 16 days    Time spent: 39 min   Alwyn Ren, MD  08/30/2023, 12:35 PM

## 2023-08-30 NOTE — Procedures (Signed)
Bronchoscopy Procedure Note  Joshua Eaton  [id number]  1956-01-06  Date:08/30/23  Time:3:20 PM   Provider Performing:Bunyan Brier   Procedure(s):  Flexible bronchoscopy with bronchial alveolar lavage (36644)  Indication(s) Mucus plugs, desats  Consent Risks of the procedure as well as the alternatives and risks of each were explained to the patient and/or caregiver.  Consent for the procedure was obtained and is signed in the bedside chart  Anesthesia Etomidate, Rocuronium   Time Out Verified patient identification, verified procedure, site/side was marked, verified correct patient position, special equipment/implants available, medications/allergies/relevant history reviewed, required imaging and test results available.   Sterile Technique Usual hand hygiene, masks, gowns, and gloves were used   Procedure Description Bronchoscope advanced through endotracheal tube and into airway.  Airways were examined down to subsegmental level with findings noted below.   Following diagnostic evaluation, BAL(s) performed in RML with normal saline and return of mucopurulent fluid  Findings:  Trachea showed erythema and mild edema.  End of the tracheostomy was abutting the posterior wall of the trachea but not completely occluded.  The rest of the airways were clear with no mucous plugging, bleeding or secretions noted.  Complications/Tolerance None; patient tolerated the procedure well. Chest X-ray is not needed post procedure.   EBL Minimal   Specimen(s) BAL  Chilton Greathouse MD [place] [facility] See Amion for pager  If no response to pager , please call [phone] until 7pm After 7:00 pm call Elink  [phone] 08/30/2023, 3:23 PM

## 2023-08-30 NOTE — Progress Notes (Signed)
PCCM note  Patient has multiple episodes of desatting, high peak pressures, low tidal volumes on the ventilator.  RT had to lavage and suction multiple times to clear bloody secretions and mucous plugs.  Will plan on bronchoscopy for airway clearance and BAL  All risks and benefits discussed in detail with wife and she has consented to procedure.  Chilton Greathouse MD [place] [facility] See Amion for pager  If no response to pager , please call [phone] until 7pm After 7:00 pm call Elink  [phone] 08/30/2023, 3:16 PM

## 2023-08-31 ENCOUNTER — Inpatient Hospital Stay (HOSPITAL_COMMUNITY): Payer: Medicare Other

## 2023-08-31 DIAGNOSIS — R9431 Abnormal electrocardiogram [ECG] [EKG]: Secondary | ICD-10-CM

## 2023-08-31 DIAGNOSIS — K922 Gastrointestinal hemorrhage, unspecified: Secondary | ICD-10-CM | POA: Diagnosis not present

## 2023-08-31 LAB — POCT I-STAT 7, (LYTES, BLD GAS, ICA,H+H)
Acid-Base Excess: 8 mmol/L — ABNORMAL HIGH (ref 0.0–2.0)
Bicarbonate: 37.3 mmol/L — ABNORMAL HIGH (ref 20.0–28.0)
Calcium, Ion: 1.3 mmol/L (ref 1.15–1.40)
HCT: 28 % — ABNORMAL LOW (ref 39.0–52.0)
Hemoglobin: 9.5 g/dL — ABNORMAL LOW (ref 13.0–17.0)
O2 Saturation: 80 %
Patient temperature: 99.7
Potassium: 5 mmol/L (ref 3.5–5.1)
Sodium: 138 mmol/L (ref 135–145)
TCO2: 40 mmol/L — ABNORMAL HIGH (ref 22–32)
pCO2 arterial: 85.2 mm[Hg] (ref 32–48)
pH, Arterial: 7.252 — ABNORMAL LOW (ref 7.35–7.45)
pO2, Arterial: 56 mm[Hg] — ABNORMAL LOW (ref 83–108)

## 2023-08-31 LAB — CBC
HCT: 28.8 % — ABNORMAL LOW (ref 39.0–52.0)
Hemoglobin: 8.9 g/dL — ABNORMAL LOW (ref 13.0–17.0)
MCH: 29.7 pg (ref 26.0–34.0)
MCHC: 30.9 g/dL (ref 30.0–36.0)
MCV: 96 fL (ref 80.0–100.0)
Platelets: 159 10*3/uL (ref 150–400)
RBC: 3 MIL/uL — ABNORMAL LOW (ref 4.22–5.81)
RDW: 16.5 % — ABNORMAL HIGH (ref 11.5–15.5)
WBC: 17.3 10*3/uL — ABNORMAL HIGH (ref 4.0–10.5)
nRBC: 0 % (ref 0.0–0.2)

## 2023-08-31 LAB — COMPREHENSIVE METABOLIC PANEL
ALT: 18 U/L (ref 0–44)
AST: 21 U/L (ref 15–41)
Albumin: 2.1 g/dL — ABNORMAL LOW (ref 3.5–5.0)
Alkaline Phosphatase: 56 U/L (ref 38–126)
Anion gap: 6 (ref 5–15)
BUN: 42 mg/dL — ABNORMAL HIGH (ref 8–23)
CO2: 32 mmol/L (ref 22–32)
Calcium: 8.9 mg/dL (ref 8.9–10.3)
Chloride: 100 mmol/L (ref 98–111)
Creatinine, Ser: 1.01 mg/dL (ref 0.61–1.24)
GFR, Estimated: >60 mL/min (ref >60)
Glucose, Bld: 148 mg/dL — ABNORMAL HIGH (ref 70–99)
Potassium: 5 mmol/L (ref 3.5–5.1)
Sodium: 138 mmol/L (ref 135–145)
Total Bilirubin: 1.1 mg/dL (ref 0.0–1.2)
Total Protein: 6.3 g/dL — ABNORMAL LOW (ref 6.5–8.1)

## 2023-08-31 LAB — GLUCOSE, CAPILLARY
Glucose-Capillary: 115 mg/dL — ABNORMAL HIGH (ref 70–99)
Glucose-Capillary: 125 mg/dL — ABNORMAL HIGH (ref 70–99)
Glucose-Capillary: 131 mg/dL — ABNORMAL HIGH (ref 70–99)
Glucose-Capillary: 132 mg/dL — ABNORMAL HIGH (ref 70–99)
Glucose-Capillary: 144 mg/dL — ABNORMAL HIGH (ref 70–99)
Glucose-Capillary: 151 mg/dL — ABNORMAL HIGH (ref 70–99)
Glucose-Capillary: 156 mg/dL — ABNORMAL HIGH (ref 70–99)

## 2023-08-31 LAB — ECHOCARDIOGRAM COMPLETE
AR max vel: 2.94 cm2
AV Area VTI: 3.27 cm2
AV Area mean vel: 2.77 cm2
AV Mean grad: 7 mm[Hg]
AV Peak grad: 14.7 mm[Hg]
Ao pk vel: 1.92 m/s
Area-P 1/2: 2.92 cm2
Calc EF: 62.5 %
Height: 72.992 in
MV M vel: 3.71 m/s
MV Peak grad: 55.1 mm[Hg]
S' Lateral: 3.6 cm
Single Plane A2C EF: 72.7 %
Single Plane A4C EF: 55.8 %
Weight: 3442.7 [oz_av]

## 2023-08-31 LAB — BLOOD GAS, ARTERIAL
Acid-Base Excess: 7.3 mmol/L — ABNORMAL HIGH (ref 0.0–2.0)
Bicarbonate: 38.6 mmol/L — ABNORMAL HIGH (ref 20.0–28.0)
Drawn by: 51185
O2 Saturation: 100 %
Patient temperature: 36.5
pCO2 arterial: 113 mm[Hg] (ref 32–48)
pH, Arterial: 7.14 — CL (ref 7.35–7.45)
pO2, Arterial: 166 mm[Hg] — ABNORMAL HIGH (ref 83–108)

## 2023-08-31 LAB — PROCALCITONIN: Procalcitonin: 2.8 ng/mL

## 2023-08-31 LAB — LACTIC ACID, PLASMA: Lactic Acid, Venous: 1.1 mmol/L (ref 0.5–1.9)

## 2023-08-31 LAB — BRAIN NATRIURETIC PEPTIDE: B Natriuretic Peptide: 664.8 pg/mL — ABNORMAL HIGH (ref 0.0–100.0)

## 2023-08-31 MED ORDER — IOHEXOL 350 MG/ML SOLN
75.0000 mL | Freq: Once | INTRAVENOUS | Status: AC | PRN
  Administered 2023-08-31: 75 mL via INTRAVENOUS

## 2023-08-31 MED ORDER — SODIUM CHLORIDE 0.9 % IV SOLN
1.0000 g | Freq: Three times a day (TID) | INTRAVENOUS | Status: DC
  Administered 2023-08-31 – 2023-09-02 (×6): 1 g via INTRAVENOUS
  Filled 2023-08-31 (×6): qty 20

## 2023-08-31 MED ORDER — ARTIFICIAL TEARS OPHTHALMIC OINT
1.0000 | TOPICAL_OINTMENT | Freq: Three times a day (TID) | OPHTHALMIC | Status: DC
  Administered 2023-08-31 – 2023-09-05 (×14): 1 via OPHTHALMIC
  Filled 2023-08-31 (×2): qty 3.5

## 2023-08-31 MED ORDER — FENTANYL CITRATE PF 50 MCG/ML IJ SOSY: 25.0000 ug | PREFILLED_SYRINGE | Freq: Once | INTRAMUSCULAR | Status: DC

## 2023-08-31 MED ORDER — CISATRACURIUM BOLUS VIA INFUSION
0.1000 mg/kg | Freq: Once | INTRAVENOUS | Status: AC
Start: 2023-08-31 — End: 2023-08-31
  Administered 2023-08-31: 9.8 mg via INTRAVENOUS
  Filled 2023-08-31: qty 10

## 2023-08-31 MED ORDER — POLYETHYLENE GLYCOL 3350 17 G PO PACK
17.0000 g | PACK | Freq: Every day | ORAL | Status: DC
  Administered 2023-08-31 – 2023-09-01 (×2): 17 g
  Filled 2023-08-31 (×2): qty 1

## 2023-08-31 MED ORDER — FENTANYL BOLUS VIA INFUSION
25.0000 ug | INTRAVENOUS | Status: DC | PRN
Start: 2023-08-31 — End: 2023-08-31

## 2023-08-31 MED ORDER — ENOXAPARIN SODIUM 40 MG/0.4ML IJ SOSY
40.0000 mg | PREFILLED_SYRINGE | INTRAMUSCULAR | Status: DC
  Administered 2023-08-31 – 2023-09-07 (×8): 40 mg via SUBCUTANEOUS
  Filled 2023-08-31 (×8): qty 0.4

## 2023-08-31 MED ORDER — FENTANYL BOLUS VIA INFUSION
25.0000 ug | INTRAVENOUS | Status: DC | PRN
  Administered 2023-09-01 – 2023-09-11 (×9): 25 ug via INTRAVENOUS

## 2023-08-31 MED ORDER — FENTANYL 2500MCG IN NS 250ML (10MCG/ML) PREMIX INFUSION
25.0000 ug/h | INTRAVENOUS | Status: DC
  Administered 2023-08-31: 25 ug/h via INTRAVENOUS
  Filled 2023-08-31: qty 250

## 2023-08-31 MED ORDER — DEXMEDETOMIDINE HCL IN NACL 400 MCG/100ML IV SOLN
0.0000 ug/kg/h | INTRAVENOUS | Status: DC
  Administered 2023-08-31: 0.5 ug/kg/h via INTRAVENOUS
  Administered 2023-08-31: 0.4 ug/kg/h via INTRAVENOUS
  Filled 2023-08-31 (×2): qty 100

## 2023-08-31 MED ORDER — PROPOFOL 1000 MG/100ML IV EMUL
5.0000 ug/kg/min | INTRAVENOUS | Status: DC
  Administered 2023-08-31: 25 ug/kg/min via INTRAVENOUS
  Administered 2023-09-01: 45 ug/kg/min via INTRAVENOUS
  Administered 2023-09-01: 35 ug/kg/min via INTRAVENOUS
  Administered 2023-09-01: 45 ug/kg/min via INTRAVENOUS
  Administered 2023-09-01 – 2023-09-02 (×3): 30 ug/kg/min via INTRAVENOUS
  Administered 2023-09-02: 25 ug/kg/min via INTRAVENOUS
  Administered 2023-09-02: 35 ug/kg/min via INTRAVENOUS
  Administered 2023-09-02: 25 ug/kg/min via INTRAVENOUS
  Administered 2023-09-02: 40 ug/kg/min via INTRAVENOUS
  Administered 2023-09-03 – 2023-09-04 (×10): 35 ug/kg/min via INTRAVENOUS
  Administered 2023-09-05: 30 ug/kg/min via INTRAVENOUS
  Administered 2023-09-05: 35 ug/kg/min via INTRAVENOUS
  Administered 2023-09-05 – 2023-09-06 (×6): 30 ug/kg/min via INTRAVENOUS
  Administered 2023-09-07: 35 ug/kg/min via INTRAVENOUS
  Administered 2023-09-07 (×2): 30 ug/kg/min via INTRAVENOUS
  Administered 2023-09-07 – 2023-09-08 (×2): 35 ug/kg/min via INTRAVENOUS
  Administered 2023-09-08 (×3): 30 ug/kg/min via INTRAVENOUS
  Administered 2023-09-08: 35 ug/kg/min via INTRAVENOUS
  Administered 2023-09-09: 40 ug/kg/min via INTRAVENOUS
  Administered 2023-09-09: 35.007 ug/kg/min via INTRAVENOUS
  Administered 2023-09-09 (×2): 40 ug/kg/min via INTRAVENOUS
  Administered 2023-09-09: 35 ug/kg/min via INTRAVENOUS
  Administered 2023-09-10: 30 ug/kg/min via INTRAVENOUS
  Administered 2023-09-10: 40 ug/kg/min via INTRAVENOUS
  Administered 2023-09-11: 25 ug/kg/min via INTRAVENOUS
  Administered 2023-09-11: 35 ug/kg/min via INTRAVENOUS
  Administered 2023-09-11 – 2023-09-15 (×19): 30 ug/kg/min via INTRAVENOUS
  Filled 2023-08-31 (×2): qty 100
  Filled 2023-08-31: qty 200
  Filled 2023-08-31 (×23): qty 100
  Filled 2023-08-31: qty 200
  Filled 2023-08-31 (×21): qty 100
  Filled 2023-08-31: qty 200
  Filled 2023-08-31 (×18): qty 100

## 2023-08-31 MED ORDER — FUROSEMIDE 10 MG/ML IJ SOLN
40.0000 mg | Freq: Two times a day (BID) | INTRAMUSCULAR | Status: DC
  Administered 2023-08-31: 40 mg via INTRAVENOUS
  Filled 2023-08-31: qty 4

## 2023-08-31 MED ORDER — IPRATROPIUM-ALBUTEROL 0.5-2.5 (3) MG/3ML IN SOLN
3.0000 mL | RESPIRATORY_TRACT | Status: AC
  Administered 2023-08-31 – 2023-09-02 (×12): 3 mL via RESPIRATORY_TRACT
  Filled 2023-08-31 (×12): qty 3

## 2023-08-31 MED ORDER — FENTANYL 2500MCG IN NS 250ML (10MCG/ML) PREMIX INFUSION
25.0000 ug/h | INTRAVENOUS | Status: DC
  Administered 2023-08-31: 25 ug/h via INTRAVENOUS
  Administered 2023-09-01: 75 ug/h via INTRAVENOUS
  Administered 2023-09-02 – 2023-09-05 (×3): 100 ug/h via INTRAVENOUS
  Administered 2023-09-06 – 2023-09-08 (×3): 75 ug/h via INTRAVENOUS
  Administered 2023-09-09 – 2023-09-10 (×3): 150 ug/h via INTRAVENOUS
  Administered 2023-09-11 (×2): 200 ug/h via INTRAVENOUS
  Filled 2023-08-31 (×11): qty 250

## 2023-08-31 MED ORDER — NOREPINEPHRINE 4 MG/250ML-% IV SOLN
0.0000 ug/min | INTRAVENOUS | Status: DC
  Administered 2023-08-31: 2 ug/min via INTRAVENOUS
  Administered 2023-09-01 – 2023-09-02 (×2): 6 ug/min via INTRAVENOUS
  Administered 2023-09-03: 10 ug/min via INTRAVENOUS
  Administered 2023-09-03: 15 ug/min via INTRAVENOUS
  Administered 2023-09-03: 8 ug/min via INTRAVENOUS
  Administered 2023-09-04: 6 ug/min via INTRAVENOUS
  Filled 2023-08-31: qty 250
  Filled 2023-08-31: qty 500
  Filled 2023-08-31 (×4): qty 250

## 2023-08-31 MED ORDER — ZOLPIDEM TARTRATE 5 MG PO TABS: 5.0000 mg | ORAL_TABLET | Freq: Every evening | ORAL | Status: DC | PRN

## 2023-08-31 MED ORDER — DOXAZOSIN MESYLATE 2 MG PO TABS
2.0000 mg | ORAL_TABLET | Freq: Every day | ORAL | Status: AC
  Administered 2023-08-31 – 2023-09-02 (×3): 2 mg
  Filled 2023-08-31 (×3): qty 1

## 2023-08-31 MED ORDER — STERILE WATER FOR INJECTION IV SOLN
INTRAVENOUS | Status: DC
  Filled 2023-08-31 (×4): qty 1000
  Filled 2023-08-31: qty 150

## 2023-08-31 MED ORDER — LACTATED RINGERS IV BOLUS
500.0000 mL | Freq: Once | INTRAVENOUS | Status: AC
  Administered 2023-08-31: 500 mL via INTRAVENOUS

## 2023-08-31 MED ORDER — SODIUM CHLORIDE 0.9 % IV SOLN
0.0000 ug/kg/min | INTRAVENOUS | Status: DC
  Administered 2023-08-31: 3 ug/kg/min via INTRAVENOUS
  Administered 2023-09-01: 3.5 ug/kg/min via INTRAVENOUS
  Administered 2023-09-01 – 2023-09-02 (×2): 5 ug/kg/min via INTRAVENOUS
  Filled 2023-08-31 (×5): qty 20

## 2023-08-31 MED ORDER — JEVITY 1.5 CAL/FIBER PO LIQD
1000.0000 mL | ORAL | Status: DC
Start: 2023-08-31 — End: 2023-09-07
  Administered 2023-08-31 – 2023-09-06 (×8): 1000 mL
  Filled 2023-08-31 (×13): qty 1000

## 2023-08-31 MED ORDER — VECURONIUM BROMIDE 10 MG IV SOLR
10.0000 mg | Freq: Once | INTRAVENOUS | Status: AC
  Administered 2023-08-31: 10 mg via INTRAVENOUS
  Filled 2023-08-31: qty 10

## 2023-08-31 MED ORDER — DOCUSATE SODIUM 50 MG/5ML PO LIQD
100.0000 mg | Freq: Two times a day (BID) | ORAL | Status: DC
  Administered 2023-08-31 – 2023-09-01 (×4): 100 mg
  Filled 2023-08-31 (×5): qty 10

## 2023-08-31 MED ORDER — MIDAZOLAM HCL 2 MG/2ML IJ SOLN: 2.0000 mg | INTRAMUSCULAR | Status: DC | PRN

## 2023-08-31 MED ORDER — MIDAZOLAM HCL 2 MG/2ML IJ SOLN
1.0000 mg | INTRAMUSCULAR | Status: DC | PRN
  Administered 2023-08-31: 2 mg via INTRAVENOUS
  Administered 2023-09-06: 1 mg via INTRAVENOUS
  Filled 2023-08-31 (×2): qty 2

## 2023-08-31 NOTE — Progress Notes (Signed)
 PROGRESS NOTE    Joshua Eaton  FMW:[id number] DOB: 05-19-1956 DOA: 08/13/2023 PCP: Delores Corean Pollen, MD  Brief Narrative:   67 yo M w/ pertinent PMH chronic respiratory failure w/ tracheostomy/[facility], Afib on eliquis  presented to [facility] ED on 12/13 w/ GIB.   Patient recently admitted w/ respiratory failure and pna in October 2024 requiring tracheostomy initially on 06/18/23. [facility] was [facility]. Later that day had an accidental trach dislodgement. His wob continued to increase requiring re-admission to [facility] on 10/31 and was intubated and then underwent redo trach on 07/05/23.  He was noted to have some tracheal ring damage and has had intermittent bleeding with clots from suction. [facility].    On 12/13 patient admitted to [facility] w/ drop in hgb and concern for GIB. GI consulted for possible endoscopy.  Patient underwent EGD on 08/15/2023 showing duodenitis and mucosal changes in the gastric antrum concerning for ulcer as well.   Bronchoscopy performed 08/16/2023 with small clots removed from distal end of trach; trach tip close proximity to membranous posterior wall; Bivona adjustable length trach was ordered.   Bronchoscopy repeated on 08/30/2023-trachea showed erythema and mild edema.  Airways were clear with no mucous plugging bleeding or secretions.  End of the tracheostomy was abutting the posterior wall of the trachea but not completely occluded Respiratory culture growing Serratia, AFB and fungus culture with stain pending BAL done  Assessment & Plan:   Principal Problem:   Acute GI bleeding Active Problems:   Acute respiratory failure with hypoxia (HCC)   Tracheostomy in place [facility])   Acute post-hemorrhagic anemia   PEG (percutaneous endoscopic gastrostomy) status (HCC)   Paroxysmal atrial fibrillation (HCC)   DVT (deep venous thrombosis) (HCC)   AKI (acute kidney injury)  (HCC)   Sacral ulcer (HCC)   Severe mitral regurgitation   On mechanically assisted ventilation (HCC)   Tracheostomy dependence (HCC)   Moderate aortic insufficiency   Malnutrition of moderate degree   HTN (hypertension)   Rash   Upper GI bleed   Abnormal finding on GI tract imaging   Cough   Hemoptysis   #1 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  This has been stable and resolving.  Hemoglobin is stable platelets are improving.  He is status post ENT evaluation by Dr. Carlie with fiberoptic tracheoscopy on 08/23/2023 with mild streaks of mucus lining the tracheal wall but no bleeding or significant erosions were noted. Status post bronch x 2 08/16/2023 and 08/30/2023 He was being treated with lidocaine  nebs and Zosyn  and Zyvox .  He got 5-day course of Zyvox  and 6-day course of Zosyn  and these were stopped. Bivona trach placed on 08/29/2023 Chest x-ray on 08/30/2023 with diffuse bilateral airspace disease Patient had bronc on the same day 08/30/2023 respiratory cultures growing Serratia Meropenem  1 g Q8 started 08/31/2023 PCCM following. Leukocytosis trending up 17.3 from 8.9 ABG 7.2 5/85/56 on 100% FiO2 Respiratory virus panel, COVID pending Continue Lasix  Started on Precedex  08/31/2023 due to increased agitation and restlessness had to be suctioned and mucous plugs removed  I called [phone] option #2 [facility] transfer line discussed with Dr.Netjian ICU intensivist discussed the case she said they are at capacity and have several patients waiting in the ED for beds they are not expecting any discharge from the ICU today and to call daily to see if  bed would be available.  #2 high blood secondary to duodenitis upper GI bleed he received a unit of blood transfusion on 08/25/2023.  Since then hemoglobin has remained stable.   CT angiogram showed active bleeding from the  gastric antrum pylorus with duodenitis.  Gastric biopsy was negative.  Per GI continue high-dose PPI indefinitely since he will need retrial of DOAC.  Apixaban  on hold.  Continue to monitor H&H.   #3 Hx RUE DVT 06/16/23-Eliquis  on hold. - repeat RUE duplex performed 12/23: no further DVT noted (prior RUE DVT noted on 10/16 initially) -Left upper extremity duplex from 08/14/2023 showed age-indeterminate SVT involving left cephalic vein, no DVT  #4 AKI -resolved   #5 severe MR w/ leaflet perforation and moderate AI -not operative candidate until for significant rehabilitation.    #6 paroxysmal Afib on amiodarone  200 mg daily Eliquis  on hold   #7 hyperbilirubinemia-resolved  - no evidence of occlusive gallstones causing ductal dilation  previously has had cholecystectomy  #8 anxiety -Ativan  and Klonopin  dose increased.  Continue Ambien .  Continue Seroquel  and sertraline .   #9 rash on left  knee-- tinea treated topical antifungal for 2 weeks   #10 petechial rash on hands and feet; blood cultures and serologies negative so far-he was seen by ID for this  Unclear what has caused probably related to antibiotic   #11 thrombocytopenia-finally resolved   #12 mild hyperglycemia-likely from tube feeds continue SSI last A1c was 4.3.   #13 Unstagable pressure ulcer on sacrum POA-frequent turning optimize nutrition and wound care following   #14 moderate protein energy malnutrition  -TF   #15 severe deconditioning  -PT, OT   #16 diarrhea likely related to tube feedings Rectal tube was placed on 08/29/2023 Fiber added to bulk the stool   Antimicrobials: Anti-infectives (From admission, onward)    Start     Dose/Rate Route Frequency Ordered Stop   08/31/23 1245  meropenem  (MERREM ) 1 g in sodium chloride  0.9 % 100 mL IVPB        1 g 200 mL/hr over 30 Minutes Intravenous Every 8 hours 08/31/23 1240     08/25/23 2000  linezolid  (ZYVOX ) IVPB 600 mg        600 mg 300 mL/hr over 60 Minutes  Intravenous Every 12 hours 08/25/23 1153 08/30/23 1951   08/23/23 2000  vancomycin  (VANCOCIN ) IVPB 1000 mg/200 mL premix  Status:  Discontinued        1,000 mg 200 mL/hr over 60 Minutes Intravenous Every 12 hours 08/23/23 0531 08/25/23 1153   08/23/23 0730  vancomycin  (VANCOCIN ) IVPB 1000 mg/200 mL premix       Placed in Followed by Linked Group   1,000 mg 200 mL/hr over 60 Minutes Intravenous  Once 08/23/23 0531 08/23/23 0854   08/23/23 0630  piperacillin -tazobactam (ZOSYN ) IVPB 3.375 g  Status:  Discontinued        3.375 g 12.5 mL/hr over 240 Minutes Intravenous Every 8 hours 08/23/23 0531 08/29/23 0909   08/23/23 0630  vancomycin  (VANCOCIN ) IVPB 1000 mg/200 mL premix       Placed in Followed by Linked Group   1,000 mg 200 mL/hr over 60 Minutes Intravenous  Once 08/23/23 0531 08/23/23 0720          DVT prophylaxis:  SCDs Start: 08/13/23   Code Status:Full Code  Pressure Injury 08/14/23 Sacrum Posterior;Medial Unstageable - Full thickness tissue loss in which the base of the injury is covered by slough (yellow, tan, gray, green or brown) and/or  eschar (tan, brown or black) in the wound bed. (Active)  08/14/23 0951  Location: Sacrum  Location Orientation: Posterior;Medial  Staging: Unstageable - Full thickness tissue loss in which the base of the injury is covered by slough (yellow, tan, gray, green or brown) and/or eschar (tan, brown or black) in the wound bed.  Wound Description (Comments):   Present on Admission: Yes  Dressing Type Foam - Lift dressing to assess site every shift 08/31/23 0800    Nutrition Problem: Moderate Malnutrition Etiology: chronic illness  Signs/Symptoms: severe muscle depletion, moderate muscle depletion, mild fat depletion  Interventions: Tube feeding, Refer to RD note for recommendations  Estimated body mass index is 28.39 kg/m as calculated from the following:   Height as of this encounter: 6' 0.99 (1.854 m).   Weight as of this  encounter: 97.6 kg.  DVT prophylaxis: scd code Status: Full code Family Communication: Wife at bedside  disposition Plan:  Status is: Inpatient Remains inpatient appropriate because: Acute illness   Consultants:  PCCM and ENT  Procedures: EGD on 08/15/2023  Bronchoscopy 08/16/2023  Fiberoptic  tracheoscopy 08/23/2023  Bivona placed 08/29/2023 Rectal tube placed 08/29/2023 Bronchoscopy with BAL 08/30/2023 CT chest 08/31/2023  Antimicrobials:  Anti-infectives (From admission, onward)    Start     Dose/Rate Route Frequency Ordered Stop   08/31/23 1245  meropenem  (MERREM ) 1 g in sodium chloride  0.9 % 100 mL IVPB        1 g 200 mL/hr over 30 Minutes Intravenous Every 8 hours 08/31/23 1240     08/25/23 2000  linezolid  (ZYVOX ) IVPB 600 mg        600 mg 300 mL/hr over 60 Minutes Intravenous Every 12 hours 08/25/23 1153 08/30/23 1951   08/23/23 2000  vancomycin  (VANCOCIN ) IVPB 1000 mg/200 mL premix  Status:  Discontinued        1,000 mg 200 mL/hr over 60 Minutes Intravenous Every 12 hours 08/23/23 0531 08/25/23 1153   08/23/23 0730  vancomycin  (VANCOCIN ) IVPB 1000 mg/200 mL premix       Placed in Followed by Linked Group   1,000 mg 200 mL/hr over 60 Minutes Intravenous  Once 08/23/23 0531 08/23/23 0854   08/23/23 0630  piperacillin -tazobactam (ZOSYN ) IVPB 3.375 g  Status:  Discontinued        3.375 g 12.5 mL/hr over 240 Minutes Intravenous Every 8 hours 08/23/23 0531 08/29/23 0909   08/23/23 0630  vancomycin  (VANCOCIN ) IVPB 1000 mg/200 mL premix       Placed in Followed by Linked Group   1,000 mg 200 mL/hr over 60 Minutes Intravenous  Once 08/23/23 0531 08/23/23 0720        Subjective:   He had a rough night restless agitated lots of secretions wife at bedside Lasix  and CT chest ordered Precedex  started  Objective: Vitals:   08/31/23 1043 08/31/23 1152 08/31/23 1154 08/31/23 1155  BP:      Pulse:   75   Resp:   (!) 33   Temp:  97.6 F (36.4 C)    TempSrc:   Axillary    SpO2: 98%  98% 99%  Weight:      Height:        Intake/Output Summary (Last 24 hours) at 08/31/2023 1257 Last data filed at 08/31/2023 1000 Gross per 24 hour  Intake 1321 ml  Output 900 ml  Net 421 ml   Filed Weights   08/29/23 0322 08/30/23 0500 08/31/23 0435  Weight: 95.1 kg 97.1 kg 97.6 kg  Examination:  General exam: Appears in distress restless respiratory system: Coarse breath sounds  cardiovascular system: Regular tachy gastrointestinal system: Abdomen is nondistended, soft and nontender. No organomegaly or masses felt. Normal bowel sounds heard.  PEG in place Central nervous system: Restless extremities: No edema. Data Reviewed: I have personally reviewed following labs and imaging studies  CBC: Recent Labs  Lab 08/25/23 0510 08/26/23 0454 08/27/23 0327 08/28/23 0345 08/29/23 0315 08/31/23 1007 08/31/23 1023  WBC 8.6 8.2 7.4 7.5 8.9 17.3*  --   NEUTROABS 7.1 6.2  --   --   --   --   --   HGB 7.3* 9.0* 8.4* 8.4* 8.8* 8.9* 9.5*  HCT 22.7* 27.2* 26.6* 26.9* 27.7* 28.8* 28.0*  MCV 95.0 91.9 94.0 93.7 93.6 96.0  --   PLT 91* 104* 103* 108* 120* 159  --    Basic Metabolic Panel: Recent Labs  Lab 08/25/23 0510 08/26/23 0454 08/27/23 0327 08/28/23 0345 08/29/23 0315 08/30/23 0355 08/31/23 1007 08/31/23 1023  NA 135 138 137 138 137 138 138 138  K 4.0 3.9 3.8 3.8 4.1 4.0 5.0 5.0  CL 98 97* 99 98 99 99 100  --   CO2 29 30 31 30 31  32 32  --   GLUCOSE 138* 138* 111* 132* 116* 138* 148*  --   BUN 50* 47* 40* 36* 34* 34* 42*  --   CREATININE 1.25* 1.14 1.09 1.08 1.01 0.97 1.01  --   CALCIUM 8.2* 8.1* 8.0* 8.2* 8.5* 8.2* 8.9  --   MG 2.2 2.1 2.2 2.2 2.1  --   --   --    GFR: Estimated Creatinine Clearance: 84.9 mL/min (by C-G formula based on SCr of 1.01 mg/dL). Liver Function Tests: Recent Labs  Lab 08/30/23 0355 08/31/23 1007  AST 18 21  ALT 15 18  ALKPHOS 49 56  BILITOT 0.9 1.1  PROT 5.1* 6.3*  ALBUMIN  1.7* 2.1*    No results  for input(s): LIPASE, AMYLASE in the last 168 hours. No results for input(s): AMMONIA in the last 168 hours. Coagulation Profile: No results for input(s): INR, PROTIME in the last 168 hours. Cardiac Enzymes: No results for input(s): CKTOTAL, CKMB, CKMBINDEX, TROPONINI in the last 168 hours. BNP (last 3 results) No results for input(s): PROBNP in the last 8760 hours. HbA1C: No results for input(s): HGBA1C in the last 72 hours. CBG: Recent Labs  Lab 08/30/23 1950 08/30/23 2329 08/31/23 0259 08/31/23 0802 08/31/23 1150  GLUCAP 146* 118* 115* 156* 151*   Lipid Profile: No results for input(s): CHOL, HDL, LDLCALC, TRIG, CHOLHDL, LDLDIRECT in the last 72 hours. Thyroid  Function Tests: No results for input(s): TSH, T4TOTAL, FREET4, T3FREE, THYROIDAB in the last 72 hours. Anemia Panel: No results for input(s): VITAMINB12, FOLATE, FERRITIN, TIBC, IRON, RETICCTPCT in the last 72 hours. Sepsis Labs: Recent Labs  Lab 08/25/23 0510  PROCALCITON 11.25    Recent Results (from the past 240 hours)  Culture, blood (Routine X 2) w Reflex to ID Panel     Status: None   Collection Time: 08/23/23 11:34 AM   Specimen: BLOOD RIGHT HAND  Result Value Ref Range Status   Specimen Description BLOOD RIGHT HAND  Final   Special Requests   Final    BOTTLES DRAWN AEROBIC ONLY Blood Culture results may not be optimal due to an inadequate volume of blood received in culture bottles   Culture   Final    NO GROWTH 5 DAYS Performed at [facility], [address], [place], [place] [address]    Report Status 08/28/2023 FINAL  Final  Culture, blood (Routine X 2) w Reflex to ID Panel     Status: None   Collection Time: 08/23/23 11:42 AM   Specimen: BLOOD RIGHT ARM  Result Value Ref Range Status   Specimen Description BLOOD RIGHT ARM  Final   Special Requests   Final    BOTTLES DRAWN AEROBIC AND ANAEROBIC Blood Culture adequate volume    Culture   Final    NO GROWTH 5 DAYS Performed at [facility], [address] [place], [place] [address]    Report Status 08/28/2023 FINAL  Final  Culture, Respiratory w Gram Stain     Status: None (Preliminary result)   Collection Time: 08/30/23  9:02 AM   Specimen: Tracheal Aspirate; Respiratory  Result Value Ref Range Status   Specimen Description TRACHEAL ASPIRATE  Final   Special Requests Normal  Final   Gram Stain   Final    ABUNDANT WBC PRESENT, PREDOMINANTLY PMN RARE GRAM NEGATIVE RODS INTRACELLULAR Performed at [facility], [address], [place], [place] [address]    Culture MODERATE SERRATIA MARCESCENS  Final   Report Status PENDING  Incomplete  Culture, Respiratory w Gram Stain     Status: None (Preliminary result)   Collection Time: 08/30/23  3:18 PM   Specimen: Bronchoalveolar Lavage; Respiratory  Result Value Ref Range Status   Specimen Description BRONCHIAL ALVEOLAR LAVAGE  Final   Special Requests NONE  Final   Gram Stain   Final    ABUNDANT WBC PRESENT, PREDOMINANTLY PMN NO ORGANISMS SEEN    Culture   Final    MODERATE SERRATIA MARCESCENS SUSCEPTIBILITIES TO FOLLOW Performed at [facility], [address] [place], [place] [address]    Report Status PENDING  Incomplete         Radiology Studies: DG CHEST PORT 1 VIEW Result Date: 08/31/2023 CLINICAL DATA:  Acute respiratory failure. EXAM: PORTABLE CHEST 1 VIEW COMPARISON:  August 30, 2023. FINDINGS: Stable cardiomediastinal silhouette. Tracheostomy tube and right internal jugular catheter are unchanged. Able bilateral diffuse lung opacities are noted concerning for edema or pneumonia. Bilateral pleural effusions are noted. Bony thorax is unremarkable. IMPRESSION: Stable support apparatus. Stable bilateral lung opacities and effusions. Electronically Signed   By: Lynwood Landy Raddle M.D.   On: 08/31/2023 10:00   DG CHEST PORT 1 VIEW Result Date: 08/30/2023 CLINICAL DATA:  Acute  respiratory failure. EXAM: PORTABLE CHEST 1 VIEW COMPARISON:  Chest x-ray from yesterday. FINDINGS: Unchanged tracheostomy tube and right internal jugular central venous catheter. Stable cardiomediastinal silhouette. Diffuse airspace opacities throughout both lungs are not significantly changed. No pneumothorax or large pleural effusion. No acute osseous abnormality. IMPRESSION: 1. Unchanged diffuse bilateral airspace disease. Electronically Signed   By: Elsie ONEIDA Shoulder M.D.   On: 08/30/2023 13:50   DG Chest 1 View Result Date: 08/29/2023 CLINICAL DATA:  Cough EXAM: PORTABLE CHEST 1 VIEW COMPARISON:  08/23/2023 FINDINGS: Cardiac shadow remains enlarged. Tracheostomy tube is again seen in satisfactory position. Right jugular central line is noted. Increase in patchy airspace opacity is noted bilaterally when compare with the prior exam no sizable effusion is seen. No bony abnormality is noted. IMPRESSION: Increase in bilateral airspace opacities bilaterally. Electronically Signed   By: Oneil Devonshire M.D.   On: 08/29/2023 16:59     Scheduled Meds:  sodium chloride    Intravenous Once   sodium chloride   Intravenous Once   amiodarone   200 mg Per Tube Daily   Chlorhexidine  Gluconate Cloth  6 each Topical Daily   docusate  100 mg Per Tube BID   doxazosin   2 mg Per Tube Daily   enoxaparin  (LOVENOX ) injection  40 mg Subcutaneous Q24H   fentaNYL  (SUBLIMAZE ) injection  25 mcg Intravenous Once   fiber supplement (BANATROL TF)  60 mL Per Tube BID   free water   150 mL Per Tube Q4H   gabapentin   100 mg Per Tube Q8H   insulin  aspart  0-15 Units Subcutaneous Q4H   leptospermum manuka honey  1 Application Topical Daily   nutrition supplement (JUVEN)  1 packet Per Tube BID BM   mouth rinse  15 mL Mouth Rinse Q2H   pantoprazole  (PROTONIX ) IV  40 mg Intravenous Q12H   polyethylene glycol  17 g Per Tube Daily   QUEtiapine   50 mg Per Tube TID   sertraline   100 mg Per Tube Daily   vecuronium   10 mg Intravenous  Once   zolpidem   5 mg Per Tube QHS   Continuous Infusions:  dexmedetomidine  (PRECEDEX ) IV infusion 0.4 mcg/kg/hr (08/31/23 1000)   feeding supplement (JEVITY 1.5 CAL/FIBER)     fentaNYL  infusion INTRAVENOUS 25 mcg/hr (08/31/23 1046)   lactated ringers      meropenem  (MERREM ) IV     promethazine (PHENERGAN) injection (IM or IVPB)       LOS: 17 days    Time spent: 39 min   Almarie KANDICE Hoots, MD  08/31/2023, 12:57 PM

## 2023-08-31 NOTE — Progress Notes (Addendum)
 PCCM note  Patient has worsening ABG with pH 7.14, pCO2 113, pO2 116 Unable to tolerate diuresis due to low blood pressure He has been receiving intermittent paralytics and will need to start continuous paralytic to help ventilate Switch from pressure control to PRVC.  Peak pressures are in the 40s but plateau pressure is acceptable at 30 Start bicarb drip, levo if needed for hypotension Repeat ABG in 2-3 hours.  Additional CC time- 30 mins Kameshia Madruga MD [facility] See Amion for pager  If no response to pager , please call [phone] until 7pm After 7:00 pm call Elink  [phone] 08/31/2023, 5:31 PM

## 2023-08-31 NOTE — Progress Notes (Signed)
RT NOTE: RT transported patient on ventilator from room 3M11 to CT and back to room 3M11 with no complications.

## 2023-08-31 NOTE — Progress Notes (Signed)
 OT Cancellation Note  Patient Details Name: Joshua Eaton MRN: [id number] DOB: [birth year]   Cancelled Treatment:    Reason Eval/Treat Not Completed: Patient not medically ready. Spoke with pt's RN and observed pt and his door--pt with labored breathing on vent and RN reports pt is going for chest CT as well.  Donny BECKER OT Acute Rehabilitation Services Office [phone]    [place] Dorothyann Distel 08/31/2023, 10:28 AM

## 2023-08-31 NOTE — Progress Notes (Signed)
 Nutrition Follow-up  DOCUMENTATION CODES:   Non-severe (moderate) malnutrition in context of chronic illness  INTERVENTION:  Continue TF via PEG: Change to Jevity 1.5 at 2ml/hr ( per day) 60ml Prosource TF20 BID q4h free water  flushes ( per day) Provides 2320 kcal, 132g protein, free water  daily   Continue 1 packet Juven BID per tube, each packet provides 95 calories, 2.5 grams of protein (collagen), and 9.8 grams of carbohydrate (3 grams sugar) to support wound healing  Banatrol BID-provides 45kcal, 5g soluble fiber and 2g protein per serving.  NUTRITION DIAGNOSIS:   Moderate Malnutrition related to chronic illness as evidenced by severe muscle depletion, moderate muscle depletion, mild fat depletion. - remains applicable  GOAL:   Patient will meet greater than or equal to 90% of their needs - goal met via TF  MONITOR:   Vent status, TF tolerance, Skin  REASON FOR ASSESSMENT:   Consult Enteral/tube feeding initiation and management  ASSESSMENT:   67 yo male admitted with possible GI Bleed from [facility]; pt with hx of chronic respiratory failure requiring chronic trach/vent. Noted pt with prolonged hospital stay on October for pneumonia, severe MR, pulmonary edema requiring trach/PEG placement. PMH includes HTN, depression/amxiety, OCD  12/15 - EGD showed ulcer around PEG site but no active bleeding  12/16 bronchoscopy- plugging with clots- posterior wall bleeding from distal end of trach 12/30 - mucous plugging; s/p bronchoscopy  Pt off unit at time of visit for imaging.  Discussed in IDT rounds.  PMT following, continues with full scope of care.   TF held for transfer to imaging. Continues with loose stools, banatrol added yesterday. Will transition to a fiber containing formula and monitor for improvements.  Patient remains intubated on ventilator support via trach MV: 10.7 L/min Temp (24hrs), Avg:98.6 F (37 C), Min:97.6 F (36.4  C), Max:99.7 F (37.6 C)  Admit weight: 86.6 kg Current weight: 97.6 kg + edema: mild generalized, BUE, BLE  Drains/lines: FMS (placed 12/29) PEG tube  Medications: colace BID, lasix  40mg  q12h, SSI 0-15 units q4h, protonix , miralax  Drips:  Precedex  fentanyl   Labs:  CBG's 115-156 x24 hours  Diet Order:   Diet Order             Diet NPO time specified  Diet effective now                   EDUCATION NEEDS:   Not appropriate for education at this time  Skin:  Skin Integrity Issues:: Unstageable Unstageable: sacrum Other: round rash on L knee; Petechial rash on hands and fee  Last BM:  12/25 type 7  Height:   Ht Readings from Last 1 Encounters:  08/18/23 6' 0.99 (1.854 m)    Weight:   Wt Readings from Last 1 Encounters:  08/31/23 97.6 kg   BMI:  Body mass index is 28.39 kg/m.  Estimated Nutritional Needs:   Kcal:  2200-2400 kcals  Protein:  125-150 g  Fluid:  >/= 2L  Royce Maris, RDN, LDN Clinical Nutrition

## 2023-08-31 NOTE — Progress Notes (Signed)
 SLP Cancellation Note  Patient Details Name: Joshua Eaton MRN: [id number] DOB: 17-Jan-1956   Cancelled treatment:        Attempted to see pt for speaking valve trials. Spoke with RN. Pt remains on vent. Pt required bronchoscopy yesterday, respiratory status remains tenuous.  Pt is not appropriate at this time. SLP will sign off.  Please reconsult when pt is tolerating TCT and medically ready for PMSV trials.    Anette FORBES Grippe, MA, CCC-SLP Acute Rehabilitation Services Office: [phone] 08/31/2023, 9:18 AM

## 2023-08-31 NOTE — Progress Notes (Signed)
RT NOTE: RT called by lab with critical ABG results. This RT relayed results to CCM. Awaiting new orders at this time.

## 2023-08-31 NOTE — Progress Notes (Signed)
 RT note: RT made vent changes per Viscu MD. MD aware of PIP/Plateau pressures and patients is air trapping by 7. Patient stable at this time ICU RT aware of changes made.

## 2023-08-31 NOTE — Progress Notes (Signed)
RT NOTE: ventilator changes made by MD.  Per MD, will give another paralytic and get ABG once given.  Unit RT aware.  Will continue to monitor.

## 2023-08-31 NOTE — Progress Notes (Addendum)
 eLink Physician-Brief Progress Note Patient Name: Joshua Eaton DOB: 1956-01-22 MRN: [id number]   Date of Service  08/31/2023  HPI/Events of Note  Worsening complaints today with a worsening hypercapnia and pH dropped to 7.14 with pCO2 113.  Required paralytics and bicarb infusion. CT reviewed, dense consolidations noted within the right middle and lower lobes.  Peak pressures are significantly higher tonight.  After prolonging I time and adjusting minimal elation as best as tolerated:    eICU Interventions  Maintain bicarb infusion for compensatory effect.  Initiate paralytic drip, status post 2 times pushes earlier in the day  Will repeat ABG 1 hour after paralytics are administered  Although high peak pressures are primarily secondary to high plateau pressures, we will initiate scheduled SVNs for the next 24-48 hours   2153 -currently sedated with Precedex  and fentanyl  infusions with a bis in the 30s.  Although this is adequate sedation, Precedex  is generally not preferred for longer-term sedation as is expected with the paralytic infusion.  Transition to propofol  over the course of the night with ongoing bis monitoring.  Maintain Seroquel , hold Ambien   2317 -discussed titration goal per order set of train-of-four's x 2 or ventilator synchrony.  Can titrate down on the max based off these parameters.  Additionally, with low-dose propofol , base in the low teens.  Will switch to the standard propofol  titration order so baseline can be below 25 mcg of propofol .  0021 -  ABG reviewed, maintain current settings.  Wean oxygen for sat greater than 90%.  9554 -patient developed constant vent alarms.  On examination, he has an audible air leak and a substantial leak present on the vent. He has been struggling to ventilate as it is.  Will request ground team evaluation.  Attempted to inflate the balloon with bedside staff with minimal benefit.  Balloon pressure reported in the high range.  Bivona  replacement tracheostomy is unavailable especially in different sizes.  Will have the team grab distal XLT in 6.0 and 8.0.  Will request bronchoscopy at bedside.  I have a backup endotracheal tube available.  9457 -underwent tracheostomy repositioning with significant improvement in peak/plateau pressures.  Still has a substantial air leak.  Chest radiograph pending     Intervention Category Intermediate Interventions: Respiratory distress - evaluation and management  Deno Sida 08/31/2023, 7:54 PM

## 2023-08-31 NOTE — Progress Notes (Signed)
  Echocardiogram 2D Echocardiogram has been performed.  Ocie Doyne RDCS 08/31/2023, 3:29 PM

## 2023-08-31 NOTE — Progress Notes (Signed)
 Pharmacy Antibiotic Note  Keymarion Bearman is a 67 y.o. male admitted on 08/13/2023 with GI bleed. Patient is chronic trach with recurrent pneumonia.  Pharmacy has been consulted for Meropenem  dosing.  Patient recently finished a course of Linezolid  and Zosyn . He has had increasing dyspnea, tachypnea, and desaturations over the past 48 hours. Patient underwent bronchoscopy on 12/31.   Plan: Meropenem  1000 mg q8h  Follow cultures, susceptibilities, and length of therapy   Height: 6' 0.99 (185.4 cm) Weight: 97.6 kg (215 lb 2.7 oz) IBW/kg (Calculated) : 79.88  Temp (24hrs), Avg:98.6 F (37 C), Min:97.6 F (36.4 C), Max:99.7 F (37.6 C)  Recent Labs  Lab 08/26/23 0454 08/27/23 0327 08/28/23 0345 08/29/23 0315 08/30/23 0355 08/31/23 1007  WBC 8.2 7.4 7.5 8.9  --  17.3*  CREATININE 1.14 1.09 1.08 1.01 0.97 1.01    Estimated Creatinine Clearance: 84.9 mL/min (by C-G formula based on SCr of 1.01 mg/dL).    Allergies  Allergen Reactions   Codeine     sends me into orbit    Antimicrobials this admission:  12/31 Meropenem  >>  12/25 Linezolid  >>12/30 12/23 Zosyn  >>12/29 12/23 Vanc >> 12/25   Microbiology results:  12/31 BAL: SERRATIA MARCESCENS  12/30 TA: SERRATIA MARCESCENS   12/23 BCx: negative 12/16 RPR, HIV, RVP: neg  12/16 BCX ng x2 12/16 GC/Chlamydia neg 12/15 resp cx: MRSA 12/14 MRSA pos 12/12 TA: MRSA, coryne  Thank you for allowing pharmacy to be a part of this patient's care.  Rankin Sams 08/31/2023 12:42 PM

## 2023-08-31 NOTE — Progress Notes (Signed)
 NAME:  Joshua Eaton, MRN:  [id number], DOB:  07-14-1956, LOS: 17 ADMISSION DATE:  08/13/2023, CONSULTATION DATE:  12/13 REFERRING MD:  Dr. Yolande, CHIEF COMPLAINT:  GIB   History of Present Illness:  Patient is a 67 yo M w/ pertinent PMH chronic respiratory failure w/ trache[facility], Afib on eliquis  presents to [facility] ED on 12/13 w/ GIB.  Patient recently admitted w/ respiratory failure and pna in October 2024 requiring tracheostomy. [facility] was [facility]. Later that day had an accidental trach dislodgement. His wob continued to increase requiring re-admission to [facility] on 10/31 and was intubated and later trached during hospital admission. [facility].   On 12/13 patient admitted to [facility] w/ drop in hgb and concern for GIB. GI consulted for possible endoscopy. TRH to admit and PCCM to consult for vent management.  Pertinent  Medical History   Past Medical History:  Diagnosis Date   Anxiety    Cancer (HCC)    Depression    Hypertension    OCD (obsessive compulsive disorder)    Significant Hospital Events: Including procedures, antibiotic start and stop dates in addition to other pertinent events   12/13 admitted w/ GIB. Pccm to consult for vent management 12/14 1 unit pRBC 12/15 EGD - erosion around PEG site but no active bleeding; cratered ulcer biopsied near peg site.  12/16 plugging with clots- posterior wall bleeding from distal end of trach 12/23 family fired pccm 12/24 pccm asked to re-engage by ENT via RN staff .  12/29 # 7 adjustable length cuffed biovona placed set at 110 mm wife updated  12/30 Bedside bronchoscopy with BAL of RML  Interim History / Subjective:   Had issues with increased secretions, agitation Had to be suctioned and mucous plugs removed Underwent bronchoscopy yesterday.  Objective   Blood pressure (!) 156/70, pulse 99, temperature 99.7 F (37.6 C),  temperature source Axillary, resp. rate (!) 37, height 6' 0.99 (1.854 m), weight 97.6 kg, SpO2 (!) 88%.    Vent Mode: PSV;CPAP FiO2 (%):  [40 %-100 %] 40 % Set Rate:  [16 bmp] 16 bmp Vt Set:  [540 mL] 540 mL PEEP:  [8 cmH20] 8 cmH20 Pressure Support:  [12 cmH20] 12 cmH20 Plateau Pressure:  [31 cmH20] 31 cmH20   Intake/Output Summary (Last 24 hours) at 08/31/2023 0916 Last data filed at 08/31/2023 0800 Gross per 24 hour  Intake 1380 ml  Output 900 ml  Net 480 ml   Filed Weights   08/29/23 0322 08/30/23 0500 08/31/23 0435  Weight: 95.1 kg 97.1 kg 97.6 kg    Examination: Gen:      Distress, chronically ill-appearing HEENT:  EOMI, sclera anicteric Neck:     No masses; no thyromegaly, #7 Bivona trach set at 110 mm Lungs:    Coarse rhonchi CV:         Regular rate and rhythm; no murmurs Abd:      + bowel sounds; soft, non-tender; no palpable masses, no distension Ext:    No edema; adequate peripheral perfusion Skin:      Warm and dry; no rash Neuro: Awake, follows commands  Lab/imaging reviewed  Chest x-ray with persistent bilateral airspace disease Repeat CBC  Resolved Hospital Problem list   Hypokalemia HTN AKI  Assessment & Plan:  Chronic resp failure Trach/vent dependence Tracheomalacia  Hemoptysis (resolved) secondary to sxn trauma  Cough ABLA 2/2 UGIB/ duodenitis, ongoing slow trend down in  Hb Elevated d-dimer despite treatment for DVT with [facility] for the past few months Acute kidney injury 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  Mild hyperglycemia Unstagable pressure ulcer on sacrum POA Moderate protein energy malnutrition  Severe deconditioning  Diarrhea   Pulm  prob list  Vent Trach dependence s/p prolonged critical illness complicated further by tracheostomy dislodgement, tracheostomy malpositioning (6 xlt against posterior tracheal wall w/ resultant ulceration and bleeding), tracheomalacia, and severe deconditioning superimposed on underlying severe MV disease (leaflet perf as well as AI).   Discussion  Evaluation of airway demonstrated that there is no longer any ulceration or injury of the airway but the tip  #6 xlt was indeed angled towards the posterior wall of the trach resulting in increased risk for sxn trauma as well as just potentially injury from trach itself. Also noted that he has fairly sig tracheomalacia. Now has adjustable # 7 biovona cuffed w/ length set at . The trach is now midline and about 4 cm above carina. This should help prevent further trauma in future.   Today with increased dyspnea, tachypnea and desats.  Plan/rec Chest x-ray reviewed with increase bilateral opacities.  Not sure if he has an infection has he is just completed a course of linezolid  Post bronchoscopy.  Follow BAL cultures Agree with Lasix  for diuresis.  Will increase dose to 40 mg every 12 Recheck labs, BNP Precedex  for sedation.  Check ABG Order CTA to rule out PE as he has been off anticoagulation and DVT prophylaxis out of concern for GI bleed Routine trach care, suctioning, chest PT Will need to keep 2 extra models of this trach at bedside and ensure that if he goes to another facility that they have the ability to order as well.  Will need to be changed q 30d  Wife updated at bedside.  Best practice:  Per primary   The patient is critically ill with multiple organ system failure and requires high complexity decision making for assessment and support, frequent evaluation and titration of therapies, advanced monitoring, review of radiographic studies and interpretation of complex data.   Critical Care Time devoted to patient care services,  exclusive of separately billable procedures, described in this note is 35 minutes.   Makinsley Schiavi MD [facility] See Amion for pager  If no response to pager , please call [phone] until 7pm After 7:00 pm call Elink  [phone] 08/31/2023, 9:39 AM

## 2023-08-31 NOTE — Progress Notes (Signed)
 Patient ID: Craige Patel, male   DOB: 02-27-1956, 67 y.o.   MRN: [id number]    Progress Note from the Palliative Medicine Team at [facility]   Patient Name: Jeniel Slauson        Date: 08/31/2023 DOB: 1956/06/02  Age: 67 y.o. MRN#: [id number] Attending Physician: Will Almarie MATSU, MD Primary Care Physician: Delores Corean Pollen, MD Admit Date: 08/13/2023   Reason for Consultation/Follow-up   Establishing Goals of Care   HPI/ Brief Hospital Review  Mr. Florea is a 67 yo M w/ pertinent PMH chronic respiratory failure w/ trache[facility], Afib on eliquis  presented to [facility] ED on 12/13 w/ GIB.   Patient recently admitted w/ respiratory failure and pna in October 2024 requiring tracheostomy. [facility] was [facility]. Later that day had an accidental trach dislodgement. His WOB continued to increase requiring re-admission to [facility] on 10/31 and was intubated and later trached during hospital admission. [facility].    On 12/13 patient admitted to [facility] w/ drop in hgb and concern for GIB. GI consulted for possible endoscopy.   Patient underwent EGD on 08/15/2023 showing duodenitis and mucosal changes in the gastric antrum concerning for ulcer as well.   Bronchoscopy performed 08/16/2023 with small clots removed from distal end of trach; trach tip close proximity to membranous posterior wall  08/17/2023 initial palliative care consult  08/29/23 adjustable Bivona placed  08/30/2023 bedside bronchoscopy  08/31/2023 decompensation with difficulty to ventilate, patient was sedated and trach repositioned   Family face ongoing treatment option decisions, advanced directive decisions and anticipatory care needs.   Subjective  Extensive chart review has been completed prior to meeting with patient/family  including labs, vital signs, imaging, progress/consult notes, orders, medications and available  advance directive documents.   This NP assessed patient at the bedside as a follow up to  yesterday's GOCs meeting.   Patient's wife at bedside.  Ongoing conversation regarding current medical situation specific to acute on chronic respiratory failure and vent dependency. Overall failure to thrive, high risk for frequent  infections, high risk for decompensation.   Initially I met with  patient's wife in the morning for continued conversation regarding her husband's serious current medical situation.  Ongoing concerns verbalized around overall medical care and family's request to transfer a trauma one  hospital.  Emotional support offered.  Questions and concerns addressed to the best my ability. Conversation had regarding need for family meeting in order to include both her wife, son and patient's sister along with pertinent treatment team members.  Wife agrees, I will coordinate with CCM          Later in the afternoon called to the bedside with family request to speak by phone with son/his wife and sister-in-law,  wife is in the room.  Fortunately, Jeralyn Banner, NP was already engaged with family offering detailed education regarding current medical situation recommendation for tracheostomy exchange.  Family verbalized an understanding and are in agreement with procedure.  I stay behind for further conversation and emotional support.  Therapeutic listening,  wife /family voice frustration with overall medical situation and experience.  Provided family with Patient Experience contact information.   They continue to ask for a transfer to any hospital in the area; [place], [place], [facility] or [place] area.  I discussed with Dr. Theophilus and he plans to reach out and try to assist with transfer.  He will follow-up with family  Family remain open to all offered and available medical interventions to prolong life.  They are hopeful for improvement   PMT will continue to support  holistically  Questions and concerns addressed   Discussed with primary team/nursing staff   Time:   65 minutes minutes  Detailed review of medical records ( labs, imaging, vital signs), medically appropriate exam ( MS, skin, cardiac,  resp)   discussed with treatment team, counseling and education to patient, family, staff, documenting clinical information, medication management, coordination of care    Ronal Plants NP  Palliative Medicine Team Team Phone # [phone] Pager [phone]

## 2023-08-31 NOTE — Plan of Care (Signed)
  Problem: Activity: Goal: Ability to tolerate increased activity will improve Outcome: Not Progressing   Problem: Respiratory: Goal: Ability to maintain a clear airway and adequate ventilation will improve Outcome: Not Progressing   Problem: Role Relationship: Goal: Method of communication will improve Outcome: Not Progressing   Problem: Education: Goal: Knowledge of General Education information will improve Description: Including pain rating scale, medication(s)/side effects and non-pharmacologic comfort measures Outcome: Not Progressing   Problem: Health Behavior/Discharge Planning: Goal: Ability to manage health-related needs will improve Outcome: Not Progressing   Problem: Clinical Measurements: Goal: Ability to maintain clinical measurements within normal limits will improve Outcome: Not Progressing Goal: Will remain free from infection Outcome: Not Progressing Goal: Diagnostic test results will improve Outcome: Not Progressing Goal: Respiratory complications will improve Outcome: Not Progressing Goal: Cardiovascular complication will be avoided Outcome: Not Progressing   Problem: Activity: Goal: Risk for activity intolerance will decrease Outcome: Not Progressing   Problem: Nutrition: Goal: Adequate nutrition will be maintained Outcome: Not Progressing   Problem: Coping: Goal: Level of anxiety will decrease Outcome: Not Progressing   Problem: Elimination: Goal: Will not experience complications related to bowel motility Outcome: Not Progressing Goal: Will not experience complications related to urinary retention Outcome: Not Progressing   Problem: Pain Management: Goal: General experience of comfort will improve Outcome: Not Progressing   Problem: Safety: Goal: Ability to remain free from injury will improve Outcome: Not Progressing   Problem: Skin Integrity: Goal: Risk for impaired skin integrity will decrease Outcome: Not Progressing   Problem:  Education: Goal: Ability to describe self-care measures that may prevent or decrease complications (Diabetes Survival Skills Education) will improve Outcome: Not Progressing Goal: Individualized Educational Video(s) Outcome: Not Progressing   Problem: Coping: Goal: Ability to adjust to condition or change in health will improve Outcome: Not Progressing   Problem: Fluid Volume: Goal: Ability to maintain a balanced intake and output will improve Outcome: Not Progressing   Problem: Health Behavior/Discharge Planning: Goal: Ability to identify and utilize available resources and services will improve Outcome: Not Progressing Goal: Ability to manage health-related needs will improve Outcome: Not Progressing   Problem: Metabolic: Goal: Ability to maintain appropriate glucose levels will improve Outcome: Not Progressing   Problem: Nutritional: Goal: Maintenance of adequate nutrition will improve Outcome: Not Progressing Goal: Progress toward achieving an optimal weight will improve Outcome: Not Progressing   Problem: Skin Integrity: Goal: Risk for impaired skin integrity will decrease Outcome: Not Progressing   Problem: Tissue Perfusion: Goal: Adequacy of tissue perfusion will improve Outcome: Not Progressing

## 2023-09-01 ENCOUNTER — Inpatient Hospital Stay (HOSPITAL_COMMUNITY): Payer: Medicare Other

## 2023-09-01 DIAGNOSIS — K922 Gastrointestinal hemorrhage, unspecified: Secondary | ICD-10-CM | POA: Diagnosis not present

## 2023-09-01 DIAGNOSIS — J80 Acute respiratory distress syndrome: Secondary | ICD-10-CM | POA: Diagnosis not present

## 2023-09-01 DIAGNOSIS — A419 Sepsis, unspecified organism: Secondary | ICD-10-CM

## 2023-09-01 DIAGNOSIS — J1569 Pneumonia due to other gram-negative bacteria: Secondary | ICD-10-CM | POA: Diagnosis not present

## 2023-09-01 DIAGNOSIS — J398 Other specified diseases of upper respiratory tract: Secondary | ICD-10-CM | POA: Diagnosis not present

## 2023-09-01 DIAGNOSIS — D696 Thrombocytopenia, unspecified: Secondary | ICD-10-CM | POA: Insufficient documentation

## 2023-09-01 LAB — RESPIRATORY PANEL BY PCR

## 2023-09-01 LAB — BLOOD GAS, ARTERIAL
Acid-Base Excess: 6.9 mmol/L — ABNORMAL HIGH (ref 0.0–2.0)
Bicarbonate: 38.7 mmol/L — ABNORMAL HIGH (ref 20.0–28.0)
Drawn by: 24487
O2 Saturation: 99.2 %
Patient temperature: 36.4
pCO2 arterial: 97 mm[Hg] (ref 32–48)
pH, Arterial: 7.21 — ABNORMAL LOW (ref 7.35–7.45)
pO2, Arterial: 184 mm[Hg] — ABNORMAL HIGH (ref 83–108)

## 2023-09-01 LAB — POCT I-STAT 7, (LYTES, BLD GAS, ICA,H+H)
Acid-Base Excess: 16 mmol/L — ABNORMAL HIGH (ref 0.0–2.0)
Bicarbonate: 44.2 mmol/L — ABNORMAL HIGH (ref 20.0–28.0)
Calcium, Ion: 1.23 mmol/L (ref 1.15–1.40)
HCT: 25 % — ABNORMAL LOW (ref 39.0–52.0)
Hemoglobin: 8.5 g/dL — ABNORMAL LOW (ref 13.0–17.0)
O2 Saturation: 95 %
Patient temperature: 97
Potassium: 4.6 mmol/L (ref 3.5–5.1)
Sodium: 138 mmol/L (ref 135–145)
TCO2: 47 mmol/L — ABNORMAL HIGH (ref 22–32)
pCO2 arterial: 74.3 mm[Hg] (ref 32–48)
pH, Arterial: 7.378 (ref 7.35–7.45)
pO2, Arterial: 80 mm[Hg] — ABNORMAL LOW (ref 83–108)

## 2023-09-01 LAB — BLOOD GAS, VENOUS
Acid-Base Excess: 10.7 mmol/L — ABNORMAL HIGH (ref 0.0–2.0)
Bicarbonate: 43.2 mmol/L — ABNORMAL HIGH (ref 20.0–28.0)
O2 Saturation: 89.4 %
Patient temperature: 35.6
pCO2, Ven: 102 mm[Hg] (ref 44–60)
pH, Ven: 7.23 — ABNORMAL LOW (ref 7.25–7.43)
pO2, Ven: 44 mm[Hg] (ref 32–45)

## 2023-09-01 LAB — COMPREHENSIVE METABOLIC PANEL
ALT: 14 U/L (ref 0–44)
AST: 16 U/L (ref 15–41)
Albumin: 1.8 g/dL — ABNORMAL LOW (ref 3.5–5.0)
Alkaline Phosphatase: 47 U/L (ref 38–126)
Anion gap: 6 (ref 5–15)
BUN: 52 mg/dL — ABNORMAL HIGH (ref 8–23)
CO2: 36 mmol/L — ABNORMAL HIGH (ref 22–32)
Calcium: 8.7 mg/dL — ABNORMAL LOW (ref 8.9–10.3)
Chloride: 94 mmol/L — ABNORMAL LOW (ref 98–111)
Creatinine, Ser: 1.07 mg/dL (ref 0.61–1.24)
GFR, Estimated: >60 mL/min (ref >60)
Glucose, Bld: 140 mg/dL — ABNORMAL HIGH (ref 70–99)
Potassium: 5 mmol/L (ref 3.5–5.1)
Sodium: 136 mmol/L (ref 135–145)
Total Bilirubin: 0.6 mg/dL (ref 0.0–1.2)
Total Protein: 5.6 g/dL — ABNORMAL LOW (ref 6.5–8.1)

## 2023-09-01 LAB — CULTURE, RESPIRATORY W GRAM STAIN

## 2023-09-01 LAB — GLUCOSE, CAPILLARY
Glucose-Capillary: 115 mg/dL — ABNORMAL HIGH (ref 70–99)
Glucose-Capillary: 118 mg/dL — ABNORMAL HIGH (ref 70–99)
Glucose-Capillary: 128 mg/dL — ABNORMAL HIGH (ref 70–99)
Glucose-Capillary: 131 mg/dL — ABNORMAL HIGH (ref 70–99)
Glucose-Capillary: 138 mg/dL — ABNORMAL HIGH (ref 70–99)
Glucose-Capillary: 147 mg/dL — ABNORMAL HIGH (ref 70–99)

## 2023-09-01 LAB — CBC
HCT: 27.1 % — ABNORMAL LOW (ref 39.0–52.0)
Hemoglobin: 8.3 g/dL — ABNORMAL LOW (ref 13.0–17.0)
MCH: 30.1 pg (ref 26.0–34.0)
MCHC: 30.6 g/dL (ref 30.0–36.0)
MCV: 98.2 fL (ref 80.0–100.0)
Platelets: 131 10*3/uL — ABNORMAL LOW (ref 150–400)
RBC: 2.76 MIL/uL — ABNORMAL LOW (ref 4.22–5.81)
RDW: 15.9 % — ABNORMAL HIGH (ref 11.5–15.5)
WBC: 11.7 10*3/uL — ABNORMAL HIGH (ref 4.0–10.5)
nRBC: 0 % (ref 0.0–0.2)

## 2023-09-01 LAB — ACID FAST SMEAR (AFB, MYCOBACTERIA): Acid Fast Smear: NEGATIVE

## 2023-09-01 LAB — TRIGLYCERIDES: Triglycerides: 47 mg/dL (ref <150)

## 2023-09-01 LAB — LACTIC ACID, PLASMA: Lactic Acid, Venous: 0.8 mmol/L (ref 0.5–1.9)

## 2023-09-01 MED ORDER — FUROSEMIDE 10 MG/ML IJ SOLN
40.0000 mg | Freq: Two times a day (BID) | INTRAMUSCULAR | Status: AC
  Administered 2023-09-01 (×2): 40 mg via INTRAVENOUS
  Filled 2023-09-01 (×2): qty 4

## 2023-09-01 MED ORDER — LIDOCAINE HCL (PF) 2% IJ FOR NEBU
5.0000 mL | Freq: Once | RESPIRATORY_TRACT | Status: AC
  Administered 2023-09-01: 5 mL via RESPIRATORY_TRACT
  Filled 2023-09-01: qty 5

## 2023-09-01 NOTE — Progress Notes (Addendum)
 eLink Physician-Brief Progress Note Patient Name: Joshua Eaton DOB: October 11, 1955 MRN: [id number]   Date of Service  09/01/2023  HPI/Events of Note  Transfer request to [facility] was initiated earlier in the day.  No beds were available earlier but now there is a potential bed available.  eICU Interventions  Spoke with transfer center at [facility] @2154  to affirm bed request and give basic medical history.  Awaiting callback for doc to doc   2307 -spoke with Dr. Tobias Bucker at [facility] virtual ICU.  She is going to discuss the case with her local intensivist to determine whether this would be an appropriate case-bed availability is quite limited and he may not be best suited at the 1 available bed within their system.  Awaiting callback.  2348 -transfer center is unable to transfer at this time-beds have become limited again.  No transfer tonight.  0355 -updated propofol  titration to be by 5 mcg instead of 10  Intervention Category Intermediate Interventions: Communication with other healthcare providers and/or family  Ria Hemming 09/01/2023, 9:53 PM

## 2023-09-01 NOTE — TOC Progression Note (Signed)
 Transition of Care [facility]) - Progression Note    Patient Details  Name: Joshua Eaton MRN: [id number] Date of Birth: 02-Oct-1955  [facility] CM/SW Contact  Andrez JULIANNA George, RN Phone Number: 09/01/2023, 4:23 PM  Clinical Narrative:     S/p bronch with replacement of trach today. Continues on Vent TOC following.        Expected Discharge Plan and Services                                               Social Determinants of Health (SDOH) Interventions SDOH Screenings   Food Insecurity: No Food Insecurity (08/21/2023)  Housing: Unknown (08/21/2023)  Transportation Needs: No Transportation Needs (08/21/2023)  Utilities: Not At Risk (08/21/2023)  Financial Resource Strain: Patient Unable To Answer (07/14/2023)   Received from [facility]  Social Connections: Patient Unable To Answer (07/14/2023)   Received from [facility]  Stress: Patient Unable To Answer (07/14/2023)   Received from [facility]  Tobacco Use: Low Risk  (08/31/2023)   Received from [facility]    Readmission Risk Interventions    08/16/2023    3:55 PM  Readmission Risk Prevention Plan  Transportation Screening Complete  Medication Review (RN Care Manager) Referral to Pharmacy  PCP or Specialist appointment within 3-5 days of discharge Complete  HRI or Home Care Consult Complete  SW Recovery Care/Counseling Consult Complete  Skilled Nursing Facility Complete

## 2023-09-01 NOTE — Progress Notes (Addendum)
 PCCM note  [facility], [facility] and [facility] for transfer as per family request  At [facility] I spoke to Dr. Estela Puma.  There are no beds available at [facility] and was informed that he is getting all the care needed here and there is nothing more that can be offered at [facility].  At [facility] I spoke with Dr. Margretta Ransom and got a similar message that there are no beds available at [facility] and that [facility] would not be able to offer any other interventions and hence they are declining the transfer.  At [facility] I was not able to speak with a doctor but there are no beds available and he is on the wait list. When a bed opens up we will get called to speak with a doctor there and then determine if they would accept him for a transfer  Kash Mothershead MD  Pulmonary & Critical care See Amion for pager  If no response to pager , please call [phone] until 7pm After 7:00 pm call Elink  [phone] 09/01/2023, 4:57 PM

## 2023-09-01 NOTE — Progress Notes (Addendum)
 NAME:  Joshua Eaton, MRN:  [id number], DOB:  Oct 08, 1955, LOS: 18 ADMISSION DATE:  08/13/2023, CONSULTATION DATE:  12/13 REFERRING MD:  Dr. Yolande, CHIEF COMPLAINT:  GIB   History of Present Illness:  Patient is a 68 yo M w/ pertinent PMH chronic respiratory failure w/ tracheostomy/[facility], Afib on eliquis  presents to [facility] ED on 12/13 w/ GIB.  Patient recently admitted w/ respiratory failure and pna in October 2024 requiring tracheostomy. [facility]. Later that day had an accidental trach dislodgement. His wob continued to increase requiring re-admission to [facility] on 10/31 and was intubated and later trached during hospital admission. [facility]   On 12/13 patient admitted to [facility] w/ drop in hgb and concern for GIB. GI consulted for possible endoscopy. TRH to admit and PCCM to consult for vent management.  Pertinent  Medical History   Past Medical History:  Diagnosis Date   Anxiety    Cancer (HCC)    Depression    Hypertension    OCD (obsessive compulsive disorder)    Significant Hospital Events: Including procedures, antibiotic start and stop dates in addition to other pertinent events   12/13 admitted w/ GIB. Pccm to consult for vent management 12/14 1 unit pRBC 12/15 EGD - erosion around PEG site but no active bleeding; cratered ulcer biopsied near peg site.  12/16 plugging with clots- posterior wall bleeding from distal end of trach 12/23 family fired pccm 12/24 pccm asked to re-engage by ENT via RN staff .  12/29 # 7 adjustable length cuffed biovona placed set at 110 mm wife updated  12/30 Bedside bronchoscopy with BAL of RML 12/31 Worsening status with difficulty to ventilate, sedated, paralyzed, trach repositioned.  Started meropenem  for Serratia HAP  Interim History / Subjective:   Increased vent dyssynchrony, worsening ABG overnight BAL cultures are now growing  Serratia.  Started meropenem  Underwent repeat bronchoscopy and repositioning of the trach overnight Now on Levophed , bicarb drip and continuous paralytic. Back on ICU status with PCCM team as primary due to increasing vent requirements, need for pressors and paralytics.  Objective   Blood pressure (!) 123/55, pulse 82, temperature (!) 97.5 F (36.4 C), temperature source Axillary, resp. rate (!) 24, height 6' 0.99 (1.854 m), weight 97.6 kg, SpO2 91%.    Vent Mode: AC FiO2 (%):  [40 %-100 %] 60 % Set Rate:  [24 bmp-26 bmp] 24 bmp Vt Set:  [400 mL-540 mL] 400 mL PEEP:  [5 cmH20-8 cmH20] 5 cmH20 Pressure Support:  [12 cmH20] 12 cmH20 Plateau Pressure:  [9 cmH20-41 cmH20] 9 cmH20   Intake/Output Summary (Last 24 hours) at 09/01/2023 0745 Last data filed at 09/01/2023 0300 Gross per 24 hour  Intake 3385.52 ml  Output 740 ml  Net 2645.52 ml   Filed Weights   08/29/23 0322 08/30/23 0500 08/31/23 0435  Weight: 95.1 kg 97.1 kg 97.6 kg    Examination: Gen:      No acute distress, chronically ill-appearing HEENT:  EOMI, sclera anicteric Neck:     No masses; no thyromegaly, trach Lungs:    Clear to auscultation bilaterally; normal respiratory effort CV:         Regular rate and rhythm; no murmurs Abd:      + bowel sounds; soft, non-tender; no palpable masses, no distension Ext:    No edema; adequate peripheral perfusion Skin:      Warm and dry; no rash  Neuro:  Sedated, paralyzed, unresponsive  Lab/imaging reviewed  X-ray with persistent bilateral airspace disease Lactic acid remains normal at 0.8 WBC 11.7, hemoglobin 8.3, platelets 131  CTA yesterday with no large PE.  Dense consolidative changes in the lower lobes, right middle lobe, diffuse groundglass attenuation, small bilateral effusion.  Resolved Hospital Problem list   Hypokalemia HTN AKI Hemoptysis (resolved) secondary to sxn trauma   Assessment & Plan:  Acute on chronic respiratory failure Serratia HAP Has had  ongoing issues with trach and now decompensated with Serratia HAP, ARDS Started meropenem  on 12/21.  He has already completed a course of linezolid  and does not need gram-positive coverage at this point based on BAL cultures Continue low tidal volume ventilation.  Monitor peaks and plateaus Repeat ABG and determine if he still needs bicarb drip  Trach/vent dependence Tracheomalacia  Trach issues with positioning and cuff leak Evaluation of airway demonstrated that there is no longer any ulceration or injury of the airway but the tip  #6 xlt was angled towards the posterior wall of the trach resulting in increased risk for sxn trauma as well as just potentially injury from trach itself. Also noted that he has fairly sig tracheomalacia. Now has adjustable # 7 biovona cuffed w/ length set at 11.75mm.  Still has some issues with cuff leak but options for management are limited.  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  Check CVP and SCV O2  ABLA 2/2 UGIB/ duodenitis, ongoing slow trend down in Hb Elevated d-dimer despite treatment for DVT with [facility] for the past few months No evidence of PE on CT angiogram yesterday Holding full anticoagulation due to GI bleed  LUE provoked DVT from picc line Holding anticoagulation as noted above  Acute kidney injury Monitor urine output and creatinine  Unstagable pressure ulcer on sacrum POA Wound care  Moderate protein energy malnutrition  Continue tube feeds  Hyperbilirubinemia; Anxiety  Rash on knee-- looks like ringworm Petechial rash on hands and feet; blood cultures and serologies negative so far Thrombocytopenia- unclear etiology.  Mild hyperglycemia Severe deconditioning  Diarrhea  Best Practice (right click and Reselect all  SmartList Selections daily)   Diet/type: tubefeeds DVT prophylaxis LMWH Pressure ulcer(s): present on admission  GI prophylaxis: PPI Lines: Central line Foley:  Yes, and it is still needed Code Status:  full code Last date of multidisciplinary goals of care discussion [] .  Wife updated in detail at bedside 12/31 and 1/1  Critical care time:   The patient is critically ill with multiple organ system failure and requires high complexity decision making for assessment and support, frequent evaluation and titration of therapies, advanced monitoring, review of radiographic studies and interpretation of complex data.   Critical Care Time devoted to patient care services, exclusive of separately billable procedures, described in this note is 35 minutes.   Leanor Voris MD [facility] See Amion for pager  If no response to pager , please call [phone] until 7pm After 7:00 pm call Elink  [phone] 09/01/2023, 7:45 AM

## 2023-09-01 NOTE — Progress Notes (Signed)
 Trach changed by NP Anders Simmonds with MD Chilton Greathouse assisting with bronch. RN and RT at bedside. Patient now has #6 XLT proximal cuffed. Vitals stable throughout. Exhaled tidal volumes and cuff leak better. RT will continue to monitor as needed.

## 2023-09-01 NOTE — Progress Notes (Signed)
 PT Cancellation Note  Patient Details Name: Joshua Eaton MRN: [id number] DOB: 06-15-1956   Cancelled Treatment:    Reason Eval/Treat Not Completed: Medical issues which prohibited therapy. Pt remains sedated on the vent, unable to actively participate in PT session. PT will follow up at a later time.   [name] 09/01/2023, 3:21 PM

## 2023-09-01 NOTE — Procedures (Addendum)
 Bronchoscopy Procedure Note  Joshua Eaton  [id number]  May 23, 1956  Date:09/01/23  Time:5:38 AM   Provider Performing:Yuritzy Zehring M Sharbel Sahagun   Procedure(s):  Flexible Bronchoscopy [phone])  Indication(s) Elevated peak inspiratory pressure, air leak through the mouth  Consent Risks of the procedure as well as the alternatives and risks of each were explained to the patient and/or caregiver.  Consent for the procedure was obtained and is signed in the bedside chart  Anesthesia On sedation drips   Time Out Verified patient identification, verified procedure, site/side was marked, verified correct patient position, special equipment/implants available, medications/allergies/relevant history reviewed, required imaging and test results available.   Sterile Technique Usual hand hygiene, masks, gowns, and gloves were used   Procedure Description Bronchoscope advanced through tracheostomy tube and into airway. There is a granulation tissue (at the mark 11 cm) in the post tracheal wall causing 80% airway narrow and blocking the tracheal distal end. Distal airways otherwise have minimal secretions. The tracheostomy was advanced under direct visual (bronchoscopically) to bypass the granulation tissue (at the mark 11.8 cm). Post PCXR; distal end is 1 cm above carina   Findings: as above  Bivona tracheostomy   Complications/Tolerance None; patient tolerated the procedure well. Chest X-ray is needed post procedure.   EBL Minimal   Specimen(s) none

## 2023-09-01 NOTE — Procedures (Signed)
 Bronchoscopy Procedure Note  Joshua Eaton  [id number]  1956-07-20  Date:09/01/23  Time:3:44 PM   Provider Performing:Captola Teschner   Procedure(s):  Flexible Bronchoscopy (68377)  Indication(s) Trach exchange and placement, airway inspection  Consent Risks of the procedure as well as the alternatives and risks of each were explained to the patient and/or caregiver.  Consent for the procedure was obtained and is signed in the bedside chart  Anesthesia Propofol , fentanyl , rocuronium    Time Out Verified patient identification, verified procedure, site/side was marked, verified correct patient position, special equipment/implants available, medications/allergies/relevant history reviewed, required imaging and test results available.   Sterile Technique Usual hand hygiene, masks, gowns, and gloves were used   Procedure Description Bronchoscope advanced through the Bivona tracheostomy which appears to be against the posterior wall but not completely obstructed.  Mild granulation tissue noted in the posterior trachea.  The tracheostomy was removed and replaced by #6 proximal XLT Shiley.  On examination by bronchoscope.  Tracheostomy appears to be.  Placed in the midline of the trachea about 4 cm above the carina.  Next complete airway inspection was performed bilaterally with mild mucus which was suctioned out.  No other abnormalities of bleeding noted.     Complications/Tolerance None; patient tolerated the procedure well. Chest X-ray is not needed post procedure.   EBL Minimal   Specimen(s)   Lonna Coder MD [facility] See Amion for pager  If no response to pager , please call [phone] until 7pm After 7:00 pm call Elink  [phone] 09/01/2023, 3:47 PM

## 2023-09-01 NOTE — Procedures (Signed)
 Tracheostomy Exchange Procedure Note  Joshua Eaton  [id number]  May 11, 1956  Date:09/01/23  Time:2:16 PM   Provider Performing:Pete E Jenna   Procedure: Tracheostomy Exchange Through Immature Stoma (68497)  Indication(s) Cuff leak and posterior wall angulation of trach  Consent Risks of the procedure as well as the alternatives and risks of each were explained to the patient and/or caregiver.  Consent for the procedure was obtained and is signed in the bedside chart  Anesthesia Current prop, fent and NMB additionally pre-nebulized w/ lidocaine    Time Out Verified patient identification, verified procedure, site/side was marked, verified correct patient position, special equipment/implants available, medications/allergies/relevant history reviewed, required imaging and test results available.   Sterile Technique Hand hygiene, gloves   Procedure Description Size 7  cuffed existing biovona was evaluated. The tip at current level was fairly midline however only ~ 1 cm above carina and we were unable to ventilate w/out cuff leak and volume loss. We then pulled the trach back to 95 cm. With the biovona is was slightly angled towards the posterior trachea. This was removed and size 6 Proximal XLT cuffed Shiley placed through stoma over bronchoscopic guidance. This yielded excellent placement midline in stoma and we were able to get full volume returns. There was still a small positional airleak that was resolved by optimizing trach cuff inflation.    Complications/Tolerance None; patient tolerated the procedure well..   EBL Minimal

## 2023-09-01 DEATH — deceased

## 2023-09-02 ENCOUNTER — Inpatient Hospital Stay (HOSPITAL_COMMUNITY): Payer: Medicare Other

## 2023-09-02 DIAGNOSIS — J1569 Pneumonia due to other gram-negative bacteria: Secondary | ICD-10-CM

## 2023-09-02 DIAGNOSIS — R4589 Other symptoms and signs involving emotional state: Secondary | ICD-10-CM

## 2023-09-02 DIAGNOSIS — J9601 Acute respiratory failure with hypoxia: Secondary | ICD-10-CM | POA: Diagnosis not present

## 2023-09-02 LAB — POCT I-STAT 7, (LYTES, BLD GAS, ICA,H+H)
Acid-Base Excess: 25 mmol/L — ABNORMAL HIGH (ref 0.0–2.0)
Acid-Base Excess: 21 mmol/L — ABNORMAL HIGH (ref 0.0–2.0)
Bicarbonate: 50.4 mmol/L — ABNORMAL HIGH (ref 20.0–28.0)
Bicarbonate: 48.5 mmol/L — ABNORMAL HIGH (ref 20.0–28.0)
Calcium, Ion: 1.16 mmol/L (ref 1.15–1.40)
Calcium, Ion: 1.2 mmol/L (ref 1.15–1.40)
HCT: 23 % — ABNORMAL LOW (ref 39.0–52.0)
HCT: 23 % — ABNORMAL LOW (ref 39.0–52.0)
Hemoglobin: 7.8 g/dL — ABNORMAL LOW (ref 13.0–17.0)
Hemoglobin: 7.8 g/dL — ABNORMAL LOW (ref 13.0–17.0)
O2 Saturation: 87 %
O2 Saturation: 97 %
Potassium: 4.5 mmol/L (ref 3.5–5.1)
Potassium: 4.5 mmol/L (ref 3.5–5.1)
Sodium: 135 mmol/L (ref 135–145)
Sodium: 136 mmol/L (ref 135–145)
TCO2: >50 mmol/L — ABNORMAL HIGH (ref 22–32)
TCO2: >50 mmol/L — ABNORMAL HIGH (ref 22–32)
pCO2 arterial: 61.4 mm[Hg] — ABNORMAL HIGH (ref 32–48)
pCO2 arterial: 79.7 mm[Hg] (ref 32–48)
pH, Arterial: 7.522 — ABNORMAL HIGH (ref 7.35–7.45)
pH, Arterial: 7.392 (ref 7.35–7.45)
pO2, Arterial: 49 mm[Hg] — ABNORMAL LOW (ref 83–108)
pO2, Arterial: 98 mm[Hg] (ref 83–108)

## 2023-09-02 LAB — CBC
HCT: 22.7 % — ABNORMAL LOW (ref 39.0–52.0)
Hemoglobin: 7.3 g/dL — ABNORMAL LOW (ref 13.0–17.0)
MCH: 30.5 pg (ref 26.0–34.0)
MCHC: 32.2 g/dL (ref 30.0–36.0)
MCV: 95 fL (ref 80.0–100.0)
Platelets: 113 10*3/uL — ABNORMAL LOW (ref 150–400)
RBC: 2.39 MIL/uL — ABNORMAL LOW (ref 4.22–5.81)
RDW: 16.1 % — ABNORMAL HIGH (ref 11.5–15.5)
WBC: 7.2 10*3/uL (ref 4.0–10.5)
nRBC: 0 % (ref 0.0–0.2)

## 2023-09-02 LAB — GLUCOSE, CAPILLARY
Glucose-Capillary: 102 mg/dL — ABNORMAL HIGH (ref 70–99)
Glucose-Capillary: 102 mg/dL — ABNORMAL HIGH (ref 70–99)
Glucose-Capillary: 113 mg/dL — ABNORMAL HIGH (ref 70–99)
Glucose-Capillary: 124 mg/dL — ABNORMAL HIGH (ref 70–99)
Glucose-Capillary: 126 mg/dL — ABNORMAL HIGH (ref 70–99)
Glucose-Capillary: 130 mg/dL — ABNORMAL HIGH (ref 70–99)

## 2023-09-02 LAB — COMPREHENSIVE METABOLIC PANEL
ALT: 15 U/L (ref 0–44)
AST: 17 U/L (ref 15–41)
Albumin: <1.5 g/dL — ABNORMAL LOW (ref 3.5–5.0)
Alkaline Phosphatase: 49 U/L (ref 38–126)
Anion gap: 8 (ref 5–15)
BUN: 57 mg/dL — ABNORMAL HIGH (ref 8–23)
CO2: 39 mmol/L — ABNORMAL HIGH (ref 22–32)
Calcium: 8.1 mg/dL — ABNORMAL LOW (ref 8.9–10.3)
Chloride: 89 mmol/L — ABNORMAL LOW (ref 98–111)
Creatinine, Ser: 0.99 mg/dL (ref 0.61–1.24)
GFR, Estimated: >60 mL/min (ref >60)
Glucose, Bld: 143 mg/dL — ABNORMAL HIGH (ref 70–99)
Potassium: 4.5 mmol/L (ref 3.5–5.1)
Sodium: 136 mmol/L (ref 135–145)
Total Bilirubin: 0.9 mg/dL (ref 0.0–1.2)
Total Protein: 4.9 g/dL — ABNORMAL LOW (ref 6.5–8.1)

## 2023-09-02 LAB — CYTOLOGY - NON PAP

## 2023-09-02 LAB — SARS CORONAVIRUS 2 (TAT 6-24 HRS): SARS Coronavirus 2: NEGATIVE

## 2023-09-02 LAB — CULTURE, RESPIRATORY W GRAM STAIN: Special Requests: NORMAL

## 2023-09-02 MED ORDER — SODIUM CHLORIDE 0.9 % IV SOLN: 2.0000 g | Freq: Three times a day (TID) | INTRAVENOUS | Status: DC

## 2023-09-02 MED ORDER — ROCURONIUM BROMIDE 10 MG/ML (PF) SYRINGE: 100.0000 mg | PREFILLED_SYRINGE | Freq: Once | INTRAVENOUS | Status: AC

## 2023-09-02 MED ORDER — FUROSEMIDE 10 MG/ML IJ SOLN
40.0000 mg | Freq: Three times a day (TID) | INTRAMUSCULAR | Status: AC
  Administered 2023-09-02 – 2023-09-04 (×6): 40 mg via INTRAVENOUS
  Filled 2023-09-02 (×6): qty 4

## 2023-09-02 MED ORDER — SODIUM CHLORIDE 0.9 % IV SOLN
2.0000 g | Freq: Three times a day (TID) | INTRAVENOUS | Status: DC
  Administered 2023-09-02 – 2023-09-07 (×15): 2 g via INTRAVENOUS
  Filled 2023-09-02 (×15): qty 12.5

## 2023-09-02 MED ORDER — ROCURONIUM BROMIDE 10 MG/ML (PF) SYRINGE
PREFILLED_SYRINGE | INTRAVENOUS | Status: AC
  Administered 2023-09-02: 100 mg via INTRAVENOUS
  Filled 2023-09-02: qty 10

## 2023-09-02 MED ORDER — MAGNESIUM SULFATE 2 GM/50ML IV SOLN
2.0000 g | Freq: Once | INTRAVENOUS | Status: AC
  Administered 2023-09-02: 2 g via INTRAVENOUS
  Filled 2023-09-02: qty 50

## 2023-09-02 MED ORDER — TERBINAFINE HCL 1 % EX CREA
TOPICAL_CREAM | Freq: Two times a day (BID) | CUTANEOUS | Status: DC
  Administered 2023-09-03 – 2023-09-07 (×6): 1 via TOPICAL
  Filled 2023-09-02 (×4): qty 12

## 2023-09-02 MED ORDER — ETOMIDATE 2 MG/ML IV SOLN
INTRAVENOUS | Status: AC
  Administered 2023-09-02: 20 mg via INTRAVENOUS
  Filled 2023-09-02: qty 20

## 2023-09-02 MED ORDER — ETOMIDATE 2 MG/ML IV SOLN: 20.0000 mg | Freq: Once | INTRAVENOUS | Status: AC

## 2023-09-02 MED ORDER — TOLNAFTATE 1 % EX CREA: TOPICAL_CREAM | Freq: Two times a day (BID) | CUTANEOUS | Status: DC

## 2023-09-02 MED ORDER — POTASSIUM CHLORIDE 20 MEQ PO PACK
40.0000 meq | PACK | Freq: Once | ORAL | Status: AC
  Administered 2023-09-02: 40 meq
  Filled 2023-09-02: qty 2

## 2023-09-02 NOTE — Progress Notes (Addendum)
 NAME:  Joshua Eaton, MRN:  [id number], DOB:  02/25/1956, LOS: 19 ADMISSION DATE:  08/13/2023, CONSULTATION DATE:  12/13 REFERRING MD:  Dr. Yolande, CHIEF COMPLAINT:  GIB   History of Present Illness:  Patient is a 68 yo M w/ pertinent PMH chronic respiratory failure w/ [facility], Afib on eliquis  presents to [facility] ED on 12/13 w/ GIB.  Patient recently admitted w/ respiratory failure and pna in October 2024 requiring tracheostomy. [facility] was [facility]. Later that day had an accidental trach dislodgement. His wob continued to increase requiring re-admission to [facility] on 10/31 and was intubated and later trached during hospital admission. [facility]   On 12/13 patient admitted to [facility] w/ drop in hgb and concern for GIB. GI consulted for possible endoscopy. TRH to admit and PCCM to consult for vent management.  Pertinent  Medical History   Past Medical History:  Diagnosis Date   Anxiety    Cancer (HCC)    Depression    Hypertension    OCD (obsessive compulsive disorder)    Significant Hospital Events: Including procedures, antibiotic start and stop dates in addition to other pertinent events   12/13 admitted w/ GIB. Pccm to consult for vent management 12/14 1 unit pRBC 12/15 EGD - erosion around PEG site but no active bleeding; cratered ulcer biopsied near peg site.  12/16 plugging with clots- posterior wall bleeding from distal end of trach. Hemoptysis secondary to tracheostomy malpositioning.  This is resolved. -Patient had originally been on IV heparin  for DVT.  Had tracheostomy in place, and developed fairly significant hemoptysis back on 12/16.  Bronchoscopy identified the distal tip of the ostomy abutting the posterior wall of the trachea with small amount of ulceration.  He received TXA, heavy sedation, anticoagulation was held. 12/23 family fired pccm 12/24 pccm asked to  re-engage by ENT via RN staff .  12/29 # 7 adjustable length cuffed biovona placed set at 110 mm wife updated  12/30 Bedside bronchoscopy with BAL of RML 12/31 Worsening status with difficulty to ventilate, sedated, paralyzed, trach repositioned.  Started meropenem  for Serratia HAP 1/1 changed trach to 6 prox xlt. Excellent mid-line positioning. Improved gas exchange 1/2 working on weaning  Interim History / Subjective:  Looking better  Objective   Blood pressure (!) 111/46, pulse 70, temperature 98.2 F (36.8 C), temperature source Axillary, resp. rate (!) 30, height 6' 0.99 (1.854 m), weight 104.8 kg, SpO2 93%. CVP:  [16 mmHg] 16 mmHg  Vent Mode: AC FiO2 (%):  [40 %-100 %] 40 % Set Rate:  [26 bmp-30 bmp] 26 bmp Vt Set:  [400 mL-480 mL] 480 mL PEEP:  [5 cmH20] 5 cmH20 Plateau Pressure:  [23 cmH20-25 cmH20] 25 cmH20   Intake/Output Summary (Last 24 hours) at 09/02/2023 9187 Last data filed at 09/02/2023 0500 Gross per 24 hour  Intake 5535.14 ml  Output 2264 ml  Net 3271.14 ml   Filed Weights   08/30/23 0500 08/31/23 0435 09/02/23 0500  Weight: 97.1 kg 97.6 kg 104.8 kg    Examination: General chronically ill appearing 68 year old male sedated and paralyzed on vent HENT NCAT 6 prox xlt unremarkable. Mid line no sig secretions Pulm scattered rhonchi Portable chest x-ray personally reviewed: Tracheostomy midline central line in satisfactory position right greater than left airspace disease with slight improvement in aeration cannot exclude right pleural effusion Card rrr Abd soft Ext warm diffuse anasarca. + pulses  left knee ringworm still present Neuro sedated   Resolved Hospital Problem list   Hypokalemia HTN AKI Hemoptysis (resolved) secondary to sxn trauma  Hyperbilirubinemia Petechial rash resolved Rash on knee-- treated for  ringworm w/ antifungal X 2 weeks  Assessment & Plan:  Acute on chronic respiratory failure Serratia HAP w/ ARDS  -Slight improved aeration,  white blood cell count improving Plan Continuing protective lung ventilation, FiO2 now down to 40% so we can keep PEEP at 5, follow-up ABG pending Day #3 meropenem , based on sensitivities change to cefepime  and rx x 10d Continue IV Lasix  as BUN and creatinine tolerate, he is 17 L positive throughout his hospital stay Will discontinue neuromuscular blockade today Change RASS goal to -2 A.m. chest x-ray  Trach/vent dependence: Has been on mechanical ventilation since October 2024.  Given his degree of deconditioning, mitral valve disease, and also tracheomalacia still not clear if he can be liberated from mechanical ventilation Plan Continuing routine tracheostomy care Ventilation strategy as mentioned above Last trach change was 1/1, will plan on monthly changes for now  Tracheostomy malpositioning.  This is now resolved.  Difficulty finding optimal tracheostomy.  Came with XLT distal which was abutting the posterior tracheal wall resulting in erosion and hemoptysis.  This resolved, attempted biovona adjustable trach, still could not achieve optimal positioning with distal tip at posterior wall.  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  Family requesting transfer to tertiary facility Plan Continuing telemetry We have reached out to [facility] and [facility] on 1/1 he was denied transfer Reached out to [facility], no beds  Paroxysmal Afib Plan Rate control Telemetry monitoring Ensure magnesium  greater than 2 and potassium greater  than 4  ABLA 2/2 UGIB/ duodenitis, now more anemia of critical illness No obvious active bleeding now, hemoglobin is down to 7.3 I suspect there is an element of hemodilution today He has been off anticoagulation since 12/15 Plan Continue to hold anticoagulation Continue every 12 hour PPI Trigger for transfusion is hemoglobin less than 7 or active bleeding with hemodynamic instability  LUE provoked DVT from picc line Plan PICC line was removed back on 12/14 from the left upper extremity He remains off systemic anticoagulation given bleeding problems Would avoid PICC placement in the left Seems to be tolerating low molecular weight heparin  will continue Repeating US    Acute metabolic encephalopathy superimposed on history of anxiety Plan PAD protocol changing RASS goal to -2 Treating infection As he weans off sedation will need to evaluate sleep-wake cycle  Difficult IV access Has had right IJ triple-lumen catheter since 12/14.  Blood cultures of all been negative Plan Continue current therapy Will need to consider right upper extremity PICC versus central line and replacement, site is clean so no urgency here  Total body water  overload with recent AKI, now resolved 17 L positive on 1/2 Plan Continuing diuresis Daily observation of chemistry to avoid recurrent renal failure Strict intake output Renal dose meds  Unstagable pressure ulcer on sacrum POA Plan Continue wound care  Moderate protein calorie malnutrition  Plan  continue tube feeds  Thrombocytopenia in the setting of acute illness.  Suspect driven by sepsis at this point, currently stable Plan Continue to monitor  Ring worm left knee Had two weeks of rx. Better but not resolved Plan Tinactin cream started for second round 1/2 If no improvement over next 7 d consider oral rx VT   Mild hyperglycemia Plan Sliding scale insulin  goal glucose 140-180  Severe deconditioning  Plan Is going to need  extensive rehabilitation, and ongoing focus on nutritional support.  Almost certainly will need LTAC setting again in the future  Diarrhea Plan Optimizing diet Fiber added to tube feeds Has fecal incontinence system in place  Discharge planning -Difficult situation.  Ultimately needs surgery for his valvular disease however his degree of deconditioning, malnutrition, and ventilator dependence continue to be a barrier.  Not a surgical candidate back in October and certainly not now.  Would need extensive rehabilitation as well as liberation from mechanical ventilation for this to be an option.  We have contacted 3 different tertiary academic facilities.  We have been turned down by [facility] as well as [facility].  There are no current beds at [facility].  Family continues to request transfer. Plan Continuing supportive care Will reach out to [facility] again within the next 24 to 48 hours If he continues to improve he may be LTAC appropriate again  [employer] (right click and Reselect all SmartList Selections daily)   Diet/type: tubefeeds DVT prophylaxis LMWH Pressure ulcer(s): present on admission  GI prophylaxis: PPI Lines: Central line Foley:  Yes, and it is still needed Code Status:  full code Last date of multidisciplinary goals of care discussion [] .  Wife updated in detail at bedside 12/31 and 1/1  Critical care time: 80 minutes     09/02/2023, 8:12 AM

## 2023-09-02 NOTE — Progress Notes (Signed)
 OT Cancellation Note  Patient Details Name: Joshua Eaton MRN: [id number] DOB: [birth year]   Cancelled Treatment:    Reason Eval/Treat Not Completed: Patient not medically ready;Medical issues which prohibited therapy. Pt remains sedated and unable to participate in OT. Will sign off an await new orders.   Kennth Mliss Helling 09/02/2023, 10:43 AM Mliss HERO, OTR/L Acute Rehabilitation Services Office: [phone]

## 2023-09-02 NOTE — Progress Notes (Signed)
 Arterial blood gas reviewed.  Will except current minute ventilation, suspect this he begins to wake up more he will set his own minute ventilation some so keep respiratory rate at 22.  Progressive hypoxia will increase PEEP to 5

## 2023-09-02 NOTE — Progress Notes (Addendum)
 NAME:  Joshua Eaton, MRN:  [id number], DOB:  August 24, 1956, LOS: 19 ADMISSION DATE:  08/13/2023, CONSULTATION DATE:  12/13 REFERRING MD:  Dr. Yolande, CHIEF COMPLAINT:  GIB   History of Present Illness:  Patient is a 68 yo M w/ pertinent PMH chronic respiratory failure w/ tracheostomy/[facility], Afib on eliquis  presents to [facility] ED on 12/13 w/ GIB.  Patient recently admitted w/ respiratory failure and pna in October 2024 requiring tracheostomy. He was later transferred to [facility] on 10/[facility]. Later that day had an accidental trach dislodgement. His wob continued to increase requiring re-admission to [facility] on 10/31 and was intubated and later trached during hospital admission. Patient discharged [facility].   On 12/13 patient admitted to [facility] w/ drop in hgb and concern for GIB. GI consulted for possible endoscopy. TRH to admit and PCCM to consult for vent management.  Pertinent  Medical History   Past Medical History:  Diagnosis Date   Anxiety    Cancer (HCC)    Depression    Hypertension    OCD (obsessive compulsive disorder)    Significant Hospital Events: Including procedures, antibiotic start and stop dates in addition to other pertinent events   12/13 admitted w/ GIB. Pccm to consult for vent management 12/14 1 unit pRBC 12/15 EGD - erosion around PEG site but no active bleeding; cratered ulcer biopsied near peg site.  12/16 plugging with clots- posterior wall bleeding from distal end of trach. Hemoptysis secondary to tracheostomy malpositioning.  This is resolved. -Patient had originally been on IV heparin  for DVT.  Had tracheostomy in place, and developed fairly significant hemoptysis back on 12/16.  Bronchoscopy identified the distal tip of the ostomy abutting the posterior wall of the trachea with small amount of ulceration.  He received TXA, heavy sedation, anticoagulation was held. 12/23 family fired pccm 12/24 pccm asked to  re-engage by ENT via RN staff .  12/29 # 7 adjustable length cuffed biovona placed set at 110 mm wife updated  12/30 Bedside bronchoscopy with BAL of RML 12/31 Worsening status with difficulty to ventilate, sedated, paralyzed, trach repositioned.  Started meropenem  for Serratia HAP 1/1 follow up for  biovona adjustable trach, still could not achieve optimal positioning with distal tip at posterior wall.  Ultimately required #6 proximal XLT Brovona changed trach to 6 prox xlt. Excellent mid-line positioning. Improved gas exchange.  1/2 was doing well. NMB discontinued. CXR was looking better. We were escalating diuretics. Had sudden desaturation, Nursing reports large plugs removed but remained hypoxic. RT was NOT able to pass suction cath when they arrived to assist. Also not able to provide manual BMV.  This progressed to bradycardic and then PEA cardiac arrest. Had 1 round ~ 3 minutes CPR. During this time Trach removed (as could not pass) intubated w/ # 7.5 ETT from above.   Interim History / Subjective:  Sedated. Weaning FIO2 again   Objective   Blood pressure (!) 126/51, pulse 89, temperature 97.8 F (36.6 C), temperature source Axillary, resp. rate (!) 22, height 6' 0.99 (1.854 m), weight 104.8 kg, SpO2 97%.    Vent Mode: PRVC FiO2 (%):  [40 %-100 %] 100 % Set Rate:  [22 bmp-30 bmp] 22 bmp Vt Set:  [400 mL-480 mL] 480 mL PEEP:  [5 cmH20-10 cmH20] 10 cmH20 Plateau Pressure:  [23 cmH20-26 cmH20] 26 cmH20   Intake/Output Summary (Last 24 hours) at 09/02/2023 1307 Last data filed at 09/02/2023 1100 Gross per 24  hour  Intake 5061.01 ml  Output 1758 ml  Net 3303.01 ml   Filed Weights   08/30/23 0500 08/31/23 0435 09/02/23 0500  Weight: 97.1 kg 97.6 kg 104.8 kg    Examination: General sedated on full vent support Now orally intubated Pulm scattered rhonchi  Portable chest x-ray personally reviewed: ETT at level of carina.  right greater than left airspace disease without sig  improvement aeration cannot exclude right pleural effusion Card rrr Abd soft still having liq stools Gu cl yellow Neuro sedated  Resolved Hospital Problem list   Hypokalemia HTN AKI Hemoptysis (resolved) secondary to sxn trauma  Hyperbilirubinemia Petechial rash resolved Rash on knee-- treated for  ringworm w/ antifungal X 2 weeks  Assessment & Plan:  Acute on chronic respiratory failure Serratia HAP w/ ARDS  -Slight improved aeration, white blood cell count improving Plan Continuing protective lung ventilation, wean FIO2 Day #3 abx, based on sensitivities changed to cefepime  and rx x 10d Continue IV Lasix  as BUN and creatinine tolerate RASS goal to -2 A.m. chest x-ray F/u post vent change abg this afternoon  vent dependence: Has been on mechanical ventilation since October 2024.  Given his degree of deconditioning, mitral valve disease, and also tracheomalacia still not clear if he can be liberated from mechanical ventilation Trach out due to obstruction and inability to ventilate. 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  Family requesting transfer to tertiary facility Plan Continuing telemetry We have reached out to [facility] and [facility] on 1/1 he was denied transfer Reached out to [facility], no beds  Post cardiac arrest shock. 1/2  Cardiac arrest precipitated by airway obstruction.  Favor mucous plugging but hard to know now that trach is out. Think some of hypotension is residual medication Weaning NE gtt currently Plan Cont to wean NE gtt wean off See discussion re: trach above to minimize risk of airway obstruction in future  Ok to cont lasix   MAP goal > 65  Paroxysmal Afib Plan Rate control Cont amio via tube Telemetry monitoring Ensure magnesium  greater than 2 and potassium greater than 4  ABLA 2/2 UGIB/ duodenitis, now more anemia of critical illness No obvious active bleeding now, hemoglobin is down to 7.3 I suspect there is an element of hemodilution today He has been off anticoagulation since 12/15 Plan Continue to hold anticoagulation Continue every 12 hour PPI Trigger for transfusion is hemoglobin less than 7 or active bleeding with hemodynamic instability  LUE provoked DVT from picc line Plan PICC line was removed back on 12/14 from the left upper extremity He remains off systemic anticoagulation given bleeding problems Would avoid PICC placement in the left, has no stick bracelet Seems to be tolerating low molecular weight heparin  will continue Repeating US    Acute metabolic encephalopathy superimposed on history of anxiety Plan PAD protocol changing RASS goal to -2 Treating  infection  Difficult IV access Has had right IJ triple-lumen catheter since 12/14.  Blood cultures of all been negative Plan Continue current therapy Will need to consider right upper extremity PICC versus central line and replacement, site is clean so no urgency here  Total body water  overload with recent AKI, now resolved 17 L positive on 1/2 Plan Continuing diuresis, Think the hypotension more sedation related  Daily observation of chemistry to avoid recurrent renal failure Strict intake output Renal dose meds  Unstagable pressure ulcer on sacrum POA Plan Continue wound care  Moderate protein calorie malnutrition  Plan  continue tube  feeds  Thrombocytopenia in the setting of acute illness.  Suspect driven by sepsis at this point, currently stable Plan Continue to monitor  Ring worm left knee Had two weeks of rx. Better but not resolved Plan Lamicil cream started for second round 1/2 If no improvement over next 7 d consider oral rx VT   Mild hyperglycemia Plan Sliding scale insulin  goal glucose 140-180  Severe deconditioning  Plan Is going to need extensive rehabilitation, and ongoing focus on nutritional support.  Almost certainly will need LTAC setting again in the future  Diarrhea Plan Optimizing diet Fiber added to tube feeds Has fecal incontinence system in place  Discharge planning -Difficult situation.  Ultimately needs surgery for his valvular disease however his degree of deconditioning, malnutrition, and ventilator dependence continue to be a barrier.  Not a surgical candidate back in October and certainly not now.  Would need extensive rehabilitation as well as liberation from mechanical ventilation for this to be an option.  We have contacted 3 different tertiary academic facilities.  We have been turned down by [facility] as well as [facility].  There are no current beds at [facility].  Family continues to request transfer. Plan Continuing supportive care Will reach out to [facility] again within the next 24 to 48 hours If he continues to improve he may be LTAC appropriate again  [employer] (right click and Reselect all SmartList Selections daily)   Diet/type: tubefeeds DVT prophylaxis LMWH Pressure ulcer(s): present on admission  GI prophylaxis: PPI Lines: Central line Foley:  Yes, and it is still needed Code Status:  full code Last date of multidisciplinary goals of care discussion [] .  Wife updated in detail at bedside 12/31 and 1/1  Critical care time: 32 min     09/02/2023, 1:07 PM     .

## 2023-09-02 NOTE — Progress Notes (Signed)
 PT Cancellation Note  Patient Details Name: Joshua Eaton MRN: [id number] DOB: 02-15-1956   Cancelled Treatment:    Reason Eval/Treat Not Completed: Patient's level of consciousness;Patient not medically ready  Remains sedated, unable to participate with PT efforts.   Please re-order physical therapy once appropriate and patient is able to engage with acute rehab services. We will promptly revisit [name] to re-evaluate and progress his function as tolerated.   Thank you.  Leontine Roads, PT, DPT [facility]  Rehabilitation Services Physical Therapist Office: [phone] Website: [email or web address]  Leontine GORMAN Roads 09/02/2023, 9:37 AM

## 2023-09-02 NOTE — Plan of Care (Signed)
  Problem: Activity: Goal: Ability to tolerate increased activity will improve Outcome: Not Progressing   Problem: Respiratory: Goal: Ability to maintain a clear airway and adequate ventilation will improve Outcome: Not Progressing   Problem: Role Relationship: Goal: Method of communication will improve Outcome: Not Progressing   Problem: Education: Goal: Knowledge of General Education information will improve Description: Including pain rating scale, medication(s)/side effects and non-pharmacologic comfort measures Outcome: Not Progressing   Problem: Health Behavior/Discharge Planning: Goal: Ability to manage health-related needs will improve Outcome: Not Progressing   Problem: Clinical Measurements: Goal: Ability to maintain clinical measurements within normal limits will improve Outcome: Not Progressing Goal: Will remain free from infection Outcome: Not Progressing Goal: Diagnostic test results will improve Outcome: Not Progressing Goal: Respiratory complications will improve Outcome: Not Progressing Goal: Cardiovascular complication will be avoided Outcome: Not Progressing   Problem: Activity: Goal: Risk for activity intolerance will decrease Outcome: Not Progressing   Problem: Nutrition: Goal: Adequate nutrition will be maintained Outcome: Not Progressing   Problem: Coping: Goal: Level of anxiety will decrease Outcome: Not Progressing   Problem: Elimination: Goal: Will not experience complications related to bowel motility Outcome: Not Progressing Goal: Will not experience complications related to urinary retention Outcome: Not Progressing   Problem: Pain Management: Goal: General experience of comfort will improve Outcome: Not Progressing   Problem: Safety: Goal: Ability to remain free from injury will improve Outcome: Not Progressing   Problem: Skin Integrity: Goal: Risk for impaired skin integrity will decrease Outcome: Not Progressing   Problem:  Education: Goal: Ability to describe self-care measures that may prevent or decrease complications (Diabetes Survival Skills Education) will improve Outcome: Not Progressing Goal: Individualized Educational Video(s) Outcome: Not Progressing   Problem: Coping: Goal: Ability to adjust to condition or change in health will improve Outcome: Not Progressing   Problem: Fluid Volume: Goal: Ability to maintain a balanced intake and output will improve Outcome: Not Progressing   Problem: Health Behavior/Discharge Planning: Goal: Ability to identify and utilize available resources and services will improve Outcome: Not Progressing Goal: Ability to manage health-related needs will improve Outcome: Not Progressing   Problem: Metabolic: Goal: Ability to maintain appropriate glucose levels will improve Outcome: Not Progressing   Problem: Nutritional: Goal: Maintenance of adequate nutrition will improve Outcome: Not Progressing Goal: Progress toward achieving an optimal weight will improve Outcome: Not Progressing   Problem: Skin Integrity: Goal: Risk for impaired skin integrity will decrease Outcome: Not Progressing   Problem: Tissue Perfusion: Goal: Adequacy of tissue perfusion will improve Outcome: Not Progressing

## 2023-09-02 NOTE — Progress Notes (Signed)
   09/02/23 1655  Adult Ventilator Settings  Vent Mode (S)  PRVC   Pt was switched to [facility] mode per CCM. Pt is tolerating well at this time.

## 2023-09-02 NOTE — Progress Notes (Addendum)
 09/02/2023 Called to bedside for hypoxemia. Unable to ventilate or bag.  Sats 50s then 40s. Within 20 seconds went brady and then arrested. Chest compressions started; epi/bicarb given and ROSC achieved within 1-2 mins Intubated from above; positioned between tracheal stoma and carina by bronchoscope. Will revise trach with Jeralyn Banner once things settle out  Rolan Sharps MD PCCM

## 2023-09-02 NOTE — Progress Notes (Signed)
 Palliative Medicine Progress Note   Patient Name: Joshua Eaton       Date: 09/02/2023 DOB: April 02, 1956  Age: 68 y.o. MRN#: [id number] Attending Physician: Mannam, Praveen, MD Primary Care Physician: Delores Corean Pollen, MD Admit Date: 08/13/2023    HPI/Patient Profile: 68 y.o. male  with past medical history of colon cancer s/p right hemicolectomy 2007, COPD, and MVR.   He was initially hospitalized 05/20/23 with ARDS, which resulted in chronic respiratory failure and vent dependence.  His hospital course has been complicated by severe MR, new onset A-fib, trach dislodgment, DVT, and acute blood loss anemia.    He was admitted from [facility] on 08/13/2023 with concern for GI bleeding.  Palliative Medicine was consulted for goals of care.   Significant Events: 05/20/23 - Initially presented to [facility] with increasing shortness of breath, admitted with ARDS, required intubation on 05/21/23.  06/03/23 - transferred to [facility] due to high vent requirements 06/06/23 - underwent tracheostomy 06/09/23 - TEE showed severe MR; not a surgical candidate per CT surgery 06/16/23 - diagnosed with left upper extremity DVT, started on anticoaglation 06/22/23 - went into new onset a-fib 06/28/23 - transferred to [facility], and later that day had an accidental trach dislodgement 07/01/23 - readmitted to [facility] due to increased work of breathing requiring intubation 07/05/23 - underwent tracheostomy 07/13/23 - discharged back to [facility] 08/13/23 - readmitted to [facility] with decrease in Hgb and concern for GIB 08/15/23 - EGD showed ulcer around PEG site but no active bleeding 08/23/23 - tracheoscopy showed no active bleeding and no significant erosion 08/31/23 - worsening respiratory status, trach  repositioned, started meropenum for HAP   Subjective: Chart reviewed. This morning, patient had a sudden episode of hypoxemia that progressed to bradycardia and then PEA arrest. ROSC achieved within 1-2 minutes. During this time, the trach was removed and he was intubated from above.   Discussed with Jeralyn Banner NP regarding family request for transfer to tertiary facility. Denied transfer to [facility] and [facility] on 1/1; he is on the wait list at [facility] but there are currently no beds.  Bedside visit with wife.  She is understandably upset from this morning's event.  I provided emotional support and reassurance that patient seems to have stabilized.    Objective:  Physical Exam Vitals reviewed.  Constitutional:  General: He is not in acute distress.    Appearance: He is ill-appearing.  Pulmonary:     Comments: Intubated Neurological:     Comments: Neuro status not assessed             Palliative Medicine Assessment & Plan   Assessment: Principal Problem:   Acute respiratory failure with hypoxia (HCC) Active Problems:   Severe mitral regurgitation   On mechanically assisted ventilation (HCC)   Tracheostomy dependence (HCC)   Moderate aortic insufficiency   Malnutrition of moderate degree   PEG (percutaneous endoscopic gastrostomy) status (HCC)   Sacral ulcer (HCC)   Paroxysmal atrial fibrillation (HCC)   DVT (deep venous thrombosis) (HCC)   HTN (hypertension)   Rash   Pneumonia due to Serratia marcescens (HCC)   ARDS (adult respiratory distress syndrome) (HCC)   Tracheomalacia   Thrombocytopenia (HCC)    Recommendations/Plan: Continue full scope care Goal of care is medical stabilization Family has requested transfer to another tertiary medical center PMT will continue to follow and support  Code Status: Full code   Prognosis:  Unable to determine  Discharge Planning: To Be Determined   Thank you for allowing the Palliative Medicine Team to assist in  the care of this patient.   Time: 25 minutes   Recardo KATHEE Loll, NP   Please contact Palliative Medicine Team phone at [phone] for questions and concerns.  For individual providers, please see AMION.

## 2023-09-03 ENCOUNTER — Inpatient Hospital Stay (HOSPITAL_COMMUNITY): Payer: Medicare Other

## 2023-09-03 DIAGNOSIS — R609 Edema, unspecified: Secondary | ICD-10-CM | POA: Diagnosis not present

## 2023-09-03 DIAGNOSIS — J9601 Acute respiratory failure with hypoxia: Secondary | ICD-10-CM | POA: Diagnosis not present

## 2023-09-03 LAB — COMPREHENSIVE METABOLIC PANEL WITH GFR
ALT: 14 U/L (ref 0–44)
AST: 24 U/L (ref 15–41)
Albumin: 1.5 g/dL — ABNORMAL LOW (ref 3.5–5.0)
Alkaline Phosphatase: 61 U/L (ref 38–126)
Anion gap: 10 (ref 5–15)
BUN: 63 mg/dL — ABNORMAL HIGH (ref 8–23)
CO2: 39 mmol/L — ABNORMAL HIGH (ref 22–32)
Calcium: 8.6 mg/dL — ABNORMAL LOW (ref 8.9–10.3)
Chloride: 89 mmol/L — ABNORMAL LOW (ref 98–111)
Creatinine, Ser: 1.01 mg/dL (ref 0.61–1.24)
GFR, Estimated: >60 mL/min
Glucose, Bld: 135 mg/dL — ABNORMAL HIGH (ref 70–99)
Potassium: 4.7 mmol/L (ref 3.5–5.1)
Sodium: 138 mmol/L (ref 135–145)
Total Bilirubin: 1 mg/dL (ref 0.0–1.2)
Total Protein: 5.1 g/dL — ABNORMAL LOW (ref 6.5–8.1)

## 2023-09-03 LAB — CBC
HCT: 23.7 % — ABNORMAL LOW (ref 39.0–52.0)
Hemoglobin: 7.5 g/dL — ABNORMAL LOW (ref 13.0–17.0)
MCH: 29.8 pg (ref 26.0–34.0)
MCHC: 31.6 g/dL (ref 30.0–36.0)
MCV: 94 fL (ref 80.0–100.0)
Platelets: 147 K/uL — ABNORMAL LOW (ref 150–400)
RBC: 2.52 MIL/uL — ABNORMAL LOW (ref 4.22–5.81)
RDW: 16.8 % — ABNORMAL HIGH (ref 11.5–15.5)
WBC: 7.3 K/uL (ref 4.0–10.5)
nRBC: 0.3 % — ABNORMAL HIGH (ref 0.0–0.2)

## 2023-09-03 LAB — GLUCOSE, CAPILLARY
Glucose-Capillary: 110 mg/dL — ABNORMAL HIGH (ref 70–99)
Glucose-Capillary: 128 mg/dL — ABNORMAL HIGH (ref 70–99)
Glucose-Capillary: 122 mg/dL — ABNORMAL HIGH (ref 70–99)
Glucose-Capillary: 117 mg/dL — ABNORMAL HIGH (ref 70–99)
Glucose-Capillary: 113 mg/dL — ABNORMAL HIGH (ref 70–99)
Glucose-Capillary: 110 mg/dL — ABNORMAL HIGH (ref 70–99)

## 2023-09-03 LAB — MAGNESIUM: Magnesium: 2.5 mg/dL — ABNORMAL HIGH (ref 1.7–2.4)

## 2023-09-03 MED ORDER — LOPERAMIDE HCL 1 MG/7.5ML PO SUSP
2.0000 mg | Freq: Two times a day (BID) | ORAL | Status: DC | PRN
  Administered 2023-09-10 – 2023-09-12 (×2): 2 mg
  Filled 2023-09-03 (×2): qty 15

## 2023-09-03 MED ORDER — VITAMIN C 500 MG PO TABS
250.0000 mg | ORAL_TABLET | Freq: Two times a day (BID) | ORAL | Status: DC
  Administered 2023-09-03 – 2023-09-14 (×23): 250 mg
  Filled 2023-09-03 (×22): qty 1

## 2023-09-03 MED ORDER — GUAIFENESIN 100 MG/5ML PO LIQD
15.0000 mL | Freq: Four times a day (QID) | ORAL | Status: DC
Start: 2023-09-03 — End: 2023-09-15
  Administered 2023-09-03 – 2023-09-15 (×48): 15 mL
  Filled 2023-09-03 (×41): qty 15

## 2023-09-03 MED ORDER — ACETAZOLAMIDE SODIUM 500 MG IJ SOLR
250.0000 mg | Freq: Once | INTRAMUSCULAR | Status: AC
  Administered 2023-09-03: 250 mg via INTRAVENOUS
  Filled 2023-09-03: qty 250

## 2023-09-03 NOTE — Progress Notes (Signed)
 Lower extremity venous duplex completed. Please see CV Procedures for preliminary results.  Initial findings reported to Stormy Card, RN.  Shona Simpson, RVT 09/03/23 2:26 PM

## 2023-09-03 NOTE — Plan of Care (Signed)
  Problem: Nutrition: Goal: Adequate nutrition will be maintained Outcome: Progressing   Problem: Education: Goal: Knowledge of General Education information will improve Description: Including pain rating scale, medication(s)/side effects and non-pharmacologic comfort measures Outcome: Not Progressing   Problem: Clinical Measurements: Goal: Will remain free from infection Outcome: Not Progressing Goal: Diagnostic test results will improve Outcome: Not Progressing Goal: Respiratory complications will improve Outcome: Not Progressing Goal: Cardiovascular complication will be avoided Outcome: Not Progressing

## 2023-09-03 NOTE — Progress Notes (Signed)
 NAME:  Joshua Eaton, MRN:  [id number], DOB:  [birth year], LOS: 20 ADMISSION DATE:  08/13/2023, CONSULTATION DATE:  12/13 REFERRING MD:  Dr. Yolande, CHIEF COMPLAINT:  GIB   History of Present Illness:  Patient is a 68 yo M w/ pertinent PMH chronic respiratory failure w/ tracheostomy/[facility], Afib on eliquis  presents to [facility] ED on 12/13 w/ GIB.  Patient recently admitted w/ respiratory failure and pna in October 2024 requiring tracheostomy. He was later transferred to [facility] on 10/[facility]. Later that day had an accidental trach dislodgement. His wob continued to increase requiring re-admission to [facility] on 10/31 and was intubated and later trached during hospital admission. Patient discharged [facility].   On 12/13 patient admitted to [facility] w/ drop in hgb and concern for GIB. GI consulted for possible endoscopy. TRH to admit and PCCM to consult for vent management.  Pertinent  Medical History   Past Medical History:  Diagnosis Date   Anxiety    Cancer (HCC)    Depression    Hypertension    OCD (obsessive compulsive disorder)    Significant Hospital Events: Including procedures, antibiotic start and stop dates in addition to other pertinent events   12/13 admitted w/ GIB. Pccm to consult for vent management 12/14 1 unit pRBC 12/15 EGD - erosion around PEG site but no active bleeding; cratered ulcer biopsied near peg site.  12/16 plugging with clots- posterior wall bleeding from distal end of trach. Hemoptysis secondary to tracheostomy malpositioning.  This is resolved. -Patient had originally been on IV heparin  for DVT.  Had tracheostomy in place, and developed fairly significant hemoptysis back on 12/16.  Bronchoscopy identified the distal tip of the ostomy abutting the posterior wall of the trachea with small amount of ulceration.  He received TXA, heavy sedation, anticoagulation was held. 12/23 family fired pccm 12/24 pccm asked to  re-engage by ENT via RN staff .  12/29 # 7 adjustable length cuffed biovona placed set at 110 mm wife updated  12/30 Bedside bronchoscopy with BAL of RML 12/31 Worsening status with difficulty to ventilate, sedated, paralyzed, trach repositioned.  Started meropenem  for Serratia HAP 1/1 follow up for  biovona adjustable trach, still could not achieve optimal positioning with distal tip at posterior wall.  Ultimately required #6 proximal XLT Brovona changed trach to 6 prox xlt. Excellent mid-line positioning. Improved gas exchange.  1/2 was doing well. NMB discontinued. CXR was looking better. We were escalating diuretics. Had sudden desaturation, Nursing reports large plugs removed but remained hypoxic. RT was NOT able to pass suction cath when they arrived to assist. Also not able to provide manual BMV.  This progressed to bradycardic and then PEA cardiac arrest. Had 1 round ~ 3 minutes CPR. During this time Trach removed (as could not pass) intubated w/ # 7.5 ETT from above.   Interim History / Subjective:  Weaning pressors No events overnight  Objective   Blood pressure (!) 134/50, pulse 70, temperature 98.1 F (36.7 C), temperature source Axillary, resp. rate (!) 22, height 6' 0.99 (1.854 m), weight 104.8 kg, SpO2 96%.    Vent Mode: PRVC FiO2 (%):  [40 %-100 %] 50 % Set Rate:  [22 bmp] 22 bmp Vt Set:  [480 mL] 480 mL PEEP:  [5 cmH20-10 cmH20] 8 cmH20 Plateau Pressure:  [14 cmH20-26 cmH20] 25 cmH20   Intake/Output Summary (Last 24 hours) at 09/03/2023 9090 Last data filed at 09/03/2023 0900 Gross per 24 hour  Intake 3624.56 ml  Output 4150 ml  Net -525.44 ml   Filed Weights   08/30/23 0500 08/31/23 0435 09/02/23 0500  Weight: 97.1 kg 97.6 kg 104.8 kg   Examination: General:  AoC ill appearing elderly male sedated on MV in NAD HEENT: pupils 3/r, ETT, dressing over stoma site Neuro: sedated CV: rr, +murmur PULM:  MV supported, scattered rhonchi GI: soft, bs+, ND, PEG, foley cyu,  FMS Extremities: warm/dry, generalized  edema    ceFEPime  (MAXIPIME ) IV Stopped (09/03/23 9394)   feeding supplement (JEVITY 1.5 CAL/FIBER) 60 mL/hr at 09/03/23 1000   fentaNYL  infusion INTRAVENOUS 100 mcg/hr (09/03/23 1000)   norepinephrine  (LEVOPHED ) Adult infusion 7 mcg/min (09/03/23 1000)   propofol  (DIPRIVAN ) infusion 35 mcg/kg/min (09/03/23 1000)   UOP 3.2L/ 24hrs Net +14.4L Stool Tmax 99.6 Labs> bicarb 39, sCr 1.01, BUN 63, Mag 2.5, WBC 7.3, H/H stable, plts 113> 147  Resolved Hospital Problem list   Hypokalemia HTN AKI Hemoptysis (resolved) secondary to sxn trauma  Hyperbilirubinemia Petechial rash resolved Rash on knee-- treated for  ringworm w/ antifungal X 2 weeks  Assessment & Plan:  Acute on chronic hypoxic and hypercarbic respiratory failure with trach/ vent dependence since 06/2023 Serratia HAP w/ ARDS  Tracheomalacia  - unclear with his deconditioning, mitral valve disease, and also tracheomalacia still not clear if he can be liberated from mechanical ventilation - trach out 1/2 due to obstruction/ inability to ventilate Plan - cont full MV support, 4-8cc/kg IBW with goal Pplat <30 and DP<15.    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  Family requesting transfer to tertiary facility Plan - denied transfer to [facility] and [facility] on 1/1, [facility] with no beds, on wait list - telemetry monitoring - cont diuresing as hemodynamically/ renal tolerated.  Given increasing bicarb, add dose of acetazolamide   Post cardiac arrest shock. 1/2   Cardiac arrest precipitated by airway obstruction, ? mucous plugging, with residual hypotension 2/2 sedating meds Plan - cont weaning NE for MAP goal > 60, SBP > 100 given low diastolic's - trend CVP with diuresis - cont abx as above - avoid fever, continue supportive care  Paroxysmal Afib Plan - currently in NSR - cont amio per tube - optimize electrolytes, Mag > 2, K > 4, Ca - remains off AC due to concerns of GIB  ABLA 2/2 UGIB/ duodenitis, now more anemia of critical illness Thrombocytopenia, stable, suspected 2/2 sepsis - off AC since 12/5 Plan - H/H remains stable, plts improving, trend CBC  - no obvious source of bleeding - PPI BID - transfuse for Hgb < 7 or active bleeding  LUE provoked DVT from picc line Difficult IV access  Plan PICC line was removed back on 12/14 from the left upper extremity.  US  neg for RUE DVT - remains off systemic AC given prior bleeding issues.  Avoid PICC in LUE.  CVL since 12/14, BC neg from 12/23. Will need to consider RUE PICC.   Acute metabolic encephalopathy superimposed on history of anxiety Plan - infectious tx as  above - PAD protocol as above - cont seroquel  to minimize gtts - cont neuro protective measures w/hemodynamic support   Total body water  overload with recent AKI, now resolved Plan - cont lasix  w/ diamox  today, diuresing well w/ good UOP and stable sCr but ongoing significant anasarca - cont FWF - strict I/Os, daily renal panels  Unstagable pressure ulcer on sacrum POA Plan - cont wound care and nutrition support  Moderate protein calorie malnutrition  Plan  - TF per PEG  Ring worm left knee Had two weeks of rx. Better but not resolved Plan - Lamicil cream started for second round 1/2, if  no improvement over next 7 d consider oral rx VT   Mild hyperglycemia Plan - prn SSI/ CBG q 4  Severe deconditioning  Plan - will need extensive rehabilitation, and ongoing focus on nutritional support.  Almost  certainly will need LTAC setting again in the future - PT   Diarrhea Plan - continue fiber, imodium  prn, monitor output  Discharge planning -remains difficult situation.  Ultimately needs surgery for his valvular disease however his degree of deconditioning, malnutrition, and ventilator dependence continue to be a barrier.  Not a surgical candidate back in October and certainly not now.  Will need extensive rehabilitation efforts and unclear if liberation from mechanical ventilation is an option.  Family continue to request transfer.  Denied 1/2 by [facility] and [facility].  Remains on wait list for [facility].    Best Practice (right click and Reselect all SmartList Selections daily)   Diet/type: tubefeeds DVT prophylaxis LMWH Pressure ulcer(s): present on admission  GI prophylaxis: PPI Lines: Central line Foley:  Yes, and it is still needed Code Status:  full code Last date of multidisciplinary goals of care discussion [] .  Wife updated in detail at bedside 12/31 and 1/1  Wife updated at bedside by Dr. Annella 1/3 am  Critical care time: 35 min        Lyle Pesa, MSN, AG-ACNP-BC [facility] 09/03/2023, 9:09 AM  See Amion for pager If no response to pager , please call [phone] until 7pm After 7:00 pm call Elink  [phone]  .

## 2023-09-03 NOTE — Progress Notes (Signed)
 Nutrition Follow-up  DOCUMENTATION CODES:   Non-severe (moderate) malnutrition in context of chronic illness  INTERVENTION:  Continue TF via PEG: Jevity 1.5 at 52ml/hr ( per day) 60ml Prosource TF20 BID q4h free water  flushes (900ml per day) Provides 2320 kcal, 132g protein, free water  daily   Discontinue Juven to assess whether this is contributing to loose stools  Add Vitamin C  250mg  BID to support wound healing   Banatrol BID-provides 45kcal, 5g soluble fiber and 2g protein per serving. Consider increasing on follow up if loose stools continue  Once pt medically stable and off pressor support, consider transitioning to bolus tube feedings: 6 cartons Jevity 1.5 daily 60ml Prosource TF20 BID Provides 2290 kcal, 131g protein, free water  daily  NUTRITION DIAGNOSIS:   Moderate Malnutrition related to chronic illness as evidenced by severe muscle depletion, moderate muscle depletion, mild fat depletion. - remains applicable  GOAL:   Patient will meet greater than or equal to 90% of their needs - goal met via TF  MONITOR:   Vent status, TF tolerance, Skin  REASON FOR ASSESSMENT:   Consult Enteral/tube feeding initiation and management  ASSESSMENT:   68 yo male admitted with possible GI Bleed from [facility]; pt with hx of chronic respiratory failure requiring chronic trach/vent. Noted pt with prolonged hospital stay on October for pneumonia, severe MR, pulmonary edema requiring trach/PEG placement. PMH includes HTN, depression/amxiety, OCD  12/15 - EGD showed ulcer around PEG site but no active bleeding  12/16 bronchoscopy- plugging with clots- posterior wall bleeding from distal end of trach 12/30 - mucous plugging; s/p bronchoscopy  01/02 - sudden desaturation; large mucus plug removed; PEA arrest; trach removed; intubated via ETT  Spoke with pt's wife at bedside. No current nutrition related concerns/questions at this time. Discussed  holding Juven and transitioning to Vitamin C  to see if this would help with loose stools. Once more medically stable, can also consider transitioning to bolus TF regimen which may allow time for GI rest between providing nutrition.   Admit weight: 86.6 kg  Current weight (1/2): 104.8 kg + edema: mild pitting generalized, moderate pitting BUE  UOP: x24 hours I/O's: +14.7L since 12/20  Drains/lines: FMS- output x24 hours  Medications: lasix  40mg  q8h, SSI 0-15 units q4h, protonix  Drips:  Abx Fentanyl  Levo @ 9mcg/min Propofol  @ 20.5 ml/hr (provides 541 kcal per day at current rate)  Labs:  BUN 135 Mg 2.5 CBG's 102-128 x24 hours  Diet Order:   Diet Order             Diet NPO time specified  Diet effective now                   EDUCATION NEEDS:   Not appropriate for education at this time  Skin:  Skin Integrity Issues:: Unstageable Unstageable: sacrum Other: round rash on L knee; Petechial rash on hands and fee  Last BM:  x24 hours via FMS  Height:   Ht Readings from Last 1 Encounters:  08/18/23 6' 0.99 (1.854 m)    Weight:   Wt Readings from Last 1 Encounters:  09/02/23 104.8 kg   BMI:  Body mass index is 30.49 kg/m.  Estimated Nutritional Needs:   Kcal:  2200-2400 kcals  Protein:  125-150 g  Fluid:  >/= 2L  Royce Maris, RDN, LDN Clinical Nutrition

## 2023-09-03 NOTE — Progress Notes (Signed)
 Pt's stoma site was redressed by RT. Vaseline gauze, regular gauze, and tape was placed on the top of stoma site.

## 2023-09-04 ENCOUNTER — Other Ambulatory Visit: Payer: Self-pay

## 2023-09-04 ENCOUNTER — Inpatient Hospital Stay (HOSPITAL_COMMUNITY): Payer: Medicare Other

## 2023-09-04 DIAGNOSIS — J9601 Acute respiratory failure with hypoxia: Secondary | ICD-10-CM | POA: Diagnosis not present

## 2023-09-04 LAB — CBC
HCT: 23.3 % — ABNORMAL LOW (ref 39.0–52.0)
Hemoglobin: 7.3 g/dL — ABNORMAL LOW (ref 13.0–17.0)
MCH: 29.3 pg (ref 26.0–34.0)
MCHC: 31.3 g/dL (ref 30.0–36.0)
MCV: 93.6 fL (ref 80.0–100.0)
Platelets: 134 10*3/uL — ABNORMAL LOW (ref 150–400)
RBC: 2.49 MIL/uL — ABNORMAL LOW (ref 4.22–5.81)
RDW: 17 % — ABNORMAL HIGH (ref 11.5–15.5)
WBC: 8.8 10*3/uL (ref 4.0–10.5)
nRBC: 0 % (ref 0.0–0.2)

## 2023-09-04 LAB — TRIGLYCERIDES: Triglycerides: 91 mg/dL (ref <150)

## 2023-09-04 LAB — RENAL FUNCTION PANEL
Albumin: 1.6 g/dL — ABNORMAL LOW (ref 3.5–5.0)
Anion gap: 10 (ref 5–15)
BUN: 64 mg/dL — ABNORMAL HIGH (ref 8–23)
CO2: 38 mmol/L — ABNORMAL HIGH (ref 22–32)
Calcium: 8.7 mg/dL — ABNORMAL LOW (ref 8.9–10.3)
Chloride: 90 mmol/L — ABNORMAL LOW (ref 98–111)
Creatinine, Ser: 1.07 mg/dL (ref 0.61–1.24)
GFR, Estimated: >60 mL/min (ref >60)
Glucose, Bld: 132 mg/dL — ABNORMAL HIGH (ref 70–99)
Phosphorus: 3.8 mg/dL (ref 2.5–4.6)
Potassium: 4.2 mmol/L (ref 3.5–5.1)
Sodium: 138 mmol/L (ref 135–145)

## 2023-09-04 LAB — GLUCOSE, CAPILLARY
Glucose-Capillary: 116 mg/dL — ABNORMAL HIGH (ref 70–99)
Glucose-Capillary: 116 mg/dL — ABNORMAL HIGH (ref 70–99)
Glucose-Capillary: 157 mg/dL — ABNORMAL HIGH (ref 70–99)
Glucose-Capillary: 119 mg/dL — ABNORMAL HIGH (ref 70–99)
Glucose-Capillary: 98 mg/dL (ref 70–99)

## 2023-09-04 LAB — MAGNESIUM: Magnesium: 2.3 mg/dL (ref 1.7–2.4)

## 2023-09-04 MED ORDER — FUROSEMIDE 10 MG/ML IJ SOLN
40.0000 mg | Freq: Three times a day (TID) | INTRAMUSCULAR | Status: AC
  Administered 2023-09-04 – 2023-09-05 (×3): 40 mg via INTRAVENOUS
  Filled 2023-09-04 (×3): qty 4

## 2023-09-04 NOTE — Progress Notes (Addendum)
   Spoke to Dr Maximino of [facility] at [facility]- says no beds in [place] or [place] and declined transfer + lateral transfer -> they also called back later and said they are removing [name] from their list and for us  to call back in  >1 weeks to see if bed available but they are not considering [name] for acceptance   Also later RN said that family wantss tracheopexy instead of repeat tracheostomy - informed RN that thios wil lbe a ENT consult question during the week of Monday 09/06/23 and will call consult at that time   Updated wife 6:17 PM and he looked stable on roudnds  SIGNATURE    Dr. Dorethia Cave, M.D., F.C.C.P,  Pulmonary and Critical Care Medicine Staff Physician, [facility] Center Director - Interstitial Lung Disease  Program  Pulmonary Fibrosis [facility] at [facility] [place], [place], [address]   Pager: [phone], If no answer  -> Check AMION or Try [phone] Telephone (clinical office): [phone] Telephone (research): [phone]  1:12 PM 09/04/2023

## 2023-09-04 NOTE — Plan of Care (Signed)
  Problem: Activity: Goal: Ability to tolerate increased activity will improve Outcome: Not Progressing   Problem: Education: Goal: Knowledge of General Education information will improve Description: Including pain rating scale, medication(s)/side effects and non-pharmacologic comfort measures Outcome: Not Progressing   Problem: Clinical Measurements: Goal: Ability to maintain clinical measurements within normal limits will improve Outcome: Not Progressing Goal: Will remain free from infection Outcome: Not Progressing Goal: Diagnostic test results will improve Outcome: Not Progressing Goal: Respiratory complications will improve Outcome: Not Progressing

## 2023-09-04 NOTE — Progress Notes (Addendum)
 NAME:  Joshua Eaton, MRN:  [id number], DOB:  07/18/1956, LOS: 21 ADMISSION DATE:  08/13/2023, CONSULTATION DATE:  12/13 REFERRING MD:  Dr. Yolande, CHIEF COMPLAINT:  GIB   History of Present Illness:  Patient is a 68 yo M w/ pertinent PMH chronic respiratory failure w/ tracheostomy/[facility], Afib on eliquis  presents to [facility] ED on 12/13 w/ GIB.  Patient recently admitted w/ respiratory failure and pna in October 2024 requiring tracheostomy. [facility]. Later that day had an accidental trach dislodgement. His wob continued to increase requiring re-admission to [facility] on 10/31 and was intubated and later trached during hospital admission. [facility].   On 12/13 patient admitted to [facility] w/ drop in hgb and concern for GIB. GI consulted for possible endoscopy. TRH to admit and PCCM to consult for vent management.  Pertinent  Medical History   Past Medical History:  Diagnosis Date   Anxiety    Cancer (HCC)    Depression    Hypertension    OCD (obsessive compulsive disorder)    Significant Hospital Events: Including procedures, antibiotic start and stop dates in addition to other pertinent events   12/13 admitted w/ GIB. Pccm to consult for vent management 12/14 1 unit pRBC 12/15 EGD - erosion around PEG site but no active bleeding; cratered ulcer biopsied near peg site.  MRSA repsi culture 12/16 plugging with clots- posterior wall bleeding from distal end of trach. Hemoptysis secondary to tracheostomy malpositioning.  This is resolved. -Patient had originally been on IV heparin  for DVT.  Had tracheostomy in place, and developed fairly significant hemoptysis back on 12/16.  Bronchoscopy identified the distal tip of the ostomy abutting the posterior wall of the trachea with small amount of ulceration.  He received TXA, heavy sedation, anticoagulation was held. 12/23 family fired pccm - wife  indicated 09/04/23 this is incorrect 12/24 pccm asked to re-engage by ENT via RN staff .  12/29 # 7 adjustable length cuffed biovona placed set at 110 mm wife updated  TRAch Exchange 12/30 Bedside bronchoscopy with BAL of RML [facility] - SERRATIA  12/31 Worsening status with difficulty to ventilate, sedated, paralyzed, trach repositioned.  Started meropenem  for Serratia HAP STARTED diprivan  1/1 follow up for  biovona adjustable trach, still could not achieve optimal positioning with distal tip at posterior wall.  Ultimately required #6 proximal XLT Brovona changed trach to 6 prox xlt. Excellent mid-line positioning. Improved gas exchange.  TRACH EXCHANGE BRONCH 1/2 was doing well. NMB discontinued. CXR was looking better. We were escalating diuretics. Had sudden desaturation, Nursing reports large plugs removed but remained hypoxic. RT was NOT able to pass suction cath when they arrived to assist. Also not able to provide manual BMV.  This progressed to bradycardic and then PEA cardiac arrest. Had 1 round ~ 3 minutes CPR. During this time Trach removed (as could not pass) intubated w/ # 7.5 ETT from above.  1/3 - Weaning pressors. No events overnight  Interim History / Subjective:    09/04/23: Patient is on 50% oxygen on the ventilator.  On levophed  Wife is at the bedside.  She wanted me to record a few things to set the record straight.        -> She believes the patient needs another tracheopexy) but she does not want anyone in CCM doing trach anymore or bronch for trach . This is because family wants ENT consult regarding tracheomalacia  injury of trachea being repaired per tracheopexy plan discussed before another failed tracheotomy is attempted and plans to save airway   -> She is requesting transfer out consultation to [facility] and  [place].  I did indicate to her that we can resources extremely constrained and I am willing to try to make 1 call.  She prefer this, Call to be [facility] in [place].  I did indicate to her that I would try but I cannot guarantee and resources are better during the weekdays to make transfer request  -> She is also wants to be on records saying that she did not request dismissal of the critical care service and taking care of her husband. She only wanted 2nd opinon   > She is requesting plastic surgeon for the DTI.  Indicated to her that we will request wound care services to make this recommendation.   Objective   Blood pressure (!) 112/40, pulse 80, temperature 97.6 F (36.4 C), temperature source Oral, resp. rate 17, height 6' 0.99 (1.854 m), weight 105.8 kg, SpO2 91%. CVP:  [8 mmHg-12 mmHg] 9 mmHg  Vent Mode: PRVC FiO2 (%):  [50 %] 50 % Set Rate:  [22 bmp] 22 bmp Vt Set:  [480 mL] 480 mL PEEP:  [5 cmH20-8 cmH20] 8 cmH20 Plateau Pressure:  [25 cmH20-27 cmH20] 25 cmH20   Intake/Output Summary (Last 24 hours) at 09/04/2023 1138 Last data filed at 09/04/2023 1052 Gross per 24 hour  Intake 2938.54 ml  Output 5925 ml  Net -2986.46 ml   Filed Weights   08/31/23 0435 09/02/23 0500 09/04/23 0442  Weight: 97.6 kg 104.8 kg 105.8 kg   Examination: General Appearance:  Looks criticall ill OBESE - + Head:  Normocephalic, without obvious abnormality, atraumatic Eyes:  PERRL - yes, conjunctiva/corneas - muddy     Ears:  Normal external ear canals, both ears Nose:  G tube - no Throat:  ETT TUBE - yes , OG tube - no. HAS BANDAID fover NECK Neck:  Supple,  No enlargement/tenderness/nodules.  - R internal jugular CVL  +. No redness per RN Lungs: Clear to auscultation bilaterally, Ventilator   Synchrony - 50% fio2. Sync with vent Heart:  S1 and S2 normal, no murmur, CVP - no.  Pressors - YES 4mc levophed  Abdomen:  Soft, no masses, no organomegaly Genitalia / Rectal:  Not done Extremities:  Extremities- intact Skin:  ntact in exposed areas . Sacral area - DTI Neurologic:  Sedation - diprivan  gtt 08/31/23 and fent gtt -> RASS - -3  . Moves all 4s - x. CAM-ICU - cannot teset . Orientation -sedated      ceFEPime  (MAXIPIME ) IV Stopped (09/04/23 0508)   feeding supplement (JEVITY 1.5 CAL/FIBER) 60 mL/hr at 09/04/23 1000   fentaNYL  infusion INTRAVENOUS 100 mcg/hr (09/04/23 1000)   norepinephrine  (LEVOPHED ) Adult infusion 6 mcg/min (09/04/23 1000)   propofol  (DIPRIVAN ) infusion 35 mcg/kg/min (09/04/23 1000)     Resolved Hospital Problem list   Hypokalemia HTN AKI Hemoptysis (resolved) secondary to sxn trauma  Hyperbilirubinemia Petechial rash resolved Rash on knee-- treated for  ringworm w/ antifungal X 2 weeks  Assessment & Plan:  Acute on chronic hypoxic and hypercarbic respiratory failure with trach/ vent dependence since 06/2023 Serratia HAP w/ ARDS  Tracheomalacia  - unclear with his deconditioning, mitral valve disease, and also tracheomalacia still not clear if he can be liberated from mechanical ventilation - trach out 1/2 due to obstruction/ inability to ventilate  09/04/2023 - >  does NOT meet criteria for SBT/Extubation in setting of Acute Respiratory Failure due to shck, sedaton and 50% fio2. 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  Family requesting transfer to tertiary facility.. - denied transfer to [facility] and [facility] on 1/1, [facility] with no beds, on wait list  1/14 - no cagned  Plan - telemetry monitoring - cont diuresing as hemodynamically/ renal tolerated.  Given increasing bicarb, add dose of acetazolamide   Post cardiac arrest shock. 1/2  Cardiac arrest precipitated by airway  obstruction, ? mucous plugging, with residual hypotension 2/2 sedating meds  09/04/23: on low dose levophed   Plan - cont weaning NE for MAP goal > 60, SBP > 100 given low diastolic's - trend CVP  - diuresis on hold - cont abx as above - avoid fever, continue supportive care  Paroxysmal Afib  1/4- HR 73 and sinus  Plan -  cont amio per tube - optimize electrolytes, Mag > 2, K > 4, Ca - remains off AC due to concerns of GIB and recent trach bleeed (But on SQ Lovenox )  ABLA 2/2 UGIB/ duodenitis, now more anemia of critical illness Thrombocytopenia, stable, suspected 2/2 sepsis - off AC since 12/524 - on Loveenox 40mg  daily as of 09/04/23  1/4 /25: No active bleed  Plan - PPI BID - transfuse for Hgb < 7 or active bleeding  LUE provoked DVT from picc line Difficult IV access  - has RIJ placed 08/14/23 ( LUE PICC line was removed back on 12/14 f US  neg for RUE DVT)  plan - remains off systemic AC given prior bleeding issues.   - Avoid PICC in LUE. -   CVL since 12/14, BC neg from 12/23.  - COnsult for consider RUE PICC.   Acute metabolic encephalopathy superimposed on history of anxiety  1/4 - sedated  Plan - PAD protocol as above;  - diprivan  but check CK/KLactic acid 09/05/23 to ensure No diprivan  infusion sydnrme  - fent gtt - cont seroquel  to minimize gtts - cont neuro protective measures w/hemodynamic support   Total body water  overload with recent AKI, now resolved  1/4- wife concdrned about edema. Got lasix  40mg  tid x 48h ending yesterday.   Plan - cont lasix  tid x 24h and reasess 09/05/23 - stop free water  flushes - strict I/Os, daily renal panels  Unstagable pressure ulcer on sacrum POA Plan - cont wound care and nutrition support - woc to opine if plastic surgeon needed (Wife requesting)  Moderate protein calorie malnutrition  Plan  - TF per PEG  Ring worm left knee Had two weeks of rx. Better but not resolved Plan - Lamicil cream started for second  round 1/2, if  no improvement over next 7 d consider oral rx VT   Mild hyperglycemia Plan - prn SSI/ CBG q 4  Severe deconditioning  Plan - will need extensive rehabilitation, and ongoing focus on nutritional support.  Almost certainly will need LTAC setting again in the future - PT   Diarrhea Plan - continue fiber,  imodium  prn, monitor output  Discharge planning -remains difficult situation.  Ultimately needs surgery for his valvular disease however his degree of deconditioning, malnutrition, and ventilator dependence continue to be a barrier.  Not a surgical candidate back in October and certainly not now.  Will need extensive rehabilitation efforts and unclear if liberation from mechanical ventilation is an option.  Family continue to request transfer.     DISPO   - Denied transfer 1/2 by [facility] and [facility].  Remains on wait list for [facility].   - 09/03/23: will see if secretary can try to get [facility] on line - told wife not a guarantee they wil accept and strained winter/holioday weekend resources could prevent us  from reaching them successfully   Best Practice (right click and Reselect all SmartList Selections daily)   Diet/type: tubefeeds DVT prophylaxis LMWH Pressure ulcer(s): present on admission  GI prophylaxis: PPI Lines: Central line Foley:  Yes, and it is still needed Code Status:  full code Last date of multidisciplinary goals of care discussion [] .  Wife updated in detail at bedside 12/31 and 1/1  Wife updated at bedside by Dr. Annella 1/3 am  09/04/23 - Wife updated bedisee     ATTESTATION & SIGNATURE   The patient Joshua Eaton is critically ill with multiple organ systems failure and requires high complexity decision making for assessment and support, frequent evaluation and titration of therapies, application of advanced monitoring technologies and extensive interpretation of multiple databases and discussion with other appropriate health care personnel such  as bedside nurses, social workers, case production designer, theatre/television/film, consultants, respiratory therapists, nutritionists, secretaries etc.,  Critical care time includes but is not restricted to just documentation time. Documentation can happen in parallel or sequential to care time depending on case mix urgency and priorities for the shift. So, overall critical Care Time devoted to patient care services described in this note is  35  Minutes.   This time reflects time of care of this signee Dr Dorethia Cave which includ does not reflect procedure time, or teaching time or supervisory time of PA/NP/Med student/Med Resident etc but could involve care discussion time     Dr. Dorethia Cave, M.D., [facility].C.P Pulmonary and Critical Care Medicine Staff Physician, [facility] Pager: [phone], If no answer or between  15:00h - 7:00h: call [phone]  09/04/2023 12:18 PM    LABS    PULMONARY Recent Labs  Lab 08/31/23 1023 08/31/23 1650 08/31/23 2330 09/01/23 0913 09/01/23 1641 09/02/23 0911 09/02/23 1409  PHART 7.252* 7.14* 7.21*  --  7.378 7.522* 7.392  PCO2ART 85.2* 113* 97*  --  74.3* 61.4* 79.7*  PO2ART 56* 166* 184*  --  80* 49* 98  HCO3 37.3* 38.6* 38.7* 43.2* 44.2* 50.4* 48.5*  TCO2 40*  --   --   --  47* >50* >50*  O2SAT 80 100 99.2 89.4 95 87 97    CBC Recent Labs  Lab 09/02/23 0345 09/02/23 0911 09/02/23 1409 09/03/23 0550 09/04/23 0414  HGB 7.3*   < > 7.8* 7.5* 7.3*  HCT 22.7*   < > 23.0* 23.7* 23.3*  WBC 7.2  --   --  7.3 8.8  PLT 113*  --   --  147* 134*   < > = values in this interval not displayed.    COAGULATION No results for input(s): INR in the last 168 hours.  CARDIAC  No results for input(s): TROPONINI in the last  168 hours. No results for input(s): PROBNP in the last 168 hours.   CHEMISTRY Recent Labs  Lab 08/29/23 0315 08/30/23 0355 08/31/23 1007 08/31/23 1023 09/01/23 9361 09/01/23 1641  09/02/23 0345 09/02/23 0911 09/02/23 1409 09/03/23 0550 09/04/23 0414  NA 137   < > 138   < > 136   < > 136 135 136 138 138  K 4.1   < > 5.0   < > 5.0   < > 4.5 4.5 4.5 4.7 4.2  CL 99   < > 100  --  94*  --  89*  --   --  89* 90*  CO2 31   < > 32  --  36*  --  39*  --   --  39* 38*  GLUCOSE 116*   < > 148*  --  140*  --  143*  --   --  135* 132*  BUN 34*   < > 42*  --  52*  --  57*  --   --  63* 64*  CREATININE 1.01   < > 1.01  --  1.07  --  0.99  --   --  1.01 1.07  CALCIUM 8.5*   < > 8.9  --  8.7*  --  8.1*  --   --  8.6* 8.7*  MG 2.1  --   --   --   --   --   --   --   --  2.5* 2.3  PHOS  --   --   --   --   --   --   --   --   --   --  3.8   < > = values in this interval not displayed.   Estimated Creatinine Clearance: 83.2 mL/min (by C-G formula based on SCr of 1.07 mg/dL).   LIVER Recent Labs  Lab 08/30/23 0355 08/31/23 1007 09/01/23 0638 09/02/23 0345 09/03/23 0550 09/04/23 0414  AST 18 21 16 17 24   --   ALT 15 18 14 15 14   --   ALKPHOS 49 56 47 49 61  --   BILITOT 0.9 1.1 0.6 0.9 1.0  --   PROT 5.1* 6.3* 5.6* 4.9* 5.1*  --   ALBUMIN  1.7* 2.1* 1.8* <1.5* 1.5* 1.6*     INFECTIOUS Recent Labs  Lab 08/31/23 1007 08/31/23 1411 09/01/23 0640  LATICACIDVEN  --  1.1 0.8  PROCALCITON 2.80  --   --      ENDOCRINE CBG (last 3)  Recent Labs    09/04/23 0346 09/04/23 0721 09/04/23 1110  GLUCAP 116* 116* 157*         IMAGING x48h  - image(s) personally visualized  -   highlighted in bold DG Chest Port 1 View Result Date: 09/04/2023 CLINICAL DATA:  68 year old male with history of respiratory failure. EXAM: PORTABLE CHEST 1 VIEW COMPARISON:  Chest x-ray 09/02/2023. FINDINGS: There is a right-sided internal jugular central venous catheter with tip terminating in the distal superior vena cava. An endotracheal tube is in place with tip 2.0 cm above the carina. Lung volumes are low. Moderate bilateral pleural effusions. Bibasilar opacities which may reflect  areas of atelectasis and/or consolidation. Diffuse interstitial prominence and patchy multifocal ill-defined airspace disease scattered throughout the lungs bilaterally. No definite pneumothorax. Pulmonary vasculature is completely obscured. Heart size appears enlarged. The patient is rotated to the right on today's exam, resulting in distortion of the mediastinal contours and reduced diagnostic sensitivity  and specificity for mediastinal pathology. IMPRESSION: 1. Support apparatus, as above. 2. Unchanged radiographic appearance of the chest, which likely reflect severe multilobar bilateral pneumonia with moderate bilateral pleural effusions. Some degree of congestive heart failure is not excluded. Electronically Signed   By: Toribio Aye M.D.   On: 09/04/2023 08:01   VAS US  UPPER EXTREMITY VENOUS DUPLEX Result Date: 09/03/2023 UPPER VENOUS STUDY  Patient Name:  ALEKSANDER EDMISTON  Date of Exam:   09/03/2023 Medical Rec #: [id number]   Accession #:    [id number] Date of Birth: [birth year]    Patient Gender: M Patient Age:   44 years Exam Location:  [facility] Procedure:      VAS US  UPPER EXTREMITY VENOUS DUPLEX Referring Phys: MAUDE BANNER --------------------------------------------------------------------------------  Indications: Swelling Risk Factors: Immobility Cancer . Surgery Esophagogastroduodenoscopy 08/15/23. Limitations: Limited range of motion and bandages. Comparison Study: No significant changes seen since previous exam on left arm                   08/14/23. Significant changes seen since previous exam on                   right arm 08/23/23. Performing Technologist: Garnette Rockers  Examination Guidelines: A complete evaluation includes B-mode imaging, spectral Doppler, color Doppler, and power Doppler as needed of all accessible portions of each vessel. Bilateral testing is considered an integral part of a complete examination. Limited examinations for reoccurring indications may be performed as  noted.  Right Findings: +----------+------------+---------+-----------+-----------------+--------------+ RIGHT     CompressiblePhasicitySpontaneous   Properties       Summary     +----------+------------+---------+-----------+-----------------+--------------+ IJV                                                        Not visualized +----------+------------+---------+-----------+-----------------+--------------+ Subclavian    Full       Yes       Yes                                    +----------+------------+---------+-----------+-----------------+--------------+ Axillary      Full       Yes       Yes                                    +----------+------------+---------+-----------+-----------------+--------------+ Brachial      Full       Yes       Yes                                    +----------+------------+---------+-----------+-----------------+--------------+ Radial        Full       Yes       Yes                                    +----------+------------+---------+-----------+-----------------+--------------+ Ulnar         Full       Yes       Yes                                    +----------+------------+---------+-----------+-----------------+--------------+  Cephalic    Partial      No        Yes        brightly         Acute                                                    echogenic                   +----------+------------+---------+-----------+-----------------+--------------+ Basilic       Full                 Yes                                    +----------+------------+---------+-----------+-----------------+--------------+ Acute, non-occlusive, SVT seen in the CephV throughout the forearm.  Left Findings: +----------+------------+---------+-----------+-------------------+-------+ LEFT      CompressiblePhasicitySpontaneous    Properties     Summary  +----------+------------+---------+-----------+-------------------+-------+ IJV           Full       Yes       Yes                               +----------+------------+---------+-----------+-------------------+-------+ Subclavian    Full       Yes       Yes                               +----------+------------+---------+-----------+-------------------+-------+ Axillary      Full       Yes       Yes                               +----------+------------+---------+-----------+-------------------+-------+ Brachial      Full       Yes       Yes                               +----------+------------+---------+-----------+-------------------+-------+ Radial        Full       Yes       Yes                               +----------+------------+---------+-----------+-------------------+-------+ Ulnar         Full       Yes       Yes                               +----------+------------+---------+-----------+-------------------+-------+ Cephalic      Full       Yes       Yes    rigid w/compression Acute  +----------+------------+---------+-----------+-------------------+-------+ Basilic       Full                 Yes                               +----------+------------+---------+-----------+-------------------+-------+  Acute, non-occlusive, SVT seen in the CephV at the [facility].  Summary:  Right: No evidence of deep vein thrombosis in the upper extremity. Findings consistent with acute superficial vein thrombosis involving the right cephalic vein.  Left: No evidence of deep vein thrombosis in the upper extremity. Findings consistent with acute superficial vein thrombosis involving the left cephalic vein.  *See table(s) above for measurements and observations.  Diagnosing physician: Norman Serve Electronically signed by Norman Serve on 09/03/2023 at 3:58:38 PM.    Final    DG Chest Port 1 View Result Date: 09/02/2023 CLINICAL DATA:  Pneumonia. EXAM: PORTABLE CHEST 1  VIEW COMPARISON:  Same day. FINDINGS: Stable cardiomediastinal silhouette. Stable central pulmonary vascular congestion and bilateral lung opacities concerning for edema and pleural effusions. Right internal jugular catheter is unchanged. Interval placement of endotracheal tube with distal tip approximately 1 cm above the carina. Tracheostomy tube has been removed. IMPRESSION: Endotracheal tube is noted with distal tip approximately 1 cm above the carina. Stable central pulmonary vascular congestion and probable bilateral pulmonary edema and effusions. Electronically Signed   By: Lynwood Landy Raddle M.D.   On: 09/02/2023 15:03

## 2023-09-04 NOTE — Progress Notes (Addendum)
 eLink Physician-Brief Progress Note Patient Name: Joniel Graumann DOB: 04/22/1956 MRN: [id number]   Date of Service  09/04/2023  HPI/Events of Note    68 year old man chronic critical illness after prolonged ICU admission 06/2023 in the setting of severe mitral valve regurgitation and aortic valve regurgitation/CHF deemed not a surgical candidate, family decline offered to transfer elsewhere, was resident of LTAC for many weeks without ability to tolerate trach collar who was transferred back to the hospital with concern for GI bleed with course complicated by pneumonia and worsening hypoxemia as well as malposition tracheostomy tubes.  [facility] has an available bed and requested Doc to doc.  eICU Interventions  I spoke with Dr.Faraj** with [facility] who accepted the patient and will refer to an additional local doc for placement.  Awaiting callback with final accepting doc.   [phone] -callback from [facility]-unclear whether they would offer anything additional and has been to accept the transfer will readdress with morning team in a few hours.  Currently placed transfer on hold.  Intervention Category Minor Interventions: Communication with other healthcare providers and/or family  Ria Hemming 09/04/2023, 3:30 AM

## 2023-09-04 NOTE — Progress Notes (Signed)
 PICC order received: BUE edema and superficial thrombus. PICC not recommended. Troubleshooting done for distal lumen of internal jugular line. Brisk blood return noted. Recommend pulsatile flushes to maintain patency while in-line sampling set is in place. Recommend subclavian vs tunneled line for long term access. Discussed with Dr. Geronimo and Camellia, RN.

## 2023-09-05 ENCOUNTER — Inpatient Hospital Stay (HOSPITAL_COMMUNITY): Payer: Medicare Other

## 2023-09-05 DIAGNOSIS — J9601 Acute respiratory failure with hypoxia: Secondary | ICD-10-CM | POA: Diagnosis not present

## 2023-09-05 LAB — BRAIN NATRIURETIC PEPTIDE: B Natriuretic Peptide: 280.2 pg/mL — ABNORMAL HIGH (ref 0.0–100.0)

## 2023-09-05 LAB — CBC
HCT: 22.9 % — ABNORMAL LOW (ref 39.0–52.0)
Hemoglobin: 7.2 g/dL — ABNORMAL LOW (ref 13.0–17.0)
MCH: 29.6 pg (ref 26.0–34.0)
MCHC: 31.4 g/dL (ref 30.0–36.0)
MCV: 94.2 fL (ref 80.0–100.0)
Platelets: 114 10*3/uL — ABNORMAL LOW (ref 150–400)
RBC: 2.43 MIL/uL — ABNORMAL LOW (ref 4.22–5.81)
RDW: 17.1 % — ABNORMAL HIGH (ref 11.5–15.5)
WBC: 7.8 10*3/uL (ref 4.0–10.5)
nRBC: 0 % (ref 0.0–0.2)

## 2023-09-05 LAB — COMPREHENSIVE METABOLIC PANEL
ALT: 14 U/L (ref 0–44)
AST: 20 U/L (ref 15–41)
Albumin: <1.5 g/dL — ABNORMAL LOW (ref 3.5–5.0)
Alkaline Phosphatase: 58 U/L (ref 38–126)
Anion gap: 9 (ref 5–15)
BUN: 58 mg/dL — ABNORMAL HIGH (ref 8–23)
CO2: 38 mmol/L — ABNORMAL HIGH (ref 22–32)
Calcium: 8.6 mg/dL — ABNORMAL LOW (ref 8.9–10.3)
Chloride: 92 mmol/L — ABNORMAL LOW (ref 98–111)
Creatinine, Ser: 1.09 mg/dL (ref 0.61–1.24)
GFR, Estimated: >60 mL/min (ref >60)
Glucose, Bld: 117 mg/dL — ABNORMAL HIGH (ref 70–99)
Potassium: 3.9 mmol/L (ref 3.5–5.1)
Sodium: 139 mmol/L (ref 135–145)
Total Bilirubin: 0.8 mg/dL (ref 0.0–1.2)
Total Protein: 5.2 g/dL — ABNORMAL LOW (ref 6.5–8.1)

## 2023-09-05 LAB — GLUCOSE, CAPILLARY
Glucose-Capillary: 109 mg/dL — ABNORMAL HIGH (ref 70–99)
Glucose-Capillary: 110 mg/dL — ABNORMAL HIGH (ref 70–99)
Glucose-Capillary: 111 mg/dL — ABNORMAL HIGH (ref 70–99)
Glucose-Capillary: 112 mg/dL — ABNORMAL HIGH (ref 70–99)
Glucose-Capillary: 113 mg/dL — ABNORMAL HIGH (ref 70–99)
Glucose-Capillary: 118 mg/dL — ABNORMAL HIGH (ref 70–99)
Glucose-Capillary: 204 mg/dL — ABNORMAL HIGH (ref 70–99)

## 2023-09-05 LAB — PHOSPHORUS: Phosphorus: 4.6 mg/dL (ref 2.5–4.6)

## 2023-09-05 LAB — MAGNESIUM: Magnesium: 2.3 mg/dL (ref 1.7–2.4)

## 2023-09-05 LAB — LACTIC ACID, PLASMA: Lactic Acid, Venous: 0.8 mmol/L (ref 0.5–1.9)

## 2023-09-05 LAB — CK TOTAL AND CKMB (NOT AT ARMC)
CK, MB: 0.9 ng/mL (ref 0.5–5.0)
Total CK: 26 U/L — ABNORMAL LOW (ref 49–397)

## 2023-09-05 MED ORDER — ACETAZOLAMIDE 250 MG PO TABS
250.0000 mg | ORAL_TABLET | Freq: Once | ORAL | Status: AC
  Administered 2023-09-05: 250 mg
  Filled 2023-09-05: qty 1

## 2023-09-05 MED ORDER — ALBUMIN HUMAN 25 % IV SOLN
25.0000 g | Freq: Four times a day (QID) | INTRAVENOUS | Status: AC
  Administered 2023-09-05 – 2023-09-06 (×4): 25 g via INTRAVENOUS
  Filled 2023-09-05 (×4): qty 100

## 2023-09-05 MED ORDER — FUROSEMIDE 10 MG/ML IJ SOLN
40.0000 mg | Freq: Three times a day (TID) | INTRAMUSCULAR | Status: AC
Start: 2023-09-05 — End: 2023-09-06
  Administered 2023-09-05 – 2023-09-06 (×3): 40 mg via INTRAVENOUS
  Filled 2023-09-05 (×3): qty 4

## 2023-09-05 NOTE — Progress Notes (Signed)
 Bite bock placed per physician request without complication. ETT resecured at 24cm at the lip. No breakdown to the lips, tongue or cheeks noted. Pt tolerated well.

## 2023-09-05 NOTE — Progress Notes (Signed)
 NAME:  Joshua Eaton, MRN:  [id number], DOB:  Dec 02, 1955, LOS: 22 ADMISSION DATE:  08/13/2023, CONSULTATION DATE:  12/13 REFERRING MD:  Dr. Yolande, CHIEF COMPLAINT:  GIB   History of Present Illness:  Patient is a 68 yo M w/ pertinent PMH chronic respiratory failure w/ [facility], Afib on eliquis  presents to [facility] ED on 12/13 w/ GIB.  Patient recently admitted w/ respiratory failure and pna in October 2024 requiring tracheostomy. [facility]. Later that day had an accidental trach dislodgement. His wob continued to increase requiring re-admission to [facility] on 10/31 and was intubated and later trached during hospital admission. [facility]   On 12/13 patient admitted to [facility] w/ drop in hgb and concern for GIB. GI consulted for possible endoscopy. TRH to admit and PCCM to consult for vent management.  Pertinent  Medical History   Past Medical History:  Diagnosis Date   Anxiety    Cancer (HCC)    Depression    Hypertension    OCD (obsessive compulsive disorder)    Significant Hospital Events: Including procedures, antibiotic start and stop dates in addition to other pertinent events   12/13 admitted w/ GIB. Pccm to consult for vent management 12/14 1 unit pRBC 12/15 EGD - erosion around PEG site but no active bleeding; cratered ulcer biopsied near peg site.  12/16 plugging with clots- posterior wall bleeding from distal end of trach. Hemoptysis secondary to tracheostomy malpositioning.  This is resolved. Patient had originally been on IV heparin  for DVT.  Had tracheostomy in place, and developed fairly significant hemoptysis back on 12/16.  Bronchoscopy identified the distal tip of the ostomy abutting the posterior wall of the trachea with small amount of ulceration.  He received TXA, heavy sedation, anticoagulation was held. 12/23 family fired pccm 12/24 pccm asked to re-engage  by ENT via RN staff .  12/29 # 7 adjustable length cuffed biovona placed set at 110 mm wife updated  12/30 Bedside bronchoscopy with BAL of RML 12/31 Worsening status with difficulty to ventilate, sedated, paralyzed, trach repositioned.  Started meropenem  for Serratia HAP 1/1 follow up for  biovona adjustable trach, still could not achieve optimal positioning with distal tip at posterior wall.  Ultimately required #6 proximal XLT Brovona changed trach to 6 prox xlt. Excellent mid-line positioning. Improved gas exchange.  1/2 was doing well. NMB discontinued. CXR was looking better. We were escalating diuretics. Had sudden desaturation, Nursing reports large plugs removed but remained hypoxic. RT was NOT able to pass suction cath when they arrived to assist. Also not able to provide manual BMV.  This progressed to bradycardic and then PEA cardiac arrest. Had 1 round ~ 3 minutes CPR. During this time Trach removed (as could not pass) intubated w/ # 7.5 ETT from above.  1/3  Weaning pressors. No events overnight  1/4 Dr. Geronimo spoke to Dr Maximino of CCM at [facility]- says no beds in [place] or [place] and declined transfer + lateral transfer -> they also called back later and said they are removing [name] from their list and for us  to call back in  >1 weeks to see if bed available but they are not considering [name] for acceptance  1/5 No acute issues overnight, remains on fent and prop but off pressors.   Interim History / Subjective:  Sedated in bed on vent from ETT   Objective   Blood  pressure (!) 114/51, pulse 72, temperature 97.8 F (36.6 C), temperature source Oral, resp. rate (!) 22, height 6' 0.99 (1.854 m), weight 107.5 kg, SpO2 97%. CVP:  [0 mmHg-9 mmHg] 9 mmHg  Vent Mode: PRVC FiO2 (%):  [40 %-50 %] 40 % Set Rate:  [22 bmp] 22 bmp Vt Set:  [480 mL] 480 mL PEEP:  [8 cmH20] 8 cmH20 Plateau Pressure:  [24 cmH20-31 cmH20] 24 cmH20   Intake/Output Summary (Last 24  hours) at 09/05/2023 0820 Last data filed at 09/05/2023 0800 Gross per 24 hour  Intake 2211.51 ml  Output 3610 ml  Net -1398.49 ml   Filed Weights   09/02/23 0500 09/04/23 0442 09/05/23 0417  Weight: 104.8 kg 105.8 kg 107.5 kg    Examination: General: Acute on chronically ill appearing severely deconditioned middle aged male lying in bed on mechanical ventilation, in NAD HEENT: [place]/AT, MM pink/moist, PERRL,  Neuro: Sedated on vent CV: s1s2 regular rate and rhythm, no murmur, rubs, or gallops,  PULM:  Clear to auscultation, no increased work of breathing, no added breath sounds  GI: soft, bowel sounds active in all 4 quadrants, non-tender, non-distended, tolerating TF Extremities: warm/dry, generalized non pitting edema  Skin: Sacral decubitus     Resolved Hospital Problem list   Hypokalemia HTN AKI Hemoptysis (resolved) secondary to sxn trauma  Hyperbilirubinemia Petechial rash resolved Rash on knee-- treated for  ringworm w/ antifungal X 2 weeks Post cardiac arrest shock. 1/2  -Cardiac arrest precipitated by airway obstruction, ? mucous plugging, with residual hypotension 2/2 sedating meds  Assessment & Plan:  Acute on chronic hypoxic and hypercarbic respiratory failure with trach/vent dependence since 06/2023 Serratia HAP w/ ARDS  Tracheomalacia  -Largely driven by severe neuromuscular weakness but also due to intermittent infections, pneumonias. Currently with Serratia.  - trach out 1/2 due to obstruction/ inability to ventilate P: Continue ventilator support with lung protective strategies  Wean PEEP and FiO2 for sats greater than 90%. Head of bed elevated 30 degrees. Plateau pressures less than 30 cm H20.  Follow intermittent chest x-ray and ABG.   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  Family requesting transfer to tertiary facility -Denied at multiple medical centers  Paroxysmal Afib P: Continuous telemetry  Strict intake and output  Daily weight to assess volume status Daily assessment for need to diurese Closely monitor renal function and electrolytes  Continue Amio  Remains off AC due to history of GIB    ABLA 2/2 UGIB/ duodenitis, now more anemia of critical illness Thrombocytopenia, stable, suspected 2/2 sepsis - off AC since 12/5 P: H&H remains stable  Transfuse per protocol  Hgb goal > 7   LUE provoked DVT from picc line Difficult IV access  -PICC line was removed back on 12/14 from the left upper extremity.  US  neg for RUE DVT Remains off systemic AC given prior bleeding issues.  Avoid PICC in LUE.  CVL since 12/14, BC neg from 12/23. P: Continue supportive care may need to consider tunneled cath for prolonged access   Acute metabolic encephalopathy superimposed on history of anxiety P: Maintain neuro protective measures Wean sedation as able  Aspirations precautions  Continue seroquel  Wean sedation as able will avoid titration until airway  plan secured   Total body water  overload with recent AKI, now resolved P: Continue to diurese today with lasix  and Diamox  today  Schedule albumin   Strict intake and output    Unstagable pressure ulcer on sacrum POA P: Continue pressure alleviating devices  Consider air mattress  Local wound care  Nutritional support    Moderate protein calorie malnutrition  P: Continue TF  Protein supplementation   Ring worm left knee -Had two weeks of rx. Better but not resolved P: Continue Lamicil cream this is the second round of topical therapy consider Oral if not resolved    Mild hyperglycemia P: Continue SSI  CBG goal 140-180 CBG check q4  Severe deconditioning  P: Continue  PT/OT/SLP as able  Optimize nutrition   Diarrhea - improved  P: Continue fiber and as needed imodium    Discharge planning -remains difficult situation.  Ultimately needs surgery for his valvular disease however his degree of deconditioning, malnutrition, and ventilator dependence continue to be a barrier.  Not a surgical candidate back in October and certainly not now.  Will need extensive rehabilitation efforts and unclear if liberation from mechanical ventilation is an option.  Family continue to request transfer.  Denied 1/2 by [facility] and [facility].  Remains on wait list for [facility]. Denied at [facility] 1/4  Best Practice (right click and Reselect all SmartList Selections daily)   Diet/type: tubefeeds DVT prophylaxis LMWH Pressure ulcer(s): present on admission  GI prophylaxis: PPI Lines: Central line Foley:  Yes, and it is still needed Code Status:  full code Last date of multidisciplinary goals of care discussion Wife updated at bedside 1/4  Critical care time:    Performed by: Jaime Grizzell D. Harris  Total critical care time: 38 minutes  Critical care time was exclusive of separately billable procedures and treating other patients.  Critical care was necessary to treat or prevent imminent or life-threatening deterioration.  Critical care was time spent personally by me on the following activities: development of treatment plan with patient and/or surrogate as well as nursing, discussions with consultants, evaluation of patient's response to treatment, examination of patient, obtaining history from patient or surrogate, ordering and performing treatments and interventions, ordering and review of laboratory studies, ordering and review of radiographic studies, pulse oximetry and re-evaluation of patient's condition.  Jazmen Lindenbaum D. Harris, NP-C [place] [facility] Personal contact information can be found on Amion  If no contact or response made please call  [phone] 09/05/2023, 9:30 AM

## 2023-09-06 DIAGNOSIS — J9601 Acute respiratory failure with hypoxia: Secondary | ICD-10-CM | POA: Diagnosis not present

## 2023-09-06 LAB — GLUCOSE, CAPILLARY
Glucose-Capillary: 123 mg/dL — ABNORMAL HIGH (ref 70–99)
Glucose-Capillary: 105 mg/dL — ABNORMAL HIGH (ref 70–99)
Glucose-Capillary: 115 mg/dL — ABNORMAL HIGH (ref 70–99)
Glucose-Capillary: 128 mg/dL — ABNORMAL HIGH (ref 70–99)
Glucose-Capillary: 139 mg/dL — ABNORMAL HIGH (ref 70–99)
Glucose-Capillary: 116 mg/dL — ABNORMAL HIGH (ref 70–99)

## 2023-09-06 LAB — RENAL FUNCTION PANEL
Albumin: 2.4 g/dL — ABNORMAL LOW (ref 3.5–5.0)
Anion gap: 14 (ref 5–15)
BUN: 53 mg/dL — ABNORMAL HIGH (ref 8–23)
CO2: 35 mmol/L — ABNORMAL HIGH (ref 22–32)
Calcium: 9 mg/dL (ref 8.9–10.3)
Chloride: 94 mmol/L — ABNORMAL LOW (ref 98–111)
Creatinine, Ser: 1.06 mg/dL (ref 0.61–1.24)
GFR, Estimated: >60 mL/min (ref >60)
Glucose, Bld: 131 mg/dL — ABNORMAL HIGH (ref 70–99)
Phosphorus: 5.1 mg/dL — ABNORMAL HIGH (ref 2.5–4.6)
Potassium: 3.5 mmol/L (ref 3.5–5.1)
Sodium: 143 mmol/L (ref 135–145)

## 2023-09-06 LAB — MAGNESIUM: Magnesium: 2.2 mg/dL (ref 1.7–2.4)

## 2023-09-06 MED ORDER — POTASSIUM CHLORIDE 20 MEQ PO PACK
40.0000 meq | PACK | Freq: Once | ORAL | Status: AC
  Administered 2023-09-06: 40 meq
  Filled 2023-09-06: qty 2

## 2023-09-06 MED ORDER — FUROSEMIDE 10 MG/ML IJ SOLN
80.0000 mg | Freq: Three times a day (TID) | INTRAMUSCULAR | Status: AC
Start: 2023-09-06 — End: 2023-09-07
  Administered 2023-09-06 – 2023-09-07 (×3): 80 mg via INTRAVENOUS
  Filled 2023-09-06 (×3): qty 8

## 2023-09-06 NOTE — TOC Progression Note (Signed)
 Transition of Care [facility]) - Progression Note    Patient Details  Name: Joshua Eaton MRN: [id number] Date of Birth: 1956-03-25  Transition of Care [facility]) CM/SW Contact  Tom-Johnson, Harvest Muskrat, RN Phone Number: 09/06/2023, 2:08 PM  Clinical Narrative:     Patient continues to be intubated and sedated. Jamal out on 01/02 d/t obstruction and inability to Ventilate. ENT following, patient might need another formal Jamal, Jamal site is closed per ENT.  Family has been disappointed with care and requesting transfer to another Hospital. The trach site is closed. Care Team engaging with family.   Patient not Medically ready for discharge.  CM will continue to follow as patient progresses with care towards discharge.           Expected Discharge Plan and Services                                               Social Determinants of Health (SDOH) Interventions SDOH Screenings   Food Insecurity: No Food Insecurity (08/21/2023)  Housing: Unknown (08/21/2023)  Transportation Needs: No Transportation Needs (08/21/2023)  Utilities: Not At Risk (08/21/2023)  Financial Resource Strain: Patient Unable To Answer (07/14/2023)   Received from [facility]  Social Connections: Patient Unable To Answer (07/14/2023)   Received from [facility]  Stress: Patient Unable To Answer (07/14/2023)   Received from [facility]  Tobacco Use: Low Risk  (08/31/2023)   Received from [facility]    Readmission Risk Interventions    08/16/2023    3:55 PM  Readmission Risk Prevention Plan  Transportation Screening Complete  Medication Review (RN Care Manager) Referral to Pharmacy  PCP or Specialist appointment within 3-5 days of discharge Complete  HRI or Home Care Consult Complete  SW Recovery Care/Counseling Consult Complete  Skilled Nursing Facility Complete

## 2023-09-06 NOTE — Progress Notes (Signed)
 NAME:  Joshua Eaton, MRN:  [id number], DOB:  08-06-1956, LOS: 23 ADMISSION DATE:  08/13/2023, CONSULTATION DATE:  12/13 REFERRING MD:  Dr. Yolande, CHIEF COMPLAINT:  GIB   History of Present Illness:  Patient is a 68 yo M w/ pertinent PMH chronic respiratory failure w/ [facility], Afib on eliquis  presents to [facility] ED on 12/13 w/ GIB.  Patient recently admitted w/ respiratory failure and pna in October 2024 requiring tracheostomy. [facility] was [facility]. Later that day had an accidental trach dislodgement. His wob continued to increase requiring re-admission to [facility] on 10/31 and was intubated and later trached during hospital admission. [facility]   On 12/13 patient admitted to [facility] w/ drop in hgb and concern for GIB. GI consulted for possible endoscopy. TRH to admit and PCCM to consult for vent management.  Pertinent  Medical History   Past Medical History:  Diagnosis Date   Anxiety    Cancer (HCC)    Depression    Hypertension    OCD (obsessive compulsive disorder)    Significant Hospital Events: Including procedures, antibiotic start and stop dates in addition to other pertinent events   12/13 admitted w/ GIB. Pccm to consult for vent management 12/14 1 unit pRBC 12/15 EGD - erosion around PEG site but no active bleeding; cratered ulcer biopsied near peg site.  12/16 plugging with clots- posterior wall bleeding from distal end of trach. Hemoptysis secondary to tracheostomy malpositioning.  This is resolved. Patient had originally been on IV heparin  for DVT.  Had tracheostomy in place, and developed fairly significant hemoptysis back on 12/16.  Bronchoscopy identified the distal tip of the ostomy abutting the posterior wall of the trachea with small amount of ulceration.  He received TXA, heavy sedation, anticoagulation was held. 12/23 family fired pccm 12/24 pccm asked to re-engage  by ENT via RN staff .  12/29 # 7 adjustable length cuffed biovona placed set at 110 mm wife updated  12/30 Bedside bronchoscopy with BAL of RML 12/31 Worsening status with difficulty to ventilate, sedated, paralyzed, trach repositioned.  Started meropenem  for Serratia HAP 1/1 follow up for  biovona adjustable trach, still could not achieve optimal positioning with distal tip at posterior wall.  Ultimately required #6 proximal XLT Brovona changed trach to 6 prox xlt. Excellent mid-line positioning. Improved gas exchange.  1/2 was doing well. NMB discontinued. CXR was looking better. We were escalating diuretics. Had sudden desaturation, Nursing reports large plugs removed but remained hypoxic. RT was NOT able to pass suction cath when they arrived to assist. Also not able to provide manual BMV.  This progressed to bradycardic and then PEA cardiac arrest. Had 1 round ~ 3 minutes CPR. During this time Trach removed (as could not pass) intubated w/ # 7.5 ETT from above.  1/3  Weaning pressors. No events overnight  1/4 Dr. Geronimo spoke to Dr Maximino of CCM at [facility]- says no beds in [place] or [place] and declined transfer + lateral transfer -> they also called back later and said they are removing [name] from their list and for us  to call back in  >1 weeks to see if bed available but they are not considering [name] for acceptance  1/5 No acute issues overnight, remains on fent and prop but off pressors.   Interim History / Subjective:  No events. Sedated and fighting vent. Wife at bedside  Objective  Blood pressure (!) 111/55, pulse 80, temperature 98.5 F (36.9 C), temperature source Oral, resp. rate 18, height 6' 0.99 (1.854 m), weight 107.8 kg, SpO2 99%.    Vent Mode: PRVC FiO2 (%):  [40 %-70 %] 70 % Set Rate:  [22 bmp] 22 bmp Vt Set:  [480 mL] 480 mL PEEP:  [8 cmH20] 8 cmH20 Plateau Pressure:  [20 cmH20-24 cmH20] 20 cmH20   Intake/Output Summary (Last 24 hours) at  09/06/2023 9167 Last data filed at 09/06/2023 0700 Gross per 24 hour  Intake 2012.26 ml  Output 1830 ml  Net 182.26 ml   Filed Weights   09/04/23 0442 09/05/23 0417 09/06/23 0500  Weight: 105.8 kg 107.5 kg 107.8 kg    Examination: Sedated, double triggering vent Diffuse anasarca Stable rash L leg See image yesterday for wound Lungs harsh rhonci RASS -4 Pupils equal  BMP stable Fever curve benign Stubborn diuresis  Resolved Hospital Problem list   Hypokalemia HTN AKI Hemoptysis (resolved) secondary to sxn trauma  Hyperbilirubinemia Petechial rash resolved Rash on knee-- treated for  ringworm w/ antifungal X 2 weeks Post cardiac arrest shock. 1/2  -Cardiac arrest precipitated by airway obstruction, ? mucous plugging, with residual hypotension 2/2 sedating meds  Assessment & Plan:  Acute on chronic hypoxic and hypercarbic respiratory failure with trach/vent dependence since 06/2023 Serratia HAP w/ ARDS  Tracheomalacia  -Largely driven by severe neuromuscular weakness but also due to intermittent infections, pneumonias. Currently with Serratia.  - trach out 1/2 due to obstruction/ inability to ventilate P: - ENT consult for trach upsizing per family request: size 8-0 prox shiley might fit best; Dr. Roark will see - Vent bundle; once new trach in work toward PS trials again - Long term may be vent dependent due to severe tracheobronchomalacia cominbed with profound deconditioning; ? 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  Family requesting transfer to tertiary facility -Denied at multiple medical centers  Paroxysmal Afib Fluid overload P: Push diuresis Replete lytes PRN Unna boots Continue Amio  Remains off AC due to history of GIB    ABLA 2/2 UGIB/ duodenitis, now more anemia of critical illness Thrombocytopenia, stable, suspected 2/2 sepsis - off AC  since 12/5 P: H&H remains stable  Transfuse per protocol  Hgb goal > 7   LUE provoked DVT from picc line Difficult IV access  -PICC line was removed back on 12/14 from the left upper extremity.  US  neg for RUE DVT Remains off systemic AC given prior bleeding issues.  Avoid PICC in LUE.  CVL since 12/14, BC neg from 12/23. P: Continue supportive care may need to consider tunneled cath for prolonged access   Acute metabolic encephalopathy superimposed on history of anxiety P: Maintain neuro protective measures Wean sedation as able  Aspirations precautions  Continue seroquel  Wean sedation as able will avoid titration until airway plan secured    Unstagable pressure ulcer on sacrum POA P: Continue pressure alleviating devices  Consider air mattress  Local wound care  Nutritional support  Discussed with WOC: may try some hydrotherapy   Moderate protein calorie malnutrition  P: Continue TF  Protein supplementation   Ring worm left knee -Had two weeks of rx. Better but not resolved P: Continue Lamicil cream this is the second round of topical therapy consider Oral if not resolved    Mild hyperglycemia P: Continue SSI  CBG goal 140-180 CBG check q4  Severe deconditioning  P: Continue PT/OT/SLP as able  Optimize nutrition   Diarrhea - improved  P: Continue fiber and as needed imodium    Discharge planning -remains difficult situation.  Ultimately needs surgery for his valvular disease however his degree of deconditioning, malnutrition, and ventilator dependence continue to be a barrier.  Not a surgical candidate back in October and certainly not now.  Will need extensive rehabilitation efforts and unclear if liberation from mechanical ventilation is an option.  Family continue to request transfer.  Denied 1/2 by [facility] and [facility].  Remains on wait list for [facility]. Denied at [facility].  I do not think care plan will change regardless of location.  Summary of  today's changes: unna boots, higher diuretics, ENT consult, hydrotherapy  Best Practice (right click and Reselect all SmartList Selections daily)   Diet/type: tubefeeds DVT prophylaxis LMWH Pressure ulcer(s): present on admission  GI prophylaxis: PPI Lines: Central line Foley:  Yes, and it is still needed Code Status:  full code Last date of multidisciplinary goals of care discussion Wife updated at bedside 1/6  Critical care time:    Performed by: Toribio JAYSON Sharps  Total critical care time: 32 minutes  Critical care time was exclusive of separately billable procedures and treating other patients.  Critical care was necessary to treat or prevent imminent or life-threatening deterioration.  Critical care was time spent personally by me on the following activities: development of treatment plan with patient and/or surrogate as well as nursing, discussions with consultants, evaluation of patient's response to treatment, examination of patient, obtaining history from patient or surrogate, ordering and performing treatments and interventions, ordering and review of laboratory studies, ordering and review of radiographic studies, pulse oximetry and re-evaluation of patient's condition.  [place] [facility] Personal contact information can be found on Amion  If no contact or response made please call [phone] 09/06/2023, 8:32 AM

## 2023-09-06 NOTE — Consult Note (Signed)
 Reason for Consult:trach issue Referring Physician: Dr Claudene [name] is an 68 y.o. male.  HPI: hx of complex medical issues and has had a trach for 2 months. He had plugging of the trach and it was removed on FRiday. Pulmonary feels a larger trach will help so they want an upsize to #8. The family has been very disappointed with the care and wants transfer. The trach site is closed. He will need another formal trach.   Past Medical History:  Diagnosis Date   Anxiety    Cancer (HCC)    Depression    Hypertension    OCD (obsessive compulsive disorder)     Past Surgical History:  Procedure Laterality Date   BIOPSY  08/15/2023   Procedure: BIOPSY;  Surgeon: Leigh Elspeth SQUIBB, MD;  Location: [facility] ENDOS[facility];  Service: Gastroenterology;;   CHOLECYSTECTOMY     COLON SURGERY     cancerous polyp removed   ESOPHAGOGASTRODUODENOSCOPY (EGD) WITH PROPOFOL  N/A 08/15/2023   Procedure: ESOPHAGOGASTRODUODENOSCOPY (EGD) WITH PROPOFOL ;  Surgeon: Leigh Elspeth SQUIBB, MD;  Location: [facility] ENDOSCOPY;  Service: Gastroenterology;  Laterality: N/A;   HERNIA REPAIR     IR GASTROSTOMY TUBE MOD SED  06/24/2023    History reviewed. No pertinent family history.  Social History:  reports that he has never smoked. He has never been exposed to tobacco smoke. He has never used smokeless tobacco. He reports that he does not drink alcohol  and does not use drugs.  Allergies:  Allergies  Allergen Reactions   Codeine     sends me into orbit    Medications: I have reviewed the patient's current medications.  Results for orders placed or performed during the hospital encounter of 08/13/23 (from the past 48 hours)  Glucose, capillary     Status: Abnormal   Collection Time: 09/04/23 11:10 AM  Result Value Ref Range   Glucose-Capillary 157 (H) 70 - 99 mg/dL    Comment: Glucose reference range applies only to samples taken after fasting for at least 8 hours.  Glucose, capillary     Status: Abnormal    Collection Time: 09/04/23  3:14 PM  Result Value Ref Range   Glucose-Capillary 119 (H) 70 - 99 mg/dL    Comment: Glucose reference range applies only to samples taken after fasting for at least 8 hours.  Glucose, capillary     Status: None   Collection Time: 09/04/23  8:12 PM  Result Value Ref Range   Glucose-Capillary 98 70 - 99 mg/dL    Comment: Glucose reference range applies only to samples taken after fasting for at least 8 hours.  Glucose, capillary     Status: Abnormal   Collection Time: 09/05/23 12:16 AM  Result Value Ref Range   Glucose-Capillary 118 (H) 70 - 99 mg/dL    Comment: Glucose reference range applies only to samples taken after fasting for at least 8 hours.  Glucose, capillary     Status: Abnormal   Collection Time: 09/05/23  3:41 AM  Result Value Ref Range   Glucose-Capillary 112 (H) 70 - 99 mg/dL    Comment: Glucose reference range applies only to samples taken after fasting for at least 8 hours.  Magnesium      Status: None   Collection Time: 09/05/23  3:52 AM  Result Value Ref Range   Magnesium  2.3 1.7 - 2.4 mg/dL    Comment: Performed at [facility], [address], [place], [place] [address]  CBC     Status:  Abnormal   Collection Time: 09/05/23  3:52 AM  Result Value Ref Range   WBC 7.8 4.0 - 10.5 K/uL   RBC 2.43 (L) 4.22 - 5.81 MIL/uL   Hemoglobin 7.2 (L) 13.0 - 17.0 g/dL   HCT 77.0 (L) 60.9 - 47.9 %   MCV 94.2 80.0 - 100.0 fL   MCH 29.6 26.0 - 34.0 pg   MCHC 31.4 30.0 - 36.0 g/dL   RDW 82.8 (H) 88.4 - 84.4 %   Platelets 114 (L) 150 - 400 K/uL   nRBC 0.0 0.0 - 0.2 %    Comment: Performed at [facility], [address], [place], [place] [address]  Comprehensive metabolic panel     Status: Abnormal   Collection Time: 09/05/23  3:52 AM  Result Value Ref Range   Sodium 139 135 - 145 mmol/L   Potassium 3.9 3.5 - 5.1 mmol/L   Chloride 92 (L) 98 - 111 mmol/L   CO2 38 (H) 22 - 32 mmol/L   Glucose, Bld 117 (H) 70 - 99 mg/dL    Comment:  Glucose reference range applies only to samples taken after fasting for at least 8 hours.   BUN 58 (H) 8 - 23 mg/dL   Creatinine, Ser 8.90 0.61 - 1.24 mg/dL   Calcium 8.6 (L) 8.9 - 10.3 mg/dL   Total Protein 5.2 (L) 6.5 - 8.1 g/dL   Albumin  <1.5 (L) 3.5 - 5.0 g/dL   AST 20 15 - 41 U/L   ALT 14 0 - 44 U/L   Alkaline Phosphatase 58 38 - 126 U/L   Total Bilirubin 0.8 0.0 - 1.2 mg/dL   GFR, Estimated >39 >39 mL/min    Comment: (NOTE) Calculated using the CKD-EPI Creatinine Equation (2021)    Anion gap 9 5 - 15    Comment: Performed at [facility], [address], [place], [place] [address]  Lactic acid, plasma     Status: None   Collection Time: 09/05/23  3:52 AM  Result Value Ref Range   Lactic Acid, Venous 0.8 0.5 - 1.9 mmol/L    Comment: Performed at [facility], [address] [place], [place] [address]  CK total and CKMB (cardiac)not at [facility]     Status: Abnormal   Collection Time: 09/05/23  3:52 AM  Result Value Ref Range   Total CK 26 (L) 49 - 397 U/L   CK, MB 0.9 0.5 - 5.0 ng/mL    Comment: Performed at [facility], [address], [place], [place] [address]  Brain natriuretic peptide     Status: Abnormal   Collection Time: 09/05/23  3:52 AM  Result Value Ref Range   B Natriuretic Peptide 280.2 (H) 0.0 - 100.0 pg/mL    Comment: Performed at [facility], [address] [place], [place] [address]  Phosphorus     Status: None   Collection Time: 09/05/23  3:52 AM  Result Value Ref Range   Phosphorus 4.6 2.5 - 4.6 mg/dL    Comment: Performed at [facility], [address], [place], [place] [address]  Glucose, capillary     Status: Abnormal   Collection Time: 09/05/23  7:49 AM  Result Value Ref Range   Glucose-Capillary 110 (H) 70 - 99 mg/dL    Comment: Glucose reference range applies only to samples taken after fasting for at least 8 hours.  Glucose, capillary     Status: Abnormal   Collection  Time: 09/05/23 11:12 AM  Result Value Ref  Range   Glucose-Capillary 113 (H) 70 - 99 mg/dL    Comment: Glucose reference range applies only to samples taken after fasting for at least 8 hours.  Glucose, capillary     Status: Abnormal   Collection Time: 09/05/23  3:44 PM  Result Value Ref Range   Glucose-Capillary 204 (H) 70 - 99 mg/dL    Comment: Glucose reference range applies only to samples taken after fasting for at least 8 hours.  Glucose, capillary     Status: Abnormal   Collection Time: 09/05/23  7:36 PM  Result Value Ref Range   Glucose-Capillary 109 (H) 70 - 99 mg/dL    Comment: Glucose reference range applies only to samples taken after fasting for at least 8 hours.  Glucose, capillary     Status: Abnormal   Collection Time: 09/05/23 11:20 PM  Result Value Ref Range   Glucose-Capillary 111 (H) 70 - 99 mg/dL    Comment: Glucose reference range applies only to samples taken after fasting for at least 8 hours.  Glucose, capillary     Status: Abnormal   Collection Time: 09/06/23  3:29 AM  Result Value Ref Range   Glucose-Capillary 123 (H) 70 - 99 mg/dL    Comment: Glucose reference range applies only to samples taken after fasting for at least 8 hours.  Renal function panel     Status: Abnormal   Collection Time: 09/06/23  5:37 AM  Result Value Ref Range   Sodium 143 135 - 145 mmol/L   Potassium 3.5 3.5 - 5.1 mmol/L   Chloride 94 (L) 98 - 111 mmol/L   CO2 35 (H) 22 - 32 mmol/L   Glucose, Bld 131 (H) 70 - 99 mg/dL    Comment: Glucose reference range applies only to samples taken after fasting for at least 8 hours.   BUN 53 (H) 8 - 23 mg/dL   Creatinine, Ser 8.93 0.61 - 1.24 mg/dL   Calcium 9.0 8.9 - 89.6 mg/dL   Phosphorus 5.1 (H) 2.5 - 4.6 mg/dL   Albumin  2.4 (L) 3.5 - 5.0 g/dL   GFR, Estimated >39 >39 mL/min    Comment: (NOTE) Calculated using the CKD-EPI Creatinine Equation (2021)    Anion gap 14 5 - 15    Comment: Performed at [facility], [address] [place], [place] [address]  Magnesium       Status: None   Collection Time: 09/06/23  5:37 AM  Result Value Ref Range   Magnesium  2.2 1.7 - 2.4 mg/dL    Comment: Performed at [facility], [address], [place], [place] [address]  Glucose, capillary     Status: Abnormal   Collection Time: 09/06/23  7:32 AM  Result Value Ref Range   Glucose-Capillary 105 (H) 70 - 99 mg/dL    Comment: Glucose reference range applies only to samples taken after fasting for at least 8 hours.    DG CHEST PORT 1 VIEW Result Date: 09/05/2023 CLINICAL DATA:  68 year old male status post intubation. EXAM: PORTABLE CHEST 1 VIEW COMPARISON:  Chest x-ray 09/04/2023. FINDINGS: An endotracheal tube is in place with tip 2.1 cm above the carina. There is a right-sided internal jugular central venous catheter with tip terminating in the distal superior vena cava. Lung volumes are low. Diffuse interstitial prominence and patchy ill-defined opacities throughout the lungs bilaterally. Moderate bilateral pleural effusions. Bibasilar opacities which may reflect areas of atelectasis and/or consolidation. Heart  size appears enlarged. No definite pneumothorax. IMPRESSION: 1. Support apparatus, as above. 2. Overall, the appearance of the chest is very similar to the prior study. Based on comparison with prior chest CT, findings are favored to reflect multilobar pneumonia, although some degree of underlying congestive heart failure may also be present. Electronically Signed   By: Toribio Aye M.D.   On: 09/05/2023 07:43   US  EKG SITE RITE Result Date: 09/04/2023 If Site Rite image not attached, placement could not be confirmed due to current cardiac rhythm.   ROS Blood pressure (!) 111/55, pulse 80, temperature 98.5 F (36.9 C), temperature source Oral, resp. rate 18, height 6' 0.99 (1.854 m), weight 107.8 kg, SpO2 99%. Physical Exam HENT:     Head:     Comments: He is intubated and the stoma is essentially closed. No erythema or bleeding.        Assessment/Plan: Trach dependent- I was asked to see patient for larger trach but the family wants to be out of here. The sister does not want anything done now so I will wait for the family to iron out the the grievances and concerns for transfer to another place.   Norleen Notice 09/06/2023, 9:55 AM

## 2023-09-06 NOTE — Progress Notes (Signed)
 Patient ID: Joshua Eaton, male   DOB: 10-18-1955, 68 y.o.   MRN: [id number]    Progress Note from the Palliative Medicine Team at [facility]   Patient Name: Joshua Eaton        Date: 09/06/2023 DOB: 09-Dec-1955  Age: 67 y.o. MRN#: [id number] Attending Physician: Joshua Toribio BROCKS, MD Primary Care Physician: Joshua Corean Pollen, MD Admit Date: 08/13/2023   Reason for Consultation/Follow-up   Establishing Goals of Care   HPI/ Brief Hospital Review  Joshua Eaton is a 68 yo M w/ pertinent PMH chronic respiratory failure w/ tracheostomy/[facility], Afib on eliquis  presented to [facility] ED on 12/13 w/ GIB.   Patient recently admitted w/ respiratory failure and pna in October 2024 requiring tracheostomy. [facility] was [facility]. Later that day had an accidental trach dislodgement. His WOB continued to increase requiring re-admission to [facility] on 10/31 and was intubated and later trached during hospital admission. [facility].   Significant Hospital events per CCM notes   On 12/13 patient admitted to [facility] w/ drop in hgb and concern for GIB. GI consulted for possible endoscopy.   Patient underwent EGD on 08/15/2023 showing duodenitis and mucosal changes in the gastric antrum concerning for ulcer as well.   Bronchoscopy performed 08/16/2023 with small clots removed from distal end of trach; trach tip close proximity to membranous posterior wall  08/17/2023 initial palliative care consult  08/29/23 adjustable Bivona placed  08/30/2023 bedside bronchoscopy  08/31/2023 decompensation with difficulty to ventilate, patient was sedated and trach repositioned  1/1 follow up for  biovona adjustable trach, still could not achieve optimal positioning with distal tip at posterior wall.  Ultimately required #6 proximal XLT Brovona changed trach to 6 prox xlt. Excellent mid-line positioning. Improved gas exchange.   1/2 was  doing well. NMB discontinued. CXR was looking better. We were escalating diuretics. Had sudden desaturation, Nursing reports large plugs removed but remained hypoxic. RT was NOT able to pass suction cath when they arrived to assist. Also not able to provide manual BMV.  This progressed to bradycardic and then PEA cardiac arrest. Had 1 round ~ 3 minutes CPR. During this time Trach removed (as could not pass) intubated w/ # 7.5 ETT from above.   1/3  Weaning pressors. No events overnight   1/4 Dr. Geronimo spoke to Dr Maximino of CCM at [facility]- says no beds in [place] or [place] and declined transfer + lateral transfer -> they also called back later and said they are removing Joshua Eaton from their list and for us  to call back in  >1 weeks to see if bed available but they are not considering Joshua Eaton for acceptance   1/5 No acute issues overnight, remains on fent and prop but off pressors.   Family face ongoing treatment option decisions, advanced directive decisions and anticipatory care needs.   Subjective  Extensive chart review has been completed prior to meeting with patient/family  including labs, vital signs, imaging, progress/consult notes, orders, medications and available advance directive documents.   This NP assessed patient at the bedside as a follow up to  yesterday's GOCs meeting.   Patient's wife at bedside.  Ongoing conversation regarding current medical situation specific to acute on chronic respiratory failure and vent dependency. Overall failure to thrive, high risk for frequent  infections, high risk for decompensation.   Wife's main request today is for an ENT consult and ongoing  request for transfer  ENT/Dr. Aron to bedside for consult.   /Patient's sister  Joshua Eaton was able to be on the phone  for ENT consult .   See  Dr. Dia consult note  Discussed with CCM, team continues to make attempts to transfer at family request.  Discussed with patient's  [name]; she has the name of 2 physicians at [facility] that she believes would except the patient for transfer.  I encouraged her to speak with them directly and if that is indeed the case, I feel strongly that CCM will make every attempt to make transfer a reality.  Emotional support.  This is  very difficult  for family.   Family remain open to all offered and available medical interventions to prolong life.  They are hopeful for improvement   PMT will continue to support holistically  Questions and concerns addressed   Discussed with primary team/nursing staff   Time:   50  minutes minutes  Detailed review of medical records ( labs, imaging, vital signs), medically appropriate exam ( MS, skin, cardiac,  resp)   discussed with treatment team, counseling and education to patient, family, staff, documenting clinical information, medication management, coordination of care    Ronal Plants NP  Palliative Medicine Team Team Phone # [phone] Pager [phone]

## 2023-09-07 ENCOUNTER — Inpatient Hospital Stay (HOSPITAL_COMMUNITY): Payer: Medicare Other

## 2023-09-07 DIAGNOSIS — J9601 Acute respiratory failure with hypoxia: Secondary | ICD-10-CM | POA: Diagnosis not present

## 2023-09-07 LAB — GLUCOSE, CAPILLARY
Glucose-Capillary: 122 mg/dL — ABNORMAL HIGH (ref 70–99)
Glucose-Capillary: 113 mg/dL — ABNORMAL HIGH (ref 70–99)
Glucose-Capillary: 109 mg/dL — ABNORMAL HIGH (ref 70–99)
Glucose-Capillary: 99 mg/dL (ref 70–99)
Glucose-Capillary: 114 mg/dL — ABNORMAL HIGH (ref 70–99)
Glucose-Capillary: 117 mg/dL — ABNORMAL HIGH (ref 70–99)

## 2023-09-07 LAB — RENAL FUNCTION PANEL
Albumin: 2.1 g/dL — ABNORMAL LOW (ref 3.5–5.0)
Anion gap: 12 (ref 5–15)
BUN: 63 mg/dL — ABNORMAL HIGH (ref 8–23)
CO2: 35 mmol/L — ABNORMAL HIGH (ref 22–32)
Calcium: 8.9 mg/dL (ref 8.9–10.3)
Chloride: 95 mmol/L — ABNORMAL LOW (ref 98–111)
Creatinine, Ser: 1.55 mg/dL — ABNORMAL HIGH (ref 0.61–1.24)
GFR, Estimated: 48 mL/min — ABNORMAL LOW (ref >60)
Glucose, Bld: 131 mg/dL — ABNORMAL HIGH (ref 70–99)
Phosphorus: 6.2 mg/dL — ABNORMAL HIGH (ref 2.5–4.6)
Potassium: 4.3 mmol/L (ref 3.5–5.1)
Sodium: 142 mmol/L (ref 135–145)

## 2023-09-07 LAB — CBC
HCT: 20.6 % — ABNORMAL LOW (ref 39.0–52.0)
Hemoglobin: 6.3 g/dL — CL (ref 13.0–17.0)
MCH: 29.9 pg (ref 26.0–34.0)
MCHC: 30.6 g/dL (ref 30.0–36.0)
MCV: 97.6 fL (ref 80.0–100.0)
Platelets: 74 10*3/uL — ABNORMAL LOW (ref 150–400)
RBC: 2.11 MIL/uL — ABNORMAL LOW (ref 4.22–5.81)
RDW: 17.2 % — ABNORMAL HIGH (ref 11.5–15.5)
WBC: 9.8 10*3/uL (ref 4.0–10.5)
nRBC: 0 % (ref 0.0–0.2)

## 2023-09-07 LAB — HEMOGLOBIN AND HEMATOCRIT, BLOOD
HCT: 22.6 % — ABNORMAL LOW (ref 39.0–52.0)
Hemoglobin: 7 g/dL — ABNORMAL LOW (ref 13.0–17.0)

## 2023-09-07 LAB — PREPARE RBC (CROSSMATCH)

## 2023-09-07 LAB — TRIGLYCERIDES: Triglycerides: 72 mg/dL (ref <150)

## 2023-09-07 MED ORDER — ALBUMIN HUMAN 25 % IV SOLN
25.0000 g | Freq: Four times a day (QID) | INTRAVENOUS | Status: AC
  Administered 2023-09-07 – 2023-09-08 (×2): 25 g via INTRAVENOUS
  Administered 2023-09-08: 12.5 g via INTRAVENOUS
  Administered 2023-09-08: 25 g via INTRAVENOUS
  Filled 2023-09-07 (×4): qty 100

## 2023-09-07 MED ORDER — JEVITY 1.5 CAL/FIBER PO LIQD
1000.0000 mL | ORAL | Status: DC
  Administered 2023-09-07: 1000 mL
  Filled 2023-09-07 (×2): qty 1000

## 2023-09-07 MED ORDER — QUETIAPINE FUMARATE 25 MG PO TABS
25.0000 mg | ORAL_TABLET | Freq: Three times a day (TID) | ORAL | Status: DC
  Administered 2023-09-07 – 2023-09-08 (×4): 25 mg
  Filled 2023-09-07 (×4): qty 1

## 2023-09-07 MED ORDER — SODIUM CHLORIDE 0.9 % IV SOLN
2.0000 g | Freq: Two times a day (BID) | INTRAVENOUS | Status: DC
  Administered 2023-09-07 – 2023-09-09 (×4): 2 g via INTRAVENOUS
  Filled 2023-09-07 (×4): qty 12.5

## 2023-09-07 MED ORDER — SODIUM CHLORIDE 0.9% IV SOLUTION: Freq: Once | INTRAVENOUS | Status: AC

## 2023-09-07 MED ORDER — PROSOURCE TF20 ENFIT COMPATIBL EN LIQD
60.0000 mL | Freq: Three times a day (TID) | ENTERAL | Status: DC
  Administered 2023-09-07 – 2023-09-08 (×4): 60 mL
  Filled 2023-09-07 (×4): qty 60

## 2023-09-07 MED ORDER — SODIUM CHLORIDE 0.9% IV SOLUTION
Freq: Once | INTRAVENOUS | Status: AC
Start: 2023-09-07 — End: 2023-09-07

## 2023-09-07 NOTE — Progress Notes (Signed)
 NAME:  Joshua Eaton, MRN:  [id number], DOB:  [birth year], LOS: 24 ADMISSION DATE:  08/13/2023, CONSULTATION DATE:  12/13 REFERRING MD:  Dr. Yolande, CHIEF COMPLAINT:  GIB   History of Present Illness:  Patient is a 68 yo M w/ pertinent PMH chronic respiratory failure w/ [facility], Afib on eliquis  presents to [facility] ED on 12/13 w/ GIB.  Patient recently admitted w/ respiratory failure and pna in October 2024 requiring tracheostomy. [facility] was [facility]. Later that day had an accidental trach dislodgement. His wob continued to increase requiring re-admission to [facility] on 10/31 and was intubated and later trached during hospital admission. [facility].   On 12/13 patient admitted to [facility] w/ drop in hgb and concern for GIB. GI consulted for possible endoscopy. TRH to admit and PCCM to consult for vent management.  Pertinent  Medical History   Past Medical History:  Diagnosis Date   Anxiety    Cancer (HCC)    Depression    Hypertension    OCD (obsessive compulsive disorder)    Significant Hospital Events: Including procedures, antibiotic start and stop dates in addition to other pertinent events   12/13 admitted w/ GIB. Pccm to consult for vent management 12/14 1 unit pRBC 12/15 EGD - erosion around PEG site but no active bleeding; cratered ulcer biopsied near peg site.  12/16 plugging with clots- posterior wall bleeding from distal end of trach. Hemoptysis secondary to tracheostomy malpositioning.  This is resolved. Patient had originally been on IV heparin  for DVT.  Had tracheostomy in place, and developed fairly significant hemoptysis back on 12/16.  Bronchoscopy identified the distal tip of the ostomy abutting the posterior wall of the trachea with small amount of ulceration.  He received TXA, heavy sedation, anticoagulation was held. 12/23 family fired pccm 12/24 pccm asked to re-engage  by ENT via RN staff .  12/29 # 7 adjustable length cuffed biovona placed set at 110 mm wife updated  12/30 Bedside bronchoscopy with BAL of RML 12/31 Worsening status with difficulty to ventilate, sedated, paralyzed, trach repositioned.  Started meropenem  for Serratia HAP 1/1 follow up for  biovona adjustable trach, still could not achieve optimal positioning with distal tip at posterior wall.  Ultimately required #6 proximal XLT Brovona changed trach to 6 prox xlt. Excellent mid-line positioning. Improved gas exchange.  1/2 was doing well. NMB discontinued. CXR was looking better. We were escalating diuretics. Had sudden desaturation, Nursing reports large plugs removed but remained hypoxic. RT was NOT able to pass suction cath when they arrived to assist. Also not able to provide manual BMV.  This progressed to bradycardic and then PEA cardiac arrest. Had 1 round ~ 3 minutes CPR. During this time Trach removed (as could not pass) intubated w/ # 7.5 ETT from above.  1/3  Weaning pressors. No events overnight  1/4 Dr. Geronimo spoke to Dr Joshua Eaton of CCM at [facility]- says no beds in [place] and declined transfer + lateral transfer -> they also called back later and said they are removing Joshua Eaton from their list and for us  to call back in  >1 weeks to see if bed available but they are not considering Joshua Eaton for acceptance  1/5 No acute issues overnight, remains on fent and prop but off pressors.   Interim History / Subjective:  Intubated on mech vent Sedate w/ prop/fent  UOP 630 last 24 hours;  creat bumped to 1.55 today from 1.06 yesterday  Objective   Blood pressure (!) 110/50, pulse 75, temperature 98.6 F (37 C), temperature source Axillary, resp. rate (!) 22, height 6' 0.99 (1.854 m), weight 109.6 kg, SpO2 95%.    Vent Mode: PRVC FiO2 (%):  [60 %-80 %] 60 % Set Rate:  [22 bmp] 22 bmp Vt Set:  [480 mL] 480 mL PEEP:  [8 cmH20] 8 cmH20 Plateau Pressure:  [22  cmH20-32 cmH20] 22 cmH20   Intake/Output Summary (Last 24 hours) at 09/07/2023 0745 Last data filed at 09/07/2023 0700 Gross per 24 hour  Intake 2372.51 ml  Output 1780 ml  Net 592.51 ml   Filed Weights   09/05/23 0417 09/06/23 0500 09/07/23 0233  Weight: 107.5 kg 107.8 kg 109.6 kg    Examination: General: critically ill appearing on mech vent HEENT: MM pink/moist; ETT in place Neuro: sedate; perrl CV: s1s2, RRR, no m/r/g PULM:  dim rhonchi BS bilaterally; on mech vent PRVC GI: soft, bsx4 active  Extremities: warm/dry, diffuse anasarca  Resolved Hospital Problem list   Hypokalemia HTN AKI Hemoptysis (resolved) secondary to sxn trauma  Hyperbilirubinemia Petechial rash resolved Rash on knee-- treated for  ringworm w/ antifungal X 2 weeks Post cardiac arrest shock. 1/2  -Cardiac arrest precipitated by airway obstruction, ? mucous plugging, with residual hypotension 2/2 sedating meds  Assessment & Plan:  Acute on chronic hypoxic and hypercarbic respiratory failure with trach/vent dependence since 06/2023 Serratia HAP w/ ARDS  Tracheomalacia  -Largely driven by severe neuromuscular weakness but also due to intermittent infections, pneumonias. Currently with Serratia.  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  Family requesting transfer to tertiary facility -Denied at multiple medical centers  Paroxysmal Afib Fluid overload P: -given lasix  yesterday; UOP  only 630 and creat bumped to 1.55 from 1.06; will likely hold on diuresis today -off AC w/ hx of GIB; CBC stable; when hgb stable could consider starting heparin  gtt -amio  AKI Plan: -given lasix  yesterday; UOP only 630 and creat bumped to 1.55 from 1.06 -Trend BMP / urinary output -Replace electrolytes as indicated -Avoid nephrotoxic agents, ensure adequate renal perfusion   ABLA 2/2 UGIB/ duodenitis, now more anemia of critical illness Thrombocytopenia, stable, suspected 2/2 sepsis - off AC since 12/5 P: -PPI bid -trend cbc -transfuse for hgb <7   LUE provoked DVT from picc line Difficult IV access  -PICC line was removed back on 12/14 from the left upper extremity.  US  neg for RUE DVT Remains off systemic AC given prior bleeding issues.  Avoid PICC in LUE.  CVL since 12/14, BC neg from 12/23. P: -supportive care -off systemic AC w/ hx of GIB; check cbc today; could consider starting heparin  gtt if hgb stable  Acute metabolic encephalopathy superimposed on history of anxiety P: -limit sedating meds -wean sedation as able -cont seroquel  and zoloft    Unstagable pressure ulcer on sacrum POA P: -WOC following; plan for hydrotherapy   Moderate protein calorie malnutrition  P: -  cont TF  Ring worm left knee -Had two weeks of rx. Better but not resolved P: -cont lamicil   Mild hyperglycemia P: -ssi and cbg monitoring  Severe deconditioning  P: -PT/OT/SLP when appropriate  Diarrhea - improved  P: -fiber  Discharge planning -remains difficult situation.  Ultimately needs surgery for his valvular disease however his degree of deconditioning, malnutrition, and ventilator dependence continue to be a barrier.  Not a surgical candidate back in October and certainly not now.  Will need extensive rehabilitation efforts and unclear if liberation from mechanical ventilation is an option.  Family continue to request transfer.  Denied 1/2 by [facility] and [facility].  Remains on wait list  for [facility]. Denied at [facility].  I do not think care plan will change regardless of location.   Best Practice (right click and Reselect all SmartList Selections daily)   Diet/type: tubefeeds DVT prophylaxis LMWH Pressure ulcer(s): present on admission  GI prophylaxis: PPI Lines: Central line Foley:  Yes, and it is still needed Code Status:  full code Last date of multidisciplinary goals of care discussion Wife updated at bedside 1/7  Critical care time: 35 minutes    JD Emilio RIGGERS [facility] 09/07/2023, 8:44 AM  Please see [email or web address] for pager details.  From 7A-7P if no response, please call [phone]. After hours, please call ELink [phone].

## 2023-09-07 NOTE — Progress Notes (Signed)
 Nutrition Follow-up  DOCUMENTATION CODES:   Non-severe (moderate) malnutrition in context of chronic illness  INTERVENTION:  Continue TF via PEG: Decrease Jevity 1.5 to rate of 11ml/hr ( per day) Increase 60ml ProSource TF20 to TID Provides 1860 kcal, 129g protein, and free water  (With propofol  @17 .20ml/hr provides additional 464 kcal per day- 2324 total kcal)   Continue Vitamin C  250mg  BID to support wound healing   Banatrol BID-provides 45kcal, 5g soluble fiber and 2g protein per serving. Consider increasing on follow up if loose stools continue   NUTRITION DIAGNOSIS:   Moderate Malnutrition related to chronic illness as evidenced by severe muscle depletion, moderate muscle depletion, mild fat depletion. - remains applicable  GOAL:   Patient will meet greater than or equal to 90% of their needs - goal met via TF  MONITOR:   Vent status, TF tolerance, Skin  REASON FOR ASSESSMENT:   Consult Enteral/tube feeding initiation and management  ASSESSMENT:   68 yo male admitted with possible GI Bleed from [facility]; pt with hx of chronic respiratory failure requiring chronic trach/vent. Noted pt with prolonged hospital stay on October for pneumonia, severe MR, pulmonary edema requiring trach/PEG placement. PMH includes HTN, depression/amxiety, OCD  12/15 - EGD showed ulcer around PEG site but no active bleeding  12/16 bronchoscopy- plugging with clots- posterior wall bleeding from distal end of trach 12/30 - mucous plugging; s/p bronchoscopy  01/02 - sudden desaturation; large mucus plug removed; PEA arrest; trach removed; intubated via ETT  PT assessed for hydrotherapy of wounds today. Determined hydrotherapy not to be beneficial d/t inaccessible necrotic tissue.   Discussed in rounds.  Family continues desire for transfer to another hospital.  Remains stable on current TF regimen.  Spoke with RN regarding BM's. Reports that stools have been more formed and  consistent therefore have been holding banatrol. Desire for slightly looser stools to allow for ease of movement through FMS.   Pt remains on propofol  with consistent rate (17.40ml/hr). Discussed with PA. Plans to continue for now until new trach placed. Will adjust TF regimen slightly to account for kcal from propofol .   Patient remains intubated on ventilator support MV: 10.7 L/min Temp (24hrs), Avg:97.9 F (36.6 C), Min:96.3 F (35.7 C), Max:98.6 F (37 C)  Admit weight: 86.6 kg Current weight: 109.6 kg + edema: moderate pitting BUE, BLE; deep pitting perineal  UOP: x24 hours I/O's: +8.2L since 12/24  Drains/lines: FMS  Medications: vitamin C  250mg  BID, SSI 0-15 units q4h, protonix   Drips: Abx Propofol  @ 17.6ml/hr (provides 464 kcal per day at current rate)  Labs: BUN 63 Cr 1.55 Phos 6.2 GFR 48 CBG's 113-139 x24 hours  Diet Order:   Diet Order             Diet NPO time specified  Diet effective now                   EDUCATION NEEDS:   Not appropriate for education at this time  Skin:  Skin Integrity Issues:: Unstageable Unstageable: sacrum Other: round rash on L knee; Petechial rash on hands and fee  Last BM:  x24 hours via FMS  Height:   Ht Readings from Last 1 Encounters:  08/18/23 6' 0.99 (1.854 m)    Weight:   Wt Readings from Last 1 Encounters:  09/07/23 109.6 kg   BMI:  Body mass index is 31.89 kg/m.  Estimated Nutritional Needs:   Kcal:  2200-2400 kcals  Protein:  125-150 g  Fluid:  >/= 2L  Allie Cherese Lozano, RDN, LDN Clinical Nutrition

## 2023-09-07 NOTE — Progress Notes (Signed)
 09/07/2023  Long talk with family; grievances heard and addressed.  We are now all aligned on care plan and understand transfer is not possible at this time.  Plan as follows: CT A/P this will allow RP bleed r/o and evaluation of sacral wound; GenSurg eval Redo trach with family at bedside 14:00 by me Sister requests call from RD Sister requests call from [facility] Eventual need to wean enteral antipsychotics as family is concerned regarding AKI risk Longer term need to figure out if he can ever tolerate AC  Going to be a long road ahead and family requests daily detailed updates.  Rolan Sharps MD PCCM

## 2023-09-07 NOTE — Progress Notes (Addendum)
 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 Dr. Arvil spoke with me and has rejected patient for transfer. Sister Rexene updated over phone.  JD Emilio RIGGERS [facility] 09/07/2023, 4:42 PM  Please see [email or web address] for pager details.  From 7A-7P if no response, please call [phone]. After hours, please call ELink [phone].

## 2023-09-07 NOTE — Progress Notes (Signed)
 Notified by nurse that hgb 6.3. Will transfuse 1 unit PRBC and hold lovenox .  RODGER Emilio RIGGERS [facility] 09/07/2023, 10:42 AM  Please see [email or web address] for pager details.  From 7A-7P if no response, please call [phone]. After hours, please call ELink [phone].

## 2023-09-07 NOTE — Progress Notes (Signed)
 Physical Therapy Wound Treatment and Discharge Patient Details  Name: Joshua Eaton MRN: [id number] Date of Birth: 01-Aug-1956  Today's Date: 09/07/2023 Time: 8884-8848 Time Calculation (min): 36 min  Subjective  Subjective Assessment Subjective: intubated and sedated Patient and Family Stated Goals: no family present Date of Onset:  (unknown) Prior Treatments: medihoney; previous debridement by plastics  Pain Score:   sedated on ventilator with no signs of pain  Clinical Statement: Patient again referred to hydrotherapy due to non-healing sacral wound. Currently 30% necrotic tissue (yellow and brown) and with 10% exposed bone. Difficult to debride area due to depth of wound with poor visibility and access to area (even with use of headlamp). Do not feel hydrotherapy can be of benefit at tihis time due to inaccessible necrotic tissue which is very adherent with inability to grasp/remove tissue with current sharp instruments. Will notify WOC RN re: discharge from hydrotherapy.  Wound Assessment  Pressure Injury 08/14/23 Sacrum Posterior;Medial Unstageable - Full thickness tissue loss in which the base of the injury is covered by slough (yellow, tan, gray, green or brown) and/or eschar (tan, brown or black) in the wound bed. (Active)  Wound Image   09/07/23 1157  Dressing Type Foam - Lift dressing to assess site every shift;Gauze (Comment);Honey 09/07/23 1157  Dressing Changed 09/07/23 1157  Dressing Change Frequency Twice a day 09/07/23 1157  State of Healing Early/partial granulation 09/07/23 1157  Site / Wound Assessment Bleeding;Brown;Granulation tissue;Red;Yellow 09/07/23 1157  % Wound base Red or Granulating 60% 09/07/23 1157  % Wound base Yellow/Fibrinous Exudate 20% 09/07/23 1157  % Wound base Black/Eschar 10% 09/07/23 1157  % Wound base Other/Granulation Tissue (Comment) 10% 09/07/23 1157  Peri-wound Assessment Erythema (non-blanchable);Purple 09/07/23 1157  Wound Length (cm) 6.8 cm  09/07/23 1157  Wound Width (cm) 4.7 cm 09/07/23 1157  Wound Depth (cm) 3 cm 09/07/23 1157  Wound Surface Area (cm^2) 31.96 cm^2 09/07/23 1157  Wound Volume (cm^3) 95.88 cm^3 09/07/23 1157  Margins Unattached edges (unapproximated) 09/07/23 1157  Drainage Amount Scant 09/07/23 1157  Drainage Description Serosanguineous 09/07/23 1157  Treatment Debridement (Selective);Irrigation 09/07/23 1157   Selective Debridement (non-excisional) Selective Debridement (non-excisional) - Location: sacrum Selective Debridement (non-excisional) - Tools Used: Forceps, Scalpel Selective Debridement (non-excisional) - Tissue Removed: yellow slough; brown tissue too adherent to grasp/remove    Wound Assessment and Plan  Wound Therapy - Assess/Plan/Recommendations Wound Therapy - Clinical Statement: Patient again referred to hydrotherapy due to non-healing sacral wound. Currently 30% necrotic tissue (yellow and brown) and with 10% exposed bone. Difficult to debride area due to depth of wound with poor visibility and access to area (even with use of headlamp). Do not feel hydrotherapy can be of benefit at tihis time due to inaccessible necrotic tissue which is very adherent with inability to grasp/remove tissue with current sharp instruments. Will notify WOC RN re: discharge from hydrotherapy. Wound Therapy - Functional Problem List: weak with limited mobility Factors Delaying/Impairing Wound Healing: Incontinence, Immobility, Multiple medical problems Hydrotherapy Plan: Other (comment) (discharge) Wound Therapy - Frequency: 2X / week (Tues, Friday) Wound Therapy - Follow Up Recommendations: dressing changes by RN  Wound Therapy Goals- Improve the function of patient's integumentary system by progressing the wound(s) through the phases of wound healing (inflammation - proliferation - remodeling) by: Wound Therapy Goals - Improve the function of patient's integumentary system by progressing the wound(s) through the  phases of wound healing by: Decrease Necrotic Tissue to: 15% Decrease Necrotic Tissue - Progress: Met Increase Granulation Tissue to: 85%  Increase Granulation Tissue - Progress: Met Decrease Length/Width/Depth by (cm): --/--/0.3 Decrease Length/Width/Depth - Progress: Partly met Improve Drainage Characteristics: Min Improve Drainage Characteristics - Progress: Partly met Goals/treatment plan/discharge plan were made with and agreed upon by patient/family: No, Patient unable to participate in goals/treatment/discharge plan and family unavailable Time For Goal Achievement: 7 days Wound Therapy - Potential for Goals: Good  Goals will be updated until maximal potential achieved or discharge criteria met.  Discharge criteria: when goals achieved, discharge from hospital, MD decision/surgical intervention, no progress towards goals, refusal/missing three consecutive treatments without notification or medical reason.  GP     Charges PT Wound Care Charges $Wound Debridement up to 20 cm: < or equal to 20 cm $PT Hydrotherapy Visit: 1 Visit      Macario RAMAN, PT Acute Rehabilitation Services  Office [phone]   Macario SHAUNNA Soja 09/07/2023, 12:11 PM

## 2023-09-08 ENCOUNTER — Inpatient Hospital Stay (HOSPITAL_COMMUNITY): Payer: Medicare Other

## 2023-09-08 DIAGNOSIS — J9601 Acute respiratory failure with hypoxia: Secondary | ICD-10-CM | POA: Diagnosis not present

## 2023-09-08 LAB — CBC
HCT: 25.6 % — ABNORMAL LOW (ref 39.0–52.0)
Hemoglobin: 8 g/dL — ABNORMAL LOW (ref 13.0–17.0)
MCH: 29.7 pg (ref 26.0–34.0)
MCHC: 31.3 g/dL (ref 30.0–36.0)
MCV: 95.2 fL (ref 80.0–100.0)
Platelets: 74 10*3/uL — ABNORMAL LOW (ref 150–400)
RBC: 2.69 MIL/uL — ABNORMAL LOW (ref 4.22–5.81)
RDW: 17.7 % — ABNORMAL HIGH (ref 11.5–15.5)
WBC: 9.8 10*3/uL (ref 4.0–10.5)
nRBC: 0 % (ref 0.0–0.2)

## 2023-09-08 LAB — POCT I-STAT 7, (LYTES, BLD GAS, ICA,H+H)
Acid-Base Excess: 10 mmol/L — ABNORMAL HIGH (ref 0.0–2.0)
Acid-Base Excess: 10 mmol/L — ABNORMAL HIGH (ref 0.0–2.0)
Bicarbonate: 40.8 mmol/L — ABNORMAL HIGH (ref 20.0–28.0)
Bicarbonate: 39.6 mmol/L — ABNORMAL HIGH (ref 20.0–28.0)
Calcium, Ion: 1.2 mmol/L (ref 1.15–1.40)
Calcium, Ion: 1.19 mmol/L (ref 1.15–1.40)
HCT: 25 % — ABNORMAL LOW (ref 39.0–52.0)
HCT: 26 % — ABNORMAL LOW (ref 39.0–52.0)
Hemoglobin: 8.5 g/dL — ABNORMAL LOW (ref 13.0–17.0)
Hemoglobin: 8.8 g/dL — ABNORMAL LOW (ref 13.0–17.0)
O2 Saturation: 96 %
O2 Saturation: 90 %
Patient temperature: 98.6
Potassium: 5 mmol/L (ref 3.5–5.1)
Potassium: 4.9 mmol/L (ref 3.5–5.1)
Sodium: 140 mmol/L (ref 135–145)
Sodium: 142 mmol/L (ref 135–145)
TCO2: 44 mmol/L — ABNORMAL HIGH (ref 22–32)
TCO2: 42 mmol/L — ABNORMAL HIGH (ref 22–32)
pCO2 arterial: 106.6 mm[Hg] (ref 32–48)
pCO2 arterial: 93.5 mm[Hg] (ref 32–48)
pH, Arterial: 7.192 — CL (ref 7.35–7.45)
pH, Arterial: 7.235 — ABNORMAL LOW (ref 7.35–7.45)
pO2, Arterial: 112 mm[Hg] — ABNORMAL HIGH (ref 83–108)
pO2, Arterial: 75 mm[Hg] — ABNORMAL LOW (ref 83–108)

## 2023-09-08 LAB — TYPE AND SCREEN
ABO/RH(D): O POS
Antibody Screen: NEGATIVE
Unit division: 0
Unit division: 0

## 2023-09-08 LAB — BPAM RBC
Blood Product Expiration Date: 202502022359
Blood Product Expiration Date: 202502032359
ISSUE DATE / TIME: 202501071321
ISSUE DATE / TIME: 202501072029
Unit Type and Rh: 5100
Unit Type and Rh: 5100

## 2023-09-08 LAB — RENAL FUNCTION PANEL
Albumin: 2.5 g/dL — ABNORMAL LOW (ref 3.5–5.0)
Anion gap: 12 (ref 5–15)
BUN: 73 mg/dL — ABNORMAL HIGH (ref 8–23)
CO2: 32 mmol/L (ref 22–32)
Calcium: 8.8 mg/dL — ABNORMAL LOW (ref 8.9–10.3)
Chloride: 97 mmol/L — ABNORMAL LOW (ref 98–111)
Creatinine, Ser: 2.22 mg/dL — ABNORMAL HIGH (ref 0.61–1.24)
GFR, Estimated: 31 mL/min — ABNORMAL LOW
Glucose, Bld: 120 mg/dL — ABNORMAL HIGH (ref 70–99)
Phosphorus: 8.3 mg/dL — ABNORMAL HIGH (ref 2.5–4.6)
Potassium: 4.6 mmol/L (ref 3.5–5.1)
Sodium: 141 mmol/L (ref 135–145)

## 2023-09-08 LAB — RETICULOCYTES
Immature Retic Fract: 17 % — ABNORMAL HIGH (ref 2.3–15.9)
RBC.: 2.63 MIL/uL — ABNORMAL LOW (ref 4.22–5.81)
Retic Count, Absolute: 59.7 10*3/uL (ref 19.0–186.0)
Retic Ct Pct: 2.3 % (ref 0.4–3.1)

## 2023-09-08 LAB — IRON AND TIBC
Iron: 40 ug/dL — ABNORMAL LOW (ref 45–182)
Saturation Ratios: 24 % (ref 17.9–39.5)
TIBC: 168 ug/dL — ABNORMAL LOW (ref 250–450)
UIBC: 128 ug/dL

## 2023-09-08 LAB — GLUCOSE, CAPILLARY
Glucose-Capillary: 97 mg/dL (ref 70–99)
Glucose-Capillary: 121 mg/dL — ABNORMAL HIGH (ref 70–99)
Glucose-Capillary: 114 mg/dL — ABNORMAL HIGH (ref 70–99)
Glucose-Capillary: 119 mg/dL — ABNORMAL HIGH (ref 70–99)
Glucose-Capillary: 125 mg/dL — ABNORMAL HIGH (ref 70–99)
Glucose-Capillary: 120 mg/dL — ABNORMAL HIGH (ref 70–99)

## 2023-09-08 LAB — VITAMIN B12: Vitamin B-12: 802 pg/mL (ref 180–914)

## 2023-09-08 LAB — FERRITIN: Ferritin: 2200 ng/mL — ABNORMAL HIGH (ref 24–336)

## 2023-09-08 LAB — FOLATE: Folate: 9.9 ng/mL (ref >5.9)

## 2023-09-08 MED ORDER — NOREPINEPHRINE 4 MG/250ML-% IV SOLN
INTRAVENOUS | Status: AC
  Administered 2023-09-08: 5 ug/min via INTRAVENOUS
  Filled 2023-09-08: qty 250

## 2023-09-08 MED ORDER — SERTRALINE HCL 50 MG PO TABS
50.0000 mg | ORAL_TABLET | Freq: Every day | ORAL | Status: DC
  Administered 2023-09-08: 50 mg
  Filled 2023-09-08: qty 1

## 2023-09-08 MED ORDER — PROSOURCE TF20 ENFIT COMPATIBL EN LIQD
60.0000 mL | Freq: Two times a day (BID) | ENTERAL | Status: DC
  Administered 2023-09-09 – 2023-09-10 (×3): 60 mL
  Filled 2023-09-08 (×3): qty 60

## 2023-09-08 MED ORDER — SODIUM BICARBONATE 8.4 % IV SOLN
INTRAVENOUS | Status: AC
  Administered 2023-09-08: 100 meq via INTRAVENOUS
  Filled 2023-09-08: qty 100

## 2023-09-08 MED ORDER — ETOMIDATE 2 MG/ML IV SOLN
20.0000 mg | Freq: Once | INTRAVENOUS | Status: AC
Start: 2023-09-08 — End: 2023-09-08
  Filled 2023-09-08: qty 10

## 2023-09-08 MED ORDER — ROCURONIUM BROMIDE 10 MG/ML (PF) SYRINGE
PREFILLED_SYRINGE | INTRAVENOUS | Status: AC
  Administered 2023-09-08: 100 mg via INTRAVENOUS
  Filled 2023-09-08: qty 10

## 2023-09-08 MED ORDER — FENTANYL CITRATE PF 50 MCG/ML IJ SOSY
200.0000 ug | PREFILLED_SYRINGE | Freq: Once | INTRAMUSCULAR | Status: AC
Start: 2023-09-08 — End: 2023-09-08
  Administered 2023-09-08: 200 ug via INTRAVENOUS
  Filled 2023-09-08: qty 4

## 2023-09-08 MED ORDER — SODIUM BICARBONATE 8.4 % IV SOLN
100.0000 meq | Freq: Once | INTRAVENOUS | Status: AC
  Administered 2023-09-08: 100 meq via INTRAVENOUS
  Filled 2023-09-08: qty 100

## 2023-09-08 MED ORDER — ROCURONIUM BROMIDE 10 MG/ML (PF) SYRINGE: 100.0000 mg | PREFILLED_SYRINGE | Freq: Once | INTRAVENOUS | Status: AC

## 2023-09-08 MED ORDER — SODIUM CHLORIDE 0.9 % IV SOLN
10.0000 ug | Freq: Once | INTRAVENOUS | Status: AC
  Administered 2023-09-08: 10 ug via INTRAVENOUS
  Filled 2023-09-08: qty 2.5

## 2023-09-08 MED ORDER — SODIUM BICARBONATE 8.4 % IV SOLN: 100.0000 meq | Freq: Once | INTRAVENOUS | Status: AC

## 2023-09-08 MED ORDER — LACTATED RINGERS IV SOLN
INTRAVENOUS | Status: AC
Start: 2023-09-08 — End: 2023-09-09

## 2023-09-08 MED ORDER — MIDAZOLAM HCL 2 MG/2ML IJ SOLN
5.0000 mg | Freq: Once | INTRAMUSCULAR | Status: AC
  Administered 2023-09-08: 2 mg via INTRAVENOUS
  Filled 2023-09-08: qty 6

## 2023-09-08 MED ORDER — LIDOCAINE-EPINEPHRINE 1 %-1:100000 IJ SOLN
20.0000 mL | Freq: Once | INTRAMUSCULAR | Status: AC
  Administered 2023-09-08: 20 mL via INTRADERMAL
  Filled 2023-09-08: qty 1

## 2023-09-08 MED ORDER — JEVITY 1.5 CAL/FIBER PO LIQD
237.0000 mL | Freq: Every day | ORAL | Status: DC
  Administered 2023-09-08 – 2023-09-15 (×39): 237 mL
  Filled 2023-09-08 (×44): qty 237

## 2023-09-08 MED ORDER — NOREPINEPHRINE 4 MG/250ML-% IV SOLN
0.0000 ug/min | INTRAVENOUS | Status: DC
  Administered 2023-09-10: 2 ug/min via INTRAVENOUS
  Administered 2023-09-11: 3 ug/min via INTRAVENOUS
  Filled 2023-09-08: qty 250

## 2023-09-08 MED ORDER — ALBUMIN HUMAN 25 % IV SOLN
25.0000 g | Freq: Four times a day (QID) | INTRAVENOUS | Status: AC
  Administered 2023-09-08 – 2023-09-09 (×4): 25 g via INTRAVENOUS
  Filled 2023-09-08 (×4): qty 100

## 2023-09-08 MED ORDER — ROCURONIUM BROMIDE 10 MG/ML (PF) SYRINGE
100.0000 mg | PREFILLED_SYRINGE | Freq: Once | INTRAVENOUS | Status: AC
  Administered 2023-09-08: 70 mg via INTRAVENOUS
  Filled 2023-09-08: qty 10

## 2023-09-08 NOTE — Procedures (Signed)
 Bedside Bronchoscopy Procedure Note Izaha Shughart [id number] 1956/02/16  Procedure: Bronchoscopy Indications: Diagnostic evaluation of the airways, Remove secretions, and replace trach  Procedure Details: ET Tube Size: ET Tube secured at lip (cm): Bite block in place: Yes In preparation for procedure, Patient hyper-oxygenated with 100 % FiO2 and Saline given via ETT (hub ml) Airway entered and the following bronchi were examined: RUL, RLL, LUL, and LLL.   Bronchoscope removed.  , Patient placed back on 100% FiO2 at conclusion of procedure.    Evaluation BP (!) 106/49   Pulse 73   Temp 97.8 F (36.6 C) (Axillary)   Resp (!) 22   Ht 6' 0.99 (1.854 m)   Wt 108.7 kg   SpO2 94%   BMI 31.62 kg/m  Breath Sounds:Clear and Diminished O2 sats: stable throughout Patient's Current Condition: stable Specimens:  None Complications: No apparent complications Patient did tolerate procedure well.   Auden Wettstein 09/08/2023, 4:02 PM

## 2023-09-08 NOTE — Progress Notes (Signed)
 Nutrition Follow-up  DOCUMENTATION CODES:   Non-severe (moderate) malnutrition in context of chronic illness  INTERVENTION:   Once able to resume TF via PEG, recommend transitioning to bolus regimen: 6 cartons Jevity 1.5 daily 60ml Prosource TF20 BID Provides 2290 kcal, 131g protein, free water  daily  Banatrol BID-provides 45kcal, 5g soluble fiber and 2g protein per serving.  Continue Vitamin C  250mg  BID to support wound healing  Vitamin/micronutrient labs ordered: Vitamin C , Vitamin A , zinc , Vitamin B12, anemia, CRP  NUTRITION DIAGNOSIS:   Moderate Malnutrition related to chronic illness as evidenced by severe muscle depletion, moderate muscle depletion, mild fat depletion. - remains applicable  GOAL:   Patient will meet greater than or equal to 90% of their needs - goal met via TF  MONITOR:   Vent status, TF tolerance, Skin  REASON FOR ASSESSMENT:   Consult Enteral/tube feeding initiation and management  ASSESSMENT:   68 yo male admitted with possible GI Bleed from [facility]; pt with hx of chronic respiratory failure requiring chronic trach/vent. Noted pt with prolonged hospital stay on October for pneumonia, severe MR, pulmonary edema requiring trach/PEG placement. PMH includes HTN, depression/amxiety, OCD  12/15 - EGD showed ulcer around PEG site but no active bleeding  12/16 bronchoscopy- plugging with clots- posterior wall bleeding from distal end of trach 12/30 - mucous plugging; s/p bronchoscopy  01/02 - sudden desaturation; large mucus plug removed; PEA arrest; trach removed; intubated via ETT 01/08 - plans for trach replacement  Surgery assessed pt's wounds today; review of imaging shows a sacral wound with some fibrinous exudate but no signs of necrosis, wound base appears healthy. No indication for surgical debridement at this time.   Patient remains intubated and on vent support with plans for replacement of trach today. Once trach placed,  plan to wean off propofol , therefore will not account for the kcals provided via propofol  within TF regimen.   Despite report of thickened stools via FMS during yesterdays assessment, RN today reports that stools are still fairly loose.  output documented over the last x24 hours.  Frequency of output documentation in flowchart not consistent, therefore difficult to determine objectively the quantity/frequency of stool output.   Consult received to call and speak with pt's sister, Rexene, regarding nutrition regimen and assessment of vitamin labs. Pt's wife, Morrell, at bedside. Spoke with pt's wife and sister via phone call in room. Family goal is for removal of FMS, though also with concern of stooling within sacral wound which would hinder wound healing.   Discussed with family continuing banatrol supplements to support stool formation. In addition, plan to transition to bolus feeding regimen to allow time for GI rest between feeds. Informed family that vitamin/micronutrient labs have also been ordered to assess for any deficiencies that could potentially hinder wound healing.   Admit weight: 86.6 kg Current weight: 108.7 kg UOP: x24 hours +15ml x 8 hours Net positive +11.5L since 12/25 Remains edematous. Deep pitting BUE, moderate pitting BLE  Medications: Vitamin C  250mg  BID, banatrol BID, SSI 0-15 units q4h, protonix  Drips: Albumin  Abx LR @ 40ml/hr Levo @ 5mcg/min Propofol  @ 17.69ml/hr   Labs: BUN 73 Cr 2.22 Phos 8.3 GFR 31 CBG's 97-121 x24 hours  Diet Order:   Diet Order             Diet NPO time specified  Diet effective now                   EDUCATION NEEDS:  Not appropriate for education at this time  Skin:  Skin Integrity Issues:: Unstageable Stage III: anus Unstageable: sacrum Other: round rash on L knee; Petechial rash on hands and feet--improved  Last BM:  x24 hours via FMS  Height:   Ht Readings from Last 1 Encounters:  08/18/23  6' 0.99 (1.854 m)    Weight:   Wt Readings from Last 1 Encounters:  09/08/23 108.7 kg   BMI:  Body mass index is 31.62 kg/m.  Estimated Nutritional Needs:   Kcal:  2200-2400 kcals  Protein:  125-150 g  Fluid:  >/= 2L  Royce Maris, RDN, LDN Clinical Nutrition

## 2023-09-08 NOTE — Consult Note (Signed)
 WOC Nurse Consult Note: Reason for Consult: Asked to call and speak with family member Rexene regarding wound care.  She was inquiring about possibly switching to silvadene cream.  Wound has not improved with Therahoney dressing.  PT has evaluated for appropriateness of hydrotherapy and do not feel it would benefit patient at this time  The devitalized tissue is along the edge and not accessible with the hydrotherapy instruments.  We will switch to silver hydrofiber.  This will be antimicrobial and absorb moisture, filling dead space and may promote healing.  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  Began in October, was admitted to [facility] and re-admitted to hospital  Pressure Injury POA: Yes Measurement: Sacrum 7 cm x 5 cm x 3 cm but deepest part wound bed obscured by dark, devitalized tissue.   Device related pressure injury to perirectal area.  Use of external fecal manager in place due to chronic loose stools and tubing is kept free of this open area that is distal to rectal opening. Measures 4 cm x 3 cm x 0.2 cm   Wound bed: 80% beefy red with 20% slough Drainage (amount, consistency, odor) moderate serosanguinous Periwound:intact Dressing procedure/placement/frequency: Surgery has evaluated and no surgical intervention planned. CT showed wound is free of infection/abscess  Will fill dead space and provide moist wound healing.  Cleanse sacral and perirectal wound with soap and water  and pat dry. Apply aquacel(LAWSON #[id number]) to wound bed,filling dead space with fluffed gauze if needed.  Top with dry dressing  Change daily.   Patient is on low air loss mattress.  No answer with a call to [facility]  [phone], message left regarding treatment plan at this time.  Explained that the silvadene cream is a weak silver and would not stand up to wound drainage and would need to be applied several times daily.  Aquacel is a stronger silver and will absorb drainage.  We will change daily.  Will not  follow at this time.  Please re-consult if needed.  Darice Cooley MSN, RN, FNP-BC CWON Wound, Ostomy, Continence Nurse [facility] [phone] Pager [phone]

## 2023-09-08 NOTE — Progress Notes (Signed)
 NAME:  Joshua Eaton, MRN:  [id number], DOB:  [birth year], LOS: 25 ADMISSION DATE:  08/13/2023, CONSULTATION DATE:  12/13 REFERRING MD:  Dr. Yolande, CHIEF COMPLAINT:  GIB   History of Present Illness:  Patient is a 68 yo M w/ pertinent PMH chronic respiratory failure w/ tracheostomy/[facility], Afib on eliquis  presents to [facility] ED on 12/13 w/ GIB.  Patient recently admitted w/ respiratory failure and pna in October 2024 requiring tracheostomy. [facility] was [facility]. Later that day had an accidental trach dislodgement. His wob continued to increase requiring re-admission to [facility] on 10/31 and was intubated and later trached during hospital admission. Patient discharged [facility].   On 12/13 patient admitted to [facility] w/ drop in hgb and concern for GIB. GI consulted for possible endoscopy. TRH to admit and PCCM to consult for vent management.  Pertinent  Medical History   Past Medical History:  Diagnosis Date   Anxiety    Cancer (HCC)    Depression    Hypertension    OCD (obsessive compulsive disorder)    Significant Hospital Events: Including procedures, antibiotic start and stop dates in addition to other pertinent events   12/13 admitted w/ GIB. Pccm to consult for vent management 12/14 1 unit pRBC 12/15 EGD - erosion around PEG site but no active bleeding; cratered ulcer biopsied near peg site.  12/16 plugging with clots- posterior wall bleeding from distal end of trach. Hemoptysis secondary to tracheostomy malpositioning.  This is resolved. Patient had originally been on IV heparin  for DVT.  Had tracheostomy in place, and developed fairly significant hemoptysis back on 12/16.  Bronchoscopy identified the distal tip of the ostomy abutting the posterior wall of the trachea with small amount of ulceration.  He received TXA, heavy sedation, anticoagulation was held. 12/23 family fired pccm 12/24 pccm asked to re-engage  by ENT via RN staff .  12/29 # 7 adjustable length cuffed biovona placed set at 110 mm wife updated  12/30 Bedside bronchoscopy with BAL of RML 12/31 Worsening status with difficulty to ventilate, sedated, paralyzed, trach repositioned.  Started meropenem  for Serratia HAP 1/1 follow up for  biovona adjustable trach, still could not achieve optimal positioning with distal tip at posterior wall.  Ultimately required #6 proximal XLT Brovona changed trach to 6 prox xlt. Excellent mid-line positioning. Improved gas exchange.  1/2 was doing well. NMB discontinued. CXR was looking better. We were escalating diuretics. Had sudden desaturation, Nursing reports large plugs removed but remained hypoxic. RT was NOT able to pass suction cath when they arrived to assist. Also not able to provide manual BMV.  This progressed to bradycardic and then PEA cardiac arrest. Had 1 round ~ 3 minutes CPR. During this time Trach removed (as could not pass) intubated w/ # 7.5 ETT from above.  1/3  Weaning pressors. No events overnight  1/4 Dr. Geronimo spoke to Dr Maximino of CCM at [facility]- says no beds in [place] or [place] and declined transfer + lateral transfer -> they also called back later and said they are removing [name] from their list and for us  to call back in  >1 weeks to see if bed available but they are not considering [name] for acceptance  1/5 No acute issues overnight, remains on fent and prop but off pressors.  1/7 remains intubated/sedated; drop in hgb requiring 2u PRBCs; [facility] denied transfer for tracheopexy procedure; family meeting discussed and  plan on trach tomorrow  Interim History / Subjective:  Intubated/sedate Plan for trach today UOP only 140; creat 2.22 from 1.55   Objective   Blood pressure (!) 122/55, pulse 74, temperature 97.6 F (36.4 C), temperature source Oral, resp. rate (!) 22, height 6' 0.99 (1.854 m), weight 108.7 kg, SpO2 90%.    Vent Mode: PRVC FiO2 (%):  [50  %-60 %] 60 % Set Rate:  [22 bmp] 22 bmp Vt Set:  [480 mL] 480 mL PEEP:  [8 cmH20] 8 cmH20 Plateau Pressure:  [23 cmH20-32 cmH20] 27 cmH20   Intake/Output Summary (Last 24 hours) at 09/08/2023 0724 Last data filed at 09/08/2023 9344 Gross per 24 hour  Intake 3619.6 ml  Output 340 ml  Net 3279.6 ml   Filed Weights   09/06/23 0500 09/07/23 0233 09/08/23 0500  Weight: 107.8 kg 109.6 kg 108.7 kg    Examination: General: critically ill appearing on mech vent HEENT: MM pink/moist; ETT in place Neuro: sedate; perrl CV: s1s2, RRR, no m/r/g PULM:  dim rhonchi BS bilaterally; on mech vent PRVC GI: soft, bsx4 active  Extremities: warm/dry, diffuse anasarca  Resolved Hospital Problem list   Hypokalemia HTN AKI Hemoptysis (resolved) secondary to sxn trauma  Hyperbilirubinemia Petechial rash resolved Rash on knee-- treated for  ringworm w/ antifungal X 2 weeks Post cardiac arrest shock. 1/2  -Cardiac arrest precipitated by airway obstruction, ? mucous plugging, with residual hypotension 2/2 sedating meds  Assessment & Plan:  Acute on chronic hypoxic and hypercarbic respiratory failure with trach/vent dependence since 06/2023 Serratia HAP w/ ARDS  Tracheomalacia  -Largely driven by severe neuromuscular weakness but also due to intermittent infections, pneumonias. Currently with Serratia.  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  Family requesting transfer to tertiary facility -Denied at multiple medical centers  Paroxysmal  Afib Fluid overload P: -off AC w/ hx of GIB and continual drop in hgb when restarted ac; cont to hold ac today and consider resuming tomorrow -amio  AKI Plan: -given albumin  yesterday; will give some more today -if creat continues to rise will consider nephrology -Trend BMP / urinary output -Replace electrolytes as indicated -Avoid nephrotoxic agents, ensure adequate renal perfusion   ABLA 2/2 UGIB/ duodenitis, now more anemia of critical illness Thrombocytopenia, stable, suspected 2/2 sepsis - off AC since 12/5 P: -transfused 2 units PRBCs yesterday; held lovenox  -cont to hold ac -PPI bid -trend cbc -transfuse for hgb <7   LUE provoked DVT from picc line Difficult IV access  -PICC line was removed back on 12/14 from the left upper extremity.  US  neg for RUE DVT Remains off systemic AC given prior bleeding issues.  Avoid PICC in LUE.  CVL since 12/14, BC neg from 12/23. P: -supportive care -off systemic AC w/ hx of GIB; consider starting low dose ac tomorrow if hgb remains stable  Acute metabolic encephalopathy superimposed on history of anxiety P: -limit sedating meds -wean sedation as able -decreasing seroquel  and zoloft  doses   Unstagable pressure ulcer on sacrum POA P: -WOC following; appreciate recs -CT unremarkable -will  consult surgery for their recs -patient still with persistent diarrhea; flexi in place for now   Moderate protein calorie malnutrition  P: -cont TF  Ring worm left knee -Had two weeks of rx. Better but not resolved P: -cont lamicil   Mild hyperglycemia P: -ssi and cbg monitoring  Severe deconditioning  P: -PT/OT/SLP when appropriate  Diarrhea  P: -fiber -prn loperamide  -cont flexi for now  Discharge planning -remains difficult situation.  Ultimately needs surgery for his valvular disease however his degree of deconditioning, malnutrition, and ventilator dependence continue to be a barrier.  Not a surgical candidate back in  October and certainly not now.  Will need extensive rehabilitation efforts and unclear if liberation from mechanical ventilation is an option.  Family continue to request transfer.  Denied 1/2 by [facility] and [facility].  Remains on wait list for [facility]. Denied at [facility]  I do not think care plan will change regardless of location.   Best Practice (right click and Reselect all SmartList Selections daily)   Diet/type: tubefeeds DVT prophylaxis LMWH Pressure ulcer(s): present on admission  GI prophylaxis: PPI Lines: Central line Foley:  Yes, and it is still needed Code Status:  full code Last date of multidisciplinary goals of care discussion Wife updated at bedside 1/8  Critical care time: 40 minutes    JD Emilio RIGGERS [place] 09/08/2023, 7:24 AM  Please see [email or web address] for pager details.  From 7A-7P if no response, please call [phone]. After hours, please call ELink [phone].

## 2023-09-08 NOTE — Progress Notes (Signed)
 Time out performed with all patient information and procedure provided and confirmed. 1425 Consent confirmed, family at bedside Levophed  started at 5mcg 70 Roc given 1428 100 mcg Fentanyl  given 1428 Lidocaine  administered 1430 Visualization of trachea confirmed by bronch scope 1434 Incision to neck 1435 Incision into trachea  1438 Vital signs stable  Trach placed 1439 Placement confirmed 1440 50 mcg Fentanyl  given 1442 O2 sats down to 78%, bagging performed 1445 O2 sats returned to 96% 1446 Placed back on vent 1452 Laryngoscope placed 1453 Vitals stable  2mg  Versed  given Position confirmed Sutures placed 1457 Case ended 1458

## 2023-09-08 NOTE — Progress Notes (Signed)
 09/08/2023  Timeout performed for tracheostomy revision Fent bolus, roc given, already sedated with prop/fent Bronchoscope advanced into airway by Dr. Harold and pulled back to above old stoma. I used lido/epi to anesthesize old stoma and performed dissection down to trachea. Under direct bronchoscopic visualization I placed a prox XLT 8-0 shiley into airway. Placement showed only 1 cm above carina Then put a 8-0 reg cuffed into airway which was seated well; however balloon was partially within interior stoma which lead to inability to ventilate due to balloon becoming entrapped within stoma and leading to large air leak back up through trachea.  Patient required reintubation after we discovered recurrent ventilation issues. Therefore we needed something between the lengths of a shiley and XLT from the flange to beginning of balloon. We found a 8-47mm interior diameter adjustable bivona (upsize from previous 7-0) and placed it beneath anterior wall stoma and above carina with balloon visualized entirely within trachea.  This allowed adequate ventilation as well as easy passing of suction catheter both of which were issues before. Plan to let rest after all of above and start sedation wean tomorrow. Family at bedside during all procedures above.  Additional CC time 65 mins in addition to planned tracheostomy  Appreciate RT/RN/Dr. Harold help for above.  Rolan Sharps MD PCCM

## 2023-09-08 NOTE — Procedures (Signed)
 Intubation Procedure Note  Joshua Eaton  [id number]  23-Jan-1956  Date:09/08/23  Time:3:54 PM   Provider Performing:Quin Mcpherson    Procedure: Intubation (31500)  Indication(s) Respiratory Failure  Consent Risks of the procedure as well as the alternatives and risks of each were explained to the patient and/or caregiver.  Consent for the procedure was obtained and is signed in the bedside chart   Anesthesia Etomidate  and Rocuronium    Time Out Verified patient identification, verified procedure, site/side was marked, verified correct patient position, special equipment/implants available, medications/allergies/relevant history reviewed, required imaging and test results available.   Sterile Technique Usual hand hygeine, masks, and gloves were used   Procedure Description Patient positioned in bed supine.  Sedation given as noted above.  Patient was intubated with endotracheal tube using  MAC4 .  View was Grade 1 full glottis .  Number of attempts was 1.  Colorimetric CO2 detector was consistent with tracheal placement.   Complications/Tolerance None; patient tolerated the procedure well. Chest X-ray is ordered to verify placement.   EBL Minimal   Specimen(s) None

## 2023-09-08 NOTE — Progress Notes (Signed)
 Pt. Was transported to CT2  and back to 3M11 on 100% O2 without any complications with Rnx2 and RT.

## 2023-09-08 NOTE — Procedures (Signed)
 Bronchoscopy Procedure Note  Joshua Eaton  [id number]  Dec 14, 1955  Date:09/08/23  Time:3:55 PM   Provider Performing:Javonni Macke   Procedure(s):  Flexible Bronchoscopy [phone]) and Flexible bronchoscopy with bronchial alveolar lavage (68375)  Indication(s) Acute respiratory failure  Consent Risks of the procedure as well as the alternatives and risks of each were explained to the patient and/or caregiver.  Consent for the procedure was obtained and is signed in the bedside chart  Anesthesia Etomidate  and Rocuronium    Time Out Verified patient identification, verified procedure, site/side was marked, verified correct patient position, special equipment/implants available, medications/allergies/relevant history reviewed, required imaging and test results available.   Sterile Technique Usual hand hygiene, masks, gowns, and gloves were used   Procedure Description Bronchoscope advanced through endotracheal tube and into airway.  Airways were examined down to subsegmental level with findings noted below.   Following diagnostic evaluation, BAL(s) performed in RLL with normal saline and return of Yellowish fluid  Findings: Copious amounts of tenacious secretions noted throughout the airway, suctioned and BAL performed     Complications/Tolerance None; patient tolerated the procedure well. Chest X-ray is needed post procedure.   EBL Minimal   Specimen(s) BAL

## 2023-09-08 NOTE — Progress Notes (Signed)
 eLink Physician-Brief Progress Note Patient Name: Evelyn Moch DOB: October 20, 1955 MRN: [id number]   Date of Service  09/08/2023  HPI/Events of Note  Tracheostomy performed today, hypercapnic and acidemic postop.  eICU Interventions  Improved with time, pCO2 now 93.5, maintain current ventilatory settings     Intervention Category Intermediate Interventions: Respiratory distress - evaluation and management  Kinnley Paulson 09/08/2023, 11:31 PM

## 2023-09-08 NOTE — Progress Notes (Signed)
 PT Cancellation Note  Patient Details Name: Joshua Eaton MRN: [id number] DOB: 1955/11/11   Cancelled Treatment:    Reason Eval/Treat Not Completed: Medical issues which prohibited therapy. Per discussion with RN, plan is for pt to remain sedated today with new trach today. PT will look to follow up tomorrow.   [name] 09/08/2023, 12:03 PM

## 2023-09-08 NOTE — Progress Notes (Signed)
 Pt losing volumes on vent, Md decided to do trach exchange. 10mg  of Etomidate  given.  Re-intubated by Harold, Md with no complications. ETT in place with successful bagging performed and positive color change. 70mg  of Rocuronium  given. New trach (bivona) placed by Toribio Sharps, MD and placement confirmed from above.

## 2023-09-08 NOTE — Consult Note (Signed)
 Joshua Eaton February 13, 1956  [id number].    Requesting MD: Claudene Chief Complaint/Reason for Consult: Sacral Wound  HPI:  68 y/o M w/ a hx of pAfib, DVT, and colon cancer s/p right hemicolectomy who is currently admitted for respiratory failure and is now s/p trach and PEG.  On exam, patient is resting in bed.  I spoke with his wife at bedside. She reports that the sacral wound was first noted during a hospitalization in October. It has gotten bigger over time but he has not required the area to be debrided in the OR.  CT performed today shows multifocal PNA and no evidence of an abscess or undrained fluid collection related to the sacral wound. Review of imaging shows some fibrinous exudate but an otherwise clean wound base.    ROS: Not obtained due to patient factors  History reviewed. No pertinent family history.  Past Medical History:  Diagnosis Date   Anxiety    Cancer (HCC)    Depression    Hypertension    OCD (obsessive compulsive disorder)     Past Surgical History:  Procedure Laterality Date   BIOPSY  08/15/2023   Procedure: BIOPSY;  Surgeon: Leigh Elspeth SQUIBB, MD;  Location: [facility];  Service: Gastroenterology;;   CHOLECYSTECTOMY     COLON SURGERY     cancerous polyp removed   ESOPHAGOGASTRODUODENOSCOPY (EGD) WITH PROPOFOL  N/A 08/15/2023   Procedure: ESOPHAGOGASTRODUODENOSCOPY (EGD) WITH PROPOFOL ;  Surgeon: Leigh Elspeth SQUIBB, MD;  Location: [facility] ENDOSCOPY;  Service: Gastroenterology;  Laterality: N/A;   HERNIA REPAIR     IR GASTROSTOMY TUBE MOD SED  06/24/2023    Social History:  reports that he has never smoked. He has never been exposed to tobacco smoke. He has never used smokeless tobacco. He reports that he does not drink alcohol  and does not use drugs.  Allergies:  Allergies  Allergen Reactions   Codeine     sends me into orbit    Medications Prior to Admission  Medication Sig Dispense Refill   acetaminophen  (TYLENOL ) 325 MG tablet Place 650  mg into feeding tube every 6 (six) hours as needed for moderate pain (pain score 4-6).     amiodarone  (PACERONE ) 200 MG tablet Place 1 tablet (200 mg total) into feeding tube daily.     insulin  lispro (HUMALOG) 100 UNIT/ML injection Inject 0-12 Units into the skin every 6 (six) hours. <70          Initiate hypoglycemia treatmen 70-150     no insulin  151-200   2 units 201-250   4 units 251-300   6 units 301-350   8 units 351-400   10 units >400        12 units and call medical provider     ipratropium-albuterol  (DUONEB) 0.5-2.5 (3) MG/3ML SOLN Take 3 mLs by nebulization every 4 (four) hours as needed (sob/wheezing).     lidocaine  2% Take 2 mLs by nebulization every 4 (four) hours as needed (For cough).     [EXPIRED] LORazepam  (ATIVAN ) 2 MG/ML injection Inject 2 mg into the vein every 8 (eight) hours as needed for anxiety.     [EXPIRED] meropenem  (MERREM ) 1 g injection Inject 1 g into the vein every 8 (eight) hours. Administer over 3 minutes by small volume IV push     Mouthwashes (BIOTENE DRY MOUTH MT) Use as directed 1 spray in the mouth or throat as needed (Dry mouth).     ondansetron  (ZOFRAN ) 4 MG/2ML SOLN injection Inject 4  mg into the vein every 6 (six) hours as needed for nausea or vomiting.     [EXPIRED] oxyCODONE  (OXY IR/ROXICODONE ) 5 MG immediate release tablet Place 5 mg into feeding tube every 4 (four) hours as needed for severe pain (pain score 7-10).     pantoprazole  (PROTONIX ) 40 MG injection Inject 40 mg into the vein 2 (two) times daily.     potassium chloride  SA (KLOR-CON  M) 20 MEQ tablet Take 1 tablet (20 mEq total) by mouth daily. Or every 4 hours as needed for potassium electrolyte replacement. (Patient taking differently: Take 40 mEq by mouth 2 (two) times daily.)     QUEtiapine  (SEROQUEL ) 50 MG tablet Place 0.5 tablets (25 mg total) into feeding tube every morning. (Patient taking differently: Place 50 mg into feeding tube 3 (three) times daily.)     sertraline  (ZOLOFT ) 100  MG tablet TAKE 2 TABLETS BY MOUTH IN THE  MORNING AND 1 TABLET BY MOUTH IN THE EVENING 270 tablet 0   silver sulfADIAZINE (SILVADENE) 1 % cream Apply 1 Application topically as needed (For wound care).     [EXPIRED] vancomycin  1,000 mg in sodium chloride  0.9 % 250 mL Inject 1,000 mg into the vein every 12 (twelve) hours.     zolpidem  (AMBIEN ) 5 MG tablet Place 5 mg into feeding tube at bedtime.     apixaban  (ELIQUIS ) 5 MG TABS tablet Place 1 tablet (5 mg total) into feeding tube 2 (two) times daily. (Patient not taking: Reported on 08/14/2023)     Chlorhexidine  Gluconate Cloth 2 % PADS Apply 6 each topically daily.     Electrolyte-A in Dextrose  (DEXTROSE  50%/ELECTROLYTES IV) Inject 25 g into the vein daily as needed (blood sugar 50mg /dL).     Glucagon HCl 1 MG SOLR Inject 1 mg as directed once as needed (hypoglycemia).     insulin  aspart (NOVOLOG ) 100 UNIT/ML injection Inject 0-15 Units into the skin every 4 (four) hours. (Patient not taking: Reported on 08/14/2023)     lip balm (CARMEX) ointment Apply topically as needed.     magnesium  oxide (MAG-OX) 400 (240 Mg) MG tablet Place 400 mg into feeding tube every 6 (six) hours as needed (magnesium  electrolyte replacement).     nutrition supplement, JUVEN, (JUVEN) PACK Place 1 packet into feeding tube 2 (two) times daily between meals.     Nutritional Supplements (FEEDING SUPPLEMENT, JEVITY 1.5 CAL/FIBER,) LIQD Place 1,000 mLs into feeding tube continuous.     Protein (FEEDING SUPPLEMENT, PROSOURCE TF20,) liquid Place 60 mLs into feeding tube daily.      Physical Exam: Blood pressure (!) 122/55, pulse 74, temperature 97.7 F (36.5 C), temperature source Axillary, resp. rate (!) 22, height 6' 0.99 (1.854 m), weight 108.7 kg, SpO2 90%. Gen: sedated on the vent, NAD Sacrum: review of imaging shows a sacral wound with some fibrinous exudate but no signs of necrosis, wound base appears healthy   Results for orders placed or performed during the  hospital encounter of 08/13/23 (from the past 48 hours)  Glucose, capillary     Status: Abnormal   Collection Time: 09/06/23 11:14 AM  Result Value Ref Range   Glucose-Capillary 115 (H) 70 - 99 mg/dL    Comment: Glucose reference range applies only to samples taken after fasting for at least 8 hours.  Glucose, capillary     Status: Abnormal   Collection Time: 09/06/23  3:31 PM  Result Value Ref Range   Glucose-Capillary 128 (H) 70 - 99 mg/dL  Comment: Glucose reference range applies only to samples taken after fasting for at least 8 hours.  Glucose, capillary     Status: Abnormal   Collection Time: 09/06/23  7:34 PM  Result Value Ref Range   Glucose-Capillary 139 (H) 70 - 99 mg/dL    Comment: Glucose reference range applies only to samples taken after fasting for at least 8 hours.  Glucose, capillary     Status: Abnormal   Collection Time: 09/06/23 11:19 PM  Result Value Ref Range   Glucose-Capillary 116 (H) 70 - 99 mg/dL    Comment: Glucose reference range applies only to samples taken after fasting for at least 8 hours.  Glucose, capillary     Status: Abnormal   Collection Time: 09/07/23  3:14 AM  Result Value Ref Range   Glucose-Capillary 122 (H) 70 - 99 mg/dL    Comment: Glucose reference range applies only to samples taken after fasting for at least 8 hours.  Triglycerides     Status: None   Collection Time: 09/07/23  4:25 AM  Result Value Ref Range   Triglycerides 72 <150 mg/dL    Comment: Performed at [facility], [address] [place], [place] [address]  Renal function panel     Status: Abnormal   Collection Time: 09/07/23  4:25 AM  Result Value Ref Range   Sodium 142 135 - 145 mmol/L   Potassium 4.3 3.5 - 5.1 mmol/L   Chloride 95 (L) 98 - 111 mmol/L   CO2 35 (H) 22 - 32 mmol/L   Glucose, Bld 131 (H) 70 - 99 mg/dL    Comment: Glucose reference range applies only to samples taken after fasting for at least 8 hours.   BUN 63 (H) 8 - 23 mg/dL   Creatinine, Ser  8.44 (H) 0.61 - 1.24 mg/dL   Calcium 8.9 8.9 - 89.6 mg/dL   Phosphorus 6.2 (H) 2.5 - 4.6 mg/dL   Albumin  2.1 (L) 3.5 - 5.0 g/dL   GFR, Estimated 48 (L) >60 mL/min    Comment: (NOTE) Calculated using the CKD-EPI Creatinine Equation (2021)    Anion gap 12 5 - 15    Comment: Performed at [facility], [address] [place], [place] [address]  Glucose, capillary     Status: Abnormal   Collection Time: 09/07/23  7:56 AM  Result Value Ref Range   Glucose-Capillary 113 (H) 70 - 99 mg/dL    Comment: Glucose reference range applies only to samples taken after fasting for at least 8 hours.  CBC     Status: Abnormal   Collection Time: 09/07/23  9:22 AM  Result Value Ref Range   WBC 9.8 4.0 - 10.5 K/uL   RBC 2.11 (L) 4.22 - 5.81 MIL/uL   Hemoglobin 6.3 (LL) 13.0 - 17.0 g/dL    Comment: REPEATED TO VERIFY THIS CRITICAL RESULT HAS VERIFIED AND BEEN CALLED TO LOUIS GUERRIERO RN BY ABEER ALTOM ON 01 07 2025 AT 1015, AND HAS BEEN READ BACK.     HCT 20.6 (L) 39.0 - 52.0 %   MCV 97.6 80.0 - 100.0 fL   MCH 29.9 26.0 - 34.0 pg   MCHC 30.6 30.0 - 36.0 g/dL   RDW 82.7 (H) 88.4 - 84.4 %   Platelets 74 (L) 150 - 400 K/uL    Comment: Immature Platelet Fraction may be clinically indicated, consider ordering this additional test [id number] REPEATED TO VERIFY PLATELET COUNT CONFIRMED BY SMEAR    nRBC 0.0  0.0 - 0.2 %    Comment: Performed at [facility], [address], [place], [place] [address]  Prepare RBC (crossmatch)     Status: None   Collection Time: 09/07/23 11:05 AM  Result Value Ref Range   Order Confirmation      ORDER PROCESSED BY BLOOD BANK Performed at [facility], [address] [place], [place] [address]   Type and screen [facility]     Status: None   Collection Time: 09/07/23 11:05 AM  Result Value Ref Range   ABO/RH(D) O POS    Antibody Screen NEG    Sample Expiration 09/10/2023,2359    Unit Number [id number]    Blood Component Type  RBC LR PHER1    Unit division 00    Status of Unit ISSUED,FINAL    Transfusion Status OK TO TRANSFUSE    Crossmatch Result      Compatible Performed at [facility], [address] [place], [place] [address]    Unit Number [id number]    Blood Component Type RED CELLS,LR    Unit division 00    Status of Unit ISSUED,FINAL    Transfusion Status OK TO TRANSFUSE    Crossmatch Result Compatible   Glucose, capillary     Status: Abnormal   Collection Time: 09/07/23 12:34 PM  Result Value Ref Range   Glucose-Capillary 109 (H) 70 - 99 mg/dL    Comment: Glucose reference range applies only to samples taken after fasting for at least 8 hours.  Glucose, capillary     Status: None   Collection Time: 09/07/23  3:44 PM  Result Value Ref Range   Glucose-Capillary 99 70 - 99 mg/dL    Comment: Glucose reference range applies only to samples taken after fasting for at least 8 hours.  Hemoglobin and hematocrit, blood     Status: Abnormal   Collection Time: 09/07/23  6:00 PM  Result Value Ref Range   Hemoglobin 7.0 (L) 13.0 - 17.0 g/dL   HCT 77.3 (L) 60.9 - 47.9 %    Comment: Performed at [facility], [address], [place], [place] [address]  Prepare RBC (crossmatch)     Status: None   Collection Time: 09/07/23  7:00 PM  Result Value Ref Range   Order Confirmation      ORDER PROCESSED BY BLOOD BANK Performed at [facility], [address] [place], [place] [address]   Glucose, capillary     Status: Abnormal   Collection Time: 09/07/23  7:45 PM  Result Value Ref Range   Glucose-Capillary 114 (H) 70 - 99 mg/dL    Comment: Glucose reference range applies only to samples taken after fasting for at least 8 hours.  Glucose, capillary     Status: Abnormal   Collection Time: 09/07/23 11:23 PM  Result Value Ref Range   Glucose-Capillary 117 (H) 70 - 99 mg/dL    Comment: Glucose reference range applies only to samples taken after fasting for at least 8 hours.  Glucose,  capillary     Status: None   Collection Time: 09/08/23  3:18 AM  Result Value Ref Range   Glucose-Capillary 97 70 - 99 mg/dL    Comment: Glucose reference range applies only to samples taken after fasting for at least 8 hours.  Renal function panel     Status: Abnormal   Collection Time: 09/08/23  4:27 AM  Result Value Ref Range   Sodium  141 135 - 145 mmol/L   Potassium 4.6 3.5 - 5.1 mmol/L   Chloride 97 (L) 98 - 111 mmol/L   CO2 32 22 - 32 mmol/L   Glucose, Bld 120 (H) 70 - 99 mg/dL    Comment: Glucose reference range applies only to samples taken after fasting for at least 8 hours.   BUN 73 (H) 8 - 23 mg/dL   Creatinine, Ser 7.77 (H) 0.61 - 1.24 mg/dL   Calcium 8.8 (L) 8.9 - 10.3 mg/dL   Phosphorus 8.3 (H) 2.5 - 4.6 mg/dL   Albumin  2.5 (L) 3.5 - 5.0 g/dL   GFR, Estimated 31 (L) >60 mL/min    Comment: (NOTE) Calculated using the CKD-EPI Creatinine Equation (2021)    Anion gap 12 5 - 15    Comment: Performed at [facility], [address], [place], [place] [address]  CBC     Status: Abnormal   Collection Time: 09/08/23  4:27 AM  Result Value Ref Range   WBC 9.8 4.0 - 10.5 K/uL   RBC 2.69 (L) 4.22 - 5.81 MIL/uL   Hemoglobin 8.0 (L) 13.0 - 17.0 g/dL   HCT 74.3 (L) 60.9 - 47.9 %   MCV 95.2 80.0 - 100.0 fL   MCH 29.7 26.0 - 34.0 pg   MCHC 31.3 30.0 - 36.0 g/dL   RDW 82.2 (H) 88.4 - 84.4 %   Platelets 74 (L) 150 - 400 K/uL    Comment: Immature Platelet Fraction may be clinically indicated, consider ordering this additional test [id number] REPEATED TO VERIFY    nRBC 0.0 0.0 - 0.2 %    Comment: Performed at [facility], [address] [place], [place] [address]  Glucose, capillary     Status: Abnormal   Collection Time: 09/08/23  7:33 AM  Result Value Ref Range   Glucose-Capillary 121 (H) 70 - 99 mg/dL    Comment: Glucose reference range applies only to samples taken after fasting for at least 8 hours.   CT ABDOMEN PELVIS WO CONTRAST Result Date:  09/08/2023 CLINICAL DATA:  Retroperitoneal bleed suspected, weakness, abnormal labs EXAM: CT ABDOMEN AND PELVIS WITHOUT CONTRAST TECHNIQUE: Multidetector CT imaging of the abdomen and pelvis was performed following the standard protocol without IV contrast. RADIATION DOSE REDUCTION: This exam was performed according to the departmental dose-optimization program which includes automated exposure control, adjustment of the mA and/or kV according to patient size and/or use of iterative reconstruction technique. COMPARISON:  CT 08/12/2023 FINDINGS: Lower chest: Diffuse airspace opacities in the lower lungs. Small bilateral pleural effusions. Hepatobiliary: Unremarkable noncontrast appearance of the liver. Cholecystectomy. No biliary dilation. Pancreas: Unremarkable. Spleen: Unremarkable. Adrenals/Urinary Tract: Stable adrenal glands. No urinary calculi or hydronephrosis. Decompressed bladder about the Foley catheter with locules of gas in the bladder lumen. Mild perivesical fat stranding. Stomach/Bowel: Rectal tube. Normal caliber large and small bowel. Normal caliber large and small bowel. Colonic diverticulosis without diverticulitis. Mildly hyperdense liquid stool in the colon. This is nonspecific but hemorrhage is not excluded. Correlation with hemoccult is recommended. Gastrostomy tube. Distended stomach. Postoperative change of partial colectomy with ileocolonic anastomosis in the right upper quadrant. Vascular/Lymphatic: Aortic atherosclerosis. No enlarged abdominal or pelvic lymph nodes. Reproductive: Unremarkable. Other: Body wall anasarca. Marked scrotal edema. No free intraperitoneal air. No retroperitoneal hematoma. Musculoskeletal: No acute fracture. IMPRESSION: 1. No retroperitoneal hematoma. 2. Mildly hyperdense liquid stool in the colon. This is nonspecific but blood products are not excluded. Correlation with hemoccult is recommended. 3. Multifocal  pneumonia, edema, and/or aspiration in the lower lungs  with small bilateral pleural effusions. 4. Decompressed bladder about a Foley catheter. Perivesical stranding can be seen with cystitis. 5. Body wall anasarca. Marked scrotal edema. Aortic Atherosclerosis (ICD10-I70.0). Electronically Signed   By: Norman Gatlin M.D.   On: 09/08/2023 02:26    Assessment/Plan 68 y/o M who is critically ill and has a sacral decubitus wound  - I would advise against surgery at this time. Surgery is generally reserved for debriding large amounts of necrotic tissue and/or to address underlying fluid collections. There is no evidence of either at this time and I would be hesitant to give him a larger wound to heal - Can trial Dakins for 2-3 days to assist with mechanical debridement at the bedside - Continue offloading pressure to the area - Would recommend continuing to optimize nutrition to assist with wound healing. Albumin  2.5 but improving.   - Surgery will sign off but will remain available to reassess as necessary  Cordella DELENA Polly Marlis Cheron Surgery 09/08/2023, 8:00 AM Please see Amion for pager number during day hours 7:00am-4:30pm or 7:00am -11:30am on weekends

## 2023-09-09 ENCOUNTER — Inpatient Hospital Stay (HOSPITAL_COMMUNITY): Payer: Medicare Other

## 2023-09-09 DIAGNOSIS — J9601 Acute respiratory failure with hypoxia: Secondary | ICD-10-CM | POA: Diagnosis not present

## 2023-09-09 LAB — POCT I-STAT 7, (LYTES, BLD GAS, ICA,H+H)
Acid-Base Excess: 10 mmol/L — ABNORMAL HIGH (ref 0.0–2.0)
Acid-Base Excess: 11 mmol/L — ABNORMAL HIGH (ref 0.0–2.0)
Bicarbonate: 37.7 mmol/L — ABNORMAL HIGH (ref 20.0–28.0)
Bicarbonate: 40.2 mmol/L — ABNORMAL HIGH (ref 20.0–28.0)
Calcium, Ion: 1.2 mmol/L (ref 1.15–1.40)
Calcium, Ion: 1.22 mmol/L (ref 1.15–1.40)
HCT: 21 % — ABNORMAL LOW (ref 39.0–52.0)
HCT: 22 % — ABNORMAL LOW (ref 39.0–52.0)
Hemoglobin: 7.1 g/dL — ABNORMAL LOW (ref 13.0–17.0)
Hemoglobin: 7.5 g/dL — ABNORMAL LOW (ref 13.0–17.0)
O2 Saturation: 91 %
O2 Saturation: 99 %
Patient temperature: 36.5
Patient temperature: 36.5
Potassium: 5.1 mmol/L (ref 3.5–5.1)
Potassium: 5 mmol/L (ref 3.5–5.1)
Sodium: 140 mmol/L (ref 135–145)
Sodium: 141 mmol/L (ref 135–145)
TCO2: 40 mmol/L — ABNORMAL HIGH (ref 22–32)
TCO2: 43 mmol/L — ABNORMAL HIGH (ref 22–32)
pCO2 arterial: 76.8 mm[Hg] (ref 32–48)
pCO2 arterial: 99.4 mm[Hg] (ref 32–48)
pH, Arterial: 7.297 — ABNORMAL LOW (ref 7.35–7.45)
pH, Arterial: 7.213 — ABNORMAL LOW (ref 7.35–7.45)
pO2, Arterial: 70 mm[Hg] — ABNORMAL LOW (ref 83–108)
pO2, Arterial: 159 mm[Hg] — ABNORMAL HIGH (ref 83–108)

## 2023-09-09 LAB — GLUCOSE, CAPILLARY
Glucose-Capillary: 119 mg/dL — ABNORMAL HIGH (ref 70–99)
Glucose-Capillary: 112 mg/dL — ABNORMAL HIGH (ref 70–99)
Glucose-Capillary: 123 mg/dL — ABNORMAL HIGH (ref 70–99)
Glucose-Capillary: 135 mg/dL — ABNORMAL HIGH (ref 70–99)
Glucose-Capillary: 122 mg/dL — ABNORMAL HIGH (ref 70–99)
Glucose-Capillary: 96 mg/dL (ref 70–99)
Glucose-Capillary: 119 mg/dL — ABNORMAL HIGH (ref 70–99)

## 2023-09-09 LAB — RENAL FUNCTION PANEL
Albumin: 3 g/dL — ABNORMAL LOW (ref 3.5–5.0)
Anion gap: 15 (ref 5–15)
BUN: 92 mg/dL — ABNORMAL HIGH (ref 8–23)
CO2: 32 mmol/L (ref 22–32)
Calcium: 9 mg/dL (ref 8.9–10.3)
Chloride: 94 mmol/L — ABNORMAL LOW (ref 98–111)
Creatinine, Ser: 2.88 mg/dL — ABNORMAL HIGH (ref 0.61–1.24)
GFR, Estimated: 23 mL/min — ABNORMAL LOW (ref >60)
Glucose, Bld: 115 mg/dL — ABNORMAL HIGH (ref 70–99)
Phosphorus: 8.4 mg/dL — ABNORMAL HIGH (ref 2.5–4.6)
Potassium: 5.1 mmol/L (ref 3.5–5.1)
Sodium: 141 mmol/L (ref 135–145)

## 2023-09-09 LAB — BASIC METABOLIC PANEL
Anion gap: 14 (ref 5–15)
BUN: 91 mg/dL — ABNORMAL HIGH (ref 8–23)
CO2: 33 mmol/L — ABNORMAL HIGH (ref 22–32)
Calcium: 8.9 mg/dL (ref 8.9–10.3)
Chloride: 95 mmol/L — ABNORMAL LOW (ref 98–111)
Creatinine, Ser: 2.95 mg/dL — ABNORMAL HIGH (ref 0.61–1.24)
GFR, Estimated: 22 mL/min — ABNORMAL LOW (ref >60)
Glucose, Bld: 116 mg/dL — ABNORMAL HIGH (ref 70–99)
Potassium: 5.1 mmol/L (ref 3.5–5.1)
Sodium: 142 mmol/L (ref 135–145)

## 2023-09-09 LAB — LACTATE DEHYDROGENASE: LDH: 238 U/L — ABNORMAL HIGH (ref 98–192)

## 2023-09-09 LAB — CBC
HCT: 22.5 % — ABNORMAL LOW (ref 39.0–52.0)
Hemoglobin: 7.1 g/dL — ABNORMAL LOW (ref 13.0–17.0)
MCH: 30.1 pg (ref 26.0–34.0)
MCHC: 31.6 g/dL (ref 30.0–36.0)
MCV: 95.3 fL (ref 80.0–100.0)
Platelets: 56 10*3/uL — ABNORMAL LOW (ref 150–400)
RBC: 2.36 MIL/uL — ABNORMAL LOW (ref 4.22–5.81)
RDW: 17.4 % — ABNORMAL HIGH (ref 11.5–15.5)
WBC: 5.8 10*3/uL (ref 4.0–10.5)
nRBC: 0 % (ref 0.0–0.2)

## 2023-09-09 LAB — ZINC: Zinc: 64 ug/dL (ref 44–115)

## 2023-09-09 MED ORDER — QUETIAPINE FUMARATE 25 MG PO TABS
25.0000 mg | ORAL_TABLET | Freq: Every day | ORAL | Status: DC
  Administered 2023-09-09 – 2023-09-13 (×5): 25 mg
  Filled 2023-09-09 (×5): qty 1

## 2023-09-09 MED ORDER — ALBUMIN HUMAN 25 % IV SOLN: 25.0000 g | Freq: Once | INTRAVENOUS | Status: AC

## 2023-09-09 MED ORDER — SODIUM CHLORIDE 3 % IN NEBU
4.0000 mL | INHALATION_SOLUTION | Freq: Four times a day (QID) | RESPIRATORY_TRACT | Status: AC
  Administered 2023-09-09 – 2023-09-12 (×12): 4 mL via RESPIRATORY_TRACT
  Filled 2023-09-09 (×11): qty 15

## 2023-09-09 MED ORDER — FUROSEMIDE 10 MG/ML IJ SOLN
80.0000 mg | Freq: Once | INTRAMUSCULAR | Status: AC
  Administered 2023-09-09: 80 mg via INTRAVENOUS
  Filled 2023-09-09: qty 8

## 2023-09-09 MED ORDER — ALBUTEROL SULFATE (2.5 MG/3ML) 0.083% IN NEBU
2.5000 mg | INHALATION_SOLUTION | Freq: Four times a day (QID) | RESPIRATORY_TRACT | Status: DC
  Administered 2023-09-09 – 2023-09-15 (×24): 2.5 mg via RESPIRATORY_TRACT
  Filled 2023-09-09 (×25): qty 3

## 2023-09-09 MED ORDER — ALBUMIN HUMAN 25 % IV SOLN
25.0000 g | Freq: Once | INTRAVENOUS | Status: AC
  Administered 2023-09-09: 25 g via INTRAVENOUS
  Filled 2023-09-09: qty 100

## 2023-09-09 MED ORDER — SODIUM BICARBONATE 8.4 % IV SOLN
100.0000 meq | Freq: Once | INTRAVENOUS | Status: AC
  Administered 2023-09-09: 100 meq via INTRAVENOUS
  Filled 2023-09-09: qty 50

## 2023-09-09 MED ORDER — SERTRALINE HCL 50 MG PO TABS
25.0000 mg | ORAL_TABLET | Freq: Every day | ORAL | Status: DC
  Administered 2023-09-09 – 2023-09-13 (×5): 25 mg
  Filled 2023-09-09 (×5): qty 1

## 2023-09-09 MED ORDER — SODIUM CHLORIDE 0.9 % IV SOLN
2.0000 g | INTRAVENOUS | Status: DC
  Administered 2023-09-10: 2 g via INTRAVENOUS
  Filled 2023-09-09: qty 12.5

## 2023-09-09 NOTE — Plan of Care (Signed)
  Problem: Activity: Goal: Ability to tolerate increased activity will improve Outcome: Progressing   Problem: Nutrition: Goal: Adequate nutrition will be maintained Outcome: Progressing   

## 2023-09-09 NOTE — TOC Progression Note (Signed)
 Transition of Care [facility]) - Progression Note    Patient Details  Name: Joshua Eaton MRN: [id number] Date of Birth: Jan 16, 1956  Transition of [facility]) CM/SW Contact  Tom-Johnson, Harvest Muskrat, RN Phone Number: 09/09/2023, 3:45 PM  Clinical Narrative:     Patient underwent Tracheostomy replacement yesterday 09/08/23, currently on Trach/Vent. Patient's transfer to [facility] for Trachelopexy procedure was denied.   Nephrology consulted for worsening Kidney function.  Family updated by MD.  Patient not Medically ready for discharge.  CM will continue to follow as patient progresses with care towards discharge.         Expected Discharge Plan and Services                                               Social Determinants of Health (SDOH) Interventions SDOH Screenings   Food Insecurity: No Food Insecurity (08/21/2023)  Housing: Unknown (08/21/2023)  Transportation Needs: No Transportation Needs (08/21/2023)  Utilities: Not At Risk (08/21/2023)  Financial Resource Strain: Patient Unable To Answer (07/14/2023)   Received from [facility]  Social Connections: Patient Unable To Answer (09/08/2023)  Stress: Patient Unable To Answer (07/14/2023)   Received from [facility]  Tobacco Use: Low Risk  (08/31/2023)   Received from [facility]    Readmission Risk Interventions    08/16/2023    3:55 PM  Readmission Risk Prevention Plan  Transportation Screening Complete  Medication Review (RN Care Manager) Referral to Pharmacy  PCP or Specialist appointment within 3-5 days of discharge Complete  HRI or Home Care Consult Complete  SW Recovery Care/Counseling Consult Complete  Skilled Nursing Facility Complete

## 2023-09-09 NOTE — Progress Notes (Signed)
 ABG drawn with critical values resulted. MD notified, no changes at this time per MD.

## 2023-09-09 NOTE — Progress Notes (Signed)
 NAME:  Joshua Eaton, MRN:  [id number], DOB:  September 11, 1955, LOS: 26 ADMISSION DATE:  08/13/2023, CONSULTATION DATE:  12/13 REFERRING MD:  Dr. Yolande, CHIEF COMPLAINT:  GIB   History of Present Illness:  Patient is a 68 yo M w/ pertinent PMH chronic respiratory failure w/ tracheostomy/[facility], Afib on eliquis  presents to [facility] ED on 12/13 w/ GIB.  Patient recently admitted w/ respiratory failure and pna in October 2024 requiring tracheostomy. [facility] was later [facility]. Later that day had an accidental trach dislodgement. His wob continued to increase requiring re-admission to [facility] on 10/31 and was intubated and later trached during hospital admission. [facility].   On 12/13 patient admitted to [facility] w/ drop in hgb and concern for GIB. GI consulted for possible endoscopy. TRH to admit and PCCM to consult for vent management.  Pertinent  Medical History   Past Medical History:  Diagnosis Date   Anxiety    Cancer (HCC)    Depression    Hypertension    OCD (obsessive compulsive disorder)    Significant Hospital Events: Including procedures, antibiotic start and stop dates in addition to other pertinent events   12/13 admitted w/ GIB. Pccm to consult for vent management 12/14 1 unit pRBC 12/15 EGD - erosion around PEG site but no active bleeding; cratered ulcer biopsied near peg site.  12/16 plugging with clots- posterior wall bleeding from distal end of trach. Hemoptysis secondary to tracheostomy malpositioning.  This is resolved. Patient had originally been on IV heparin  for DVT.  Had tracheostomy in place, and developed fairly significant hemoptysis back on 12/16.  Bronchoscopy identified the distal tip of the ostomy abutting the posterior wall of the trachea with small amount of ulceration.  He received TXA, heavy sedation, anticoagulation was held. 12/23 family fired pccm 12/24 pccm asked to re-engage  by ENT via RN staff .  12/29 # 7 adjustable length cuffed biovona placed set at 110 mm wife updated  12/30 Bedside bronchoscopy with BAL of RML 12/31 Worsening status with difficulty to ventilate, sedated, paralyzed, trach repositioned.  Started meropenem  for Serratia HAP 1/1 follow up for  biovona adjustable trach, still could not achieve optimal positioning with distal tip at posterior wall.  Ultimately required #6 proximal XLT Brovona changed trach to 6 prox xlt. Excellent mid-line positioning. Improved gas exchange.  1/2 was doing well. NMB discontinued. CXR was looking better. We were escalating diuretics. Had sudden desaturation, Nursing reports large plugs removed but remained hypoxic. RT was NOT able to pass suction cath when they arrived to assist. Also not able to provide manual BMV.  This progressed to bradycardic and then PEA cardiac arrest. Had 1 round ~ 3 minutes CPR. During this time Trach removed (as could not pass) intubated w/ # 7.5 ETT from above.  1/3  Weaning pressors. No events overnight  1/4 Dr. Geronimo spoke to Dr Maximino of CCM at [facility]- says no beds in [place] or [place] and declined transfer + lateral transfer -> they also called back later and said they are removing [name] from their list and for us  to call back in  >1 weeks to see if bed available but they are not considering [name] for acceptance  1/5 No acute issues overnight, remains on fent and prop but off pressors.  1/7 remains intubated/sedated; drop in hgb requiring 2u PRBCs; [facility] denied transfer for tracheopexy procedure; family meeting discussed and  plan on trach tomorrow 1/8 yesterday trached requiring 8 bovana; 8 shiley and 8 proximal xlt placed but significant cuff leak  Interim History / Subjective:  Trached on vent 100% 8 peep Sedate w/ prop 30 and 50 fent UOP 30 cc last 24 hours; creat 2.88 from 2.22 yesterday  Objective   Blood pressure (!) 99/49, pulse 73, temperature 97.7 F (36.5  C), temperature source Axillary, resp. rate (!) 34, height 6' 0.99 (1.854 m), weight 108.7 kg, SpO2 98%.    Vent Mode: PRVC FiO2 (%):  [60 %-100 %] 100 % Set Rate:  [22 bmp-30 bmp] 30 bmp Vt Set:  [470 mL-480 mL] 470 mL PEEP:  [8 cmH20-10 cmH20] 8 cmH20 Plateau Pressure:  [34 cmH20-42 cmH20] 36 cmH20   Intake/Output Summary (Last 24 hours) at 09/09/2023 0747 Last data filed at 09/09/2023 0600 Gross per 24 hour  Intake 2571.3 ml  Output 135 ml  Net 2436.3 ml   Filed Weights   09/06/23 0500 09/07/23 0233 09/08/23 0500  Weight: 107.8 kg 109.6 kg 108.7 kg    Examination: General: critically ill appearing on mech vent HEENT: MM pink/moist; 8 bovana trach in place Neuro: sedate; perrl CV: s1s2, RRR, no m/r/g PULM:  dim rhonchi BS bilaterally; on mech vent PRVC GI: soft, bsx4 active  Extremities: warm/dry, diffuse anasarca  Resolved Hospital Problem list   Hypokalemia HTN AKI Hemoptysis (resolved) secondary to sxn trauma  Hyperbilirubinemia Petechial rash resolved Rash on knee-- treated for  ringworm w/ antifungal X 2 weeks Post cardiac arrest shock. 1/2  -Cardiac arrest precipitated by airway obstruction, ? mucous plugging, with residual hypotension 2/2 sedating meds  Assessment & Plan:  Acute on chronic hypoxic and hypercarbic respiratory failure with trach/vent dependence since 06/2023 Serratia HAP w/ ARDS  Tracheomalacia  -Largely driven by severe neuromuscular weakness but also due to intermittent infections, pneumonias. Currently with Serratia.  - trach out 1/2 due to obstruction/ inability to ventilate -1/8 re-trached w/ 8 bovana; 8 shiley and 8 proximal xlt attempted but significant cuff leak P: -trached yesterday -trach care per protocol -LTVV strategy with tidal volumes of 6-8 cc/kg ideal body weight -will increase peep to 10 and wean PEEP/FiO2 for SpO2 >90% -VAP bundle in place -Daily SAT and SBT -PAD protocol in place -wean sedation for RASS goal 0 to  -1 -cont cefepime  for serratia hcap -aspergillus sent 1/7; follow -CPT; hypertonic saline and albuterol  nebs  -abg overnight w/ persistent resp acidosis on rate 30; repeat abg this am and adjust settings accordingly  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  -Not an operative candidate until significant rehabilitation.  Family requesting transfer to tertiary facility -Denied at multiple medical centers  Paroxysmal Afib Fluid overload P: -off AC w/ hx of GIB and continual drop in hgb when restarted ac; cont to hold ac today and consider resuming tomorrow -amio -minimal UOP; if continues to have low UOP and rising creat may need HD for volume removal  Oliguric AKI Plan: -nephro consulted; appreciate recs; concerned patient is heading in direction of HD -renal US  -Trend BMP / urinary output -Replace electrolytes as indicated -Avoid nephrotoxic agents, ensure adequate renal perfusion   ABLA 2/2 UGIB/ duodenitis, now more anemia of critical illness Thrombocytopenia, stable, suspected 2/2 sepsis - off AC since 12/5 P: -hgb 7.1; cont to hold ac today and consider resuming tomorrow -PPI bid -trend cbc -transfuse for hgb <7   LUE provoked DVT from picc line Difficult IV access  -PICC line was removed  back on 12/14 from the left upper extremity.  US  neg for RUE DVT Remains off systemic AC given prior bleeding issues.  Avoid PICC in LUE.  CVL since 12/14, BC neg from 12/23. P: -supportive care -off systemic AC w/ hx of GIB; consider starting low dose ac tomorrow if hgb remains stable  Acute metabolic encephalopathy superimposed on history of anxiety P: -limit sedating meds -wean sedation as able -wean seroquel  and zoloft    Unstagable pressure ulcer on sacrum POA P: -WOC following; appreciate recs -surgery following; appreciate recs   Moderate protein calorie malnutrition  P: -cont TF  Ring worm left knee -Had two weeks of rx. Better but not resolved P: -cont  lamicil   Mild hyperglycemia P: -ssi and cbg monitoring  Severe deconditioning  P: -PT/OT/SLP when appropriate  Diarrhea  P: -fiber -prn loperamide  -cont flexi for now  Discharge planning -remains difficult situation.  Ultimately needs surgery for his valvular disease however his degree of deconditioning, malnutrition, and ventilator dependence continue to be a barrier.  Not a surgical candidate back in October and certainly not now.  Will need extensive rehabilitation efforts and unclear if liberation from mechanical ventilation is an option.  Family continue to request transfer.  Denied 1/2 by [facility] and [facility]. Denied again by [facility] on 1/7. Remains on wait list for [facility]. Denied at [facility].  I do not think care plan will change regardless of location.   Best Practice (right click and Reselect all SmartList Selections daily)   Diet/type: tubefeeds DVT prophylaxis SCDs Pressure ulcer(s): present on admission  GI prophylaxis: PPI Lines: Central line Foley:  Yes, and it is still needed Code Status:  full code Last date of multidisciplinary goals of care discussion Wife updated at bedside 1/9  Critical care time: 40 minutes    JD Emilio RIGGERS [facility] 09/09/2023, 7:47 AM  Please see [email or web address] for pager details.  From 7A-7P if no response, please call [phone]. After hours, please call ELink [phone].

## 2023-09-09 NOTE — Progress Notes (Signed)
 Per MD to hold off on CPT.

## 2023-09-09 NOTE — Progress Notes (Signed)
 PT Cancellation Note  Patient Details Name: Joshua Eaton MRN: [id number] DOB: Oct 19, 1955   Cancelled Treatment:    Reason Eval/Treat Not Completed: Patient not medically ready  Spoke with RN, pt remains tenuous. Will hold PT evaluation at this time and plan to check back tomorrow.   Leontine Roads, PT, DPT [facility]  Rehabilitation Services Physical Therapist Office: [phone] Website: [email or web address]  Leontine GORMAN Roads 09/09/2023, 3:06 PM

## 2023-09-09 NOTE — Progress Notes (Signed)
 PHARMACY NOTE:  ANTIMICROBIAL RENAL DOSAGE ADJUSTMENT  Current antimicrobial regimen includes a mismatch between antimicrobial dosage and estimated renal function.  As per policy approved by the Pharmacy & Therapeutics and Medical Executive Committees, the antimicrobial dosage will be adjusted accordingly.  Current antimicrobial dosage:  Cefepime  2g IV every 12 hours  Indication: Serratia pneumonia  Renal Function: *Currently not producing urine  Estimated Creatinine Clearance: 31.3 mL/min (A) (by C-G formula based on SCr of 2.88 mg/dL (H)). []      On intermittent HD, scheduled: []      On CRRT    Antimicrobial dosage has been changed to:  Cefepime  2g IV every 24 hours  Additional comments: Will follow-up renal consult. End date 1/10.    Thank you for allowing pharmacy to be a part of this patient's care.  Harlene Boga, PharmD, BCPS, BCCCP Please refer to [facility] for [facility] numbers 09/09/2023 7:46 AM

## 2023-09-09 NOTE — Consult Note (Signed)
 Reason for Consult: Acute kidney injury Referring Physician: Toribio Sharps, MD (CCM)  HPI:  68 year old man with past medical history significant for hypertension, colon cancer status post right hemicolectomy, paroxysmal atrial fibrillation (on Eliquis ) and chronic ventilator-dependent respiratory failure status post tracheostomy who was admitted to the hospital about 26 days ago with acute blood loss anemia and concern for GI bleed.  EGD revealed erosion around his PEG tube site with a cratered ulcer that was not actively bleeding.  He required tracheostomy exchange to address hemoptysis secondary to malpositioning with improvement following new tracheostomy placement.  Last week, he unfortunately suffered tracheostomy plugging progressing onto bradycardia/PEA arrest with ROSC following 3 minutes of CPR during which the tracheostomy was removed and the patient intubated.  Yesterday underwent replacement of tracheostomy after efforts to transfer him to a tertiary center for tracheopexy were unsuccessful.  Nephrology is consulted because of a progressive rise of creatinine over the last 3 days (prior to 1/6 his creatinine ranged 1.0-1.2) now with a creatinine of 2.9 with a BUN of 92.  Urine output appears to be in the anuric versus oliguric range without response to furosemide  2 days ago.  Arterial blood gas indicates respiratory acidosis with pCO2 of 77, pH 7.29 and serum bicarbonate of 33.  He has had some transient episodes of hypotension and transiently was on pressors last week following cardiac arrest.  No recent iodinated intravenous contrast exposure  Wife available at bedside supplements the above history and understands the possibility that he may require renal replacement therapy.  Past Medical History:  Diagnosis Date   Anxiety    Cancer (HCC)    Depression    Hypertension    OCD (obsessive compulsive disorder)     Past Surgical History:  Procedure Laterality Date   BIOPSY  08/15/2023    Procedure: BIOPSY;  Surgeon: Leigh Elspeth SQUIBB, MD;  Location: [facility] ENDOSCOPY;  Service: Gastroenterology;;   CHOLECYSTECTOMY     COLON SURGERY     cancerous polyp removed   ESOPHAGOGASTRODUODENOSCOPY (EGD) WITH PROPOFOL  N/A 08/15/2023   Procedure: ESOPHAGOGASTRODUODENOSCOPY (EGD) WITH PROPOFOL ;  Surgeon: Leigh Elspeth SQUIBB, MD;  Location: [facility] ENDOSCOPY;  Service: Gastroenterology;  Laterality: N/A;   HERNIA REPAIR     IR GASTROSTOMY TUBE MOD SED  06/24/2023    History reviewed. No pertinent family history.  Social History:  reports that he has never smoked. He has never been exposed to tobacco smoke. He has never used smokeless tobacco. He reports that he does not drink alcohol  and does not use drugs.  Allergies:  Allergies  Allergen Reactions   Codeine     sends me into orbit    Medications: I have reviewed the patient's current medications. Scheduled:  sodium chloride    Intravenous Once   sodium chloride    Intravenous Once   albuterol   2.5 mg Nebulization Q6H   amiodarone   200 mg Per Tube Daily   ascorbic acid   250 mg Per Tube BID   Chlorhexidine  Gluconate Cloth  6 each Topical Daily   feeding supplement (JEVITY 1.5 CAL/FIBER)  237 mL Per Tube 6 X Daily   feeding supplement (PROSource TF20)  60 mL Per Tube BID   fentaNYL  (SUBLIMAZE ) injection  25 mcg Intravenous Once   fiber supplement (BANATROL TF)  60 mL Per Tube BID   guaiFENesin   15 mL Per Tube QID   insulin  aspart  0-15 Units Subcutaneous Q4H   leptospermum manuka honey  1 Application Topical Daily   mouth rinse  15  mL Mouth Rinse Q2H   pantoprazole  (PROTONIX ) IV  40 mg Intravenous Q12H   QUEtiapine   25 mg Per Tube QHS   sertraline   25 mg Per Tube Daily   sodium bicarbonate   100 mEq Intravenous Once   sodium chloride  HYPERTONIC  4 mL Nebulization Q6H   terbinafine    Topical BID   Continuous:  albumin  human 60 mL/hr at 09/09/23 0600   [START ON 09/10/2023] ceFEPime  (MAXIPIME ) IV     fentaNYL  infusion  INTRAVENOUS 150 mcg/hr (09/09/23 1056)   norepinephrine  (LEVOPHED ) Adult infusion Stopped (09/08/23 1747)   propofol  (DIPRIVAN ) infusion 40 mcg/kg/min (09/09/23 1204)       Latest Ref Rng & Units 09/09/2023    8:06 AM 09/09/2023    5:43 AM 09/08/2023   11:13 PM  BMP  Glucose 70 - 99 mg/dL 70 - 99 mg/dL  884    883    BUN 8 - 23 mg/dL 8 - 23 mg/dL  92    91    Creatinine 0.61 - 1.24 mg/dL 9.38 - 8.75 mg/dL  7.11    7.04    Sodium 135 - 145 mmol/L 140  141    142  142   Potassium 3.5 - 5.1 mmol/L 5.1  5.1    5.1  4.9   Chloride 98 - 111 mmol/L 98 - 111 mmol/L  94    95    CO2 22 - 32 mmol/L 22 - 32 mmol/L  32    33    Calcium 8.9 - 10.3 mg/dL 8.9 - 89.6 mg/dL  9.0    8.9        Latest Ref Rng & Units 09/09/2023    8:06 AM 09/09/2023    5:43 AM 09/08/2023   11:13 PM  CBC  WBC 4.0 - 10.5 K/uL  5.8    Hemoglobin 13.0 - 17.0 g/dL 7.1  7.1  8.8   Hematocrit 39.0 - 52.0 % 21.0  22.5  26.0   Platelets 150 - 400 K/uL  56     Urinalysis    Component Value Date/Time   COLORURINE YELLOW 07/01/2023 1816   APPEARANCEUR HAZY (A) 07/01/2023 1816   LABSPEC 1.016 07/01/2023 1816   PHURINE 5.0 07/01/2023 1816   GLUCOSEU NEGATIVE 07/01/2023 1816   HGBUR MODERATE (A) 07/01/2023 1816   BILIRUBINUR NEGATIVE 07/01/2023 1816   KETONESUR NEGATIVE 07/01/2023 1816   PROTEINUR NEGATIVE 07/01/2023 1816   NITRITE NEGATIVE 07/01/2023 1816   LEUKOCYTESUR NEGATIVE 07/01/2023 1816      US  RENAL Result Date: 09/09/2023 CLINICAL DATA:  Acute kidney injury. EXAM: RENAL / URINARY TRACT ULTRASOUND COMPLETE COMPARISON:  CT scan abdomen and pelvis from 09/08/2023. FINDINGS: The technologist noted technically limited exam due to immobility/ventilator. Right Kidney: Renal measurements: 5.3 x 5.6 x 13.8 cm. = volume: 214.2 mL. Renal cortical echogenicity is within normal limits. No hydronephrosis. There is a single simple cyst in the interpolar region. Left Kidney: Renal measurements: 5.7 x 7.9 x 11.9 cm. =  volume: 280.3 mL. Renal cortical echogenicity is within normal limits. No hydronephrosis. There are at least 2 simple cysts. Bladder: Decompressed by Foley catheter and not well evaluated on this exam. Other: Mild perihepatic ascites noted. IMPRESSION: *Bilateral simple renal cysts. *Mild perihepatic ascites noted. *Otherwise grossly unremarkable exam. Electronically Signed   By: Ree Molt M.D.   On: 09/09/2023 11:10   DG Chest Port 1 View Result Date: 09/08/2023 CLINICAL DATA:  Tracheostomy in place. EXAM: PORTABLE CHEST 1 VIEW COMPARISON:  Chest radiograph dated 09/05/2023. FINDINGS: Tracheostomy with tip approximately 3.5 cm above the carina. Right IJ central venous line with tip over central SVC. Bilateral confluent pulmonary opacities and bibasilar consolidative changes. Overall progression of confluent opacity since the prior radiograph. Small bilateral pleural effusions suspected. No pneumothorax. Stable cardiac silhouette. No acute osseous pathology. IMPRESSION: 1. Tracheostomy with tip approximately 3.5 cm above the carina. 2. Progression of bilateral confluent pulmonary opacities and bibasilar consolidative changes. Electronically Signed   By: Vanetta Chou M.D.   On: 09/08/2023 17:29   CT ABDOMEN PELVIS WO CONTRAST Result Date: 09/08/2023 CLINICAL DATA:  Retroperitoneal bleed suspected, weakness, abnormal labs EXAM: CT ABDOMEN AND PELVIS WITHOUT CONTRAST TECHNIQUE: Multidetector CT imaging of the abdomen and pelvis was performed following the standard protocol without IV contrast. RADIATION DOSE REDUCTION: This exam was performed according to the departmental dose-optimization program which includes automated exposure control, adjustment of the mA and/or kV according to patient size and/or use of iterative reconstruction technique. COMPARISON:  CT 08/12/2023 FINDINGS: Lower chest: Diffuse airspace opacities in the lower lungs. Small bilateral pleural effusions. Hepatobiliary: Unremarkable  noncontrast appearance of the liver. Cholecystectomy. No biliary dilation. Pancreas: Unremarkable. Spleen: Unremarkable. Adrenals/Urinary Tract: Stable adrenal glands. No urinary calculi or hydronephrosis. Decompressed bladder about the Foley catheter with locules of gas in the bladder lumen. Mild perivesical fat stranding. Stomach/Bowel: Rectal tube. Normal caliber large and small bowel. Normal caliber large and small bowel. Colonic diverticulosis without diverticulitis. Mildly hyperdense liquid stool in the colon. This is nonspecific but hemorrhage is not excluded. Correlation with hemoccult is recommended. Gastrostomy tube. Distended stomach. Postoperative change of partial colectomy with ileocolonic anastomosis in the right upper quadrant. Vascular/Lymphatic: Aortic atherosclerosis. No enlarged abdominal or pelvic lymph nodes. Reproductive: Unremarkable. Other: Body wall anasarca. Marked scrotal edema. No free intraperitoneal air. No retroperitoneal hematoma. Musculoskeletal: No acute fracture. IMPRESSION: 1. No retroperitoneal hematoma. 2. Mildly hyperdense liquid stool in the colon. This is nonspecific but blood products are not excluded. Correlation with hemoccult is recommended. 3. Multifocal pneumonia, edema, and/or aspiration in the lower lungs with small bilateral pleural effusions. 4. Decompressed bladder about a Foley catheter. Perivesical stranding can be seen with cystitis. 5. Body wall anasarca. Marked scrotal edema. Aortic Atherosclerosis (ICD10-I70.0). Electronically Signed   By: Norman Gatlin M.D.   On: 09/08/2023 02:26    Review of Systems  Unable to perform ROS: Intubated   Blood pressure (!) 108/50, pulse 74, temperature 97.7 F (36.5 C), temperature source Axillary, resp. rate (!) 33, height 6' 0.99 (1.854 m), weight 108.7 kg, SpO2 91%. Physical Exam Vitals and nursing note reviewed.  Constitutional:      Appearance: He is normal weight. He is ill-appearing.     Comments:  Unresponsive, on ventilator via tracheostomy  HENT:     Head: Normocephalic and atraumatic.     Nose: Nose normal.     Mouth/Throat:     Mouth: Mucous membranes are dry.  Eyes:     General: No scleral icterus. Neck:     Comments: With recently placed tracheostomy that has a bloodstained dressing, connected to ventilator Cardiovascular:     Rate and Rhythm: Normal rate and regular rhythm.     Pulses: Normal pulses.     Heart sounds: Murmur heard.     Comments: 3/6 systolic outflow murmur Pulmonary:     Breath sounds: Rhonchi present. No wheezing.     Comments: Coarse/transmitted breath sounds bilaterally Abdominal:     Palpations: Abdomen is soft.  Tenderness: There is no abdominal tenderness.  Musculoskeletal:     Cervical back: Normal range of motion.     Right lower leg: Edema present.     Left lower leg: Edema present.     Comments: 2+ edema over lower extremities, 2-3+ edema upper extremities  Skin:    General: Skin is warm and dry.     Coloration: Skin is pale.     Assessment/Plan: 1.  Acute kidney injury: Anuric versus oliguric.  Suspected to be ischemic ATN based on available data/sequence of events.  I will check a urinalysis/urine electrolytes to corroborate clinical suspicion.  Will attempt diuretic challenge with albumin /Lasix  today.  If urine output does not improve or hyperkalemia worsens, he will need to initiate CRRT (discussed with wife who is in agreement). Avoid nephrotoxic medications including NSAIDs and iodinated intravenous contrast exposure unless the latter is absolutely indicated.  Preferred narcotic agents for pain control are hydromorphone , fentanyl , and methadone. Morphine should not be used. Avoid Baclofen and avoid oral sodium phosphate  and magnesium  citrate based laxatives / bowel preps. Continue strict Input and Output monitoring. Will monitor the patient closely with you and intervene or adjust therapy as indicated by changes in clinical  status/labs. 2.  Anemia: Without overt blood loss and recent CT scan of the abdomen/pelvis negative for retroperitoneal bleed.  Concerning to see that he has thrombocytopenia as well but reassuring to see (currently) a normal bilirubin level.  Will check LDH and haptoglobin to rule out hemolytic process. 3.  Acute on chronic hypercarbic/hypoxic respiratory failure: Ventilator and tracheostomy dependent for the past 2 months.  Current picture complicated by Serratia healthcare associated pneumonia/ARDS.  Antimicrobial coverage with cefepime . 4.  Paroxysmal atrial fibrillation: Appears to be currently in sinus rhythm with normal rate. 5.  Anasarca: Attempt diuretic challenge and consider CRRT if unsuccessful.  Gordy MARLA Blanch 09/09/2023, 1:03 PM

## 2023-09-10 ENCOUNTER — Inpatient Hospital Stay (HOSPITAL_COMMUNITY): Payer: Medicare Other

## 2023-09-10 DIAGNOSIS — J9601 Acute respiratory failure with hypoxia: Secondary | ICD-10-CM | POA: Diagnosis not present

## 2023-09-10 LAB — RENAL FUNCTION PANEL
Albumin: 3.1 g/dL — ABNORMAL LOW (ref 3.5–5.0)
Albumin: 2.9 g/dL — ABNORMAL LOW (ref 3.5–5.0)
Anion gap: 15 (ref 5–15)
Anion gap: 15 (ref 5–15)
BUN: 104 mg/dL — ABNORMAL HIGH (ref 8–23)
BUN: 101 mg/dL — ABNORMAL HIGH (ref 8–23)
CO2: 35 mmol/L — ABNORMAL HIGH (ref 22–32)
CO2: 33 mmol/L — ABNORMAL HIGH (ref 22–32)
Calcium: 9.5 mg/dL (ref 8.9–10.3)
Calcium: 9 mg/dL (ref 8.9–10.3)
Chloride: 93 mmol/L — ABNORMAL LOW (ref 98–111)
Chloride: 93 mmol/L — ABNORMAL LOW (ref 98–111)
Creatinine, Ser: 3.61 mg/dL — ABNORMAL HIGH (ref 0.61–1.24)
Creatinine, Ser: 3.54 mg/dL — ABNORMAL HIGH (ref 0.61–1.24)
GFR, Estimated: 17 mL/min — ABNORMAL LOW (ref >60)
GFR, Estimated: 18 mL/min — ABNORMAL LOW (ref >60)
Glucose, Bld: 118 mg/dL — ABNORMAL HIGH (ref 70–99)
Glucose, Bld: 144 mg/dL — ABNORMAL HIGH (ref 70–99)
Phosphorus: 8.9 mg/dL — ABNORMAL HIGH (ref 2.5–4.6)
Phosphorus: 8.3 mg/dL — ABNORMAL HIGH (ref 2.5–4.6)
Potassium: 5.4 mmol/L — ABNORMAL HIGH (ref 3.5–5.1)
Potassium: 5.5 mmol/L — ABNORMAL HIGH (ref 3.5–5.1)
Sodium: 143 mmol/L (ref 135–145)
Sodium: 141 mmol/L (ref 135–145)

## 2023-09-10 LAB — CBC
HCT: 22.8 % — ABNORMAL LOW (ref 39.0–52.0)
Hemoglobin: 7.1 g/dL — ABNORMAL LOW (ref 13.0–17.0)
MCH: 30.1 pg (ref 26.0–34.0)
MCHC: 31.1 g/dL (ref 30.0–36.0)
MCV: 96.6 fL (ref 80.0–100.0)
Platelets: 49 10*3/uL — ABNORMAL LOW (ref 150–400)
RBC: 2.36 MIL/uL — ABNORMAL LOW (ref 4.22–5.81)
RDW: 17.1 % — ABNORMAL HIGH (ref 11.5–15.5)
WBC: 4.4 10*3/uL (ref 4.0–10.5)
nRBC: 0 % (ref 0.0–0.2)

## 2023-09-10 LAB — GLUCOSE, CAPILLARY
Glucose-Capillary: 128 mg/dL — ABNORMAL HIGH (ref 70–99)
Glucose-Capillary: 124 mg/dL — ABNORMAL HIGH (ref 70–99)
Glucose-Capillary: 110 mg/dL — ABNORMAL HIGH (ref 70–99)
Glucose-Capillary: 113 mg/dL — ABNORMAL HIGH (ref 70–99)
Glucose-Capillary: 108 mg/dL — ABNORMAL HIGH (ref 70–99)
Glucose-Capillary: 128 mg/dL — ABNORMAL HIGH (ref 70–99)

## 2023-09-10 LAB — TRIGLYCERIDES: Triglycerides: 122 mg/dL (ref <150)

## 2023-09-10 LAB — ASPERGILLUS ANTIGEN, BAL/SERUM: Aspergillus Ag, BAL/Serum: 0.03 {index} (ref 0.00–0.49)

## 2023-09-10 LAB — HAPTOGLOBIN: Haptoglobin: 64 mg/dL (ref 32–363)

## 2023-09-10 MED ORDER — PRISMASOL BGK 4/2.5 32-4-2.5 MEQ/L REPLACEMENT SOLN
Status: DC
  Filled 2023-09-10 (×12): qty 5000

## 2023-09-10 MED ORDER — PROSOURCE TF20 ENFIT COMPATIBL EN LIQD
60.0000 mL | Freq: Four times a day (QID) | ENTERAL | Status: DC
  Administered 2023-09-10 – 2023-09-15 (×19): 60 mL
  Filled 2023-09-10 (×17): qty 60

## 2023-09-10 MED ORDER — HEPARIN SODIUM (PORCINE) 1000 UNIT/ML DIALYSIS
1000.0000 [IU] | INTRAMUSCULAR | Status: DC | PRN
  Administered 2023-09-14: 3000 [IU] via INTRAVENOUS_CENTRAL
  Administered 2023-09-15: 2400 [IU] via INTRAVENOUS_CENTRAL
  Filled 2023-09-10: qty 3
  Filled 2023-09-10: qty 6
  Filled 2023-09-10: qty 3
  Filled 2023-09-10: qty 6

## 2023-09-10 MED ORDER — SODIUM CHLORIDE 0.9 % IV SOLN
2.0000 g | Freq: Two times a day (BID) | INTRAVENOUS | Status: AC
  Administered 2023-09-10 – 2023-09-11 (×3): 2 g via INTRAVENOUS
  Filled 2023-09-10 (×3): qty 12.5

## 2023-09-10 MED ORDER — PRISMASOL BGK 0/2.5 32-2.5 MEQ/L EC SOLN
Status: DC
  Filled 2023-09-10 (×15): qty 5000

## 2023-09-10 MED ORDER — PRISMASOL BGK 4/2.5 32-4-2.5 MEQ/L EC SOLN
Status: DC
  Filled 2023-09-10 (×9): qty 5000

## 2023-09-10 MED ORDER — PRISMASOL BGK 4/2.5 32-4-2.5 MEQ/L REPLACEMENT SOLN
Status: DC
  Filled 2023-09-10 (×13): qty 5000

## 2023-09-10 NOTE — Progress Notes (Addendum)
     Brief progress note:  Chart reviewed. Discussed with PCCM/Dr. Isaiah.  He has discussed with family that prognosis is poor in the setting of multi-organ failure.  At this time, goals are clear for continued full code and full scope of treatment.  Family remains open to all available and offered medical interventions to prolong life.    Palliative care is following for needs and support. Please contact our team at [phone] if there are immediate needs or patient's condition changes.     Joshua Loll, NP-C Palliative Medicine   For individual providers please see AMION.   No charge

## 2023-09-10 NOTE — Procedures (Signed)
 Central Venous Catheter Insertion Procedure Note  Joshua Eaton  [id number]  1956/03/26  Date:09/10/23  Time:11:18 AM   Provider Performing:Davante Gerke E  Adolph   Procedure: Insertion of Non-tunneled Central Venous Catheter(36556)with US  guidance (23062)    Indication(s) Hemodialysis  Consent Risks of the procedure as well as the alternatives and risks of each were explained to the patient and/or caregiver.  Consent for the procedure was obtained and is signed in the bedside chart  Anesthesia Topical only with 1% lidocaine    Timeout Verified patient identification, verified procedure, site/side was marked, verified correct patient position, special equipment/implants available, medications/allergies/relevant history reviewed, required imaging and test results available.  Sterile Technique Maximal sterile technique including full sterile barrier drape, hand hygiene, sterile gown, sterile gloves, mask, hair covering, sterile ultrasound probe cover (if used).  Procedure Description Area of catheter insertion was cleaned with chlorhexidine  and draped in sterile fashion.   With real-time ultrasound guidance a HD catheter was placed into the left femoral vein.  Nonpulsatile blood flow and easy flushing noted in all ports.  The catheter was sutured in place and sterile dressing applied.   Complications/Tolerance None; patient tolerated the procedure well. Chest X-ray is ordered to verify placement for internal jugular or subclavian cannulation.  Chest x-ray is not ordered for femoral cannulation.  EBL Minimal  Specimen(s) None   Tinnie FORBES Adolph, PA-C [facility] 09/10/23 11:19 AM  Please see [email or web address] for pager details.  From 7A-7P if no response, please call [phone] After hours, please call ELink [phone]

## 2023-09-10 NOTE — Progress Notes (Addendum)
 Orthopedic Tech Progress Note Patient Details:  Joshua Eaton Mar 16, 1956 [id number]  BLE UB applied Ortho Devices Type of Ortho Device: Radio broadcast assistant Ortho Device/Splint Location: BLE Ortho Device/Splint Interventions: Ordered, Application, Adjustment   Post Interventions Patient Tolerated: Well Instructions Provided: Care of device, Adjustment of device  [name] 09/10/2023, 10:20 AM

## 2023-09-10 NOTE — Progress Notes (Signed)
 PT Cancellation Note  Patient Details Name: Joshua Eaton MRN: [id number] DOB: 09/20/1955   Cancelled Treatment:    Reason Eval/Treat Not Completed: Patient not medically ready  Pt has been unable to tolerate PT this week. Noted renal failure and starting CRRT. Will sign-off for now. Please re-order PT when he will benefit and is able to participate.  Thanks,  Joshua Eaton, PT, DPT [facility]  Rehabilitation Services Physical Therapist Office: [phone] Website: [email or web address]   Joshua Eaton 09/10/2023, 12:59 PM

## 2023-09-10 NOTE — IPAL (Signed)
  Interdisciplinary Goals of Care Family Meeting   Date carried out: 09/10/2023  Location of the meeting: Bedside  Member's involved: Physician, Bedside Registered Nurse, and Family Member or next of kin    GOALS OF CARE DISCUSSION  The Clinical status was relayed to family in detail-  Updated and notified of patients medical condition- Patient remains unresponsive and will not open eyes to command.   Patient is having a weak cough and struggling to remove secretions.   Patient with increased WOB and using accessory muscles to breathe Explained to family course of therapy and the modalities   Patient with Progressive multiorgan failure with a very high probablity of a very minimal chance of meaningful recovery despite all aggressive and optimal medical therapy.  PATIENT REMAINS FULL CODE  Family understands the situation. Patient with severe multiorgan failure Prognosis is poor, I explained to wife that patient has very low chance of meaningful recovery and will not be coming home  Family are satisfied with Plan of action and management. All questions answered  Additional CC time 25 mins   Jauna Raczynski Alm Cellar, M.D.  [facility]  Medical Director [facility] Medical Director [facility] Cardio-Pulmonary Department

## 2023-09-10 NOTE — Progress Notes (Signed)
 PHARMACY NOTE:  ANTIMICROBIAL RENAL DOSAGE ADJUSTMENT  Current antimicrobial regimen includes a mismatch between antimicrobial dosage and estimated renal function.  As per policy approved by the Pharmacy & Therapeutics and Medical Executive Committees, the antimicrobial dosage will be adjusted accordingly.  Current antimicrobial dosage:  Cefepime  2g IV every 24 hours  Indication: Serratia pneumonia  Renal Function: *Currently not producing urine  Estimated Creatinine Clearance: 25 mL/min (A) (by C-G formula based on SCr of 3.61 mg/dL (H)). []      On intermittent HD, scheduled: [x]      On CRRT - starting today    Antimicrobial dosage has been changed to:  Adjust to Cefepime  2g IV every 12 hours - next dose this PM, to end 1/11.   Additional comments: End date 1/11.    Thank you for allowing pharmacy to be a part of this patient's care.  Harlene Boga, PharmD, BCPS, BCCCP Please refer to [facility] for [facility] numbers 09/10/2023 10:08 AM

## 2023-09-10 NOTE — Progress Notes (Signed)
 [facility] Progress Note  Name: Joshua Eaton MRN: [id number] DOB: 1956/01/26   Subjective:  Nephrology was consulted on 1/9.  He has been anuric over 1/9.  Spoke with his wife at bedside.  Discussed the risks/benefits/indications for renal replacement therapy and she consented for RRT.     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  EGD revealed erosion around his PEG tube site with a cratered ulcer that was not actively bleeding.  He required tracheostomy exchange to address hemoptysis secondary to malpositioning with improvement following new tracheostomy placement.  Last week, he unfortunately suffered tracheostomy plugging progressing onto bradycardia/PEA arrest with ROSC following 3 minutes of CPR during which the tracheostomy was removed and the patient intubated.  Yesterday underwent replacement of tracheostomy after efforts to transfer him to a tertiary center for tracheopexy were unsuccessful.  Nephrology is consulted because of a progressive rise of creatinine over the last 3 days (prior to 1/6 his creatinine ranged 1.0-1.2) now with a creatinine of 2.9 with a BUN of 92.  Urine output appears to be in the anuric versus oliguric range without response to furosemide  2 days ago.  Arterial blood gas indicates respiratory acidosis with pCO2 of 77, pH 7.29 and serum bicarbonate of 33.  He has had some transient episodes of hypotension and transiently was on pressors last week following cardiac arrest.  No recent iodinated intravenous contrast exposure.  Wife available  at bedside supplements the above history and understands the possibility that he may require renal replacement therapy.     Intake/Output Summary (Last 24 hours) at 09/10/2023 0757 Last data filed at 09/10/2023 0500 Gross per 24 hour  Intake 3425.82 ml  Output 5 ml  Net 3420.82 ml    Vitals:  Vitals:   09/10/23 0630 09/10/23 0700 09/10/23 0730 09/10/23 0754  BP: (!) 98/49 (!) 100/47    Pulse: 78 80 81   Resp: 20 (!) 27 20   Temp:    (!) 97.1 F (36.2 C)  TempSrc:    Axillary  SpO2: 99% 99% 99%   Weight:      Height:         Physical Exam:  General elderly male in bed in no acute distress HEENT normocephalic atraumatic extraocular movements intact sclera anicteric Neck supple trach in place Lungs coarse mechanical breath sounds; FIO2 80% and PEEP 12 Heart S1S2 no rub Abdomen soft nontender nondistended; PEG in place Extremities 1-2+ edema bilateral lower extremities  Neuro - sedation currently running; does not follow commands   Medications reviewed   Labs:     Latest Ref Rng & Units 09/10/2023    4:50 AM 09/09/2023    3:29 PM 09/09/2023    8:06 AM  BMP  Glucose 70 - 99 mg/dL 881     BUN 8 - 23 mg/dL 895     Creatinine 9.38 - 1.24 mg/dL 6.38     Sodium 864 - 854 mmol/L 143  141  140   Potassium 3.5 - 5.1 mmol/L 5.4  5.0  5.1   Chloride 98 - 111 mmol/L 93     CO2 22 - 32 mmol/L 35  Calcium 8.9 - 10.3 mg/dL 9.5        Assessment/Plan:   1.  Acute kidney injury:  Anuric versus oliguric. Suspected to be ischemic ATN.  Renal with normal echogenicity and no hydro  - Initiate CRRT (nephrology previously discussed with wife who is in agreement).   - anuric - cannot obtain urine studies  - will check ANA, ANCA, complement, anti-GBM  2.  Normocytic Anemia:  - Without overt blood loss and recent CT scan of the abdomen/pelvis negative for retroperitoneal bleed.  Concerning to see that he has thrombocytopenia as well but reassuring to see (currently) a normal bilirubin  level.  LDH is elevated, nonspecific - Note earlier an EGD revealed erosion around his PEG tube site with a cratered ulcer that was not actively bleeding - haptoglobin is pending  - would consider hem/onc consult per primary team discretion   3.  Acute on chronic hypercarbic/hypoxic respiratory failure:  - Ventilator and tracheostomy dependent for the past 2 months.  Current picture complicated by Serratia healthcare associated pneumonia/ARDS.   - Antimicrobial coverage with cefepime .  If mental status is a concern would transition to an alternate regimen.  Note that per pharmacy this ends tomorrow so is less of a concern    4.  Paroxysmal atrial fibrillation:  - per primary team   5.  Anasarca: failed diuretic; optimize volume as above with CRRT   Disposition - continue ICU monitoring, starting CRRT     Katheryn [name], MD 09/10/2023 8:44 AM

## 2023-09-10 NOTE — Progress Notes (Signed)
 NAME:  Joshua Eaton, MRN:  [id number], DOB:  05/02/1956, LOS: 27 ADMISSION DATE:  08/13/2023, CONSULTATION DATE:  12/13 REFERRING MD:  Dr. Yolande,   CHIEF COMPLAINT:  GIB   History of Present Illness:  Patient is a 68 yo M w/ pertinent PMH chronic respiratory failure w/ tracheostomy/[facility], Afib on eliquis  presents to [facility] ED on 12/13 w/ GIB.  Patient recently admitted w/ respiratory failure and pna in October 2024 requiring tracheostomy. [facility]. Later that day had an accidental trach dislodgement. His wob continued to increase requiring re-admission to [facility] on 10/31 and was intubated and later trached during hospital admission. [facility]   On 12/13 patient admitted to [facility] w/ drop in hgb and concern for GIB. GI consulted for possible endoscopy. TRH to admit and PCCM to consult for vent management.  Pertinent  Medical History   Past Medical History:  Diagnosis Date   Anxiety    Cancer (HCC)    Depression    Hypertension    OCD (obsessive compulsive disorder)    Significant Hospital Events: Including procedures, antibiotic start and stop dates in addition to other pertinent events   12/13 admitted w/ GIB. Pccm to consult for vent management 12/14 1 unit pRBC 12/15 EGD - erosion around PEG site but no active bleeding; cratered ulcer biopsied near peg site.  12/16 plugging with clots- posterior wall bleeding from distal end of trach. Hemoptysis secondary to tracheostomy malpositioning.  This is resolved. Patient had originally been on IV heparin  for DVT.  Had tracheostomy in place, and developed fairly significant hemoptysis back on 12/16.  Bronchoscopy identified the distal tip of the ostomy abutting the posterior wall of the trachea with small amount of ulceration.  He received TXA, heavy sedation, anticoagulation was held. 12/23 family fired pccm 12/24 pccm asked to  re-engage by ENT via RN staff .  12/29 # 7 adjustable length cuffed biovona placed set at 110 mm wife updated  12/30 Bedside bronchoscopy with BAL of RML 12/31 Worsening status with difficulty to ventilate, sedated, paralyzed, trach repositioned.  Started meropenem  for Serratia HAP 1/1 follow up for  biovona adjustable trach, still could not achieve optimal positioning with distal tip at posterior wall.  Ultimately required #6 proximal XLT Brovona changed trach to 6 prox xlt. Excellent mid-line positioning. Improved gas exchange.  1/2 was doing well. NMB discontinued. CXR was looking better. We were escalating diuretics. Had sudden desaturation, Nursing reports large plugs removed but remained hypoxic. RT was NOT able to pass suction cath when they arrived to assist. Also not able to provide manual BMV.  This progressed to bradycardic and then PEA cardiac arrest. Had 1 round ~ 3 minutes CPR. During this time Trach removed (as could not pass) intubated w/ # 7.5 ETT from above.  1/3  Weaning pressors. No events overnight  1/4 Dr. Geronimo spoke to Dr Maximino of [facility] at [facility]- says no beds in [place] or [place] and declined transfer + lateral transfer -> they also called back later and said they are removing [name] from their list and for us  to call back in  >1 weeks to see if bed available but they are not considering [name] for acceptance  1/5 No acute issues overnight, remains on fent and prop but off pressors.  1/7 remains intubated/sedated; drop in hgb requiring 2u PRBCs; [facility] denied transfer for tracheopexy procedure; family meeting  discussed and plan on trach tomorrow 1/8 yesterday trached requiring 8 bovana; 8 shiley and 8 proximal xlt placed but significant cuff leak 1/9 severe hypoxia 1/10 severe hypoxia, progressive renal failure  Interim History / Subjective:  Vent Mode: PRVC FiO2 (%):  [80 %-100 %] 80 % Set Rate:  [30 bmp] 30 bmp Vt Set:  [470 mL] 470 mL PEEP:  [8  cmH20-12 cmH20] 12 cmH20 Plateau Pressure:  [39 cmH20-42 cmH20] 39 cmH20 Remains critically ill Severe multiorgan failure  Objective   Blood pressure (!) 100/47, pulse 81, temperature (!) 97.1 F (36.2 C), temperature source Axillary, resp. rate (!) 30, height 6' 0.99 (1.854 m), weight 108.7 kg, SpO2 99%.    Vent Mode: PRVC FiO2 (%):  [80 %-100 %] 80 % Set Rate:  [30 bmp] 30 bmp Vt Set:  [470 mL] 470 mL PEEP:  [8 cmH20-12 cmH20] 12 cmH20 Plateau Pressure:  [39 cmH20-42 cmH20] 39 cmH20   Intake/Output Summary (Last 24 hours) at 09/10/2023 0924 Last data filed at 09/10/2023 0500 Gross per 24 hour  Intake 962.67 ml  Output 5 ml  Net 957.67 ml   Filed Weights   09/06/23 0500 09/07/23 0233 09/08/23 0500  Weight: 107.8 kg 109.6 kg 108.7 kg      REVIEW OF SYSTEMS  PATIENT IS UNABLE TO PROVIDE COMPLETE REVIEW OF SYSTEMS DUE TO SEVERE CRITICAL ILLNESS   PHYSICAL EXAMINATION:  GENERAL:critically ill appearing, +resp distress EYES: Pupils equal, round, reactive to light.  No scleral icterus.  MOUTH: Moist mucosal membrane. trach NECK: Supple.  PULMONARY: Lungs clear to auscultation, +rhonchi CARDIOVASCULAR: S1 and S2.  Regular rate and rhythm GASTROINTESTINAL: Soft, nontender, -distended. Positive bowel sounds.  MUSCULOSKELETAL: +edema.  NEUROLOGIC: obtunded,sedated SKIN:normal, warm to touch, Capillary refill delayed  Pulses present bilaterally     Resolved Hospital Problem list   Hypokalemia HTN AKI Hemoptysis (resolved) secondary to sxn trauma  Hyperbilirubinemia Petechial rash resolved Rash on knee-- treated for  ringworm w/ antifungal X 2 weeks Post cardiac arrest shock. 1/2  -Cardiac arrest precipitated by airway obstruction, ? mucous plugging, with residual hypotension 2/2 sedating meds  Assessment & Plan:  Acute on chronic hypoxic and hypercarbic respiratory failure with trach/vent dependence since 06/2023 Serratia HAP w/ ARDS  Tracheomalacia  -Largely  driven by severe neuromuscular weakness but also due to intermittent infections, pneumonias. Currently with Serratia.  - trach out 1/2 due to obstruction/ inability to ventilate -1/8 re-trached w/ 8 bovana; 8 shiley and 8 proximal xlt attempted but significant cuff leak  Severe ACUTE Hypoxic and Hypercapnic Respiratory Failure -continue Mechanical Ventilator support -Wean Fio2 and PEEP as tolerated -VAP/VENT bundle implementation - Wean PEEP & FiO2 as tolerated, maintain SpO2 > 88% - Head of bed elevated 30 degrees, VAP protocol in place - Plateau pressures less than 30 cm H20  - Intermittent chest x-ray & ABG PRN - Ensure adequate pulmonary hygiene  -will perform SAT/SBT when respiratory parameters are met  ACUTE KIDNEY INJURY/Renal Failure -continue Foley Catheter-assess need -Avoid nephrotoxic agents -Follow urine output, BMP -Ensure adequate renal perfusion, optimize oxygenation -Renal dose medications Plan to start CRRT   Intake/Output Summary (Last 24 hours) at 09/10/2023 9072 Last data filed at 09/10/2023 0500 Gross per 24 hour  Intake 962.67 ml  Output 5 ml  Net 957.67 ml   CARDIAC 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  Paroxysmal Afib Fluid overload -off AC w/ hx of GIB and continual drop in hgb when restarted ac; cont to hold ac today  and consider resuming tomorrow -amio -minimal UOP; if continues to have low UOP and rising creat may need HD for volume removal   ABLA 2/2 UGIB/ duodenitis, now more anemia of critical illness Thrombocytopenia, stable, suspected 2/2 sepsis CBC    Component Value Date/Time   WBC 4.4 09/10/2023 0450   RBC 2.36 (L) 09/10/2023 0450   HGB 7.1 (L) 09/10/2023 0450   HCT 22.8 (L) 09/10/2023 0450   PLT 49 (L) 09/10/2023 0450   MCV 96.6 09/10/2023 0450   MCH 30.1 09/10/2023 0450   MCHC 31.1 09/10/2023 0450   RDW 17.1 (H) 09/10/2023 0450   LYMPHSABS 0.8 08/26/2023 0454   MONOABS 0.6 08/26/2023 0454   EOSABS 0.5 08/26/2023  0454   BASOSABS 0.0 08/26/2023 0454  Consider Transfusion-discussed with wife and asking about giving transfusion   LUE provoked DVT from picc line Difficult IV access  -PICC line was removed back on 12/14 from the left upper extremity.  US  neg for RUE DVT Remains off systemic AC given prior bleeding issues.  Avoid PICC in LUE.  CVL since 12/14, BC neg from 12/23. Supportive care Avoid AC at this time, PLT count at 49   NEUROLOGY ACUTE METABOLIC ENCEPHALOPATHY -need for sedation -Goal RASS -2 to -3 At some point, we will need to consider MRI brain to assess for CVA  Unstagable pressure ulcer on sacrum POA -WOC following; appreciate recs -surgery following; appreciate recs    ENDO - ICU hypoglycemic\Hyperglycemia protocol -check FSBS per protocol   GI GI PROPHYLAXIS as indicated  NUTRITIONAL STATUS DIET-->TF's as tolerated Constipation protocol as indicated   ELECTROLYTES -follow labs as needed -replace as needed -pharmacy consultation and following   Discharge planning -remains difficult situation.  Ultimately needs surgery for his valvular disease however his degree of deconditioning, malnutrition, and ventilator dependence continue to be a barrier.  Not a surgical candidate back in October and certainly not now.  Will need extensive rehabilitation efforts and unclear if liberation from mechanical ventilation is an option.  Family continue to request transfer.  Denied 1/2 by [facility] and [facility]. Denied again by [facility] on 1/7. Remains on wait list for [facility]. Denied at [facility].  I do not think care plan will change regardless of location.   Best Practice (right click and Reselect all SmartList Selections daily)   Diet/type: tubefeeds DVT prophylaxis SCDs Pressure ulcer(s): present on admission  GI prophylaxis: PPI Lines: Central line Foley:  Yes, and it is still needed Code Status:  full code Last date of multidisciplinary goals of care discussion Wife  updated at bedside 1/9     DVT/GI PRX  assessed I Assessed the need for Labs I Assessed the need for Foley I Assessed the need for Central Venous Line Family Discussion when available I Assessed the need for Mobilization I made an Assessment of medications to be adjusted accordingly Safety Risk assessment completed  CASE DISCUSSED IN MULTIDISCIPLINARY ROUNDS WITH ICU TEAM     Critical Care Time devoted to patient care services described in this note is 45 minutes.  Critical care was necessary to treat /prevent imminent and life-threatening deterioration. Overall, patient is critically ill, prognosis is guarded.  Patient with Multiorgan failure and at high risk for cardiac arrest and death.    Nickolas Alm Cellar, M.D.  [facility]  Medical Director [facility] Medical Director [facility] Cardio-Pulmonary Department

## 2023-09-10 NOTE — Progress Notes (Signed)
 Nutrition Brief Note  Consult received as part of CRRT order set. RD full not performed yesterday. RN endorses tolerance of current bolus TF regimen.   Will increase protein needs slightly to account for losses and increased needs secondary to CRRT. Plan to increase frequency of ProSource TF to QID to best meet these needs.   New estimated nutrition needs: Kcal: 2200- 2400 Protein: 165-180g Fluids: 1L + UOP  Updated TF regimen: 6 cartons Jevity 1.5 daily 60ml Prosource TF20 BID Provides 2450 kcal, 171g protein and free water  daily  Vitamin/micronutrient labs results: Iron: 40 (L) Ferritin 2200 (H) Vitamin B12 802 (wdl) Zinc  64 (wdl) Vitamin A  pending Vitamin C  pending  Royce Maris, RDN, LDN Clinical Nutrition

## 2023-09-11 ENCOUNTER — Inpatient Hospital Stay (HOSPITAL_COMMUNITY): Payer: Medicare Other

## 2023-09-11 DIAGNOSIS — J9601 Acute respiratory failure with hypoxia: Secondary | ICD-10-CM | POA: Diagnosis not present

## 2023-09-11 DIAGNOSIS — R609 Edema, unspecified: Secondary | ICD-10-CM

## 2023-09-11 LAB — CBC
HCT: 22.9 % — ABNORMAL LOW (ref 39.0–52.0)
Hemoglobin: 7 g/dL — ABNORMAL LOW (ref 13.0–17.0)
MCH: 29.8 pg (ref 26.0–34.0)
MCHC: 30.6 g/dL (ref 30.0–36.0)
MCV: 97.4 fL (ref 80.0–100.0)
Platelets: 59 10*3/uL — ABNORMAL LOW (ref 150–400)
RBC: 2.35 MIL/uL — ABNORMAL LOW (ref 4.22–5.81)
RDW: 17 % — ABNORMAL HIGH (ref 11.5–15.5)
WBC: 6.6 10*3/uL (ref 4.0–10.5)
nRBC: 0.3 % — ABNORMAL HIGH (ref 0.0–0.2)

## 2023-09-11 LAB — RENAL FUNCTION PANEL
Albumin: 2.7 g/dL — ABNORMAL LOW (ref 3.5–5.0)
Albumin: 2.7 g/dL — ABNORMAL LOW (ref 3.5–5.0)
Anion gap: 13 (ref 5–15)
Anion gap: 10 (ref 5–15)
BUN: 74 mg/dL — ABNORMAL HIGH (ref 8–23)
BUN: 54 mg/dL — ABNORMAL HIGH (ref 8–23)
CO2: 29 mmol/L (ref 22–32)
CO2: 27 mmol/L (ref 22–32)
Calcium: 8.9 mg/dL (ref 8.9–10.3)
Calcium: 8.8 mg/dL — ABNORMAL LOW (ref 8.9–10.3)
Chloride: 99 mmol/L (ref 98–111)
Chloride: 98 mmol/L (ref 98–111)
Creatinine, Ser: 2.62 mg/dL — ABNORMAL HIGH (ref 0.61–1.24)
Creatinine, Ser: 1.95 mg/dL — ABNORMAL HIGH (ref 0.61–1.24)
GFR, Estimated: 26 mL/min — ABNORMAL LOW (ref >60)
GFR, Estimated: 37 mL/min — ABNORMAL LOW (ref >60)
Glucose, Bld: 128 mg/dL — ABNORMAL HIGH (ref 70–99)
Glucose, Bld: 126 mg/dL — ABNORMAL HIGH (ref 70–99)
Phosphorus: 5.1 mg/dL — ABNORMAL HIGH (ref 2.5–4.6)
Phosphorus: 3.3 mg/dL (ref 2.5–4.6)
Potassium: 4.6 mmol/L (ref 3.5–5.1)
Potassium: 4.1 mmol/L (ref 3.5–5.1)
Sodium: 141 mmol/L (ref 135–145)
Sodium: 135 mmol/L (ref 135–145)

## 2023-09-11 LAB — POCT I-STAT 7, (LYTES, BLD GAS, ICA,H+H)
Acid-Base Excess: 3 mmol/L — ABNORMAL HIGH (ref 0.0–2.0)
Acid-Base Excess: 2 mmol/L (ref 0.0–2.0)
Bicarbonate: 32.6 mmol/L — ABNORMAL HIGH (ref 20.0–28.0)
Bicarbonate: 28.9 mmol/L — ABNORMAL HIGH (ref 20.0–28.0)
Calcium, Ion: 1.22 mmol/L (ref 1.15–1.40)
Calcium, Ion: 1.21 mmol/L (ref 1.15–1.40)
HCT: 25 % — ABNORMAL LOW (ref 39.0–52.0)
HCT: 33 % — ABNORMAL LOW (ref 39.0–52.0)
Hemoglobin: 8.5 g/dL — ABNORMAL LOW (ref 13.0–17.0)
Hemoglobin: 11.2 g/dL — ABNORMAL LOW (ref 13.0–17.0)
O2 Saturation: 98 %
O2 Saturation: 86 %
Patient temperature: 97.1
Patient temperature: 97.6
Potassium: 4.3 mmol/L (ref 3.5–5.1)
Potassium: 4 mmol/L (ref 3.5–5.1)
Sodium: 138 mmol/L (ref 135–145)
Sodium: 137 mmol/L (ref 135–145)
TCO2: 35 mmol/L — ABNORMAL HIGH (ref 22–32)
TCO2: 31 mmol/L (ref 22–32)
pCO2 arterial: 83.3 mm[Hg] (ref 32–48)
pCO2 arterial: 51.7 mm[Hg] — ABNORMAL HIGH (ref 32–48)
pH, Arterial: 7.196 — CL (ref 7.35–7.45)
pH, Arterial: 7.354 (ref 7.35–7.45)
pO2, Arterial: 141 mm[Hg] — ABNORMAL HIGH (ref 83–108)
pO2, Arterial: 54 mm[Hg] — ABNORMAL LOW (ref 83–108)

## 2023-09-11 LAB — GLUCOSE, CAPILLARY
Glucose-Capillary: 122 mg/dL — ABNORMAL HIGH (ref 70–99)
Glucose-Capillary: 116 mg/dL — ABNORMAL HIGH (ref 70–99)
Glucose-Capillary: 123 mg/dL — ABNORMAL HIGH (ref 70–99)
Glucose-Capillary: 136 mg/dL — ABNORMAL HIGH (ref 70–99)
Glucose-Capillary: 134 mg/dL — ABNORMAL HIGH (ref 70–99)
Glucose-Capillary: 119 mg/dL — ABNORMAL HIGH (ref 70–99)

## 2023-09-11 LAB — CORTISOL: Cortisol, Plasma: 18.4 ug/dL

## 2023-09-11 LAB — HEMOGLOBIN AND HEMATOCRIT, BLOOD
HCT: 24.8 % — ABNORMAL LOW (ref 39.0–52.0)
Hemoglobin: 7.7 g/dL — ABNORMAL LOW (ref 13.0–17.0)

## 2023-09-11 LAB — PROTIME-INR
INR: 1.3 — ABNORMAL HIGH (ref 0.8–1.2)
Prothrombin Time: 16.7 s — ABNORMAL HIGH (ref 11.4–15.2)

## 2023-09-11 LAB — MAGNESIUM: Magnesium: 2.8 mg/dL — ABNORMAL HIGH (ref 1.7–2.4)

## 2023-09-11 LAB — VITAMIN C: Vitamin C: 1.9 mg/dL (ref 0.4–2.0)

## 2023-09-11 LAB — PREPARE RBC (CROSSMATCH)

## 2023-09-11 MED ORDER — ROCURONIUM BROMIDE 10 MG/ML (PF) SYRINGE
PREFILLED_SYRINGE | INTRAVENOUS | Status: AC
  Administered 2023-09-11: 80 mg via INTRAVENOUS
  Filled 2023-09-11: qty 10

## 2023-09-11 MED ORDER — ROCURONIUM BROMIDE 10 MG/ML (PF) SYRINGE
80.0000 mg | PREFILLED_SYRINGE | INTRAVENOUS | Status: DC | PRN
  Administered 2023-09-11 – 2023-09-13 (×8): 80 mg via INTRAVENOUS
  Filled 2023-09-11 (×8): qty 10

## 2023-09-11 MED ORDER — SODIUM CHLORIDE 0.9% IV SOLUTION: Freq: Once | INTRAVENOUS | Status: AC

## 2023-09-11 MED ORDER — TRANEXAMIC ACID FOR INHALATION
500.0000 mg | Freq: Three times a day (TID) | RESPIRATORY_TRACT | Status: AC
  Administered 2023-09-11 – 2023-09-13 (×5): 500 mg via RESPIRATORY_TRACT
  Filled 2023-09-11 (×6): qty 10

## 2023-09-11 MED ORDER — MIDAZOLAM-SODIUM CHLORIDE 100-0.9 MG/100ML-% IV SOLN
0.0000 mg/h | INTRAVENOUS | Status: DC
  Administered 2023-09-11: 2 mg/h via INTRAVENOUS
  Administered 2023-09-12 – 2023-09-15 (×5): 5 mg/h via INTRAVENOUS
  Filled 2023-09-11 (×6): qty 100

## 2023-09-11 MED ORDER — PRISMASOL BGK 4/2.5 32-4-2.5 MEQ/L EC SOLN
Status: DC
  Filled 2023-09-11: qty 5000
  Filled 2023-09-11: qty 15000
  Filled 2023-09-11 (×37): qty 5000

## 2023-09-11 MED ORDER — MIDAZOLAM BOLUS VIA INFUSION
0.0000 mg | INTRAVENOUS | Status: DC | PRN
Start: 2023-09-11 — End: 2023-09-16
  Administered 2023-09-11 – 2023-09-12 (×7): 2 mg via INTRAVENOUS
  Administered 2023-09-15 (×2): 5 mg via INTRAVENOUS

## 2023-09-11 MED ORDER — NOREPINEPHRINE 16 MG/250ML-% IV SOLN
0.0000 ug/min | INTRAVENOUS | Status: DC
  Administered 2023-09-11: 5 ug/min via INTRAVENOUS
  Administered 2023-09-13: 3 ug/min via INTRAVENOUS
  Filled 2023-09-11 (×2): qty 250

## 2023-09-11 NOTE — Progress Notes (Addendum)
 NAME:  Joshua Eaton, MRN:  [id number], DOB:  Nov 01, 1955, LOS: 28 ADMISSION DATE:  08/13/2023, CONSULTATION DATE:  12/13 REFERRING MD:  Dr. Yolande, CHIEF COMPLAINT:  GIB   History of Present Illness:  Patient is a 68 yo M w/ pertinent PMH chronic respiratory failure w/ tracheostomy/[facility], Afib on eliquis  presents to [facility] ED on 12/13 w/ GIB.  Patient recently admitted w/ respiratory failure and pna in October 2024 requiring tracheostomy. He was later [facility]. Later that day had an accidental trach dislodgement. His wob continued to increase requiring re-admission to [facility] on 10/31 and was intubated and later trached during hospital admission. Patient discharged [facility].   On 12/13 patient admitted to [facility] w/ drop in hgb and concern for GIB. GI consulted for possible endoscopy. TRH to admit and PCCM to consult for vent management.  Pertinent  Medical History   Past Medical History:  Diagnosis Date   Anxiety    Cancer (HCC)    Depression    Hypertension    OCD (obsessive compulsive disorder)    Significant Hospital Events: Including procedures, antibiotic start and stop dates in addition to other pertinent events   12/13 admitted w/ GIB. Pccm to consult for vent management 12/14 1 unit pRBC 12/15 EGD - erosion around PEG site but no active bleeding; cratered ulcer biopsied near peg site.  12/16 plugging with clots- posterior wall bleeding from distal end of trach. Hemoptysis secondary to tracheostomy malpositioning.  This is resolved. Patient had originally been on IV heparin  for DVT.  Had tracheostomy in place, and developed fairly significant hemoptysis back on 12/16.  Bronchoscopy identified the distal tip of the ostomy abutting the posterior wall of the trachea with small amount of ulceration.  He received TXA, heavy sedation, anticoagulation was held. 12/23 family fired pccm 12/24 pccm asked to re-engage  by ENT via RN staff .  12/29 # 7 adjustable length cuffed biovona placed set at 110 mm wife updated  12/30 Bedside bronchoscopy with BAL of RML 12/31 Worsening status with difficulty to ventilate, sedated, paralyzed, trach repositioned.  Started meropenem  for Serratia HAP 1/1 follow up for  biovona adjustable trach, still could not achieve optimal positioning with distal tip at posterior wall.  Ultimately required #6 proximal XLT Brovona changed trach to 6 prox xlt. Excellent mid-line positioning. Improved gas exchange.  1/2 was doing well. NMB discontinued. CXR was looking better. We were escalating diuretics. Had sudden desaturation, Nursing reports large plugs removed but remained hypoxic. RT was NOT able to pass suction cath when they arrived to assist. Also not able to provide manual BMV.  This progressed to bradycardic and then PEA cardiac arrest. Had 1 round ~ 3 minutes CPR. During this time Trach removed (as could not pass) intubated w/ # 7.5 ETT from above.  1/3  Weaning pressors. No events overnight  1/4 Dr. Geronimo spoke to Dr Maximino of CCM at [facility]- says no beds in [place] or [place] and declined transfer + lateral transfer -> they also called back later and said they are removing [name] from their list and for us  to call back in  >1 weeks to see if bed available but they are not considering [name] for acceptance  1/5 No acute issues overnight, remains on fent and prop but off pressors.  1/7 remains intubated/sedated; drop in hgb requiring 2u PRBCs; [facility] denied transfer for tracheopexy procedure; family meeting discussed and  plan on trach tomorrow 1/8 yesterday trached requiring 8 bovana; 8 shiley and 8 proximal xlt placed but significant cuff leak 1/10 CRRT  Interim History / Subjective:  Extremely high dead space remains; trying to pull fluid to improve this. Despite MV 16, pH remains 7.1-7.2  Objective   Blood pressure (!) 96/50, pulse 78, temperature (!) 97.1 F  (36.2 C), temperature source Axillary, resp. rate (!) 27, height 6' 0.99 (1.854 m), weight 60 kg, SpO2 98%. CVP:  [15 mmHg] 15 mmHg  Vent Mode: PRVC FiO2 (%):  [70 %-80 %] 70 % Set Rate:  [30 bmp] 30 bmp Vt Set:  [470 mL] 470 mL PEEP:  [10 cmH20-12 cmH20] 10 cmH20 Plateau Pressure:  [26 cmH20-40 cmH20] 26 cmH20   Intake/Output Summary (Last 24 hours) at 09/11/2023 1146 Last data filed at 09/11/2023 1100 Gross per 24 hour  Intake 3019.6 ml  Output 3843.1 ml  Net -823.5 ml   Filed Weights   09/07/23 0233 09/08/23 0500 09/11/23 0500  Weight: 109.6 kg 108.7 kg 60 kg    Examination: Heavily sedated Very ill appearing Diffuse anasarca stable Stable ?fungal rash on anterior left leg Intermittent cuff leak depending on trach position and patient dyssynchrony with vent PEG in place RASS -4 Multiple pressure ulcers stable  H/H remains borderline Plts improved CXR continues to look bad  Resolved Hospital Problem list   Hypokalemia HTN AKI Hemoptysis (resolved) secondary to sxn trauma  Hyperbilirubinemia Petechial rash resolved Rash on knee-- treated for  ringworm w/ antifungal X 2 weeks Post cardiac arrest shock. 1/2  -Cardiac arrest precipitated by airway obstruction, ? 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  H/H drop worsens with challenges with AC. ICU  encephalopathy- may benefit from MRI at some point but suspect this is mostly related to chronic critical illness and needs for high sedation to maintain ventilator synchrony, sats, and ventilation PICC associated DVT- seems to have disappeared on f/u scan on 09/03/23; LE duplex neg 08/14/23 Paroxysmal Afib- cannot do AC yet Tracheostomy malfunction- he has developed significant granulation tissue along his anterior wall; moreover, needs fairly wide lumen to clear secretions.  Best thing we have been able to do to date to seat the distal end and entire balloon inside tracheal lumen is a 8-0 adjustable bivona.  Still having leaks with this due to the flimsy material at distal end causing migration as well as high WOB pushing air past balloon. Stage IV sacral ulcer POA- CCS and WOC have evaluated.  No good options other than optimizing nutrition, turning, and local dressing.  Can consider Dakin's.  Overall persistent multiorgan failure are concerning.  We have attempted transfer to other facilities but turned down at multiple places.  Patient has stated he would like anything and everything to prolong his life.  Have met with family multiple times and they are appreciative of us  maximizing everything we have to try to keep him with us .  Over next few days  we need to push fluid removal to hopefully improve his dead space, challenge with [facility] as I suspect a component of VTE, and eventually wean down his sedation.   Changes for today Push fluid removal, increase levo PRN up to 12mcg/min US  bilateral chest and consider drainage LE duplex; if + will get IVC filter next week Cannot really maximize ventilation anymore on vent, leave as is for now, keep heavy sedation PRN  Best Practice (right click and Reselect all SmartList Selections daily)   Diet/type: tubefeeds DVT prophylaxis SCDs Pressure ulcer(s): present on admission  GI prophylaxis: PPI Lines: Central line Foley:  Yes, and it is still  needed Code Status:  full code Last date of multidisciplinary goals of care discussion Wife updated at bedside 1/11  Critical care time: 38 minutes   Rolan Sharps MD PCCM [facility] 09/11/2023, 11:46 AM  Please see [email or web address] for pager details.  From 7A-7P if no response, please call [phone]. After hours, please call ELink [phone].

## 2023-09-11 NOTE — Progress Notes (Signed)
 VASCULAR LAB    Bilateral lower extremity venous duplex has been performed.  See CV proc for preliminary results.   Arnez Stoneking, RVT 09/11/2023, 3:55 PM

## 2023-09-11 NOTE — Progress Notes (Signed)
Tracheal aspirate sent to lab

## 2023-09-11 NOTE — Progress Notes (Signed)
 The 1000 dose of 200 mg amio given @0620  per Dr. Marcelline Deist. Also, will keep fluid removal at 0 for now.

## 2023-09-11 NOTE — Progress Notes (Signed)
 Patient placed on new bed on 09/08/2023. Per new bed, patients weight is 60 kg. Bed may have not been zeroed prior to patient. Will pass on to day shift that patient needs to be lifted out of the bed with the maxilift (over the bed lift), and bed zeroed to get an accurate weight.

## 2023-09-11 NOTE — Progress Notes (Signed)
 eLink Physician-Brief Progress Note Patient Name: Montoya Brandel DOB: 03-Jul-1956 MRN: [id number]   Date of Service  09/11/2023  HPI/Events of Note    eICU Interventions     Afib/RVR On CRRT   Will give amio dose now We will hold off pulling fluids     Marrell Dicaprio 09/11/2023, 6:06 AM

## 2023-09-11 NOTE — Progress Notes (Signed)
 [facility] Progress Note  Name: Joshua Eaton MRN: [id number] DOB: 02/12/1956   Subjective:  He was anuric over 1/10.  He was started on CRRT on 1/10 after nontunneled catheter placement with critical care.  Seen and examined on CRRT; procedure supervised.  He had 2.2 liters UF over 1/10 with CRRT.  He has been on levo at 3 mcg/min.  He is on FIO2 70% and PEEP 10.     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  EGD revealed erosion around his PEG tube site with a cratered ulcer that was not actively bleeding.  He required tracheostomy exchange to address hemoptysis secondary to malpositioning with improvement following new tracheostomy placement.  Last week, he unfortunately suffered tracheostomy plugging progressing onto bradycardia/PEA arrest with ROSC following 3 minutes of CPR during which the tracheostomy was removed and the patient intubated.  Yesterday underwent replacement of tracheostomy after efforts to transfer him to a tertiary center for tracheopexy were unsuccessful.  Nephrology is consulted because of a progressive rise of creatinine over the last 3 days (prior to 1/6 his creatinine ranged 1.0-1.2) now with a creatinine of 2.9 with a BUN of 92.  Urine output appears to be in the anuric versus oliguric range without response to furosemide  2 days ago.  Arterial blood gas indicates respiratory acidosis with pCO2 of 77, pH 7.29 and serum bicarbonate of 33.  He has had some transient episodes of hypotension and transiently was on pressors last week following  cardiac arrest.  No recent iodinated intravenous contrast exposure.  Wife available at bedside supplements the above history and understands the possibility that he may require renal replacement therapy.     Intake/Output Summary (Last 24 hours) at 09/11/2023 0918 Last data filed at 09/11/2023 0900 Gross per 24 hour  Intake 2919.99 ml  Output 3421.7 ml  Net -501.71 ml    Vitals:  Vitals:   09/11/23 0815 09/11/23 0830 09/11/23 0843 09/11/23 0845  BP: (!) 106/52 (!) 106/51  (!) 106/52  Pulse: 81 82 84 83  Resp: (!) 22 (!) 23 (!) 30 (!) 27  Temp:      TempSrc:      SpO2: 98% 98% 98% 98%  Weight:      Height:         Physical Exam:  General elderly male in bed in no acute distress HEENT normocephalic atraumatic extraocular movements intact sclera anicteric Neck supple trach in place Lungs coarse mechanical breath sounds; FIO2 70% and PEEP 10 Heart S1S2 no rub Abdomen soft nontender nondistended; PEG in place Extremities 2-3+ edema bilateral lower extremities  Neuro - sedation currently running; does not follow commands Access: left femoral nontunneled dialysis catheter     Medications reviewed   Labs:     Latest Ref Rng & Units 09/11/2023    4:05 AM 09/10/2023    5:00 PM 09/10/2023    4:50 AM  BMP  Glucose 70 - 99 mg/dL 871  855  881   BUN 8 - 23 mg/dL 74  898  895   Creatinine 0.61 - 1.24 mg/dL 7.37  6.45  6.38   Sodium 135 -  145 mmol/L 141  141  143   Potassium 3.5 - 5.1 mmol/L 4.6  5.5  5.4   Chloride 98 - 111 mmol/L 99  93  93   CO2 22 - 32 mmol/L 29  33  35   Calcium 8.9 - 10.3 mg/dL 8.9  9.0  9.5      Assessment/Plan:   1.  Acute kidney injury:  Anuric versus oliguric. Suspected to be ischemic ATN.  Renal with normal echogenicity and no hydro.  He was started on CRRT on 1/10 after nontunneled catheter with critical care   - Continue CRRT.  On 2K dialysate and rest of fluids are 4K   - Check ANA, ANCA, complement, and anti-GBM - pending  2.  Normocytic  Anemia:  - Without overt blood loss and recent CT scan of the abdomen/pelvis negative for retroperitoneal bleed.  Concerning to see that he has thrombocytopenia as well but reassuring to see (currently) a normal bilirubin level.  LDH is elevated, nonspecific - Note earlier an EGD revealed erosion around his PEG tube site with a cratered ulcer that was not actively bleeding - haptoglobin is normal   3.  Acute on chronic hypercarbic/hypoxic respiratory failure:  - Ventilator and tracheostomy dependent for the past 2 months.  Current picture complicated by Serratia healthcare associated pneumonia/ARDS.   - Antimicrobial coverage with cefepime .  If mental status is a concern would transition to an alternate regimen.  Note that per pharmacy this ends tomorrow so is less of a concern    4.  Paroxysmal atrial fibrillation:  - per primary team   5.  Anasarca: failed diuretic; optimize volume as above with CRRT   6. Hyperphosphatemia  - improving with CRRT    Disposition - continue ICU monitoring, starting CRRT     Katheryn [name], MD 09/11/2023 9:47 AM

## 2023-09-11 NOTE — Progress Notes (Signed)
 Elink RN made aware of patient converting to AFIB. Fluid removal changed to 0. Awaiting Elink MD's response. Continuing to follow patients' care plan.

## 2023-09-11 NOTE — Plan of Care (Signed)
  Problem: Activity: Goal: Ability to tolerate increased activity will improve Outcome: Not Progressing   Problem: Respiratory: Goal: Ability to maintain a clear airway and adequate ventilation will improve Outcome: Not Progressing   Problem: Role Relationship: Goal: Method of communication will improve Outcome: Not Progressing   Problem: Clinical Measurements: Goal: Respiratory complications will improve Outcome: Not Progressing   Problem: Activity: Goal: Risk for activity intolerance will decrease Outcome: Not Progressing

## 2023-09-11 NOTE — Progress Notes (Signed)
 09/11/2023 Bedside US minimal pleural effusions Going to try to go to 8cc/kg and increase sedation to see if we can get to a better spot with ventilation. Now getting MV of around 18  Myrla Halsted MD PCCM

## 2023-09-12 ENCOUNTER — Inpatient Hospital Stay (HOSPITAL_COMMUNITY): Payer: Medicare Other

## 2023-09-12 DIAGNOSIS — J9601 Acute respiratory failure with hypoxia: Secondary | ICD-10-CM | POA: Diagnosis not present

## 2023-09-12 LAB — RENAL FUNCTION PANEL
Albumin: 2.6 g/dL — ABNORMAL LOW (ref 3.5–5.0)
Albumin: 2.3 g/dL — ABNORMAL LOW (ref 3.5–5.0)
Anion gap: 11 (ref 5–15)
Anion gap: 8 (ref 5–15)
BUN: 44 mg/dL — ABNORMAL HIGH (ref 8–23)
BUN: 34 mg/dL — ABNORMAL HIGH (ref 8–23)
CO2: 27 mmol/L (ref 22–32)
CO2: 27 mmol/L (ref 22–32)
Calcium: 8.7 mg/dL — ABNORMAL LOW (ref 8.9–10.3)
Calcium: 8.7 mg/dL — ABNORMAL LOW (ref 8.9–10.3)
Chloride: 99 mmol/L (ref 98–111)
Chloride: 101 mmol/L (ref 98–111)
Creatinine, Ser: 1.67 mg/dL — ABNORMAL HIGH (ref 0.61–1.24)
Creatinine, Ser: 1.41 mg/dL — ABNORMAL HIGH (ref 0.61–1.24)
GFR, Estimated: 44 mL/min — ABNORMAL LOW (ref >60)
GFR, Estimated: 54 mL/min — ABNORMAL LOW (ref >60)
Glucose, Bld: 110 mg/dL — ABNORMAL HIGH (ref 70–99)
Glucose, Bld: 102 mg/dL — ABNORMAL HIGH (ref 70–99)
Phosphorus: 2.3 mg/dL — ABNORMAL LOW (ref 2.5–4.6)
Phosphorus: 2.4 mg/dL — ABNORMAL LOW (ref 2.5–4.6)
Potassium: 4.1 mmol/L (ref 3.5–5.1)
Potassium: 3.9 mmol/L (ref 3.5–5.1)
Sodium: 137 mmol/L (ref 135–145)
Sodium: 136 mmol/L (ref 135–145)

## 2023-09-12 LAB — TYPE AND SCREEN
ABO/RH(D): O POS
Antibody Screen: NEGATIVE
Unit division: 0

## 2023-09-12 LAB — POCT I-STAT 7, (LYTES, BLD GAS, ICA,H+H)
Acid-Base Excess: 3 mmol/L — ABNORMAL HIGH (ref 0.0–2.0)
Bicarbonate: 27.8 mmol/L (ref 20.0–28.0)
Calcium, Ion: 1.19 mmol/L (ref 1.15–1.40)
HCT: 23 % — ABNORMAL LOW (ref 39.0–52.0)
Hemoglobin: 7.8 g/dL — ABNORMAL LOW (ref 13.0–17.0)
O2 Saturation: 98 %
Patient temperature: 98.6
Potassium: 3.9 mmol/L (ref 3.5–5.1)
Sodium: 139 mmol/L (ref 135–145)
TCO2: 29 mmol/L (ref 22–32)
pCO2 arterial: 45 mm[Hg] (ref 32–48)
pH, Arterial: 7.399 (ref 7.35–7.45)
pO2, Arterial: 112 mm[Hg] — ABNORMAL HIGH (ref 83–108)

## 2023-09-12 LAB — GLUCOSE, CAPILLARY
Glucose-Capillary: 102 mg/dL — ABNORMAL HIGH (ref 70–99)
Glucose-Capillary: 109 mg/dL — ABNORMAL HIGH (ref 70–99)
Glucose-Capillary: 110 mg/dL — ABNORMAL HIGH (ref 70–99)
Glucose-Capillary: 116 mg/dL — ABNORMAL HIGH (ref 70–99)
Glucose-Capillary: 130 mg/dL — ABNORMAL HIGH (ref 70–99)
Glucose-Capillary: 94 mg/dL (ref 70–99)

## 2023-09-12 LAB — BPAM RBC
Blood Product Expiration Date: 202502022359
ISSUE DATE / TIME: 202501111234
Unit Type and Rh: 5100

## 2023-09-12 LAB — CBC
HCT: 24.9 % — ABNORMAL LOW (ref 39.0–52.0)
Hemoglobin: 7.8 g/dL — ABNORMAL LOW (ref 13.0–17.0)
MCH: 29.7 pg (ref 26.0–34.0)
MCHC: 31.3 g/dL (ref 30.0–36.0)
MCV: 94.7 fL (ref 80.0–100.0)
Platelets: 76 10*3/uL — ABNORMAL LOW (ref 150–400)
RBC: 2.63 MIL/uL — ABNORMAL LOW (ref 4.22–5.81)
RDW: 17.1 % — ABNORMAL HIGH (ref 11.5–15.5)
WBC: 11.2 10*3/uL — ABNORMAL HIGH (ref 4.0–10.5)
nRBC: 0.7 % — ABNORMAL HIGH (ref 0.0–0.2)

## 2023-09-12 LAB — PROCALCITONIN: Procalcitonin: 7.63 ng/mL

## 2023-09-12 LAB — MAGNESIUM: Magnesium: 2.6 mg/dL — ABNORMAL HIGH (ref 1.7–2.4)

## 2023-09-12 LAB — C4 COMPLEMENT: Complement C4, Body Fluid: 16 mg/dL (ref 12–38)

## 2023-09-12 LAB — PREALBUMIN: Prealbumin: 10 mg/dL — ABNORMAL LOW (ref 18–38)

## 2023-09-12 LAB — C3 COMPLEMENT: C3 Complement: 87 mg/dL (ref 82–167)

## 2023-09-12 MED ORDER — DARBEPOETIN ALFA 40 MCG/0.4ML IJ SOSY
40.0000 ug | PREFILLED_SYRINGE | INTRAMUSCULAR | Status: DC
  Administered 2023-09-12: 40 ug via SUBCUTANEOUS
  Filled 2023-09-12 (×2): qty 0.4

## 2023-09-12 MED ORDER — SODIUM PHOSPHATES 45 MMOLE/15ML IV SOLN
15.0000 mmol | Freq: Once | INTRAVENOUS | Status: AC
  Administered 2023-09-12: 15 mmol via INTRAVENOUS
  Filled 2023-09-12: qty 5

## 2023-09-12 MED ORDER — EMPTY CONTAINERS FLEXIBLE MISC
25.0000 ug/h | Status: DC
  Administered 2023-09-12 – 2023-09-13 (×3): 200 ug/h via INTRAVENOUS
  Filled 2023-09-12 (×3): qty 100

## 2023-09-12 NOTE — Procedures (Signed)
 Bedside Bronchoscopy Procedure Note Joshua Eaton [id number] [birth year]  Procedure: Bronchoscopy Indications: Obtain specimens for culture and/or other diagnostic studies  Procedure Details: ET Tube Size: ET Tube secured at lip (cm): Bite block in place: No In preparation for procedure, Patient hyper-oxygenated with 100 % FiO2 Airway entered and the following bronchi were examined: RUL, RML, RLL, LUL, and LLL.   Bronchoscope removed.    Evaluation BP (!) 119/54   Pulse 72   Temp 98.8 F (37.1 C) (Axillary)   Resp (!) 30   Ht 6' 0.99 (1.854 m)   Wt 55 kg   SpO2 100%   BMI 16.00 kg/m  Breath Sounds:Diminished O2 sats: stable throughout Patient's Current Condition: stable Specimens:  Sent serosanguinous fluid Complications: No apparent complications Patient did tolerate procedure well.   [name] Pouch 09/12/2023, 12:03 PM

## 2023-09-12 NOTE — Progress Notes (Signed)
 eLink Physician-Brief Progress Note Patient Name: Joshua Eaton DOB: 1956/01/29 MRN: [id number]   Date of Service  09/12/2023  HPI/Events of Note    eICU Interventions     Concentrated Fent drip ordered     Massie Maroon 09/12/2023, 12:45 AM

## 2023-09-12 NOTE — Progress Notes (Signed)
 NAME:  Joshua Eaton, MRN:  [id number], DOB:  Oct 09, 1955, LOS: 29 ADMISSION DATE:  08/13/2023, CONSULTATION DATE:  12/13 REFERRING MD:  Dr. Yolande, CHIEF COMPLAINT:  GIB   History of Present Illness:  Patient is a 68 yo M w/ pertinent PMH chronic respiratory failure w/ tracheostomy/[facility], Afib on eliquis  presents to [facility] ED on 12/13 w/ GIB.  Patient recently admitted w/ respiratory failure and pna in October 2024 requiring tracheostomy. He was later transferred to [facility] on 10/28. Later that day had an accidental trach dislodgement. His wob continued to increase requiring re-admission to [facility] on 10/31 and was intubated and later trached during hospital admission. Patient discharged on 11/12 [facility].   On 12/13 patient admitted to [facility] w/ drop in hgb and concern for GIB. GI consulted for possible endoscopy. TRH to admit and PCCM to consult for vent management.  Pertinent  Medical History   Past Medical History:  Diagnosis Date   Anxiety    Cancer (HCC)    Depression    Hypertension    OCD (obsessive compulsive disorder)    Significant Hospital Events: Including procedures, antibiotic start and stop dates in addition to other pertinent events   12/13 admitted w/ GIB. Pccm to consult for vent management 12/14 1 unit pRBC 12/15 EGD - erosion around PEG site but no active bleeding; cratered ulcer biopsied near peg site.  12/16 plugging with clots- posterior wall bleeding from distal end of trach. Hemoptysis secondary to tracheostomy malpositioning.  This is resolved. Patient had originally been on IV heparin  for DVT.  Had tracheostomy in place, and developed fairly significant hemoptysis back on 12/16.  Bronchoscopy identified the distal tip of the ostomy abutting the posterior wall of the trachea with small amount of ulceration.  He received TXA, heavy sedation, anticoagulation was held. 12/23 family fired pccm 12/24 pccm asked to re-engage  by ENT via RN staff .  12/29 # 7 adjustable length cuffed biovona placed set at 110 mm wife updated  12/30 Bedside bronchoscopy with BAL of RML 12/31 Worsening status with difficulty to ventilate, sedated, paralyzed, trach repositioned.  Started meropenem  for Serratia HAP 1/1 follow up for  biovona adjustable trach, still could not achieve optimal positioning with distal tip at posterior wall.  Ultimately required #6 proximal XLT Brovona changed trach to 6 prox xlt. Excellent mid-line positioning. Improved gas exchange.  1/2 was doing well. NMB discontinued. CXR was looking better. We were escalating diuretics. Had sudden desaturation, Nursing reports large plugs removed but remained hypoxic. RT was NOT able to pass suction cath when they arrived to assist. Also not able to provide manual BMV.  This progressed to bradycardic and then PEA cardiac arrest. Had 1 round ~ 3 minutes CPR. During this time Trach removed (as could not pass) intubated w/ # 7.5 ETT from above.  1/3  Weaning pressors. No events overnight  1/4 Dr. Geronimo spoke to Dr Maximino of CCM at [facility]- says no beds in [place] or [place] and declined transfer + lateral transfer -> they also called back later and said they are removing [name] from their list and for us  to call back in  >1 weeks to see if bed available but they are not considering [name] for acceptance  1/5 No acute issues overnight, remains on fent and prop but off pressors.  1/7 remains intubated/sedated; drop in hgb requiring 2u PRBCs; [facility] denied transfer for tracheopexy procedure; family meeting discussed and  plan on trach tomorrow 1/8 yesterday trached requiring 8 bovana; 8 shiley and 8 proximal xlt placed but significant cuff leak 1/10 CRRT  Interim History / Subjective:  Improved vent mechanics with fluid removal. CVP improved. Pressor needs stable.  Objective   Blood pressure (!) 96/43, pulse 72, temperature 98.8 F (37.1 C), temperature source  Axillary, resp. rate (!) 30, height 6' 0.99 (1.854 m), weight 55 kg, SpO2 100%. CVP:  [8 mmHg-13 mmHg] 8 mmHg  Vent Mode: PRVC FiO2 (%):  [70 %-80 %] 70 % Set Rate:  [30 bmp] 30 bmp Vt Set:  [640 mL] 640 mL PEEP:  [5 cmH20] 5 cmH20 Plateau Pressure:  [41 cmH20-44 cmH20] 41 cmH20   Intake/Output Summary (Last 24 hours) at 09/12/2023 1119 Last data filed at 09/12/2023 1100 Gross per 24 hour  Intake 3463.84 ml  Output 6065.9 ml  Net -2602.06 ml   Filed Weights   09/08/23 0500 09/11/23 0500 09/12/23 0314  Weight: 108.7 kg 60 kg 55 kg    Examination: Sedated Cuff leak not present currently RASS -5 Diffuse anasarca mildly improved Pressure ulcers stable  H/H a little higher Plts improved CXR continues to look bad: his trach looks high and balloon looks overinflated  Resolved Hospital Problem list   Hypokalemia HTN AKI Hemoptysis (resolved) secondary to sxn trauma  Hyperbilirubinemia Petechial rash resolved Rash on knee-- treated for  ringworm w/ antifungal X 2 weeks Post cardiac arrest shock. 1/2  -Cardiac arrest precipitated by airway obstruction, ? 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  H/H drop worsens with challenges with AC. ICU encephalopathy- may benefit from MRI at some point but suspect this is mostly related to chronic critical illness and needs for  high sedation to maintain ventilator synchrony, sats, and ventilation PICC associated DVT- seems to have disappeared on f/u scan on 09/03/23; LE duplex neg 08/14/23, 09/11/23 Paroxysmal Afib- cannot do AC yet Tracheostomy malfunction- he has developed significant granulation tissue along his anterior wall; moreover, needs fairly wide lumen to clear secretions.  Best thing we have been able to do to date to seat the distal end and entire balloon inside tracheal lumen is a 8-0 adjustable bivona.  Intermittent leak issues ongoing Stage IV sacral ulcer POA- CCS and WOC have evaluated.  No good options other than optimizing nutrition, turning, and local dressing.  Can consider Dakin's.  See discussion yesterday, grim prognosis  Changes for today Continue fluid removal, increase levo PRN up to 12mcg/min Trach repositioning and therapeutic/diagnostic bronch (Pct up, secretions up) Finish nebulized TXA course Hold abx for now (last day of cefepime ); if grows recurrent serratia consider inh tobra Continue bolus feeding Recheck ABG and adjust MV PRN Epo started by nephro, if/when H/H stable x 24h can try [facility] challenge  Best Practice (right click and Reselect all SmartList Selections daily)   Diet/type: tubefeeds DVT prophylaxis SCDs Pressure ulcer(s): present on admission  GI prophylaxis: PPI Lines: Central line Foley:  Yes, and it is still needed  Code Status:  full code Last date of multidisciplinary goals of care discussion Wife updated at bedside 1/12, sister updated by phone  Critical care time: 32 minutes independent of procedures   Rolan Sharps MD PCCM [facility] 09/12/2023, 11:19 AM  Please see [email or web address] for pager details.  From 7A-7P if no response, please call [phone]. After hours, please call ELink [phone].

## 2023-09-12 NOTE — Procedures (Signed)
 Bronchoscopy Procedure Note  Aaro Meyers  [id number]  24-Sep-1955  Date:09/12/23  Time:1:29 PM   Provider Performing:Kobi Mario C Claudene   Procedure(s):  Flexible bronchoscopy with bronchial alveolar lavage [phone]) and Subsequent Therapeutic Aspiration of Tracheobronchial Tree [phone])  Indication(s) Trach repositioning, mucus plugging of bronchi  Consent Verbal from wife at bedside who was present for procedure  Anesthesia Heavy sedation in place, additional dose of roc given   Time Out Verified patient identification, verified procedure, site/side was marked, verified correct patient position, special equipment/implants available, medications/allergies/relevant history reviewed, required imaging and test results available.   Sterile Technique Usual hand hygiene, masks, gowns, and gloves were used   Procedure Description Bronchoscope advanced through tracheostomy tube and into airway.   Trach balloon deflated and adjustable bivona changed so distal end was closer to the carina: this allows decreased air needed to inflate balloon as well as reduced airway pressures on vent waveform.  There was old blood clots from trach revision a couple days ago that were partially obstructing R mainstem, using suction and washing was able to get most of this out.  R side did not have too much purulence but LLL was copious.  Did a BAL in LLL (labeled as tracheal aspirate in system) to see what's driving that process.  Complications/Tolerance None; patient tolerated the procedure well. Chest X-ray is not needed post procedure.   EBL Minimal   Specimen(s) BAL LLL (labeled tracheal aspirate)

## 2023-09-12 NOTE — Progress Notes (Signed)
 [facility] Progress Note  Name: Joshua Eaton MRN: [id number] DOB: Jun 13, 1956   Subjective:  Seen and examined on CRRT.  Procedure supervised.  He was anuric over 1/11.  He had 5.9 liters UF over 1/11 with CRRT.  CXR today with severe bilateral airspace disease.  He is on FIO2 70%.  Levo is at 3 mcg/min.  Spoke with his wife at bedside and his sister via speakerphone.  Discussed the risks/benefits/indications for ESA and his wife consents for ESA.  They confirm he has no active cancer.   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  EGD revealed erosion around his PEG tube site with a cratered ulcer that was not actively bleeding.  He required tracheostomy exchange to address hemoptysis secondary to malpositioning with improvement following new tracheostomy placement.  Last week, he unfortunately suffered tracheostomy plugging progressing onto bradycardia/PEA arrest with ROSC following 3 minutes of CPR during which the tracheostomy was removed and the patient intubated.  Yesterday underwent replacement of tracheostomy after efforts to transfer him to a tertiary center for tracheopexy were unsuccessful.  Nephrology is consulted because of a progressive rise of creatinine over the last 3 days (prior to 1/6 his creatinine ranged 1.0-1.2) now with a creatinine of 2.9 with a BUN of 92.  Urine output appears to be in the anuric versus oliguric range without response to furosemide  2 days ago.  Arterial blood gas indicates respiratory acidosis with pCO2 of 77, pH 7.29 and serum  bicarbonate of 33.  He has had some transient episodes of hypotension and transiently was on pressors last week following cardiac arrest.  No recent iodinated intravenous contrast exposure.  Wife available at bedside supplements the above history and understands the possibility that he may require renal replacement therapy.     Intake/Output Summary (Last 24 hours) at 09/12/2023 0953 Last data filed at 09/12/2023 0914 Gross per 24 hour  Intake 3229.35 ml  Output 5438.3 ml  Net -2208.95 ml    Vitals:  Vitals:   09/12/23 0815 09/12/23 0830 09/12/23 0900 09/12/23 0930  BP: (!) 100/48 (!) 90/43 (!) 100/36 114/89  Pulse: 84 83 88 85  Resp: (!) 30 (!) 30 (!) 30 (!) 30  Temp:      TempSrc:      SpO2: 92% 98% 99% 99%  Weight:      Height:         Physical Exam:  General elderly male in bed in no acute distress HEENT normocephalic atraumatic extraocular movements intact sclera anicteric Neck supple trach in place Lungs coarse mechanical breath sounds; FIO2 70% and PEEP 10 Heart S1S2 no rub Abdomen soft nontender nondistended; PEG in place Extremities 2-3+ edema bilateral lower extremities  Neuro - sedation currently running; does not follow commands Access: left femoral nontunneled dialysis catheter     Medications reviewed   Labs:     Latest Ref Rng & Units 09/12/2023    4:11 AM 09/11/2023    4:10 PM 09/11/2023    3:08 PM  BMP  Glucose 70 - 99 mg/dL 889  873    BUN 8 - 23 mg/dL 44  54  Creatinine 0.61 - 1.24 mg/dL 8.32  8.04    Sodium 864 - 145 mmol/L 137  135  137   Potassium 3.5 - 5.1 mmol/L 4.1  4.1  4.0   Chloride 98 - 111 mmol/L 99  98    CO2 22 - 32 mmol/L 27  27    Calcium 8.9 - 10.3 mg/dL 8.7  8.8       Assessment/Plan:   1.  Acute kidney injury:  Anuric versus oliguric. Suspected to be ischemic ATN.  Renal with normal echogenicity and no hydro.  He was started on CRRT on 1/10 after nontunneled catheter with critical care   - Continue CRRT.  On 4K fluids.  UF  goal 100-125 ml/hr as tolerated.  Continue same    - Checking ANA, ANCA, complement, and anti-GBM - still pending. Note hx of hemoptysis however this may have been related to his trach  - His weights appear inaccurate most recently - spoke with RN and she is going to check weight   2.  Normocytic Anemia:  - Without overt blood loss and recent CT scan of the abdomen/pelvis negative for retroperitoneal bleed.  Concerning to see that he has thrombocytopenia as well but reassuring to see (currently) a normal bilirubin level.  LDH is elevated, nonspecific.  Haptoglobin is normal.  Platelets stable and slightly improved - Note earlier an EGD revealed erosion around his PEG tube site with a cratered ulcer that was not actively bleeding - Will give ESA - aranesp  40 mcg weekly on Sundays.  - Defer IV iron while infected   3.  Acute on chronic hypercarbic/hypoxic respiratory failure:  - Ventilator and tracheostomy dependent for the past 2 months.  Current picture complicated by Serratia healthcare associated pneumonia/ARDS.   - s/p cefepime .  - abx and vent per critical care   4.  Paroxysmal atrial fibrillation:  - per primary team   5.  Anasarca: failed diuretic; optimize volume as above with CRRT   6. Hypophosphatemia  - Hx hyperphosphatemia improved with CRRT and now actually with hypophosphatemia due to CRRT - replete phos  Disposition - continue ICU monitoring, starting CRRT     Katheryn [name], MD 09/12/2023 10:30 AM

## 2023-09-13 ENCOUNTER — Inpatient Hospital Stay (HOSPITAL_COMMUNITY): Payer: Medicare Other

## 2023-09-13 DIAGNOSIS — I340 Nonrheumatic mitral (valve) insufficiency: Secondary | ICD-10-CM | POA: Diagnosis not present

## 2023-09-13 DIAGNOSIS — J9601 Acute respiratory failure with hypoxia: Secondary | ICD-10-CM | POA: Diagnosis not present

## 2023-09-13 DIAGNOSIS — Z930 Tracheostomy status: Secondary | ICD-10-CM | POA: Diagnosis not present

## 2023-09-13 DIAGNOSIS — R042 Hemoptysis: Secondary | ICD-10-CM | POA: Diagnosis not present

## 2023-09-13 DIAGNOSIS — I480 Paroxysmal atrial fibrillation: Secondary | ICD-10-CM | POA: Diagnosis not present

## 2023-09-13 LAB — ANCA PROFILE
Anti-MPO Antibodies: <0.2 U (ref 0.0–0.9)
Anti-PR3 Antibodies: <0.2 U (ref 0.0–0.9)
Atypical P-ANCA titer: <1:20 {titer}
C-ANCA: <1:20 {titer}
P-ANCA: <1:20 {titer}

## 2023-09-13 LAB — RENAL FUNCTION PANEL
Albumin: 2.2 g/dL — ABNORMAL LOW (ref 3.5–5.0)
Albumin: 2.1 g/dL — ABNORMAL LOW (ref 3.5–5.0)
Anion gap: 10 (ref 5–15)
Anion gap: 10 (ref 5–15)
BUN: 29 mg/dL — ABNORMAL HIGH (ref 8–23)
BUN: 24 mg/dL — ABNORMAL HIGH (ref 8–23)
CO2: 24 mmol/L (ref 22–32)
CO2: 24 mmol/L (ref 22–32)
Calcium: 8.5 mg/dL — ABNORMAL LOW (ref 8.9–10.3)
Calcium: 8.2 mg/dL — ABNORMAL LOW (ref 8.9–10.3)
Chloride: 101 mmol/L (ref 98–111)
Chloride: 101 mmol/L (ref 98–111)
Creatinine, Ser: 1.29 mg/dL — ABNORMAL HIGH (ref 0.61–1.24)
Creatinine, Ser: 1.32 mg/dL — ABNORMAL HIGH (ref 0.61–1.24)
GFR, Estimated: >60 mL/min (ref >60)
GFR, Estimated: 58 mL/min — ABNORMAL LOW (ref >60)
Glucose, Bld: 117 mg/dL — ABNORMAL HIGH (ref 70–99)
Glucose, Bld: 133 mg/dL — ABNORMAL HIGH (ref 70–99)
Phosphorus: 2.4 mg/dL — ABNORMAL LOW (ref 2.5–4.6)
Phosphorus: 2.7 mg/dL (ref 2.5–4.6)
Potassium: 4.1 mmol/L (ref 3.5–5.1)
Potassium: 4.3 mmol/L (ref 3.5–5.1)
Sodium: 135 mmol/L (ref 135–145)
Sodium: 135 mmol/L (ref 135–145)

## 2023-09-13 LAB — CBC
HCT: 26 % — ABNORMAL LOW (ref 39.0–52.0)
Hemoglobin: 8.2 g/dL — ABNORMAL LOW (ref 13.0–17.0)
MCH: 29.5 pg (ref 26.0–34.0)
MCHC: 31.5 g/dL (ref 30.0–36.0)
MCV: 93.5 fL (ref 80.0–100.0)
Platelets: 83 10*3/uL — ABNORMAL LOW (ref 150–400)
RBC: 2.78 MIL/uL — ABNORMAL LOW (ref 4.22–5.81)
RDW: 16.9 % — ABNORMAL HIGH (ref 11.5–15.5)
WBC: 15.8 10*3/uL — ABNORMAL HIGH (ref 4.0–10.5)
nRBC: 0.6 % — ABNORMAL HIGH (ref 0.0–0.2)

## 2023-09-13 LAB — GLUCOSE, CAPILLARY
Glucose-Capillary: 120 mg/dL — ABNORMAL HIGH (ref 70–99)
Glucose-Capillary: 113 mg/dL — ABNORMAL HIGH (ref 70–99)
Glucose-Capillary: 122 mg/dL — ABNORMAL HIGH (ref 70–99)
Glucose-Capillary: 124 mg/dL — ABNORMAL HIGH (ref 70–99)
Glucose-Capillary: 108 mg/dL — ABNORMAL HIGH (ref 70–99)
Glucose-Capillary: 110 mg/dL — ABNORMAL HIGH (ref 70–99)

## 2023-09-13 LAB — MAGNESIUM: Magnesium: 2.5 mg/dL — ABNORMAL HIGH (ref 1.7–2.4)

## 2023-09-13 LAB — TRIGLYCERIDES: Triglycerides: 153 mg/dL — ABNORMAL HIGH (ref <150)

## 2023-09-13 LAB — GLOMERULAR BASEMENT MEMBRANE ANTIBODIES: GBM Ab: <0.2 U (ref 0.0–0.9)

## 2023-09-13 LAB — MRSA NEXT GEN BY PCR, NASAL: MRSA by PCR Next Gen: DETECTED — AB

## 2023-09-13 MED ORDER — SODIUM CHLORIDE 0.9 % IV SOLN
1.0000 g | Freq: Three times a day (TID) | INTRAVENOUS | Status: DC
  Administered 2023-09-13 – 2023-09-14 (×4): 1 g via INTRAVENOUS
  Filled 2023-09-13 (×4): qty 20

## 2023-09-13 MED ORDER — VANCOMYCIN HCL 750 MG/150ML IV SOLN
750.0000 mg | INTRAVENOUS | Status: DC
  Filled 2023-09-13 (×2): qty 150

## 2023-09-13 MED ORDER — VANCOMYCIN HCL IN DEXTROSE 1-5 GM/200ML-% IV SOLN
1000.0000 mg | Freq: Once | INTRAVENOUS | Status: AC
  Administered 2023-09-13: 1000 mg via INTRAVENOUS
  Filled 2023-09-13: qty 200

## 2023-09-13 MED ORDER — SODIUM PHOSPHATES 45 MMOLE/15ML IV SOLN
15.0000 mmol | Freq: Once | INTRAVENOUS | Status: AC
  Administered 2023-09-13: 15 mmol via INTRAVENOUS
  Filled 2023-09-13: qty 5

## 2023-09-13 NOTE — Progress Notes (Signed)
 Pt  with an episode of high peak pressures right after he received the scheduled albuterol  neb treatment. Peaks went from 35 to 42. Pt was lavage suctioned and got several plugs/clots out with a brief desat to 85% with recovery. Pt is now on 60% fio2 and peak pressures are now 32-36.  Dr. Gretta made aware. RT will continue to monitor and be available as needed.

## 2023-09-13 NOTE — Progress Notes (Signed)
 Nutrition Follow-up  DOCUMENTATION CODES:   Non-severe (moderate) malnutrition in context of chronic illness  INTERVENTION:  TF via PEG: 6 cartons Jevity 1.5 daily 60ml Prosource TF20 QID Provides 2450 kcal, 171g protein and 1086ml free water  daily  Increase Banatrol from BID to TID-provides 45kcal, 5g soluble fiber and 2g protein per serving.   Reduce Vitamin C  to 250mg  daily given levels are WDL  Discussed with MD assessing necessity of pancreatic enzymes versus imodium   NUTRITION DIAGNOSIS:   Moderate Malnutrition related to chronic illness as evidenced by severe muscle depletion, moderate muscle depletion, mild fat depletion. - remains applicable  GOAL:   Patient will meet greater than or equal to 90% of their needs - goal met via TF  MONITOR:   Vent status, TF tolerance, Skin  REASON FOR ASSESSMENT:   Consult Enteral/tube feeding initiation and management  ASSESSMENT:   68 yo male admitted with possible GI Bleed from [facility]; pt with hx of chronic respiratory failure requiring chronic trach/vent. Noted pt with prolonged hospital stay on October for pneumonia, severe MR, pulmonary edema requiring trach/PEG placement. PMH includes HTN, depression/amxiety, OCD  12/15 - EGD showed ulcer around PEG site but no active bleeding  12/16 bronchoscopy- plugging with clots- posterior wall bleeding from distal end of trach 12/30 - mucous plugging; s/p bronchoscopy  01/02 - sudden desaturation; large mucus plug removed; PEA arrest; trach removed; intubated via ETT 01/08 - plans for trach replacement 01/10 - CRRT initiated 01/12 - s/p bronch for secretion cleanout   Patient remains intubated on ventilator support via trach. MV: 17.2 L/min Temp (24hrs), Avg:100 F (37.8 C), Min:97.7 F (36.5 C), Max:103.7 F (39.8 C)  Continues on CRRT. Nephrology planning to stop in 1-2 days and try iHD.  Given ongoing normocytic anemia, nephrology started pt on ESA treatment  weekly. Defer IV iron for now.   Noted plans for MRI brain tomorrow.   Continues to tolerate current bolus tube feeding regimen via PEG tube.  Continues with loose stools. Discussed with CCM MD. Checking fecal elastase to determine if creon would be beneficial. Added imodium  BID x2 days to see if this helps with frequency of output. Banatrol increased to TID as well.   Drains/lines: L femoral temp HD cath  PEG tube  Admit weight: 86.6 kg Current weight: 47 kg (likely inaccurate)  Documented weights reflect 48.7 kg loss between 1/8 and 1/11. Pt placed in new bed on 1/8. Given wide change in weights, it is likely bed was zeroed out incorrectly. However documented weights have continued trending down since being on CRRT which would be expected with removal of fluid.   Medications: Vitamin C  250mg  daily, SSI 0-15 units q4h, protonix  Drips: Levo @ 5 mcg/min Propofol  @ 17.79ml/hr  Labs: Mg 2.6 Albumin  2.2 CBG's 108-125 x24 hours  Vitamin/micronutrient labs results: Vitamin A  pending Vitamin C  1.9 (wdl)  I/O's: +5.7L since 12/31 UOP: 30ml x24 hours CRRT UF: x24 hours  Diet Order:   Diet Order             Diet NPO time specified  Diet effective now                   EDUCATION NEEDS:   Not appropriate for education at this time  Skin:  Skin Integrity Issues:: Unstageable Stage III: anus Unstageable: sacrum Other: round rash on L knee; Petechial rash on hands and feet--improved  Last BM:  x 16 hours via FMS  Height:  Ht Readings from Last 1 Encounters:  08/18/23 6' 0.99 (1.854 m)    Weight:   Wt Readings from Last 1 Encounters:  09/14/23 47 kg   BMI:  Body mass index is 13.67 kg/m.  Estimated Nutritional Needs:   Kcal:  2200-2400 kcals  Protein:  165-180g  Fluid:  1L + UOP  Royce Maris, RDN, LDN Clinical Nutrition

## 2023-09-13 NOTE — Progress Notes (Signed)
 NAME:  Jandiel Magallanes, MRN:  [id number], DOB:  August 21, 1956, LOS: 30 ADMISSION DATE:  08/13/2023, CONSULTATION DATE:  12/13 REFERRING MD:  Dr. Yolande, CHIEF COMPLAINT:  GIB   History of Present Illness:  Patient is a 68 yo M w/ pertinent PMH chronic respiratory failure w/ tracheostomy/[facility], Afib on eliquis  presents to [facility] ED on 12/13 w/ GIB.  Patient recently admitted w/ respiratory failure and pna in October 2024 requiring tracheostomy. [facility] was later [facility]. Later that day had an accidental trach dislodgement. His wob continued to increase requiring re-admission to [facility] on 10/31 and was intubated and later trached during hospital admission. [facility].   On 12/13 patient admitted to [facility] w/ drop in hgb and concern for GIB. GI consulted for possible endoscopy. TRH to admit and PCCM to consult for vent management.  Pertinent  Medical History   Past Medical History:  Diagnosis Date   Anxiety    Cancer (HCC)    Depression    Hypertension    OCD (obsessive compulsive disorder)    Significant Hospital Events: Including procedures, antibiotic start and stop dates in addition to other pertinent events   12/13 admitted w/ GIB. Pccm to consult for vent management 12/14 1 unit pRBC 12/15 EGD - erosion around PEG site but no active bleeding; cratered ulcer biopsied near peg site.  12/16 plugging with clots- posterior wall bleeding from distal end of trach. Hemoptysis secondary to tracheostomy malpositioning.  This is resolved. Patient had originally been on IV heparin  for DVT.  Had tracheostomy in place, and developed fairly significant hemoptysis back on 12/16.  Bronchoscopy identified the distal tip of the ostomy abutting the posterior wall of the trachea with small amount of ulceration.  He received TXA, heavy sedation, anticoagulation was held. 12/23 family fired pccm 12/24 pccm asked to re-engage  by ENT via RN staff .  12/29 # 7 adjustable length cuffed biovona placed set at 110 mm wife updated  12/30 Bedside bronchoscopy with BAL of RML 12/31 Worsening status with difficulty to ventilate, sedated, paralyzed, trach repositioned.  Started meropenem  for Serratia HAP 1/1 follow up for  biovona adjustable trach, still could not achieve optimal positioning with distal tip at posterior wall.  Ultimately required #6 proximal XLT Brovona changed trach to 6 prox xlt. Excellent mid-line positioning. Improved gas exchange.  1/2 was doing well. NMB discontinued. CXR was looking better. We were escalating diuretics. Had sudden desaturation, Nursing reports large plugs removed but remained hypoxic. RT was NOT able to pass suction cath when they arrived to assist. Also not able to provide manual BMV.  This progressed to bradycardic and then PEA cardiac arrest. Had 1 round ~ 3 minutes CPR. During this time Trach removed (as could not pass) intubated w/ # 7.5 ETT from above.  1/3  Weaning pressors. No events overnight  1/4 Dr. Geronimo spoke to Dr Maximino of [facility]CM at [facility]- says no beds in [place] or [place] and declined transfer + lateral transfer -> they also called back later and said they are removing [name] from their list and for us  to call back in  >1 weeks to see if bed available but they are not considering [name] for acceptance  1/5 No acute issues overnight, remains on fent and prop but off pressors.  1/7 remains intubated/sedated; drop in hgb requiring 2u PRBCs; [facility] denied transfer for tracheopexy procedure; family meeting discussed and  plan on trach tomorrow 1/8 yesterday trached requiring 8 bovana; 8 shiley and 8 proximal xlt placed but significant cuff leak 1/10 CRRT 1/12 bronch for secretion cleanout, bivona adjusted, BAL LLL  Interim History / Subjective:  Overnight required BVM and saline lavages due to high vent pressures.  Objective   Blood pressure (!) 117/45, pulse  77, temperature 98.7 F (37.1 C), temperature source Oral, resp. rate (!) 30, height 6' 0.99 (1.854 m), weight 54 kg, SpO2 99%. CVP:  [6 mmHg-9 mmHg] 6 mmHg  Vent Mode: PRVC FiO2 (%):  [50 %-80 %] 60 % Set Rate:  [30 bmp] 30 bmp Vt Set:  [640 mL-6401 mL] 640 mL PEEP:  [5 cmH20] 5 cmH20 Plateau Pressure:  [30 cmH20-41 cmH20] 39 cmH20   Intake/Output Summary (Last 24 hours) at 09/13/2023 0714 Last data filed at 09/13/2023 0700 Gross per 24 hour  Intake 3108.23 ml  Output 7400 ml  Net -4291.77 ml   Filed Weights   09/11/23 0500 09/12/23 0314 09/13/23 0437  Weight: 60 kg 55 kg 54 kg    Examination: Critically ill-appearing man lying in bed on mechanical ventilation, sedated [place]/AT, eyes anicteric Trach in place, no bleeding around stoma. Breathing comfortably on mechanical ventilation.  Peak and plateau pressures very high with evidence of obstruction and air trapping.  Unable to advance [place].  CTAB. S1-S2, irregular rhythm, regular rate Abdomen soft, nontender.  PEG in place without bleeding. Improving lower extremity edema, Unna boots in place. Foley with minimal amber urine output RASS -5   Resp culture: few PMN Fungus culture: none Aspergillus BAL 0.03 BUN 29, Cr 1.29 on CRRT WBC 15.8 H/H 8.2/26 Platelets 83 CXR personally reviewed> trach & RIJ CVC in place, bilateral opacities. R costophrenic angle silhouetted.   Resolved Hospital Problem list   Hypokalemia HTN AKI Hemoptysis (resolved) secondary to sxn trauma  Hyperbilirubinemia Petechial rash resolved Rash on knee-- treated for  ringworm w/ antifungal X 2 weeks Post cardiac arrest shock. 1/2  -Cardiac arrest precipitated by airway obstruction, ? mucous plugging, with residual hypotension 2/2 sedating meds Serratia HCAP  Assessment & Plan:  Persistent ARDS and multiorgan failure- continues to have high minute ventilation needs due to high dead space, combination fluid, intrinsic lung injury, and possibly  VTE Baseline COPD Tracheostomy malfunction- he has developed significant granulation tissue along his anterior wall; moreover, needs fairly wide lumen to clear secretions.  Best thing we have been able to do to date to seat the distal end and entire balloon inside tracheal lumen is a 8-0 adjustable bivona.  Intermittent leak issues ongoing Recurrent clotting of tracheostomy tube-- had posterior wall protrusion into the trachea that seems to not fit well with any trachs we have tried.  -I discussed with his wife the changes advancing the bivona. I am not sure that his anatomy is going to work well with any trachs; I suspect his trachea is more anterior directed distally and the straight shaft of the tracheostomy tube has been repetitively been causing trauma to the posterior wall.  -need to watch TXA nebs-- question if these are causing more clotting of trach, although we have limited other options to control the tracheal mucosa bleeding.  -Pplat back to 29 post- bronch; con't LTVV with goal Pplat<30. -VAP prevention protocol -PAD protocol -daily SAT & SBT  -NMB PRN for significant dyssychrony -con't bronchodilators -Broadening antibiotics with worsening leukocytosis and purulent drainage from Sunday's bronch. Follow bronch cultures.   Baseline severe valvular heart disease- leaflet perforation and  severe MR, moderate AI -unfortunately not a surgical candidate due to severe comorbidities -poor prognosis with the progressive nature of valve disease  Renal failure with volume overload Significant third spacing/ anasarca -con't CRRT  -renally dose meds -d/c foley and monitor with bladder scans today  Recurrent ABLA- related to bone marrow failure, malnutrition, phlebotomy, tracheal suctioning, and ongoing slow losses through pressure injuries and probable slow GIB (duodenitis and antral ulcer on last EGD 08/15/23).  H/H drop worsens with challenges with AC. -unfortunately still need to hold [facility]  with ongoing tracheal bleeding -aranesp  per nephrology  ICU encephalopathy- may benefit from MRI at some point but suspect this is mostly related to chronic critical illness and needs for high sedation to maintain ventilator synchrony, sats, and ventilation -PAD protocol; with tracheostomy issues, would con't heavier sedation for now -CRRT for metabolic clearance -family has diligently been at bedside, which is helpful -mobility as able  PICC associated DVT- seems to have disappeared on f/u scan on 09/03/23; LE duplex neg 08/14/23, 09/11/23 -holding AC for now; high risk for recurrent clot  Paroxysmal Afib- cannot do AC yet -discussed clot risk with wife but currently too high risk for AC -rate control with enteral amiodarone  -tele monitoring  Stage IV sacral ulcer POA- CCS and WOC have evaluated.   -turns, optimize nutrition  Hyperglycemia due to critical illness -SSI PRN -goal BG 140-180  Severe protein energy malnutrition diarrhea -TF &  protein supplements -banatrol  Agree with previous assessments of grim prognosis.  Changes for today Volume removal with CRRT Escalating antibiotics- meropenem , vanc; follow trach cultures from 1/12 Bronch performed today with wife at bedside; advanced bivona distally.  Monitor vent closely, worry he may reocclude trach. Airway exchange catheter left at bedside in case we are unable to ventilate.   Wife updated at bedside and agrees with plan.  Best Practice (right click and Reselect all SmartList Selections daily)   Diet/type: tubefeeds DVT prophylaxis SCDs Pressure ulcer(s): present on admission  GI prophylaxis: PPI Lines: Central line, HD catheter Foley:  d/c 1/13 Code Status:  full code Last date of multidisciplinary goals of care discussion Wife updated at bedside 1/13 at bedside  Critical care time: 59 min. independent of procedures  Leita SHAUNNA Gaskins, DO 09/13/23 8:18 AM [place] [facility]  For contact  information, see Amion. If no response to pager, please call PCCM consult pager. After hours, 7PM- 7AM, please call Elink.

## 2023-09-13 NOTE — Procedures (Signed)
 Bronchoscopy Procedure Note  Eldo Umanzor  [id number]  [birth year]  Date:09/13/23  Time:7:50 AM   Provider Performing:Teagyn Fishel P Gretta   Procedure(s):  Flexible Bronchoscopy [phone])  Indication(s) Tracheostomy occlusion  Consent Risks of the procedure as well as the alternatives and risks of each were explained to the patient and/or caregiver.  Consent for the procedure was obtained and is signed in the bedside chart  Wife at bedside throughout procedure.  Anesthesia Per MAR   Time Out Verified patient identification, verified procedure, site/side was marked, verified correct patient position, special equipment/implants available, medications/allergies/relevant history reviewed, required imaging and test results available.   Sterile Technique Usual hand hygiene, masks, gowns, and gloves were used   Procedure Description Bronchoscope advanced through tracheostomy tube and into airway. Clot occluding >90% of distal tracheostomy tube. Clot was pushed through with saline and the scope with complete opening of the distal tracheostomy tube. Some oozing. Posterior wall of trachea pushing up against distal tracheostomy tube back edge and occluding ~ 20% of the diameter-- not dynamic occlusion. Advancing bivona distally did not change this.  Following diagnostic evaluation, clot evacuated from R mainstem. No significant purulent secretions.   Bivona re-locked at that distance.  Findings: see above   Complications/Tolerance None; patient tolerated the procedure well. Chest X-ray is not needed post procedure.   EBL Minimal   Specimen(s) None  Leita SHAUNNA Gretta, DO 09/13/23 7:55 AM [place] [facility]  For contact information, see Amion. If no response to pager, please call PCCM consult pager. After hours, 7PM- 7AM, please call Elink.

## 2023-09-13 NOTE — TOC Progression Note (Signed)
 Transition of Care [facility]) - Progression Note    Patient Details  Name: Joshua Eaton MRN: [id number] Date of Birth: [birth year]  [facility]) CM/SW Contact  Tom-Johnson, Harvest Muskrat, RN Phone Number: 09/13/2023, 2:28 PM  Clinical Narrative:     Patient continues on Trach-Vent. Underwent Bronchoscopy for Trach occlusion/secretion clean out and Bivona adjusted today 09/13/23. On IV abx, CRRT.     Patient not Medically ready for discharge.  CM will continue to follow as patient progresses with care towards discharge.          Expected Discharge Plan and Services                                               Social Determinants of Health (SDOH) Interventions SDOH Screenings   Food Insecurity: No Food Insecurity (08/21/2023)  Housing: Unknown (08/21/2023)  Transportation Needs: No Transportation Needs (08/21/2023)  Utilities: Not At Risk (08/21/2023)  Financial Resource Strain: Patient Unable To Answer (07/14/2023)   Received from [facility]  Social Connections: Patient Unable To Answer (09/08/2023)  Stress: Patient Unable To Answer (07/14/2023)   Received from [facility]  Tobacco Use: Low Risk  (08/31/2023)   Received from [facility]    Readmission Risk Interventions    08/16/2023    3:55 PM  Readmission Risk Prevention Plan  Transportation Screening Complete  Medication Review (RN Care Manager) Referral to Pharmacy  PCP or Specialist appointment within 3-5 days of discharge Complete  HRI or Home Care Consult Complete  SW Recovery Care/Counseling Consult Complete  Skilled Nursing Facility Complete

## 2023-09-13 NOTE — Progress Notes (Addendum)
 Discussed case with ENT regarding ongoing posterior wall irritation from the trach. He recommended:  Gauze under trach flange inferiorly to rock tracheostomy tube anteriorly Head / neck extension to Joshua Eaton the same. Try saline bullets regularly with suctioning, not too deep Txa nebs-- discussed that this may have precipitated some of the clotting issues, he agreed hold off.    Joshua SHAUNNA Gaskins, Joshua Eaton 09/13/23 10:39 AM [place] [facility]  For contact information, see Amion. If no response to pager, please call PCCM consult pager. After hours, 7PM- 7AM, please call Elink.   Discussed recommendations for positioning of trach and his head/ neck with RT & RN.  Joshua SHAUNNA Gaskins, Joshua Eaton 09/13/23 11:38 AM [place] [facility]

## 2023-09-13 NOTE — Progress Notes (Signed)
 RT assisted Dr. Gretta with bedside bronchoscopy.  Bivona trach advanced as far as it will go per Dr. Gretta with visualization with bronchoscope. Good return volumes on ventilator. Procedure performed without complication. RT will continue to monitor and be available as needed.

## 2023-09-13 NOTE — Progress Notes (Signed)
 [facility] NEPHROLOGY PROGRESS NOTE  Assessment/ Plan: Pt is a 68 y.o. 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  #Acute kidney injury: Anuric, suspected to be ischemic ATN.  Renal with normal echogenicity and no hydro.  He was started on CRRT on 1/10 after nontunneled catheter with critical care   - Continue CRRT.  On 4K fluids.  UF goal 100-125 ml/hr as tolerated.  Continue same    -C3, C4 unremarkable however pending ANA, anti-GBM and ANCA. Note hx of hemoptysis however this may have been related to his trach. -Daily lab monitoring and strict ins and out.   # Normocytic Anemia: Continue ESA and monitor hemoglobin.  Transfuse as needed.  # Acute on chronic hypercarbic/hypoxic respiratory failure:  - Ventilator and tracheostomy dependent for the past 2 months.  Current picture complicated by [facility] associated pneumonia/ARDS.   - s/p cefepime .  - abx and vent per critical care    # Paroxysmal atrial fibrillation:  - per primary team    #  Anasarca: failed diuretic; optimize volume as above with CRRT    # Hypophosphatemia due to CRRT and replete as needed.  Subjective: Seen and examined ICU.  CRRT running well.  Currently on 2 mics of levo.  No new event.  His wife was presented with him at the bedside.  Discussed with ICU nurse.  Objective Vital signs in last 24 hours: Vitals:   09/13/23 0830 09/13/23 0900 09/13/23 0930 09/13/23 1000  BP: (!) 103/42 (!) 110/46 (!) 118/46 (!) 110/46  Pulse: 84 79 79 79  Resp: (!) 30 (!) 30 (!) 30 (!) 30  Temp:      TempSrc:      SpO2: 91% 97% 96% 99%  Weight:      Height:       Weight change: -1 kg  Intake/Output Summary (Last 24 hours) at 09/13/2023 1028 Last data filed at 09/13/2023 0700 Gross per  24 hour  Intake 2781.56 ml  Output 6699 ml  Net -3917.44 ml       Labs: RENAL PANEL Recent Labs  Lab 09/11/23 0405 09/11/23 1056 09/11/23 1610 09/12/23 0411 09/12/23 1210 09/12/23 1800 09/13/23 0326 09/13/23 0327  NA 141   < > 135 137 139 136 135  --   K 4.6   < > 4.1 4.1 3.9 3.9 4.1  --   CL 99  --  98 99  --  101 101  --   CO2 29  --  27 27  --  27 24  --   GLUCOSE 128*  --  126* 110*  --  102* 117*  --   BUN 74*  --  54* 44*  --  34* 29*  --   CREATININE 2.62*  --  1.95* 1.67*  --  1.41* 1.29*  --   CALCIUM 8.9  --  8.8* 8.7*  --  8.7* 8.5*  --   MG 2.8*  --   --  2.6*  --   --   --  2.5*  PHOS 5.1*  --  3.3 2.3*  --  2.4* 2.4*  --   ALBUMIN  2.7*  --  2.7* 2.6*  --  2.3* 2.2*  --    < > = values in this interval not displayed.    Liver Function Tests:  Recent Labs  Lab 09/12/23 0411 09/12/23 1800 09/13/23 0326  ALBUMIN  2.6* 2.3* 2.2*   No results for input(s): LIPASE, AMYLASE in the last 168 hours. No results for input(s): AMMONIA in the last 168 hours. CBC: Recent Labs    06/04/23 1311 06/05/23 0209 09/08/23 1330 09/08/23 1729 09/11/23 1508 09/11/23 1610 09/12/23 0411 09/12/23 1210 09/13/23 0327  HGB  --    < >  --    < > 11.2* 7.7* 7.8* 7.8* 8.2*  MCV  --    < >  --    < >  --   --  94.7  --  93.5  VITAMINB12 455  --  802  --   --   --   --   --   --   FOLATE 5.0*  --  9.9  --   --   --   --   --   --   FERRITIN  --   --  2,200*  --   --   --   --   --   --   TIBC  --   --  168*  --   --   --   --   --   --   IRON  --   --  40*  --   --   --   --   --   --   RETICCTPCT  --   --  2.3  --   --   --   --   --   --    < > = values in this interval not displayed.    Cardiac Enzymes: No results for input(s): CKTOTAL, CKMB, CKMBINDEX, TROPONINI in the last 168 hours. CBG: Recent Labs  Lab 09/12/23 1600 09/12/23 1940 09/12/23 2343 09/13/23 0318 09/13/23 0814  GLUCAP 102* 109* 130* 120* 113*    Iron Studies: No results for  input(s): IRON, TIBC, TRANSFERRIN, FERRITIN in the last 72 hours. Studies/Results: DG Chest Port 1 View Result Date: 09/13/2023 CLINICAL DATA:  [id number].  History of ETT, airspace disease. EXAM: PORTABLE CHEST 1 VIEW COMPARISON:  Portable chest yesterday at 5:21 a.m. FINDINGS: 5:02 a.m. diffuse heterogeneous bilateral airspace disease continues to be seen, with a somewhat basal gradient and overlying small pleural effusions. The opacities are slightly less dense in the upper lung fields today but are otherwise not significantly changed. Stable cardiac size with central vascular prominence. Stable mediastinum. Right IJ central line terminates in the distal SVC, tracheostomy cannula tip in between the heads of the clavicles. All are unchanged. IMPRESSION: 1. Diffuse heterogeneous bilateral airspace disease with a somewhat basal gradient and overlying small pleural effusions. The opacities are slightly less dense in the upper lung fields today but are otherwise not significantly changed. 2. Stable cardiac size with central vascular prominence. 3. Stable support apparatus. Electronically Signed   By: Francis Quam M.D.   On: 09/13/2023 06:00   DG Chest Port 1 View Result Date: 09/12/2023 CLINICAL DATA:  Short of breath, respiratory failure EXAM: PORTABLE CHEST 1 VIEW COMPARISON:  None Available. FINDINGS: Tracheostomy 2 and central venous line unchanged. Stable cardiac silhouette. Bilateral severe patchy airspace disease. Low lung volumes. No pneumothorax. No interval change IMPRESSION: 1. No interval change. 2. Severe bilateral airspace disease. Electronically Signed   By: Jackquline Boxer M.D.   On: 09/12/2023 08:51   VAS US  LOWER EXTREMITY VENOUS (DVT) Result Date: 09/12/2023  Lower Venous DVT Study Patient Name:  Joshua Eaton  Date  of Exam:   09/11/2023 Medical Rec #: [id number]   Accession #:    [id number] Date of Birth: 1956-07-19    Patient Gender: M Patient Age:   24 years Exam Location:  [facility] Procedure:      VAS US  LOWER EXTREMITY VENOUS (DVT) Referring Phys: TORIBIO SHARPS --------------------------------------------------------------------------------  Indications: Edema.  Risk Factors: Patient on CRRT, ventilation. Limitations: Anasarca, unna boots. Comparison Study: Prior negative bilateral LEV done 06/16/23 Performing Technologist: Alberta Lis RVS  Examination Guidelines: A complete evaluation includes B-mode imaging, spectral Doppler, color Doppler, and power Doppler as needed of all accessible portions of each vessel. Bilateral testing is considered an integral part of a complete examination. Limited examinations for reoccurring indications may be performed as noted. The reflux portion of the exam is performed with the patient in reverse Trendelenburg.  +---------+---------------+---------+-----------+---------------+--------------+ RIGHT    CompressibilityPhasicitySpontaneityProperties     Thrombus Aging +---------+---------------+---------+-----------+---------------+--------------+ CFV      Full           Yes      No         pulsatile                                                                 waveform                      +---------+---------------+---------+-----------+---------------+--------------+ SFJ      Full                                                             +---------+---------------+---------+-----------+---------------+--------------+ FV Prox  Full                                                             +---------+---------------+---------+-----------+---------------+--------------+ FV Mid   Full                                                             +---------+---------------+---------+-----------+---------------+--------------+ FV DistalFull           Yes      No         pulsatile                                                                 waveform                       +---------+---------------+---------+-----------+---------------+--------------+ PFV      Full                                                             +---------+---------------+---------+-----------+---------------+--------------+  POP      Full           Yes      No         pulsatile                                                                 waveform                      +---------+---------------+---------+-----------+---------------+--------------+ PERO                                                       Not visualized                                                            secondary to                                                              unna boots     +---------+---------------+---------+-----------+---------------+--------------+ Soleal                                                     Not visualized                                                            secondary to                                                              unna boots     +---------+---------------+---------+-----------+---------------+--------------+ Gastroc  Full           Yes      No         pulsatile                                                                 waveform                      +---------+---------------+---------+-----------+---------------+--------------+   +---------+---------------+---------+-----------+---------------+--------------+  LEFT     CompressibilityPhasicitySpontaneityProperties     Thrombus Aging +---------+---------------+---------+-----------+---------------+--------------+ CFV      Full           Yes      No         pulsatile                                                                 waveform                      +---------+---------------+---------+-----------+---------------+--------------+ SFJ      Full                                                              +---------+---------------+---------+-----------+---------------+--------------+ FV Prox  Full           Yes      No         pulsatile                                                                 waveform                      +---------+---------------+---------+-----------+---------------+--------------+ FV Mid   Full                                                             +---------+---------------+---------+-----------+---------------+--------------+ FV DistalFull           Yes      No         pulsatile                                                                 waveform                      +---------+---------------+---------+-----------+---------------+--------------+ PFV      Full           Yes      No         pulsatile                                                                 waveform                      +---------+---------------+---------+-----------+---------------+--------------+  POP      Full           Yes      No         pulsatile                                                                 waveform                      +---------+---------------+---------+-----------+---------------+--------------+ PTV                                                        Not visualized                                                            secondary to                                                              unna boots     +---------+---------------+---------+-----------+---------------+--------------+ PERO                                                       Not visualized                                                            secondary to                                                              unna boots     +---------+---------------+---------+-----------+---------------+--------------+ Gastroc  Full           Yes      No         pulsatile                                                                  waveform                      +---------+---------------+---------+-----------+---------------+--------------+  Summary: RIGHT: - There is no evidence of deep vein thrombosis in the lower extremity. However, portions of this examination were limited- see technologist comments above.  - No cystic structure found in the popliteal fossa. Pulsatile waveforms suggestive of fluid overload noted throughout  LEFT: - There is no evidence of deep vein thrombosis in the lower extremity. However, portions of this examination were limited- see technologist comments above.  - No cystic structure found in the popliteal fossa. Pulsatile waveforms suggestive of fluid overload noted throughout.  *See table(s) above for measurements and observations. Electronically signed by Penne Colorado MD on 09/12/2023 at 7:26:09 AM.    Final     Medications: Infusions:   prismasol  BGK 4/2.5 400 mL/hr at 09/13/23 0619    prismasol  BGK 4/2.5 400 mL/hr at 09/13/23 0525   fentaNYL  infusion INTRAVENOUS 200 mcg/hr (09/13/23 0700)   meropenem  (MERREM ) IV     midazolam  5 mg/hr (09/13/23 0700)   norepinephrine  (LEVOPHED ) Adult infusion 2 mcg/min (09/13/23 0700)   prismasol  BGK 4/2.5 1,800 mL/hr at 09/13/23 0612   propofol  (DIPRIVAN ) infusion 30 mcg/kg/min (09/13/23 0700)   vancomycin      [START ON 09/14/2023] vancomycin       Scheduled Medications:  sodium chloride    Intravenous Once   sodium chloride    Intravenous Once   albuterol   2.5 mg Nebulization Q6H   amiodarone   200 mg Per Tube Daily   ascorbic acid   250 mg Per Tube BID   Chlorhexidine  Gluconate Cloth  6 each Topical Daily   darbepoetin (ARANESP ) injection - NON-DIALYSIS  40 mcg Subcutaneous Q Sun-1800   feeding supplement (JEVITY 1.5 CAL/FIBER)  237 mL Per Tube 6 X Daily   feeding supplement (PROSource TF20)  60 mL Per Tube QID   fiber supplement (BANATROL TF)  60 mL Per Tube BID   guaiFENesin   15 mL Per Tube QID   insulin   aspart  0-15 Units Subcutaneous Q4H   leptospermum manuka honey  1 Application Topical Daily   mouth rinse  15 mL Mouth Rinse Q2H   pantoprazole  (PROTONIX ) IV  40 mg Intravenous Q12H   QUEtiapine   25 mg Per Tube QHS   sertraline   25 mg Per Tube Daily   terbinafine    Topical BID   tranexamic acid   500 mg Nebulization Q8H    have reviewed scheduled and prn medications.  Physical Exam: General:NAD,  trach on vent Heart:RRR, s1s2 nl Lungs: Coarse breath sound. Abdomen:soft, non-distended Extremities:No edema Dialysis Access: Left femoral temporary HD catheter in place.  Teo Moede Prasad Huston Stonehocker 09/13/2023,10:28 AM  LOS: 30 days

## 2023-09-13 NOTE — Progress Notes (Signed)
 Pharmacy Antibiotic Note  Joshua Eaton is a 68 y.o. male admitted on 08/13/2023 with prolonged hospital stay, worsening ventilation settings over the past several days, concern for pneumonia.  Pharmacy has been consulted for meropenem  and vancomycin  dosing. Note patient is currently on CRRT.   Plan: Meropenem  1g Q8H.  Vancomycin  1000mg  x1 followed by 750mg  q24h.  Follow culture data for de-escalation.  Monitor renal function for dose adjustments as indicated.   Height: 6' 0.99 (185.4 cm) Weight: 54 kg (119 lb 0.8 oz) IBW/kg (Calculated) : 79.88  Temp (24hrs), Avg:98.4 F (36.9 C), Min:98 F (36.7 C), Max:98.7 F (37.1 C)  Recent Labs  Lab 09/09/23 0543 09/10/23 0450 09/10/23 1700 09/11/23 0405 09/11/23 1610 09/12/23 0411 09/12/23 1800 09/13/23 0326 09/13/23 0327  WBC 5.8 4.4  --  6.6  --  11.2*  --   --  15.8*  CREATININE 2.95*  2.88* 3.61*   < > 2.62* 1.95* 1.67* 1.41* 1.29*  --    < > = values in this interval not displayed.    Estimated Creatinine Clearance: 41.3 mL/min (A) (by C-G formula based on SCr of 1.29 mg/dL (H)).    Allergies  Allergen Reactions   Codeine     sends me into orbit    Thank you for allowing pharmacy to be a part of this patient's care.  Powell Blush, PharmD, BCCCP  09/13/2023 7:52 AM

## 2023-09-14 ENCOUNTER — Inpatient Hospital Stay (HOSPITAL_COMMUNITY): Payer: Medicare Other

## 2023-09-14 DIAGNOSIS — J9601 Acute respiratory failure with hypoxia: Secondary | ICD-10-CM | POA: Diagnosis not present

## 2023-09-14 DIAGNOSIS — N179 Acute kidney failure, unspecified: Secondary | ICD-10-CM | POA: Diagnosis not present

## 2023-09-14 DIAGNOSIS — Z930 Tracheostomy status: Secondary | ICD-10-CM | POA: Diagnosis not present

## 2023-09-14 DIAGNOSIS — Z7189 Other specified counseling: Secondary | ICD-10-CM | POA: Diagnosis not present

## 2023-09-14 LAB — CULTURE, RESPIRATORY W GRAM STAIN: Culture: NORMAL

## 2023-09-14 LAB — RENAL FUNCTION PANEL
Albumin: 2.2 g/dL — ABNORMAL LOW (ref 3.5–5.0)
Albumin: 2.2 g/dL — ABNORMAL LOW (ref 3.5–5.0)
Anion gap: 8 (ref 5–15)
Anion gap: 8 (ref 5–15)
BUN: 21 mg/dL (ref 8–23)
BUN: 23 mg/dL (ref 8–23)
CO2: 26 mmol/L (ref 22–32)
CO2: 26 mmol/L (ref 22–32)
Calcium: 8.5 mg/dL — ABNORMAL LOW (ref 8.9–10.3)
Calcium: 8.4 mg/dL — ABNORMAL LOW (ref 8.9–10.3)
Chloride: 103 mmol/L (ref 98–111)
Chloride: 102 mmol/L (ref 98–111)
Creatinine, Ser: 1.24 mg/dL (ref 0.61–1.24)
Creatinine, Ser: 1.17 mg/dL (ref 0.61–1.24)
GFR, Estimated: >60 mL/min (ref >60)
GFR, Estimated: >60 mL/min (ref >60)
Glucose, Bld: 131 mg/dL — ABNORMAL HIGH (ref 70–99)
Glucose, Bld: 138 mg/dL — ABNORMAL HIGH (ref 70–99)
Phosphorus: 2.5 mg/dL (ref 2.5–4.6)
Phosphorus: 2.8 mg/dL (ref 2.5–4.6)
Potassium: 4.5 mmol/L (ref 3.5–5.1)
Potassium: 4.6 mmol/L (ref 3.5–5.1)
Sodium: 137 mmol/L (ref 135–145)
Sodium: 136 mmol/L (ref 135–145)

## 2023-09-14 LAB — GLUCOSE, CAPILLARY
Glucose-Capillary: 125 mg/dL — ABNORMAL HIGH (ref 70–99)
Glucose-Capillary: 112 mg/dL — ABNORMAL HIGH (ref 70–99)
Glucose-Capillary: 138 mg/dL — ABNORMAL HIGH (ref 70–99)
Glucose-Capillary: 121 mg/dL — ABNORMAL HIGH (ref 70–99)
Glucose-Capillary: 123 mg/dL — ABNORMAL HIGH (ref 70–99)
Glucose-Capillary: 113 mg/dL — ABNORMAL HIGH (ref 70–99)

## 2023-09-14 LAB — CBC
HCT: 25.7 % — ABNORMAL LOW (ref 39.0–52.0)
Hemoglobin: 8.3 g/dL — ABNORMAL LOW (ref 13.0–17.0)
MCH: 30 pg (ref 26.0–34.0)
MCHC: 32.3 g/dL (ref 30.0–36.0)
MCV: 92.8 fL (ref 80.0–100.0)
Platelets: 89 10*3/uL — ABNORMAL LOW (ref 150–400)
RBC: 2.77 MIL/uL — ABNORMAL LOW (ref 4.22–5.81)
RDW: 17.2 % — ABNORMAL HIGH (ref 11.5–15.5)
WBC: 19.2 10*3/uL — ABNORMAL HIGH (ref 4.0–10.5)
nRBC: 0.5 % — ABNORMAL HIGH (ref 0.0–0.2)

## 2023-09-14 LAB — MAGNESIUM: Magnesium: 2.6 mg/dL — ABNORMAL HIGH (ref 1.7–2.4)

## 2023-09-14 LAB — ANA W/REFLEX IF POSITIVE: Anti Nuclear Antibody (ANA): NEGATIVE

## 2023-09-14 MED ORDER — BANATROL TF EN LIQD
60.0000 mL | Freq: Three times a day (TID) | ENTERAL | Status: DC
  Administered 2023-09-14 – 2023-09-15 (×4): 60 mL
  Filled 2023-09-14 (×4): qty 60

## 2023-09-14 MED ORDER — VANCOMYCIN HCL 1000 MG IV SOLR
750.0000 mg | INTRAVENOUS | Status: DC
  Administered 2023-09-14: 750 mg via INTRAVENOUS
  Filled 2023-09-14: qty 15

## 2023-09-14 MED ORDER — MUPIROCIN 2 % EX OINT
TOPICAL_OINTMENT | Freq: Two times a day (BID) | CUTANEOUS | Status: DC
  Filled 2023-09-14: qty 22

## 2023-09-14 MED ORDER — SODIUM CHLORIDE 0.9 % IV SOLN
2.0000 g | INTRAVENOUS | Status: DC
  Administered 2023-09-14: 2 g via INTRAVENOUS
  Filled 2023-09-14: qty 20

## 2023-09-14 MED ORDER — VANCOMYCIN HCL IN DEXTROSE 750-5 MG/150ML-% IV SOLN
750.0000 mg | INTRAVENOUS | Status: DC
  Filled 2023-09-14: qty 150

## 2023-09-14 MED ORDER — LOPERAMIDE HCL 1 MG/7.5ML PO SUSP
1.0000 mg | Freq: Two times a day (BID) | ORAL | Status: DC
  Administered 2023-09-14 – 2023-09-15 (×3): 1 mg
  Filled 2023-09-14 (×3): qty 7.5

## 2023-09-14 MED ORDER — VITAMIN C 500 MG PO TABS
250.0000 mg | ORAL_TABLET | Freq: Every day | ORAL | Status: DC
  Administered 2023-09-15: 250 mg
  Filled 2023-09-14: qty 1

## 2023-09-14 NOTE — Progress Notes (Signed)
 Patient ID: Joshua Eaton, male   DOB: 06-30-1956, 68 y.o.   MRN: [id number]    Progress Note from the Palliative Medicine Team at [facility]   Patient Name: Joshua Eaton        Date: 09/14/2023 DOB: 25-Jan-1956  Age: 68 y.o. MRN#: [id number] Attending Physician: Gretta Leita SQUIBB, DO Primary Care Physician: Delores Corean Pollen, MD Admit Date: 08/13/2023   Spoke briefly with patient's wife at bedside to offer therapeutic listening and emotional support.  She has no specific question or concern for me today  PMT will continue to support from the periphery,   family encouraged to call with questions or concerns.  They have PMT contact information  Patient and family fully supported by CCM  No charge   Ronal Plants NP  Palliative Medicine Team Team Phone # [phone] Pager [phone]

## 2023-09-14 NOTE — Progress Notes (Signed)
 [facility] NEPHROLOGY PROGRESS NOTE  Assessment/ Plan: Pt is a 68 y.o. 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  #Acute kidney injury: Anuric, suspected to be ischemic ATN.  Renal with normal echogenicity and no hydro.  He was started on CRRT on 1/10 after nontunneled catheter with critical care   - Continue CRRT.  On 4K fluids.  UF goal 100-125 ml/hr as tolerated.  Continue same.  Planning to stop CRRT in next 1 to 2 days and try IHD. -C3, C4, ANA, anti-GBM and ANCA negative. Note hx of hemoptysis however this may have been related to his trach. -Daily lab monitoring and strict ins and out.   # Normocytic Anemia: Continue ESA and monitor hemoglobin.  Transfuse as needed.  # Acute on chronic hypercarbic/hypoxic respiratory failure:  - Ventilator and tracheostomy dependent for the past 2 months.  Current picture complicated by [facility] associated pneumonia/ARDS.   - s/p cefepime .  - abx and vent per critical care    # Paroxysmal atrial fibrillation:  - per primary team    #  Anasarca: failed diuretic; optimize volume as above with CRRT    # Hypophosphatemia due to CRRT and replete as needed.  Discussed with the patient's wife and ICU staff's including provider.  Subjective: Seen and examined ICU.  Tolerating CRRT well.  UF around 100-125 cc an hour.  No urine output.  His wife was presented with him.  On trach and vent with FiO2 60, Levophed  around 3 mics. Objective Vital signs in last 24 hours: Vitals:   09/14/23 0730 09/14/23 0745 09/14/23 0800 09/14/23 0830  BP: (!) 139/55 (!) 133/57 (!) 137/55 (!) 127/58  Pulse: 73 73 72 76  Resp: (!) 30 (!) 30 (!) 30 (!) 30  Temp:      TempSrc:      SpO2: 98% 96% 99% 98%  Weight:      Height:        Weight change: -7 kg  Intake/Output Summary (Last 24 hours) at 09/14/2023 0946 Last data filed at 09/14/2023 0900 Gross per 24 hour  Intake 3042.79 ml  Output 4480 ml  Net -1437.21 ml       Labs: RENAL PANEL Recent Labs  Lab 09/11/23 0405 09/11/23 1056 09/12/23 0411 09/12/23 1210 09/12/23 1800 09/13/23 0326 09/13/23 0327 09/13/23 1745 09/14/23 0359  NA 141   < > 137 139 136 135  --  135 137  K 4.6   < > 4.1 3.9 3.9 4.1  --  4.3 4.5  CL 99   < > 99  --  101 101  --  101 103  CO2 29   < > 27  --  27 24  --  24 26  GLUCOSE 128*   < > 110*  --  102* 117*  --  133* 131*  BUN 74*   < > 44*  --  34* 29*  --  24* 21  CREATININE 2.62*   < > 1.67*  --  1.41* 1.29*  --  1.32* 1.24  CALCIUM 8.9   < > 8.7*  --  8.7* 8.5*  --  8.2* 8.5*  MG 2.8*  --  2.6*  --   --   --  2.5*  --  2.6*  PHOS 5.1*   < > 2.3*  --  2.4* 2.4*  --  2.7 2.5  ALBUMIN  2.7*   < > 2.6*  --  2.3* 2.2*  --  2.1* 2.2*   < > = values in this interval not displayed.    Liver Function Tests: Recent Labs  Lab 09/13/23 0326 09/13/23 1745 09/14/23 0359  ALBUMIN  2.2* 2.1* 2.2*   No results for input(s): LIPASE, AMYLASE in the last 168 hours. No results for input(s): AMMONIA in the last 168 hours. CBC: Recent Labs    06/04/23 1311 06/05/23 0209 09/08/23 1330 09/08/23 1729 09/11/23 1610 09/12/23 0411 09/12/23 1210 09/13/23 0327 09/14/23 0359  HGB  --    < >  --    < > 7.7* 7.8* 7.8* 8.2* 8.3*  MCV  --    < >  --    < >  --  94.7  --  93.5 92.8  VITAMINB12 455  --  802  --   --   --   --   --   --   FOLATE 5.0*  --  9.9  --   --   --   --   --   --   FERRITIN  --   --  2,200*  --   --   --   --   --   --   TIBC  --   --  168*  --   --   --   --   --   --   IRON  --   --  40*  --   --   --   --   --   --   RETICCTPCT  --   --  2.3  --   --   --   --   --   --    < > = values in this interval not displayed.    Cardiac Enzymes: No results for input(s): CKTOTAL, CKMB, CKMBINDEX,  TROPONINI in the last 168 hours. CBG: Recent Labs  Lab 09/13/23 1519 09/13/23 2001 09/13/23 2305 09/14/23 0332 09/14/23 0718  GLUCAP 124* 108* 110* 125* 112*    Iron Studies: No results for input(s): IRON, TIBC, TRANSFERRIN, FERRITIN in the last 72 hours. Studies/Results: DG Chest Port 1 View Result Date: 09/13/2023 CLINICAL DATA:  [id number].  History of ETT, airspace disease. EXAM: PORTABLE CHEST 1 VIEW COMPARISON:  Portable chest yesterday at 5:21 a.m. FINDINGS: 5:02 a.m. diffuse heterogeneous bilateral airspace disease continues to be seen, with a somewhat basal gradient and overlying small pleural effusions. The opacities are slightly less dense in the upper lung fields today but are otherwise not significantly changed. Stable cardiac size with central vascular prominence. Stable mediastinum. Right IJ central line terminates in the distal SVC, tracheostomy cannula tip in between the heads of the clavicles. All are unchanged. IMPRESSION: 1. Diffuse heterogeneous bilateral airspace disease with a somewhat basal gradient and overlying small pleural effusions. The opacities are slightly less dense in the upper lung fields today but are otherwise not significantly changed. 2. Stable cardiac size with central vascular prominence. 3. Stable support apparatus. Electronically Signed   By: Francis Quam M.D.   On: 09/13/2023 06:00    Medications: Infusions:   prismasol  BGK 4/2.5 400 mL/hr at 09/13/23 2210    prismasol  BGK 4/2.5 400 mL/hr at 09/14/23 0759   fentaNYL  infusion INTRAVENOUS 200 mcg/hr (09/14/23 0900)   meropenem  (MERREM ) IV Stopped (09/14/23 0542)   midazolam  5 mg/hr (09/14/23 0900)   norepinephrine  (LEVOPHED ) Adult infusion 3 mcg/min (  09/14/23 0900)   prismasol  BGK 4/2.5 1,800 mL/hr at 09/14/23 0910   propofol  (DIPRIVAN ) infusion 30 mcg/kg/min (09/14/23 0922)   vancomycin       Scheduled Medications:  sodium chloride    Intravenous Once   sodium chloride    Intravenous  Once   albuterol   2.5 mg Nebulization Q6H   amiodarone   200 mg Per Tube Daily   ascorbic acid   250 mg Per Tube BID   Chlorhexidine  Gluconate Cloth  6 each Topical Daily   darbepoetin (ARANESP ) injection - NON-DIALYSIS  40 mcg Subcutaneous Q Sun-1800   feeding supplement (JEVITY 1.5 CAL/FIBER)  237 mL Per Tube 6 X Daily   feeding supplement (PROSource TF20)  60 mL Per Tube QID   fiber supplement (BANATROL TF)  60 mL Per Tube TID   guaiFENesin   15 mL Per Tube QID   insulin  aspart  0-15 Units Subcutaneous Q4H   leptospermum manuka honey  1 Application Topical Daily   mupirocin  ointment   Nasal BID   mouth rinse  15 mL Mouth Rinse Q2H   pantoprazole  (PROTONIX ) IV  40 mg Intravenous Q12H   terbinafine    Topical BID    have reviewed scheduled and prn medications.  Physical Exam: General:NAD,  trach on vent, sedated Heart:RRR, s1s2 nl Lungs: Coarse breath sound. Abdomen:soft, non-distended Extremities:No edema Dialysis Access: Left femoral temporary HD catheter in place.  Joyous Gleghorn Prasad Bellamie Turney 09/14/2023,9:46 AM  LOS: 31 days

## 2023-09-14 NOTE — Progress Notes (Signed)
 NAME:  Junaid Wurzer, MRN:  [id number], DOB:  Aug 18, 1956, LOS: 31 ADMISSION DATE:  08/13/2023, CONSULTATION DATE:  12/13 REFERRING MD:  Dr. Yolande, CHIEF COMPLAINT:  GIB   History of Present Illness:  Patient is a 68 yo M w/ pertinent PMH chronic respiratory failure w/ tracheostomy/[facility], Afib on eliquis  presents to [facility] ED on 12/13 w/ GIB.  Patient recently admitted w/ respiratory failure and pna in October 2024 requiring tracheostomy. [facility] was [facility]. Later that day had an accidental trach dislodgement. His wob continued to increase requiring re-admission to [facility] on 10/31 and was intubated and later trached during hospital admission. [facility].   On 12/13 patient admitted to [facility] w/ drop in hgb and concern for GIB. GI consulted for possible endoscopy. TRH to admit and PCCM to consult for vent management.  Pertinent  Medical History   Past Medical History:  Diagnosis Date   Anxiety    Cancer (HCC)    Depression    Hypertension    OCD (obsessive compulsive disorder)    Significant Hospital Events: Including procedures, antibiotic start and stop dates in addition to other pertinent events   12/13 admitted w/ GIB. Pccm to consult for vent management 12/14 1 unit pRBC 12/15 EGD - erosion around PEG site but no active bleeding; cratered ulcer biopsied near peg site.  12/16 plugging with clots- posterior wall bleeding from distal end of trach. Hemoptysis secondary to tracheostomy malpositioning.  This is resolved. Patient had originally been on IV heparin  for DVT.  Had tracheostomy in place, and developed fairly significant hemoptysis back on 12/16.  Bronchoscopy identified the distal tip of the ostomy abutting the posterior wall of the trachea with small amount of ulceration.  He received TXA, heavy sedation, anticoagulation was held. 12/23 family fired pccm 12/24 pccm asked to re-engage  by ENT via RN staff .  12/29 # 7 adjustable length cuffed biovona placed set at 110 mm wife updated  12/30 Bedside bronchoscopy with BAL of RML 12/31 Worsening status with difficulty to ventilate, sedated, paralyzed, trach repositioned.  Started meropenem  for Serratia HAP 1/1 follow up for  biovona adjustable trach, still could not achieve optimal positioning with distal tip at posterior wall.  Ultimately required #6 proximal XLT Brovona changed trach to 6 prox xlt. Excellent mid-line positioning. Improved gas exchange.  1/2 was doing well. NMB discontinued. CXR was looking better. We were escalating diuretics. Had sudden desaturation, Nursing reports large plugs removed but remained hypoxic. RT was NOT able to pass suction cath when they arrived to assist. Also not able to provide manual BMV.  This progressed to bradycardic and then PEA cardiac arrest. Had 1 round ~ 3 minutes CPR. During this time Trach removed (as could not pass) intubated w/ # 7.5 ETT from above.  1/3  Weaning pressors. No events overnight  1/4 Dr. Geronimo spoke to Dr Maximino of CCM at [facility]- says no beds in [place] or [place] and declined transfer + lateral transfer -> they also called back later and said they are removing [name] from their list and for us  to call back in  >1 weeks to see if bed available but they are not considering [name] for acceptance  1/5 No acute issues overnight, remains on fent and prop but off pressors.  1/7 remains intubated/sedated; drop in hgb requiring 2u PRBCs; [facility] denied transfer for tracheopexy procedure; family meeting discussed and  plan on trach tomorrow 1/8 yesterday trached requiring 8 bovana; 8 shiley and 8 proximal xlt placed but significant cuff leak 1/10 CRRT 1/12 bronch for secretion cleanout, bivona adjusted, BAL LLL 1/13 bronch, bivona advanced, ENT suggested positional changes to pull trach off posterior wall.  Interim History / Subjective:  No vent issues  overnight.. Tmax 99.6.  Objective   Blood pressure (!) 122/45, pulse 70, temperature 99.6 F (37.6 C), temperature source Axillary, resp. rate (!) 30, height 6' 0.99 (1.854 m), weight 47 kg, SpO2 99%. CVP:  [9 mmHg-10 mmHg] 10 mmHg  Vent Mode: PRVC FiO2 (%):  [50 %-60 %] 60 % Set Rate:  [30 bmp] 30 bmp Vt Set:  [640 mL] 640 mL PEEP:  [5 cmH20] 5 cmH20 Plateau Pressure:  [27 cmH20-34 cmH20] 30 cmH20   Intake/Output Summary (Last 24 hours) at 09/14/2023 9287 Last data filed at 09/14/2023 0600 Gross per 24 hour  Intake 3249.26 ml  Output 4129 ml  Net -879.74 ml   Filed Weights   09/12/23 0314 09/13/23 0437 09/14/23 0402  Weight: 55 kg 54 kg 47 kg    Examination: Critically ill-appearing man lying in bed trached, on sedation, on mechanical ventilation C/AT, eyes anicteric Trach in place, extra gauze on the lower border of the flange.  No bleeding around stoma. Breathing comfortably on mechanical ventilation.  Peak pressures in the high 30s, plateaus in the low 30s.  Mild sticking of Ballard distal end of tracheostomy tube, able to fully advance.  Some bloody secretions with clot suctioned. S1-S2, regular rate and rhythm Abdomen soft, nontender Improving edema, significant muscle wasting Left femoral dialysis catheter, right IJ CVC RASS -5, intact cough, reactive pupils.  Resp culture: few PMN, no organisms> Fungus culture: NGTD Aspergillus BAL 0.03 BUN 21, Cr 1.24 on CRRT WBC 19.2 H/H 8.3/25.7 Platelets 89  MRSA nares positive  Resolved Hospital Problem list   Hypokalemia HTN AKI Hemoptysis (resolved) secondary to sxn trauma  Hyperbilirubinemia Petechial rash resolved Rash on knee-- treated for  ringworm w/ antifungal X 2 weeks Post cardiac arrest shock. 1/2  -Cardiac arrest precipitated by airway obstruction, ? mucous plugging, with residual hypotension 2/2 sedating meds Serratia HCAP  Assessment & Plan:  Persistent ARDS and multiorgan failure- continues to have  high minute ventilation needs due to high dead space, combination fluid, intrinsic lung injury, and possibly VTE Baseline COPD Tracheostomy malfunction- he has developed significant granulation tissue along his anterior wall; moreover, needs fairly wide lumen to clear secretions.  Best thing we have been able to do to date to seat the distal end and entire balloon inside tracheal lumen is a 8-0 adjustable bivona.  Intermittent leak issues ongoing Recurrent clotting of tracheostomy tube-- had posterior wall protrusion into the trachea that seems to not fit well with any trachs we have tried.  -Likely his anatomy predisposes him to having irritation along the posterior wall.  Discussed this with the ENT-made recommendations regarding positioning the shoulder roll to cause neck extension and adding extra gauze beneath the lower flange to help rock his tracheostomy tube anteriorly in his airway. -Holding TXA nebs-concerned that these may have been making some of the clotting at the distal end of his tracheostomy tube or - Saline lavage periodically per ENT recommendations to help prevent clotting. - Continue low tidal volume ventilation, weaning FiO2 as able - Continue VAP antibiotics for some leukocytosis and findings of purulent drainage on the left side on bronchoscopy 1/12. Following cultures guide antibiotics. -VAP prevention protocol -  PAD protocol for sedation - Mupirocin  for nasal decontamination of positive MRSA nares.  Unfortunately reinforces that vancomycin  is an appropriate choice. 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  H/H drop worsens with challenges with AC. -Unfortunately still need to hold anticoagulation due to ongoing tracheal bleeding intermittently. - Aranesp  per nephrology - Worry about clotting risk moving forward with critical illness and mobility limitations  Acute metabolic encephalopathy, ICU delirium-has been an ongoing issue for him this admission and has waxed and waned.  Suspect this is mostly related to chronic critical illness and needs for high sedation to maintain ventilator synchrony, sats, and ventilation -Discussed with family, goal is to get brain MRI in the next 24 hours.  They think it would be helpful for them to know if he has had a significant brain injury. -PAD protocol, goal RASS -4-5 until tracheostomy bleeding issues are more controlled - CRRT for metabolic clearance.  Trying to time MRI around the time of filter change would need to occur. - Family has been very involved and diligently at bedside which is helpful with delirium. - Mobility as able as he recovers -Per family recommendations have ensured that Seroquel  and sertraline  are no longer prescribed.  PICC associated DVT- seems to have disappeared on f/u scan on 09/03/23; LE duplex neg 08/14/23, 09/11/23 -Continue holding anticoagulation until bleeding is controlled  Paroxysmal Afib, suspect this is related to his severe mitral regurgitation, critical illness.  Today in normal sinus rhythm. -Unfortunately still cannot safely anticoagulate -Oral amiodarone  for rate control - Telemetry monitoring.  Stage IV sacral ulcer POA- CCS and WOC have evaluated.   -Optimize nutrition, frequent turns. -Dressing changes  per wound care recommendations  Hyperglycemia due to critical illness -Sliding scale insulin  as needed - Goal blood glucose 140-180  Severe protein energy malnutrition diarrhea -Tube feeds, protein supplements - Continue Banatrol to help with diarrhea  Prolonged critical illness, nutrition issues, clotting and bleeding issues, advanced valvular disease portend a poor prognosis.  Changes for today Continue volume removal with CRRT Brain MRI-trying to plan around a CRRT circuit change Continue broad-spectrum antibiotics Continue position changes and tracheostomy lavage per ENTs recommendations to minimize bleeding Monitor vent closely, worry he may reocclude trach. Airway exchange catheter left at bedside in case we are unable to ventilate.  Wife present at bedside and her sister in law was involved via phone meeting per IPL note.  Best Practice (right click and Reselect all SmartList Selections daily)   Diet/type: tubefeeds DVT prophylaxis SCDs Pressure ulcer(s): present on admission  GI prophylaxis: PPI Lines: Central line, HD catheter Foley:  d/c 1/13 Code Status:  full code Last date of multidisciplinary goals of care discussion Wife updated at bedside 1/13 at bedside  Critical care time: 35 min  Leita SHAUNNA Gaskins, DO 09/14/23 7:12 AM [facility]  For contact information, see Amion. If no response to pager, please call PCCM  consult pager. After hours, 7PM- 7AM, please call Elink.

## 2023-09-14 NOTE — IPAL (Signed)
  Interdisciplinary Goals of Care Family Meeting   Date carried out:: 09/14/2023  Location of the meeting: Bedside  Member's involved: Physician, Bedside Registered Nurse, and Family Member or next of kin  Durable Power of Attorney or acting medical decision maker: wife    Discussion: We discussed goals of care for [name] .  I met with [name] and her sister in law was on speakerphone. We reviewed his prolonged critical illness and they voiced concerns regarding his care at different points. We reviewed that he likely has valvular Afib, choice of anticoagulants and the recommendation for DOAC to achieve more time therapeutic with less bleeding complications in studies. She voiced concern over seroquel  use and multiple times requested that anesthetic gases be used. She requested an anesthesia consult for his care. We reviewed that there is likely insufficient data on inhaled anesthetics to safely use them for days-weeks-months at a time, and in our institution they are not available outside of an operating room. Surgical critical care patients that get inhaled anesthetics still transition to IV and enteral pain regimens post-op and do not return to ICU on inhaled anesthetics. I echoed her concern about his delirium risk and the delirium risk of almost all meds, most certainly benzos. She thinks Xanax has always worked best for him and specifically asked for SSRI and seroquel  to be discontinued for him. I acknowledged both of their frustrations that he has not had a linear pathway out of critical illness and has unfortunately been very complicated. They understand my concern about his progressive valve disease, and his sister remains hopeful that he will get better enough to get to valve surgery. She feels confident that he can get to this based on her experience at [facility].   Code status: Full Code  Disposition: Continue current acute care   Time spent for the meeting: 65 min.  Joshua Eaton 09/14/2023, 8:46 AM

## 2023-09-14 NOTE — Progress Notes (Signed)
 Orthopedic Tech Progress Note Patient Details:  Keaten Mashek 1955-09-26 [id number]  Ortho Devices Type of Ortho Device: Ace wrap, Unna boot Ortho Device/Splint Location: BLE Ortho Device/Splint Interventions: Ordered, Application, Adjustment   Post Interventions Patient Tolerated: Well Instructions Provided: Care of device  Delanna LITTIE Pac 09/14/2023, 1:39 PM

## 2023-09-15 DIAGNOSIS — J9601 Acute respiratory failure with hypoxia: Secondary | ICD-10-CM | POA: Diagnosis not present

## 2023-09-15 DIAGNOSIS — Z9911 Dependence on respirator [ventilator] status: Secondary | ICD-10-CM | POA: Diagnosis not present

## 2023-09-15 DIAGNOSIS — R042 Hemoptysis: Secondary | ICD-10-CM | POA: Diagnosis not present

## 2023-09-15 DIAGNOSIS — Z930 Tracheostomy status: Secondary | ICD-10-CM | POA: Diagnosis not present

## 2023-09-15 DIAGNOSIS — G928 Other toxic encephalopathy: Secondary | ICD-10-CM

## 2023-09-15 LAB — MAGNESIUM: Magnesium: 2.5 mg/dL — ABNORMAL HIGH (ref 1.7–2.4)

## 2023-09-15 LAB — RENAL FUNCTION PANEL
Albumin: 2.2 g/dL — ABNORMAL LOW (ref 3.5–5.0)
Anion gap: 9 (ref 5–15)
BUN: 36 mg/dL — ABNORMAL HIGH (ref 8–23)
CO2: 25 mmol/L (ref 22–32)
Calcium: 8.5 mg/dL — ABNORMAL LOW (ref 8.9–10.3)
Chloride: 102 mmol/L (ref 98–111)
Creatinine, Ser: 1.45 mg/dL — ABNORMAL HIGH (ref 0.61–1.24)
GFR, Estimated: 52 mL/min — ABNORMAL LOW (ref >60)
Glucose, Bld: 142 mg/dL — ABNORMAL HIGH (ref 70–99)
Phosphorus: 4.1 mg/dL (ref 2.5–4.6)
Potassium: 5 mmol/L (ref 3.5–5.1)
Sodium: 136 mmol/L (ref 135–145)

## 2023-09-15 LAB — GLUCOSE, CAPILLARY
Glucose-Capillary: 142 mg/dL — ABNORMAL HIGH (ref 70–99)
Glucose-Capillary: 138 mg/dL — ABNORMAL HIGH (ref 70–99)
Glucose-Capillary: 105 mg/dL — ABNORMAL HIGH (ref 70–99)

## 2023-09-15 LAB — CBC
HCT: 25.8 % — ABNORMAL LOW (ref 39.0–52.0)
Hemoglobin: 8.1 g/dL — ABNORMAL LOW (ref 13.0–17.0)
MCH: 29.6 pg (ref 26.0–34.0)
MCHC: 31.4 g/dL (ref 30.0–36.0)
MCV: 94.2 fL (ref 80.0–100.0)
Platelets: 74 10*3/uL — ABNORMAL LOW (ref 150–400)
RBC: 2.74 MIL/uL — ABNORMAL LOW (ref 4.22–5.81)
RDW: 18 % — ABNORMAL HIGH (ref 11.5–15.5)
WBC: 15.3 10*3/uL — ABNORMAL HIGH (ref 4.0–10.5)
nRBC: 0.2 % (ref 0.0–0.2)

## 2023-09-15 MED ORDER — GLYCOPYRROLATE 0.2 MG/ML IJ SOLN
0.2000 mg | INTRAMUSCULAR | Status: DC | PRN
  Administered 2023-09-15: 0.2 mg via INTRAVENOUS
  Filled 2023-09-15: qty 1

## 2023-09-15 MED ORDER — TRANEXAMIC ACID-NACL 1000-0.7 MG/100ML-% IV SOLN
1000.0000 mg | Freq: Once | INTRAVENOUS | Status: AC
  Administered 2023-09-15: 1000 mg via INTRAVENOUS
  Filled 2023-09-15: qty 100

## 2023-09-15 MED ORDER — HALOPERIDOL LACTATE 5 MG/ML IJ SOLN
2.5000 mg | INTRAMUSCULAR | Status: DC | PRN
Start: 2023-09-15 — End: 2023-09-16

## 2023-09-15 MED ORDER — GLYCOPYRROLATE 0.2 MG/ML IJ SOLN: 0.2000 mg | INTRAMUSCULAR | Status: DC | PRN

## 2023-09-15 MED ORDER — POLYVINYL ALCOHOL 1.4 % OP SOLN: 1.0000 [drp] | Freq: Four times a day (QID) | OPHTHALMIC | Status: DC | PRN

## 2023-09-15 MED ORDER — DIPHENHYDRAMINE HCL 50 MG/ML IJ SOLN: 25.0000 mg | INTRAMUSCULAR | Status: DC | PRN

## 2023-09-15 MED ORDER — GLYCOPYRROLATE 1 MG PO TABS: 1.0000 mg | ORAL_TABLET | ORAL | Status: DC | PRN

## 2023-09-15 MED ORDER — SODIUM CHLORIDE 0.9 % IV SOLN
15.0000 ug | Freq: Once | INTRAVENOUS | Status: AC
  Administered 2023-09-15: 15.2 ug via INTRAVENOUS
  Filled 2023-09-15: qty 3.8

## 2023-09-16 LAB — VITAMIN A: Vitamin A (Retinoic Acid): 21.2 ug/dL — ABNORMAL LOW (ref 22.0–69.5)

## 2023-09-17 LAB — PANCREATIC ELASTASE, FECAL: Pancreatic Elastase-1, Stool: 438 ug Elast./g (ref >200)

## 2023-09-29 LAB — FUNGUS CULTURE WITH STAIN

## 2023-09-29 LAB — FUNGUS CULTURE RESULT

## 2023-09-29 LAB — FUNGAL ORGANISM REFLEX

## 2023-10-02 NOTE — Procedures (Signed)
 Bronchoscopy Procedure Note  Adnaan Mowad  [id number]  October 17, 1955  Date:09/17/23  Time:8:19 AM   Provider Performing:Zayvian Mcmurtry P [place] Job   Procedure(s):  Flexible Bronchoscopy [phone])  Indication(s) Mucus plugging, unable to advance ballard  Consent Risks of the procedure as well as the alternatives and risks of each were explained to the patient and/or caregiver.  Consent for the procedure was obtained and is signed in the bedside chart  Anesthesia Per MAR   Time Out Wife present throughout procedure.    Sterile Technique Usual hand hygiene, masks, gowns, and gloves were used   Procedure Description Bronchoscope advanced through endotracheal tube and into airway.  Airways were examined down to subsegmental level with findings noted below.   Following diagnostic evaluation, clots suctioned out of R mainstem, BI, RLL bronchus. Some nonocclusive residual clot persistent in RLL bronchus that was not able to be removed with lavage.  Left bronchi without clots, mildly erythematous mucosa  Findings: see above.   Complications/Tolerance None; patient tolerated the procedure well. Chest X-ray is not needed post procedure.   EBL Minimal   Specimen(s) None  Joesph Mussel, DO 2023/09/17 8:22 AM [facility]  For contact information, see Amion. If no response to pager, please call PCCM consult pager. After hours, 7PM- 7AM, please call Elink.

## 2023-10-02 NOTE — IPAL (Signed)
  Interdisciplinary Goals of Care Family Meeting   Date carried out:: 09/19/2023  Location of the meeting: Bedside  Member's involved: Physician, Bedside Registered Nurse, and Family Member or next of kin  Durable Power of Attorney or acting medical decision maker: wife    Discussion: We discussed goals of care for Joshua Eaton .  I met with Joshua Eaton with RN. She had spoken to neurology earlier and their prognosis was worse than what she and I had discussed. Joshua Eaton, her sister in law, and Dr. Bonnita Buttner had spoken earlier about his MOF and severe MRI findings. Joshua Eaton and her sister in law are planning on converting to comfort care when son & sister arrive. Chaplain has previously been paged for her. We discussed how we transition to comfort-focused care and that we will aggressively focus on symptom control and family having full access to him. Joshua Eaton does not yet feel comfortable changing his code status, but we discussed this will be necessary before he is transitioned to comfort care and disconnected from the vent.   Code status: Full Code  Disposition: Continue current acute care; no plans for escalation forthcoming since plans to transition to comfort later after SIL and son arrive.   Time spent for the meeting: 10 min.  Joesph Mussel Sep 19, 2023, 12:59 PM

## 2023-10-02 NOTE — IPAL (Signed)
  Interdisciplinary Goals of Care Family Meeting   Date carried out:: 10/13/2023  Location of the meeting: Bedside  Member's involved: Physician, Bedside Registered Nurse, and Family Member or next of kin  Durable Power of Attorney or acting medical decision maker: wife    Discussion: We discussed goals of care for Joshua Eaton .  Wife, son, sister at bedside. They confirmed he would want to proceed with comfort care if there was no chance of meaningful recovery. We reviewed that he would come off vent, leave trach in place, and we will focus only on symptom control with medications. RN present throughout discussion, discussed with RT.   Code status: Full DNR  Disposition: In-patient comfort care   Time spent for the meeting: 10 min.  Joesph Mussel 2023-10-13, 3:16 PM

## 2023-10-02 NOTE — Progress Notes (Signed)
 [facility] NEPHROLOGY PROGRESS NOTE  Assessment/ Plan: Pt is a 68 y.o. 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  #Acute kidney injury: Anuric, suspected to be ischemic ATN.  C3, C4, ANA, anti-GBM and ANCA negative. Renal with normal echogenicity and no hydro.  He was started on CRRT on 1/10 after nontunneled catheter with critical care. Labs are improved and volume status optimized.  He is off of pressor.  We will go ahead and stop CRRT today which was discussed with the patient's wife and ICU team.  Monitor urine output and daily lab.  We will try to do intermittent HD in next 1 to 2 days.   # Normocytic Anemia: Continue ESA and monitor hemoglobin.  Transfuse as needed.  # Acute on chronic hypercarbic/hypoxic respiratory failure:  - Ventilator and tracheostomy dependent for the past 2 months.  Current picture complicated by [facility] healthcare associated pneumonia/ARDS.   - s/p cefepime .  - abx and vent per critical care    # Paroxysmal atrial fibrillation:  - per primary team    #  Anasarca: failed diuretic; optimized volume as above with CRRT    # Hypophosphatemia due to CRRT and replete as needed.  Discussed with the patient's wife and ICU staff's.  Subjective: Seen and examined ICU.  Tolerating CRRT well.  Off of pressor.  Remains intubated and sedated.  He had a bronchoscopy with pulmonary today.  Discussed with patient's wife and the ICU provider.  Objective Vital signs in last 24 hours: Vitals:   10-13-2023 0830 2023/10/13 0845 Oct 13, 2023 0900 10/13/23 0915  BP: (!) 145/60 (!) 145/59 (!) 137/56 (!) 141/57  Pulse: 89 88 90 88  Resp: (!) 30 (!) 30 (!) 30 (!) 30  Temp:      TempSrc:      SpO2: 99% 98% 96% 95%  Weight:      Height:       Weight  change: -3 kg  Intake/Output Summary (Last 24 hours) at 2023-10-13 1040 Last data filed at October 13, 2023 1000 Gross per 24 hour  Intake 3268.75 ml  Output 5229 ml  Net -1960.25 ml       Labs: RENAL PANEL Recent Labs  Lab 09/11/23 0405 09/11/23 1056 09/12/23 0411 09/12/23 1210 09/13/23 0326 09/13/23 0327 09/13/23 1745 09/14/23 0359 09/14/23 1615 10/13/2023 0411  NA 141   < > 137   < > 135  --  135 137 136 136  K 4.6   < > 4.1   < > 4.1  --  4.3 4.5 4.6 5.0  CL 99   < > 99   < > 101  --  101 103 102 102  CO2 29   < > 27   < > 24  --  24 26 26 25   GLUCOSE 128*   < > 110*   < > 117*  --  133* 131* 138* 142*  BUN 74*   < > 44*   < > 29*  --  24* 21 23 36*  CREATININE 2.62*   < > 1.67*   < > 1.29*  --  1.32* 1.24 1.17 1.45*  CALCIUM 8.9   < > 8.7*   < > 8.5*  --  8.2* 8.5* 8.4* 8.5*  MG 2.8*  --  2.6*  --   --  2.5*  --  2.6*  --  2.5*  PHOS 5.1*   < > 2.3*   < > 2.4*  --  2.7 2.5 2.8 4.1  ALBUMIN  2.7*   < > 2.6*   < > 2.2*  --  2.1* 2.2* 2.2* 2.2*   < > = values in this interval not displayed.    Liver Function Tests: Recent Labs  Lab 09/14/23 0359 09/14/23 1615 17-Sep-2023 0411  ALBUMIN  2.2* 2.2* 2.2*   No results for input(s): "LIPASE", "AMYLASE" in the last 168 hours. No results for input(s): "AMMONIA" in the last 168 hours. CBC: Recent Labs    06/04/23 1311 06/05/23 0209 09/08/23 1330 09/08/23 1729 09/12/23 0411 09/12/23 1210 09/13/23 0327 09/14/23 0359 09-17-2023 0411  HGB  --    < >  --    < > 7.8* 7.8* 8.2* 8.3* 8.1*  MCV  --    < >  --    < > 94.7  --  93.5 92.8 94.2  VITAMINB12 455  --  802  --   --   --   --   --   --   FOLATE 5.0*  --  9.9  --   --   --   --   --   --   FERRITIN  --   --  2,200*  --   --   --   --   --   --   TIBC  --   --  168*  --   --   --   --   --   --   IRON  --   --  40*  --   --   --   --   --   --   RETICCTPCT  --   --  2.3  --   --   --   --   --   --    < > = values in this interval not displayed.    Cardiac Enzymes: No  results for input(s): "CKTOTAL", "CKMB", "CKMBINDEX", "TROPONINI" in the last 168 hours. CBG: Recent Labs  Lab 09/14/23 1118 09/14/23 1516 09/14/23 1922 09/14/23 2320 09-17-2023 0332  GLUCAP 138* 121* 123* 113* 142*    Iron Studies: No results for input(s): "IRON", "TIBC", "TRANSFERRIN", "FERRITIN" in the last 72 hours. Studies/Results: MR BRAIN WO CONTRAST Result Date: 09/17/2023 CLINICAL DATA:  Encephalopathy status post cardiac arrest EXAM: MRI HEAD WITHOUT CONTRAST TECHNIQUE: Multiplanar, multiecho pulse sequences of the brain and surrounding structures were obtained without intravenous contrast. COMPARISON:  06/21/2023 FINDINGS: Brain: No acute infarct, mass effect or extra-axial collection. There are innumerable chronic microhemorrhages in a mixed central and peripheral distribution. This has markedly worsened since 06/21/2023. There is multifocal hyperintense T2-weighted signal within the white matter. Parenchymal volume and CSF spaces are normal. The midline structures are normal. Vascular: Normal flow voids. Skull and upper cervical spine: Normal calvarium and skull base. Visualized upper cervical spine and soft tissues are normal. Sinuses/Orbits:Bilateral mastoid effusions. Paranasal sinuses are clear. Normal orbits. IMPRESSION: 1. No acute intracranial abnormality. 2. Innumerable chronic microhemorrhages in a mixed central and peripheral distribution, markedly worsened since 06/21/2023. This may be due to hypertension, amyloid angiopathy, coagulopathy or other vascular insult. Electronically Signed   By: Juanetta Nordmann M.D.   On: September 17, 2023 01:05    Medications: Infusions:   prismasol  BGK 4/2.5 400 mL/hr at 09/14/23 1125    prismasol  BGK 4/2.5 400 mL/hr at 09/14/23 2045   cefTRIAXone  (ROCEPHIN )  IV Stopped (09/14/23  1533)   desmopressin  (DDAVP ) 15.2 mcg in sodium chloride  0.9 % 50 mL IVPB 15.2 mcg (10/13/23 1017)   fentaNYL  infusion INTRAVENOUS 25 mcg/hr (October 13, 2023 1000)   midazolam  3  mg/hr (Oct 13, 2023 1000)   norepinephrine  (LEVOPHED ) Adult infusion 3 mcg/min (17-Jan-2024 0100)   prismasol  BGK 4/2.5 1,800 mL/hr at 10/13/23 4098   propofol  (DIPRIVAN ) infusion 30 mcg/kg/min (Oct 13, 2023 1000)    Scheduled Medications:  sodium chloride    Intravenous Once   sodium chloride    Intravenous Once   albuterol   2.5 mg Nebulization Q6H   amiodarone   200 mg Per Tube Daily   ascorbic acid   250 mg Per Tube Daily   Chlorhexidine  Gluconate Cloth  6 each Topical Daily   darbepoetin (ARANESP ) injection - NON-DIALYSIS  40 mcg Subcutaneous Q Sun-1800   feeding supplement (JEVITY 1.5 CAL/FIBER)  237 mL Per Tube 6 X Daily   feeding supplement (PROSource TF20)  60 mL Per Tube QID   fiber supplement (BANATROL TF)  60 mL Per Tube TID   guaiFENesin   15 mL Per Tube QID   insulin  aspart  0-15 Units Subcutaneous Q4H   leptospermum manuka honey  1 Application Topical Daily   loperamide  HCl  1 mg Per Tube BID   mupirocin  ointment   Nasal BID   mouth rinse  15 mL Mouth Rinse Q2H   pantoprazole  (PROTONIX ) IV  40 mg Intravenous Q12H   terbinafine    Topical BID    have reviewed scheduled and prn medications.  Physical Exam: General:NAD,  trach on vent, sedated Heart:RRR, s1s2 nl Lungs: Coarse breath sound. Abdomen:soft, non-distended Extremities:No edema Dialysis Access: Left femoral temporary HD catheter in place.  Terry Bolotin Prasad Taiylor Virden 2023-10-13,10:40 AM  LOS: 32 days

## 2023-10-02 NOTE — Progress Notes (Signed)
 NAME:  Joshua Eaton, MRN:  [id number], DOB:  1955/10/27, LOS: 32 ADMISSION DATE:  08/13/2023, CONSULTATION DATE:  12/13 REFERRING MD:  Dr. Efraim Grange, CHIEF COMPLAINT:  GIB   History of Present Illness:  Patient is a 68 yo M w/ pertinent PMH chronic respiratory failure w/ tracheostomy/[facility], Afib on eliquis  presents to [facility] ED on 12/13 w/ GIB.  Patient recently admitted w/ respiratory failure and pna in October 2024 requiring tracheostomy. He was later [facility]. Later that day had an accidental trach dislodgement. His wob continued to increase requiring re-admission to [facility] on 10/31 and was intubated and later trached during hospital admission. Patient discharged [facility].   On 12/13 patient admitted to [facility] w/ drop in hgb and concern for GIB. GI consulted for possible endoscopy. TRH to admit and PCCM to consult for vent management.  Pertinent  Medical History   Past Medical History:  Diagnosis Date   Anxiety    Cancer (HCC)    Depression    Hypertension    OCD (obsessive compulsive disorder)    Significant Hospital Events: Including procedures, antibiotic start and stop dates in addition to other pertinent events   12/13 admitted w/ GIB. Pccm to consult for vent management 12/14 1 unit pRBC 12/15 EGD - erosion around PEG site but no active bleeding; cratered ulcer biopsied near peg site.  12/16 plugging with clots- posterior wall bleeding from distal end of trach. Hemoptysis secondary to tracheostomy malpositioning.  This is resolved. Patient had originally been on IV heparin  for DVT.  Had tracheostomy in place, and developed fairly significant hemoptysis back on 12/16.  Bronchoscopy identified the distal tip of the ostomy abutting the posterior wall of the trachea with small amount of ulceration.  He received TXA, heavy sedation, anticoagulation was held. 12/23 family fired pccm 12/24 pccm asked to re-engage  by ENT via RN staff .  12/29 # 7 adjustable length cuffed biovona placed set at 110 mm wife updated  12/30 Bedside bronchoscopy with BAL of RML 12/31 Worsening status with difficulty to ventilate, sedated, paralyzed, trach repositioned.  Started meropenem  for [facility] HAP 1/1 follow up for  biovona adjustable trach, still could not achieve optimal positioning with distal tip at posterior wall.  Ultimately required #6 proximal XLT Brovona changed trach to 6 prox xlt. Excellent mid-line positioning. Improved gas exchange.  1/2 was doing well. NMB discontinued. CXR was looking better. We were escalating diuretics. Had sudden desaturation, Nursing reports large plugs removed but remained hypoxic. RT was NOT able to pass suction cath when they arrived to assist. Also not able to provide manual BMV.  This progressed to bradycardic and then PEA cardiac arrest. Had 1 round ~ 3 minutes CPR. During this time Trach removed (as could not pass) intubated w/ # 7.5 ETT from above.  1/3  Weaning pressors. No events overnight  1/4 Dr. Bertrum Brodie spoke to Dr Beryle Brood of [facility] at [facility]- says no beds in [place] or [place] and declined transfer + lateral transfer -> they also called back later and said they are removing [facility] from their list and for us  to call back in  >1 weeks to see if bed available but they are not considering [facility] for acceptance  1/5 No acute issues overnight, remains on fent and prop but off pressors.  1/7 remains intubated/sedated; drop in hgb requiring 2u PRBCs; [facility] denied transfer for tracheopexy procedure; family meeting discussed and  plan on trach tomorrow 1/8 yesterday trached requiring 8 bovana; 8 shiley and 8 proximal xlt placed but significant cuff leak 1/10 CRRT 1/12 bronch for secretion cleanout, bivona adjusted, BAL LLL 1/13 bronch, bivona advanced, ENT suggested positional changes to pull trach off posterior wall.  Interim History / Subjective:  Overnight had CRRT  paused for MRI and machine switch. No tracheal clotting issues.   Objective   Blood pressure (!) 130/48, pulse 84, temperature 98.8 F (37.1 C), temperature source Oral, resp. rate (!) 30, height 6' 0.99" (1.854 m), weight 44 kg, SpO2 99%. CVP:  [9 mmHg-10 mmHg] 10 mmHg  Vent Mode: PRVC FiO2 (%):  [60 %] 60 % Set Rate:  [30 bmp] 30 bmp Vt Set:  [640 mL] 640 mL PEEP:  [5 cmH20] 5 cmH20 Plateau Pressure:  [20 cmH20-30 cmH20] 29 cmH20   Intake/Output Summary (Last 24 hours) at Oct 03, 2023 1610 Last data filed at 10-03-23 0700 Gross per 24 hour  Intake 3232.37 ml  Output 5524 ml  Net -2291.63 ml   Filed Weights   09/13/23 0437 09/14/23 0402 10/03/2023 0500  Weight: 54 kg 47 kg 44 kg    Examination: Critically ill appearing man lying in bed in NAD [place]/At, eyes anicteric Breathing comfortably on MV, Pplat 32, Ppeak 39. Post bronch Pplat 28, peak 30. CTAB. Prior to bronch unable to fully advanvce ballard. S1S2, RRR Abd soft, NT No cyanosis, improving edema Ecchymoses on skin RASS -4, biting on suction catheter, + cough. PERRL.    Resp culture: few PMN, no organisms> normal flora Fungus culture: No growth to date BUN 36, Cr 1.45 on CRRT WBC 15.3 H/H 8.1/25.8 Platelets 74 Blood glucose 100s to 140s  MRI brain: no acute findings, multiple chronic microhemorrhages centrally and peripherally, worse since 06/2023.   Resolved Hospital Problem list   Hypokalemia HTN AKI Hemoptysis (resolved) secondary to sxn trauma  Hyperbilirubinemia Petechial rash resolved Rash on knee-- treated for  ringworm w/ antifungal X 2 weeks Post cardiac arrest shock. 1/2  -Cardiac arrest precipitated by airway obstruction, ? mucous plugging, with residual hypotension 2/2 sedating meds Serratia HCAP  Assessment & Plan:  Persistent ARDS and multiorgan failure- continues to have high minute ventilation needs due to high dead space, combination fluid, intrinsic lung injury, and possibly VTE Baseline  COPD Tracheostomy malfunction- he has developed significant granulation tissue along his anterior wall; moreover, needs fairly wide lumen to clear secretions.  Best thing we have been able to do to date to seat the distal end and entire balloon inside tracheal lumen is a 8-0 adjustable bivona.  Intermittent leak issues ongoing Recurrent clotting of tracheostomy tube-- had posterior wall protrusion into the trachea that seems to not fit well with any trachs we have tried. Better on bronch Oct 03, 2023 with position changes suggested- shoulder roll, extra gauze under lower flange of trach -con't shoulder roll for neck extension and putting extra gauze  -Holding TXA nebs-concerned that these may have been making some of the clotting at the distal end of his tracheostomy tube worse. Still having some oozing now causing clotting in bronchi more than distal trachea.  - Saline lavage periodically per ENT recommendations to help prevent clotting> still recommend that. So far appears to be needing bronchs every few days -discussed risks vs benefits of systemic meds to hopefully help control bleeding- TXA & DDAVP .  I have worried a lot about his clot risk, but his ongoing tracheal bleeding. Right now his most life limiting condition would be not  ventilating due to ongoing bleeding and clotting issues in his airway.  Discussed with one of my partners that agrees with the chronicity of this issue for several weeks now, the benefit is likely greater than the clotting risk. - con't LTVV; meeting goal Pplat <30 -Con't VAP prevention rpotocol -ceftriaxone  to complete 7 days of antibiotics based on cultures with normal flora. WBC downtrending.  -PAD protocol- slowly decreasing sedation; restart fentanyl , need to work on coming off midazolam  first.  - Mupirocin  for nasal decontamination of positive MRSA nares.  Unfortunately reinforces that vancomycin  is an appropriate choice. -Con't bronchodilators.   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  - Poor prognosis if these are unable to be corrected due to the progressive nature of valve disease  Renal failure with volume overload Significant third spacing/ anasarca -CRRT; can switch to iHD per Nephro when able now that he is off pressors.  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  H/H drop worsens with challenges with AC.  Hemoglobin relatively stable over the last 3 days despite ongoing mild tracheal bleeding. -Unfortunately still needing AC to be held due to tracheal bleeding issues.  -discussed with wife and one of my partners TXA & DDAVP . Clotting remains a risk, but ongoing bleeding into his airway remains the greater immediate threat to his life.  -Aranesp  per nephrology -Long term still high DVT risk with immobility and critical illness.   Acute metabolic encephalopathy, ICU delirium-has been an ongoing issue for him this admission and has waxed and waned.  Suspect this is mostly related to chronic critical illness and needs for high sedation to maintain ventilator synchrony, sats, and ventilation Abnormal brain MRI, progressive since 06/2023. Concern for likely amyloid angiopathy; less likely coagulopathy versus vascular insult versus hypertension.  Hypertension seems less likely given that he has been not requiring his home blood pressure medications over the last few months and his diastolic blood pressures have been low with his AI. -Discussed with family that if this is amyloid it is progressive, and  with the degree of involvement, he is likely to have cognitive decline with this.  -consulted neurology for assessment & prognostication -PAD protocol, goal RASS  -3 to -4 if vent synchrony is maintained. - CRRT for metabolic clearance. -family has been active in his care - mobility, PT, OT once he is able -Per family recommendations have ensured that Seroquel  and sertraline  are no longer prescribed.   PICC associated DVT- seems to have disappeared on f/u scan on 09/03/23; LE duplex neg 08/14/23, 09/11/23 -Continue holding anticoagulation until bleeding is controlled  Paroxysmal Afib, suspect this is related to his severe mitral regurgitation, critical illness.  Today in normal sinus rhythm. -Unfortunately still cannot safely anticoagulate -oral amiodarone  for rate control -tele monitoring  Stage IV sacral ulcer POA- CCS and WOC have evaluated.   -optimize nutrition, cover pressure points, frequent turns -dressing changes per wound care recommendations  Hyperglycemia due to critical illness -SSI PRN -goal BG 140-180  Severe protein energy malnutrition Diarrhea- suspect related to antibiotics, TF -tube feeds, protein supplements -fecal elastase pending to assess for pancreatic  insufficiency causing diarrhea - imodium  scheduled for 2 day trial -con't banatrol    Changes for today Neuro consult Con't ceftriaxone  Con't CRRT Position changes, tracheostomy lavage to prevent clotting. Still at risk for airway clotting.  Continue position changes and tracheostomy lavage per ENTs recommendations to minimize bleeding Airway exchange catheter left at bedside in case we are unable to ventilate.     Wife updated at bedside with RN present.   Best Practice (right click and "Reselect all SmartList Selections" daily)   Diet/type: tubefeeds DVT prophylaxis SCDs Pressure ulcer(s): present on admission  GI prophylaxis: PPI Lines: Central line, HD catheter Foley:  d/c 1/13 Code Status:   full code Last date of multidisciplinary goals of care discussion Wife updated at bedside 1/13 at bedside  Critical care time: 100  min separate from time spent on procedures.  Joesph Mussel, DO 22-Sep-2023 7:12 AM [facility]  For contact information, see Amion. If no response to pager, please call PCCM consult pager. After hours, 7PM- 7AM, please call Elink.

## 2023-10-02 NOTE — Progress Notes (Signed)
 Per Celeste Cola from [facility], patient is not a candidate for any organ donation.

## 2023-10-02 NOTE — Progress Notes (Signed)
 This chaplain responded to the unit's consult for emotional and spiritual care for the Pt. wife. The chaplain received an update from the Pt. RN-Ryan before the visit. The Pt. son, sister, and wife are at the bedside.  The family accepted the chaplain's presence and invitation for storytelling. The chaplain listened reflectively and understands the Pt. has the gift of humor and never a met a stranger. The Pt. loves his family through time together and laughter.   The chaplain affirmed God's love for the Pt. today and tomorrow. The family participated in prayer with the chaplain at the bedside.  This chaplain is available for F/U spiritual care as needed.  Chaplain Kathleene Papas [phone]

## 2023-10-02 NOTE — Progress Notes (Signed)
 RT and RT student, Tim assisted Dr. Fulton Job with bedside bronchoscopy.

## 2023-10-02 NOTE — Death Summary Note (Signed)
 DEATH SUMMARY   Patient Details  Name: Joshua Eaton MRN: [id number] DOB: [birth year]  Admission/Discharge Information   Admit Date:  2023/09/09  Date of Death: Date of Death: Oct 12, 2023  Time of Death: Time of Death: October 15, 1749  Length of Stay: 10-15-2030  Referring Physician: [name], MD   Reason(s) for Hospitalization  Acute blood loss anemia due to GI bleed  Diagnoses  Preliminary cause of death: acute respiratory failure Secondary Diagnoses (including complications and co-morbidities):  Principal Problem:   Acute respiratory failure with hypoxia (HCC) Active Problems:   Severe mitral regurgitation   On mechanically assisted ventilation (HCC)   Tracheostomy dependence (HCC)   Moderate aortic insufficiency   Malnutrition of moderate degree   PEG (percutaneous endoscopic gastrostomy) status (HCC)   Sacral ulcer (HCC)   Paroxysmal atrial fibrillation (HCC)   DVT (deep venous thrombosis) (HCC)   HTN (hypertension)   Rash   Pneumonia due to Serratia marcescens (HCC)   ARDS (adult respiratory distress syndrome) (HCC)   Tracheomalacia   Thrombocytopenia (HCC)  Persistent ARDS and multiorgan failure Baseline COPD Tracheostomy malfunction Recurrent clotting of tracheostomy tube Baseline severe valvular heart disease- leaflet perforation and severe MR, moderate AI Valvular A-fib Renal failure with volume overload Significant third spacing/ anasarca Recurrent ABLA- related to bone marrow failure, malnutrition, phlebotomy, tracheal suctioning, and ongoing slow losses through pressure injuries and probable slow GIB (duodenitis and antral ulcer on last EGD 08/15/23).  Acute metabolic encephalopathy, ICU delirium Abnormal brain MRI PICC associated DVT- present on admission Paroxysmal Afib, suspect this is related to his severe mitral regurgitation, critical illness. Stage IV sacral ulcer POA  Hyperglycemia due to critical illness Severe protein energy malnutrition Diarrhea-  suspect related to antibiotics, TF Cerebral amyloid angiopathy     Brief Hospital Course (including significant findings, care, treatment, and services provided and events leading to death)  Joshua Eaton is a 68 y.o. year old male w/ pertinent PMH chronic respiratory failure w/ tracheostomy/[facility], Afib on eliquis presents to [facility] ED on 09-08-24 w/ GIB.   Patient recently admitted w/ respiratory failure and pna in October 2024 requiring tracheostomy. [facility] was later [facility]. Later that day had an accidental trach dislodgement. His wob continued to increase requiring re-admission to [facility] on 10/31 and was intubated and later trached during hospital admission. Patient [facility].    On 08-Sep-2024 patient admitted to [facility] w/ drop in hgb and concern for GIB. GI consulted for possible endoscopy. TRH to admit and PCCM to consult for vent management.  After admission for GIB Joshua Eaton was evaluated by GI and found to have ulceration on the gastric mucosa near his PEG which was negative for malignancy on biopsy. He had AC held at this time. Unfortunately during this process he began having tracheal bleeding and tracheostomy clotting issues. Bronchoscospy revealed this was due to tracheal positioning with the distal end of the tube causing ulceration on the posterior membrane. Multiple trachs were tried including Shiley regular and distal XLT and bivona trachs. Due to tracheal clotting leading to a cardiac arrest on 1/2> during resuscitation his trach was decannulated and he was reintubated orally. Subsequently he was trached again with a bivaona that was adjusted during multiple bronchoscopies. Due to ongoing bleeding issues he unforunately still required significant sedation for vent synchrony, and at times NMB. ENT was helpful with offering suggestions for positioning to help combat this difficult problem, which were enacted.  Due  to progressive hypervolemia and renal failure, he was initiate on CRRT with good response.  During his admission he was treated for pneumonia with antibiotics directed based on cultures.    Due to inability to significantly reduce sedation safely for several weeks due to ongoing tracheostomy bleeding issues, a brain MRI was obtained after discussion with family to ensure that he did not have a severe neurologic injury following his cardiac arrest. Unfortunately his brain MRI demonstrated innumerable microhemorrhages, progressed significantly since MRI in 06/2023. Neurology evaluated him and recommended transition to comfort care based on very poor neurologic prognosis, especially in light of multiorgan failure. Family elected for transition to comfort-focused care on 09/22/23 and he passed away with family at bedside.   Pertinent Labs and Studies  Significant Diagnostic Studies MR BRAIN WO CONTRAST Result Date: 09-22-23 CLINICAL DATA:  Encephalopathy status post cardiac arrest EXAM: MRI HEAD WITHOUT CONTRAST TECHNIQUE: Multiplanar, multiecho pulse sequences of the brain and surrounding structures were obtained without intravenous contrast. COMPARISON:  06/21/2023 FINDINGS: Brain: No acute infarct, mass effect or extra-axial collection. There are innumerable chronic microhemorrhages in a mixed central and peripheral distribution. This has markedly worsened since 06/21/2023. There is multifocal hyperintense T2-weighted signal within the white matter. Parenchymal volume and CSF spaces are normal. The midline structures are normal. Vascular: Normal flow voids. Skull and upper cervical spine: Normal calvarium and skull base. Visualized upper cervical spine and soft tissues are normal. Sinuses/Orbits:Bilateral mastoid effusions. Paranasal sinuses are clear. Normal orbits. IMPRESSION: 1. No acute intracranial abnormality. 2. Innumerable chronic microhemorrhages in a mixed central and peripheral distribution,  markedly worsened since 06/21/2023. This may be due to hypertension, amyloid angiopathy, coagulopathy or other vascular insult. Electronically Signed   By: Deatra Robinson M.D.   On: 2023/09/22 01:05   DG Chest Port 1 View Result Date: 09/13/2023 CLINICAL DATA:  [id number].  History of ETT, airspace disease. EXAM: PORTABLE CHEST 1 VIEW COMPARISON:  Portable chest yesterday at 5:21 a.m. FINDINGS: 5:02 a.m. diffuse heterogeneous bilateral airspace disease continues to be seen, with a somewhat basal gradient and overlying small pleural effusions. The opacities are slightly less dense in the upper lung fields today but are otherwise not significantly changed. Stable cardiac size with central vascular prominence. Stable mediastinum. Right IJ central line terminates in the distal SVC, tracheostomy cannula tip in between the heads of the clavicles. All are unchanged. IMPRESSION: 1. Diffuse heterogeneous bilateral airspace disease with a somewhat basal gradient and overlying small pleural effusions. The opacities are slightly less dense in the upper lung fields today but are otherwise not significantly changed. 2. Stable cardiac size with central vascular prominence. 3. Stable support apparatus. Electronically Signed   By: Almira Bar M.D.   On: 09/13/2023 06:00   DG Chest Port 1 View Result Date: 09/12/2023 CLINICAL DATA:  Short of breath, respiratory failure EXAM: PORTABLE CHEST 1 VIEW COMPARISON:  None Available. FINDINGS: Tracheostomy 2 and central venous line unchanged. Stable cardiac silhouette. Bilateral severe patchy airspace disease. Low lung volumes. No pneumothorax. No interval change IMPRESSION: 1. No interval change. 2. Severe bilateral airspace disease. Electronically Signed   By: Genevive Bi M.D.   On: 09/12/2023 08:51   VAS [place] LOWER EXTREMITY VENOUS (DVT) Result Date: 09/12/2023  Lower Venous DVT Study Patient Name:  FILEMON GIMLIN  Date of Exam:   09/11/2023 Medical Rec #: [id number]   Accession #:     [id number] Date of Birth: 09/29/1955    Patient Gender: M Patient Age:  67 years Exam Location:  [facility] Procedure:      VAS [place] LOWER EXTREMITY VENOUS (DVT) Referring Phys: Levon Hedger --------------------------------------------------------------------------------  Indications: Edema.  Risk Factors: Patient on CRRT, ventilation. Limitations: Anasarca, unna boots. Comparison Study: Prior negative bilateral LEV done 06/16/23 Performing Technologist: Sherren Kerns RVS  Examination Guidelines: A complete evaluation includes B-mode imaging, spectral Doppler, color Doppler, and power Doppler as needed of all accessible portions of each vessel. Bilateral testing is considered an integral part of a complete examination. Limited examinations for reoccurring indications may be performed as noted. The reflux portion of the exam is performed with the patient in reverse Trendelenburg.  +---------+---------------+---------+-----------+---------------+--------------+ RIGHT    CompressibilityPhasicitySpontaneityProperties     Thrombus Aging +---------+---------------+---------+-----------+---------------+--------------+ CFV      Full           Yes      No         pulsatile                                                                 waveform                      +---------+---------------+---------+-----------+---------------+--------------+ SFJ      Full                                                             +---------+---------------+---------+-----------+---------------+--------------+ FV Prox  Full                                                             +---------+---------------+---------+-----------+---------------+--------------+ FV Mid   Full                                                             +---------+---------------+---------+-----------+---------------+--------------+ FV DistalFull           Yes      No         pulsatile                                                                  waveform                      +---------+---------------+---------+-----------+---------------+--------------+ PFV      Full                                                             +---------+---------------+---------+-----------+---------------+--------------+  POP      Full           Yes      No         pulsatile                                                                 waveform                      +---------+---------------+---------+-----------+---------------+--------------+ PERO                                                       Not visualized                                                            secondary to                                                              unna boots     +---------+---------------+---------+-----------+---------------+--------------+ Soleal                                                     Not visualized                                                            secondary to                                                              unna boots     +---------+---------------+---------+-----------+---------------+--------------+ Gastroc  Full           Yes      No         pulsatile                                                                 waveform                      +---------+---------------+---------+-----------+---------------+--------------+   +---------+---------------+---------+-----------+---------------+--------------+  LEFT     CompressibilityPhasicitySpontaneityProperties     Thrombus Aging +---------+---------------+---------+-----------+---------------+--------------+ CFV      Full           Yes      No         pulsatile                                                                 waveform                      +---------+---------------+---------+-----------+---------------+--------------+  SFJ      Full                                                             +---------+---------------+---------+-----------+---------------+--------------+ FV Prox  Full           Yes      No         pulsatile                                                                 waveform                      +---------+---------------+---------+-----------+---------------+--------------+ FV Mid   Full                                                             +---------+---------------+---------+-----------+---------------+--------------+ FV DistalFull           Yes      No         pulsatile                                                                 waveform                      +---------+---------------+---------+-----------+---------------+--------------+ PFV      Full           Yes      No         pulsatile                                                                 waveform                      +---------+---------------+---------+-----------+---------------+--------------+  POP      Full           Yes      No         pulsatile                                                                 waveform                      +---------+---------------+---------+-----------+---------------+--------------+ PTV                                                        Not visualized                                                            secondary to                                                              unna boots     +---------+---------------+---------+-----------+---------------+--------------+ PERO                                                       Not visualized                                                            secondary to                                                              unna boots     +---------+---------------+---------+-----------+---------------+--------------+  Gastroc  Full           Yes      No         pulsatile                                                                 waveform                      +---------+---------------+---------+-----------+---------------+--------------+  Summary: RIGHT: - There is no evidence of deep vein thrombosis in the lower extremity. However, portions of this examination were limited- see technologist comments above.  - No cystic structure found in the popliteal fossa. Pulsatile waveforms suggestive of fluid overload noted throughout  LEFT: - There is no evidence of deep vein thrombosis in the lower extremity. However, portions of this examination were limited- see technologist comments above.  - No cystic structure found in the popliteal fossa. Pulsatile waveforms suggestive of fluid overload noted throughout.  *See table(s) above for measurements and observations. Electronically signed by Lemar Livings MD on 09/12/2023 at 7:26:09 AM.    Final    DG Chest Port 1 View Result Date: 09/10/2023 CLINICAL DATA:  Respiratory failure. EXAM: PORTABLE CHEST 1 VIEW COMPARISON:  Chest radiograph dated 09/08/2023. FINDINGS: Truck ostomy and right IJ central venous line in similar position. No significant interval change in bilateral pulmonary opacities. Similar bilateral pleural effusions. Stable cardiac silhouette. No acute osseous pathology. IMPRESSION: No significant interval change. Electronically Signed   By: Elgie Collard M.D.   On: 09/10/2023 13:05   US RENAL Result Date: 09/09/2023 CLINICAL DATA:  Acute kidney injury. EXAM: RENAL / URINARY TRACT ULTRASOUND COMPLETE COMPARISON:  CT scan abdomen and pelvis from 09/08/2023. FINDINGS: The technologist noted technically limited exam due to immobility/ventilator. Right Kidney: Renal measurements: 5.3 x 5.6 x 13.8 cm. = volume: 214.2 mL. Renal cortical echogenicity is within normal limits. No hydronephrosis. There is a single simple cyst in the interpolar region. Left  Kidney: Renal measurements: 5.7 x 7.9 x 11.9 cm. = volume: 280.3 mL. Renal cortical echogenicity is within normal limits. No hydronephrosis. There are at least 2 simple cysts. Bladder: Decompressed by Foley catheter and not well evaluated on this exam. Other: Mild perihepatic ascites noted. IMPRESSION: *Bilateral simple renal cysts. *Mild perihepatic ascites noted. *Otherwise grossly unremarkable exam. Electronically Signed   By: Jules Schick M.D.   On: 09/09/2023 11:10   DG Chest Port 1 View Result Date: 09/08/2023 CLINICAL DATA:  Tracheostomy in place. EXAM: PORTABLE CHEST 1 VIEW COMPARISON:  Chest radiograph dated 09/05/2023. FINDINGS: Tracheostomy with tip approximately 3.5 cm above the carina. Right IJ central venous line with tip over central SVC. Bilateral confluent pulmonary opacities and bibasilar consolidative changes. Overall progression of confluent opacity since the prior radiograph. Small bilateral pleural effusions suspected. No pneumothorax. Stable cardiac silhouette. No acute osseous pathology. IMPRESSION: 1. Tracheostomy with tip approximately 3.5 cm above the carina. 2. Progression of bilateral confluent pulmonary opacities and bibasilar consolidative changes. Electronically Signed   By: Elgie Collard M.D.   On: 09/08/2023 17:29   CT ABDOMEN PELVIS WO CONTRAST Result Date: 09/08/2023 CLINICAL DATA:  Retroperitoneal bleed suspected, weakness, abnormal labs EXAM: CT ABDOMEN AND PELVIS WITHOUT CONTRAST TECHNIQUE: Multidetector CT imaging of the abdomen and pelvis was performed following the standard protocol without IV contrast. RADIATION DOSE REDUCTION: This exam was performed according to the departmental dose-optimization program which includes automated exposure control, adjustment of the mA and/or kV according to patient size and/or use of iterative reconstruction technique. COMPARISON:  CT 08/12/2023 FINDINGS: Lower chest: Diffuse airspace opacities in the lower lungs. Small bilateral  pleural effusions. Hepatobiliary: Unremarkable noncontrast appearance of the liver. Cholecystectomy. No biliary dilation. Pancreas: Unremarkable. Spleen: Unremarkable. Adrenals/Urinary Tract: Stable adrenal glands. No urinary calculi or hydronephrosis. Decompressed bladder about the Foley catheter with locules of gas in the bladder lumen. Mild perivesical fat stranding. Stomach/Bowel: Rectal tube. Normal caliber large and small bowel. Normal caliber  large and small bowel. Colonic diverticulosis without diverticulitis. Mildly hyperdense liquid stool in the colon. This is nonspecific but hemorrhage is not excluded. Correlation with hemoccult is recommended. Gastrostomy tube. Distended stomach. Postoperative change of partial colectomy with ileocolonic anastomosis in the right upper quadrant. Vascular/Lymphatic: Aortic atherosclerosis. No enlarged abdominal or pelvic lymph nodes. Reproductive: Unremarkable. Other: Body wall anasarca. Marked scrotal edema. No free intraperitoneal air. No retroperitoneal hematoma. Musculoskeletal: No acute fracture. IMPRESSION: 1. No retroperitoneal hematoma. 2. Mildly hyperdense liquid stool in the colon. This is nonspecific but blood products are not excluded. Correlation with hemoccult is recommended. 3. Multifocal pneumonia, edema, and/or aspiration in the lower lungs with small bilateral pleural effusions. 4. Decompressed bladder about a Foley catheter. Perivesical stranding can be seen with cystitis. 5. Body wall anasarca. Marked scrotal edema. Aortic Atherosclerosis (ICD10-I70.0). Electronically Signed   By: Minerva Fester M.D.   On: 09/08/2023 02:26   DG CHEST PORT 1 VIEW Result Date: 09/05/2023 CLINICAL DATA:  68 year old male status post intubation. EXAM: PORTABLE CHEST 1 VIEW COMPARISON:  Chest x-ray 09/04/2023. FINDINGS: An endotracheal tube is in place with tip 2.1 cm above the carina. There is a right-sided internal jugular central venous catheter with tip terminating in  the distal superior vena cava. Lung volumes are low. Diffuse interstitial prominence and patchy ill-defined opacities throughout the lungs bilaterally. Moderate bilateral pleural effusions. Bibasilar opacities which may reflect areas of atelectasis and/or consolidation. Heart size appears enlarged. No definite pneumothorax. IMPRESSION: 1. Support apparatus, as above. 2. Overall, the appearance of the chest is very similar to the prior study. Based on comparison with prior chest CT, findings are favored to reflect multilobar pneumonia, although some degree of underlying congestive heart failure may also be present. Electronically Signed   By: Trudie Reed M.D.   On: 09/05/2023 07:43   [place] EKG SITE RITE Result Date: 09/04/2023 If Site Rite image not attached, placement could not be confirmed due to current cardiac rhythm.  DG Chest Port 1 View Result Date: 09/04/2023 CLINICAL DATA:  68 year old male with history of respiratory failure. EXAM: PORTABLE CHEST 1 VIEW COMPARISON:  Chest x-ray 09/02/2023. FINDINGS: There is a right-sided internal jugular central venous catheter with tip terminating in the distal superior vena cava. An endotracheal tube is in place with tip 2.0 cm above the carina. Lung volumes are low. Moderate bilateral pleural effusions. Bibasilar opacities which may reflect areas of atelectasis and/or consolidation. Diffuse interstitial prominence and patchy multifocal ill-defined airspace disease scattered throughout the lungs bilaterally. No definite pneumothorax. Pulmonary vasculature is completely obscured. Heart size appears enlarged. The patient is rotated to the right on today's exam, resulting in distortion of the mediastinal contours and reduced diagnostic sensitivity and specificity for mediastinal pathology. IMPRESSION: 1. Support apparatus, as above. 2. Unchanged radiographic appearance of the chest, which likely reflect severe multilobar bilateral pneumonia with moderate bilateral  pleural effusions. Some degree of congestive heart failure is not excluded. Electronically Signed   By: Trudie Reed M.D.   On: 09/04/2023 08:01   VAS [place] UPPER EXTREMITY VENOUS DUPLEX Result Date: 09/03/2023 UPPER VENOUS STUDY  Patient Name:  JASYAH SPOONEMORE  Date of Exam:   09/03/2023 Medical Rec #: [id number]   Accession #:    [id number] Date of Birth: [birth year]    Patient Gender: M Patient Age:   47 years Exam Location:  [facility] Procedure:      VAS [place] UPPER EXTREMITY VENOUS DUPLEX Referring Phys: Zenia Resides --------------------------------------------------------------------------------  Indications: Swelling Risk Factors:  Immobility Cancer . Surgery Esophagogastroduodenoscopy 08/15/23. Limitations: Limited range of motion and bandages. Comparison Study: No significant changes seen since previous exam on left arm                   08/14/23. Significant changes seen since previous exam on                   right arm 08/23/23. Performing Technologist: Shona Simpson  Examination Guidelines: A complete evaluation includes B-mode imaging, spectral Doppler, color Doppler, and power Doppler as needed of all accessible portions of each vessel. Bilateral testing is considered an integral part of a complete examination. Limited examinations for reoccurring indications may be performed as noted.  Right Findings: +----------+------------+---------+-----------+-----------------+--------------+ RIGHT     CompressiblePhasicitySpontaneous   Properties       Summary     +----------+------------+---------+-----------+-----------------+--------------+ IJV                                                        Not visualized +----------+------------+---------+-----------+-----------------+--------------+ Subclavian    Full       Yes       Yes                                    +----------+------------+---------+-----------+-----------------+--------------+ Axillary      Full       Yes       Yes                                     +----------+------------+---------+-----------+-----------------+--------------+ Brachial      Full       Yes       Yes                                    +----------+------------+---------+-----------+-----------------+--------------+ Radial        Full       Yes       Yes                                    +----------+------------+---------+-----------+-----------------+--------------+ Ulnar         Full       Yes       Yes                                    +----------+------------+---------+-----------+-----------------+--------------+ Cephalic    Partial      No        Yes        brightly         Acute                                                    echogenic                   +----------+------------+---------+-----------+-----------------+--------------+  Basilic       Full                 Yes                                    +----------+------------+---------+-----------+-----------------+--------------+ Acute, non-occlusive, SVT seen in the CephV throughout the forearm.  Left Findings: +----------+------------+---------+-----------+-------------------+-------+ LEFT      CompressiblePhasicitySpontaneous    Properties     Summary +----------+------------+---------+-----------+-------------------+-------+ IJV           Full       Yes       Yes                               +----------+------------+---------+-----------+-------------------+-------+ Subclavian    Full       Yes       Yes                               +----------+------------+---------+-----------+-------------------+-------+ Axillary      Full       Yes       Yes                               +----------+------------+---------+-----------+-------------------+-------+ Brachial      Full       Yes       Yes                               +----------+------------+---------+-----------+-------------------+-------+ Radial        Full        Yes       Yes                               +----------+------------+---------+-----------+-------------------+-------+ Ulnar         Full       Yes       Yes                               +----------+------------+---------+-----------+-------------------+-------+ Cephalic      Full       Yes       Yes    rigid w/compression Acute  +----------+------------+---------+-----------+-------------------+-------+ Basilic       Full                 Yes                               +----------+------------+---------+-----------+-------------------+-------+ Acute, non-occlusive, SVT seen in the CephV at the [facility].  Summary:  Right: No evidence of deep vein thrombosis in the upper extremity. Findings consistent with acute superficial vein thrombosis involving the right cephalic vein.  Left: No evidence of deep vein thrombosis in the upper extremity. Findings consistent with acute superficial vein thrombosis involving the left cephalic vein.  *See table(s) above for measurements and observations.  Diagnosing physician: Carolynn Sayers Electronically signed by Carolynn Sayers on 09/03/2023 at 3:58:38 PM.    Final    DG Chest Port 1 View Result Date: 09/02/2023 CLINICAL DATA:  Pneumonia.  EXAM: PORTABLE CHEST 1 VIEW COMPARISON:  Same day. FINDINGS: Stable cardiomediastinal silhouette. Stable central pulmonary vascular congestion and bilateral lung opacities concerning for edema and pleural effusions. Right internal jugular catheter is unchanged. Interval placement of endotracheal tube with distal tip approximately 1 cm above the carina. Tracheostomy tube has been removed. IMPRESSION: Endotracheal tube is noted with distal tip approximately 1 cm above the carina. Stable central pulmonary vascular congestion and probable bilateral pulmonary edema and effusions. Electronically Signed   By: Lupita Raider M.D.   On: 09/02/2023 15:03   DG Chest Port 1 View Result Date: 09/02/2023 CLINICAL DATA:  Acute  respiratory failure. EXAM: PORTABLE CHEST 1 VIEW COMPARISON:  Radiographs 09/01/2023 and 08/31/2023.  CT 08/31/2023. FINDINGS: 0416 hours. Tracheostomy and right IJ central venous catheter are unchanged in position. Unchanged cardiomegaly, bilateral pleural effusions and diffuse bilateral pulmonary airspace opacities. No evidence of pneumothorax or acute osseous abnormality. Telemetry leads overlie the chest. IMPRESSION: Unchanged cardiomegaly, bilateral pleural effusions and diffuse bilateral pulmonary airspace opacities. Electronically Signed   By: Carey Bullocks M.D.   On: 09/02/2023 09:00   DG CHEST PORT 1 VIEW Result Date: 09/01/2023 CLINICAL DATA:  Tracheostomy tube placement. EXAM: PORTABLE CHEST 1 VIEW COMPARISON:  08/31/2023 FINDINGS: Due to patient positioning, the distal tip of the tracheostomy tube cannot be determined. Based on yesterday's x-ray, the more cranial tip is presumably the intraluminal component which would place it about 3.3 cm above the base of the carina. Right IJ central line remains in place with tip overlying the SVC/RA junction. The cardio pericardial silhouette is enlarged. Low lung volumes with diffuse airspace disease is similar to prior. Telemetry leads overlie the chest. IMPRESSION: 1. Due to patient positioning and superimposition of the tracheostomy tube, the distal tip of the tracheostomy tube cannot be determined. Based on yesterday's x-ray, the more cranial tip is presumably the intraluminal component which would place it about 3.3 cm above the base of the carina. Repeat chest x-ray with mild rotation could be used to confirm tip placement. 2. Low lung volumes with diffuse airspace disease, similar to prior. Electronically Signed   By: Kennith Center M.D.   On: 09/01/2023 07:17   ECHOCARDIOGRAM COMPLETE Result Date: 08/31/2023    ECHOCARDIOGRAM REPORT   Patient Name:   CORBIN KRAMPITZ Date of Exam: 08/31/2023 Medical Rec #:  [id number]  Height:       73.0 in Accession #:     [id number] Weight:       215.2 lb Date of Birth:  1956/03/23   BSA:          2.220 m Patient Age:    67 years   BP:           88/52 mmHg Patient Gender: M          HR:           79 bpm. Exam Location:  Inpatient Procedure: 2D Echo, Cardiac Doppler and Color Doppler Indications:    Abnormal ECG  History:        Patient has prior history of Echocardiogram examinations, most                 recent 06/04/2023. Risk Factors:Hypertension.  Sonographer:    Vern Claude Referring Phys: [id number] ELIZABETH G MATHEWS IMPRESSIONS  1. Left ventricular ejection fraction, by estimation, is 60 to 65%. The left ventricle has normal function. The left ventricle has no regional wall motion abnormalities. Left ventricular diastolic parameters were normal.  2.  Right ventricular systolic function is normal. The right ventricular size is normal.  3. There is severe eccentric posteriorly directed mitral regurgitation with Coanda effect likely secondary to severe prolapse or perforation of the anterior mitral leaflet.  4. The aortic valve is normal in structure. Aortic valve regurgitation is mild. No aortic stenosis is present.  5. The inferior vena cava is normal in size with greater than 50% respiratory variability, suggesting right atrial pressure of 3 mmHg. FINDINGS  Left Ventricle: Left ventricular ejection fraction, by estimation, is 60 to 65%. The left ventricle has normal function. The left ventricle has no regional wall motion abnormalities. The left ventricular internal cavity size was normal in size. There is  no left ventricular hypertrophy. Left ventricular diastolic parameters were normal. Right Ventricle: The right ventricular size is normal. No increase in right ventricular wall thickness. Right ventricular systolic function is normal. Left Atrium: Left atrial size was normal in size. Right Atrium: Right atrial size was normal in size. Pericardium: There is no evidence of pericardial effusion. Mitral Valve: The mitral valve is  degenerative in appearance. Moderate to severe mitral valve regurgitation. No evidence of mitral valve stenosis. Tricuspid Valve: The tricuspid valve is not well visualized. Tricuspid valve regurgitation is trivial. No evidence of tricuspid stenosis. Aortic Valve: The aortic valve is normal in structure. Aortic valve regurgitation is mild. No aortic stenosis is present. Aortic valve mean gradient measures 7.0 mmHg. Aortic valve peak gradient measures 14.7 mmHg. Aortic valve area, by VTI measures 3.27  cm. Pulmonic Valve: The pulmonic valve was not well visualized. Pulmonic valve regurgitation is not visualized. No evidence of pulmonic stenosis. Aorta: The aortic root is normal in size and structure. Venous: The inferior vena cava is normal in size with greater than 50% respiratory variability, suggesting right atrial pressure of 3 mmHg. IAS/Shunts: No atrial level shunt detected by color flow Doppler.  LEFT VENTRICLE PLAX 2D LVIDd:         5.00 cm      Diastology LVIDs:         3.60 cm      LV e' medial:    13.60 cm/s LV PW:         1.10 cm      LV E/e' medial:  12.5 LV IVS:        1.00 cm      LV e' lateral:   13.60 cm/s LVOT diam:     2.10 cm      LV E/e' lateral: 12.5 LV SV:         116 LV SV Index:   52 LVOT Area:     3.46 cm  LV Volumes (MOD) LV vol d, MOD A2C: 292.0 ml LV vol d, MOD A4C: 242.0 ml LV vol s, MOD A2C: 79.6 ml LV vol s, MOD A4C: 107.0 ml LV SV MOD A2C:     212.4 ml LV SV MOD A4C:     242.0 ml LV SV MOD BP:      165.9 ml RIGHT VENTRICLE RV Basal diam:  3.40 cm RV Mid diam:    2.70 cm RV S prime:     19.70 cm/s TAPSE (M-mode): 3.2 cm LEFT ATRIUM             Index        RIGHT ATRIUM           Index LA Vol (A2C):   94.2 ml 42.44 ml/m  RA Area:     13.80  cm LA Vol (A4C):   84.3 ml 37.98 ml/m  RA Volume:   26.70 ml  12.03 ml/m LA Biplane Vol: 95.2 ml 42.89 ml/m  AORTIC VALVE                     PULMONIC VALVE AV Area (Vmax):    2.94 cm      PV Vmax:       0.75 m/s AV Area (Vmean):   2.77 cm       PV Peak grad:  2.2 mmHg AV Area (VTI):     3.27 cm AV Vmax:           192.00 cm/s AV Vmean:          122.000 cm/s AV VTI:            0.356 m AV Peak Grad:      14.7 mmHg AV Mean Grad:      7.0 mmHg LVOT Vmax:         163.00 cm/s LVOT Vmean:        97.700 cm/s LVOT VTI:          0.336 m LVOT/AV VTI ratio: 0.94  AORTA Ao Root diam: 3.50 cm Ao Asc diam:  2.90 cm MITRAL VALVE MV Area (PHT): 2.92 cm     SHUNTS MV Decel Time: 260 msec     Systemic VTI:  0.34 m MR Peak grad: 55.1 mmHg     Systemic Diam: 2.10 cm MR Mean grad: 32.0 mmHg MR Vmax:      371.00 cm/s MR Vmean:     265.0 cm/s MV E velocity: 170.00 cm/s MV A velocity: 70.60 cm/s MV E/A ratio:  2.41 Aditya Sabharwal Electronically signed by Dorthula Nettles Signature Date/Time: 08/31/2023/4:23:30 PM    Final    CT Angio Chest Pulmonary Embolism (PE) W or WO Contrast Result Date: 08/31/2023 CLINICAL DATA:  Rule out pulmonary embolism.  History of DVT. EXAM: CT ANGIOGRAPHY CHEST WITH CONTRAST TECHNIQUE: Multidetector CT imaging of the chest was performed using the standard protocol during bolus administration of intravenous contrast. Multiplanar CT image reconstructions and MIPs were obtained to evaluate the vascular anatomy. RADIATION DOSE REDUCTION: This exam was performed according to the departmental dose-optimization program which includes automated exposure control, adjustment of the mA and/or kV according to patient size and/or use of iterative reconstruction technique. CONTRAST:  75mL OMNIPAQUE IOHEXOL 350 MG/ML SOLN COMPARISON:  08/13/2023 FINDINGS: Cardiovascular: Exam detail is diminished due to respiratory motion artifact. The pulmonary arterial opacification is also suboptimal further limiting exam. No signs of acute pulmonary embolus to the level of the proximal segmental pulmonary arteries. Beyond this level, study is diminished due to motion artifact as above. Aortic atherosclerosis. Coronary artery calcifications. No pericardial effusion.  Mediastinum/Nodes: Tracheostomy tube tip terminates approximately 4.5 cm above the carina and is directed towards the posterior wall of the trachea, image 85/9. Thyroid gland and esophagus are unremarkable. Prominent mediastinal and hilar lymph nodes are identified bilaterally. For example, right hilar node measures 1.5 cm, image 62/5. Subcarinal lymph node measures 1.4 cm, image 60/5. Lungs/Pleura: There is a small scratch set small bilateral pleural effusions noted, left greater than right. Dense consolidative change identified within the right middle lobe and both lower lobes with air bronchograms throughout. Patchy airspace densities are scattered throughout the upper lung zones. Diffuse ground-glass attenuation is noted within the non consolidated portions of the lungs with scattered areas of air trapping. Increase interstitial markings are noted which  may reflect superimposed edema. Upper Abdomen: No acute abnormality. There is a gastrostomy tube in place with retention balloon in the stomach. There is moderate fluid distension of the gastric lumen. Musculoskeletal: Mild diffuse body wall edema suggesting anasarca. No acute or suspicious osseous findings. Review of the MIP images confirms the above findings. IMPRESSION: 1. Exam detail is diminished due to respiratory motion artifact as well as suboptimal pulmonary artery her opacifications. No signs of acute pulmonary embolus to the level of the proximal segmental pulmonary arteries. Beyond this level, exam detail is diminished due to motion artifact and suboptimal pulmonary arterial opacification. 2. Dense consolidative change within the right middle lobe and both lower lobes with air bronchograms throughout. Patchy airspace densities are scattered throughout the upper lung zones. Findings are compatible with multifocal pneumonia and/or aspiration. The. 3. Diffuse ground-glass attenuation within the non consolidated portions of the lungs with scattered areas  of air trapping. Findings are compatible with pulmonary edema. 4. Small bilateral pleural effusions, left greater than right. 5. Prominent mediastinal and hilar lymph nodes are identified bilaterally, likely reactive. 6. Mild diffuse body wall edema suggesting anasarca. 7. Coronary artery calcifications. 8.  Aortic Atherosclerosis (ICD10-I70.0). Electronically Signed   By: Signa Kell M.D.   On: 08/31/2023 14:01   DG CHEST PORT 1 VIEW Result Date: 08/31/2023 CLINICAL DATA:  Acute respiratory failure. EXAM: PORTABLE CHEST 1 VIEW COMPARISON:  August 30, 2023. FINDINGS: Stable cardiomediastinal silhouette. Tracheostomy tube and right internal jugular catheter are unchanged. Able bilateral diffuse lung opacities are noted concerning for edema or pneumonia. Bilateral pleural effusions are noted. Bony thorax is unremarkable. IMPRESSION: Stable support apparatus. Stable bilateral lung opacities and effusions. Electronically Signed   By: Lupita Raider M.D.   On: 08/31/2023 10:00   DG CHEST PORT 1 VIEW Result Date: 08/30/2023 CLINICAL DATA:  Acute respiratory failure. EXAM: PORTABLE CHEST 1 VIEW COMPARISON:  Chest x-ray from yesterday. FINDINGS: Unchanged tracheostomy tube and right internal jugular central venous catheter. Stable cardiomediastinal silhouette. Diffuse airspace opacities throughout both lungs are not significantly changed. No pneumothorax or large pleural effusion. No acute osseous abnormality. IMPRESSION: 1. Unchanged diffuse bilateral airspace disease. Electronically Signed   By: Obie Dredge M.D.   On: 08/30/2023 13:50   DG Chest 1 View Result Date: 08/29/2023 CLINICAL DATA:  Cough EXAM: PORTABLE CHEST 1 VIEW COMPARISON:  08/23/2023 FINDINGS: Cardiac shadow remains enlarged. Tracheostomy tube is again seen in satisfactory position. Right jugular central line is noted. Increase in patchy airspace opacity is noted bilaterally when compare with the prior exam no sizable effusion is seen.  No bony abnormality is noted. IMPRESSION: Increase in bilateral airspace opacities bilaterally. Electronically Signed   By: Alcide Clever M.D.   On: 08/29/2023 16:59   VAS [place] UPPER EXTREMITY VENOUS DUPLEX Result Date: 08/23/2023 UPPER VENOUS STUDY  Patient Name:  RAMAJ HEMPHILL  Date of Exam:   08/23/2023 Medical Rec #: [id number]   Accession #:    [id number] Date of Birth: 1956/08/15    Patient Gender: M Patient Age:   70 years Exam Location:  [facility] Procedure:      VAS [place] UPPER EXTREMITY VENOUS DUPLEX Referring Phys: Lewie Chamber --------------------------------------------------------------------------------  Indications: Edema Risk Factors: None identified. Comparison Study: 06/16/2023 - Right:                   Findings consistent with acute deep vein thrombosis involving  the right                   axillary vein. Findings consistent with acute superficial vein                   thrombosis involving                   the right cephalic vein.                    Left:                   Findings consistent with acute superficial vein thrombosis                   involving the                   left cephalic vein. Performing Technologist: Chanda Busing RVT  Examination Guidelines: A complete evaluation includes B-mode imaging, spectral Doppler, color Doppler, and power Doppler as needed of all accessible portions of each vessel. Bilateral testing is considered an integral part of a complete examination. Limited examinations for reoccurring indications may be performed as noted.  Right Findings: +----------+------------+---------+-----------+----------+-------+ RIGHT     CompressiblePhasicitySpontaneousPropertiesSummary +----------+------------+---------+-----------+----------+-------+ IJV           Full       Yes       Yes                      +----------+------------+---------+-----------+----------+-------+ Subclavian    Full       Yes       Yes                       +----------+------------+---------+-----------+----------+-------+ Axillary      Full       Yes       Yes                      +----------+------------+---------+-----------+----------+-------+ Brachial      Full                                          +----------+------------+---------+-----------+----------+-------+ Radial        Full                                          +----------+------------+---------+-----------+----------+-------+ Ulnar         Full                                          +----------+------------+---------+-----------+----------+-------+ Cephalic      Full                                          +----------+------------+---------+-----------+----------+-------+ Basilic       Full                                          +----------+------------+---------+-----------+----------+-------+  Left Findings: +----------+------------+---------+-----------+----------+-------+ LEFT      CompressiblePhasicitySpontaneousPropertiesSummary +----------+------------+---------+-----------+----------+-------+ Subclavian    Full       Yes       Yes                      +----------+------------+---------+-----------+----------+-------+  Summary:  Right: No evidence of deep vein thrombosis in the upper extremity. No evidence of superficial vein thrombosis in the upper extremity.  Left: No evidence of thrombosis in the subclavian.  *See table(s) above for measurements and observations.  Diagnosing physician: Heath Lark Electronically signed by Heath Lark on 08/23/2023 at 7:54:12 PM.    Final    DG CHEST PORT 1 VIEW Result Date: 08/23/2023 CLINICAL DATA:  68 year old male with history of productive cough. EXAM: PORTABLE CHEST 1 VIEW COMPARISON:  Chest x-ray 08/16/2023. FINDINGS: A tracheostomy tube is in place with tip 4.7 cm above the carina. There is a right-sided internal jugular central venous catheter with tip terminating in the  superior cavoatrial junction. Lung volumes are low. Extensive bibasilar opacities which may reflect areas of atelectasis and/or consolidation. Small bilateral pleural effusions. No pneumothorax. Pulmonary vasculature is obscured. Heart size appears mildly enlarged. Upper mediastinal contours are within normal limits allowing for patient positioning. IMPRESSION: 1. Support apparatus, as above. 2. Extensive bibasilar areas of atelectasis and/or consolidation with superimposed small bilateral pleural effusions. Electronically Signed   By: Trudie Reed M.D.   On: 08/23/2023 05:47   US Abdomen Limited RUQ (LIVER/GB) Result Date: 08/17/2023 CLINICAL DATA:  Hyperbilirubinemia EXAM: ULTRASOUND ABDOMEN LIMITED RIGHT UPPER QUADRANT COMPARISON:  Ultrasound 06/10/2023. Multiple CT scans available as well FINDINGS: Gallbladder: Surgically absent Common bile duct: Diameter: 4 mm Liver: Echogenic hepatic parenchyma consistent with fatty liver infiltration. Portal vein is patent on color Doppler imaging with normal direction of blood flow towards the liver. Other: Some limitation with overlapping bowel gas and soft tissue. IMPRESSION: Previous cholecystectomy. No ductal dilatation. Fatty liver infiltration Electronically Signed   By: Karen Kays M.D.   On: 08/17/2023 12:05    Microbiology Recent Results (from the past 240 hours)  Aspergillus Ag, BAL/Serum     Status: None   Collection Time: 09/07/23  5:47 PM   Specimen: Sputum  Result Value Ref Range Status   Aspergillus Ag, BAL/Serum 0.03 0.00 - 0.49 Index Final    Comment: (NOTE) Performed At: [facility] [address] [place], [place] [id number] Jolene Schimke MD ON:[id number]   Culture, fungus without smear     Status: None (Preliminary result)   Collection Time: 09/11/23  4:55 PM   Specimen: Tracheal Aspirate; Other  Result Value Ref Range Status   Specimen Description TRACHEAL ASPIRATE  Final   Special Requests NONE  Final   Culture    Final    NO GROWTH 4 DAYS Performed at [facility], [address] [place], [place] [address]    Report Status PENDING  Incomplete  Culture, Respiratory w Gram Stain     Status: None   Collection Time: 09/12/23 11:06 AM   Specimen: Tracheal Aspirate; Respiratory  Result Value Ref Range Status   Specimen Description TRACHEAL ASPIRATE  Final   Special Requests NONE  Final   Gram Stain   Final    FEW WBC PRESENT, PREDOMINANTLY PMN NO ORGANISMS SEEN    Culture   Final    RARE Normal respiratory flora-no Staph aureus or Pseudomonas seen Performed at [facility] [place] [facility], [address], [place], [place] [address]  Report Status 09/14/2023 FINAL  Final  MRSA Next Gen by PCR, Nasal     Status: Abnormal   Collection Time: 09/13/23  2:23 PM   Specimen: Nasal Mucosa; Nasal Swab  Result Value Ref Range Status   MRSA by PCR Next Gen DETECTED (A) NOT DETECTED Final    Comment: RESULT CALLED TO, READ BACK BY AND VERIFIED WITH: RN Noreene Larsson [phone] @ 64 FH (NOTE) The GeneXpert MRSA Assay (FDA approved for NASAL specimens only), is one component of a comprehensive MRSA colonization surveillance program. It is not intended to diagnose MRSA infection nor to guide or monitor treatment for MRSA infections. Test performance is not FDA approved in patients less than 54 years old. Performed at [facility], [address] [place], [place] [address]     Lab Basic Metabolic Panel: Recent Labs  Lab 09/11/23 0405 09/11/23 1056 09/12/23 0411 09/12/23 1210 09/13/23 0326 09/13/23 0327 09/13/23 1745 09/14/23 0359 09/14/23 1615  0411  NA 141   < > 137   < > 135  --  135 137 136 136  K 4.6   < > 4.1   < > 4.1  --  4.3 4.5 4.6 5.0  CL 99   < > 99   < > 101  --  101 103 102 102  CO2 29   < > 27   < > 24  --  24 26 26 25   GLUCOSE 128*   < > 110*   < > 117*  --  133* 131* 138* 142*  BUN 74*   < > 44*   < > 29*  --  24* 21 23 36*  CREATININE 2.62*   < > 1.67*   < > 1.29*   --  1.32* 1.24 1.17 1.45*  CALCIUM 8.9   < > 8.7*   < > 8.5*  --  8.2* 8.5* 8.4* 8.5*  MG 2.8*  --  2.6*  --   --  2.5*  --  2.6*  --  2.5*  PHOS 5.1*   < > 2.3*   < > 2.4*  --  2.7 2.5 2.8 4.1   < > = values in this interval not displayed.   Liver Function Tests: Recent Labs  Lab 09/13/23 0326 09/13/23 1745 09/14/23 0359 09/14/23 1615  0411  ALBUMIN 2.2* 2.1* 2.2* 2.2* 2.2*   No results for input(s): "LIPASE", "AMYLASE" in the last 168 hours. No results for input(s): "AMMONIA" in the last 168 hours. CBC: Recent Labs  Lab 09/11/23 0405 09/11/23 1056 09/12/23 0411 09/12/23 1210 09/13/23 0327 09/14/23 0359  0411  WBC 6.6  --  11.2*  --  15.8* 19.2* 15.3*  HGB 7.0*   < > 7.8* 7.8* 8.2* 8.3* 8.1*  HCT 22.9*   < > 24.9* 23.0* 26.0* 25.7* 25.8*  MCV 97.4  --  94.7  --  93.5 92.8 94.2  PLT 59*  --  76*  --  83* 89* 74*   < > = values in this interval not displayed.   Cardiac Enzymes: No results for input(s): "CKTOTAL", "CKMB", "CKMBINDEX", "TROPONINI" in the last 168 hours. Sepsis Labs: Recent Labs  Lab 09/12/23 0411 09/13/23 0327 09/14/23 0359  0411  PROCALCITON 7.63  --   --   --   WBC 11.2* 15.8* 19.2* 15.3*    Procedures/Operations  08/14/23 CVC 08/15/23 EGD 12/16 bronch 12/23 fiberoptic tracheoscopy 08/30/23 bronchoscopy 09/01/23 bronchoscopy 09/01/23 bronchoscopy 09/08/2023 endotracheal intubation 09/07/2023 repeat tracheostomy 09/08/2023  bronchoscopy 09/10/23 HD catheter placement 09/12/2023 bronchoscopy 1/13 bronchoscopy 1/15 bronchsocopy     Steffanie Dunn 09/16/2023, 8:31 PM

## 2023-10-02 NOTE — Consult Note (Signed)
 NEUROLOGY CONSULT NOTE   Date of service: 10-12-2023 Patient Name: Joshua Eaton MRN:  [id number] DOB:  04/29/1956 Chief Complaint: "AMS, abnormal brain MRI" Requesting Provider: Joesph Mussel, DO  History of Present Illness  Joshua Eaton is a 68 y.o. male who has a past medical history of chronic respiratory failure with tracheostomy, vent dependent [facility] for evaluation of GI bleed. His other past medical history is significant for severe MR, moderate AI, paroxysmal A-fib on DOAC at the time of presentation, right upper extremity provoked DVT, moderate protein energy malnutrition, hypertension, severe deconditioning.  He was admitted with respiratory failure in October 2024 along with pneumonia that required tracheostomy and [facility].  He then had accidental dislodgment of his trach, increased work of breathing requiring readmission to [facility] where he was intubated and later trached during hospital admission and [facility]. He has since been admitted and per the chart review, has had issues with plugging of his trach, hemoptysis while on IV heparin  for DVT.  He then received TXA and anticoagulation was held.  He required chemical paralysis for trach repositioning and ventilation and then had antibiotic started for healthcare associated pneumonia.  His trach placement required multiple procedures and on 09/02/2023 he became bradycardic and then PEA arrest-had 1 round of CPR about 3 minutes and was intubated above.  Calls were made to outside systems for transfer, I am guessing upon family's request but were denied.  He has since required blood transfusions and CRRT. His brain MRI was repeated which showed excessive amount of chronic microhemorrhages in the mixed central and peripheral distribution that have markedly worsened since 06/21/2023-with cerebral amyloid  angiopathy versus coagulopathy versus vascular insult being in the differentials. Neurology was engaged because of the persistent altered mental status and opinion on his current neurological status   ROS  Comprehensive ROS performed and pertinent positives documented in HPI    Past History   Past Medical History:  Diagnosis Date   Anxiety    Cancer (HCC)    Depression    Hypertension    OCD (obsessive compulsive disorder)     Past Surgical History:  Procedure Laterality Date   BIOPSY  08/15/2023   Procedure: BIOPSY;  Surgeon: Ace Holder, MD;  Location: [facility] ENDOSCOPY;  Service: Gastroenterology;;   CHOLECYSTECTOMY     COLON SURGERY     cancerous polyp removed   ESOPHAGOGASTRODUODENOSCOPY (EGD) WITH PROPOFOL  N/A 08/15/2023   Procedure: ESOPHAGOGASTRODUODENOSCOPY (EGD) WITH PROPOFOL ;  Surgeon: Ace Holder, MD;  Location: [facility] ENDOSCOPY;  Service: Gastroenterology;  Laterality: N/A;   HERNIA REPAIR     IR GASTROSTOMY TUBE MOD SED  06/24/2023    Family History: History reviewed. No pertinent family history.  Social History  reports that he has never smoked. He has never been exposed to tobacco smoke. He has never used smokeless tobacco. He reports that he does not drink alcohol  and does not use drugs.  Allergies  Allergen Reactions   Codeine     "sends me into orbit"    Medications   Current Facility-Administered Medications:    0.9 %  sodium chloride  infusion (Manually program via Guardrails IV Fluids), , Intravenous, Once, Faith Homes, MD   0.9 %  sodium chloride  infusion (Manually program via Guardrails IV Fluids), , Intravenous, Once, Barbee Lew, MD   acetaminophen  (TYLENOL ) tablet 650 mg, 650 mg, Per  Tube, Q6H PRN, Valri Gee, RPH, 650 mg at 08/25/23 2008   albuterol  (PROVENTIL ) (2.5 MG/3ML) 0.083% nebulizer solution 2.5 mg, 2.5 mg, Nebulization, Q6H, Payne, John D, PA-C, 2.5 mg at Sep 22, 2023 0845   amiodarone  (PACERONE ) tablet  200 mg, 200 mg, Per Tube, Daily, Tu, Ching T, DO, 200 mg at 09-22-2023 1191   ascorbic acid  (VITAMIN C ) tablet 250 mg, 250 mg, Per Tube, Daily, Joesph Mussel, DO, 250 mg at September 22, 2023 4782   cefTRIAXone  (ROCEPHIN ) 2 g in sodium chloride  0.9 % 100 mL IVPB, 2 g, Intravenous, Q24H, Joesph Mussel, DO, Stopped at 09/14/23 1533   Chlorhexidine  Gluconate Cloth 2 % PADS 6 each, 6 each, Topical, Daily, Girguis, David, MD, 6 each at 09/14/23 1304   Darbepoetin Alfa  (ARANESP ) injection 40 mcg, 40 mcg, Subcutaneous, Q Sun-1800, Nan Aver, MD, 40 mcg at 09/12/23 2136   feeding supplement (JEVITY 1.5 CAL/FIBER) liquid 237 mL, 237 mL, Per Tube, 6 X Daily, Josiah Nigh, MD, 237 mL at 09-22-2023 0927   feeding supplement (PROSource TF20) liquid 60 mL, 60 mL, Per Tube, QID, Kasa, Kurian, MD, 60 mL at 09-22-23 9562   fentaNYL  (SUBLIMAZE ) 5000 mcg / 100 mL (50 mcg/mL) infusion, 25-300 mcg/hr, Intravenous, Continuous, Elmaghraby, Zaki, MD, Last Rate: 0.5 mL/hr at 09-22-2023 1000, 25 mcg/hr at 22-Sep-2023 1000   fentaNYL  (SUBLIMAZE ) bolus via infusion 25 mcg, 25 mcg, Intravenous, Q15 min PRN, Mannam, Praveen, MD, 25 mcg at 09/11/23 0154   fentaNYL  (SUBLIMAZE ) injection 100 mcg, 100 mcg, Intravenous, Q4H PRN, Icard, Bradley L, DO, 100 mcg at 09/12/23 0335   fiber supplement (BANATROL TF) liquid 60 mL, 60 mL, Per Tube, TID, Jaquita Merl P, DO, 60 mL at 2023/09/22 0927   guaiFENesin  (ROBITUSSIN) 100 MG/5ML liquid 15 mL, 15 mL, Per Tube, QID, Hardie Leyland B, NP, 15 mL at 09/22/23 0928   heparin  injection 1,000-6,000 Units, 1,000-6,000 Units, CRRT, PRN, Nan Aver, MD, 2,400 Units at September 22, 2023 1005   HYDROcodone  bit-homatropine (HYCODAN) 5-1.5 MG/5ML syrup 5 mL, 5 mL, Per Tube, Q4H PRN, Faith Homes, MD, 5 mL at 09/12/23 1000   insulin  aspart (novoLOG ) injection 0-15 Units, 0-15 Units, Subcutaneous, Q4H, Paliwal, Aditya, MD, 2 Units at 09-22-2023 0736   ipratropium-albuterol  (DUONEB) 0.5-2.5 (3) MG/3ML nebulizer solution 3  mL, 3 mL, Nebulization, Q4H PRN, Dannie Duval D, PA-C, 3 mL at 08/31/23 2046   leptospermum manuka honey (MEDIHONEY) paste 1 Application, 1 Application, Topical, Daily, Byrum, Robert S, MD, 1 Application at 22-Sep-2023 1308   lidocaine  2% "Nebulized" 2.5 mL, 2.5 mL, Nebulization, Q4H PRN, Bowser, Grace E, NP, 2.5 mL at 08/29/23 1348   lip balm (CARMEX) ointment, , Topical, PRN, Girguis, David, MD, Given at 08/22/23 2128   loperamide  HCl (IMODIUM ) 1 MG/7.5ML suspension 1 mg, 1 mg, Per Tube, BID, Jaquita Merl P, DO, 1 mg at 09-22-2023 6578   loperamide  HCl (IMODIUM ) 1 MG/7.5ML suspension 2 mg, 2 mg, Per Tube, BID PRN, Hardie Leyland B, NP, 2 mg at 09/12/23 2212   midazolam  (VERSED ) 100 mg/100 mL (1 mg/mL) premix infusion, 0-10 mg/hr, Intravenous, Continuous, Josiah Nigh, MD, Last Rate: 3 mL/hr at September 22, 2023 1000, 3 mg/hr at 09-22-2023 1000   midazolam  (VERSED ) bolus via infusion 0-5 mg, 0-5 mg, Intravenous, Q1H PRN, Josiah Nigh, MD, 2 mg at 09/12/23 2258   mupirocin  ointment (BACTROBAN ) 2 %, , Nasal, BID, Joesph Mussel, DO, Given at 2023/09/22 4696   norepinephrine  (LEVOPHED ) 16 mg in (0.064 mg/mL) premix  infusion, 0-40 mcg/min, Intravenous, Titrated, Josiah Nigh, MD, Last Rate: 2.81 mL/hr at 10/09/23 0100, 3 mcg/min at 09-Oct-2023 0100   ondansetron  (ZOFRAN ) injection 4 mg, 4 mg, Intravenous, Q6H PRN, Josiah Nigh, MD   Oral care mouth rinse, 15 mL, Mouth Rinse, Q2H, Dezii, Alexandra, DO, 15 mL at 10/09/2023 1610   Oral care mouth rinse, 15 mL, Mouth Rinse, PRN, Dezii, Alexandra, DO   oxyCODONE  (Oxy IR/ROXICODONE ) immediate release tablet 5 mg, 5 mg, Per Tube, Q4H PRN, Josiah Nigh, MD, 5 mg at 09/13/23 1000   pantoprazole  (PROTONIX ) injection 40 mg, 40 mg, Intravenous, Q12H, Prosperi, Christian H, PA-C, 40 mg at 2023/10/09 9604   propofol  (DIPRIVAN ) 1000 MG/100ML infusion, 5-80 mcg/kg/min, Intravenous, Continuous, Paliwal, Aditya, MD, Last Rate: 17.57 mL/hr at 10-09-2023 1000, 30 mcg/kg/min at  Oct 09, 2023 1000   rocuronium  (ZEMURON ) injection 80 mg, 80 mg, Intravenous, Q1H PRN, Josiah Nigh, MD, 80 mg at 09/13/23 0800   terbinafine  (LAMISIL ) 1 % cream, , Topical, BID, Hadley Leu, NP, Given at 10/09/2023 0932  Vitals   Vitals:   10-09-23 0945 10-09-2023 1000 10-09-23 1015 09-Oct-2023 1030  BP: (!) 140/55 (!) 151/58 (!) 147/54 (!) 156/54  Pulse: 88 88 87 88  Resp: (!) 30 (!) 30 (!) 30 (!) 30  Temp:      TempSrc:      SpO2: 96% 100% 100% 100%  Weight:      Height:        Body mass index is 12.8 kg/m.  Physical Exam  General: Sedated and intubated.  Sedation with propofol  was held with the exam but he is also on fentanyl  and Versed  drips. HEENT: Normocephalic atraumatic Lungs: Vented Neurological exam Comfortably laying in bed No spontaneous movement No response to voice Nonverbal Cranial nerves: Pupils are equal round reactive to light sluggishly, corneal reflexes are present, does not blink to threat from either side, facial symmetry difficult to ascertain. Motor examination reveals low tone in all 4 extremities with loss of muscle bulk and no spontaneous movement and no withdrawal to noxious stimulation. Sensory exam: As above  Labs/Imaging/Neurodiagnostic studies   CBC:  Recent Labs  Lab 2023/10/08 0359 2023-10-09 0411  WBC 19.2* 15.3*  HGB 8.3* 8.1*  HCT 25.7* 25.8*  MCV 92.8 94.2  PLT 89* 74*   Basic Metabolic Panel:  Lab Results  Component Value Date   NA 136 2023/10/09   K 5.0 10-09-2023   CO2 25 10/09/23   GLUCOSE 142 (H) 09-Oct-2023   BUN 36 (H) October 09, 2023   CREATININE 1.45 (H) October 09, 2023   CALCIUM 8.5 (L) 10/09/23   GFRNONAA 52 (L) 09-Oct-2023   GFRAA >60 02/03/2016   HgbA1c:  Lab Results  Component Value Date   HGBA1C 4.3 (L) 07/14/2023   Urine Drug Screen:     Component Value Date/Time   LABOPIA NONE DETECTED 02/03/2016 1936   COCAINSCRNUR NONE DETECTED 02/03/2016 1936   LABBENZ POSITIVE (A) 02/03/2016 1936   AMPHETMU NONE  DETECTED 02/03/2016 1936   THCU NONE DETECTED 02/03/2016 1936   LABBARB NONE DETECTED 02/03/2016 1936    Alcohol  Level     Component Value Date/Time   ETH <5 02/03/2016 1708   INR  Lab Results  Component Value Date   INR 1.3 (H) 09/11/2023   APTT  Lab Results  Component Value Date   APTT 32 08/13/2023    MRI Brain(Personally reviewed): MRI brain from 2023-10-09 compared to 06/21/2023 with innumerable chronic microhemorrhages in a mixed central and  peripheral distribution markedly worsened since 06/21/2023.  ASSESSMENT   Marios Yellowhair is a 68 y.o. male who is currently admitted in the critical care unit where he initially came to the hospital for evaluation of a GI bleed and then had difficulty with his tracheostomy and difficulty with bleeding along with a PEA arrest on 09/02/2023 requiring 3 minutes of CPR. His brain imaging reveals innumerable chronic microhemorrhages that have progressed rapidly since October of last year. The scan of a pattern is usually seen in the setting of frank coagulopathy, hypertension etc. 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 His overall medical condition and exam from both general medical standpoint and neurological standpoint is extremely poor. Given the chronicity of his condition and the course during this admission, I do not think that he would have a good chance for neurologically meaningful recovery. I have relayed this clearly to the family.   RECOMMENDATIONS  I had detailed discussion with his wife at bedside and his sister Alexandria Ida over the phone. I went over the images on the screen from his MRI with his wife and showed her the comparison images from October delineating where the abnormalities are. I discussed with them that his overall clinical  picture appears to be associated with extremely grim prognosis for meaningful recovery and his neurological exam and imaging also does not bode well with any reasonable chance of neurological meaningful recovery. They asked me questions on whether progressing to comfort measures only would be a good step and I told them that I think that would be an appropriate next step.  I discussed my plan with Dr. Fulton Job over the phone as well.  Inpatient neurology will be available as needed.  Please do not hesitate to call me.  ______________________________________________________________________    Signed, Tona Francis, MD [facility]   CRITICAL CARE ATTESTATION Performed by: Tona Francis, MD Total critical care time: 60 minutes Critical care time was exclusive of separately billable procedures and treating other patients and/or supervising APPs/Residents/Students Critical care was necessary to treat or prevent imminent or life-threatening deterioration. This patient is critically ill and at significant risk for neurological worsening and/or death and care requires constant monitoring. Critical care was time spent personally by me on the following activities: development of treatment plan with patient and/or surrogate as well as nursing, discussions with consultants, evaluation of patient's response to treatment, examination of patient, obtaining history from patient or surrogate, ordering and performing treatments and interventions, ordering and review of laboratory studies, ordering and review of radiographic studies, pulse oximetry, re-evaluation of patient's condition, participation in multidisciplinary rounds and medical decision making of high complexity in the care of this patient.

## 2023-10-02 NOTE — Progress Notes (Cosign Needed)
 Wasted of fentanyl  with Marsh Skeans RN in stericycle.

## 2023-10-02 NOTE — Progress Notes (Signed)
 Discussed with wife again regarding DDAVP  & TXA-- she agrees to proceed, understanding the clotting risk, which is less imminent than the ongoing constant risk of his trach/ airway clotting issues that could limit ventilation.  Joesph Mussel, DO 20-Sep-2023 9:01 AM [facility]  For contact information, see Amion. If no response to pager, please call PCCM consult pager. After hours, 7PM- 7AM, please call Elink.

## 2023-10-02 NOTE — Procedures (Signed)
 Extubation Procedure Note  Patient Details:   Name: Joshua Eaton DOB: [birth year] MRN: [id number]   Airway Documentation:    Vent end date: 10-09-23 Vent end time: 1725   Evaluation  O2 sats: transiently fell during during procedure and currently acceptable Complications: No apparent complications Patient did tolerate procedure well. Bilateral Breath Sounds: Diminished, Rhonchi   No  Pt removed from vent per comfort care measures per physician order/family request. Prior to pt suctioned via trach and orally without complication.Isabella Mao remains in place with cuff inflated per MD verbal order and vent removed at this time. Pt wife and additional family member remain at bedside. RT will continue to be available as needed.    Joselyn Nicely Ethelle Ola 2023/10/09, 5:30 PM

## 2023-10-02 DEATH — deceased

## 2023-10-03 LAB — CULTURE, FUNGUS WITHOUT SMEAR: Culture: NO GROWTH

## 2023-10-06 ENCOUNTER — Other Ambulatory Visit: Payer: Self-pay | Admitting: Psychiatry

## 2023-10-06 DIAGNOSIS — R251 Tremor, unspecified: Secondary | ICD-10-CM

## 2023-10-12 MED FILL — Medication: Qty: 1 | Status: AC

## 2023-10-15 LAB — ACID FAST CULTURE WITH REFLEXED SENSITIVITIES (MYCOBACTERIA): Acid Fast Culture: NEGATIVE
